# Patient Record
Sex: Female | Born: 1935 | Race: White | Hispanic: No | Smoking: Never smoker
Health system: Southern US, Community
[De-identification: ages and names within clinical notes are randomized; demographics above are authoritative.]

## PROBLEM LIST (undated history)

## (undated) DIAGNOSIS — M1990 Unspecified osteoarthritis, unspecified site: Secondary | ICD-10-CM

## (undated) DIAGNOSIS — E079 Disorder of thyroid, unspecified: Secondary | ICD-10-CM

## (undated) DIAGNOSIS — I10 Essential (primary) hypertension: Secondary | ICD-10-CM

## (undated) DIAGNOSIS — IMO0002 Reserved for concepts with insufficient information to code with codable children: Secondary | ICD-10-CM

## (undated) DIAGNOSIS — G4730 Sleep apnea, unspecified: Secondary | ICD-10-CM

## (undated) HISTORY — PX: ABDOMINAL HYSTERECTOMY: SHX81

## (undated) HISTORY — DX: Reserved for concepts with insufficient information to code with codable children: IMO0002

## (undated) HISTORY — DX: Unspecified osteoarthritis, unspecified site: M19.90

## (undated) HISTORY — DX: Disorder of thyroid, unspecified: E07.9

## (undated) HISTORY — DX: Essential (primary) hypertension: I10

---

## 2004-10-16 ENCOUNTER — Ambulatory Visit: Payer: Self-pay | Admitting: Ophthalmology

## 2004-11-24 ENCOUNTER — Ambulatory Visit: Payer: Self-pay | Admitting: Family Medicine

## 2004-12-01 ENCOUNTER — Ambulatory Visit: Payer: Self-pay

## 2004-12-07 ENCOUNTER — Ambulatory Visit: Payer: Self-pay | Admitting: Ophthalmology

## 2005-09-12 ENCOUNTER — Ambulatory Visit: Payer: Self-pay | Admitting: Family Medicine

## 2008-04-01 ENCOUNTER — Ambulatory Visit: Payer: Self-pay | Admitting: Internal Medicine

## 2008-06-03 ENCOUNTER — Ambulatory Visit: Payer: Self-pay | Admitting: Internal Medicine

## 2008-07-15 ENCOUNTER — Ambulatory Visit: Payer: Self-pay | Admitting: Family

## 2008-07-23 ENCOUNTER — Ambulatory Visit: Payer: Self-pay | Admitting: Internal Medicine

## 2010-12-24 HISTORY — PX: VITRECTOMY: SHX106

## 2011-02-22 ENCOUNTER — Ambulatory Visit (INDEPENDENT_AMBULATORY_CARE_PROVIDER_SITE_OTHER): Payer: Medicare Other | Admitting: Internal Medicine

## 2011-02-22 ENCOUNTER — Encounter: Payer: Self-pay | Admitting: Internal Medicine

## 2011-02-22 VITALS — BP 148/82 | HR 87 | Temp 97.5°F | Resp 16 | Ht 61.0 in | Wt 166.5 lb

## 2011-02-22 DIAGNOSIS — R5383 Other fatigue: Secondary | ICD-10-CM

## 2011-02-22 DIAGNOSIS — E785 Hyperlipidemia, unspecified: Secondary | ICD-10-CM

## 2011-02-22 DIAGNOSIS — E039 Hypothyroidism, unspecified: Secondary | ICD-10-CM

## 2011-02-22 DIAGNOSIS — I10 Essential (primary) hypertension: Secondary | ICD-10-CM | POA: Insufficient documentation

## 2011-02-22 MED ORDER — LEVOTHYROXINE SODIUM 25 MCG PO TABS: 25.0000 ug | ORAL_TABLET | Freq: Every day | ORAL | Status: DC

## 2011-02-22 NOTE — Assessment & Plan Note (Signed)
She is not tolerating current Synthroid dose.  Will decrease dose to 25 mcg and recheck level in 6 weeks

## 2011-02-22 NOTE — Progress Notes (Signed)
  Subjective:    Patient ID: Lauren Morris, female    DOB: May 01, 1932, 77 y.o.   MRN: [id number]  HPI 77 yo female with history of low thyroid and hypertension presents for /fu on chronic issues .  During her annual phsicial 4 months ago she was diagnosed with hypothyroidism and started on replacement therapy with Synthroid 50 mcg.  Repeat TSH was normalized, but she is reporting increased stooling and daily hot flashes since starting the thyroid medication and wonders if it is too strong for her.   Current outpatient prescriptions:levothyroxine (SYNTHROID, LEVOTHROID) 25 MCG tablet, Take 1 tablet (25 mcg total) by mouth daily., Disp: 30 tablet, Rfl: 3;  amLODipine (NORVASC) 5 MG tablet, Take 5 mg by mouth daily.  , Disp: , Rfl: ;  Calcium Carb-Cholecalciferol (CALCIUM 1000 + D PO), Take 1 tablet by mouth daily.  , Disp: , Rfl: ;  Cholecalciferol (VITAMIN D) 1000 UNITS capsule, Take 1,000 Units by mouth daily.  , Disp: , Rfl:  fish oil-omega-3 fatty acids 1000 MG capsule, Take 2 g by mouth daily.  , Disp: , Rfl:    Review of Systems  Constitutional: Negative for fever, chills and unexpected weight change.  HENT: Negative for hearing loss, ear pain, nosebleeds, congestion, sore throat, facial swelling, rhinorrhea, sneezing, mouth sores, trouble swallowing, neck pain, neck stiffness, voice change, postnasal drip, sinus pressure, tinnitus and ear discharge.   Eyes: Positive for pain and redness. Negative for discharge and visual disturbance.  Respiratory: Negative for cough, chest tightness, shortness of breath, wheezing and stridor.   Cardiovascular: Negative for chest pain, palpitations and leg swelling.  Musculoskeletal: Negative for myalgias and arthralgias.  Skin: Negative for color change and rash.  Neurological: Negative for dizziness, weakness, light-headedness and headaches.  Hematological: Negative for adenopathy.     BP 148/82  Pulse 87  Temp(Src) 97.5 F (36.4 C) (Oral)  Resp 16  Ht  5\' 1"  (1.549 m)  Wt 166 lb 8 oz (75.524 kg)  BMI 31.46 kg/m2  SpO2 96%   Objective:   Physical Exam  Constitutional: She is oriented to person, place, and time. She appears well-developed and well-nourished.  HENT:  Mouth/Throat: Oropharynx is clear and moist.  Eyes: EOM are normal. Pupils are equal, round, and reactive to light. No scleral icterus.  Neck: Normal range of motion. Neck supple. No JVD present. No thyromegaly present.  Cardiovascular: Normal rate, regular rhythm, normal heart sounds and intact distal pulses.   Pulmonary/Chest: Effort normal and breath sounds normal.  Abdominal: Soft. Bowel sounds are normal. She exhibits no mass. There is no tenderness.  Musculoskeletal: Normal range of motion. She exhibits no edema.  Lymphadenopathy:    She has no cervical adenopathy.  Neurological: She is alert and oriented to person, place, and time.  Skin: Skin is warm and dry.  Psychiatric: She has a normal mood and affect.          Assessment & Plan:

## 2011-02-22 NOTE — Assessment & Plan Note (Signed)
Elevated today, but hse has been recently treated with steroids for her eye issues.  Will have her check bps at home for the next 2 weeks and increase amlodipine to 10 mg daily if bps are > 130/80

## 2011-02-22 NOTE — Patient Instructions (Addendum)
Measure your bp once daily for one week.  Call results to the office..  If most of your readings are over 130/80,  We will increase your amlodipine dose to 10 mg daily . We are reducing your thyroid pill to 25 mcg daily  And will repeat your thyroid level in about 6 weeks.

## 2011-02-28 ENCOUNTER — Telehealth: Payer: Self-pay | Admitting: Internal Medicine

## 2011-02-28 NOTE — Telephone Encounter (Signed)
Patient called and stated you asked her to increase her amlodipine to 10 mg daily and check her BP for 5 days.  She stated the readings have been:  130/67    140/62   135/68   132/67 and  130/65.  She said she has one more days worth of medication and wanted to know if you want her to continue with the 10 mg daily.  Please advise.

## 2011-03-01 MED ORDER — AMLODIPINE BESYLATE 10 MG PO TABS: 10.0000 mg | ORAL_TABLET | Freq: Every day | ORAL | Status: DC

## 2011-03-01 NOTE — Telephone Encounter (Signed)
Rx has been called in. And patient notified.

## 2011-03-01 NOTE — Telephone Encounter (Signed)
Yes, have her contineu amlodipine 10 mg daily   You can refill #30 with 5 refills

## 2011-04-12 ENCOUNTER — Encounter: Payer: Self-pay | Admitting: Internal Medicine

## 2011-04-12 ENCOUNTER — Ambulatory Visit (INDEPENDENT_AMBULATORY_CARE_PROVIDER_SITE_OTHER): Payer: Medicare Other | Admitting: Internal Medicine

## 2011-04-12 DIAGNOSIS — R609 Edema, unspecified: Secondary | ICD-10-CM

## 2011-04-12 DIAGNOSIS — R5383 Other fatigue: Secondary | ICD-10-CM

## 2011-04-12 DIAGNOSIS — I10 Essential (primary) hypertension: Secondary | ICD-10-CM

## 2011-04-12 DIAGNOSIS — E039 Hypothyroidism, unspecified: Secondary | ICD-10-CM

## 2011-04-12 DIAGNOSIS — R5381 Other malaise: Secondary | ICD-10-CM

## 2011-04-12 DIAGNOSIS — E669 Obesity, unspecified: Secondary | ICD-10-CM

## 2011-04-12 DIAGNOSIS — E785 Hyperlipidemia, unspecified: Secondary | ICD-10-CM

## 2011-04-12 LAB — TSH: TSH: 1.42 u[IU]/mL (ref 0.35–5.50)

## 2011-04-12 LAB — COMPREHENSIVE METABOLIC PANEL
ALT: 38 U/L — ABNORMAL HIGH (ref 0–35)
BUN: 13 mg/dL (ref 6–23)
CO2: 29 mEq/L (ref 19–32)
Calcium: 9.4 mg/dL (ref 8.4–10.5)
Creatinine, Ser: 0.7 mg/dL (ref 0.4–1.2)
GFR: 86.28 mL/min (ref >60.00)
Total Bilirubin: 0.5 mg/dL (ref 0.3–1.2)

## 2011-04-12 LAB — LIPID PANEL
HDL: 36.1 mg/dL — ABNORMAL LOW (ref >39.00)
Triglycerides: 174 mg/dL — ABNORMAL HIGH (ref 0.0–149.0)
VLDL: 34.8 mg/dL (ref 0.0–40.0)

## 2011-04-12 NOTE — Patient Instructions (Signed)
We will recheck your thyroid  And cholesterol today.  Your blood pressure is excellent.  You may use the furosemide as needed (not more than once daily) for fluid retention.  It is a diuretic

## 2011-04-13 ENCOUNTER — Telehealth: Payer: Self-pay | Admitting: Internal Medicine

## 2011-04-13 MED ORDER — FUROSEMIDE 20 MG PO TABS: 20.0000 mg | ORAL_TABLET | Freq: Every day | ORAL | Status: DC

## 2011-04-13 NOTE — Telephone Encounter (Signed)
Left pt VM that Rx was sent in today and I resent RX

## 2011-04-13 NOTE — Telephone Encounter (Signed)
PT CALLED IN  CHECK ON RX THAT WAS TO BE CALLED IN YESTERDAY BY DR TULLO.   PT WOULD LIKE TO PICK BEFORE 1 TODAY [facility]

## 2011-04-13 NOTE — Telephone Encounter (Signed)
My note indicates that furosemide was e prescribed to her pharmacy.  Can you call it in?  20 mg one tablet daily as needed for fluid retention.  #30 3 refills

## 2011-04-13 NOTE — Telephone Encounter (Signed)
Patient says that she was to have a rx called in yesterday at her appt. I tried calling patient back to find out what rx, but got no answer.

## 2011-04-13 NOTE — Telephone Encounter (Signed)
Patient states pharmacy does not have Rx °

## 2011-04-13 NOTE — Telephone Encounter (Signed)
Rx sent to pharmacy   

## 2011-04-15 ENCOUNTER — Encounter: Payer: Self-pay | Admitting: Internal Medicine

## 2011-04-15 DIAGNOSIS — E785 Hyperlipidemia, unspecified: Secondary | ICD-10-CM | POA: Insufficient documentation

## 2011-04-15 MED ORDER — FUROSEMIDE 20 MG PO TABS: 20.0000 mg | ORAL_TABLET | Freq: Every day | ORAL | Status: DC | PRN

## 2011-04-15 NOTE — Assessment & Plan Note (Signed)
currently well controlled.  No changes today

## 2011-04-15 NOTE — Progress Notes (Signed)
  Subjective:    Patient ID: Lauren Morris, female    DOB: [birth year], 77 y.o.   MRN: [id number]  HPI  77 yr old white female returns for recheck after reducing her thyroid medication dose due to increased defecation and hot flahses in the setting of supratherapeutic TSH.  She contineus to have epsiodes of hot flashes, though not as frequently,  And her bowels have returned to normal.  Denies tremor, unintentional weight loss, tachycardia and hair loss.   Review of Systems  Constitutional: Negative for fever, chills and unexpected weight change.  HENT: Negative for hearing loss, ear pain, nosebleeds, congestion, sore throat, facial swelling, rhinorrhea, sneezing, mouth sores, trouble swallowing, neck pain, neck stiffness, voice change, postnasal drip, sinus pressure, tinnitus and ear discharge.   Eyes: Negative for pain, discharge, redness and visual disturbance.  Respiratory: Negative for cough, chest tightness, shortness of breath, wheezing and stridor.   Cardiovascular: Negative for chest pain, palpitations and leg swelling.  Musculoskeletal: Negative for myalgias and arthralgias.  Skin: Negative for color change and rash.  Neurological: Negative for dizziness, weakness, light-headedness and headaches.  Hematological: Negative for adenopathy.       Objective:   Physical Exam  Constitutional: She is oriented to person, place, and time. She appears well-developed and well-nourished.  HENT:  Mouth/Throat: Oropharynx is clear and moist.  Eyes: EOM are normal. Pupils are equal, round, and reactive to light. No scleral icterus.  Neck: Normal range of motion. Neck supple. No JVD present. No thyromegaly present.  Cardiovascular: Normal rate, regular rhythm, normal heart sounds and intact distal pulses.   Pulmonary/Chest: Effort normal and breath sounds normal.  Abdominal: Soft. Bowel sounds are normal. She exhibits no mass. There is no tenderness.  Musculoskeletal: Normal range of motion. She  exhibits no edema.  Lymphadenopathy:    She has no cervical adenopathy.  Neurological: She is alert and oriented to person, place, and time.  Skin: Skin is warm and dry.  Psychiatric: She has a normal mood and affect.          Assessment & Plan:

## 2011-04-15 NOTE — Assessment & Plan Note (Signed)
Repeat TSH is normalized and she feels better on lower dose of generic Synthroid

## 2011-04-15 NOTE — Assessment & Plan Note (Signed)
LDL is within goal.  triglycerides are mildly elevated, which I have addressed with recommendations for weight loss and exercise.  Recommended low glycemic index diet.

## 2011-06-25 ENCOUNTER — Other Ambulatory Visit: Payer: Self-pay | Admitting: *Deleted

## 2011-06-25 MED ORDER — LEVOTHYROXINE SODIUM 25 MCG PO TABS: 25.0000 ug | ORAL_TABLET | Freq: Every day | ORAL | Status: DC

## 2011-06-25 NOTE — Telephone Encounter (Signed)
Faxed request from [facility], last filled 05/21/11.

## 2011-08-06 DIAGNOSIS — H35369 Drusen (degenerative) of macula, unspecified eye: Secondary | ICD-10-CM | POA: Diagnosis not present

## 2011-08-06 DIAGNOSIS — H35379 Puckering of macula, unspecified eye: Secondary | ICD-10-CM | POA: Diagnosis not present

## 2011-08-06 DIAGNOSIS — H43819 Vitreous degeneration, unspecified eye: Secondary | ICD-10-CM | POA: Diagnosis not present

## 2011-08-16 DIAGNOSIS — H409 Unspecified glaucoma: Secondary | ICD-10-CM | POA: Diagnosis not present

## 2011-08-16 DIAGNOSIS — H4011X0 Primary open-angle glaucoma, stage unspecified: Secondary | ICD-10-CM | POA: Diagnosis not present

## 2011-08-22 ENCOUNTER — Telehealth: Payer: Self-pay | Admitting: Internal Medicine

## 2011-08-22 MED ORDER — LEVOTHYROXINE SODIUM 25 MCG PO TABS: 25.0000 ug | ORAL_TABLET | Freq: Every day | ORAL | Status: DC

## 2011-08-22 NOTE — Telephone Encounter (Signed)
Rx has been called in  

## 2011-08-22 NOTE — Telephone Encounter (Signed)
[phone] Pt called to get refill on her levothyroxine 25mg  1 daily [facility] in [place]

## 2011-08-28 ENCOUNTER — Telehealth: Payer: Self-pay | Admitting: Internal Medicine

## 2011-08-28 MED ORDER — AMLODIPINE BESYLATE 10 MG PO TABS: 10.0000 mg | ORAL_TABLET | Freq: Every day | ORAL | Status: DC

## 2011-08-28 NOTE — Telephone Encounter (Signed)
Patient needs her amlodopine called into [facility].

## 2011-08-28 NOTE — Telephone Encounter (Signed)
Rx has been called in  

## 2011-08-30 DIAGNOSIS — J209 Acute bronchitis, unspecified: Secondary | ICD-10-CM | POA: Diagnosis not present

## 2011-09-05 ENCOUNTER — Encounter: Payer: Self-pay | Admitting: Internal Medicine

## 2011-09-05 ENCOUNTER — Ambulatory Visit (INDEPENDENT_AMBULATORY_CARE_PROVIDER_SITE_OTHER): Payer: Medicare Other | Admitting: Internal Medicine

## 2011-09-05 VITALS — BP 132/64 | HR 76 | Temp 97.6°F | Resp 16 | Ht 62.0 in | Wt 188.5 lb

## 2011-09-05 DIAGNOSIS — I10 Essential (primary) hypertension: Secondary | ICD-10-CM

## 2011-09-05 DIAGNOSIS — R609 Edema, unspecified: Secondary | ICD-10-CM

## 2011-09-05 DIAGNOSIS — E785 Hyperlipidemia, unspecified: Secondary | ICD-10-CM

## 2011-09-05 DIAGNOSIS — E039 Hypothyroidism, unspecified: Secondary | ICD-10-CM | POA: Diagnosis not present

## 2011-09-05 DIAGNOSIS — Z79899 Other long term (current) drug therapy: Secondary | ICD-10-CM

## 2011-09-05 DIAGNOSIS — J0100 Acute maxillary sinusitis, unspecified: Secondary | ICD-10-CM

## 2011-09-05 MED ORDER — FUROSEMIDE 20 MG PO TABS: 20.0000 mg | ORAL_TABLET | Freq: Every day | ORAL | Status: DC | PRN

## 2011-09-05 NOTE — Patient Instructions (Addendum)
Your symptoms are more likely from a viral infection(not allergies)   that caused a bacterial sinus infection and bronchitis   Use benadryl (dipenhydramine)  25 mg every 8 hours,  And sudafed PE  10 to 20 mg every 8  Hours until 3 PM ( last dose at 3 Pm so it does not keep you awake at night) for a few days. Until your symptoms improve   Lauren Morris will work on the runny nose better than Allegra if the cause is a virus   Sudafed PE takes care of the congestion.   You may also try simply Saline nasal spray to flush your sinuses twice daily  (AM and PM)  Finish the levaquin

## 2011-09-05 NOTE — Progress Notes (Signed)
Subjective:    Patient ID: Lauren Morris, female    DOB: 04/25/1932, 78 y.o.   MRN: [id number]  HPI   Lauren Morris is a 78 yr old white female with a history of hypertension and  hypothyroidisim who presents for follow up.PShe was treated for sinusitis/ bronchitis last Thursday by Urgent Care With levaquin,  Prometh/DM cough syrup but has taken the recommended decongestants because her pharmacist told her it would raise her bp.  She feels generally better but conitnues  To have a cough and sinus pressure in all 4 areas.  Past Medical History  Diagnosis Date  . Glaucoma   . Hypertension   . Thyroid disease    Current Outpatient Prescriptions on File Prior to Visit  Medication Sig Dispense Refill  . amLODipine (NORVASC) 10 MG tablet Take 1 tablet (10 mg total) by mouth daily.  30 tablet  5  . Calcium Carb-Cholecalciferol (CALCIUM 1000 + D PO) Take 1 tablet by mouth daily.        . Cholecalciferol (VITAMIN D) 1000 UNITS capsule Take 1,000 Units by mouth daily.        . fish oil-omega-3 fatty acids 1000 MG capsule Take 2 g by mouth daily.        Marland Kitchen levothyroxine (SYNTHROID, LEVOTHROID) 25 MCG tablet Take 1 tablet (25 mcg total) by mouth daily.  30 tablet  4    Review of Systems  Constitutional: Negative for fever, chills and unexpected weight change.  HENT: Positive for sinus pressure. Negative for hearing loss, ear pain, nosebleeds, congestion, sore throat, facial swelling, rhinorrhea, sneezing, mouth sores, trouble swallowing, neck pain, neck stiffness, voice change, postnasal drip, tinnitus and ear discharge.   Eyes: Negative for pain, discharge, redness and visual disturbance.  Respiratory: Negative for cough, chest tightness, shortness of breath, wheezing and stridor.   Cardiovascular: Negative for chest pain, palpitations and leg swelling.  Musculoskeletal: Negative for myalgias and arthralgias.  Skin: Negative for color change and rash.  Neurological: Negative for dizziness, weakness,  light-headedness and headaches.  Hematological: Negative for adenopathy.       Objective:   Physical Exam  Constitutional: She is oriented to person, place, and time. She appears well-developed and well-nourished.  HENT:  Mouth/Throat: Oropharynx is clear and moist.  Eyes: EOM are normal. Pupils are equal, round, and reactive to light. No scleral icterus.  Neck: Normal range of motion. Neck supple. No JVD present. No thyromegaly present.  Cardiovascular: Normal rate, regular rhythm, normal heart sounds and intact distal pulses.   Pulmonary/Chest: Effort normal and breath sounds normal.  Abdominal: Soft. Bowel sounds are normal. She exhibits no mass. There is no tenderness.  Musculoskeletal: Normal range of motion. She exhibits no edema.  Lymphadenopathy:    She has no cervical adenopathy.  Neurological: She is alert and oriented to person, place, and time.  Skin: Skin is warm and dry.  Psychiatric: She has a normal mood and affect.      Assessment & Plan:   Sinusitis: recommended adding sudafed PE, Benadryl and simply saline for relief of symptoms .  Hypertension Well controlled on current medications.  No changes today.  Hyperlipidemia LDL goal <100 LDL was 62 last visit on current therapy.  LFTS needed   Hypothyroidism TSh was therapeutic on current synthroid   dose 6 months ago    Updated Medication List Outpatient Encounter Prescriptions as of 09/05/2011  Medication Sig Dispense Refill  . amLODipine (NORVASC) 10 MG tablet Take 1 tablet (10 mg  total) by mouth daily.  30 tablet  5  . Calcium Carb-Cholecalciferol (CALCIUM 1000 + D PO) Take 1 tablet by mouth daily.        . Cholecalciferol (VITAMIN D) 1000 UNITS capsule Take 1,000 Units by mouth daily.        . fish oil-omega-3 fatty acids 1000 MG capsule Take 2 g by mouth daily.        . furosemide (LASIX) 20 MG tablet Take 1 tablet (20 mg total) by mouth daily as needed (for fluid retention).  30 tablet  11  .  levothyroxine (SYNTHROID, LEVOTHROID) 25 MCG tablet Take 1 tablet (25 mcg total) by mouth daily.  30 tablet  4  . DISCONTD: furosemide (LASIX) 20 MG tablet Take 1 tablet (20 mg total) by mouth daily as needed (for fluid retention).  30 tablet  11  . DISCONTD: furosemide (LASIX) 20 MG tablet Take 1 tablet (20 mg total) by mouth daily as needed (for fluid retention).  30 tablet  11  . DISCONTD: furosemide (LASIX) 20 MG tablet Take 1 tablet (20 mg total) by mouth daily.  30 tablet  3

## 2011-09-05 NOTE — Assessment & Plan Note (Addendum)
LDL was 62 last visit on current therapy.  LFTS needed

## 2011-09-05 NOTE — Assessment & Plan Note (Signed)
Well controlled on current medications.  No changes today. 

## 2011-09-05 NOTE — Assessment & Plan Note (Signed)
TSh was therapeutic on current synthroid   dose 6 months ago

## 2011-09-06 ENCOUNTER — Telehealth: Payer: Self-pay | Admitting: Internal Medicine

## 2011-09-06 DIAGNOSIS — Z1239 Encounter for other screening for malignant neoplasm of breast: Secondary | ICD-10-CM

## 2011-09-06 NOTE — Telephone Encounter (Signed)
Thank,  rx on printer

## 2011-09-06 NOTE — Telephone Encounter (Signed)
Patient notified of order, she will pick up.

## 2011-09-06 NOTE — Telephone Encounter (Signed)
[phone] Pt called to let you know that her last mammogram was 12/14/10 @ [facility] She needs order for mammogram this year She will be going 7/6-7/13

## 2011-10-08 ENCOUNTER — Encounter: Payer: Self-pay | Admitting: Internal Medicine

## 2011-10-08 ENCOUNTER — Other Ambulatory Visit: Payer: Medicare Other

## 2011-10-22 ENCOUNTER — Other Ambulatory Visit (INDEPENDENT_AMBULATORY_CARE_PROVIDER_SITE_OTHER): Payer: Medicare Other | Admitting: *Deleted

## 2011-10-22 DIAGNOSIS — E785 Hyperlipidemia, unspecified: Secondary | ICD-10-CM

## 2011-10-22 DIAGNOSIS — Z79899 Other long term (current) drug therapy: Secondary | ICD-10-CM

## 2011-10-22 DIAGNOSIS — E039 Hypothyroidism, unspecified: Secondary | ICD-10-CM

## 2011-10-22 LAB — LIPID PANEL
Cholesterol: 136 mg/dL (ref 0–200)
HDL: 36 mg/dL — ABNORMAL LOW (ref >39.00)
LDL Cholesterol: 63 mg/dL (ref 0–99)
Total CHOL/HDL Ratio: 4
Triglycerides: 186 mg/dL — ABNORMAL HIGH (ref 0.0–149.0)
VLDL: 37.2 mg/dL (ref 0.0–40.0)

## 2011-10-23 LAB — COMPLETE METABOLIC PANEL WITH GFR
ALT: 33 U/L (ref 0–35)
AST: 32 U/L (ref 0–37)
Alkaline Phosphatase: 79 U/L (ref 39–117)
BUN: 15 mg/dL (ref 6–23)
Calcium: 9.9 mg/dL (ref 8.4–10.5)
Creat: 0.72 mg/dL (ref 0.50–1.10)
Total Bilirubin: 0.5 mg/dL (ref 0.3–1.2)

## 2011-12-20 DIAGNOSIS — Z1231 Encounter for screening mammogram for malignant neoplasm of breast: Secondary | ICD-10-CM | POA: Diagnosis not present

## 2011-12-26 ENCOUNTER — Other Ambulatory Visit: Payer: Self-pay | Admitting: *Deleted

## 2011-12-26 MED ORDER — LEVOTHYROXINE SODIUM 25 MCG PO TABS: 25.0000 ug | ORAL_TABLET | Freq: Every day | ORAL | Status: DC

## 2012-01-22 ENCOUNTER — Encounter: Payer: Self-pay | Admitting: Internal Medicine

## 2012-01-29 ENCOUNTER — Other Ambulatory Visit: Payer: Self-pay | Admitting: *Deleted

## 2012-01-29 MED ORDER — AMLODIPINE BESYLATE 10 MG PO TABS: 10.0000 mg | ORAL_TABLET | Freq: Every day | ORAL | Status: DC

## 2012-02-14 DIAGNOSIS — H409 Unspecified glaucoma: Secondary | ICD-10-CM | POA: Diagnosis not present

## 2012-02-14 DIAGNOSIS — H4011X0 Primary open-angle glaucoma, stage unspecified: Secondary | ICD-10-CM | POA: Diagnosis not present

## 2012-03-07 ENCOUNTER — Encounter: Payer: Medicare Other | Admitting: Internal Medicine

## 2012-03-11 ENCOUNTER — Encounter: Payer: Self-pay | Admitting: Internal Medicine

## 2012-03-11 ENCOUNTER — Ambulatory Visit (INDEPENDENT_AMBULATORY_CARE_PROVIDER_SITE_OTHER): Payer: Medicare Other | Admitting: Internal Medicine

## 2012-03-11 VITALS — BP 130/68 | HR 80 | Temp 98.1°F | Resp 16 | Wt 183.8 lb

## 2012-03-11 DIAGNOSIS — Z0000 Encounter for general adult medical examination without abnormal findings: Secondary | ICD-10-CM

## 2012-03-11 DIAGNOSIS — Z1239 Encounter for other screening for malignant neoplasm of breast: Secondary | ICD-10-CM | POA: Diagnosis not present

## 2012-03-11 DIAGNOSIS — Z7189 Other specified counseling: Secondary | ICD-10-CM | POA: Insufficient documentation

## 2012-03-11 NOTE — Assessment & Plan Note (Signed)
Breast exam was done today.  She is up to date on mammograms.

## 2012-03-11 NOTE — Patient Instructions (Addendum)
We did your annual Medicare Wellness exam today.  Please return in December for bloodwork (thyroid and liver/kidney function).  You do not need to fast.   I am sending you home with a stool test to check for blood i nyour stools.  We will do this annually until your next colonoscopy is due,

## 2012-03-11 NOTE — Progress Notes (Signed)
Patient ID: Lauren Morris, female   DOB: 10-31-1931, 78 y.o.   MRN: [id number] The patient is here for annual Medicare wellness examination and management of other chronic and acute problems.   The risk factors are reflected in the social history.  The roster of all physicians providing medical care to patient - is listed in the Snapshot section of the chart.  Activities of daily living:  The patient is 100% independent in all ADLs: dressing, toileting, feeding as well as independent mobility  Home safety : The patient has smoke detectors in the home. They wear seatbelts.  There are no firearms at home. There is no violence in the home.   There is no risks for hepatitis, STDs or HIV. There is no   history of blood transfusion. They have no travel history to infectious disease endemic areas of the world.  The patient has seen their dentist in the last six month. They have seen their eye doctor in the last year. They admit to slight hearing difficulty with regard to whispered voices and some television programs.  They have deferred audiologic testing in the last year.  They do not  have excessive sun exposure. Discussed the need for sun protection: hats, long sleeves and use of sunscreen if there is significant sun exposure.   Diet: the importance of a healthy diet is discussed. They do have a healthy diet.  The benefits of regular aerobic exercise were discussed. She walks 4 times per week ,  20 minutes.   Depression screen: there are no signs or vegative symptoms of depression- irritability, change in appetite, anhedonia, sadness/tearfullness.  Cognitive assessment: the patient manages all their financial and personal affairs and is actively engaged. They could relate day,date,year and events; recalled 2/3 objects at 3 minutes; performed clock-face test normally.  The following portions of the patient's history were reviewed and updated as appropriate: allergies, current medications, past family  history, past medical history,  past surgical history, past social history  and problem list.  Visual acuity was not assessed per patient preference since she has regular follow up with her ophthalmologist. Hearing and body mass index were assessed and reviewed.   During the course of the visit the patient was educated and counseled about appropriate screening and preventive services including : fall prevention , diabetes screening, nutrition counseling, colorectal cancer screening, and recommended immunizations.    Objective: BP 130/68  Pulse 80  Temp 98.1 F (36.7 C) (Oral)  Resp 16  Wt 183 lb 12 oz (83.348 kg)  SpO2 95%  General appearance: alert, cooperative and appears stated age Ears: normal TM's and external ear canals both ears Throat: lips, mucosa, and tongue normal; teeth and gums normal Neck: no adenopathy, no carotid bruit, supple, symmetrical, trachea midline and thyroid not enlarged, symmetric, no tenderness/mass/nodules Back: symmetric, no curvature. ROM normal. No CVA tenderness. Lungs: clear to auscultation bilaterally Breast: normal , nontender, symmetric Heart: regular rate and rhythm, S1, S2 normal, no murmur, click, rub or gallop Abdomen: soft, non-tender; bowel sounds normal; no masses,  no organomegaly Pulses: 2+ and symmetric Skin: Skin color, texture, turgor normal. No rashes or lesions Lymph nodes: Cervical, supraclavicular, and axillary nodes normal.

## 2012-04-07 DIAGNOSIS — Z23 Encounter for immunization: Secondary | ICD-10-CM | POA: Diagnosis not present

## 2012-04-17 ENCOUNTER — Other Ambulatory Visit: Payer: Medicare Other

## 2012-04-17 LAB — FECAL OCCULT BLOOD, IMMUNOCHEMICAL: Fecal Occult Bld: NEGATIVE

## 2012-04-19 DIAGNOSIS — R0982 Postnasal drip: Secondary | ICD-10-CM | POA: Diagnosis not present

## 2012-05-23 ENCOUNTER — Other Ambulatory Visit: Payer: Self-pay

## 2012-05-23 DIAGNOSIS — R609 Edema, unspecified: Secondary | ICD-10-CM

## 2012-05-23 MED ORDER — AMLODIPINE BESYLATE 10 MG PO TABS: 10.0000 mg | ORAL_TABLET | Freq: Every day | ORAL | Status: DC

## 2012-05-23 MED ORDER — FUROSEMIDE 20 MG PO TABS: 20.0000 mg | ORAL_TABLET | Freq: Every day | ORAL | Status: DC | PRN

## 2012-05-23 MED ORDER — LEVOTHYROXINE SODIUM 25 MCG PO TABS: 25.0000 ug | ORAL_TABLET | Freq: Every day | ORAL | Status: DC

## 2012-05-23 NOTE — Telephone Encounter (Signed)
Patient called request that her Rx be transfered to [facility] due to insurance and it had to be done by 05/25/12.

## 2012-05-30 ENCOUNTER — Telehealth: Payer: Self-pay | Admitting: *Deleted

## 2012-05-30 DIAGNOSIS — E039 Hypothyroidism, unspecified: Secondary | ICD-10-CM

## 2012-05-30 DIAGNOSIS — E785 Hyperlipidemia, unspecified: Secondary | ICD-10-CM

## 2012-05-30 DIAGNOSIS — Z79899 Other long term (current) drug therapy: Secondary | ICD-10-CM

## 2012-05-30 NOTE — Addendum Note (Signed)
Addended by: Sherlene Shams on: 05/30/2012 05:09 PM   Modules accepted: Orders

## 2012-05-30 NOTE — Telephone Encounter (Signed)
This patient has labs (Monday, Dec. 9th) would labs and diagnostic code would you like for this patient?

## 2012-05-30 NOTE — Telephone Encounter (Signed)
cmet fasting lipids and tsh  i will order

## 2012-06-02 ENCOUNTER — Other Ambulatory Visit (INDEPENDENT_AMBULATORY_CARE_PROVIDER_SITE_OTHER): Payer: Medicare Other

## 2012-06-02 DIAGNOSIS — E785 Hyperlipidemia, unspecified: Secondary | ICD-10-CM | POA: Diagnosis not present

## 2012-06-02 DIAGNOSIS — Z79899 Other long term (current) drug therapy: Secondary | ICD-10-CM | POA: Diagnosis not present

## 2012-06-02 DIAGNOSIS — E039 Hypothyroidism, unspecified: Secondary | ICD-10-CM

## 2012-06-02 LAB — COMPREHENSIVE METABOLIC PANEL
AST: 26 U/L (ref 0–37)
BUN: 14 mg/dL (ref 6–23)
Calcium: 9 mg/dL (ref 8.4–10.5)
Chloride: 103 mEq/L (ref 96–112)
Creatinine, Ser: 0.8 mg/dL (ref 0.4–1.2)
GFR: 72.69 mL/min (ref >60.00)

## 2012-06-02 LAB — LIPID PANEL
HDL: 28 mg/dL — ABNORMAL LOW (ref >39.00)
Total CHOL/HDL Ratio: 5
Triglycerides: 145 mg/dL (ref 0.0–149.0)

## 2012-07-04 LAB — HM DIABETES EYE EXAM: HM Diabetic Eye Exam: NORMAL

## 2012-07-14 ENCOUNTER — Ambulatory Visit: Payer: Medicare Other | Admitting: Internal Medicine

## 2012-07-17 ENCOUNTER — Ambulatory Visit: Payer: Medicare Other | Admitting: Internal Medicine

## 2012-07-23 ENCOUNTER — Ambulatory Visit: Payer: Medicare Other | Admitting: Internal Medicine

## 2012-07-31 DIAGNOSIS — H4011X0 Primary open-angle glaucoma, stage unspecified: Secondary | ICD-10-CM | POA: Diagnosis not present

## 2012-07-31 DIAGNOSIS — H353190 Nonexudative age-related macular degeneration, unspecified eye, stage unspecified: Secondary | ICD-10-CM | POA: Diagnosis not present

## 2012-08-04 ENCOUNTER — Encounter: Payer: Self-pay | Admitting: Internal Medicine

## 2012-08-04 ENCOUNTER — Ambulatory Visit (INDEPENDENT_AMBULATORY_CARE_PROVIDER_SITE_OTHER): Payer: Medicare Other | Admitting: Internal Medicine

## 2012-08-04 VITALS — BP 128/60 | HR 70 | Temp 97.4°F | Resp 16 | Wt 185.2 lb

## 2012-08-04 DIAGNOSIS — E8881 Metabolic syndrome: Secondary | ICD-10-CM | POA: Diagnosis not present

## 2012-08-04 DIAGNOSIS — R739 Hyperglycemia, unspecified: Secondary | ICD-10-CM

## 2012-08-04 DIAGNOSIS — R7309 Other abnormal glucose: Secondary | ICD-10-CM | POA: Diagnosis not present

## 2012-08-04 DIAGNOSIS — E1169 Type 2 diabetes mellitus with other specified complication: Secondary | ICD-10-CM | POA: Insufficient documentation

## 2012-08-04 DIAGNOSIS — E119 Type 2 diabetes mellitus without complications: Secondary | ICD-10-CM | POA: Insufficient documentation

## 2012-08-04 DIAGNOSIS — E88819 Insulin resistance, unspecified: Secondary | ICD-10-CM

## 2012-08-04 NOTE — Progress Notes (Addendum)
Patient ID: Lauren Morris, female   DOB: 06/21/1932, 79 y.o.   MRN: [id number]  Patient Active Problem List  Diagnosis  . Hypertension  . Hypothyroidism  . Hyperlipidemia LDL goal <100  . Routine general medical examination at a health care facility  . Insulin resistance    Subjective:  CC:   Chief Complaint  Patient presents with  . Follow-up    Blood Work     HPI:   Lauren Morris a 79 y.o. female who presents for Follow up on recent abnormal labs.  Fasting glucose of 124 ,  She has had her annual eye exam last week by [facility].  Pressure was up. New drop prescirbed.  Return on feb 20th  Concerned about starting medications for diabetes and is motivated to change her diet.  Denies frequent infections, polyuria, uninentional weight loss.    Past Medical History  Diagnosis Date  . Glaucoma   . Hypertension   . Thyroid disease   . Cyst     hx o on right breast  . DJD (degenerative joint disease)     right knee    Past Surgical History  Procedure Laterality Date  . Vitrectomy  July 2012    right eye with membrane peel [facility], [place])  . Abdominal hysterectomy         The following portions of the patient's history were reviewed and updated as appropriate: Allergies, current medications, and problem list.    Review of Systems:   12 Pt  review of systems was negative except those addressed in the HPI,     History   Social History  . Marital Status: Widowed    Spouse Name: N/A    Number of Children: N/A  . Years of Education: N/A   Occupational History  . Not on file.   Social History Main Topics  . Smoking status: Never Smoker   . Smokeless tobacco: Never Used  . Alcohol Use: No  . Drug Use: No  . Sexually Active: Not on file   Other Topics Concern  . Not on file   Social History Narrative  . No narrative on file    Objective:  BP 128/60  Pulse 70  Temp(Src) 97.4 F (36.3 C) (Oral)  Resp 16  Wt 185 lb 4 oz (84.029 kg)  BMI 33.87 kg/m2   SpO2 95%  General appearance: alert, cooperative and appears stated age Lungs: clear to auscultation bilaterally Heart: regular rate and rhythm, S1, S2 normal, no murmur, click, rub or gallop Abdomen: soft, non-tender; bowel sounds normal; no masses,  no organomegaly Pulses: 2+ and symmetric  Assessment and Plan:  Insulin resistance Low GI diet discussed along with need for regular exercise. Return in 3 months  A total of 25 minutes of face to face time , more than half of which was spent in in counselling patient.   Updated Medication List Outpatient Encounter Prescriptions as of 08/04/2012  Medication Sig Dispense Refill  . amLODipine (NORVASC) 10 MG tablet Take 1 tablet (10 mg total) by mouth daily.  30 tablet  5  . Calcium Carb-Cholecalciferol (CALCIUM 1000 + D PO) Take 1 tablet by mouth daily.        . Cholecalciferol (VITAMIN D) 1000 UNITS capsule Take 1,000 Units by mouth daily.        . fish oil-omega-3 fatty acids 1000 MG capsule Take 2 g by mouth daily.        . furosemide (LASIX) 20 MG tablet  Take 1 tablet (20 mg total) by mouth daily as needed (for fluid retention).  30 tablet  11  . levothyroxine (SYNTHROID, LEVOTHROID) 25 MCG tablet Take 1 tablet (25 mcg total) by mouth daily.  30 tablet  4  . timolol (BETIMOL) 0.25 % ophthalmic solution Place 1-2 drops into both eyes 2 (two) times daily.      . travoprost, benzalkonium, (TRAVATAN) 0.004 % ophthalmic solution Place 1 drop into both eyes at bedtime.       No facility-administered encounter medications on file as of 08/04/2012.     Orders Placed This Encounter  Procedures  . Hemoglobin A1c  . HM DIABETES EYE EXAM    Return in about 3 months (around 11/01/2012).

## 2012-08-04 NOTE — Assessment & Plan Note (Signed)
Low GI diet discussed along with need for regular exercise. Return in 3 months

## 2012-08-04 NOTE — Patient Instructions (Addendum)
You need to  Watch the sugar content of your foods since you are bordring on diabetes.    This is  my version of a  "Low GI"  Diet:  It is not ultra low carb, but will still lower your blood sugars and allow you to lose 5 to 10 lbs per month if you follow it carefully. All of the foods can be found at grocery stores and in bulk at [employer].  The Atkins protein bars and shakes are available in more varieties at [employer], [employer] and [employer].     7 AM Breakfast:  Low carbohydrate Protein  Shakes (I recommend the EAS AdvantEdge "Carb Control" shakes  Or the low carb shakes by Atkins.   Both are available everywhere:  In  cases at [employer]Js  Or in 4 packs at grocery stores and pharmacies  2.5 carbs  (Alternative is  a toasted Arnold's Sandwhich Thin w/ peanut butter, a "Bagel Thin" with cream cheese and salmon) or  a scrambled egg burrito made with a low carb tortilla .  Avoid corn based cereal and bananas, as well as instant oatmeal too unless you are cooking the old fashioned kind that takes 30-40 minutes to prepare.  the rest is overly processed, has minimal fiber, and is loaded with carbohydrates! Also try a hardboiled egg and a cup of Dannon Light n fit greek yogurt    10 AM: Protein bar by Atkins (the snack size, under 200 cal).  There are many varieties , available widely again or in bulk in limited varieties at BJs)  Other so called "protein bars" tend to be loaded with carbohydrates.  Remember, in food advertising, the word "energy" is synonymous for " carbohydrate."Or A CHEESE STICK  Or a handful of nuts   Lunch: sandwich of [place], (or any lunchmeat, grilled meat or canned tuna), fresh avocado, mayonnaise  and cheese on a lower carbohydrate pita bread, flatbread, or tortilla . Ok to use regular mayonnaise. The bread is the only source or carbohydrate that can be decreased (Joseph's makes a pita bread and a flat bread that are 50 cal and 4 net carbs ; Toufayan makes a low carb flatbread that's 100 cal  and 9 net carbs  and  [place] makes a low carb whole wheat tortilla  That is 210 cal and 6 net carbs) f you use regular bread,  do not have pretzels crackers or potato chips with it .  3 PM:  Mid day :  Another protein bar,  Or a  cheese stick (100 cal, 0 carbs),  Or 1 ounce of  almonds, walnuts, pistachios, pecans, peanuts,  Macadamia nuts. Or a Dannon light n Fit greek yogurt, 80 cal 8 net carbs . Avoid "granola"; the dried cranberries and raisins are loaded with carbohydrates. Mixed nuts ok if no raisins or cranberries or dried fruit.      6 PM  Dinner:  "mean and green:"  Meat/chicken/fish or a high protein legume; , with a green salad, and a low GI  Veggie (broccoli, cauliflower, green beans, spinach, brussel sprouts. Lima beans) : Avoid "Low fat dressings, as well as Reyne Dumas and [address]! They are loaded with sugar! Instead use ranch, vinagrette,  Blue cheese, etc.  There is a low carb pasta by Dreamfield's available at [facility] that is acceptable and tastes great. Try Michel Angel's chicken piccata over low carb pasta. The chicken dish is 0 carbs, and can be found in frozen section at  BJs and [employer]. Also try [employer]" (pulled pork, no sauce,  0 carbs) and his pot roast.   both are in the refrigerated section at BJs   Dreamfield's makes a low carb pasta only 5 g/serving.  Available at all grocery stores,  And tastes like normal pasta  9 PM snack : Breyer's "low carb" fudgsicle or  ice cream bar (Carb Smart line), or  Weight Watcher's ice cream bar , or another "no sugar added" ice cream;a serving of fresh berries/cherries with whipped cream (Avoid bananas, pineapple, grapes  and watermelon on a regular basis because they are high in sugar)   Remember that snack Substitutions should be less than 10 carbs per serving and meals should  < 20 carbs. Remember to subtract fiber grams and sugar alcohols to get the "net carbs."

## 2012-08-05 LAB — HEMOGLOBIN A1C: Hgb A1c MFr Bld: 6.2 % (ref 4.6–6.5)

## 2012-08-14 DIAGNOSIS — H4011X0 Primary open-angle glaucoma, stage unspecified: Secondary | ICD-10-CM | POA: Diagnosis not present

## 2012-08-14 DIAGNOSIS — H409 Unspecified glaucoma: Secondary | ICD-10-CM | POA: Diagnosis not present

## 2012-10-24 ENCOUNTER — Other Ambulatory Visit: Payer: Self-pay | Admitting: Internal Medicine

## 2012-11-12 ENCOUNTER — Ambulatory Visit (INDEPENDENT_AMBULATORY_CARE_PROVIDER_SITE_OTHER): Payer: Medicare Other | Admitting: Internal Medicine

## 2012-11-12 ENCOUNTER — Encounter: Payer: Self-pay | Admitting: Internal Medicine

## 2012-11-12 VITALS — BP 124/60 | HR 66 | Temp 97.5°F | Resp 16 | Wt 185.5 lb

## 2012-11-12 DIAGNOSIS — Z1239 Encounter for other screening for malignant neoplasm of breast: Secondary | ICD-10-CM

## 2012-11-12 DIAGNOSIS — E785 Hyperlipidemia, unspecified: Secondary | ICD-10-CM | POA: Diagnosis not present

## 2012-11-12 DIAGNOSIS — E88819 Insulin resistance, unspecified: Secondary | ICD-10-CM

## 2012-11-12 DIAGNOSIS — R7309 Other abnormal glucose: Secondary | ICD-10-CM | POA: Diagnosis not present

## 2012-11-12 DIAGNOSIS — E669 Obesity, unspecified: Secondary | ICD-10-CM

## 2012-11-12 DIAGNOSIS — R739 Hyperglycemia, unspecified: Secondary | ICD-10-CM

## 2012-11-12 DIAGNOSIS — I10 Essential (primary) hypertension: Secondary | ICD-10-CM

## 2012-11-12 DIAGNOSIS — E039 Hypothyroidism, unspecified: Secondary | ICD-10-CM

## 2012-11-12 DIAGNOSIS — E8881 Metabolic syndrome: Secondary | ICD-10-CM

## 2012-11-12 NOTE — Progress Notes (Signed)
Patient ID: Lauren Morris, female   DOB: 11-22-1931, 79 y.o.   MRN: [id number]   Patient Active Problem List   Diagnosis Date Noted  . Obesity, unspecified 11/13/2012  . Insulin resistance 08/04/2012  . Routine general medical examination at a health care facility 03/11/2012  . Hyperlipidemia LDL goal <100 04/15/2011  . Hypertension 02/22/2011  . Hypothyroidism 02/22/2011    Subjective:  CC:   Chief Complaint  Patient presents with  . Follow-up    DM and labs    HPI:   Lauren Morris a 79 y.o. female who presents for follow up on abnormal glucose.  She was advised to control the carbohydrates i n er diet but has had a difficult time.   Has been indulging in sweets.   Sugar free cookies.  Eats fruits and veggies regularly,.  No signs or symptoms of diabetes   Some allergic rhinitis symptoms without congestion or facila pain me allergic rhinits     Past Medical History  Diagnosis Date  . Glaucoma   . Hypertension   . Thyroid disease   . Cyst     hx o on right breast  . DJD (degenerative joint disease)     right knee    Past Surgical History  Procedure Laterality Date  . Vitrectomy  July 2012    right eye with membrane peel Lelon Perla, [place])  . Abdominal hysterectomy         The following portions of the patient's history were reviewed and updated as appropriate: Allergies, current medications, and problem list.    Review of Systems:   12 Pt  review of systems was negative except those addressed in the HPI,     History   Social History  . Marital Status: Widowed    Spouse Name: N/A    Number of Children: N/A  . Years of Education: N/A   Occupational History  . Not on file.   Social History Main Topics  . Smoking status: Never Smoker   . Smokeless tobacco: Never Used  . Alcohol Use: No  . Drug Use: No  . Sexually Active: Not on file   Other Topics Concern  . Not on file   Social History Narrative  . No narrative on file    Objective:  BP  124/60  Pulse 66  Temp(Src) 97.5 F (36.4 C) (Oral)  Resp 16  Wt 185 lb 8 oz (84.142 kg)  BMI 33.92 kg/m2  SpO2 98%  General appearance: alert, cooperative and appears stated age Ears: normal TM's and external ear canals both ears Throat: lips, mucosa, and tongue normal; teeth and gums normal Neck: no adenopathy, no carotid bruit, supple, symmetrical, trachea midline and thyroid not enlarged, symmetric, no tenderness/mass/nodules Back: symmetric, no curvature. ROM normal. No CVA tenderness. Lungs: clear to auscultation bilaterally Heart: regular rate and rhythm, S1, S2 normal, no murmur, click, rub or gallop Abdomen: soft, non-tender; bowel sounds normal; no masses,  no organomegaly Pulses: 2+ and symmetric Skin: Skin color, texture, turgor normal. No rashes or lesions Lymph nodes: Cervical, supraclavicular, and axillary nodes normal.  Assessment and Plan:  Insulin resistance I have recommended a low glycemic index diet utilizing smaller more frequent meals to increase metabolism.  I have also recommended that patient start exercising with a goal of 30 minutes of aerobic exercise a minimum of 5 days per week. Screening for lipid disorders, thyroid and diabetes to be done today.    Hypertension Well controlled on current regimen.  Renal function stable, no changes today.  Obesity, unspecified I have addressed  BMI and recommended a low glycemic index diet utilizing smaller more frequent meals to increase metabolism.  I have also recommended that patient start exercising with a goal of 30 minutes of aerobic exercise a minimum of 5 days per week.    Updated Medication List Outpatient Encounter Prescriptions as of 11/12/2012  Medication Sig Dispense Refill  . amLODipine (NORVASC) 10 MG tablet Take 1 tablet (10 mg total) by mouth daily.  30 tablet  5  . Calcium Carb-Cholecalciferol (CALCIUM 1000 + D PO) Take 1 tablet by mouth daily.        . Cholecalciferol (VITAMIN D) 1000 UNITS  capsule Take 1,000 Units by mouth daily.        . fish oil-omega-3 fatty acids 1000 MG capsule Take 2 g by mouth daily.        . furosemide (LASIX) 20 MG tablet Take 1 tablet (20 mg total) by mouth daily as needed (for fluid retention).  30 tablet  11  . levothyroxine (SYNTHROID, LEVOTHROID) 25 MCG tablet TAKE 1 TABLET BY MOUTH EVERY DAY  30 tablet  5  . timolol (BETIMOL) 0.25 % ophthalmic solution Place 1-2 drops into both eyes 2 (two) times daily.      . travoprost, benzalkonium, (TRAVATAN) 0.004 % ophthalmic solution Place 1 drop into both eyes at bedtime.       No facility-administered encounter medications on file as of 11/12/2012.     Orders Placed This Encounter  Procedures  . MM Digital Screening  . Lipid panel  . Hemoglobin A1c  . TSH  . Comprehensive metabolic panel    No Follow-up on file.

## 2012-11-12 NOTE — Patient Instructions (Addendum)
I recommend using allegra  180 mg daily during the pollen season (fexofenadine)  I also recommend using Simply Saline to flush sinuses once or twice daily to flush pollen out and prevent infections   Be careful with "sugar free" cookes and desserts,  They still have lots of carbohydrates  [place] the glycemic List ,  The 'low" category is the best to eat from   We will recheck your A1c and BMEt in 3 months

## 2012-11-13 ENCOUNTER — Encounter: Payer: Self-pay | Admitting: Internal Medicine

## 2012-11-13 DIAGNOSIS — E669 Obesity, unspecified: Secondary | ICD-10-CM | POA: Insufficient documentation

## 2012-11-13 NOTE — Assessment & Plan Note (Signed)
I have recommended a low glycemic index diet utilizing smaller more frequent meals to increase metabolism.  I have also recommended that patient start exercising with a goal of 30 minutes of aerobic exercise a minimum of 5 days per week. Screening for lipid disorders, thyroid and diabetes to be done today.   

## 2012-11-13 NOTE — Assessment & Plan Note (Signed)
I have addressed  BMI and recommended a low glycemic index diet utilizing smaller more frequent meals to increase metabolism.  I have also recommended that patient start exercising with a goal of 30 minutes of aerobic exercise a minimum of 5 days per week.  

## 2012-11-13 NOTE — Assessment & Plan Note (Signed)
Well controlled on current regimen. Renal function stable, no changes today. 

## 2012-11-21 ENCOUNTER — Other Ambulatory Visit: Payer: Self-pay | Admitting: Internal Medicine

## 2012-12-17 DIAGNOSIS — H409 Unspecified glaucoma: Secondary | ICD-10-CM | POA: Diagnosis not present

## 2012-12-17 DIAGNOSIS — H4011X0 Primary open-angle glaucoma, stage unspecified: Secondary | ICD-10-CM | POA: Diagnosis not present

## 2012-12-22 DIAGNOSIS — Z1231 Encounter for screening mammogram for malignant neoplasm of breast: Secondary | ICD-10-CM | POA: Diagnosis not present

## 2012-12-22 LAB — HM MAMMOGRAPHY: HM MAMMO: NORMAL

## 2013-01-14 ENCOUNTER — Encounter: Payer: Self-pay | Admitting: Internal Medicine

## 2013-02-10 ENCOUNTER — Other Ambulatory Visit (INDEPENDENT_AMBULATORY_CARE_PROVIDER_SITE_OTHER): Payer: Medicare Other

## 2013-02-10 DIAGNOSIS — R739 Hyperglycemia, unspecified: Secondary | ICD-10-CM

## 2013-02-10 DIAGNOSIS — E039 Hypothyroidism, unspecified: Secondary | ICD-10-CM

## 2013-02-10 DIAGNOSIS — E785 Hyperlipidemia, unspecified: Secondary | ICD-10-CM

## 2013-02-10 DIAGNOSIS — R7309 Other abnormal glucose: Secondary | ICD-10-CM

## 2013-02-10 LAB — COMPREHENSIVE METABOLIC PANEL
AST: 27 U/L (ref 0–37)
Albumin: 4 g/dL (ref 3.5–5.2)
Alkaline Phosphatase: 64 U/L (ref 39–117)
Glucose, Bld: 106 mg/dL — ABNORMAL HIGH (ref 70–99)
Potassium: 4.1 mEq/L (ref 3.5–5.1)
Sodium: 140 mEq/L (ref 135–145)
Total Bilirubin: 0.6 mg/dL (ref 0.3–1.2)
Total Protein: 7.8 g/dL (ref 6.0–8.3)

## 2013-02-10 LAB — LIPID PANEL
Cholesterol: 144 mg/dL (ref 0–200)
LDL Cholesterol: 85 mg/dL (ref 0–99)
Triglycerides: 147 mg/dL (ref 0.0–149.0)
VLDL: 29.4 mg/dL (ref 0.0–40.0)

## 2013-02-12 ENCOUNTER — Ambulatory Visit (INDEPENDENT_AMBULATORY_CARE_PROVIDER_SITE_OTHER): Payer: Medicare Other | Admitting: Internal Medicine

## 2013-02-12 ENCOUNTER — Encounter: Payer: Self-pay | Admitting: Internal Medicine

## 2013-02-12 VITALS — BP 128/68 | HR 71 | Temp 97.5°F | Resp 14 | Wt 185.8 lb

## 2013-02-12 DIAGNOSIS — E039 Hypothyroidism, unspecified: Secondary | ICD-10-CM

## 2013-02-12 DIAGNOSIS — E8881 Metabolic syndrome: Secondary | ICD-10-CM

## 2013-02-12 DIAGNOSIS — E785 Hyperlipidemia, unspecified: Secondary | ICD-10-CM

## 2013-02-12 DIAGNOSIS — E669 Obesity, unspecified: Secondary | ICD-10-CM

## 2013-02-12 DIAGNOSIS — I10 Essential (primary) hypertension: Secondary | ICD-10-CM | POA: Diagnosis not present

## 2013-02-12 LAB — HM DIABETES FOOT EXAM: HM Diabetic Foot Exam: NORMAL

## 2013-02-12 NOTE — Patient Instructions (Addendum)
Try going for a walk for 15 minutes after two of your biggest meals.   Try having strawberries and whipped cream as a regular dessert  Cereals are very high in carbohydrates so limit cereal ,  Try eggs/protein breakfasts instead   Eat more almonds walnuts and pecans every day instead of cashews and peanuts   Your goal to get your BMI below 30 (the "obesity cutoff") is 162 lbs.  That means you need to lose  to 26 lbs over the next year

## 2013-02-12 NOTE — Progress Notes (Signed)
Patient ID: Lauren Morris, female   DOB: August 28, 1931, 79 y.o.   MRN: [id number]  Patient Active Problem List   Diagnosis Date Noted  . Obesity, unspecified 11/13/2012  . Insulin resistance 08/04/2012  . Routine general medical examination at a health care facility 03/11/2012  . Hyperlipidemia LDL goal <100 04/15/2011  . Hypertension 02/22/2011  . Hypothyroidism 02/22/2011    Subjective:  CC:   Chief Complaint  Patient presents with  . Follow-up    3  month follow up    HPI:   Lauren Morris a 79 y.o. female who presents Follow up on insulin resistance and obesity.  Diet reviewed.  She has made a moderate effort to limit her complex carbohydrates ; she continues to have dessert in the form of cakes and pies infrequently on the average of about twice a week. She is sweet potatoes twice a week as well. She she continues to have cereal for breakfast. She is not exercising on a regular basis and has not made it a  concerted effort to lose weight. She has some joint pain due to degenerative disease and is limited by knee pain somewhat. She does not have access to swimming pool for aerobic classes.   Past Medical History  Diagnosis Date  . Glaucoma   . Hypertension   . Thyroid disease   . Cyst     hx o on right breast  . DJD (degenerative joint disease)     right knee    Past Surgical History  Procedure Laterality Date  . Vitrectomy  July 2012    right eye with membrane peel Lelon Perla, [place])  . Abdominal hysterectomy         The following portions of the patient's history were reviewed and updated as appropriate: Allergies, current medications, and problem list.    Review of Systems:   12 Pt  review of systems was negative except those addressed in the HPI,     History   Social History  . Marital Status: Widowed    Spouse Name: N/A    Number of Children: N/A  . Years of Education: N/A   Occupational History  . Not on file.   Social History Main Topics  .  Smoking status: Never Smoker   . Smokeless tobacco: Never Used  . Alcohol Use: No  . Drug Use: No  . Sexual Activity: Not on file   Other Topics Concern  . Not on file   Social History Narrative  . No narrative on file    Objective:  Filed Vitals:   02/12/13 0821  BP: 128/68  Pulse: 71  Temp: 97.5 F (36.4 C)  Resp: 14     General appearance: alert, cooperative and appears stated age Ears: normal TM's and external ear canals both ears Throat: lips, mucosa, and tongue normal; teeth and gums normal Neck: no adenopathy, no carotid bruit, supple, symmetrical, trachea midline and thyroid not enlarged, symmetric, no tenderness/mass/nodules Back: symmetric, no curvature. ROM normal. No CVA tenderness. Lungs: clear to auscultation bilaterally Heart: regular rate and rhythm, S1, S2 normal, no murmur, click, rub or gallop Abdomen: soft, non-tender; bowel sounds normal; no masses,  no organomegaly Pulses: 2+ and symmetric Skin: Skin color, texture, turgor normal. No rashes or lesions Lymph nodes: Cervical, supraclavicular, and axillary nodes normal.  Assessment and Plan:  Insulin resistance I have addressed  BMI and recommended a low glycemic index diet utilizing smaller more frequent meals to increase metabolism.  I have  also recommended that patient start exercising with a goal of 30 minutes of aerobic exercise a minimum of 5 days per week.    Obesity, unspecified Diet was reviewed and it is a moderately low glycemic index diet.  need for daily exercise was  addressed. specific goals were discussed regarding weight loss needed to get BMI below 30. And I was given.  Hypertension Well controlled on current regimen. Renal function stable, no changes today.  Hypothyroidism Thyroid function is WNL on current dose.  No current changes needed.   Hyperlipidemia LDL goal <100 LDL and triglycerides are at goal without medications.  No changes today    Updated Medication  List Outpatient Encounter Prescriptions as of 02/12/2013  Medication Sig Dispense Refill  . amLODipine (NORVASC) 10 MG tablet TAKE 1 TABLET BY MOUTH EVERY DAY  30 tablet  5  . Calcium Carb-Cholecalciferol (CALCIUM 1000 + D PO) Take 1 tablet by mouth daily.        . Cholecalciferol (VITAMIN D) 1000 UNITS capsule Take 1,000 Units by mouth daily.        . fish oil-omega-3 fatty acids 1000 MG capsule Take 2 g by mouth daily.        . furosemide (LASIX) 20 MG tablet Take 1 tablet (20 mg total) by mouth daily as needed (for fluid retention).  30 tablet  11  . levothyroxine (SYNTHROID, LEVOTHROID) 25 MCG tablet TAKE 1 TABLET BY MOUTH EVERY DAY  30 tablet  5  . timolol (BETIMOL) 0.25 % ophthalmic solution Place 1-2 drops into both eyes 2 (two) times daily.      . travoprost, benzalkonium, (TRAVATAN) 0.004 % ophthalmic solution Place 1 drop into both eyes at bedtime.       No facility-administered encounter medications on file as of 02/12/2013.     Orders Placed This Encounter  Procedures  . HM DIABETES FOOT EXAM    Return in about 3 months (around 05/15/2013).

## 2013-02-15 ENCOUNTER — Encounter: Payer: Self-pay | Admitting: Internal Medicine

## 2013-02-15 NOTE — Assessment & Plan Note (Addendum)
Diet was reviewed and it is a moderately low glycemic index diet.  need for daily exercise was  addressed. specific goals were discussed regarding weight loss needed to get BMI below 30. And I was given.

## 2013-02-15 NOTE — Assessment & Plan Note (Signed)
LDL and triglycerides are at goal without medications.  No changes today

## 2013-02-15 NOTE — Assessment & Plan Note (Signed)
I have addressed  BMI and recommended a low glycemic index diet utilizing smaller more frequent meals to increase metabolism.  I have also recommended that patient start exercising with a goal of 30 minutes of aerobic exercise a minimum of 5 days per week.  

## 2013-02-15 NOTE — Assessment & Plan Note (Signed)
Thyroid function is WNL on current dose.  No current changes needed.  

## 2013-02-15 NOTE — Assessment & Plan Note (Signed)
Well controlled on current regimen. Renal function stable, no changes today. 

## 2013-04-22 DIAGNOSIS — Z23 Encounter for immunization: Secondary | ICD-10-CM | POA: Diagnosis not present

## 2013-04-23 ENCOUNTER — Other Ambulatory Visit: Payer: Self-pay | Admitting: Internal Medicine

## 2013-04-23 DIAGNOSIS — H40019 Open angle with borderline findings, low risk, unspecified eye: Secondary | ICD-10-CM | POA: Diagnosis not present

## 2013-04-27 ENCOUNTER — Other Ambulatory Visit: Payer: Self-pay | Admitting: Internal Medicine

## 2013-05-14 ENCOUNTER — Telehealth: Payer: Self-pay | Admitting: *Deleted

## 2013-05-14 DIAGNOSIS — E039 Hypothyroidism, unspecified: Secondary | ICD-10-CM

## 2013-05-14 DIAGNOSIS — E785 Hyperlipidemia, unspecified: Secondary | ICD-10-CM

## 2013-05-14 DIAGNOSIS — E559 Vitamin D deficiency, unspecified: Secondary | ICD-10-CM

## 2013-05-14 DIAGNOSIS — R739 Hyperglycemia, unspecified: Secondary | ICD-10-CM

## 2013-05-14 DIAGNOSIS — I10 Essential (primary) hypertension: Secondary | ICD-10-CM

## 2013-05-14 NOTE — Telephone Encounter (Signed)
Pt is coming in tomorrow what labs and dx?  

## 2013-05-15 ENCOUNTER — Other Ambulatory Visit (INDEPENDENT_AMBULATORY_CARE_PROVIDER_SITE_OTHER): Payer: Medicare Other

## 2013-05-15 DIAGNOSIS — E039 Hypothyroidism, unspecified: Secondary | ICD-10-CM | POA: Diagnosis not present

## 2013-05-15 DIAGNOSIS — R7309 Other abnormal glucose: Secondary | ICD-10-CM | POA: Diagnosis not present

## 2013-05-15 DIAGNOSIS — E785 Hyperlipidemia, unspecified: Secondary | ICD-10-CM

## 2013-05-15 DIAGNOSIS — R739 Hyperglycemia, unspecified: Secondary | ICD-10-CM

## 2013-05-15 DIAGNOSIS — I10 Essential (primary) hypertension: Secondary | ICD-10-CM | POA: Diagnosis not present

## 2013-05-15 DIAGNOSIS — E559 Vitamin D deficiency, unspecified: Secondary | ICD-10-CM | POA: Diagnosis not present

## 2013-05-15 LAB — COMPREHENSIVE METABOLIC PANEL
ALT: 32 U/L (ref 0–35)
AST: 31 U/L (ref 0–37)
Albumin: 3.9 g/dL (ref 3.5–5.2)
Alkaline Phosphatase: 73 U/L (ref 39–117)
CO2: 27 mEq/L (ref 19–32)
Calcium: 9.1 mg/dL (ref 8.4–10.5)
Chloride: 107 mEq/L (ref 96–112)
Creatinine, Ser: 0.7 mg/dL (ref 0.4–1.2)
GFR: 80.48 mL/min (ref >60.00)
Potassium: 4.7 mEq/L (ref 3.5–5.1)

## 2013-05-15 LAB — HEMOGLOBIN A1C: Hgb A1c MFr Bld: 6.3 % (ref 4.6–6.5)

## 2013-05-15 LAB — LIPID PANEL
Cholesterol: 135 mg/dL (ref 0–200)
HDL: 27.2 mg/dL — ABNORMAL LOW (ref >39.00)
LDL Cholesterol: 80 mg/dL (ref 0–99)
Total CHOL/HDL Ratio: 5
Triglycerides: 137 mg/dL (ref 0.0–149.0)

## 2013-05-15 LAB — MICROALBUMIN / CREATININE URINE RATIO
Creatinine,U: 77.4 mg/dL
Microalb Creat Ratio: 2.1 mg/g (ref 0.0–30.0)
Microalb, Ur: 1.6 mg/dL (ref 0.0–1.9)

## 2013-05-19 ENCOUNTER — Encounter: Payer: Self-pay | Admitting: *Deleted

## 2013-07-01 LAB — HM DIABETES EYE EXAM

## 2013-07-03 ENCOUNTER — Encounter: Payer: Self-pay | Admitting: Adult Health

## 2013-07-03 ENCOUNTER — Encounter (INDEPENDENT_AMBULATORY_CARE_PROVIDER_SITE_OTHER): Payer: Self-pay

## 2013-07-03 ENCOUNTER — Ambulatory Visit (INDEPENDENT_AMBULATORY_CARE_PROVIDER_SITE_OTHER): Payer: Medicare Other | Admitting: Adult Health

## 2013-07-03 VITALS — BP 130/84 | HR 76 | Temp 97.8°F | Resp 12 | Ht 62.0 in | Wt 190.0 lb

## 2013-07-03 DIAGNOSIS — L259 Unspecified contact dermatitis, unspecified cause: Secondary | ICD-10-CM

## 2013-07-03 DIAGNOSIS — L309 Dermatitis, unspecified: Secondary | ICD-10-CM | POA: Insufficient documentation

## 2013-07-03 MED ORDER — TRIAMCINOLONE ACETONIDE 0.1 % EX CREA: 1.0000 "application " | TOPICAL_CREAM | Freq: Two times a day (BID) | CUTANEOUS | Status: DC

## 2013-07-03 NOTE — Patient Instructions (Signed)
  Apply triamcinolone (Kenalog) twice a day. Apply morning and night time to affected area.  Use a lotion such as Lubriderm or Eucerin for dry skin. You can apply this lotion midday.  Return for followup appointment in 4 weeks with Dr. Derrel Nip

## 2013-07-03 NOTE — Progress Notes (Signed)
   Subjective:    Patient ID: Lauren Morris, female    DOB: [birth year], 81 y.o.   MRN: [id number]  HPI  Patient is a pleasant 80 year old female who presents to clinic with complaints of itchiness and irritation around bilateral ankles. This has been going on for several weeks. She has been applying Jergens lotion which she reports she has been using for very long time. Skin is excessively dry. She denies using new detergents, soaps, perfumes or lotions. She has not started any new medications. She has not tried any over-the-counter medications.  Current Outpatient Prescriptions on File Prior to Visit  Medication Sig Dispense Refill  . amLODipine (NORVASC) 10 MG tablet TAKE 1 TABLET BY MOUTH EVERY DAY  30 tablet  5  . Calcium Carb-Cholecalciferol (CALCIUM 1000 + D PO) Take 1 tablet by mouth daily.        . Cholecalciferol (VITAMIN D) 1000 UNITS capsule Take 1,000 Units by mouth daily.        . fish oil-omega-3 fatty acids 1000 MG capsule Take 2 g by mouth daily.        Marland Kitchen levothyroxine (SYNTHROID, LEVOTHROID) 25 MCG tablet TAKE 1 TABLET BY MOUTH EVERY DAY  30 tablet  9  . timolol (BETIMOL) 0.25 % ophthalmic solution Place 1-2 drops into both eyes 2 (two) times daily.      . travoprost, benzalkonium, (TRAVATAN) 0.004 % ophthalmic solution Place 1 drop into both eyes at bedtime.       No current facility-administered medications on file prior to visit.     Review of Systems  All other systems reviewed and are negative.   Positive for skin irritation and itchiness around both ankles Negative for inflammation, skin breakdown, drainage    Objective:   Physical Exam  Constitutional: She is oriented to person, place, and time.  Neurological: She is alert and oriented to person, place, and time.  Skin: Rash noted.  Slight erythema noted on the lateral side of both ankles. Skin is scaly and very dry. No skin breakdown. Appears like mild dermatitis  Psychiatric: She has a normal mood and  affect. Her behavior is normal. Judgment and thought content normal.          Assessment & Plan:

## 2013-07-03 NOTE — Assessment & Plan Note (Signed)
Mild irritation. Had to look very closely to observe. Apply triamcinolone bid x 7 days. Return for follow up appt in 1 month. Apply Lubriderm or Eucerin cream to excessive dry skin.

## 2013-07-03 NOTE — Progress Notes (Signed)
Pre visit review using our clinic review tool, if applicable. No additional management support is needed unless otherwise documented below in the visit note. 

## 2013-08-03 ENCOUNTER — Encounter: Payer: Self-pay | Admitting: Internal Medicine

## 2013-08-03 ENCOUNTER — Ambulatory Visit (INDEPENDENT_AMBULATORY_CARE_PROVIDER_SITE_OTHER): Payer: Medicare Other | Admitting: Internal Medicine

## 2013-08-03 ENCOUNTER — Encounter (INDEPENDENT_AMBULATORY_CARE_PROVIDER_SITE_OTHER): Payer: Self-pay

## 2013-08-03 VITALS — BP 138/62 | HR 60 | Temp 97.6°F | Resp 16 | Wt 191.0 lb

## 2013-08-03 DIAGNOSIS — R7301 Impaired fasting glucose: Secondary | ICD-10-CM

## 2013-08-03 DIAGNOSIS — I10 Essential (primary) hypertension: Secondary | ICD-10-CM

## 2013-08-03 DIAGNOSIS — R5381 Other malaise: Secondary | ICD-10-CM | POA: Diagnosis not present

## 2013-08-03 DIAGNOSIS — E785 Hyperlipidemia, unspecified: Secondary | ICD-10-CM | POA: Diagnosis not present

## 2013-08-03 DIAGNOSIS — R5383 Other fatigue: Principal | ICD-10-CM

## 2013-08-03 DIAGNOSIS — E039 Hypothyroidism, unspecified: Secondary | ICD-10-CM

## 2013-08-03 DIAGNOSIS — E669 Obesity, unspecified: Secondary | ICD-10-CM

## 2013-08-03 NOTE — Assessment & Plan Note (Addendum)
I have addressed  BMI and recommended a low glycemic index diet utilizing smaller more frequent meals to increase metabolism.  I have also recommended that patient start exercising with a goal of 30 minutes of aerobic exercise a minimum of 5 days per week.  

## 2013-08-03 NOTE — Progress Notes (Signed)
Patient ID: Lauren Morris, female   DOB: 1931/08/28, 80 y.o.   MRN: [id number]  Patient Active Problem List   Diagnosis Date Noted  . Obesity, unspecified 11/13/2012  . Insulin resistance 08/04/2012  . Routine general medical examination at a health care facility 03/11/2012  . Hyperlipidemia LDL goal <100 04/15/2011  . Hypertension 02/22/2011  . Hypothyroidism 02/22/2011    Subjective:  CC:   Chief Complaint  Patient presents with  . Follow-up    HPI:   Lauren Morris is a 80 y.o. female who presents for  Follow up on multiple issues, including bilateral LE rash,  Hypertension and obesity Her ankle rash has resolved with use of triamcinolone ointment.  She has had no recurrence  .  Her hypertension is well controlled with current medications.  She has gained 6 lbs since October.  Not exercising,  Not watching her diet.    Past Medical History  Diagnosis Date  . Glaucoma   . Hypertension   . Thyroid disease   . Cyst     hx o on right breast  . DJD (degenerative joint disease)     right knee    Past Surgical History  Procedure Laterality Date  . Vitrectomy  July 2012    right eye with membrane peel [name], [place])  . Abdominal hysterectomy         The following portions of the patient's history were reviewed and updated as appropriate: Allergies, current medications, and problem list.    Review of Systems:   Patient denies headache, fevers, malaise, unintentional weight loss, skin rash, eye pain, sinus congestion and sinus pain, sore throat, dysphagia,  hemoptysis , cough, dyspnea, wheezing, chest pain, palpitations, orthopnea, edema, abdominal pain, nausea, melena, diarrhea, constipation, flank pain, dysuria, hematuria, urinary  Frequency, nocturia, numbness, tingling, seizures,  Focal weakness, Loss of consciousness,  Tremor, insomnia, depression, anxiety, and suicidal ideation.     History   Social History  . Marital Status: Widowed    Spouse Name: N/A   Number of Children: N/A  . Years of Education: N/A   Occupational History  . Not on file.   Social History Main Topics  . Smoking status: Never Smoker   . Smokeless tobacco: Never Used  . Alcohol Use: No  . Drug Use: No  . Sexual Activity: Not on file   Other Topics Concern  . Not on file   Social History Narrative  . No narrative on file    Objective:  Filed Vitals:   08/03/13 0901  BP: 138/62  Pulse: 60  Temp: 97.6 F (36.4 C)  Resp: 16     General appearance: alert, cooperative and appears stated age Ears: normal TM's and external ear canals both ears Throat: lips, mucosa, and tongue normal; teeth and gums normal Neck: no adenopathy, no carotid bruit, supple, symmetrical, trachea midline and thyroid not enlarged, symmetric, no tenderness/mass/nodules Back: symmetric, no curvature. ROM normal. No CVA tenderness. Lungs: clear to auscultation bilaterally Heart: regular rate and rhythm, S1, S2 normal, no murmur, click, rub or gallop Abdomen: soft, non-tender; bowel sounds normal; no masses,  no organomegaly Pulses: 2+ and symmetric Skin: Skin color, texture, turgor normal. No rashes or lesions Lymph nodes: Cervical, supraclavicular, and axillary nodes normal.  Assessment and Plan:  Obesity, unspecified I have addressed  BMI and recommended a low glycemic index diet utilizing smaller more frequent meals to increase metabolism.  I have also recommended that patient start exercising with a goal of  30 minutes of aerobic exercise a minimum of 5 days per week.   Hypertension Well controlled on current regimen. Renal function stable, no changes today.  Hypothyroidism Thyroid function is WNL on current dose.  No current changes needed.      Updated Medication List Outpatient Encounter Prescriptions as of 08/03/2013  Medication Sig  . amLODipine (NORVASC) 10 MG tablet TAKE 1 TABLET BY MOUTH EVERY DAY  . Calcium Carb-Cholecalciferol (CALCIUM 1000 + D PO) Take 1 tablet  by mouth daily.    . Cholecalciferol (VITAMIN D) 1000 UNITS capsule Take 1,000 Units by mouth daily.    . fish oil-omega-3 fatty acids 1000 MG capsule Take 2 g by mouth daily.    . furosemide (LASIX) 20 MG tablet Take 20 mg by mouth daily as needed (for fluid retention).  Marland Kitchen levothyroxine (SYNTHROID, LEVOTHROID) 25 MCG tablet TAKE 1 TABLET BY MOUTH EVERY DAY  . timolol (BETIMOL) 0.25 % ophthalmic solution Place 1-2 drops into both eyes 2 (two) times daily.  . travoprost, benzalkonium, (TRAVATAN) 0.004 % ophthalmic solution Place 1 drop into both eyes at bedtime.  . triamcinolone cream (KENALOG) 0.1 % Apply 1 application topically 2 (two) times daily.     Orders Placed This Encounter  Procedures  . CBC with Differential  . Comprehensive metabolic panel  . TSH  . Lipid panel  . Hemoglobin A1c  . Microalbumin / creatinine urine ratio    No Follow-up on file.

## 2013-08-03 NOTE — Patient Instructions (Addendum)
Luberiderm,  Eucerin and Aveeno are the best moisturizers .  I want you to lose 20 lbs over the next 6 months    Try [employer] as a snack,  Try  Snacking of mini sweet peppers available at [employer] and [employer]  Try using Joseph's pita bread  For sandwiches and dips  snack on nuts but avoid the medleys with raisins and cranberries   Try Dannon Light and fit Greek yogurt   At [employer]   Return in May for fasting labs same day as physical

## 2013-08-03 NOTE — Progress Notes (Signed)
Pre-visit discussion using our clinic review tool. No additional management support is needed unless otherwise documented below in the visit note.  

## 2013-08-03 NOTE — Assessment & Plan Note (Signed)
Well controlled on current regimen. Renal function stable, no changes today. 

## 2013-08-03 NOTE — Assessment & Plan Note (Signed)
Thyroid function is WNL on current dose.  No current changes needed.  

## 2013-08-04 ENCOUNTER — Telehealth: Payer: Self-pay | Admitting: Internal Medicine

## 2013-08-04 NOTE — Telephone Encounter (Signed)
Relevant patient education mailed to patient.  

## 2013-08-27 DIAGNOSIS — H409 Unspecified glaucoma: Secondary | ICD-10-CM | POA: Diagnosis not present

## 2013-08-27 DIAGNOSIS — H4011X0 Primary open-angle glaucoma, stage unspecified: Secondary | ICD-10-CM | POA: Diagnosis not present

## 2013-10-29 ENCOUNTER — Encounter (INDEPENDENT_AMBULATORY_CARE_PROVIDER_SITE_OTHER): Payer: Self-pay

## 2013-10-29 ENCOUNTER — Encounter: Payer: Self-pay | Admitting: Internal Medicine

## 2013-10-29 ENCOUNTER — Ambulatory Visit (INDEPENDENT_AMBULATORY_CARE_PROVIDER_SITE_OTHER): Payer: Medicare Other | Admitting: Internal Medicine

## 2013-10-29 VITALS — BP 124/68 | HR 61 | Temp 97.7°F | Resp 16 | Ht 62.0 in | Wt 184.8 lb

## 2013-10-29 DIAGNOSIS — E88819 Insulin resistance, unspecified: Secondary | ICD-10-CM

## 2013-10-29 DIAGNOSIS — E785 Hyperlipidemia, unspecified: Secondary | ICD-10-CM | POA: Diagnosis not present

## 2013-10-29 DIAGNOSIS — Z0000 Encounter for general adult medical examination without abnormal findings: Secondary | ICD-10-CM | POA: Diagnosis not present

## 2013-10-29 DIAGNOSIS — R7301 Impaired fasting glucose: Secondary | ICD-10-CM | POA: Diagnosis not present

## 2013-10-29 DIAGNOSIS — E8881 Metabolic syndrome: Secondary | ICD-10-CM | POA: Diagnosis not present

## 2013-10-29 DIAGNOSIS — R5381 Other malaise: Secondary | ICD-10-CM | POA: Diagnosis not present

## 2013-10-29 DIAGNOSIS — I10 Essential (primary) hypertension: Secondary | ICD-10-CM

## 2013-10-29 DIAGNOSIS — R5383 Other fatigue: Secondary | ICD-10-CM | POA: Diagnosis not present

## 2013-10-29 DIAGNOSIS — Z23 Encounter for immunization: Secondary | ICD-10-CM | POA: Diagnosis not present

## 2013-10-29 DIAGNOSIS — E039 Hypothyroidism, unspecified: Secondary | ICD-10-CM

## 2013-10-29 LAB — LIPID PANEL
CHOL/HDL RATIO: 5
Cholesterol: 136 mg/dL (ref 0–200)
HDL: 30.1 mg/dL — ABNORMAL LOW (ref >39.00)
LDL Cholesterol: 83 mg/dL (ref 0–99)
Triglycerides: 114 mg/dL (ref 0.0–149.0)
VLDL: 22.8 mg/dL (ref 0.0–40.0)

## 2013-10-29 LAB — CBC WITH DIFFERENTIAL/PLATELET
BASOS PCT: 0.4 % (ref 0.0–3.0)
Basophils Absolute: 0 10*3/uL (ref 0.0–0.1)
Eosinophils Absolute: 0.3 10*3/uL (ref 0.0–0.7)
Eosinophils Relative: 3.5 % (ref 0.0–5.0)
HCT: 45.3 % (ref 36.0–46.0)
HEMOGLOBIN: 15.3 g/dL — AB (ref 12.0–15.0)
LYMPHS PCT: 31.6 % (ref 12.0–46.0)
Lymphs Abs: 2.5 10*3/uL (ref 0.7–4.0)
MCHC: 33.9 g/dL (ref 30.0–36.0)
MCV: 86.7 fl (ref 78.0–100.0)
MONOS PCT: 8.3 % (ref 3.0–12.0)
Monocytes Absolute: 0.6 10*3/uL (ref 0.1–1.0)
Neutro Abs: 4.4 10*3/uL (ref 1.4–7.7)
Neutrophils Relative %: 56.2 % (ref 43.0–77.0)
Platelets: 251 10*3/uL (ref 150.0–400.0)
RBC: 5.22 Mil/uL — AB (ref 3.87–5.11)
RDW: 13.4 % (ref 11.5–15.5)
WBC: 7.8 10*3/uL (ref 4.0–10.5)

## 2013-10-29 LAB — COMPREHENSIVE METABOLIC PANEL
ALT: 37 U/L — AB (ref 0–35)
AST: 36 U/L (ref 0–37)
Albumin: 4.1 g/dL (ref 3.5–5.2)
Alkaline Phosphatase: 75 U/L (ref 39–117)
BUN: 14 mg/dL (ref 6–23)
CALCIUM: 9.2 mg/dL (ref 8.4–10.5)
CHLORIDE: 104 meq/L (ref 96–112)
CO2: 29 meq/L (ref 19–32)
Creatinine, Ser: 0.8 mg/dL (ref 0.4–1.2)
GFR: 74.55 mL/min (ref >60.00)
Glucose, Bld: 104 mg/dL — ABNORMAL HIGH (ref 70–99)
Potassium: 4.1 mEq/L (ref 3.5–5.1)
Sodium: 139 mEq/L (ref 135–145)
Total Bilirubin: 0.7 mg/dL (ref 0.2–1.2)
Total Protein: 7.8 g/dL (ref 6.0–8.3)

## 2013-10-29 LAB — HM DIABETES FOOT EXAM: HM Diabetic Foot Exam: NORMAL

## 2013-10-29 LAB — TSH: TSH: 2.6 u[IU]/mL (ref 0.35–4.50)

## 2013-10-29 LAB — MICROALBUMIN / CREATININE URINE RATIO
Creatinine,U: 144.6 mg/dL
Microalb Creat Ratio: 0.6 mg/g (ref 0.0–30.0)
Microalb, Ur: 0.9 mg/dL (ref 0.0–1.9)

## 2013-10-29 LAB — HEMOGLOBIN A1C: HEMOGLOBIN A1C: 6.4 % (ref 4.6–6.5)

## 2013-10-29 NOTE — Patient Instructions (Signed)
You had your annual Medicare wellness exam today  We will schedule your mammogram  In July.  Please use the stool kit to send [place] back a sample to test for blood.  This is your colon CA screening test.   You received the pneumonia vaccine today.  We will contact you with the bloodwork results

## 2013-10-29 NOTE — Progress Notes (Signed)
Pre-visit discussion using our clinic review tool. No additional management support is needed unless otherwise documented below in the visit note.  

## 2013-10-31 NOTE — Assessment & Plan Note (Signed)
Lab Results  Component Value Date   HGBA1C 6.4 10/29/2013  I have recommended a low glycemic index diet utilizing smaller more frequent meals to increase metabolism.  I have also recommended that patient start exercising with a goal of 30 minutes of aerobic exercise a minimum of 5 days per week.

## 2013-10-31 NOTE — Assessment & Plan Note (Signed)
Well controlled on current regimen. Renal function stable, no changes today.  Lab Results  Component Value Date   CREATININE 0.8 10/29/2013

## 2013-10-31 NOTE — Assessment & Plan Note (Signed)
Annual comprehensive exam was done including breast, excluding pelvic and PAP smear. All screenings have been addressed .  

## 2013-10-31 NOTE — Progress Notes (Signed)
Patient ID: Lauren Morris, female   DOB: January 07, 1932, 80 y.o.   MRN: [id number]  The patient is here for annual Medicare wellness examination and management of other chronic and acute problems.   The risk factors are reflected in the social history.  The roster of all physicians providing medical care to patient - is listed in the Snapshot section of the chart.  Activities of daily living:  The patient is 100% independent in all ADLs: dressing, toileting, feeding as well as independent mobility  Home safety : The patient has smoke detectors in the home. They wear seatbelts.  There are no firearms at home. There is no violence in the home.   There is no risks for hepatitis, STDs or HIV. There is no   history of blood transfusion. They have no travel history to infectious disease endemic areas of the world.  The patient has seen their dentist in the last six month. They have seen their eye doctor in the last year. They admit to slight hearing difficulty with regard to whispered voices and some television programs.  They have deferred audiologic testing in the last year.  They do not  have excessive sun exposure. Discussed the need for sun protection: hats, long sleeves and use of sunscreen if there is significant sun exposure.   Diet: the importance of a healthy diet is discussed. They do have a healthy diet.  The benefits of regular aerobic exercise were discussed. She walks 4 times per week ,  20 minutes.   Depression screen: there are no signs or vegative symptoms of depression- irritability, change in appetite, anhedonia, sadness/tearfullness.  Cognitive assessment: the patient manages all their financial and personal affairs and is actively engaged. They could relate day,date,year and events; recalled 2/3 objects at 3 minutes; performed clock-face test normally.  The following portions of the patient's history were reviewed and updated as appropriate: allergies, current medications, past  family history, past medical history,  past surgical history, past social history  and problem list.  Visual acuity was not assessed per patient preference since she has regular follow up with her ophthalmologist. Hearing and body mass index were assessed and reviewed.   During the course of the visit the patient was educated and counseled about appropriate screening and preventive services including : fall prevention , diabetes screening, nutrition counseling, colorectal cancer screening, and recommended immunizations.    Objective:   General appearance: alert, cooperative and appears stated age Head: Normocephalic, without obvious abnormality, atraumatic Eyes: conjunctivae/corneas clear. PERRL, EOM's intact. Fundi benign. Ears: normal TM's and external ear canals both ears Nose: Nares normal. Septum midline. Mucosa normal. No drainage or sinus tenderness. Throat: lips, mucosa, and tongue normal; teeth and gums normal Neck: no adenopathy, no carotid bruit, no JVD, supple, symmetrical, trachea midline and thyroid not enlarged, symmetric, no tenderness/mass/nodules Lungs: clear to auscultation bilaterally Breasts: normal appearance, no masses or tenderness Heart: regular rate and rhythm, S1, S2 normal, no murmur, click, rub or gallop Abdomen: soft, non-tender; bowel sounds normal; no masses,  no organomegaly Extremities: extremities normal, atraumatic, no cyanosis or edema Pulses: 2+ and symmetric Skin: Skin color, texture, turgor normal. No rashes or lesions Neurologic: Alert and oriented X 3, normal strength and tone. Normal symmetric reflexes. Normal coordination and gait.   Assessment and Plan:   Hypertension Well controlled on current regimen. Renal function stable, no changes today.  Lab Results  Component Value Date   CREATININE 0.8 10/29/2013     Hypothyroidism Thyroid  function is WNL on current dose.  No current changes needed.   Lab Results  Component Value Date   TSH  2.60 10/29/2013     Insulin resistance Lab Results  Component Value Date   HGBA1C 6.4 10/29/2013  I have recommended a low glycemic index diet utilizing smaller more frequent meals to increase metabolism.  I have also recommended that patient start exercising with a goal of 30 minutes of aerobic exercise a minimum of 5 days per week.   Routine general medical examination at a health care facility Annual comprehensive exam was done including breast, excluding pelvic and PAP smear. All screenings have been addressed .    Updated Medication List Outpatient Encounter Prescriptions as of 10/29/2013  Medication Sig  . amLODipine (NORVASC) 10 MG tablet TAKE 1 TABLET BY MOUTH EVERY DAY  . Calcium Carb-Cholecalciferol (CALCIUM 1000 + D PO) Take 1 tablet by mouth daily.    . Cholecalciferol (VITAMIN D) 1000 UNITS capsule Take 1,000 Units by mouth daily.    . fish oil-omega-3 fatty acids 1000 MG capsule Take 2 g by mouth daily.    . furosemide (LASIX) 20 MG tablet Take 20 mg by mouth daily as needed (for fluid retention).  Marland Kitchen levothyroxine (SYNTHROID, LEVOTHROID) 25 MCG tablet TAKE 1 TABLET BY MOUTH EVERY DAY  . timolol (BETIMOL) 0.25 % ophthalmic solution Place 1-2 drops into both eyes 2 (two) times daily.  . travoprost, benzalkonium, (TRAVATAN) 0.004 % ophthalmic solution Place 1 drop into both eyes at bedtime.  . triamcinolone cream (KENALOG) 0.1 % Apply 1 application topically 2 (two) times daily.

## 2013-10-31 NOTE — Assessment & Plan Note (Signed)
Thyroid function is WNL on current dose.  No current changes needed.   Lab Results  Component Value Date   TSH 2.60 10/29/2013

## 2013-11-02 ENCOUNTER — Encounter: Payer: Self-pay | Admitting: *Deleted

## 2013-11-23 ENCOUNTER — Other Ambulatory Visit: Payer: Self-pay | Admitting: *Deleted

## 2013-11-23 MED ORDER — AMLODIPINE BESYLATE 10 MG PO TABS: ORAL_TABLET | ORAL | Status: DC

## 2013-12-22 ENCOUNTER — Telehealth: Payer: Self-pay | Admitting: Internal Medicine

## 2013-12-22 DIAGNOSIS — Z1239 Encounter for other screening for malignant neoplasm of breast: Secondary | ICD-10-CM

## 2013-12-22 NOTE — Telephone Encounter (Signed)
Ordered

## 2013-12-22 NOTE — Telephone Encounter (Signed)
Patient called requesting referral for yearly Mammogram.

## 2014-01-28 ENCOUNTER — Encounter: Payer: Self-pay | Admitting: *Deleted

## 2014-01-28 DIAGNOSIS — Z1231 Encounter for screening mammogram for malignant neoplasm of breast: Secondary | ICD-10-CM | POA: Diagnosis not present

## 2014-01-28 LAB — HM MAMMOGRAPHY: HM MAMMO: NEGATIVE

## 2014-02-04 DIAGNOSIS — H4011X0 Primary open-angle glaucoma, stage unspecified: Secondary | ICD-10-CM | POA: Diagnosis not present

## 2014-02-04 DIAGNOSIS — H409 Unspecified glaucoma: Secondary | ICD-10-CM | POA: Diagnosis not present

## 2014-02-25 ENCOUNTER — Other Ambulatory Visit: Payer: Self-pay | Admitting: *Deleted

## 2014-02-25 MED ORDER — LEVOTHYROXINE SODIUM 25 MCG PO TABS: ORAL_TABLET | ORAL | Status: DC

## 2014-04-14 DIAGNOSIS — Z23 Encounter for immunization: Secondary | ICD-10-CM | POA: Diagnosis not present

## 2014-05-28 ENCOUNTER — Other Ambulatory Visit: Payer: Self-pay | Admitting: *Deleted

## 2014-05-28 MED ORDER — AMLODIPINE BESYLATE 10 MG PO TABS: ORAL_TABLET | ORAL | Status: DC

## 2014-06-10 DIAGNOSIS — H35371 Puckering of macula, right eye: Secondary | ICD-10-CM | POA: Diagnosis not present

## 2014-06-10 DIAGNOSIS — H4011X1 Primary open-angle glaucoma, mild stage: Secondary | ICD-10-CM | POA: Diagnosis not present

## 2014-06-28 ENCOUNTER — Telehealth: Payer: Self-pay | Admitting: Internal Medicine

## 2014-06-28 ENCOUNTER — Other Ambulatory Visit: Payer: Self-pay | Admitting: Internal Medicine

## 2014-06-28 MED ORDER — AMLODIPINE BESYLATE 10 MG PO TABS: ORAL_TABLET | ORAL | Status: DC

## 2014-06-28 NOTE — Telephone Encounter (Signed)
Patient has an appointment scheduled for 07/05/14 is ok to fill amlodipine until OV?

## 2014-06-28 NOTE — Telephone Encounter (Signed)
30 day supply authorized and sent

## 2014-07-05 ENCOUNTER — Ambulatory Visit: Payer: Medicare Other | Admitting: Internal Medicine

## 2014-07-08 ENCOUNTER — Ambulatory Visit (INDEPENDENT_AMBULATORY_CARE_PROVIDER_SITE_OTHER): Payer: Medicare Other | Admitting: Internal Medicine

## 2014-07-08 ENCOUNTER — Encounter: Payer: Self-pay | Admitting: Internal Medicine

## 2014-07-08 VITALS — BP 130/70 | HR 61 | Temp 97.4°F | Resp 16 | Ht 62.0 in | Wt 192.0 lb

## 2014-07-08 DIAGNOSIS — E669 Obesity, unspecified: Secondary | ICD-10-CM

## 2014-07-08 DIAGNOSIS — I10 Essential (primary) hypertension: Secondary | ICD-10-CM | POA: Diagnosis not present

## 2014-07-08 DIAGNOSIS — E119 Type 2 diabetes mellitus without complications: Secondary | ICD-10-CM

## 2014-07-08 DIAGNOSIS — E038 Other specified hypothyroidism: Secondary | ICD-10-CM

## 2014-07-08 DIAGNOSIS — E785 Hyperlipidemia, unspecified: Secondary | ICD-10-CM | POA: Diagnosis not present

## 2014-07-08 DIAGNOSIS — R7301 Impaired fasting glucose: Secondary | ICD-10-CM | POA: Diagnosis not present

## 2014-07-08 DIAGNOSIS — Z0000 Encounter for general adult medical examination without abnormal findings: Secondary | ICD-10-CM

## 2014-07-08 DIAGNOSIS — L819 Disorder of pigmentation, unspecified: Secondary | ICD-10-CM | POA: Diagnosis not present

## 2014-07-08 DIAGNOSIS — E559 Vitamin D deficiency, unspecified: Secondary | ICD-10-CM

## 2014-07-08 DIAGNOSIS — E034 Atrophy of thyroid (acquired): Secondary | ICD-10-CM | POA: Diagnosis not present

## 2014-07-08 LAB — LIPID PANEL
Cholesterol: 128 mg/dL (ref 0–200)
HDL: 32.8 mg/dL — ABNORMAL LOW (ref >39.00)
LDL CALC: 73 mg/dL (ref 0–99)
NONHDL: 95.2
Total CHOL/HDL Ratio: 4
Triglycerides: 112 mg/dL (ref 0.0–149.0)
VLDL: 22.4 mg/dL (ref 0.0–40.0)

## 2014-07-08 LAB — COMPREHENSIVE METABOLIC PANEL
ALT: 37 U/L — AB (ref 0–35)
AST: 32 U/L (ref 0–37)
Albumin: 4.2 g/dL (ref 3.5–5.2)
Alkaline Phosphatase: 83 U/L (ref 39–117)
BUN: 14 mg/dL (ref 6–23)
CO2: 26 mEq/L (ref 19–32)
Calcium: 9.3 mg/dL (ref 8.4–10.5)
Chloride: 106 mEq/L (ref 96–112)
Creatinine, Ser: 0.77 mg/dL (ref 0.40–1.20)
GFR: 76.65 mL/min (ref >60.00)
GLUCOSE: 118 mg/dL — AB (ref 70–99)
POTASSIUM: 4.5 meq/L (ref 3.5–5.1)
Sodium: 140 mEq/L (ref 135–145)
Total Bilirubin: 0.4 mg/dL (ref 0.2–1.2)
Total Protein: 7.8 g/dL (ref 6.0–8.3)

## 2014-07-08 LAB — VITAMIN D 25 HYDROXY (VIT D DEFICIENCY, FRACTURES): VITD: 46.95 ng/mL (ref 30.00–100.00)

## 2014-07-08 LAB — HEMOGLOBIN A1C: Hgb A1c MFr Bld: 6.4 % (ref 4.6–6.5)

## 2014-07-08 LAB — TSH: TSH: 2.64 u[IU]/mL (ref 0.35–4.50)

## 2014-07-08 MED ORDER — FUROSEMIDE 20 MG PO TABS: 20.0000 mg | ORAL_TABLET | Freq: Every day | ORAL | Status: DC | PRN

## 2014-07-08 NOTE — Progress Notes (Signed)
Pre-visit discussion using our clinic review tool. No additional management support is needed unless otherwise documented below in the visit note.  

## 2014-07-08 NOTE — Progress Notes (Signed)
Patient ID: Lauren Morris, female   DOB: 02-17-1932, 81 y.o.   MRN: [id number]    Patient Active Problem List   Diagnosis Date Noted  . Obesity 11/13/2012  . Diabetes mellitus without complication 83/38/2505  . Routine general medical examination at a health care facility 03/11/2012  . Hyperlipidemia LDL goal <100 04/15/2011  . Hypertension 02/22/2011  . Hypothyroidism 02/22/2011    Subjective:  CC:   Chief Complaint  Patient presents with  . Follow-up    medicartion    HPI:   Lauren Morris is a 81 y.o. female who presents for Follow up on chronic conditions including hypertension,  Diabetes with Obesity .    Last A1c 6.2  She gained  8 lbs over the holidays due to inactivity and overindulgence in confection.  She is not exercising regularly    Past Medical History  Diagnosis Date  . Glaucoma   . Hypertension   . Thyroid disease   . Cyst     hx o on right breast  . DJD (degenerative joint disease)     right knee    Past Surgical History  Procedure Laterality Date  . Vitrectomy  July 2012    right eye with membrane peel [name], [place])  . Abdominal hysterectomy         The following portions of the patient's history were reviewed and updated as appropriate: Allergies, current medications, and problem list.    Review of Systems:   Patient denies headache, fevers, malaise, unintentional weight loss, skin rash, eye pain, sinus congestion and sinus pain, sore throat, dysphagia,  hemoptysis , cough, dyspnea, wheezing, chest pain, palpitations, orthopnea, edema, abdominal pain, nausea, melena, diarrhea, constipation, flank pain, dysuria, hematuria, urinary  Frequency, nocturia, numbness, tingling, seizures,  Focal weakness, Loss of consciousness,  Tremor, insomnia, depression, anxiety, and suicidal ideation.     History   Social History  . Marital Status: Widowed    Spouse Name: N/A    Number of Children: N/A  . Years of Education: N/A   Occupational History  .  Not on file.   Social History Main Topics  . Smoking status: Never Smoker   . Smokeless tobacco: Never Used  . Alcohol Use: No  . Drug Use: No  . Sexual Activity: Not on file   Other Topics Concern  . Not on file   Social History Narrative    Objective:  Filed Vitals:   07/08/14 0808  BP: 130/70  Pulse: 61  Temp: 97.4 F (36.3 C)  Resp: 16     General appearance: alert, cooperative and appears stated age Ears: normal TM's and external ear canals both ears Throat: lips, mucosa, and tongue normal; teeth and gums normal Neck: no adenopathy, no carotid bruit, supple, symmetrical, trachea midline and thyroid not enlarged, symmetric, no tenderness/mass/nodules Back: symmetric, no curvature. ROM normal. No CVA tenderness. Lungs: clear to auscultation bilaterally Heart: regular rate and rhythm, S1, S2 normal, no murmur, click, rub or gallop Abdomen: soft, non-tender; bowel sounds normal; no masses,  no organomegaly Pulses: 2+ and symmetric Skin: Skin color, texture, turgor normal. No rashes or lesions Lymph nodes: Cervical, supraclavicular, and axillary nodes normal.  Assessment and Plan:  Obesity Weight gain addressed. I have addressed  BMI and recommended wt loss of 10% of body weigh over the next 6 months using a low glycemic index diet and regular exercise a minimum of 5 days per week.      Hypertension Well controlled on current  regimen. Renal function due, no changes today.  Lab Results  Component Value Date   CREATININE 0.77 07/08/2014   Lab Results  Component Value Date   NA 140 07/08/2014   K 4.5 07/08/2014   CL 106 07/08/2014   CO2 26 07/08/2014      Hypothyroidism Thyroid function is WNL on current dose.  No current changes needed.   Lab Results  Component Value Date   TSH 2.64 07/08/2014     Hyperlipidemia LDL goal <100 LDL and triglycerides are at goal without medications.  No changes today   Lab Results  Component Value Date   CHOL  128 07/08/2014   HDL 32.80* 07/08/2014   LDLCALC 73 07/08/2014   TRIG 112.0 07/08/2014   CHOLHDL 4 07/08/2014      Diabetes mellitus without complication Controlled with diet alone. I have addressed  BMI and recommended a low glycemic index diet utilizing smaller more frequent meals to increase metabolism.  I have also recommended that patient start exercising with a goal of 30 minutes of aerobic exercise a minimum of 5 days per week.  Lab Results  Component Value Date   HGBA1C 6.4 07/08/2014   Lab Results  Component Value Date   MICROALBUR 0.9 10/29/2013       Updated Medication List Outpatient Encounter Prescriptions as of 07/08/2014  Medication Sig  . amLODipine (NORVASC) 10 MG tablet TAKE 1 TABLET BY MOUTH EVERY DAY  . Calcium Carb-Cholecalciferol (CALCIUM 1000 + D PO) Take 1 tablet by mouth daily.    . Cholecalciferol (VITAMIN D) 1000 UNITS capsule Take 1,000 Units by mouth daily.    . fish oil-omega-3 fatty acids 1000 MG capsule Take 2 g by mouth daily.    . furosemide (LASIX) 20 MG tablet Take 1 tablet (20 mg total) by mouth daily as needed (for fluid retention).  Marland Kitchen levothyroxine (SYNTHROID, LEVOTHROID) 25 MCG tablet TAKE 1 TABLET BY MOUTH EVERY DAY  . timolol (BETIMOL) 0.25 % ophthalmic solution Place 1-2 drops into both eyes 2 (two) times daily.  . travoprost, benzalkonium, (TRAVATAN) 0.004 % ophthalmic solution Place 1 drop into both eyes at bedtime.  . [DISCONTINUED] furosemide (LASIX) 20 MG tablet Take 20 mg by mouth daily as needed (for fluid retention).  . triamcinolone cream (KENALOG) 0.1 % Apply 1 application topically 2 (two) times daily. (Patient not taking: Reported on 07/08/2014)  . [DISCONTINUED] amLODipine (NORVASC) 10 MG tablet TAKE 1 TABLET BY MOUTH EVERY DAY     Orders Placed This Encounter  Procedures  . Comprehensive metabolic panel  . Lipid panel  . Vit D  25 hydroxy (rtn osteoporosis monitoring)  . TSH  . Hemoglobin A1c  . Ambulatory referral  to Dermatology    Return in about 6 months (around 01/06/2015).

## 2014-07-08 NOTE — Assessment & Plan Note (Addendum)
Well controlled on current regimen. Renal function due, no changes today.  Lab Results  Component Value Date   CREATININE 0.77 07/08/2014   Lab Results  Component Value Date   NA 140 07/08/2014   K 4.5 07/08/2014   CL 106 07/08/2014   CO2 26 07/08/2014

## 2014-07-08 NOTE — Patient Instructions (Signed)
Diabetes and Exercise Exercising regularly is important. It is not just about losing weight. It has many health benefits, such as:  Improving your overall fitness, flexibility, and endurance.  Increasing your bone density.  Helping with weight control.  Decreasing your body fat.  Increasing your muscle strength.  Reducing stress and tension.  Improving your overall health. People with diabetes who exercise gain additional benefits because exercise:  Reduces appetite.  Improves the body's use of blood sugar (glucose).  Helps lower or control blood glucose.  Decreases blood pressure.  Helps control blood lipids (such as cholesterol and triglycerides).  Improves the body's use of the hormone insulin by:  Increasing the body's insulin sensitivity.  Reducing the body's insulin needs.  Decreases the risk for heart disease because exercising:  Lowers cholesterol and triglycerides levels.  Increases the levels of good cholesterol (such as high-density lipoproteins [HDL]) in the body.  Lowers blood glucose levels. YOUR ACTIVITY PLAN  Choose an activity that you enjoy and set realistic goals. Your health care provider or diabetes educator can help you make an activity plan that works for you. Exercise regularly as directed by your health care provider. This includes:  Performing resistance training twice a week such as push-ups, sit-ups, lifting weights, or using resistance bands.  Performing 150 minutes of cardio exercises each week such as walking, running, or playing sports.  Staying active and spending no more than 90 minutes at one time being inactive. Even short bursts of exercise are good for you. Three 10-minute sessions spread throughout the day are just as beneficial as a single 30-minute session. Some exercise ideas include:  Taking the dog for a walk.  Taking the stairs instead of the elevator.  Dancing to your favorite song.  Doing an exercise  video.  Doing your favorite exercise with a friend. RECOMMENDATIONS FOR EXERCISING WITH TYPE 1 OR TYPE 2 DIABETES   Check your blood glucose before exercising. If blood glucose levels are greater than 240 mg/dL, check for urine ketones. Do not exercise if ketones are present.  Avoid injecting insulin into areas of the body that are going to be exercised. For example, avoid injecting insulin into:  The arms when playing tennis.  The legs when jogging.  Keep a record of:  Food intake before and after you exercise.  Expected peak times of insulin action.  Blood glucose levels before and after you exercise.  The type and amount of exercise you have done.  Review your records with your health care provider. Your health care provider will help you to develop guidelines for adjusting food intake and insulin amounts before and after exercising.  If you take insulin or oral hypoglycemic agents, watch for signs and symptoms of hypoglycemia. They include:  Dizziness.  Shaking.  Sweating.  Chills.  Confusion.  Drink plenty of water while you exercise to prevent dehydration or heat stroke. Body water is lost during exercise and must be replaced.  Talk to your health care provider before starting an exercise program to make sure it is safe for you. Remember, almost any type of activity is better than none. Document Released: 09/01/2003 Document Revised: 10/26/2013 Document Reviewed: 11/18/2012 [employer] Patient Information 2015 [employer]. This information is not intended to replace advice given to you by your health care provider. Make sure you discuss any questions you have with your health care provider.  

## 2014-07-08 NOTE — Assessment & Plan Note (Signed)
Weight gain addressed. I have addressed  BMI and recommended wt loss of 10% of body weigh over the next 6 months using a low glycemic index diet and regular exercise a minimum of 5 days per week.   

## 2014-07-11 ENCOUNTER — Encounter: Payer: Self-pay | Admitting: Internal Medicine

## 2014-07-11 NOTE — Assessment & Plan Note (Signed)
Thyroid function is WNL on current dose.  No current changes needed.   Lab Results  Component Value Date   TSH 2.64 07/08/2014

## 2014-07-11 NOTE — Assessment & Plan Note (Signed)
LDL and triglycerides are at goal without medications.  No changes today   Lab Results  Component Value Date   CHOL 128 07/08/2014   HDL 32.80* 07/08/2014   LDLCALC 73 07/08/2014   TRIG 112.0 07/08/2014   CHOLHDL 4 07/08/2014

## 2014-07-11 NOTE — Assessment & Plan Note (Signed)
Controlled with diet alone. I have addressed  BMI and recommended a low glycemic index diet utilizing smaller more frequent meals to increase metabolism.  I have also recommended that patient start exercising with a goal of 30 minutes of aerobic exercise a minimum of 5 days per week.  Lab Results  Component Value Date   HGBA1C 6.4 07/08/2014   Lab Results  Component Value Date   MICROALBUR 0.9 10/29/2013

## 2014-07-12 ENCOUNTER — Encounter: Payer: Self-pay | Admitting: *Deleted

## 2014-07-26 ENCOUNTER — Telehealth: Payer: Self-pay | Admitting: Internal Medicine

## 2014-07-26 ENCOUNTER — Other Ambulatory Visit: Payer: Self-pay | Admitting: *Deleted

## 2014-07-26 MED ORDER — AMLODIPINE BESYLATE 10 MG PO TABS: ORAL_TABLET | ORAL | Status: DC

## 2014-07-26 NOTE — Telephone Encounter (Signed)
amLODipine (NORVASC) 10 MG tablet #90

## 2014-07-30 DIAGNOSIS — D485 Neoplasm of uncertain behavior of skin: Secondary | ICD-10-CM | POA: Diagnosis not present

## 2014-07-30 DIAGNOSIS — L820 Inflamed seborrheic keratosis: Secondary | ICD-10-CM | POA: Diagnosis not present

## 2014-08-02 ENCOUNTER — Other Ambulatory Visit: Payer: Self-pay | Admitting: Internal Medicine

## 2014-10-01 DIAGNOSIS — R062 Wheezing: Secondary | ICD-10-CM | POA: Diagnosis not present

## 2014-10-01 DIAGNOSIS — R05 Cough: Secondary | ICD-10-CM | POA: Diagnosis not present

## 2014-10-01 DIAGNOSIS — J209 Acute bronchitis, unspecified: Secondary | ICD-10-CM | POA: Diagnosis not present

## 2014-10-07 ENCOUNTER — Ambulatory Visit (INDEPENDENT_AMBULATORY_CARE_PROVIDER_SITE_OTHER): Payer: Medicare Other | Admitting: Internal Medicine

## 2014-10-07 ENCOUNTER — Encounter: Payer: Self-pay | Admitting: Internal Medicine

## 2014-10-07 VITALS — BP 128/78 | HR 64 | Temp 97.5°F | Resp 14 | Ht 62.0 in | Wt 184.2 lb

## 2014-10-07 DIAGNOSIS — E1169 Type 2 diabetes mellitus with other specified complication: Secondary | ICD-10-CM

## 2014-10-07 DIAGNOSIS — E034 Atrophy of thyroid (acquired): Secondary | ICD-10-CM

## 2014-10-07 DIAGNOSIS — R748 Abnormal levels of other serum enzymes: Secondary | ICD-10-CM | POA: Diagnosis not present

## 2014-10-07 DIAGNOSIS — R059 Cough, unspecified: Secondary | ICD-10-CM

## 2014-10-07 DIAGNOSIS — E038 Other specified hypothyroidism: Secondary | ICD-10-CM | POA: Diagnosis not present

## 2014-10-07 DIAGNOSIS — E119 Type 2 diabetes mellitus without complications: Secondary | ICD-10-CM

## 2014-10-07 DIAGNOSIS — Z0000 Encounter for general adult medical examination without abnormal findings: Secondary | ICD-10-CM | POA: Diagnosis not present

## 2014-10-07 DIAGNOSIS — Z1239 Encounter for other screening for malignant neoplasm of breast: Secondary | ICD-10-CM

## 2014-10-07 DIAGNOSIS — E785 Hyperlipidemia, unspecified: Secondary | ICD-10-CM

## 2014-10-07 DIAGNOSIS — E669 Obesity, unspecified: Secondary | ICD-10-CM

## 2014-10-07 DIAGNOSIS — I10 Essential (primary) hypertension: Secondary | ICD-10-CM | POA: Diagnosis not present

## 2014-10-07 DIAGNOSIS — R05 Cough: Secondary | ICD-10-CM

## 2014-10-07 DIAGNOSIS — E1159 Type 2 diabetes mellitus with other circulatory complications: Secondary | ICD-10-CM

## 2014-10-07 DIAGNOSIS — Z1159 Encounter for screening for other viral diseases: Secondary | ICD-10-CM | POA: Diagnosis not present

## 2014-10-07 LAB — COMPREHENSIVE METABOLIC PANEL
ALBUMIN: 3.9 g/dL (ref 3.5–5.2)
ALT: 66 U/L — ABNORMAL HIGH (ref 0–35)
AST: 50 U/L — ABNORMAL HIGH (ref 0–37)
Alkaline Phosphatase: 75 U/L (ref 39–117)
BUN: 12 mg/dL (ref 6–23)
CO2: 31 mEq/L (ref 19–32)
Calcium: 9.7 mg/dL (ref 8.4–10.5)
Chloride: 104 mEq/L (ref 96–112)
Creatinine, Ser: 0.83 mg/dL (ref 0.40–1.20)
GFR: 70.25 mL/min (ref >60.00)
Glucose, Bld: 107 mg/dL — ABNORMAL HIGH (ref 70–99)
POTASSIUM: 5.3 meq/L — AB (ref 3.5–5.1)
Sodium: 139 mEq/L (ref 135–145)
TOTAL PROTEIN: 7.6 g/dL (ref 6.0–8.3)
Total Bilirubin: 0.5 mg/dL (ref 0.2–1.2)

## 2014-10-07 LAB — LIPID PANEL
CHOLESTEROL: 114 mg/dL (ref 0–200)
HDL: 34.3 mg/dL — ABNORMAL LOW (ref >39.00)
LDL Cholesterol: 45 mg/dL (ref 0–99)
NonHDL: 79.7
Total CHOL/HDL Ratio: 3
Triglycerides: 172 mg/dL — ABNORMAL HIGH (ref 0.0–149.0)
VLDL: 34.4 mg/dL (ref 0.0–40.0)

## 2014-10-07 LAB — MICROALBUMIN / CREATININE URINE RATIO
CREATININE, U: 129.6 mg/dL
MICROALB/CREAT RATIO: 0.5 mg/g (ref 0.0–30.0)
Microalb, Ur: 0.7 mg/dL (ref 0.0–1.9)

## 2014-10-07 LAB — TSH: TSH: 2.35 u[IU]/mL (ref 0.35–4.50)

## 2014-10-07 LAB — HEMOGLOBIN A1C: Hgb A1c MFr Bld: 6.5 % (ref 4.6–6.5)

## 2014-10-07 NOTE — Progress Notes (Addendum)
Patient ID: Lauren Morris, female   DOB: Jul 11, 1931, 81 y.o.   MRN: [id number]  Patient Active Problem List   Diagnosis Date Noted  . Elevated liver enzymes 10/09/2014  . Cough 10/09/2014  . Obesity 11/13/2012  . Diabetes mellitus without complication 85/63/1497  . Medicare annual wellness visit, subsequent 03/11/2012  . Hyperlipidemia LDL goal <100 04/15/2011  . Hypertension 02/22/2011  . Hypothyroidism 02/22/2011    Subjective:  CC:   Chief Complaint  Patient presents with  . Annual Exam    Patient was walkin clinic 10/01/14 for cough congestion given prednisone taper and Levofloxcin patient has completed both.    HPI:   The patient is here for annual Medicare wellness examination and management of other chronic and acute problems, including follow up on type 2 DM,  obesity ,  Hypertension, hyperlipidemia and hypothyroidism.  She is recovering from sinusitis,  She was treated with levaquin and prednisone by [facility]  On April 8 for 5 day history of  sinus congestion with excessive drainage and nausea without vomiting.  She tolerated the medication .  Has not had any diarrhea , despite not taking a probiotic,  Her cough still present but her nasal drainage is clear and sinuses are now nontender.  DM:  She  feels generally well, is not exercising or checking blood sugars on a regular basis. .  BS have been under 130 fasting and < 150 post prandially when she does check them .  Denies any recent hypoglyemic events.  Taking her medications as directed. Following a carbohydrate modified diet 6 days per week. Denies numbness, burning and tingling of extremities. Appetite is good.       The risk factors are reflected in the social history.  The roster of all physicians providing medical care to patient - is listed in the Snapshot section of the chart.  Activities of daily living:  The patient is 100% independent in all ADLs: dressing, toileting, feeding as well as independent  mobility  Home safety : The patient has smoke detectors in the home. They wear seatbelts.  There are no firearms at home. There is no violence in the home.   There is no risks for hepatitis, STDs or HIV. There is no   history of blood transfusion. They have no travel history to infectious disease endemic areas of the world.  The patient has seen their dentist in the last six month. They have seen their eye doctor in the last year. They admit to slight hearing difficulty with regard to whispered voices and some television programs.  They have deferred audiologic testing in the last year.  They do not  have excessive sun exposure. Discussed the need for sun protection: hats, long sleeves and use of sunscreen if there is significant sun exposure.   Diet: the importance of a healthy diet is discussed. They do have a healthy diet.  The benefits of regular aerobic exercise were discussed. She has not been exercising regularly.   Depression screen: there are no signs or vegative symptoms of depression- irritability, change in appetite, anhedonia, sadness/tearfullness.  Cognitive assessment: the patient manages all their financial and personal affairs and is actively engaged. They could relate day,date,year and events; recalled 2/3 objects at 3 minutes; performed clock-face test normally.  The following portions of the patient's history were reviewed and updated as appropriate: allergies, current medications, past family history, past medical history,  past surgical history, past social history  and problem list.  Visual acuity was not assessed per patient preference since she has regular follow up with her ophthalmologist. Hearing and body mass index were assessed and reviewed.   During the course of the visit the patient was educated and counseled about appropriate screening and preventive services including : fall prevention , diabetes screening, nutrition counseling, colorectal cancer screening, and  recommended immunizations.       Past Medical History  Diagnosis Date  . Glaucoma   . Hypertension   . Thyroid disease   . Cyst     hx o on right breast  . DJD (degenerative joint disease)     right knee    Past Surgical History  Procedure Laterality Date  . Vitrectomy  July 2012    right eye with membrane peel [name], [place])  . Abdominal hysterectomy         The following portions of the patient's history were reviewed and updated as appropriate: Allergies, current medications, and problem list.    Review of Systems:   Patient denies headache, fevers, malaise, unintentional weight loss, skin rash, eye pain,dysphagia,  hemoptysis ,dyspnea, wheezing, chest pain, palpitations, orthopnea, edema, abdominal pain, nausea, melena, diarrhea, constipation, flank pain, dysuria, hematuria, urinary  Frequency, nocturia, numbness, tingling, seizures,  Focal weakness, Loss of consciousness,  Tremor, insomnia, depression, anxiety, and suicidal ideation.     History   Social History  . Marital Status: Widowed    Spouse Name: N/A  . Number of Children: N/A  . Years of Education: N/A   Occupational History  . Not on file.   Social History Main Topics  . Smoking status: Never Smoker   . Smokeless tobacco: Never Used  . Alcohol Use: No  . Drug Use: No  . Sexual Activity: Not on file   Other Topics Concern  . Not on file   Social History Narrative    Objective:  Filed Vitals:   10/07/14 0923  BP: 128/78  Morris: 64  Temp: 97.5 F (36.4 C)  Resp: 14   General appearance: alert, cooperative and appears stated age Head: Normocephalic, without obvious abnormality, atraumatic Eyes: conjunctivae/corneas clear. PERRL, EOM's intact. Fundi benign. Ears: normal TM's and external ear canals both ears Nose: Nares normal. Septum midline. Mucosa normal. No drainage or sinus tenderness. Throat: lips, mucosa, and tongue normal; teeth and gums normal Neck: no adenopathy, no  carotid bruit, no JVD, supple, symmetrical, trachea midline and thyroid not enlarged, symmetric, no tenderness/mass/nodules Lungs: clear to auscultation bilaterally Breasts: normal appearance, no masses or tenderness Heart: regular rate and rhythm, S1, S2 normal, no murmur, click, rub or gallop Abdomen: soft, non-tender; bowel sounds normal; no masses,  no organomegaly Extremities: extremities normal, atraumatic, no cyanosis or edema Pulses: 2+ and symmetric Skin: Skin color, texture, turgor normal. No rashes or lesions Neurologic: Alert and oriented X 3, normal strength and tone. Normal symmetric reflexes. Normal coordination and gait. Foot exam:  Nails are well trimmed,  No callouses,  Sensation intact to microfilament     Assessment and Plan:  Problem List Items Addressed This Visit      Unprioritized   Hypertension    Well controlled on current regimen. Renal function stable, butt potassium is slightly elevated.  Will repeat in one week, .  BP 128/78 mmHg  Morris 64  Temp(Src) 97.5 F (36.4 C) (Oral)  Resp 14  Ht _0  (1.575 m)  Wt 184 lb 4 oz (83.575 kg)  BMI 33.69 kg/m2  SpO2 94%  Lab Results  Component Value Date   CREATININE 0.83 10/07/2014   Lab Results  Component Value Date   NA 139 10/07/2014   K 5.3* 10/07/2014   CL 104 10/07/2014   CO2 31 10/07/2014         Relevant Orders   Comprehensive metabolic panel (Completed)   Hypothyroidism    Thyroid function is WNL on current dose.  No current changes needed.   Lab Results  Component Value Date   TSH 2.35 10/07/2014         Relevant Orders   TSH (Completed)   Hepatitis C antibody (Completed)   Hyperlipidemia LDL goal <100    LDL and triglycerides are at goal without medications.  No changes today   Lab Results  Component Value Date   CHOL 114 10/07/2014   HDL 34.30* 10/07/2014   LDLCALC 45 10/07/2014   TRIG 172.0* 10/07/2014   CHOLHDL 3 10/07/2014           Medicare annual wellness  visit, subsequent    Annual Medicare wellness  exam was done as well as a comprehensive physical exam and management of acute and chronic conditions .  During the course of the visit the patient was educated and counseled about appropriate screening and preventive services including : fall prevention , diabetes screening, nutrition counseling, colorectal cancer screening, and recommended immunizations.  Printed recommendations for health maintenance screenings was given.       Diabetes mellitus without complication    Controlled with diet alone. I have addressed  BMI and recommended a low glycemic index diet utilizing smaller more frequent meals to increase metabolism.  I have also recommended that patient start exercising with a goal of 30 minutes of aerobic exercise a minimum of 5 days per week.  Lab Results  Component Value Date   HGBA1C 6.5 10/07/2014   Lab Results  Component Value Date   MICROALBUR 0.7 10/07/2014           Obesity    I have congratulated her in reduction of   BMI and encouraged  Continued weight loss with goal of 10% of body weigh over the next 6 months using a low glycemic index diet and regular exercise a minimum of 5 days per week.    Wt Readings from Last 3 Encounters:  10/07/14 184 lb 4 oz (83.575 kg)  07/08/14 192 lb (87.091 kg)  10/29/13 184 lb 12 oz (83.802 kg)        Elevated liver enzymes     elevated for unclear reasons.  Suspect fatty lliver given metabolic syndrome of obesity, type 2 DM and mild hypertriglcyeridema.   I recommend that she repeat the CMETand if still abnormal will ask her to  return  for additional blood tests to rule out various infections and autoimmune disorders tand have an ultrasound of the liver      Relevant Orders   Comp Met (CMET)   Cough    Secondary to recent sinusitis,  Recommend use of probiotic for 3 weeks given recent abx use,  And adding antihistamine for management of cough secondary to post nasal drip        Other Visit Diagnoses    Breast cancer screening    -  Primary    Relevant Orders    MM DIGITAL SCREENING BILATERAL    Obesity, diabetes, and hypertension syndrome        Relevant Orders    Hemoglobin A1c (Completed)    Lipid panel (Completed)  Microalbumin / creatinine urine ratio (Completed)    Need for hepatitis C screening test        Relevant Orders    Hepatitis C antibody (Completed)

## 2014-10-07 NOTE — Patient Instructions (Signed)
Please take a probiotic ( Align, Floraque or Culturelle) or the generic equivalent for a minimum of  two weeks  SINCE YOU HAD  recent use of  the antibiotic.  This is  to prevent a serious antibiotic associated diarrhea  Called clostridium dificile colitis     Here are the names of several otc antihistamines for allergies  That you should consider taking one for the next few weeks   To help your cough :  Allegra (fexodenadine)  180 mg daily  Zyrtec (cetirizine)  10 mg daily (may cause drowsiness)  claritin (loratidine) 10 mg daily  Benadryl (dipenhydramine) 25 mg has to be taken every 8 hours.,  Very sedating to most people, so you could try just taking it at night   You can also try nasocort,  Available OTC as a  Steroid nasal spray as an alternative to a pill for your allergies and cough  YOU HAVE LOST 8 LBS!!!  Great job!!!  We see you every 4 months because you have Diet controlled Diabetes.  If you A1c is still < 6.5,  We will see you in 6 months.  Don't forget to get your annual eye exam for diabetics.    Health Maintenance Adopting a healthy lifestyle and getting preventive care can go a long way to promote health and wellness. Talk with your health care provider about what schedule of regular examinations is right for you. This is a good chance for you to check in with your provider about disease prevention and staying healthy. In between checkups, there are plenty of things you can do on your own. Experts have done a lot of research about which lifestyle changes and preventive measures are most likely to keep you healthy. Ask your health care provider for more information. WEIGHT AND DIET  Eat a healthy diet  Be sure to include plenty of vegetables, fruits, low-fat dairy products, and lean protein.  Do not eat a lot of foods high in solid fats, added sugars, or salt.  Get regular exercise. This is one of the most important things you can do for your health.  Most adults should  exercise for at least 150 minutes each week. The exercise should increase your heart rate and make you sweat (moderate-intensity exercise).  Most adults should also do strengthening exercises at least twice a week. This is in addition to the moderate-intensity exercise.  Maintain a healthy weight  Body mass index (BMI) is a measurement that can be used to identify possible weight problems. It estimates body fat based on height and weight. Your health care provider can help determine your BMI and help you achieve or maintain a healthy weight.  For females 30 years of age and older:   A BMI below 18.5 is considered underweight.  A BMI of 18.5 to 24.9 is normal.  A BMI of 25 to 29.9 is considered overweight.  A BMI of 30 and above is considered obese.  Watch levels of cholesterol and blood lipids  You should start having your blood tested for lipids and cholesterol at 79 years of age, then have this test every 5 years.  You may need to have your cholesterol levels checked more often if:  Your lipid or cholesterol levels are high.  You are older than 79 years of age.  You are at high risk for heart disease.  CANCER SCREENING   Lung Cancer  Lung cancer screening is recommended for adults 18-70 years old who are at high  risk for lung cancer because of a history of smoking.  A yearly low-dose CT scan of the lungs is recommended for people who:  Currently smoke.  Have quit within the past 15 years.  Have at least a 30-pack-year history of smoking. A pack year is smoking an average of one pack of cigarettes a day for 1 year.  Yearly screening should continue until it has been 15 years since you quit.  Yearly screening should stop if you develop a health problem that would prevent you from having lung cancer treatment.  Breast Cancer  Practice breast self-awareness. This means understanding how your breasts normally appear and feel.  It also means doing regular breast  self-exams. Let your health care provider know about any changes, no matter how small.  If you are in your 20s or 30s, you should have a clinical breast exam (CBE) by a health care provider every 1-3 years as part of a regular health exam.  If you are 43 or older, have a CBE every year. Also consider having a breast X-ray (mammogram) every year.  If you have a family history of breast cancer, talk to your health care provider about genetic screening.  If you are at high risk for breast cancer, talk to your health care provider about having an MRI and a mammogram every year.  Breast cancer gene (BRCA) assessment is recommended for women who have family members with BRCA-related cancers. BRCA-related cancers include:  Breast.  Ovarian.  Tubal.  Peritoneal cancers.  Results of the assessment will determine the need for genetic counseling and BRCA1 and BRCA2 testing. Cervical Cancer Routine pelvic examinations to screen for cervical cancer are no longer recommended for nonpregnant women who are considered low risk for cancer of the pelvic organs (ovaries, uterus, and vagina) and who do not have symptoms. A pelvic examination may be necessary if you have symptoms including those associated with pelvic infections. Ask your health care provider if a screening pelvic exam is right for you.   The Pap test is the screening test for cervical cancer for women who are considered at risk.  If you had a hysterectomy for a problem that was not cancer or a condition that could lead to cancer, then you no longer need Pap tests.  If you are older than 65 years, and you have had normal Pap tests for the past 10 years, you no longer need to have Pap tests.  If you have had past treatment for cervical cancer or a condition that could lead to cancer, you need Pap tests and screening for cancer for at least 20 years after your treatment.  If you no longer get a Pap test, assess your risk factors if they  change (such as having a new sexual partner). This can affect whether you should start being screened again.  Some women have medical problems that increase their chance of getting cervical cancer. If this is the case for you, your health care provider may recommend more frequent screening and Pap tests.  The human papillomavirus (HPV) test is another test that may be used for cervical cancer screening. The HPV test looks for the virus that can cause cell changes in the cervix. The cells collected during the Pap test can be tested for HPV.  The HPV test can be used to screen women 50 years of age and older. Getting tested for HPV can extend the interval between normal Pap tests from three to five years.  An  HPV test also should be used to screen women of any age who have unclear Pap test results.  After 79 years of age, women should have HPV testing as often as Pap tests.  Colorectal Cancer  This type of cancer can be detected and often prevented.  Routine colorectal cancer screening usually begins at 79 years of age and continues through 79 years of age.  Your health care provider may recommend screening at an earlier age if you have risk factors for colon cancer.  Your health care provider may also recommend using home test kits to check for hidden blood in the stool.  A small camera at the end of a tube can be used to examine your colon directly (sigmoidoscopy or colonoscopy). This is done to check for the earliest forms of colorectal cancer.  Routine screening usually begins at age 62.  Direct examination of the colon should be repeated every 5-10 years through 79 years of age. However, you may need to be screened more often if early forms of precancerous polyps or small growths are found. Skin Cancer  Check your skin from head to toe regularly.  Tell your health care provider about any new moles or changes in moles, especially if there is a change in a mole's shape or  color.  Also tell your health care provider if you have a mole that is larger than the size of a pencil eraser.  Always use sunscreen. Apply sunscreen liberally and repeatedly throughout the day.  Protect yourself by wearing long sleeves, pants, a wide-brimmed hat, and sunglasses whenever you are outside. HEART DISEASE, DIABETES, AND HIGH BLOOD PRESSURE   Have your blood pressure checked at least every 1-2 years. High blood pressure causes heart disease and increases the risk of stroke.  If you are between 58 years and 28 years old, ask your health care provider if you should take aspirin to prevent strokes.  Have regular diabetes screenings. This involves taking a blood sample to check your fasting blood sugar level.  If you are at a normal weight and have a low risk for diabetes, have this test once every three years after 79 years of age.  If you are overweight and have a high risk for diabetes, consider being tested at a younger age or more often. PREVENTING INFECTION  Hepatitis B  If you have a higher risk for hepatitis B, you should be screened for this virus. You are considered at high risk for hepatitis B if:  You were born in a country where hepatitis B is common. Ask your health care provider which countries are considered high risk.  Your parents were born in a high-risk country, and you have not been immunized against hepatitis B (hepatitis B vaccine).  You have HIV or AIDS.  You use needles to inject street drugs.  You live with someone who has hepatitis B.  You have had sex with someone who has hepatitis B.  You get hemodialysis treatment.  You take certain medicines for conditions, including cancer, organ transplantation, and autoimmune conditions. Hepatitis C  Blood testing is recommended for:  Everyone born from [birth year] through 1963.  Anyone with known risk factors for hepatitis C. Sexually transmitted infections (STIs)  You should be screened for sexually  transmitted infections (STIs) including gonorrhea and chlamydia if:  You are sexually active and are younger than 79 years of age.  You are older than 79 years of age and your health care provider tells you that you  are at risk for this type of infection.  Your sexual activity has changed since you were last screened and you are at an increased risk for chlamydia or gonorrhea. Ask your health care provider if you are at risk.  If you do not have HIV, but are at risk, it may be recommended that you take a prescription medicine daily to prevent HIV infection. This is called pre-exposure prophylaxis (PrEP). You are considered at risk if:  You are sexually active and do not regularly use condoms or know the HIV status of your partner(s).  You take drugs by injection.  You are sexually active with a partner who has HIV. Talk with your health care provider about whether you are at high risk of being infected with HIV. If you choose to begin PrEP, you should first be tested for HIV. You should then be tested every 3 months for as long as you are taking PrEP.  PREGNANCY   If you are premenopausal and you may become pregnant, ask your health care provider about preconception counseling.  If you may become pregnant, take 400 to 800 micrograms (mcg) of folic acid every day.  If you want to prevent pregnancy, talk to your health care provider about birth control (contraception). OSTEOPOROSIS AND MENOPAUSE   Osteoporosis is a disease in which the bones lose minerals and strength with aging. This can result in serious bone fractures. Your risk for osteoporosis can be identified using a bone density scan.  If you are 38 years of age or older, or if you are at risk for osteoporosis and fractures, ask your health care provider if you should be screened.  Ask your health care provider whether you should take a calcium or vitamin D supplement to lower your risk for osteoporosis.  Menopause may have  certain physical symptoms and risks.  Hormone replacement therapy may reduce some of these symptoms and risks. Talk to your health care provider about whether hormone replacement therapy is right for you.  HOME CARE INSTRUCTIONS   Schedule regular health, dental, and eye exams.  Stay current with your immunizations.   Do not use any tobacco products including cigarettes, chewing tobacco, or electronic cigarettes.  If you are pregnant, do not drink alcohol.  If you are breastfeeding, limit how much and how often you drink alcohol.  Limit alcohol intake to no more than 1 drink per day for nonpregnant women. One drink equals 12 ounces of beer, 5 ounces of wine, or 1 ounces of hard liquor.  Do not use street drugs.  Do not share needles.  Ask your health care provider for help if you need support or information about quitting drugs.  Tell your health care provider if you often feel depressed.  Tell your health care provider if you have ever been abused or do not feel safe at home. Document Released: 12/25/2010 Document Revised: 10/26/2013 Document Reviewed: 05/13/2013 [facility] Patient Information 2015 [place], [place]. This information is not intended to replace advice given to you by your health care provider. Make sure you discuss any questions you have with your health care provider.

## 2014-10-07 NOTE — Progress Notes (Signed)
Pre-visit discussion using our clinic review tool. No additional management support is needed unless otherwise documented below in the visit note.  

## 2014-10-08 LAB — HEPATITIS C ANTIBODY: HCV Ab: NEGATIVE

## 2014-10-09 DIAGNOSIS — K7460 Unspecified cirrhosis of liver: Secondary | ICD-10-CM | POA: Insufficient documentation

## 2014-10-09 DIAGNOSIS — R059 Cough, unspecified: Secondary | ICD-10-CM | POA: Insufficient documentation

## 2014-10-09 DIAGNOSIS — R05 Cough: Secondary | ICD-10-CM | POA: Insufficient documentation

## 2014-10-09 DIAGNOSIS — K760 Fatty (change of) liver, not elsewhere classified: Secondary | ICD-10-CM | POA: Insufficient documentation

## 2014-10-09 NOTE — Assessment & Plan Note (Signed)
Well controlled on current regimen. Renal function stable, butt potassium is slightly elevated.  Will repeat in one week, .  BP 128/78 mmHg  Pulse 64  Temp(Src) 97.5 F (36.4 C) (Oral)  Resp 14  Ht 5\' 2"  (1.575 m)  Wt 184 lb 4 oz (83.575 kg)  BMI 33.69 kg/m2  SpO2 94%  Lab Results  Component Value Date   CREATININE 0.83 10/07/2014   Lab Results  Component Value Date   NA 139 10/07/2014   K 5.3* 10/07/2014   CL 104 10/07/2014   CO2 31 10/07/2014

## 2014-10-09 NOTE — Assessment & Plan Note (Signed)

## 2014-10-09 NOTE — Assessment & Plan Note (Addendum)
Secondary to recent sinusitis,  Recommend use of probiotic for 3 weeks given recent abx use,  And adding antihistamine for management of cough secondary to post nasal drip

## 2014-10-09 NOTE — Assessment & Plan Note (Signed)
LDL and triglycerides are at goal without medications.  No changes today   Lab Results  Component Value Date   CHOL 114 10/07/2014   HDL 34.30* 10/07/2014   LDLCALC 45 10/07/2014   TRIG 172.0* 10/07/2014   CHOLHDL 3 10/07/2014

## 2014-10-09 NOTE — Assessment & Plan Note (Signed)
Controlled with diet alone. I have addressed  BMI and recommended a low glycemic index diet utilizing smaller more frequent meals to increase metabolism.  I have also recommended that patient start exercising with a goal of 30 minutes of aerobic exercise a minimum of 5 days per week.  Lab Results  Component Value Date   HGBA1C 6.5 10/07/2014   Lab Results  Component Value Date   MICROALBUR 0.7 10/07/2014

## 2014-10-09 NOTE — Assessment & Plan Note (Signed)
elevated for unclear reasons.  Suspect fatty lliver given metabolic syndrome of obesity, type 2 DM and mild hypertriglcyeridema.   I recommend that she repeat the CMETand if still abnormal will ask her to  return  for additional blood tests to rule out various infections and autoimmune disorders tand have an ultrasound of the liver

## 2014-10-09 NOTE — Assessment & Plan Note (Signed)
I have congratulated her in reduction of   BMI and encouraged  Continued weight loss with goal of 10% of body weigh over the next 6 months using a low glycemic index diet and regular exercise a minimum of 5 days per week.    Wt Readings from Last 3 Encounters:  10/07/14 184 lb 4 oz (83.575 kg)  07/08/14 192 lb (87.091 kg)  10/29/13 184 lb 12 oz (83.802 kg)

## 2014-10-09 NOTE — Assessment & Plan Note (Signed)
Thyroid function is WNL on current dose.  No current changes needed.   Lab Results  Component Value Date   TSH 2.35 10/07/2014

## 2014-10-10 NOTE — Addendum Note (Signed)
Addended by: [name] on: 10/10/2014 02:29 PM   Modules accepted: Level of Service

## 2014-10-12 ENCOUNTER — Other Ambulatory Visit (INDEPENDENT_AMBULATORY_CARE_PROVIDER_SITE_OTHER): Payer: Medicare Other

## 2014-10-12 DIAGNOSIS — R748 Abnormal levels of other serum enzymes: Secondary | ICD-10-CM

## 2014-10-12 LAB — COMPREHENSIVE METABOLIC PANEL
ALT: 44 U/L — ABNORMAL HIGH (ref 0–35)
AST: 33 U/L (ref 0–37)
Albumin: 4 g/dL (ref 3.5–5.2)
Alkaline Phosphatase: 85 U/L (ref 39–117)
BUN: 17 mg/dL (ref 6–23)
CALCIUM: 9.7 mg/dL (ref 8.4–10.5)
CO2: 29 meq/L (ref 19–32)
Chloride: 104 mEq/L (ref 96–112)
Creatinine, Ser: 0.75 mg/dL (ref 0.40–1.20)
GFR: 78.96 mL/min (ref >60.00)
Glucose, Bld: 102 mg/dL — ABNORMAL HIGH (ref 70–99)
POTASSIUM: 5.1 meq/L (ref 3.5–5.1)
SODIUM: 137 meq/L (ref 135–145)
Total Bilirubin: 0.5 mg/dL (ref 0.2–1.2)
Total Protein: 7.1 g/dL (ref 6.0–8.3)

## 2014-10-14 NOTE — Addendum Note (Signed)
Addended by: [name] on: 10/14/2014 02:30 PM   Modules accepted: Orders

## 2014-10-14 NOTE — Assessment & Plan Note (Signed)
liver enzyme improved but remains elevated for unclear reasons.   I recommend that yshereturn  for additional blood tests to rule out various infections and autoimmune disorders that can elevated liver enzymes (hepatitis, hemochromatosis, etc)

## 2014-10-15 ENCOUNTER — Encounter: Payer: Self-pay | Admitting: *Deleted

## 2014-11-11 DIAGNOSIS — H35371 Puckering of macula, right eye: Secondary | ICD-10-CM | POA: Diagnosis not present

## 2014-11-11 DIAGNOSIS — H4011X1 Primary open-angle glaucoma, mild stage: Secondary | ICD-10-CM | POA: Diagnosis not present

## 2014-11-17 ENCOUNTER — Other Ambulatory Visit (INDEPENDENT_AMBULATORY_CARE_PROVIDER_SITE_OTHER): Payer: Medicare Other

## 2014-11-17 DIAGNOSIS — R748 Abnormal levels of other serum enzymes: Secondary | ICD-10-CM

## 2014-11-17 LAB — HEPATIC FUNCTION PANEL
ALBUMIN: 4.1 g/dL (ref 3.5–5.2)
ALT: 47 U/L — ABNORMAL HIGH (ref 0–35)
AST: 42 U/L — AB (ref 0–37)
Alkaline Phosphatase: 93 U/L (ref 39–117)
BILIRUBIN TOTAL: 0.5 mg/dL (ref 0.2–1.2)
Bilirubin, Direct: 0.1 mg/dL (ref 0.0–0.3)
Total Protein: 7.2 g/dL (ref 6.0–8.3)

## 2014-11-17 LAB — IRON AND TIBC
%SAT: 41 % (ref 20–55)
Iron: 110 ug/dL (ref 42–145)
TIBC: 269 ug/dL (ref 250–470)
UIBC: 159 ug/dL (ref 125–400)

## 2014-11-17 LAB — FERRITIN: FERRITIN: 119.3 ng/mL (ref 10.0–291.0)

## 2014-11-17 NOTE — Addendum Note (Signed)
Addended by: Johnsie Cancel on: 11/17/2014 09:35 AM   Modules accepted: Orders

## 2014-11-18 LAB — ANTI-SMITH ANTIBODY: ENA SM AB SER-ACNC: NEGATIVE

## 2014-11-18 LAB — HEPATITIS B SURFACE ANTIGEN: HEP B S AG: NEGATIVE

## 2014-11-18 LAB — HEPATITIS B CORE ANTIBODY, TOTAL: Hep B Core Total Ab: NONREACTIVE

## 2014-11-18 LAB — HEPATITIS C ANTIBODY: HCV Ab: NEGATIVE

## 2014-11-18 LAB — ANA: Anti Nuclear Antibody(ANA): NEGATIVE

## 2014-11-18 LAB — HEPATITIS B SURFACE ANTIBODY,QUALITATIVE: Hep B S Ab: NEGATIVE

## 2014-11-21 ENCOUNTER — Other Ambulatory Visit: Payer: Self-pay | Admitting: Internal Medicine

## 2014-11-24 ENCOUNTER — Other Ambulatory Visit: Payer: Self-pay | Admitting: Internal Medicine

## 2014-11-24 ENCOUNTER — Telehealth: Payer: Self-pay | Admitting: *Deleted

## 2014-11-24 NOTE — Telephone Encounter (Signed)
A user error has taken place.

## 2014-11-25 ENCOUNTER — Telehealth: Payer: Self-pay | Admitting: *Deleted

## 2014-11-25 DIAGNOSIS — R748 Abnormal levels of other serum enzymes: Secondary | ICD-10-CM

## 2014-11-25 NOTE — Telephone Encounter (Signed)
Ultrasound has been ordered

## 2014-11-25 NOTE — Telephone Encounter (Signed)
Pt called states she is ok with having the US done.  Please order

## 2014-12-07 ENCOUNTER — Ambulatory Visit: Payer: Medicare Other

## 2014-12-29 ENCOUNTER — Other Ambulatory Visit: Payer: Self-pay | Admitting: Internal Medicine

## 2014-12-29 ENCOUNTER — Ambulatory Visit
Admission: RE | Admit: 2014-12-29 | Discharge: 2014-12-29 | Disposition: A | Discharge status: Home / Self Care | Payer: Medicare Other | Source: Ambulatory Visit | Attending: Internal Medicine | Admitting: Internal Medicine

## 2014-12-29 DIAGNOSIS — R748 Abnormal levels of other serum enzymes: Secondary | ICD-10-CM

## 2014-12-29 DIAGNOSIS — R945 Abnormal results of liver function studies: Secondary | ICD-10-CM | POA: Diagnosis not present

## 2014-12-29 NOTE — Progress Notes (Signed)
Scheduled pt lab appt for July 10th as pt will be out of town

## 2015-02-02 ENCOUNTER — Other Ambulatory Visit (INDEPENDENT_AMBULATORY_CARE_PROVIDER_SITE_OTHER): Payer: Medicare Other

## 2015-02-02 DIAGNOSIS — R748 Abnormal levels of other serum enzymes: Secondary | ICD-10-CM | POA: Diagnosis not present

## 2015-02-02 LAB — HEPATIC FUNCTION PANEL
ALBUMIN: 4 g/dL (ref 3.5–5.2)
ALT: 39 U/L — ABNORMAL HIGH (ref 0–35)
AST: 41 U/L — AB (ref 0–37)
Alkaline Phosphatase: 79 U/L (ref 39–117)
Bilirubin, Direct: 0.1 mg/dL (ref 0.0–0.3)
Total Bilirubin: 0.4 mg/dL (ref 0.2–1.2)
Total Protein: 7.9 g/dL (ref 6.0–8.3)

## 2015-02-03 DIAGNOSIS — Z1231 Encounter for screening mammogram for malignant neoplasm of breast: Secondary | ICD-10-CM | POA: Diagnosis not present

## 2015-02-03 LAB — HM MAMMOGRAPHY: HM Mammogram: NEGATIVE

## 2015-02-04 ENCOUNTER — Other Ambulatory Visit: Payer: Self-pay | Admitting: Internal Medicine

## 2015-02-04 DIAGNOSIS — R748 Abnormal levels of other serum enzymes: Secondary | ICD-10-CM

## 2015-02-07 ENCOUNTER — Encounter: Payer: Self-pay | Admitting: Internal Medicine

## 2015-02-07 ENCOUNTER — Encounter: Payer: Self-pay | Admitting: Gastroenterology

## 2015-02-10 ENCOUNTER — Telehealth: Payer: Self-pay | Admitting: Internal Medicine

## 2015-02-10 NOTE — Telephone Encounter (Signed)
Pt was referred to Dr Faye Ramsay for a colonoscopy, however pt wants to go to Dr Earlean Shawl in [place]. Pt states her daughter as went there as well. Thank You!

## 2015-02-22 ENCOUNTER — Other Ambulatory Visit: Payer: Self-pay | Admitting: Internal Medicine

## 2015-03-04 DIAGNOSIS — R740 Nonspecific elevation of levels of transaminase and lactic acid dehydrogenase [LDH]: Secondary | ICD-10-CM | POA: Diagnosis not present

## 2015-03-04 DIAGNOSIS — R635 Abnormal weight gain: Secondary | ICD-10-CM | POA: Diagnosis not present

## 2015-03-04 DIAGNOSIS — K7581 Nonalcoholic steatohepatitis (NASH): Secondary | ICD-10-CM | POA: Diagnosis not present

## 2015-03-04 DIAGNOSIS — Z315 Encounter for genetic counseling: Secondary | ICD-10-CM | POA: Diagnosis not present

## 2015-04-04 ENCOUNTER — Ambulatory Visit: Payer: Medicare Other | Admitting: Gastroenterology

## 2015-04-07 ENCOUNTER — Telehealth: Payer: Self-pay

## 2015-04-07 NOTE — Telephone Encounter (Signed)
Patient called triage line, Confirmed with patient that she has a appointment with Dr. Derrel Nip tomorrow morning.  Verbalized understanding.

## 2015-04-08 ENCOUNTER — Ambulatory Visit (INDEPENDENT_AMBULATORY_CARE_PROVIDER_SITE_OTHER): Payer: Medicare Other | Admitting: Internal Medicine

## 2015-04-08 ENCOUNTER — Encounter: Payer: Self-pay | Admitting: Internal Medicine

## 2015-04-08 VITALS — BP 128/72 | HR 59 | Temp 97.6°F | Resp 12 | Ht 62.0 in | Wt 182.4 lb

## 2015-04-08 DIAGNOSIS — E785 Hyperlipidemia, unspecified: Secondary | ICD-10-CM

## 2015-04-08 DIAGNOSIS — R748 Abnormal levels of other serum enzymes: Secondary | ICD-10-CM | POA: Diagnosis not present

## 2015-04-08 DIAGNOSIS — E034 Atrophy of thyroid (acquired): Secondary | ICD-10-CM

## 2015-04-08 DIAGNOSIS — E038 Other specified hypothyroidism: Secondary | ICD-10-CM

## 2015-04-08 DIAGNOSIS — I10 Essential (primary) hypertension: Secondary | ICD-10-CM

## 2015-04-08 DIAGNOSIS — I872 Venous insufficiency (chronic) (peripheral): Secondary | ICD-10-CM

## 2015-04-08 DIAGNOSIS — E669 Obesity, unspecified: Secondary | ICD-10-CM | POA: Diagnosis not present

## 2015-04-08 DIAGNOSIS — E119 Type 2 diabetes mellitus without complications: Secondary | ICD-10-CM | POA: Diagnosis not present

## 2015-04-08 LAB — LIPID PANEL
Cholesterol: 140 mg/dL (ref 0–200)
HDL: 34 mg/dL — ABNORMAL LOW (ref >39.00)
LDL CALC: 82 mg/dL (ref 0–99)
NonHDL: 105.94
TRIGLYCERIDES: 122 mg/dL (ref 0.0–149.0)
Total CHOL/HDL Ratio: 4
VLDL: 24.4 mg/dL (ref 0.0–40.0)

## 2015-04-08 LAB — LDL CHOLESTEROL, DIRECT: Direct LDL: 84 mg/dL

## 2015-04-08 LAB — MICROALBUMIN / CREATININE URINE RATIO
Creatinine,U: 134.2 mg/dL
MICROALB/CREAT RATIO: 0.8 mg/g (ref 0.0–30.0)
Microalb, Ur: 1.1 mg/dL (ref 0.0–1.9)

## 2015-04-08 LAB — HEMOGLOBIN A1C: Hgb A1c MFr Bld: 6.1 % (ref 4.6–6.5)

## 2015-04-08 MED ORDER — HEPATITIS A-HEP B RECOMB VAC 720-20 ELU-MCG/ML IM SUSP: INTRAMUSCULAR | Status: DC

## 2015-04-08 NOTE — Assessment & Plan Note (Signed)
I have congratulated her in reduction of   BMI and loss of 10 lbs this your and encouraged  Continued weight loss with goal of 10% of body weight over the next 6 months using a low glycemic index diet and regular exercise a minimum of 5 days per week.

## 2015-04-08 NOTE — Assessment & Plan Note (Signed)
BP 128/72 mmHg  Pulse 59  Temp(Src) 97.6 F (36.4 C) (Oral)  Resp 12  Ht 5\' 2"  (1.575 m)  Wt 182 lb 6 oz (82.725 kg)  BMI 33.35 kg/m2  SpO2 96%  Well controlled on current regimen. Renal function due, o changes today.

## 2015-04-08 NOTE — Progress Notes (Signed)
Pre-visit discussion using our clinic review tool. No additional management support is needed unless otherwise documented below in the visit note.  

## 2015-04-08 NOTE — Progress Notes (Signed)
Subjective:  Patient ID: Lauren Morris, female    DOB: 1931-09-02  Age: 81 y.o. MRN: [id number]  CC: The primary encounter diagnosis was Obesity. Diagnoses of Essential hypertension, Diabetes mellitus without complication ([place]), Elevated liver enzymes, Hyperlipidemia LDL goal <100, Hypothyroidism due to acquired atrophy of thyroid, and Chronic venous insufficiency were also pertinent to this visit.  HPI Lauren Morris presents for 6 month follow up on diabetes.  Patient has no complaints today.  Patient is following a low glycemic index diet and taking all prescribed medications regularly without side effects.  Fasting sugars have been under less than 140 most of the time and post prandials have been under 160 except on rare occasions. Patient is not exercising but goes for a short walk about 3 times per week and intentionally trying to lose weight .  Patient has had an eye exam in the last 12 months and checks feet regularly for signs of infection.  Patient does not walk barefoot outside,  And denies an numbness tingling or burning in feet. Patient is up to date on all recommended vaccinations but has not had te hepa/B vaccinations   Saw [facility] ,  Had labs done for elevated liver enyzmes, next appt nov 11 . Wants her to lose 10 more lbs I have advised her to get the hep a B vaccine    Outpatient Prescriptions Prior to Visit  Medication Sig Dispense Refill  . amLODipine (NORVASC) 10 MG tablet TAKE 1 TABLET BY MOUTH EVERY DAY 30 tablet 2  . Calcium Carb-Cholecalciferol (CALCIUM 1000 + D PO) Take 1 tablet by mouth daily.      . Cholecalciferol (VITAMIN D) 1000 UNITS capsule Take 1,000 Units by mouth daily.      . fish oil-omega-3 fatty acids 1000 MG capsule Take 2 g by mouth daily.      . furosemide (LASIX) 20 MG tablet TAKE 1 TABLET BY MOUTH EVERY DAY AS NEEDED FLUID RETENTION 30 tablet 0  . levothyroxine (SYNTHROID, LEVOTHROID) 25 MCG tablet TAKE 1 TABLET BY MOUTH EVERY DAY 30 tablet 8  . timolol  (BETIMOL) 0.25 % ophthalmic solution Place 1-2 drops into both eyes 2 (two) times daily.    . travoprost, benzalkonium, (TRAVATAN) 0.004 % ophthalmic solution Place 1 drop into both eyes at bedtime.    . triamcinolone cream (KENALOG) 0.1 % Apply 1 application topically 2 (two) times daily. (Patient not taking: Reported on 10/07/2014) 30 g 0   No facility-administered medications prior to visit.    Review of Systems;  Patient denies headache, fevers, malaise, unintentional weight loss, skin rash, eye pain, sinus congestion and sinus pain, sore throat, dysphagia,  hemoptysis , cough, dyspnea, wheezing, chest pain, palpitations, orthopnea, edema, abdominal pain, nausea, melena, diarrhea, constipation, flank pain, dysuria, hematuria, urinary  Frequency, nocturia, numbness, tingling, seizures,  Focal weakness, Loss of consciousness,  Tremor, insomnia, depression, anxiety, and suicidal ideation.      Objective:  BP 128/72 mmHg  Pulse 59  Temp(Src) 97.6 F (36.4 C) (Oral)  Resp 12  Ht 5\' 2"  (1.575 m)  Wt 182 lb 6 oz (82.725 kg)  BMI 33.35 kg/m2  SpO2 96%  BP Readings from Last 3 Encounters:  04/08/15 128/72  10/07/14 128/78  07/08/14 130/70    Wt Readings from Last 3 Encounters:  04/08/15 182 lb 6 oz (82.725 kg)  10/07/14 184 lb 4 oz (83.575 kg)  07/08/14 192 lb (87.091 kg)    General appearance: alert, cooperative and appears stated  age Ears: normal TM's and external ear canals both ears Throat: lips, mucosa, and tongue normal; teeth and gums normal Neck: no adenopathy, no carotid bruit, supple, symmetrical, trachea midline and thyroid not enlarged, symmetric, no tenderness/mass/nodules Back: symmetric, no curvature. ROM normal. No CVA tenderness. Lungs: clear to auscultation bilaterally Heart: regular rate and rhythm, S1, S2 normal, no murmur, click, rub or gallop Abdomen: soft, non-tender; bowel sounds normal; no masses,  no organomegaly Pulses: 2+ and symmetric Skin: Skin  color, texture, turgor normal. No rashes or lesions Lymph nodes: Cervical, supraclavicular, and axillary nodes normal.  Lab Results  Component Value Date   HGBA1C 6.1 04/08/2015   HGBA1C 6.5 10/07/2014   HGBA1C 6.4 07/08/2014    Lab Results  Component Value Date   CREATININE 0.75 10/12/2014   CREATININE 0.83 10/07/2014   CREATININE 0.77 07/08/2014    Lab Results  Component Value Date   WBC 7.8 10/29/2013   HGB 15.3* 10/29/2013   HCT 45.3 10/29/2013   PLT 251.0 10/29/2013   GLUCOSE 102* 10/12/2014   CHOL 140 04/08/2015   TRIG 122.0 04/08/2015   HDL 34.00* 04/08/2015   LDLDIRECT 84.0 04/08/2015   LDLCALC 82 04/08/2015   ALT 39* 02/02/2015   AST 41* 02/02/2015   NA 137 10/12/2014   K 5.1 10/12/2014   CL 104 10/12/2014   CREATININE 0.75 10/12/2014   BUN 17 10/12/2014   CO2 29 10/12/2014   TSH 2.35 10/07/2014   HGBA1C 6.1 04/08/2015   MICROALBUR 1.1 04/08/2015    US Abdomen Complete  12/29/2014  CLINICAL DATA:  Elevated liver enzymes EXAM: ULTRASOUND ABDOMEN COMPLETE COMPARISON:  None. FINDINGS: Gallbladder: No gallstones or wall thickening visualized. There is no pericholecystic fluid. No sonographic Murphy sign noted. Common bile duct: Diameter: 6 mm. There is no intrahepatic, common hepatic, or common bile duct dilatation. Liver: No focal lesion identified. Within normal limits in parenchymal echogenicity. IVC: No abnormality visualized. Pancreas: No mass or inflammatory focus. Spleen: Size and appearance within normal limits. Right Kidney: Length: 10.5 cm. Echogenicity within normal limits. No mass or hydronephrosis visualized. Left Kidney: Length: 11.2 cm. Echogenicity within normal limits. No mass or hydronephrosis visualized. Abdominal aorta: No aneurysm visualized. Other findings: No demonstrable ascites. IMPRESSION: Study within normal limits. Electronically Signed   By: Lowella Grip III M.D.   On: 12/29/2014 09:03    Assessment & Plan:   Problem List Items  Addressed This Visit    Hyperlipidemia LDL goal <100   Relevant Orders   LDL cholesterol, direct (Completed)   Lipid panel (Completed)   Diabetes mellitus without complication ([place])    Controlled with diet alone. I have addressed  BMI and recommended a low glycemic index diet utilizing smaller more frequent meals to increase metabolism.  I have also recommended that patient start exercising with a goal of 30 minutes of aerobic exercise a minimum of 5 days per week.  Lab Results  Component Value Date   HGBA1C 6.1 04/08/2015   Lab Results  Component Value Date   MICROALBUR 1.1 04/08/2015             Relevant Orders   Hemoglobin A1c (Completed)   Microalbumin / creatinine urine ratio (Completed)   Elevated liver enzymes    Persistent without hepatic steatosis seen on ultrasound.  GI evaluation in progress, fatty liver suspected       Hypertension    BP 128/72 mmHg  Pulse 59  Temp(Src) 97.6 F (36.4 C) (Oral)  Resp 12  Ht 5\' 2"  (1.575 m)  Wt 182 lb 6 oz (82.725 kg)  BMI 33.35 kg/m2  SpO2 96%  Well controlled on current regimen. Renal function due, o changes today.      Obesity - Primary    I have congratulated her in reduction of   BMI and loss of 10 lbs this your and encouraged  Continued weight loss with goal of 10% of body weight over the next 6 months using a low glycemic index diet and regular exercise a minimum of 5 days per week.        Hypothyroidism    Other Visit Diagnoses    Chronic venous insufficiency        Relevant Orders    For home use only DME Other see comment       I have discontinued Ms. Suitt's triamcinolone cream. I am also having her start on hepatitis A-hepatitis B. Additionally, I am having her maintain her Calcium Carb-Cholecalciferol (CALCIUM 1000 + D PO), Vitamin D, fish oil-omega-3 fatty acids, travoprost (benzalkonium), timolol, furosemide, levothyroxine, and amLODipine.  Meds ordered this encounter  Medications  . hepatitis  A-hepatitis B ([place]) 720-20 ELU-MCG/ML injection    Sig: INJECT ONCE , REPEAT IN ONE MONTH AND IN 6 MONTHS    Dispense:  0.5 mL    Refill:  2    Medications Discontinued During This Encounter  Medication Reason  . triamcinolone cream (KENALOG) 0.1 % Completed Course  A total of 25 minutes of face to face time was spent with patient more than half of which was spent in counselling about the above mentioned conditions  and coordination of care   Follow-up: Return in about 6 months (around 10/07/2015).   [name], MD

## 2015-04-08 NOTE — Assessment & Plan Note (Addendum)
Controlled with diet alone. I have addressed  BMI and recommended a low glycemic index diet utilizing smaller more frequent meals to increase metabolism.  I have also recommended that patient start exercising with a goal of 30 minutes of aerobic exercise a minimum of 5 days per week.  Lab Results  Component Value Date   HGBA1C 6.1 04/08/2015   Lab Results  Component Value Date   MICROALBUR 1.1 04/08/2015

## 2015-04-08 NOTE — Patient Instructions (Addendum)
You have lost 10 lbs this year!!  Keep up the good work!  Your BMI is still over 30 (the cutoff for obesity, as classified by the American Heart Association). So   I want to you lose 10 more lbs  Over the next 12 months using a low glycemic index diet and regular exercise (30 minutes of continuous walking, cycling, etc. 5 days per week)   Try the Heritage Flakes cereal available at [facility] on the bottom shelf, with vanilla flavored almond milk  [employer] sells an organic almond mik, non refrigerated,  Called :"orgain"  That is fortified with protein and very low carb . Look for it in the cereal aisle on the end closest to the meat.   You also need to get vaccinated against Hepatitis A and B. This is a series of 3 vaccines over 6 months and can be done here,  But if your insurance does not cover it, you will save $$$$ by getting it done at a pharmacy using the prescription  I have given   you today   .  The shots must be given at 0,  1 month and 6 months.    I recommend that you have  The TDaP vaccine and the Shingles vaccine.  I have given you prescriptions for these because they will be cheaper at the health Dept or at your local pharmacy because Medicare will not reimburse for them.  you should elevate legs when at home sitting whenever it is possible.  [facility] will measure patient and fit you for stockings, but they will only do it in the mornings because the measurements will be off by afternoon due to swelling.  You can also get them online through a mail order  Company called [employer],  the website is [email or web address] The DME order is a prescription  for 2 pairs of  the stockings.  You need to wear them daily  And put them on in the morning before you have been up and around,.  And take them off at night before bed.      [phone]

## 2015-04-10 NOTE — Assessment & Plan Note (Deleted)
Persistent without hepatic steatosis seen on ultrasound.  GI evaluation in progress, fatty liver suspected

## 2015-04-10 NOTE — Assessment & Plan Note (Signed)
Persistent without hepatic steatosis seen on ultrasound.  GI evaluation in progress, fatty liver suspected

## 2015-04-11 ENCOUNTER — Encounter: Payer: Self-pay | Admitting: *Deleted

## 2015-04-26 DIAGNOSIS — Z23 Encounter for immunization: Secondary | ICD-10-CM | POA: Diagnosis not present

## 2015-05-02 DIAGNOSIS — R740 Nonspecific elevation of levels of transaminase and lactic acid dehydrogenase [LDH]: Secondary | ICD-10-CM | POA: Diagnosis not present

## 2015-05-06 DIAGNOSIS — K7581 Nonalcoholic steatohepatitis (NASH): Secondary | ICD-10-CM | POA: Diagnosis not present

## 2015-05-06 LAB — HEPATIC FUNCTION PANEL
ALT: 41 U/L — AB (ref 7–35)
AST: 37 U/L — AB (ref 13–35)
Alkaline Phosphatase: 106 U/L (ref 25–125)
Bilirubin, Total: 0.4 mg/dL

## 2015-05-09 ENCOUNTER — Other Ambulatory Visit: Payer: Self-pay

## 2015-05-22 ENCOUNTER — Other Ambulatory Visit: Payer: Self-pay | Admitting: Internal Medicine

## 2015-05-26 DIAGNOSIS — H524 Presbyopia: Secondary | ICD-10-CM | POA: Diagnosis not present

## 2015-05-26 DIAGNOSIS — H35371 Puckering of macula, right eye: Secondary | ICD-10-CM | POA: Diagnosis not present

## 2015-05-26 DIAGNOSIS — H401131 Primary open-angle glaucoma, bilateral, mild stage: Secondary | ICD-10-CM | POA: Diagnosis not present

## 2015-08-08 ENCOUNTER — Other Ambulatory Visit: Payer: Self-pay

## 2015-08-08 MED ORDER — LEVOTHYROXINE SODIUM 25 MCG PO TABS: 25.0000 ug | ORAL_TABLET | Freq: Every day | ORAL | Status: DC

## 2015-08-08 MED ORDER — AMLODIPINE BESYLATE 10 MG PO TABS: 10.0000 mg | ORAL_TABLET | Freq: Every day | ORAL | Status: DC

## 2015-10-05 ENCOUNTER — Encounter: Payer: Self-pay | Admitting: Internal Medicine

## 2015-10-05 ENCOUNTER — Ambulatory Visit (INDEPENDENT_AMBULATORY_CARE_PROVIDER_SITE_OTHER): Payer: Medicare Other | Admitting: Internal Medicine

## 2015-10-05 VITALS — BP 110/60 | HR 70 | Temp 97.6°F | Resp 12 | Ht 62.0 in | Wt 176.8 lb

## 2015-10-05 DIAGNOSIS — E785 Hyperlipidemia, unspecified: Secondary | ICD-10-CM

## 2015-10-05 DIAGNOSIS — E669 Obesity, unspecified: Secondary | ICD-10-CM

## 2015-10-05 DIAGNOSIS — K760 Fatty (change of) liver, not elsewhere classified: Secondary | ICD-10-CM

## 2015-10-05 DIAGNOSIS — E038 Other specified hypothyroidism: Secondary | ICD-10-CM

## 2015-10-05 DIAGNOSIS — E034 Atrophy of thyroid (acquired): Secondary | ICD-10-CM

## 2015-10-05 DIAGNOSIS — E119 Type 2 diabetes mellitus without complications: Secondary | ICD-10-CM

## 2015-10-05 LAB — COMPREHENSIVE METABOLIC PANEL
ALBUMIN: 4.1 g/dL (ref 3.5–5.2)
ALK PHOS: 89 U/L (ref 39–117)
ALT: 32 U/L (ref 0–35)
AST: 35 U/L (ref 0–37)
BILIRUBIN TOTAL: 0.5 mg/dL (ref 0.2–1.2)
BUN: 13 mg/dL (ref 6–23)
CALCIUM: 9.8 mg/dL (ref 8.4–10.5)
CO2: 27 mEq/L (ref 19–32)
Chloride: 104 mEq/L (ref 96–112)
Creatinine, Ser: 0.84 mg/dL (ref 0.40–1.20)
GFR: 69.11 mL/min (ref >60.00)
Glucose, Bld: 115 mg/dL — ABNORMAL HIGH (ref 70–99)
POTASSIUM: 4.6 meq/L (ref 3.5–5.1)
Sodium: 139 mEq/L (ref 135–145)
TOTAL PROTEIN: 7.9 g/dL (ref 6.0–8.3)

## 2015-10-05 LAB — LIPID PANEL
CHOL/HDL RATIO: 4
CHOLESTEROL: 141 mg/dL (ref 0–200)
HDL: 34.5 mg/dL — AB (ref >39.00)
LDL Cholesterol: 79 mg/dL (ref 0–99)
NonHDL: 106.06
TRIGLYCERIDES: 137 mg/dL (ref 0.0–149.0)
VLDL: 27.4 mg/dL (ref 0.0–40.0)

## 2015-10-05 LAB — TSH: TSH: 1.51 u[IU]/mL (ref 0.35–4.50)

## 2015-10-05 LAB — HEMOGLOBIN A1C: Hgb A1c MFr Bld: 6.3 % (ref 4.6–6.5)

## 2015-10-05 NOTE — Progress Notes (Signed)
Cologuard ordered

## 2015-10-05 NOTE — Progress Notes (Signed)
Pre-visit discussion using our clinic review tool. No additional management support is needed unless otherwise documented below in the visit note.  

## 2015-10-05 NOTE — Progress Notes (Signed)
Subjective:  Patient ID: Lauren Morris, female    DOB: 1932-05-02  Age: 82 y.o. MRN: [id number]  CC: The primary encounter diagnosis was Hypothyroidism due to acquired atrophy of thyroid. Diagnoses of Hyperlipidemia, Fatty liver disease, nonalcoholic, Diabetes mellitus without complication ([place]), and Obesity were also pertinent to this visit.  HPI NOELA LASCALA presents for follow up on Type 2 DM.  Hyperlipidemia and hypothyroidism  Fatty liver.  In August she was referred to [name] for elevated liver enzymes and normal liver ultrasound. Serologies were repeated to rule out autoimune causes of hepatitis and the diagnosis of fatty liver was presumed confirmed.   He advised her to lose 10 lbs and told she wouldn't need another colonoscopy (!)  Patient has been illuminated  On her musunderstanding   She Has lost 6 lbs since her last visit intentionally .  Wants to consider cologuard as a option    Outpatient Prescriptions Prior to Visit  Medication Sig Dispense Refill  . amLODipine (NORVASC) 10 MG tablet Take 1 tablet (10 mg total) by mouth daily. 30 tablet 2  . Calcium Carb-Cholecalciferol (CALCIUM 1000 + D PO) Take 1 tablet by mouth daily.      . Cholecalciferol (VITAMIN D) 1000 UNITS capsule Take 1,000 Units by mouth daily.      . fish oil-omega-3 fatty acids 1000 MG capsule Take 2 g by mouth daily.      . furosemide (LASIX) 20 MG tablet TAKE 1 TABLET BY MOUTH EVERY DAY AS NEEDED FLUID RETENTION 30 tablet 0  . levothyroxine (SYNTHROID, LEVOTHROID) 25 MCG tablet Take 1 tablet (25 mcg total) by mouth daily. 30 tablet 8  . timolol (BETIMOL) 0.25 % ophthalmic solution Place 1-2 drops into both eyes 2 (two) times daily.    . travoprost, benzalkonium, (TRAVATAN) 0.004 % ophthalmic solution Place 1 drop into both eyes at bedtime.    . hepatitis A-hepatitis B ([place]) 720-20 ELU-MCG/ML injection INJECT ONCE , REPEAT IN ONE MONTH AND IN 6 MONTHS (Patient not taking: Reported on 10/05/2015) 0.5 mL  2   No facility-administered medications prior to visit.   Lab Results  Component Value Date   TSH 1.51 10/05/2015    Review of Systems;  Patient denies headache, fevers, malaise, unintentional weight loss, skin rash, eye pain, sinus congestion and sinus pain, sore throat, dysphagia,  hemoptysis , cough, dyspnea, wheezing, chest pain, palpitations, orthopnea, edema, abdominal pain, nausea, melena, diarrhea, constipation, flank pain, dysuria, hematuria, urinary  Frequency, nocturia, numbness, tingling, seizures,  Focal weakness, Loss of consciousness,  Tremor, insomnia, depression, anxiety, and suicidal ideation.      Objective:  BP 110/60 mmHg  Pulse 70  Temp(Src) 97.6 F (36.4 C) (Oral)  Resp 12  Ht 5\' 2"  (1.575 m)  Wt 176 lb 12 oz (80.173 kg)  BMI 32.32 kg/m2  SpO2 96%  BP Readings from Last 3 Encounters:  10/05/15 110/60  04/08/15 128/72  10/07/14 128/78    Wt Readings from Last 3 Encounters:  10/05/15 176 lb 12 oz (80.173 kg)  04/08/15 182 lb 6 oz (82.725 kg)  10/07/14 184 lb 4 oz (83.575 kg)    General appearance: alert, cooperative and appears stated age Ears: normal TM's and external ear canals both ears Throat: lips, mucosa, and tongue normal; teeth and gums normal Neck: no adenopathy, no carotid bruit, supple, symmetrical, trachea midline and thyroid not enlarged, symmetric, no tenderness/mass/nodules Back: symmetric, no curvature. ROM normal. No CVA tenderness. Lungs: clear to auscultation bilaterally Heart:  regular rate and rhythm, S1, S2 normal, no murmur, click, rub or gallop Abdomen: soft, non-tender; bowel sounds normal; no masses,  no organomegaly Pulses: 2+ and symmetric Skin: Skin color, texture, turgor normal. No rashes or lesions Lymph nodes: Cervical, supraclavicular, and axillary nodes normal.  Lab Results  Component Value Date   HGBA1C 6.3 10/05/2015   HGBA1C 6.1 04/08/2015   HGBA1C 6.5 10/07/2014    Lab Results  Component Value Date    CREATININE 0.84 10/05/2015   CREATININE 0.75 10/12/2014   CREATININE 0.83 10/07/2014    Lab Results  Component Value Date   WBC 7.8 10/29/2013   HGB 15.3* 10/29/2013   HCT 45.3 10/29/2013   PLT 251.0 10/29/2013   GLUCOSE 115* 10/05/2015   CHOL 141 10/05/2015   TRIG 137.0 10/05/2015   HDL 34.50* 10/05/2015   LDLDIRECT 84.0 04/08/2015   LDLCALC 79 10/05/2015   ALT 32 10/05/2015   AST 35 10/05/2015   NA 139 10/05/2015   K 4.6 10/05/2015   CL 104 10/05/2015   CREATININE 0.84 10/05/2015   BUN 13 10/05/2015   CO2 27 10/05/2015   TSH 1.51 10/05/2015   HGBA1C 6.3 10/05/2015   MICROALBUR 1.1 04/08/2015    US Abdomen Complete  12/29/2014  CLINICAL DATA:  Elevated liver enzymes EXAM: ULTRASOUND ABDOMEN COMPLETE COMPARISON:  None. FINDINGS: Gallbladder: No gallstones or wall thickening visualized. There is no pericholecystic fluid. No sonographic Murphy sign noted. Common bile duct: Diameter: 6 mm. There is no intrahepatic, common hepatic, or common bile duct dilatation. Liver: No focal lesion identified. Within normal limits in parenchymal echogenicity. IVC: No abnormality visualized. Pancreas: No mass or inflammatory focus. Spleen: Size and appearance within normal limits. Right Kidney: Length: 10.5 cm. Echogenicity within normal limits. No mass or hydronephrosis visualized. Left Kidney: Length: 11.2 cm. Echogenicity within normal limits. No mass or hydronephrosis visualized. Abdominal aorta: No aneurysm visualized. Other findings: No demonstrable ascites. IMPRESSION: Study within normal limits. Electronically Signed   By: Lowella Grip III M.D.   On: 12/29/2014 09:03    Assessment & Plan:   Problem List Items Addressed This Visit    Obesity    I have congratulated her in reduction of   BMI and encouraged  Continued weight loss with goal of 10% of body weigh over the next 6 months using a low glycemic index diet and regular exercise a minimum of 5 days per week.        Fatty liver  disease, nonalcoholic    Presumed , confirmed by GI.    Current liver enzymes are normal and all modifiable risk factors including obesity, diabetes and hyperlipidemia have been addressed       Relevant Orders   Comprehensive metabolic panel (Completed)   Diabetes mellitus without complication ([place])   Relevant Orders   Hemoglobin A1c (Completed)   Hypothyroidism - Primary   Relevant Orders   TSH (Completed)    Other Visit Diagnoses    Hyperlipidemia        Relevant Orders    Lipid panel (Completed)      A total of 25 minutes of face to face time was spent with patient more than half of which was spent in counselling about the above mentioned conditions  and coordination of care  I am having Ms. Huntley maintain her Calcium Carb-Cholecalciferol (CALCIUM 1000 + D PO), Vitamin D, fish oil-omega-3 fatty acids, travoprost (benzalkonium), timolol, furosemide, hepatitis A-hepatitis B, levothyroxine, and amLODipine.  No orders of the defined types  were placed in this encounter.    There are no discontinued medications.  Follow-up: Return in about 6 months (around 04/05/2016).   [name], MD

## 2015-10-05 NOTE — Patient Instructions (Addendum)
You are doing extremely well!!       To make a low carb chip :  Take the Joseph's Lavash or Pita bread,  Or the [place] Low carb whole wheat tortilla   Place on metal cookie sheet  Brush with olive oil  Sprinkle garlic powder (NOT garlic salt), grated parmesan cheese, mediterranean seasoning , or all of them?  Bake at 225 or 250 for 90 minutes   We have substitutions for your potatoes!!  Try the mashed cauliflower by [employer] (frozen section in a box)  and riced cauliflower dishes instead of rice and mashed potatoes  Mashed turnips are also very low carb!   For dessert:  Try the Dannon Lt n Fit greek yogurt dessert flavors and top with Reddi Whip .  8 carbs,  80 calories.  Try the  Key lime Strawberry Cheesecake Boston Cream    Try [employer]ik[employer] Triple Zero [place] Yogurt in the salted caramel, and the coffee flavors  With Whipped Cream for dessert

## 2015-10-08 NOTE — Assessment & Plan Note (Signed)
I have congratulated her in reduction of   BMI and encouraged  Continued weight loss with goal of 10% of body weigh over the next 6 months using a low glycemic index diet and regular exercise a minimum of 5 days per week.    

## 2015-10-08 NOTE — Assessment & Plan Note (Signed)
Presumed , confirmed by GI.    Current liver enzymes are normal and all modifiable risk factors including obesity, diabetes and hyperlipidemia have been addressed

## 2015-10-10 ENCOUNTER — Encounter: Payer: Self-pay | Admitting: *Deleted

## 2015-11-01 DIAGNOSIS — Z1212 Encounter for screening for malignant neoplasm of rectum: Secondary | ICD-10-CM | POA: Diagnosis not present

## 2015-11-01 DIAGNOSIS — Z1211 Encounter for screening for malignant neoplasm of colon: Secondary | ICD-10-CM | POA: Diagnosis not present

## 2015-11-01 LAB — COLOGUARD: COLOGUARD: NEGATIVE

## 2015-11-03 LAB — HM DIABETES EYE EXAM

## 2015-11-17 DIAGNOSIS — H401131 Primary open-angle glaucoma, bilateral, mild stage: Secondary | ICD-10-CM | POA: Diagnosis not present

## 2015-11-17 DIAGNOSIS — H5213 Myopia, bilateral: Secondary | ICD-10-CM | POA: Diagnosis not present

## 2015-11-17 DIAGNOSIS — H35371 Puckering of macula, right eye: Secondary | ICD-10-CM | POA: Diagnosis not present

## 2015-11-17 DIAGNOSIS — H524 Presbyopia: Secondary | ICD-10-CM | POA: Diagnosis not present

## 2015-11-17 DIAGNOSIS — H52223 Regular astigmatism, bilateral: Secondary | ICD-10-CM | POA: Diagnosis not present

## 2015-11-21 LAB — COLOGUARD: Cologuard: NEGATIVE

## 2015-11-22 ENCOUNTER — Telehealth: Payer: Self-pay | Admitting: Internal Medicine

## 2015-11-22 NOTE — Telephone Encounter (Signed)
Left message to call office

## 2015-11-22 NOTE — Telephone Encounter (Signed)
The results of patient's cologuard is negative.   We will repeat tin 3 years   For colon CA screening.

## 2015-11-23 NOTE — Telephone Encounter (Signed)
Patient notified and voiced understanding.

## 2015-11-23 NOTE — Telephone Encounter (Signed)
Pt called returning your call 

## 2015-11-29 ENCOUNTER — Encounter: Payer: Self-pay | Admitting: Internal Medicine

## 2016-02-23 ENCOUNTER — Other Ambulatory Visit: Payer: Self-pay | Admitting: Internal Medicine

## 2016-04-04 ENCOUNTER — Ambulatory Visit (INDEPENDENT_AMBULATORY_CARE_PROVIDER_SITE_OTHER): Payer: Medicare Other

## 2016-04-04 ENCOUNTER — Ambulatory Visit (INDEPENDENT_AMBULATORY_CARE_PROVIDER_SITE_OTHER): Payer: Medicare Other | Admitting: Internal Medicine

## 2016-04-04 ENCOUNTER — Encounter: Payer: Self-pay | Admitting: Internal Medicine

## 2016-04-04 VITALS — BP 128/70 | HR 65 | Temp 97.6°F | Resp 10 | Ht 62.0 in | Wt 183.2 lb

## 2016-04-04 DIAGNOSIS — R0989 Other specified symptoms and signs involving the circulatory and respiratory systems: Secondary | ICD-10-CM

## 2016-04-04 DIAGNOSIS — E785 Hyperlipidemia, unspecified: Secondary | ICD-10-CM

## 2016-04-04 DIAGNOSIS — E119 Type 2 diabetes mellitus without complications: Secondary | ICD-10-CM

## 2016-04-04 DIAGNOSIS — J9811 Atelectasis: Secondary | ICD-10-CM | POA: Diagnosis not present

## 2016-04-04 DIAGNOSIS — K760 Fatty (change of) liver, not elsewhere classified: Secondary | ICD-10-CM

## 2016-04-04 DIAGNOSIS — E039 Hypothyroidism, unspecified: Secondary | ICD-10-CM | POA: Diagnosis not present

## 2016-04-04 DIAGNOSIS — J181 Lobar pneumonia, unspecified organism: Secondary | ICD-10-CM

## 2016-04-04 DIAGNOSIS — I10 Essential (primary) hypertension: Secondary | ICD-10-CM

## 2016-04-04 DIAGNOSIS — J189 Pneumonia, unspecified organism: Secondary | ICD-10-CM

## 2016-04-04 LAB — CBC WITH DIFFERENTIAL/PLATELET
BASOS ABS: 0 10*3/uL (ref 0.0–0.1)
BASOS PCT: 0.3 % (ref 0.0–3.0)
EOS PCT: 7 % — AB (ref 0.0–5.0)
Eosinophils Absolute: 0.5 10*3/uL (ref 0.0–0.7)
HEMATOCRIT: 46.7 % — AB (ref 36.0–46.0)
Hemoglobin: 15.7 g/dL — ABNORMAL HIGH (ref 12.0–15.0)
LYMPHS PCT: 33.4 % (ref 12.0–46.0)
Lymphs Abs: 2.4 10*3/uL (ref 0.7–4.0)
MCHC: 33.5 g/dL (ref 30.0–36.0)
MCV: 85.8 fl (ref 78.0–100.0)
MONOS PCT: 8.1 % (ref 3.0–12.0)
Monocytes Absolute: 0.6 10*3/uL (ref 0.1–1.0)
NEUTROS ABS: 3.7 10*3/uL (ref 1.4–7.7)
Neutrophils Relative %: 51.2 % (ref 43.0–77.0)
PLATELETS: 252 10*3/uL (ref 150.0–400.0)
RBC: 5.45 Mil/uL — ABNORMAL HIGH (ref 3.87–5.11)
RDW: 13.7 % (ref 11.5–15.5)
WBC: 7.3 10*3/uL (ref 4.0–10.5)

## 2016-04-04 LAB — COMPREHENSIVE METABOLIC PANEL
ALBUMIN: 4 g/dL (ref 3.5–5.2)
ALT: 31 U/L (ref 0–35)
AST: 33 U/L (ref 0–37)
Alkaline Phosphatase: 89 U/L (ref 39–117)
BUN: 15 mg/dL (ref 6–23)
CALCIUM: 9.6 mg/dL (ref 8.4–10.5)
CHLORIDE: 105 meq/L (ref 96–112)
CO2: 26 mEq/L (ref 19–32)
CREATININE: 0.82 mg/dL (ref 0.40–1.20)
GFR: 70.97 mL/min (ref >60.00)
Glucose, Bld: 116 mg/dL — ABNORMAL HIGH (ref 70–99)
POTASSIUM: 4 meq/L (ref 3.5–5.1)
Sodium: 140 mEq/L (ref 135–145)
Total Bilirubin: 0.5 mg/dL (ref 0.2–1.2)
Total Protein: 8 g/dL (ref 6.0–8.3)

## 2016-04-04 LAB — LIPID PANEL
CHOLESTEROL: 137 mg/dL (ref 0–200)
HDL: 32.4 mg/dL — AB (ref >39.00)
LDL Cholesterol: 74 mg/dL (ref 0–99)
NonHDL: 104.19
Total CHOL/HDL Ratio: 4
Triglycerides: 151 mg/dL — ABNORMAL HIGH (ref 0.0–149.0)
VLDL: 30.2 mg/dL (ref 0.0–40.0)

## 2016-04-04 LAB — MICROALBUMIN / CREATININE URINE RATIO
Creatinine,U: 126.5 mg/dL
MICROALB UR: 1.2 mg/dL (ref 0.0–1.9)
Microalb Creat Ratio: 0.9 mg/g (ref 0.0–30.0)

## 2016-04-04 LAB — BRAIN NATRIURETIC PEPTIDE: Pro B Natriuretic peptide (BNP): 52 pg/mL (ref 0.0–100.0)

## 2016-04-04 LAB — HEMOGLOBIN A1C: Hgb A1c MFr Bld: 6.3 % (ref 4.6–6.5)

## 2016-04-04 LAB — TSH: TSH: 2.57 u[IU]/mL (ref 0.35–4.50)

## 2016-04-04 MED ORDER — AZITHROMYCIN 250 MG PO TABS: ORAL_TABLET | ORAL | 0 refills | Status: DC

## 2016-04-04 MED ORDER — FUROSEMIDE 20 MG PO TABS: ORAL_TABLET | ORAL | 3 refills | Status: DC

## 2016-04-04 NOTE — Progress Notes (Signed)
Pre-visit discussion using our clinic review tool. No additional management support is needed unless otherwise documented below in the visit note.  

## 2016-04-04 NOTE — Progress Notes (Signed)
Subjective:  Patient ID: Lauren Morris, female    DOB: 1931-07-17  Age: 82 y.o. MRN: [id number]  CC: The primary encounter diagnosis was Rales 1/4 way up posterior chest wall on left side. Diagnoses of Essential hypertension, Hypothyroidism, unspecified type, Hyperlipidemia LDL goal <100, Diabetes mellitus without complication ([place]), Fatty liver disease, nonalcoholic, and Pneumonia of left lower lobe due to infectious organism [facility]) were also pertinent to this visit.  HPI Lauren Morris presents for 6 month follow up on DM, htn, and hld. 3 month follow up on diabetes.    Patient is following a low glycemic index diet and taking all prescribed medications regularly without side effects.  Fasting sugars have been under less than 140 most of the time and post prandials have been under 160 except on rare occasions. Patient has had an eye exam in the last 12 months and checks feet regularly for signs of infection.  Patient does not walk barefoot outside,  And denies an numbness tingling or burning in feet. Patient is up to date on all recommended vaccinations.  Weight gain addressed . Not exercising due to the heat    no recent falls. Chronic nocturia 3 to 4 times per night.  Goes right back to sleep. Drinks a lot of water and tea.  Last meal is 5 pm , has a snack later on.  Limits liquid intake to before 7 pm.   Due for foot exam  Has glaucoma  Sees Dr Bing Plume every 4 to 6 months   Doing a lot of reading at night,  Goes to bed at 10-11 .    Exam crackle at left base  Some edema . Come cough reported  Over the last several weeks,  No orthopnea.   Outpatient Medications Prior to Visit  Medication Sig Dispense Refill  . amLODipine (NORVASC) 10 MG tablet TAKE ONE TABLET BY MOUTH EVERY DAY. 30 tablet 2  . Calcium Carb-Cholecalciferol (CALCIUM 1000 + D PO) Take 1 tablet by mouth daily.      . Cholecalciferol (VITAMIN D) 1000 UNITS capsule Take 1,000 Units by mouth daily.      . fish oil-omega-3 fatty acids  1000 MG capsule Take 2 g by mouth daily.      . hepatitis A-hepatitis B (TWINRIX) 720-20 ELU-MCG/ML injection INJECT ONCE , REPEAT IN ONE MONTH AND IN 6 MONTHS 0.5 mL 2  . levothyroxine (SYNTHROID, LEVOTHROID) 25 MCG tablet Take 1 tablet (25 mcg total) by mouth daily. 30 tablet 8  . timolol (BETIMOL) 0.25 % ophthalmic solution Place 1 drop into both eyes 2 (two) times daily.     . travoprost, benzalkonium, (TRAVATAN) 0.004 % ophthalmic solution Place 1 drop into both eyes at bedtime.    . furosemide (LASIX) 20 MG tablet TAKE 1 TABLET BY MOUTH EVERY DAY AS NEEDED FLUID RETENTION 30 tablet 0   No facility-administered medications prior to visit.     Review of Systems;  Patient denies headache, fevers, malaise, unintentional weight loss, skin rash, eye pain, sinus congestion and sinus pain, sore throat, dysphagia,  hemoptysis ,  dyspnea, wheezing, chest pain, palpitations, orthopnea, edema, abdominal pain, nausea, melena, diarrhea, constipation, flank pain, dysuria, hematuria, urinary  Frequency, nocturia, numbness, tingling, seizures,  Focal weakness, Loss of consciousness,  Tremor, insomnia, depression, anxiety, and suicidal ideation.      Objective:  BP 128/70   Pulse 65   Temp 97.6 F (36.4 C) (Oral)   Resp 10   Ht 5' 2"  (1.575  m)   Wt 183 lb 4 oz (83.1 kg)   SpO2 94%   BMI 33.52 kg/m   BP Readings from Last 3 Encounters:  04/04/16 128/70  10/05/15 110/60  04/08/15 128/72    Wt Readings from Last 3 Encounters:  04/04/16 183 lb 4 oz (83.1 kg)  10/05/15 176 lb 12 oz (80.2 kg)  04/08/15 182 lb 6 oz (82.7 kg)    General appearance: alert, cooperative and appears stated age Ears: normal TM's and external ear canals both ears Throat: lips, mucosa, and tongue normal; teeth and gums normal Neck: no adenopathy, no carotid bruit, supple, symmetrical, trachea midline and thyroid not enlarged, symmetric, no tenderness/mass/nodules Back: symmetric, no curvature. ROM normal. No CVA  tenderness. Lungs: clear to auscultation except for crackles at left base  Heart: regular rate and rhythm, S1, S2 normal, no murmur, click, rub or gallop Abdomen: soft, non-tender; bowel sounds normal; no masses,  no organomegaly Pulses: 2+ and symmetric Skin: Skin color, texture, turgor normal. No rashes or lesions Lymph nodes: Cervical, supraclavicular, and axillary nodes normal.  Lab Results  Component Value Date   HGBA1C 6.3 04/04/2016   HGBA1C 6.3 10/05/2015   HGBA1C 6.1 04/08/2015    Lab Results  Component Value Date   CREATININE 0.82 04/04/2016   CREATININE 0.84 10/05/2015   CREATININE 0.75 10/12/2014    Lab Results  Component Value Date   WBC 7.3 04/04/2016   HGB 15.7 (H) 04/04/2016   HCT 46.7 (H) 04/04/2016   PLT 252.0 04/04/2016   GLUCOSE 116 (H) 04/04/2016   CHOL 137 04/04/2016   TRIG 151.0 (H) 04/04/2016   HDL 32.40 (L) 04/04/2016   LDLDIRECT 84.0 04/08/2015   LDLCALC 74 04/04/2016   ALT 31 04/04/2016   AST 33 04/04/2016   NA 140 04/04/2016   K 4.0 04/04/2016   CL 105 04/04/2016   CREATININE 0.82 04/04/2016   BUN 15 04/04/2016   CO2 26 04/04/2016   TSH 2.57 04/04/2016   HGBA1C 6.3 04/04/2016   MICROALBUR 1.2 04/04/2016    US Abdomen Complete  Result Date: 12/29/2014 CLINICAL DATA:  Elevated liver enzymes EXAM: ULTRASOUND ABDOMEN COMPLETE COMPARISON:  None. FINDINGS: Gallbladder: No gallstones or wall thickening visualized. There is no pericholecystic fluid. No sonographic Murphy sign noted. Common bile duct: Diameter: 6 mm. There is no intrahepatic, common hepatic, or common bile duct dilatation. Liver: No focal lesion identified. Within normal limits in parenchymal echogenicity. IVC: No abnormality visualized. Pancreas: No mass or inflammatory focus. Spleen: Size and appearance within normal limits. Right Kidney: Length: 10.5 cm. Echogenicity within normal limits. No mass or hydronephrosis visualized. Left Kidney: Length: 11.2 cm. Echogenicity within  normal limits. No mass or hydronephrosis visualized. Abdominal aorta: No aneurysm visualized. Other findings: No demonstrable ascites. IMPRESSION: Study within normal limits. Electronically Signed   By: Lowella Grip III M.D.   On: 12/29/2014 09:03    Assessment & Plan:   Problem List Items Addressed This Visit    Hypothyroidism    Thyroid function is WNL on current dose.  No current changes needed.   Lab Results  Component Value Date   TSH 2.57 04/04/2016         Relevant Orders   TSH (Completed)   Hyperlipidemia LDL goal <100    LDL and triglycerides are at goal without medications.  No changes today.  She refuses statin therapy    Lab Results  Component Value Date   CHOL 137 04/04/2016   HDL 32.40 (L) 04/04/2016  [place] 74 04/04/2016   LDLDIRECT 84.0 04/08/2015   TRIG 151.0 (H) 04/04/2016   CHOLHDL 4 04/04/2016           Relevant Medications   furosemide (LASIX) 20 MG tablet   Other Relevant Orders   Lipid Profile (Completed)   Diabetes mellitus without complication ([place])    Controlled with diet alone. I have addressed  BMI and recommended a low glycemic index diet utilizing smaller more frequent meals to increase metabolism.  I have also recommended that patient start exercising with a goal of 30 minutes of aerobic exercise a minimum of 5 days per week.  Lab Results  Component Value Date   HGBA1C 6.3 04/04/2016   Lab Results  Component Value Date   MICROALBUR 1.2 04/04/2016             Relevant Orders   Urine Microalbumin w/creat. ratio (Completed)   HgB A1c (Completed)   Fatty liver disease, nonalcoholic    Presumed , confirmed by GI.    Current liver enzymes are normal and all modifiable risk factors including obesity, diabetes and hyperlipidemia have been addressed   Lab Results  Component Value Date   ALT 31 04/04/2016   AST 33 04/04/2016   ALKPHOS 89 04/04/2016   BILITOT 0.5 04/04/2016         Relevant Orders   CBC w/Diff  (Completed)   LLL pneumonia ([place])    Found on chest x ray done due to abnormal lung exam.  Has had some cough for the past 2 weeks.  Empiric antibiotic  treatment recommended.  Repeat chest x ray in 4 weeks.       Hypertension   Relevant Medications   furosemide (LASIX) 20 MG tablet   Other Relevant Orders   Comp Met (CMET) (Completed)   CBC w/Diff (Completed)    Other Visit Diagnoses    Rales 1/4 way up posterior chest wall on left side    -  Primary   Relevant Orders   DG Chest 2 View (Completed)   B Nat Peptide (Completed)      I am having Ms. Dykes maintain her Calcium Carb-Cholecalciferol (CALCIUM 1000 + D PO), Vitamin D, fish oil-omega-3 fatty acids, travoprost (benzalkonium), timolol, hepatitis A-hepatitis B, levothyroxine, amLODipine, and furosemide.  Meds ordered this encounter  Medications  . furosemide (LASIX) 20 MG tablet    Sig: TAKE 1 TABLET BY MOUTH EVERY DAY AS NEEDED FLUID RETENTION    Dispense:  30 tablet    Refill:  3    Medications Discontinued During This Encounter  Medication Reason  . furosemide (LASIX) 20 MG tablet Reorder    Follow-up: Return in about 6 months (around 10/03/2016) for follow up diabetes [place] .   [name], MD

## 2016-04-04 NOTE — Patient Instructions (Addendum)
You  can still use medications up to a year past the expiration date   I want you to start walking for 30 minutes 5 days per week.  Use the mall if you need to.  Practice standing on one leg and getting up from a chair without using your arms as I demonstrated today.  Your chest x ray will be reported to me some time today and we will call ou with the results

## 2016-04-05 ENCOUNTER — Telehealth: Payer: Self-pay | Admitting: Internal Medicine

## 2016-04-05 DIAGNOSIS — J181 Lobar pneumonia, unspecified organism: Secondary | ICD-10-CM

## 2016-04-05 DIAGNOSIS — J189 Pneumonia, unspecified organism: Secondary | ICD-10-CM

## 2016-04-05 HISTORY — DX: Pneumonia, unspecified organism: J18.9

## 2016-04-05 NOTE — Assessment & Plan Note (Signed)
Presumed , confirmed by GI.    Current liver enzymes are normal and all modifiable risk factors including obesity, diabetes and hyperlipidemia have been addressed   Lab Results  Component Value Date   ALT 31 04/04/2016   AST 33 04/04/2016   ALKPHOS 89 04/04/2016   BILITOT 0.5 04/04/2016

## 2016-04-05 NOTE — Assessment & Plan Note (Signed)
Found on chest x ray done due to abnormal lung exam.  Has had some cough for the past 2 weeks.  Empiric antibiotic  treatment recommended.  Repeat chest x ray in 4 weeks.

## 2016-04-05 NOTE — Telephone Encounter (Signed)
Chest x ray was abnormal,  Suggesting a pneumonia in left lower lobe.  sending azithromycin to her pharmacy to take for 5 days .  She will need a repeat chest x ray in 4 to 6 weeks to ensure clearing of infiltrate

## 2016-04-05 NOTE — Telephone Encounter (Signed)
Please call pt at [phone] this afternoon

## 2016-04-05 NOTE — Assessment & Plan Note (Signed)
Controlled with diet alone. I have addressed  BMI and recommended a low glycemic index diet utilizing smaller more frequent meals to increase metabolism.  I have also recommended that patient start exercising with a goal of 30 minutes of aerobic exercise a minimum of 5 days per week.  Lab Results  Component Value Date   HGBA1C 6.3 04/04/2016   Lab Results  Component Value Date   MICROALBUR 1.2 04/04/2016

## 2016-04-05 NOTE — Telephone Encounter (Signed)
Spoke with patient and gave results, thanks

## 2016-04-05 NOTE — Telephone Encounter (Signed)
Left a VM to return my call. 

## 2016-04-05 NOTE — Assessment & Plan Note (Signed)
Thyroid function is WNL on current dose.  No current changes needed.   Lab Results  Component Value Date   TSH 2.57 04/04/2016

## 2016-04-05 NOTE — Assessment & Plan Note (Signed)
LDL and triglycerides are at goal without medications.  No changes today.  She refuses statin therapy    Lab Results  Component Value Date   CHOL 137 04/04/2016   HDL 32.40 (L) 04/04/2016   LDLCALC 74 04/04/2016   LDLDIRECT 84.0 04/08/2015   TRIG 151.0 (H) 04/04/2016   CHOLHDL 4 04/04/2016

## 2016-04-25 ENCOUNTER — Telehealth: Payer: Self-pay | Admitting: Internal Medicine

## 2016-04-25 DIAGNOSIS — J189 Pneumonia, unspecified organism: Secondary | ICD-10-CM

## 2016-04-25 NOTE — Telephone Encounter (Signed)
Pt wants to know if she needs to come back in for a chest xray? Please advise?  Call pt @ [phone]. Thank you!

## 2016-04-25 NOTE — Telephone Encounter (Signed)
Left detailed message for patient advising she should come in for x ray anytime after next Wednesday order in epic.

## 2016-04-26 ENCOUNTER — Telehealth: Payer: Self-pay | Admitting: Internal Medicine

## 2016-04-26 NOTE — Telephone Encounter (Signed)
I called pt and left a vm to call office to sch AWV. Thank you! °

## 2016-05-02 DIAGNOSIS — Z1231 Encounter for screening mammogram for malignant neoplasm of breast: Secondary | ICD-10-CM | POA: Diagnosis not present

## 2016-05-02 LAB — HM MAMMOGRAPHY

## 2016-05-07 ENCOUNTER — Encounter: Payer: Self-pay | Admitting: Internal Medicine

## 2016-05-08 ENCOUNTER — Ambulatory Visit (INDEPENDENT_AMBULATORY_CARE_PROVIDER_SITE_OTHER): Payer: Medicare Other

## 2016-05-08 DIAGNOSIS — J189 Pneumonia, unspecified organism: Secondary | ICD-10-CM | POA: Diagnosis not present

## 2016-05-10 ENCOUNTER — Other Ambulatory Visit: Payer: Self-pay | Admitting: Internal Medicine

## 2016-05-10 ENCOUNTER — Telehealth: Payer: Self-pay | Admitting: *Deleted

## 2016-05-10 DIAGNOSIS — J181 Lobar pneumonia, unspecified organism: Secondary | ICD-10-CM

## 2016-05-10 DIAGNOSIS — J189 Pneumonia, unspecified organism: Secondary | ICD-10-CM

## 2016-05-10 DIAGNOSIS — R9389 Abnormal findings on diagnostic imaging of other specified body structures: Secondary | ICD-10-CM

## 2016-05-10 NOTE — Assessment & Plan Note (Signed)
Repeat chest x ray worse, with bilateral interstitial infiltrates..  CT chest wo contrast ordered.

## 2016-05-10 NOTE — Telephone Encounter (Signed)
Patient can be reached at [phone]

## 2016-05-10 NOTE — Telephone Encounter (Signed)
lvm for pt to return my call regarding her CT and follow up with Dr. Derrel Nip.

## 2016-05-18 ENCOUNTER — Ambulatory Visit: Payer: Medicare Other

## 2016-05-21 ENCOUNTER — Ambulatory Visit: Payer: Medicare Other | Admitting: Internal Medicine

## 2016-05-22 ENCOUNTER — Ambulatory Visit
Admission: RE | Admit: 2016-05-22 | Discharge: 2016-05-22 | Disposition: A | Discharge status: Home / Self Care | Payer: Medicare Other | Source: Ambulatory Visit | Attending: Internal Medicine | Admitting: Internal Medicine

## 2016-05-22 ENCOUNTER — Other Ambulatory Visit: Payer: Self-pay | Admitting: Internal Medicine

## 2016-05-22 DIAGNOSIS — R938 Abnormal findings on diagnostic imaging of other specified body structures: Secondary | ICD-10-CM | POA: Insufficient documentation

## 2016-05-22 DIAGNOSIS — R9389 Abnormal findings on diagnostic imaging of other specified body structures: Secondary | ICD-10-CM

## 2016-05-22 DIAGNOSIS — J9811 Atelectasis: Secondary | ICD-10-CM | POA: Diagnosis not present

## 2016-05-24 DIAGNOSIS — H35373 Puckering of macula, bilateral: Secondary | ICD-10-CM | POA: Diagnosis not present

## 2016-05-24 DIAGNOSIS — H401131 Primary open-angle glaucoma, bilateral, mild stage: Secondary | ICD-10-CM | POA: Diagnosis not present

## 2016-05-25 ENCOUNTER — Ambulatory Visit (INDEPENDENT_AMBULATORY_CARE_PROVIDER_SITE_OTHER): Payer: Medicare Other | Admitting: Internal Medicine

## 2016-05-25 ENCOUNTER — Encounter: Payer: Self-pay | Admitting: Internal Medicine

## 2016-05-25 DIAGNOSIS — J181 Lobar pneumonia, unspecified organism: Secondary | ICD-10-CM | POA: Diagnosis not present

## 2016-05-25 DIAGNOSIS — R938 Abnormal findings on diagnostic imaging of other specified body structures: Secondary | ICD-10-CM

## 2016-05-25 DIAGNOSIS — J189 Pneumonia, unspecified organism: Secondary | ICD-10-CM

## 2016-05-25 DIAGNOSIS — R9389 Abnormal findings on diagnostic imaging of other specified body structures: Secondary | ICD-10-CM | POA: Insufficient documentation

## 2016-05-25 DIAGNOSIS — Z23 Encounter for immunization: Secondary | ICD-10-CM | POA: Diagnosis not present

## 2016-05-25 NOTE — Progress Notes (Signed)
Subjective:  Patient ID: Lauren Morris, female    DOB: 01/14/1932  Age: 82 y.o. MRN: [id number]  CC: Diagnoses of Abnormal chest CT and Pneumonia of left lower lobe due to infectious organism [facility]) were pertinent to this visit.  HPI TERA EMGE presents for follow up on abnormal lung CT, done because of persistent rales  noted on last exam In October.   She report that she continues to have a  productive nonpurulent cough.  States that she sleeps well, does not cough all night long. Used to lead  a pretty active life, still does except for exercise.  Used to walk around the mall for exercise,  And was able to walk 3/4 mile before getting tired or short of breath.  Has not participated in this exercise in several months    CT scan reviewed with patient today.  Discussed the potential etiologies of the interstitial pattern that was characterized as " Unchanged since 2010 with mosaic pattern"  She worked  For  20 years in a [place] with no prophylactic lung screen.  No history of smoking, NO known history  of severe pneumonia or respiratory failure   Getting a flu shot elsewhere due to cost.    Outpatient Medications Prior to Visit  Medication Sig Dispense Refill  . amLODipine (NORVASC) 10 MG tablet TAKE ONE TABLET BY MOUTH EVERY DAY. 30 tablet 2  . azithromycin (ZITHROMAX) 250 MG tablet 2 tablets on day 1,  Then One tablet daily thereafter until gone 6 tablet 0  . Calcium Carb-Cholecalciferol (CALCIUM 1000 + D PO) Take 1 tablet by mouth daily.      . Cholecalciferol (VITAMIN D) 1000 UNITS capsule Take 1,000 Units by mouth daily.      . fish oil-omega-3 fatty acids 1000 MG capsule Take 2 g by mouth daily.      . furosemide (LASIX) 20 MG tablet TAKE 1 TABLET BY MOUTH EVERY DAY AS NEEDED FLUID RETENTION 30 tablet 3  . hepatitis A-hepatitis B (TWINRIX) 720-20 ELU-MCG/ML injection INJECT ONCE , REPEAT IN ONE MONTH AND IN 6 MONTHS 0.5 mL 2  . levothyroxine (SYNTHROID, LEVOTHROID) 25 MCG tablet  TAKE ONE TABLET BY MOUTH EVERY DAY 30 tablet 2  . timolol (BETIMOL) 0.25 % ophthalmic solution Place 1 drop into both eyes 2 (two) times daily.     . travoprost, benzalkonium, (TRAVATAN) 0.004 % ophthalmic solution Place 1 drop into both eyes at bedtime.     No facility-administered medications prior to visit.     Review of Systems;  Patient denies headache, fevers, malaise, unintentional weight loss, skin rash, eye pain, sinus congestion and sinus pain, sore throat, dysphagia,  hemoptysis , cough, dyspnea, wheezing, chest pain, palpitations, orthopnea, edema, abdominal pain, nausea, melena, diarrhea, constipation, flank pain, dysuria, hematuria, urinary  Frequency, nocturia, numbness, tingling, seizures,  Focal weakness, Loss of consciousness,  Tremor, insomnia, depression, anxiety, and suicidal ideation.      Objective:  BP (!) 146/68   Pulse 82   Temp 97.6 F (36.4 C) (Oral)   Resp 12   Ht 5\' 2"  (1.575 m)   Wt 190 lb (86.2 kg)   SpO2 93%   BMI 34.75 kg/m   BP Readings from Last 3 Encounters:  05/25/16 (!) 146/68  04/04/16 128/70  10/05/15 110/60    Wt Readings from Last 3 Encounters:  05/25/16 190 lb (86.2 kg)  04/04/16 183 lb 4 oz (83.1 kg)  10/05/15 176 lb 12 oz (80.2 kg)  General appearance: alert, cooperative and appears stated age Ears: normal TM's and external ear canals both ears Throat: lips, mucosa, and tongue normal; teeth and gums normal Neck: no adenopathy, no carotid bruit, supple, symmetrical, trachea midline and thyroid not enlarged, symmetric, no tenderness/mass/nodules Back: symmetric, no curvature. ROM normal. No CVA tenderness. Lungs: clear to auscultation bilaterally Heart: regular rate and rhythm, S1, S2 normal, no murmur, click, rub or gallop Abdomen: soft, non-tender; bowel sounds normal; no masses,  no organomegaly Pulses: 2+ and symmetric Skin: Skin color, texture, turgor normal. No rashes or lesions Lymph nodes: Cervical, supraclavicular,  and axillary nodes normal.  Lab Results  Component Value Date   HGBA1C 6.3 04/04/2016   HGBA1C 6.3 10/05/2015   HGBA1C 6.1 04/08/2015    Lab Results  Component Value Date   CREATININE 0.82 04/04/2016   CREATININE 0.84 10/05/2015   CREATININE 0.75 10/12/2014    Lab Results  Component Value Date   WBC 7.3 04/04/2016   HGB 15.7 (H) 04/04/2016   HCT 46.7 (H) 04/04/2016   PLT 252.0 04/04/2016   GLUCOSE 116 (H) 04/04/2016   CHOL 137 04/04/2016   TRIG 151.0 (H) 04/04/2016   HDL 32.40 (L) 04/04/2016   LDLDIRECT 84.0 04/08/2015   LDLCALC 74 04/04/2016   ALT 31 04/04/2016   AST 33 04/04/2016   NA 140 04/04/2016   K 4.0 04/04/2016   CL 105 04/04/2016   CREATININE 0.82 04/04/2016   BUN 15 04/04/2016   CO2 26 04/04/2016   TSH 2.57 04/04/2016   HGBA1C 6.3 04/04/2016   MICROALBUR 1.2 04/04/2016    Ct Chest Wo Contrast  Result Date: 05/22/2016 CLINICAL DATA:  Shortness of breath with activity and cough EXAM: CT CHEST WITHOUT CONTRAST TECHNIQUE: Multidetector CT imaging of the chest was performed following the standard protocol without IV contrast. COMPARISON:  05/08/2016, 07/23/2008 FINDINGS: Cardiovascular: Somewhat limited due the lack of IV contrast. Calcific changes are noted within the thoracic aorta without aneurysmal dilatation. Mild coronary calcifications are seen. Cardiac structures are not significantly enlarged. Mediastinum/Nodes: No significant hilar or mediastinal adenopathy is noted. The thoracic inlet is within normal limits. No axillary adenopathy is seen. Lungs/Pleura: Some patchy mosaic attenuation is noted throughout both lungs but relatively stable from 2010. No focal confluent infiltrate is seen although some mild atelectatic changes are noted in the left lower lobe anteriorly as well as the right middle lobe. No sizable effusion is seen. No parenchymal nodules are noted. Upper Abdomen: No acute abnormality. Musculoskeletal: Degenerative changes of the thoracic spine  are noted. No acute bony abnormality is seen. IMPRESSION: Bibasilar atelectatic changes as described. No focal confluent infiltrate is seen. Chronic patchy mosaic attenuation throughout both lungs. Stable from the previous exam Electronically Signed   By: Inez Catalina M.D.   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 She has declined for now because she feels she has no symptoms.  She was encouraged to resume her daily walking program and if she is unable to complete the previous distance of 3/4 miles,  Will reconsider pulmonary evaluation       LLL pneumonia ([place])    Clinically resolved, now resolved with radiograph       A  total of 25 minutes was spent with patient more than half of which was spent in counseling patient on the above mentioned issue , reviewing and explaining recent imaging studies done, and coordination of care.  I am having Ms. Bardwell maintain her Calcium Carb-Cholecalciferol (CALCIUM 1000 + D PO), Vitamin D, fish oil-omega-3 fatty acids, travoprost (benzalkonium), timolol, hepatitis A-hepatitis B, furosemide, azithromycin, levothyroxine, and amLODipine.  No orders of the defined types were placed in this encounter.   There are no discontinued medications.  Follow-up: No Follow-up on file.   [name], MD

## 2016-05-25 NOTE — Patient Instructions (Addendum)
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

## 2016-05-25 NOTE — Progress Notes (Signed)
Pre-visit discussion using our clinic review tool. No additional management support is needed unless otherwise documented below in the visit note.  

## 2016-05-27 NOTE — Assessment & Plan Note (Signed)
Clinically resolved, now resolved with radiograph

## 2016-05-27 NOTE — Assessment & Plan Note (Signed)
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 She has declined for now because she feels she has no symptoms.  She was encouraged to resume her daily walking program and if she is unable to complete the previous distance of 3/4 miles,  Will reconsider pulmonary evaluation

## 2016-06-04 ENCOUNTER — Ambulatory Visit: Payer: Medicare Other | Admitting: Internal Medicine

## 2016-07-27 ENCOUNTER — Telehealth: Payer: Self-pay | Admitting: Internal Medicine

## 2016-07-27 NOTE — Telephone Encounter (Signed)
I called pt to sch AWV. Thank you!

## 2016-07-31 ENCOUNTER — Ambulatory Visit (INDEPENDENT_AMBULATORY_CARE_PROVIDER_SITE_OTHER): Payer: Medicare Other

## 2016-07-31 ENCOUNTER — Ambulatory Visit (INDEPENDENT_AMBULATORY_CARE_PROVIDER_SITE_OTHER): Payer: Medicare Other | Admitting: Family

## 2016-07-31 ENCOUNTER — Encounter: Payer: Self-pay | Admitting: Family

## 2016-07-31 VITALS — BP 134/64 | HR 81 | Temp 98.0°F | Ht 62.0 in | Wt 190.2 lb

## 2016-07-31 DIAGNOSIS — J40 Bronchitis, not specified as acute or chronic: Secondary | ICD-10-CM | POA: Diagnosis not present

## 2016-07-31 DIAGNOSIS — R05 Cough: Secondary | ICD-10-CM | POA: Diagnosis not present

## 2016-07-31 MED ORDER — BENZONATATE 100 MG PO CAPS: 100.0000 mg | ORAL_CAPSULE | Freq: Two times a day (BID) | ORAL | 0 refills | Status: DC | PRN

## 2016-07-31 NOTE — Progress Notes (Signed)
Subjective:    Patient ID: Lauren Morris, female    DOB: 01/03/1932, 83 y.o.   MRN: [id number]  CC: Lauren Morris is a 83 y.o. female who presents today for an acute visit.    HPI: Chief complaint  cough x days, worsening. Endorses sinus pressure, clear runny nasal discharge; one                   episode of chills and tactile warmth last night, resolved. No ear pain, sore throat, wheezing, sob, CP, palpitations. Hasn't tried any medications.   2 months ago treated for pneumonia.  No lung disease    HISTORY:  Past Medical History:  Diagnosis Date  . Cyst    hx o on right breast  . DJD (degenerative joint disease)    right knee  . Glaucoma   . Hypertension   . Thyroid disease    Past Surgical History:  Procedure Laterality Date  . ABDOMINAL HYSTERECTOMY    . VITRECTOMY  July 2012   right eye with membrane peel [name], [place])   Family History  Problem Relation Age of Onset  . Cancer Father     Allergies: Patient has no known allergies. Current Outpatient Prescriptions on File Prior to Visit  Medication Sig Dispense Refill  . amLODipine (NORVASC) 10 MG tablet TAKE ONE TABLET BY MOUTH EVERY DAY. 30 tablet 2  . azithromycin (ZITHROMAX) 250 MG tablet 2 tablets on day 1,  Then One tablet daily thereafter until gone 6 tablet 0  . Calcium Carb-Cholecalciferol (CALCIUM 1000 + D PO) Take 1 tablet by mouth daily.      . Cholecalciferol (VITAMIN D) 1000 UNITS capsule Take 1,000 Units by mouth daily.      . fish oil-omega-3 fatty acids 1000 MG capsule Take 2 g by mouth daily.      . furosemide (LASIX) 20 MG tablet TAKE 1 TABLET BY MOUTH EVERY DAY AS NEEDED FLUID RETENTION 30 tablet 3  . hepatitis A-hepatitis B (TWINRIX) 720-20 ELU-MCG/ML injection INJECT ONCE , REPEAT IN ONE MONTH AND IN 6 MONTHS 0.5 mL 2  . levothyroxine (SYNTHROID, LEVOTHROID) 25 MCG tablet TAKE ONE TABLET BY MOUTH EVERY DAY 30 tablet 2  . timolol (BETIMOL) 0.25 % ophthalmic solution Place 1 drop into both eyes 2  (two) times daily.     . travoprost, benzalkonium, (TRAVATAN) 0.004 % ophthalmic solution Place 1 drop into both eyes at bedtime.     No current facility-administered medications on file prior to visit.     Social History  Substance Use Topics  . Smoking status: Never Smoker  . Smokeless tobacco: Never Used  . Alcohol use No    Review of Systems  Constitutional: Negative for chills and fever.  HENT: Positive for congestion and sinus pressure. Negative for ear pain, sinus pain and sore throat.   Respiratory: Positive for cough. Negative for shortness of breath and wheezing.   Cardiovascular: Negative for chest pain and palpitations.  Gastrointestinal: Negative for nausea and vomiting.      Objective:    BP 134/64   Pulse 81   Temp 98 F (36.7 C) (Oral)   Ht 5\' 2"  (1.575 m)   Wt 190 lb 3.2 oz (86.3 kg)   SpO2 94%   BMI 34.79 kg/m    Physical Exam  Constitutional: She appears well-developed and well-nourished.  HENT:  Head: Normocephalic and atraumatic.  Right Ear: Hearing, tympanic membrane, external ear and ear canal normal. No drainage,  swelling or tenderness. No foreign bodies. Tympanic membrane is not erythematous and not bulging. No middle ear effusion. No decreased hearing is noted.  Left Ear: Hearing, tympanic membrane, external ear and ear canal normal. No drainage, swelling or tenderness. No foreign bodies. Tympanic membrane is not erythematous and not bulging.  No middle ear effusion. No decreased hearing is noted.  Nose: Rhinorrhea present. Right sinus exhibits no maxillary sinus tenderness and no frontal sinus tenderness. Left sinus exhibits no maxillary sinus tenderness and no frontal sinus tenderness.  Mouth/Throat: Uvula is midline, oropharynx is clear and moist and mucous membranes are normal. No oropharyngeal exudate, posterior oropharyngeal edema, posterior oropharyngeal erythema or tonsillar abscesses.  Eyes: Conjunctivae are normal.  Cardiovascular:  Regular rhythm, normal heart sounds and normal pulses.   Pulmonary/Chest: Effort normal and breath sounds normal. She has no wheezes. She has no rhonchi. She has no rales.  Lymphadenopathy:       Head (right side): No submental, no submandibular, no tonsillar, no preauricular, no posterior auricular and no occipital adenopathy present.       Head (left side): No submental, no submandibular, no tonsillar, no preauricular, no posterior auricular and no occipital adenopathy present.    She has no cervical adenopathy.  Neurological: She is alert.  Skin: Skin is warm and dry.  Psychiatric: She has a normal mood and affect. Her speech is normal and behavior is normal. Thought content normal.  Vitals reviewed.      Assessment & Plan:   1. Bronchitis Afebrile. SaO2 94%. Patient in no respiratory distress. Due to her recent diagnosis of pneumonia, pending chest x-ray to ensure no underlying bacterial infection. Otherwise, patient and I discussed conservative measures including Claritin, nasal saline spray, honey for a couple of days to see if improvement. She also use Tessalon Perles when necessary. Return precautions given  - DG Chest 2 View    I am having Ms. Mannes start on benzonatate. I am also having her maintain her Calcium Carb-Cholecalciferol (CALCIUM 1000 + D PO), Vitamin D, fish oil-omega-3 fatty acids, travoprost (benzalkonium), timolol, hepatitis A-hepatitis B, furosemide, azithromycin, levothyroxine, and amLODipine.   Meds ordered this encounter  Medications  . benzonatate (TESSALON) 100 MG capsule    Sig: Take 1 capsule (100 mg total) by mouth 2 (two) times daily as needed for cough.    Dispense:  20 capsule    Refill:  0    Order Specific Question:   Supervising Provider    Answer:   [name] [[id number]]    Return precautions given.   Risks, benefits, and alternatives of the medications and treatment plan prescribed today were discussed, and patient expressed  understanding.   Education regarding symptom management and diagnosis given to patient on AVS.  Continue to follow with TULLO, Aris Everts, MD for routine health maintenance.   Carollee Sires and I agreed with plan.   Mable Paris, FNP

## 2016-07-31 NOTE — Progress Notes (Signed)
Pre visit review using our clinic review tool, if applicable. No additional management support is needed unless otherwise documented below in the visit note. 

## 2016-07-31 NOTE — Patient Instructions (Addendum)
cxr   As long as cxr doesn't show pneumonia, I would like you to take over the counter  claritin once daily.  Also buy nasal saline spray  Increase intake of clear fluids. Congestion is best treated by hydration, when mucus is wetter, it is thinner, less sticky, and easier to expel from the body, either through coughing up drainage, or by blowing your nose.   Get plenty of rest.   Use saline nasal drops and blow your nose frequently. Run a humidifier at night and elevate the head of the bed. Vicks Vapor rub will help with congestion and cough. Steam showers and sinus massage for congestion.   Use Acetaminophen or Ibuprofen as needed for fever or pain. Avoid second hand smoke. Even the smallest exposure will worsen symptoms.   Over the counter medications you can try include Delsym for cough, a decongestant for congestion, and Mucinex or Robitussin as an expectorant. Be sure to just get the plain Mucinex or Robitussin that just has one medication (Guaifenesen). We don't recommend the combination products. Note, be sure to drink two glasses of water with each dose of Mucinex as the medication will not work well without adequate hydration.   You can also try a teaspoon of honey to see if this will help reduce cough. Throat lozenges can sometimes be beneficial as well.    This illness will typically last 7 - 10 days.   Please follow up with our clinic if you develop a fever greater than 101 F, symptoms worsen, or do not resolve in the next week.

## 2016-08-01 ENCOUNTER — Telehealth: Payer: Self-pay | Admitting: Internal Medicine

## 2016-08-01 NOTE — Telephone Encounter (Signed)
Lauren Morris spoke with patient and informed patient of xray results see documentation imaging dated 07/31/16.

## 2016-08-01 NOTE — Telephone Encounter (Signed)
Pt daughter called to get xray results from yesterday to detect pneumonia. Please advise?  Call pt daughter@ [phone]. Thank you!

## 2016-08-13 ENCOUNTER — Telehealth: Payer: Self-pay | Admitting: Internal Medicine

## 2016-08-13 NOTE — Telephone Encounter (Signed)
I called pt and left a vm to call the office to sch AWV. Thank you! °

## 2016-08-21 ENCOUNTER — Other Ambulatory Visit: Payer: Self-pay | Admitting: Internal Medicine

## 2016-10-04 ENCOUNTER — Ambulatory Visit (INDEPENDENT_AMBULATORY_CARE_PROVIDER_SITE_OTHER): Payer: Medicare Other | Admitting: Internal Medicine

## 2016-10-04 ENCOUNTER — Encounter: Payer: Self-pay | Admitting: Internal Medicine

## 2016-10-04 VITALS — BP 124/70 | HR 65 | Temp 97.6°F | Resp 17 | Ht 62.0 in | Wt 186.0 lb

## 2016-10-04 DIAGNOSIS — E6609 Other obesity due to excess calories: Secondary | ICD-10-CM | POA: Diagnosis not present

## 2016-10-04 DIAGNOSIS — K760 Fatty (change of) liver, not elsewhere classified: Secondary | ICD-10-CM | POA: Diagnosis not present

## 2016-10-04 DIAGNOSIS — I10 Essential (primary) hypertension: Secondary | ICD-10-CM | POA: Diagnosis not present

## 2016-10-04 DIAGNOSIS — E2839 Other primary ovarian failure: Secondary | ICD-10-CM

## 2016-10-04 DIAGNOSIS — E66811 Obesity, class 1: Secondary | ICD-10-CM

## 2016-10-04 DIAGNOSIS — E039 Hypothyroidism, unspecified: Secondary | ICD-10-CM

## 2016-10-04 DIAGNOSIS — Z6832 Body mass index (BMI) 32.0-32.9, adult: Secondary | ICD-10-CM | POA: Diagnosis not present

## 2016-10-04 DIAGNOSIS — E785 Hyperlipidemia, unspecified: Secondary | ICD-10-CM

## 2016-10-04 DIAGNOSIS — E119 Type 2 diabetes mellitus without complications: Secondary | ICD-10-CM | POA: Diagnosis not present

## 2016-10-04 LAB — COMPREHENSIVE METABOLIC PANEL
ALK PHOS: 101 U/L (ref 39–117)
ALT: 40 U/L — AB (ref 0–35)
AST: 39 U/L — ABNORMAL HIGH (ref 0–37)
Albumin: 4.2 g/dL (ref 3.5–5.2)
BILIRUBIN TOTAL: 0.5 mg/dL (ref 0.2–1.2)
BUN: 13 mg/dL (ref 6–23)
CALCIUM: 9.6 mg/dL (ref 8.4–10.5)
CO2: 26 meq/L (ref 19–32)
Chloride: 105 mEq/L (ref 96–112)
Creatinine, Ser: 0.76 mg/dL (ref 0.40–1.20)
GFR: 77.38 mL/min (ref >60.00)
Glucose, Bld: 127 mg/dL — ABNORMAL HIGH (ref 70–99)
Potassium: 4.5 mEq/L (ref 3.5–5.1)
Sodium: 141 mEq/L (ref 135–145)
TOTAL PROTEIN: 7.9 g/dL (ref 6.0–8.3)

## 2016-10-04 LAB — LIPID PANEL
CHOLESTEROL: 147 mg/dL (ref 0–200)
HDL: 33.8 mg/dL — ABNORMAL LOW (ref >39.00)
LDL Cholesterol: 80 mg/dL (ref 0–99)
NonHDL: 113.01
TRIGLYCERIDES: 166 mg/dL — AB (ref 0.0–149.0)
Total CHOL/HDL Ratio: 4
VLDL: 33.2 mg/dL (ref 0.0–40.0)

## 2016-10-04 LAB — POCT GLYCOSYLATED HEMOGLOBIN (HGB A1C): Hemoglobin A1C: 6.3

## 2016-10-04 LAB — TSH: TSH: 2.51 u[IU]/mL (ref 0.35–4.50)

## 2016-10-04 NOTE — Progress Notes (Signed)
Subjective:  Patient ID: Lauren Morris, female    DOB: July 18, 1931  Age: 83 y.o. MRN: [id number]  CC: The primary encounter diagnosis was Acquired hypothyroidism. Diagnoses of Estrogen deficiency, Diabetes mellitus without complication ([place]), Essential hypertension, Hyperlipidemia LDL goal <100, Fatty liver disease, nonalcoholic, and Class 1 obesity due to excess calories with body mass index (BMI) of 32.0 to 32.9 in adult, unspecified whether serious comorbidity present were also pertinent to this visit.  HPI DANAE OLAND presents for follow up on chronic conditions including type 2 dm   Treated for bronchitis feb 2  chext x ray revealed no sign of pneumonia. .  Cough lingered for several weeks.  But has finally resolved and her energy level is back to baseline.  Has interstital fibrosis suggested on prior lung CT.     Having some issue with pollen . Not taking anything daily bc doesn't go out side .    Has lost 4 lbs .  Needs to lose 10 lb mors.  Misses sweets.   Does not exercise  but is active with grandchildren.       Outpatient Medications Prior to Visit  Medication Sig Dispense Refill  . amLODipine (NORVASC) 10 MG tablet TAKE ONE TABLET BY MOUTH EVERY DAY 30 tablet 5  . azithromycin (ZITHROMAX) 250 MG tablet 2 tablets on day 1,  Then One tablet daily thereafter until gone 6 tablet 0  . benzonatate (TESSALON) 100 MG capsule Take 1 capsule (100 mg total) by mouth 2 (two) times daily as needed for cough. 20 capsule 0  . Calcium Carb-Cholecalciferol (CALCIUM 1000 + D PO) Take 1 tablet by mouth daily.      . Cholecalciferol (VITAMIN D) 1000 UNITS capsule Take 1,000 Units by mouth daily.      . fish oil-omega-3 fatty acids 1000 MG capsule Take 2 g by mouth daily.      . furosemide (LASIX) 20 MG tablet TAKE 1 TABLET BY MOUTH EVERY DAY AS NEEDED FLUID RETENTION 30 tablet 3  . hepatitis A-hepatitis B (TWINRIX) 720-20 ELU-MCG/ML injection INJECT ONCE , REPEAT IN ONE MONTH AND IN 6 MONTHS 0.5  mL 2  . levothyroxine (SYNTHROID, LEVOTHROID) 25 MCG tablet TAKE ONE TABLET BY MOUTH EVERY DAY 30 tablet 8  . timolol (BETIMOL) 0.25 % ophthalmic solution Place 1 drop into both eyes 2 (two) times daily.     . travoprost, benzalkonium, (TRAVATAN) 0.004 % ophthalmic solution Place 1 drop into both eyes at bedtime.     No facility-administered medications prior to visit.     Review of Systems;  Patient denies headache, fevers, malaise, unintentional weight loss, skin rash, eye pain, sinus congestion and sinus pain, sore throat, dysphagia,  hemoptysis , cough, dyspnea, wheezing, chest pain, palpitations, orthopnea, edema, abdominal pain, nausea, melena, diarrhea, constipation, flank pain, dysuria, hematuria, urinary  Frequency, nocturia, numbness, tingling, seizures,  Focal weakness, Loss of consciousness,  Tremor, insomnia, depression, anxiety, and suicidal ideation.      Objective:  BP 124/70   Pulse 65   Temp 97.6 F (36.4 C) (Oral)   Resp 17   Ht 5\' 2"  (1.575 m)   Wt 186 lb (84.4 kg)   SpO2 96%   BMI 34.02 kg/m   BP Readings from Last 3 Encounters:  10/04/16 124/70  07/31/16 134/64  05/25/16 (!) 146/68    Wt Readings from Last 3 Encounters:  10/04/16 186 lb (84.4 kg)  07/31/16 190 lb 3.2 oz (86.3 kg)  05/25/16 190 lb (  86.2 kg)    General appearance: alert, cooperative and appears stated age Ears: normal TM's and external ear canals both ears Throat: lips, mucosa, and tongue normal; teeth and gums normal Neck: no adenopathy, no carotid bruit, supple, symmetrical, trachea midline and thyroid not enlarged, symmetric, no tenderness/mass/nodules Back: symmetric, no curvature. ROM normal. No CVA tenderness. Lungs: clear to auscultation bilaterally Heart: regular rate and rhythm, S1, S2 normal, no murmur, click, rub or gallop Abdomen: soft, non-tender; bowel sounds normal; no masses,  no organomegaly Pulses: 2+ and symmetric Skin: Skin color, texture, turgor normal. No rashes  or lesions Lymph nodes: Cervical, supraclavicular, and axillary nodes normal.  Lab Results  Component Value Date   HGBA1C 6.3 10/04/2016   HGBA1C 6.3 04/04/2016   HGBA1C 6.3 10/05/2015    Lab Results  Component Value Date   CREATININE 0.76 10/04/2016   CREATININE 0.82 04/04/2016   CREATININE 0.84 10/05/2015    Lab Results  Component Value Date   WBC 7.3 04/04/2016   HGB 15.7 (H) 04/04/2016   HCT 46.7 (H) 04/04/2016   PLT 252.0 04/04/2016   GLUCOSE 127 (H) 10/04/2016   CHOL 147 10/04/2016   TRIG 166.0 (H) 10/04/2016   HDL 33.80 (L) 10/04/2016   LDLDIRECT 84.0 04/08/2015   LDLCALC 80 10/04/2016   ALT 40 (H) 10/04/2016   AST 39 (H) 10/04/2016   NA 141 10/04/2016   K 4.5 10/04/2016   CL 105 10/04/2016   CREATININE 0.76 10/04/2016   BUN 13 10/04/2016   CO2 26 10/04/2016   TSH 2.51 10/04/2016   HGBA1C 6.3 10/04/2016   MICROALBUR 1.2 04/04/2016    Ct Chest Wo Contrast  Result Date: 05/22/2016 CLINICAL DATA:  Shortness of breath with activity and cough EXAM: CT CHEST WITHOUT CONTRAST TECHNIQUE: Multidetector CT imaging of the chest was performed following the standard protocol without IV contrast. COMPARISON:  05/08/2016, 07/23/2008 FINDINGS: Cardiovascular: Somewhat limited due the lack of IV contrast. Calcific changes are noted within the thoracic aorta without aneurysmal dilatation. Mild coronary calcifications are seen. Cardiac structures are not significantly enlarged. Mediastinum/Nodes: No significant hilar or mediastinal adenopathy is noted. The thoracic inlet is within normal limits. No axillary adenopathy is seen. Lungs/Pleura: Some patchy mosaic attenuation is noted throughout both lungs but relatively stable from 2010. No focal confluent infiltrate is seen although some mild atelectatic changes are noted in the left lower lobe anteriorly as well as the right middle lobe. No sizable effusion is seen. No parenchymal nodules are noted. Upper Abdomen: No acute abnormality.  Musculoskeletal: Degenerative changes of the thoracic spine are noted. No acute bony abnormality is seen. IMPRESSION: Bibasilar atelectatic changes as described. No focal confluent infiltrate is seen. Chronic patchy mosaic attenuation throughout both lungs. Stable from the previous exam Electronically Signed   By: Inez Catalina M.D.   On: 05/22/2016 11:17    Assessment & Plan:   Problem List Items Addressed This Visit    Acquired hypothyroidism - Primary    Thyroid function is WNL on current dose.  No current changes needed.   Lab Results  Component Value Date   TSH 2.51 10/04/2016         Relevant Orders   TSH (Completed)   Diabetes mellitus without complication ([place])    Controlled with diet alone. I have addressed  BMI and recommended a low glycemic index diet utilizing smaller more frequent meals to increase metabolism.  I have also recommended that patient start exercising with a goal of 30 minutes of  aerobic exercise a minimum of 5 days per week.  Lab Results  Component Value Date   HGBA1C 6.3 10/04/2016   Lab Results  Component Value Date   MICROALBUR 1.2 04/04/2016             Relevant Orders   POCT HgB A1C (Completed)   Essential hypertension    Well controlled on current regimen. Renal function stable, no changes today.  Lab Results  Component Value Date   CREATININE 0.76 10/04/2016   Lab Results  Component Value Date   NA 141 10/04/2016   K 4.5 10/04/2016   CL 105 10/04/2016   CO2 26 10/04/2016         Relevant Orders   Comprehensive metabolic panel (Completed)   Fatty liver disease, nonalcoholic    Presumed , confirmed by GI.    Current liver enzymes are mildly elevated and all modifiable risk factors including obesity, diabetes and hyperlipidemia have been addressed   Lab Results  Component Value Date   ALT 40 (H) 10/04/2016   AST 39 (H) 10/04/2016   ALKPHOS 101 10/04/2016   BILITOT 0.5 10/04/2016         Hyperlipidemia LDL goal <100     LDL and triglycerides are at goal without medications.  No changes today.  She refuses statin therapy    Lab Results  Component Value Date   CHOL 147 10/04/2016   HDL 33.80 (L) 10/04/2016   LDLCALC 80 10/04/2016   LDLDIRECT 84.0 04/08/2015   TRIG 166.0 (H) 10/04/2016   CHOLHDL 4 10/04/2016           Relevant Orders   Lipid panel (Completed)   Obesity    I have congratulated her in reduction of  Weight and encouraged  Continued weight loss with goal of 10% of body weigh over the next 6 months using a low glycemic index diet and regular exercise a minimum of 5 days per week.         Other Visit Diagnoses    Estrogen deficiency       Relevant Orders   DG Bone Density    A total of 25 minutes of face to face time was spent with patient more than half of which was spent in counselling and coordination of care   I am having Ms. Bradly maintain her Calcium Carb-Cholecalciferol (CALCIUM 1000 + D PO), Vitamin D, fish oil-omega-3 fatty acids, travoprost (benzalkonium), timolol, hepatitis A-hepatitis B, furosemide, azithromycin, benzonatate, levothyroxine, and amLODipine.  No orders of the defined types were placed in this encounter.   There are no discontinued medications.  Follow-up: Return in about 3 months (around 01/03/2017) for follow up diabetes.   [name], MD

## 2016-10-04 NOTE — Patient Instructions (Addendum)
Your diabetes remains under excellent control !   And I want you to lose ten more lbs this year through dietary restraint  You are permitted to indulge in sweets once a week!  Go for it?   Avoid  Fruit that's in syrup,  Use the frozen and let them thaw  Top with whipped cream   For a high  protein snack that's low in sugar,  Try:  Power Crunsize  KIND Bars 3g sugar (mini's) or 5g sugar (full size)  [employer] Carb Smart Fudgsicles and ice cream bars are now at [employer]

## 2016-10-04 NOTE — Progress Notes (Signed)
Pre visit review using our clinic review tool, if applicable. No additional management support is needed unless otherwise documented below in the visit note. 

## 2016-10-07 LAB — HM DIABETES EYE EXAM

## 2016-10-07 NOTE — Assessment & Plan Note (Signed)
Controlled with diet alone. I have addressed  BMI and recommended a low glycemic index diet utilizing smaller more frequent meals to increase metabolism.  I have also recommended that patient start exercising with a goal of 30 minutes of aerobic exercise a minimum of 5 days per week.  Lab Results  Component Value Date   HGBA1C 6.3 10/04/2016   Lab Results  Component Value Date   MICROALBUR 1.2 04/04/2016

## 2016-10-07 NOTE — Assessment & Plan Note (Signed)
Well controlled on current regimen. Renal function stable, no changes today.  Lab Results  Component Value Date   CREATININE 0.76 10/04/2016   Lab Results  Component Value Date   NA 141 10/04/2016   K 4.5 10/04/2016   CL 105 10/04/2016   CO2 26 10/04/2016

## 2016-10-07 NOTE — Assessment & Plan Note (Signed)
Thyroid function is WNL on current dose.  No current changes needed.   Lab Results  Component Value Date   TSH 2.51 10/04/2016

## 2016-10-07 NOTE — Assessment & Plan Note (Signed)
Presumed , confirmed by GI.    Current liver enzymes are mildly elevated and all modifiable risk factors including obesity, diabetes and hyperlipidemia have been addressed   Lab Results  Component Value Date   ALT 40 (H) 10/04/2016   AST 39 (H) 10/04/2016   ALKPHOS 101 10/04/2016   BILITOT 0.5 10/04/2016

## 2016-10-07 NOTE — Assessment & Plan Note (Signed)
LDL and triglycerides are at goal without medications.  No changes today.  She refuses statin therapy    Lab Results  Component Value Date   CHOL 147 10/04/2016   HDL 33.80 (L) 10/04/2016   LDLCALC 80 10/04/2016   LDLDIRECT 84.0 04/08/2015   TRIG 166.0 (H) 10/04/2016   CHOLHDL 4 10/04/2016

## 2016-10-07 NOTE — Assessment & Plan Note (Signed)
I have congratulated her in reduction of  Weight and encouraged  Continued weight loss with goal of 10% of body weigh over the next 6 months using a low glycemic index diet and regular exercise a minimum of 5 days per week.

## 2016-10-08 ENCOUNTER — Telehealth: Payer: Self-pay | Admitting: Internal Medicine

## 2016-10-08 NOTE — Telephone Encounter (Signed)
Pt called back returning your call. Pt will be available at number below until 2:10. Please advise, thank you!  Call pt @ [phone]

## 2016-10-11 NOTE — Progress Notes (Signed)
Called to schedule the vaccines and the patient has declined them as she believes that at her age it is not important to have them, thanks

## 2016-11-05 ENCOUNTER — Telehealth: Payer: Self-pay | Admitting: Internal Medicine

## 2016-11-05 NOTE — Telephone Encounter (Signed)
Left pt message asking to call Allison back directly at [phone] to schedule AWV. Thanks! °

## 2016-11-06 ENCOUNTER — Telehealth: Payer: Self-pay | Admitting: Radiology

## 2016-11-06 ENCOUNTER — Encounter: Payer: Self-pay | Admitting: Internal Medicine

## 2016-11-06 DIAGNOSIS — K760 Fatty (change of) liver, not elsewhere classified: Secondary | ICD-10-CM

## 2016-11-06 NOTE — Telephone Encounter (Signed)
Pt coming in for labs tomorrow, please place future orders. Thank you.  

## 2016-11-06 NOTE — Addendum Note (Signed)
Addended by: [name] on: 11/06/2016 09:58 AM   Modules accepted: Orders

## 2016-11-06 NOTE — Telephone Encounter (Addendum)
Hepatic panel ordered.  Last month,  When I put this in  The message to patient , this is my way of saying to staff that  it is ok to order it (forwarding  This On to Fransisco Beau since he called the patient last month and told her to return in a month for a hepatic panel but did not order it )

## 2016-11-07 ENCOUNTER — Other Ambulatory Visit (INDEPENDENT_AMBULATORY_CARE_PROVIDER_SITE_OTHER): Payer: Medicare Other

## 2016-11-07 DIAGNOSIS — K760 Fatty (change of) liver, not elsewhere classified: Secondary | ICD-10-CM

## 2016-11-07 LAB — HEPATIC FUNCTION PANEL
ALK PHOS: 94 U/L (ref 39–117)
ALT: 32 U/L (ref 0–35)
AST: 33 U/L (ref 0–37)
Albumin: 4.2 g/dL (ref 3.5–5.2)
BILIRUBIN DIRECT: 0.1 mg/dL (ref 0.0–0.3)
TOTAL PROTEIN: 7.9 g/dL (ref 6.0–8.3)
Total Bilirubin: 0.5 mg/dL (ref 0.2–1.2)

## 2016-11-09 ENCOUNTER — Telehealth: Payer: Self-pay | Admitting: *Deleted

## 2016-11-09 NOTE — Telephone Encounter (Signed)
Patient requested to have her bone density test cancelled at [facility] breast and moved to [name]. Pt contact [phone]

## 2016-12-03 NOTE — Telephone Encounter (Signed)
Left pt message asking to call Allison back directly at [phone] to schedule AWV. Thanks! °

## 2016-12-04 DIAGNOSIS — Z780 Asymptomatic menopausal state: Secondary | ICD-10-CM | POA: Diagnosis not present

## 2016-12-04 DIAGNOSIS — E2839 Other primary ovarian failure: Secondary | ICD-10-CM | POA: Diagnosis not present

## 2016-12-04 LAB — HM DEXA SCAN: HM Dexa Scan: NORMAL

## 2016-12-06 DIAGNOSIS — H401131 Primary open-angle glaucoma, bilateral, mild stage: Secondary | ICD-10-CM | POA: Diagnosis not present

## 2016-12-06 DIAGNOSIS — H35373 Puckering of macula, bilateral: Secondary | ICD-10-CM | POA: Diagnosis not present

## 2016-12-13 ENCOUNTER — Telehealth: Payer: Self-pay | Admitting: Internal Medicine

## 2016-12-13 NOTE — Telephone Encounter (Signed)
DEXA scan was normal.  No osteoporosis.  Continue trying to get  1200 mg calcium daily through diet and supplements,.  1000 IUS Vit D3 daily and participate in a regular walking program for weight bearing exercise.

## 2016-12-13 NOTE — Telephone Encounter (Signed)
Attempted to call pt. No answer, no voicemail.  

## 2016-12-18 NOTE — Telephone Encounter (Signed)
LMTCB

## 2016-12-18 NOTE — Telephone Encounter (Signed)
Pt called back returning your call. Pt states that she will be leaving shortly to go out. Please advise, thank you!  Call pt @ [phone]

## 2016-12-18 NOTE — Telephone Encounter (Signed)
Spoke with pt and informed her of her dexa scan results. Pt gave a verbal understanding.  

## 2016-12-24 ENCOUNTER — Other Ambulatory Visit: Payer: Medicare Other

## 2017-01-03 ENCOUNTER — Ambulatory Visit: Payer: Medicare Other | Admitting: Internal Medicine

## 2017-02-18 ENCOUNTER — Other Ambulatory Visit: Payer: Self-pay | Admitting: Internal Medicine

## 2017-02-20 ENCOUNTER — Ambulatory Visit: Payer: Medicare Other | Admitting: Internal Medicine

## 2017-04-08 ENCOUNTER — Encounter: Payer: Self-pay | Admitting: Internal Medicine

## 2017-04-08 ENCOUNTER — Ambulatory Visit (INDEPENDENT_AMBULATORY_CARE_PROVIDER_SITE_OTHER): Payer: Medicare Other | Admitting: Internal Medicine

## 2017-04-08 VITALS — BP 118/56 | HR 69 | Temp 96.1°F | Resp 15 | Ht 62.0 in | Wt 186.8 lb

## 2017-04-08 DIAGNOSIS — I10 Essential (primary) hypertension: Secondary | ICD-10-CM | POA: Diagnosis not present

## 2017-04-08 DIAGNOSIS — Z0000 Encounter for general adult medical examination without abnormal findings: Secondary | ICD-10-CM

## 2017-04-08 DIAGNOSIS — E039 Hypothyroidism, unspecified: Secondary | ICD-10-CM | POA: Diagnosis not present

## 2017-04-08 DIAGNOSIS — E119 Type 2 diabetes mellitus without complications: Secondary | ICD-10-CM

## 2017-04-08 DIAGNOSIS — E6609 Other obesity due to excess calories: Secondary | ICD-10-CM

## 2017-04-08 DIAGNOSIS — Z6832 Body mass index (BMI) 32.0-32.9, adult: Secondary | ICD-10-CM | POA: Diagnosis not present

## 2017-04-08 DIAGNOSIS — Z1239 Encounter for other screening for malignant neoplasm of breast: Secondary | ICD-10-CM

## 2017-04-08 DIAGNOSIS — K760 Fatty (change of) liver, not elsewhere classified: Secondary | ICD-10-CM | POA: Diagnosis not present

## 2017-04-08 DIAGNOSIS — Z1231 Encounter for screening mammogram for malignant neoplasm of breast: Secondary | ICD-10-CM | POA: Diagnosis not present

## 2017-04-08 DIAGNOSIS — E785 Hyperlipidemia, unspecified: Secondary | ICD-10-CM

## 2017-04-08 LAB — COMPREHENSIVE METABOLIC PANEL
ALK PHOS: 114 U/L (ref 39–117)
ALT: 39 U/L — AB (ref 0–35)
AST: 36 U/L (ref 0–37)
Albumin: 4.2 g/dL (ref 3.5–5.2)
BILIRUBIN TOTAL: 0.5 mg/dL (ref 0.2–1.2)
BUN: 15 mg/dL (ref 6–23)
CALCIUM: 9.8 mg/dL (ref 8.4–10.5)
CO2: 24 mEq/L (ref 19–32)
Chloride: 104 mEq/L (ref 96–112)
Creatinine, Ser: 0.81 mg/dL (ref 0.40–1.20)
GFR: 71.81 mL/min (ref >60.00)
Glucose, Bld: 133 mg/dL — ABNORMAL HIGH (ref 70–99)
POTASSIUM: 5.2 meq/L — AB (ref 3.5–5.1)
Sodium: 140 mEq/L (ref 135–145)
TOTAL PROTEIN: 8 g/dL (ref 6.0–8.3)

## 2017-04-08 LAB — LIPID PANEL
Cholesterol: 145 mg/dL (ref 0–200)
HDL: 33.9 mg/dL — AB (ref >39.00)
LDL Cholesterol: 81 mg/dL (ref 0–99)
NonHDL: 111.59
TRIGLYCERIDES: 154 mg/dL — AB (ref 0.0–149.0)
Total CHOL/HDL Ratio: 4
VLDL: 30.8 mg/dL (ref 0.0–40.0)

## 2017-04-08 LAB — TSH: TSH: 3.43 u[IU]/mL (ref 0.35–4.50)

## 2017-04-08 LAB — LDL CHOLESTEROL, DIRECT: LDL DIRECT: 80 mg/dL

## 2017-04-08 LAB — HEMOGLOBIN A1C: Hgb A1c MFr Bld: 6.5 % (ref 4.6–6.5)

## 2017-04-08 NOTE — Progress Notes (Signed)
Subjective:  Patient ID: Lauren Morris, female    DOB: 07/09/1931  Age: 83 y.o. MRN: [id number]  CC: There were no encounter diagnoses.  HPI KADIN BERA presents for follow up on hypertension,  Hypothyroidism,. Type 2  with fatty liver  Hypertension: patient checks blood pressure twice weekly at home.  Readings have been for the most part < 140/80 at rest . Patient is following a reduced salt diet most days and is taking medications as prescribed  6 month follow up on diabetes.  Patient has no complaints today.  Patient is following a low glycemic index diet and.  Fasting sugars have been under less than 140 most of the time and post prandials have been under 160 except on rare occasions. Patient is not exercising or  intentionally trying to lose weight .  Patient has had an eye exam in the last 6 months and checks feet regularly for signs of infection.  Patient does not walk barefoot outside,  And denies an numbness tingling or burning in feet. Patient is up to date on all recommended vaccinations  Not exercising regularly  Foot exam normal.  gets pedicures regularly  6 month follow up Eye exam in December with [facility] for  glaucoma  Outpatient Medications Prior to Visit  Medication Sig Dispense Refill  . amLODipine (NORVASC) 10 MG tablet TAKE ONE TABLET BY MOUTH EVERY DAY 30 tablet 3  . Calcium Carb-Cholecalciferol (CALCIUM 1000 + D PO) Take 1 tablet by mouth daily.      . Cholecalciferol (VITAMIN D) 1000 UNITS capsule Take 1,000 Units by mouth daily.      . fish oil-omega-3 fatty acids 1000 MG capsule Take 2 g by mouth daily.      . furosemide (LASIX) 20 MG tablet TAKE 1 TABLET BY MOUTH EVERY DAY AS NEEDED FLUID RETENTION 30 tablet 3  . hepatitis A-hepatitis B (TWINRIX) 720-20 ELU-MCG/ML injection INJECT ONCE , REPEAT IN ONE MONTH AND IN 6 MONTHS 0.5 mL 2  . levothyroxine (SYNTHROID, LEVOTHROID) 25 MCG tablet TAKE ONE TABLET BY MOUTH EVERY DAY 30 tablet 8  . timolol  (BETIMOL) 0.25 % ophthalmic solution Place 1 drop into both eyes 2 (two) times daily.     . travoprost, benzalkonium, (TRAVATAN) 0.004 % ophthalmic solution Place 1 drop into both eyes at bedtime.    Marland Kitchen azithromycin (ZITHROMAX) 250 MG tablet 2 tablets on day 1,  Then One tablet daily thereafter until gone (Patient not taking: Reported on 04/08/2017) 6 tablet 0  . benzonatate (TESSALON) 100 MG capsule Take 1 capsule (100 mg total) by mouth 2 (two) times daily as needed for cough. (Patient not taking: Reported on 04/08/2017) 20 capsule 0   No facility-administered medications prior to visit.     Review of Systems;  Patient denies headache, fevers, malaise, unintentional weight loss, skin rash, eye pain, sinus congestion and sinus pain, sore throat, dysphagia,  hemoptysis , cough, dyspnea, wheezing, chest pain, palpitations, orthopnea, edema, abdominal pain, nausea, melena, diarrhea, constipation, flank pain, dysuria, hematuria, urinary  Frequency, nocturia, numbness, tingling, seizures,  Focal weakness, Loss of consciousness,  Tremor, insomnia, depression, anxiety, and suicidal ideation.      Objective:  BP (!) 118/56 (BP Location: Left Arm, Patient Position: Sitting, Cuff Size: Large)   Pulse 69   Temp (!) 96.1 F (35.6 C) (Oral)   Resp 15   Ht 5\' 2"  (1.575 m)   Wt 186 lb 12.8 oz (84.7 kg)   SpO2 96%  BMI 34.17 kg/m   BP Readings from Last 3 Encounters:  04/08/17 (!) 118/56  10/04/16 124/70  07/31/16 134/64    Wt Readings from Last 3 Encounters:  04/08/17 186 lb 12.8 oz (84.7 kg)  10/04/16 186 lb (84.4 kg)  07/31/16 190 lb 3.2 oz (86.3 kg)    General appearance: alert, cooperative and appears stated age Ears: normal TM's and external ear canals both ears Throat: lips, mucosa, and tongue normal; teeth and gums normal Neck: no adenopathy, no carotid bruit, supple, symmetrical, trachea midline and thyroid not enlarged, symmetric, no tenderness/mass/nodules Back: symmetric, no  curvature. ROM normal. No CVA tenderness. Lungs: clear to auscultation bilaterally Heart: regular rate and rhythm, S1, S2 normal, no murmur, click, rub or gallop Abdomen: soft, non-tender; bowel sounds normal; no masses,  no organomegaly Pulses: 2+ and symmetric Skin: Skin color, texture, turgor normal. No rashes or lesions Lymph nodes: Cervical, supraclavicular, and axillary nodes normal.  Lab Results  Component Value Date   HGBA1C 6.3 10/04/2016   HGBA1C 6.3 04/04/2016   HGBA1C 6.3 10/05/2015    Lab Results  Component Value Date   CREATININE 0.76 10/04/2016   CREATININE 0.82 04/04/2016   CREATININE 0.84 10/05/2015    Lab Results  Component Value Date   WBC 7.3 04/04/2016   HGB 15.7 (H) 04/04/2016   HCT 46.7 (H) 04/04/2016   PLT 252.0 04/04/2016   GLUCOSE 127 (H) 10/04/2016   CHOL 147 10/04/2016   TRIG 166.0 (H) 10/04/2016   HDL 33.80 (L) 10/04/2016   LDLDIRECT 84.0 04/08/2015   LDLCALC 80 10/04/2016   ALT 32 11/07/2016   AST 33 11/07/2016   NA 141 10/04/2016   K 4.5 10/04/2016   CL 105 10/04/2016   CREATININE 0.76 10/04/2016   BUN 13 10/04/2016   CO2 26 10/04/2016   TSH 2.51 10/04/2016   HGBA1C 6.3 10/04/2016   MICROALBUR 1.2 04/04/2016    Ct Chest Wo Contrast  Result Date: 05/22/2016 CLINICAL DATA:  Shortness of breath with activity and cough EXAM: CT CHEST WITHOUT CONTRAST TECHNIQUE: Multidetector CT imaging of the chest was performed following the standard protocol without IV contrast. COMPARISON:  05/08/2016, 07/23/2008 FINDINGS: Cardiovascular: Somewhat limited due the lack of IV contrast. Calcific changes are noted within the thoracic aorta without aneurysmal dilatation. Mild coronary calcifications are seen. Cardiac structures are not significantly enlarged. Mediastinum/Nodes: No significant hilar or mediastinal adenopathy is noted. The thoracic inlet is within normal limits. No axillary adenopathy is seen. Lungs/Pleura: Some patchy mosaic attenuation is  noted throughout both lungs but relatively stable from 2010. No focal confluent infiltrate is seen although some mild atelectatic changes are noted in the left lower lobe anteriorly as well as the right middle lobe. No sizable effusion is seen. No parenchymal nodules are noted. Upper Abdomen: No acute abnormality. Musculoskeletal: Degenerative changes of the thoracic spine are noted. No acute bony abnormality is seen. IMPRESSION: Bibasilar atelectatic changes as described. No focal confluent infiltrate is seen. Chronic patchy mosaic attenuation throughout both lungs. Stable from the previous exam Electronically Signed   By: Inez Catalina M.D.   On: 05/22/2016 11:17    Assessment & Plan:   Problem List Items Addressed This Visit    None      I have discontinued Ms. Esper's azithromycin and benzonatate. I am also having her maintain her Calcium Carb-Cholecalciferol (CALCIUM 1000 + D PO), Vitamin D, fish oil-omega-3 fatty acids, travoprost (benzalkonium), timolol, hepatitis A-hepatitis B, furosemide, levothyroxine, and amLODipine.  No orders of  the defined types were placed in this encounter.   Medications Discontinued During This Encounter  Medication Reason  . azithromycin (ZITHROMAX) 250 MG tablet Patient has not taken in last 30 days  . benzonatate (TESSALON) 100 MG capsule Patient has not taken in last 30 days    Follow-up: No Follow-up on file.   [name], MD

## 2017-04-08 NOTE — Patient Instructions (Signed)
Diabetes Mellitus and Food It is important for you to manage your blood sugar (glucose) level. Your blood glucose level can be greatly affected by what you eat. Eating healthier foods in the appropriate amounts throughout the day at about the same time each day will help you control your blood glucose level. It can also help slow or prevent worsening of your diabetes mellitus. Healthy eating may even help you improve the level of your blood pressure and reach or maintain a healthy weight. General recommendations for healthful eating and cooking habits include:  Eating meals and snacks regularly. Avoid going long periods of time without eating to lose weight.  Eating a diet that consists mainly of plant-based foods, such as fruits, vegetables, nuts, legumes, and whole grains.  Using low-heat cooking methods, such as baking, instead of high-heat cooking methods, such as deep frying.  Work with your dietitian to make sure you understand how to use the Nutrition Facts information on food labels. How can food affect me? Carbohydrates Carbohydrates affect your blood glucose level more than any other type of food. Your dietitian will help you determine how many carbohydrates to eat at each meal and teach you how to count carbohydrates. Counting carbohydrates is important to keep your blood glucose at a healthy level, especially if you are using insulin or taking certain medicines for diabetes mellitus. Alcohol Alcohol can cause sudden decreases in blood glucose (hypoglycemia), especially if you use insulin or take certain medicines for diabetes mellitus. Hypoglycemia can be a life-threatening condition. Symptoms of hypoglycemia (sleepiness, dizziness, and disorientation) are similar to symptoms of having too much alcohol. If your health care provider has given you approval to drink alcohol, do so in moderation and use the following guidelines:  Women should not have more than one drink per day, and men  should not have more than two drinks per day. One drink is equal to: ? 12 oz of beer. ? 5 oz of wine. ? 1 oz of hard liquor.  Do not drink on an empty stomach.  Keep yourself hydrated. Have water, diet soda, or unsweetened iced tea.  Regular soda, juice, and other mixers might contain a lot of carbohydrates and should be counted.  What foods are not recommended? As you make food choices, it is important to remember that all foods are not the same. Some foods have fewer nutrients per serving than other foods, even though they might have the same number of calories or carbohydrates. It is difficult to get your body what it needs when you eat foods with fewer nutrients. Examples of foods that you should avoid that are high in calories and carbohydrates but low in nutrients include:  Trans fats (most processed foods list trans fats on the Nutrition Facts label).  Regular soda.  Juice.  Candy.  Sweets, such as cake, pie, doughnuts, and cookies.  Fried foods.  What foods can I eat? Eat nutrient-rich foods, which will nourish your body and keep you healthy. The food you should eat also will depend on several factors, including:  The calories you need.  The medicines you take.  Your weight.  Your blood glucose level.  Your blood pressure level.  Your cholesterol level.  You should eat a variety of foods, including:  Protein. ? Lean cuts of meat. ? Proteins low in saturated fats, such as fish, egg whites, and beans. Avoid processed meats.  Fruits and vegetables. ? Fruits and vegetables that may help control blood glucose levels, such as apples,   mangoes, and yams.  Dairy products. ? Choose fat-free or low-fat dairy products, such as milk, yogurt, and cheese.  Grains, bread, pasta, and rice. ? Choose whole grain products, such as multigrain bread, whole oats, and brown rice. These foods may help control blood pressure.  Fats. ? Foods containing healthful fats, such as  nuts, avocado, olive oil, canola oil, and fish.  Does everyone with diabetes mellitus have the same meal plan? Because every person with diabetes mellitus is different, there is not one meal plan that works for everyone. It is very important that you meet with a dietitian who will help you create a meal plan that is just right for you. This information is not intended to replace advice given to you by your health care provider. Make sure you discuss any questions you have with your health care provider. Document Released: 03/08/2005 Document Revised: 11/17/2015 Document Reviewed: 05/08/2013 Elsevier Interactive Patient Education  2017 [employer] Inc[employer]  

## 2017-04-08 NOTE — Assessment & Plan Note (Signed)
Presumed , confirmed by GI.    Current liver enzymes have not been elevated and all modifiable risk factors including obesity, diabetes and hyperlipidemia have been addressed   Lab Results  Component Value Date   ALT 32 11/07/2016   AST 33 11/07/2016   ALKPHOS 94 11/07/2016   BILITOT 0.5 11/07/2016

## 2017-04-08 NOTE — Assessment & Plan Note (Signed)
Well controlled on current regimen. Renal function due.,   no changes today. 

## 2017-04-08 NOTE — Assessment & Plan Note (Signed)
LDL and triglycerides are at goal without medications.  Repeating today.  She refuses statin therapy    Lab Results  Component Value Date   CHOL 147 10/04/2016   HDL 33.80 (L) 10/04/2016   LDLCALC 80 10/04/2016   LDLDIRECT 84.0 04/08/2015   TRIG 166.0 (H) 10/04/2016   CHOLHDL 4 10/04/2016

## 2017-04-08 NOTE — Assessment & Plan Note (Signed)
She has no signs of symptoms of under over active thyroid . TSH is pending

## 2017-04-08 NOTE — Assessment & Plan Note (Signed)
I have addressed  BMI and recommended wt loss of 10% of body weigh over the next 6 months using a low glycemic index diet and regular exercise a minimum of 5 days per week.   

## 2017-04-09 ENCOUNTER — Encounter: Payer: Self-pay | Admitting: *Deleted

## 2017-04-12 ENCOUNTER — Other Ambulatory Visit: Payer: Medicare Other

## 2017-04-12 DIAGNOSIS — E119 Type 2 diabetes mellitus without complications: Secondary | ICD-10-CM | POA: Diagnosis not present

## 2017-04-12 DIAGNOSIS — Z23 Encounter for immunization: Secondary | ICD-10-CM | POA: Diagnosis not present

## 2017-04-12 LAB — MICROALBUMIN / CREATININE URINE RATIO
CREATININE, U: 27.3 mg/dL
MICROALB UR: 0.9 mg/dL (ref 0.0–1.9)
Microalb Creat Ratio: 3.3 mg/g (ref 0.0–30.0)

## 2017-05-17 ENCOUNTER — Other Ambulatory Visit: Payer: Self-pay | Admitting: Internal Medicine

## 2017-05-20 ENCOUNTER — Ambulatory Visit (INDEPENDENT_AMBULATORY_CARE_PROVIDER_SITE_OTHER): Payer: Medicare Other | Admitting: Family Medicine

## 2017-05-20 ENCOUNTER — Encounter: Payer: Self-pay | Admitting: Family Medicine

## 2017-05-20 ENCOUNTER — Other Ambulatory Visit: Payer: Self-pay

## 2017-05-20 VITALS — BP 134/64 | HR 79 | Temp 97.5°F | Wt 191.2 lb

## 2017-05-20 DIAGNOSIS — M25562 Pain in left knee: Secondary | ICD-10-CM

## 2017-05-20 MED ORDER — PREDNISONE 10 MG PO TABS
ORAL_TABLET | ORAL | 0 refills | Status: DC
Start: 2017-05-20 — End: 2018-04-17

## 2017-05-20 NOTE — Progress Notes (Signed)
Subjective:     Patient ID: Lauren Morris, female    DOB: 06/28/1931, 83 y.o.   MRN: [id number]  HPI  Lauren Morris is an 83 year old female who presents left knee pain that has been present for one week. Pain is noted as a 5 and aching. She denies any history of trauma. Reports that she noticed pain after getting up from a seated position and turning to her left. She denies any pops or clicks or difficulty with ambulation Pain is better today and is aggravated with sitting or standing for extended periods of time. She reports that pain was worse on Thanksgiving after increased activity that day. She reports a history of arthritis where she takes extra strength tylenol daily that has provided benefit for her. Treat with acetaminophen and ibuprofen has provided benefit for her but pain is still present.  She reports mild swelling. Denies fever, chills, sweats, N/V, erythema or warmth of knee. No history of knee replacement.     Review of Systems  Constitutional: Negative for chills, fatigue and fever.  Respiratory: Negative for cough, shortness of breath and wheezing.   Cardiovascular: Negative for chest pain and palpitations.  Gastrointestinal: Negative for abdominal pain, diarrhea, nausea and vomiting.  Musculoskeletal: Negative for myalgias.       Left knee pain  Skin: Negative for rash.  Neurological: Negative for dizziness, weakness, light-headedness, numbness and headaches.  Psychiatric/Behavioral:       Denies depressed or anxious mood   Past Medical History:  Diagnosis Date  . Cyst    hx o on right breast  . DJD (degenerative joint disease)    right knee  . Glaucoma   . Hypertension   . Thyroid disease      Social History   Socioeconomic History  . Marital status: Widowed    Spouse name: Not on file  . Number of children: Not on file  . Years of education: Not on file  . Highest education level: Not on file  Social Needs  . Financial resource strain: Not on file  .  Food insecurity - worry: Not on file  . Food insecurity - inability: Not on file  . Transportation needs - medical: Not on file  . Transportation needs - non-medical: Not on file  Occupational History  . Not on file  Tobacco Use  . Smoking status: Never Smoker  . Smokeless tobacco: Never Used  Substance and Sexual Activity  . Alcohol use: No  . Drug use: No  . Sexual activity: Not on file  Other Topics Concern  . Not on file  Social History Narrative  . Not on file    Past Surgical History:  Procedure Laterality Date  . ABDOMINAL HYSTERECTOMY    . VITRECTOMY  July 2012   right eye with membrane peel Lauren Morris, [place])    Family History  Problem Relation Age of Onset  . Cancer Father     No Known Allergies  Current Outpatient Medications on File Prior to Visit  Medication Sig Dispense Refill  . amLODipine (NORVASC) 10 MG tablet TAKE ONE TABLET BY MOUTH EVERY DAY 30 tablet 3  . Calcium Carb-Cholecalciferol (CALCIUM 1000 + D PO) Take 1 tablet by mouth daily.      . Cholecalciferol (VITAMIN D) 1000 UNITS capsule Take 1,000 Units by mouth daily.      . fish oil-omega-3 fatty acids 1000 MG capsule Take 2 g by mouth daily.      . furosemide (  LASIX) 20 MG tablet TAKE 1 TABLET BY MOUTH EVERY DAY AS NEEDED FLUID RETENTION 30 tablet 3  . levothyroxine (SYNTHROID, LEVOTHROID) 25 MCG tablet TAKE ONE TABLET BY MOUTH EVERY DAY 30 tablet 8  . timolol (BETIMOL) 0.25 % ophthalmic solution Place 1 drop into both eyes 2 (two) times daily.     . travoprost, benzalkonium, (TRAVATAN) 0.004 % ophthalmic solution Place 1 drop into both eyes at bedtime.     No current facility-administered medications on file prior to visit.     BP 134/64   Pulse 79   Temp (!) 97.5 F (36.4 C) (Oral)   Wt 191 lb 3.2 oz (86.7 kg)   SpO2 98%   BMI 34.97 kg/m       Objective:   Physical Exam  Constitutional: She is oriented to person, place, and time. She appears well-developed and well-nourished.  Eyes:  Pupils are equal, round, and reactive to light. No scleral icterus.  Cardiovascular: Normal rate and regular rhythm.  Pulmonary/Chest: Effort normal and breath sounds normal. She has no wheezes. She has no rales.  Abdominal: Soft. Bowel sounds are normal. There is no tenderness.  Musculoskeletal:  Knee: Normal to inspection with no erythema or effusion or obvious bony abnormalities.  Palpation normal with no warmth or joint line tenderness or patellar tenderness or condyle tenderness. ROM normal in flexion and extension and lower leg rotation. Hamstring and quadriceps strength is normal.  Lymphadenopathy:    She has no cervical adenopathy.  Neurological: She is alert and oriented to person, place, and time. Coordination normal.  Skin: Skin is warm and dry. No rash noted.  Psychiatric: She has a normal mood and affect. Her behavior is normal. Judgment and thought content normal.       Assessment & Plan:  1. Left knee pain, unspecified chronicity Exam is reassuring; suspect that symptom is aggravated from increased activity and history of arthritis. Will trial a short course of prednisone and conservative therapy for treatment. If symptoms do not improve with treatment, worsen, or she develops new symptoms, she will follow up with PCP for further evaluation. We discussed imaging if symptoms do not improve, she politely declined imaging today. Follow up for routine care as recommended by PCP or sooner if needed.  Delano Metz, FNP-C

## 2017-05-20 NOTE — Patient Instructions (Addendum)
Please take medication as directed with food and follow directions below. If symptoms do not improve with treatment, worsen, or you develop new symptoms, please follow up with your provider for further evaluation and treatment.   Knee Pain, Adult Many things can cause knee pain. The pain often goes away on its own with time and rest. If the pain does not go away, tests may be done to find out what is causing the pain. Follow these instructions at home: Activity  Rest your knee.  Do not do things that cause pain.  Avoid activities where both feet leave the ground at the same time (high-impact activities). Examples are running, jumping rope, and doing jumping jacks. General instructions  Take medicines only as told by your doctor.  Raise (elevate) your knee when you are resting. Make sure your knee is higher than your heart.  Sleep with a pillow under your knee.  If told, put ice on the knee: ? Put ice in a plastic bag. ? Place a towel between your skin and the bag. ? Leave the ice on for 20 minutes, 2-3 times a day.  Ask your doctor if you should wear an elastic knee support.  Lose weight if you are overweight. Being overweight can make your knee hurt more.  Do not use any tobacco products. These include cigarettes, chewing tobacco, or electronic cigarettes. If you need help quitting, ask your doctor. Smoking may slow down healing. Contact a doctor if:  The pain does not stop.  The pain changes or gets worse.  You have a fever along with knee pain.  Your knee gives out or locks up.  Your knee swells, and becomes worse. Get help right away if:  Your knee feels warm.  You cannot move your knee.  You have very bad knee pain.  You have chest pain.  You have trouble breathing. Summary  Many things can cause knee pain. The pain often goes away on its own with time and rest.  Avoid activities that put stress on your knee. These include running and jumping rope.  Get  help right away if you cannot move your knee, or if your knee feels warm, or if you have trouble breathing. This information is not intended to replace advice given to you by your health care provider. Make sure you discuss any questions you have with your health care provider. Document Released: 09/07/2008 Document Revised: 06/05/2016 Document Reviewed: 06/05/2016 Elsevier Interactive Patient Education  2017 [employer].

## 2017-05-31 DIAGNOSIS — S83412A Sprain of medial collateral ligament of left knee, initial encounter: Secondary | ICD-10-CM | POA: Diagnosis not present

## 2017-05-31 DIAGNOSIS — M1712 Unilateral primary osteoarthritis, left knee: Secondary | ICD-10-CM | POA: Diagnosis not present

## 2017-06-06 DIAGNOSIS — H35373 Puckering of macula, bilateral: Secondary | ICD-10-CM | POA: Diagnosis not present

## 2017-06-06 DIAGNOSIS — H401131 Primary open-angle glaucoma, bilateral, mild stage: Secondary | ICD-10-CM | POA: Diagnosis not present

## 2017-06-19 ENCOUNTER — Other Ambulatory Visit: Payer: Self-pay | Admitting: Internal Medicine

## 2017-06-28 DIAGNOSIS — S83412A Sprain of medial collateral ligament of left knee, initial encounter: Secondary | ICD-10-CM | POA: Diagnosis not present

## 2017-06-28 DIAGNOSIS — M1712 Unilateral primary osteoarthritis, left knee: Secondary | ICD-10-CM | POA: Diagnosis not present

## 2017-07-11 DIAGNOSIS — Z1231 Encounter for screening mammogram for malignant neoplasm of breast: Secondary | ICD-10-CM | POA: Diagnosis not present

## 2017-07-11 LAB — HM MAMMOGRAPHY

## 2017-07-25 ENCOUNTER — Encounter: Payer: Self-pay | Admitting: Internal Medicine

## 2017-09-30 LAB — HM DIABETES EYE EXAM

## 2017-10-04 ENCOUNTER — Other Ambulatory Visit: Payer: Self-pay | Admitting: Internal Medicine

## 2017-10-16 ENCOUNTER — Ambulatory Visit (INDEPENDENT_AMBULATORY_CARE_PROVIDER_SITE_OTHER): Payer: Medicare Other | Admitting: Internal Medicine

## 2017-10-16 ENCOUNTER — Encounter: Payer: Self-pay | Admitting: Internal Medicine

## 2017-10-16 VITALS — BP 116/70 | HR 67 | Temp 97.6°F | Resp 15 | Ht 62.0 in | Wt 187.0 lb

## 2017-10-16 DIAGNOSIS — E785 Hyperlipidemia, unspecified: Secondary | ICD-10-CM

## 2017-10-16 DIAGNOSIS — E119 Type 2 diabetes mellitus without complications: Secondary | ICD-10-CM | POA: Diagnosis not present

## 2017-10-16 DIAGNOSIS — E6609 Other obesity due to excess calories: Secondary | ICD-10-CM

## 2017-10-16 DIAGNOSIS — K760 Fatty (change of) liver, not elsewhere classified: Secondary | ICD-10-CM

## 2017-10-16 DIAGNOSIS — E039 Hypothyroidism, unspecified: Secondary | ICD-10-CM | POA: Diagnosis not present

## 2017-10-16 DIAGNOSIS — Z6832 Body mass index (BMI) 32.0-32.9, adult: Secondary | ICD-10-CM | POA: Diagnosis not present

## 2017-10-16 DIAGNOSIS — I10 Essential (primary) hypertension: Secondary | ICD-10-CM | POA: Diagnosis not present

## 2017-10-16 LAB — LIPID PANEL
Cholesterol: 147 mg/dL (ref 0–200)
HDL: 35.4 mg/dL — ABNORMAL LOW (ref >39.00)
LDL CALC: 83 mg/dL (ref 0–99)
NONHDL: 111.31
Total CHOL/HDL Ratio: 4
Triglycerides: 142 mg/dL (ref 0.0–149.0)
VLDL: 28.4 mg/dL (ref 0.0–40.0)

## 2017-10-16 LAB — COMPREHENSIVE METABOLIC PANEL
ALBUMIN: 4.1 g/dL (ref 3.5–5.2)
ALT: 36 U/L — AB (ref 0–35)
AST: 37 U/L (ref 0–37)
Alkaline Phosphatase: 99 U/L (ref 39–117)
BILIRUBIN TOTAL: 0.4 mg/dL (ref 0.2–1.2)
BUN: 15 mg/dL (ref 6–23)
CHLORIDE: 105 meq/L (ref 96–112)
CO2: 27 meq/L (ref 19–32)
CREATININE: 0.75 mg/dL (ref 0.40–1.20)
Calcium: 9.7 mg/dL (ref 8.4–10.5)
GFR: 78.37 mL/min (ref >60.00)
Glucose, Bld: 110 mg/dL — ABNORMAL HIGH (ref 70–99)
Potassium: 5.1 mEq/L (ref 3.5–5.1)
SODIUM: 138 meq/L (ref 135–145)
Total Protein: 7.7 g/dL (ref 6.0–8.3)

## 2017-10-16 LAB — HEMOGLOBIN A1C: HEMOGLOBIN A1C: 6.4 % (ref 4.6–6.5)

## 2017-10-16 LAB — TSH: TSH: 2.11 u[IU]/mL (ref 0.35–4.50)

## 2017-10-16 NOTE — Patient Instructions (Addendum)
You need to lose 20 lbs over the next year to keep your  Fatty liver from getting worse, and to get your BMI < 30  How can you do this?  Increase your days of walking. You should be walking for 30 minutes 5 days per week   Intermittent fasting has helped many patients lose 20 to 25 lbs. That means fasting from 6 pm to 9 am   All food is restricted to 11 am to 7 pm  Limit your servings of starch (cornbread, bread, potatoes,  Rice, pasta) to 2 per day

## 2017-10-16 NOTE — Progress Notes (Signed)
Subjective:  Patient ID: Lauren Morris, female    DOB: [birth year]  Age: 84 y.o. MRN: [id number]  CC: The primary encounter diagnosis was Fatty liver disease, nonalcoholic. Diagnoses of Hyperlipidemia LDL goal <100, Acquired hypothyroidism, Diabetes mellitus without complication ([place]), Essential hypertension, and Class 1 obesity due to excess calories with body mass index (BMI) of 32.0 to 32.9 in adult, unspecified whether serious comorbidity present were also pertinent to this visit.  HPI Lauren Morris presents for 6 month follow up on type 2 DM with fatty liver,   Hypertension and hypothyroid.  Hypertension: patient checks blood pressure twice weekly at home.  Readings have been for the most part < 140/80 at rest . Patient is following a reduce salt diet most days and is taking medications as prescribed  T2DM:  Diet controlled. Patient does not check blood sugars more than once a week,  Last one was 135 a weekago in a fasting state.  Dos not recall any above 200 lr less than 80.  No complaints today.  Feels that she overeats  Several days per week.  Craves sweets (cookies and pies) that she eats 1-2 per week,  and cornbread nearly every day .    Had a steroid injection in January in her left knee helped a lot.  Told she may require knee replacement eventually .  Does not want to consider it. [place] KitchenNot walking more than 2-3 days per week., and averages 1/2 mile.  Patient voices awareness  of the foods he/she needs to avoid,  And follows a low GI diet about 50% of the time.  Has had an annual diabetic eye exam.  Denies numbness and tingling in lower extremities.  Denies hypoglycemic symptoms.   Last eye exam was 6 months ago for management of glaucoma , every 6 months by [facility]   Lab Results  Component Value Date   HGBA1C 6.4 10/16/2017   Lab Results  Component Value Date   ALT 36 (H) 10/16/2017   AST 37 10/16/2017   ALKPHOS 99 10/16/2017   BILITOT 0.4 10/16/2017    Outpatient  Medications Prior to Visit  Medication Sig Dispense Refill  . Calcium Carb-Cholecalciferol (CALCIUM 1000 + D PO) Take 1 tablet by mouth daily.      . Cholecalciferol (VITAMIN D) 1000 UNITS capsule Take 1,000 Units by mouth daily.      . dorzolamide-timolol (COSOPT) 22.3-6.8 MG/ML ophthalmic solution dorzolamide 22.3 mg-timolol 6.8 mg/mL eye drops    . fish oil-omega-3 fatty acids 1000 MG capsule Take 2 g by mouth daily.      . furosemide (LASIX) 20 MG tablet TAKE ONE TABLET BY MOUTH DAILY AS NEEDEDFOR FLUID RETENTION 30 tablet 3  . predniSONE (DELTASONE) 10 MG tablet Take 4 tablets once daily for 2 days, 3 tabs daily for 2 days, 2 tabs daily for 2 days, 1 tab daily for 2 days. 20 tablet 0  . amLODipine (NORVASC) 10 MG tablet TAKE ONE TABLET BY MOUTH EVERY DAY 30 tablet 3  . levothyroxine (SYNTHROID, LEVOTHROID) 25 MCG tablet TAKE ONE TABLET BY MOUTH EVERY DAY 30 tablet 8  . timolol (BETIMOL) 0.25 % ophthalmic solution Place 1 drop into both eyes 2 (two) times daily.     . travoprost, benzalkonium, (TRAVATAN) 0.004 % ophthalmic solution Place 1 drop into both eyes at bedtime.     No facility-administered medications prior to visit.     Review of Systems;  Patient denies headache, fevers, malaise, unintentional weight  loss, skin rash, eye pain, sinus congestion and sinus pain, sore throat, dysphagia,  hemoptysis , cough, dyspnea, wheezing, chest pain, palpitations, orthopnea, edema, abdominal pain, nausea, melena, diarrhea, constipation, flank pain, dysuria, hematuria, urinary  Frequency, nocturia, numbness, tingling, seizures,  Focal weakness, Loss of consciousness,  Tremor, insomnia, depression, anxiety, and suicidal ideation.      Objective:  BP 116/70 (BP Location: Left Arm, Patient Position: Sitting, Cuff Size: Normal)   Pulse 67   Temp 97.6 F (36.4 C) (Oral)   Resp 15   Ht 5\' 2"  (1.575 m)   Wt 187 lb (84.8 kg)   SpO2 94%   BMI 34.20 kg/m   BP Readings from Last 3 Encounters:    10/16/17 116/70  05/20/17 134/64  04/08/17 (!) 118/56    Wt Readings from Last 3 Encounters:  10/16/17 187 lb (84.8 kg)  05/20/17 191 lb 3.2 oz (86.7 kg)  04/08/17 186 lb 12.8 oz (84.7 kg)    General appearance: alert, cooperative and appears stated age Ears: normal TM's and external ear canals both ears Throat: lips, mucosa, and tongue normal; teeth and gums normal Neck: no adenopathy, no carotid bruit, supple, symmetrical, trachea midline and thyroid not enlarged, symmetric, no tenderness/mass/nodules Back: symmetric, no curvature. ROM normal. No CVA tenderness. Lungs: clear to auscultation bilaterally Heart: regular rate and rhythm, S1, S2 normal, no murmur, click, rub or gallop Abdomen: soft, non-tender; bowel sounds normal; no masses,  no organomegaly Pulses: 2+ and symmetric Skin: Skin color, texture, turgor normal. No rashes or lesions Lymph nodes: Cervical, supraclavicular, and axillary nodes normal.  Lab Results  Component Value Date   HGBA1C 6.4 10/16/2017   HGBA1C 6.5 04/08/2017   HGBA1C 6.3 10/04/2016    Lab Results  Component Value Date   CREATININE 0.75 10/16/2017   CREATININE 0.81 04/08/2017   CREATININE 0.76 10/04/2016    Lab Results  Component Value Date   WBC 7.3 04/04/2016   HGB 15.7 (H) 04/04/2016   HCT 46.7 (H) 04/04/2016   PLT 252.0 04/04/2016   GLUCOSE 110 (H) 10/16/2017   CHOL 147 10/16/2017   TRIG 142.0 10/16/2017   HDL 35.40 (L) 10/16/2017   LDLDIRECT 80.0 04/08/2017   LDLCALC 83 10/16/2017   ALT 36 (H) 10/16/2017   AST 37 10/16/2017   NA 138 10/16/2017   K 5.1 10/16/2017   CL 105 10/16/2017   CREATININE 0.75 10/16/2017   BUN 15 10/16/2017   CO2 27 10/16/2017   TSH 2.11 10/16/2017   HGBA1C 6.4 10/16/2017   MICROALBUR 0.9 04/12/2017    Ct Chest Wo Contrast  Result Date: 05/22/2016 CLINICAL DATA:  Shortness of breath with activity and cough EXAM: CT CHEST WITHOUT CONTRAST TECHNIQUE: Multidetector CT imaging of the chest was  performed following the standard protocol without IV contrast. COMPARISON:  05/08/2016, 07/23/2008 FINDINGS: Cardiovascular: Somewhat limited due the lack of IV contrast. Calcific changes are noted within the thoracic aorta without aneurysmal dilatation. Mild coronary calcifications are seen. Cardiac structures are not significantly enlarged. Mediastinum/Nodes: No significant hilar or mediastinal adenopathy is noted. The thoracic inlet is within normal limits. No axillary adenopathy is seen. Lungs/Pleura: Some patchy mosaic attenuation is noted throughout both lungs but relatively stable from 2010. No focal confluent infiltrate is seen although some mild atelectatic changes are noted in the left lower lobe anteriorly as well as the right middle lobe. No sizable effusion is seen. No parenchymal nodules are noted. Upper Abdomen: No acute abnormality. Musculoskeletal: Degenerative changes of the thoracic spine  are noted. No acute bony abnormality is seen. IMPRESSION: Bibasilar atelectatic changes as described. No focal confluent infiltrate is seen. Chronic patchy mosaic attenuation throughout both lungs. Stable from the previous exam Electronically Signed   By: Inez Catalina M.D.   On: 05/22/2016 11:17    Assessment & Plan:   Problem List Items Addressed This Visit    Fatty liver disease, nonalcoholic - Primary   Relevant Orders   Comprehensive metabolic panel (Completed)   Obesity    I have addressed  BMI and recommended wt loss of  20 lbs  (10% of body weight)  over the next 12 months using a low glycemic index diet and regular exercise a minimum of 5 days per week.        Hyperlipidemia LDL goal <100    LDL and triglycerides are at goal without medications.  Repeating today.  She refuses statin therapy    Lab Results  Component Value Date   CHOL 147 10/16/2017   HDL 35.40 (L) 10/16/2017   LDLCALC 83 10/16/2017   LDLDIRECT 80.0 04/08/2017   TRIG 142.0 10/16/2017   CHOLHDL 4 10/16/2017            Relevant Orders   Lipid panel (Completed)   Essential hypertension    Well controlled on current regimen. Renal function stable, no changes today.  Lab Results  Component Value Date   CREATININE 0.75 10/16/2017   Lab Results  Component Value Date   NA 138 10/16/2017   K 5.1 10/16/2017   CL 105 10/16/2017   CO2 27 10/16/2017         Diabetes mellitus without complication ([place])    Controlled with diet alone. I have addressed  BMI and recommended a low glycemic index diet utilizing smaller more frequent meals to increase metabolism.  I have also recommended that patient start exercising with a goal of 30 minutes of aerobic exercise a minimum of 5 days per week. She refuses to consider statin therapy due to a family history of statin induced liver failure.   Lab Results  Component Value Date   HGBA1C 6.4 10/16/2017   Lab Results  Component Value Date   MICROALBUR 0.9 04/12/2017             Relevant Orders   Hemoglobin A1c (Completed)   Acquired hypothyroidism    Thyroid function is WNL on current dose.  No current changes needed.   Lab Results  Component Value Date   TSH 2.11 10/16/2017         Relevant Orders   TSH (Completed)     A total of 25 minutes of face to face time was spent with patient more than half of which was spent in counselling about the above mentioned conditions  and coordination of care  I have discontinued Lanetra M. Lietz's travoprost (benzalkonium) and timolol. I am also having her maintain her Calcium Carb-Cholecalciferol (CALCIUM 1000 + D PO), Vitamin D, fish oil-omega-3 fatty acids, predniSONE, furosemide, and dorzolamide-timolol.  No orders of the defined types were placed in this encounter.   Medications Discontinued During This Encounter  Medication Reason  . timolol (BETIMOL) 0.25 % ophthalmic solution Change in therapy  . travoprost, benzalkonium, (TRAVATAN) 0.004 % ophthalmic solution Change in therapy     Follow-up: Return in about 6 months (around 04/17/2018) for follow up diabetes.   [name], MD

## 2017-10-17 ENCOUNTER — Other Ambulatory Visit: Payer: Self-pay | Admitting: Internal Medicine

## 2017-10-19 NOTE — Assessment & Plan Note (Addendum)
Controlled with diet alone. I have addressed  BMI and recommended a low glycemic index diet utilizing smaller more frequent meals to increase metabolism.  I have also recommended that patient start exercising with a goal of 30 minutes of aerobic exercise a minimum of 5 days per week. She refuses to consider statin therapy due to a family history of statin induced liver failure.   Lab Results  Component Value Date   HGBA1C 6.4 10/16/2017   Lab Results  Component Value Date   MICROALBUR 0.9 04/12/2017

## 2017-10-19 NOTE — Assessment & Plan Note (Signed)
LDL and triglycerides are at goal without medications.  Repeating today.  She refuses statin therapy    Lab Results  Component Value Date   CHOL 147 10/16/2017   HDL 35.40 (L) 10/16/2017   LDLCALC 83 10/16/2017   LDLDIRECT 80.0 04/08/2017   TRIG 142.0 10/16/2017   CHOLHDL 4 10/16/2017

## 2017-10-19 NOTE — Assessment & Plan Note (Signed)
Thyroid function is WNL on current dose.  No current changes needed.   Lab Results  Component Value Date   TSH 2.11 10/16/2017

## 2017-10-19 NOTE — Assessment & Plan Note (Signed)
I have addressed  BMI and recommended wt loss of  20 lbs  (10% of body weight)  over the next 12 months using a low glycemic index diet and regular exercise a minimum of 5 days per week.

## 2017-10-19 NOTE — Assessment & Plan Note (Signed)
Well controlled on current regimen. Renal function stable, no changes today.  Lab Results  Component Value Date   CREATININE 0.75 10/16/2017   Lab Results  Component Value Date   NA 138 10/16/2017   K 5.1 10/16/2017   CL 105 10/16/2017   CO2 27 10/16/2017

## 2017-12-12 DIAGNOSIS — H401131 Primary open-angle glaucoma, bilateral, mild stage: Secondary | ICD-10-CM | POA: Diagnosis not present

## 2017-12-12 DIAGNOSIS — H35373 Puckering of macula, bilateral: Secondary | ICD-10-CM | POA: Diagnosis not present

## 2017-12-12 DIAGNOSIS — H524 Presbyopia: Secondary | ICD-10-CM | POA: Diagnosis not present

## 2018-02-10 ENCOUNTER — Other Ambulatory Visit: Payer: Self-pay | Admitting: Internal Medicine

## 2018-04-17 ENCOUNTER — Encounter: Payer: Self-pay | Admitting: Internal Medicine

## 2018-04-17 ENCOUNTER — Ambulatory Visit (INDEPENDENT_AMBULATORY_CARE_PROVIDER_SITE_OTHER): Payer: Medicare Other | Admitting: Internal Medicine

## 2018-04-17 VITALS — BP 128/70 | HR 72 | Temp 97.7°F | Resp 15 | Ht 62.0 in | Wt 181.2 lb

## 2018-04-17 DIAGNOSIS — K760 Fatty (change of) liver, not elsewhere classified: Secondary | ICD-10-CM

## 2018-04-17 DIAGNOSIS — Z23 Encounter for immunization: Secondary | ICD-10-CM | POA: Diagnosis not present

## 2018-04-17 DIAGNOSIS — E119 Type 2 diabetes mellitus without complications: Secondary | ICD-10-CM

## 2018-04-17 DIAGNOSIS — E039 Hypothyroidism, unspecified: Secondary | ICD-10-CM | POA: Diagnosis not present

## 2018-04-17 DIAGNOSIS — E6609 Other obesity due to excess calories: Secondary | ICD-10-CM

## 2018-04-17 DIAGNOSIS — Z6833 Body mass index (BMI) 33.0-33.9, adult: Secondary | ICD-10-CM

## 2018-04-17 DIAGNOSIS — I10 Essential (primary) hypertension: Secondary | ICD-10-CM

## 2018-04-17 LAB — COMPREHENSIVE METABOLIC PANEL
ALBUMIN: 4.2 g/dL (ref 3.5–5.2)
ALK PHOS: 119 U/L — AB (ref 39–117)
ALT: 29 U/L (ref 0–35)
AST: 33 U/L (ref 0–37)
BILIRUBIN TOTAL: 0.6 mg/dL (ref 0.2–1.2)
BUN: 12 mg/dL (ref 6–23)
CO2: 25 mEq/L (ref 19–32)
Calcium: 9.5 mg/dL (ref 8.4–10.5)
Chloride: 103 mEq/L (ref 96–112)
Creatinine, Ser: 0.8 mg/dL (ref 0.40–1.20)
GFR: 72.66 mL/min (ref >60.00)
Glucose, Bld: 119 mg/dL — ABNORMAL HIGH (ref 70–99)
Potassium: 4.3 mEq/L (ref 3.5–5.1)
Sodium: 137 mEq/L (ref 135–145)
TOTAL PROTEIN: 8 g/dL (ref 6.0–8.3)

## 2018-04-17 LAB — MICROALBUMIN / CREATININE URINE RATIO
Creatinine,U: 92.9 mg/dL
MICROALB/CREAT RATIO: 1.4 mg/g (ref 0.0–30.0)
Microalb, Ur: 1.3 mg/dL (ref 0.0–1.9)

## 2018-04-17 LAB — POCT GLYCOSYLATED HEMOGLOBIN (HGB A1C): Hemoglobin A1C: 5.9 % — AB (ref 4.0–5.6)

## 2018-04-17 LAB — TSH: TSH: 2.49 u[IU]/mL (ref 0.35–4.50)

## 2018-04-17 MED ORDER — ZOSTER VAC RECOMB ADJUVANTED 50 MCG/0.5ML IM SUSR: 0.5000 mL | Freq: Once | INTRAMUSCULAR | 1 refills | Status: AC

## 2018-04-17 NOTE — Progress Notes (Signed)
Subjective:  Patient ID: Lauren Morris, female    DOB: [birth year]  Age: 84 y.o. MRN: [id number]  CC: The primary encounter diagnosis was Diabetes mellitus without complication ([place]). Diagnoses of Fatty liver disease, nonalcoholic, Acquired hypothyroidism, Encounter for immunization, and Essential hypertension were also pertinent to this visit.  HPI Lauren Morris presents for 6 month follow up on diabetes, fatty liver , hyperlipidemia (untreated),  And hypothyroidism.  Patient has no complaints today.  Patient is following a low glycemic index diet and taking all prescribed medications regularly without side effects.  Fasting sugars have been less than 140 most of the time and post prandials have been under 160 except on rare occasions. Patient is walking at the mall about 3 times per week and intentionally trying to lose weight .  Has lost 10  Lbs over the last year  .  Patient has two  Eye exams in December  and checks feet regularly for signs of infection.  Patient does not walk barefoot outside,  And denies an numbness tingling or burning in feet. Patient is up to date on all recommended vaccinations  Left knee pain improved but not resolved.  Aches when it rains . Had  One injection Dec 2018 by [facility]   Lab Results  Component Value Date   MICROALBUR 1.3 04/17/2018      Lab Results  Component Value Date   HGBA1C 5.9 (A) 04/17/2018     Outpatient Medications Prior to Visit  Medication Sig Dispense Refill  . amLODipine (NORVASC) 10 MG tablet TAKE ONE TABLET BY MOUTH EVERY DAY 30 tablet 3  . Calcium Carb-Cholecalciferol (CALCIUM 1000 + D PO) Take 1 tablet by mouth daily.      . Cholecalciferol (VITAMIN D) 1000 UNITS capsule Take 1,000 Units by mouth daily.      . dorzolamide-timolol (COSOPT) 22.3-6.8 MG/ML ophthalmic solution dorzolamide 22.3 mg-timolol 6.8 mg/mL eye drops    . fish oil-omega-3 fatty acids 1000 MG capsule Take 2 g by mouth daily.      . furosemide (LASIX) 20 MG tablet  TAKE ONE TABLET BY MOUTH DAILY AS NEEDEDFOR FLUID RETENTION 30 tablet 3  . levothyroxine (SYNTHROID, LEVOTHROID) 25 MCG tablet TAKE ONE TABLET BY MOUTH EVERY DAY 30 tablet 8  . predniSONE (DELTASONE) 10 MG tablet Take 4 tablets once daily for 2 days, 3 tabs daily for 2 days, 2 tabs daily for 2 days, 1 tab daily for 2 days. (Patient not taking: Reported on 04/17/2018) 20 tablet 0   No facility-administered medications prior to visit.     Review of Systems;  Patient denies headache, fevers, malaise, unintentional weight loss, skin rash, eye pain, sinus congestion and sinus pain, sore throat, dysphagia,  hemoptysis , cough, dyspnea, wheezing, chest pain, palpitations, orthopnea, edema, abdominal pain, nausea, melena, diarrhea, constipation, flank pain, dysuria, hematuria, urinary  Frequency, nocturia, numbness, tingling, seizures,  Focal weakness, Loss of consciousness,  Tremor, insomnia, depression, anxiety, and suicidal ideation.      Objective:  BP 128/70 (BP Location: Left Arm, Patient Position: Sitting, Cuff Size: Normal)   Pulse 72   Temp 97.7 F (36.5 C) (Oral)   Resp 15   Ht 5\' 2"  (1.575 m)   Wt 181 lb 3.2 oz (82.2 kg)   SpO2 95%   BMI 33.14 kg/m   BP Readings from Last 3 Encounters:  04/17/18 128/70  10/16/17 116/70  05/20/17 134/64    Wt Readings from Last 3 Encounters:  04/17/18 181 lb 3.2  oz (82.2 kg)  10/16/17 187 lb (84.8 kg)  05/20/17 191 lb 3.2 oz (86.7 kg)    General appearance: alert, cooperative and appears stated age Ears: normal TM's and external ear canals both ears Throat: lips, mucosa, and tongue normal; teeth and gums normal Neck: no adenopathy, no carotid bruit, supple, symmetrical, trachea midline and thyroid not enlarged, symmetric, no tenderness/mass/nodules Back: symmetric, no curvature. ROM normal. No CVA tenderness. Lungs: clear to auscultation bilaterally Heart: regular rate and rhythm, S1, S2 normal, no murmur, click, rub or gallop Abdomen:  soft, non-tender; bowel sounds normal; no masses,  no organomegaly Pulses: 2+ and symmetric Skin: Skin color, texture, turgor normal. No rashes or lesions Lymph nodes: Cervical, supraclavicular, and axillary nodes normal.  Lab Results  Component Value Date   HGBA1C 5.9 (A) 04/17/2018   HGBA1C 6.4 10/16/2017   HGBA1C 6.5 04/08/2017    Lab Results  Component Value Date   CREATININE 0.80 04/17/2018   CREATININE 0.75 10/16/2017   CREATININE 0.81 04/08/2017    Lab Results  Component Value Date   WBC 7.3 04/04/2016   HGB 15.7 (H) 04/04/2016   HCT 46.7 (H) 04/04/2016   PLT 252.0 04/04/2016   GLUCOSE 119 (H) 04/17/2018   CHOL 147 10/16/2017   TRIG 142.0 10/16/2017   HDL 35.40 (L) 10/16/2017   LDLDIRECT 80.0 04/08/2017   LDLCALC 83 10/16/2017   ALT 29 04/17/2018   AST 33 04/17/2018   NA 137 04/17/2018   K 4.3 04/17/2018   CL 103 04/17/2018   CREATININE 0.80 04/17/2018   BUN 12 04/17/2018   CO2 25 04/17/2018   TSH 2.49 04/17/2018   HGBA1C 5.9 (A) 04/17/2018   MICROALBUR 1.3 04/17/2018    Ct Chest Wo Contrast  Result Date: 05/22/2016 CLINICAL DATA:  Shortness of breath with activity and cough EXAM: CT CHEST WITHOUT CONTRAST TECHNIQUE: Multidetector CT imaging of the chest was performed following the standard protocol without IV contrast. COMPARISON:  05/08/2016, 07/23/2008 FINDINGS: Cardiovascular: Somewhat limited due the lack of IV contrast. Calcific changes are noted within the thoracic aorta without aneurysmal dilatation. Mild coronary calcifications are seen. Cardiac structures are not significantly enlarged. Mediastinum/Nodes: No significant hilar or mediastinal adenopathy is noted. The thoracic inlet is within normal limits. No axillary adenopathy is seen. Lungs/Pleura: Some patchy mosaic attenuation is noted throughout both lungs but relatively stable from 2010. No focal confluent infiltrate is seen although some mild atelectatic changes are noted in the left lower lobe  anteriorly as well as the right middle lobe. No sizable effusion is seen. No parenchymal nodules are noted. Upper Abdomen: No acute abnormality. Musculoskeletal: Degenerative changes of the thoracic spine are noted. No acute bony abnormality is seen. IMPRESSION: Bibasilar atelectatic changes as described. No focal confluent infiltrate is seen. Chronic patchy mosaic attenuation throughout both lungs. Stable from the previous exam Electronically Signed   By: Inez Catalina M.D.   On: 05/22/2016 11:17    Assessment & Plan:   Problem List Items Addressed This Visit    Acquired hypothyroidism   Relevant Orders   TSH (Completed)   Diabetes mellitus without complication ([place]) - Primary    Improving control with low GI diet and exercise .  hemoglobin A1c is <6.0. Patient is  Up to date on   eye exam and foot exam was done today.  There is  no proteinuria on prior micro urinalysis . She is opposed to statin use    Lab Results  Component Value Date   HGBA1C  5.9 (A) 04/17/2018   Lab Results  Component Value Date   MICROALBUR 1.3 04/17/2018   Lab Results  Component Value Date   CREATININE 0.80 04/17/2018         Relevant Orders   POCT HgB A1C (Completed)   Microalbumin / creatinine urine ratio (Completed)   Essential hypertension    Well controlled on current regimen. Renal function stable, no changes today. Lab Results  Component Value Date   CREATININE 0.80 04/17/2018   Lab Results  Component Value Date   NA 137 04/17/2018   K 4.3 04/17/2018   CL 103 04/17/2018   CO2 25 04/17/2018         Fatty liver disease, nonalcoholic   Relevant Orders   Comprehensive metabolic panel (Completed)    Other Visit Diagnoses    Encounter for immunization       Relevant Orders   Flu vaccine HIGH DOSE PF (Completed)    A total of 25 minutes of face to face time was spent with patient more than half of which was spent in counselling about the above mentioned conditions  and coordination of care    I have discontinued Lauren Morris predniSONE. I am also having her start on Zoster Vaccine Adjuvanted. Additionally, I am having her maintain her Calcium Carb-Cholecalciferol (CALCIUM 1000 + D PO), Vitamin D, fish oil-omega-3 fatty acids, furosemide, dorzolamide-timolol, levothyroxine, and amLODipine.  Meds ordered this encounter  Medications  . Zoster Vaccine Adjuvanted [facility]) injection    Sig: Inject 0.5 mLs into the muscle once for 1 dose.    Dispense:  1 each    Refill:  1    Medications Discontinued During This Encounter  Medication Reason  . predniSONE (DELTASONE) 10 MG tablet     Follow-up: Return in about 6 months (around 10/17/2018) for CPE.   [name], MD

## 2018-04-17 NOTE — Patient Instructions (Addendum)
WELL DONE!  YOU HAVE LOWERED YOUR A1C TO 5.9 WITH DIET ALONE  YOU HAD YOUR FLU VACCINE TODAY   I ENCOURAGE YOU TO WALK AS MANY DAYS AS POSSIBLE FOR 30 MINUTES   YOU NEED ONE MORE PNEUMONIA VACCINE TO BE COVERED , AND YOU CAN GET  THIS NEXT WEEK OR NEXT VISIT   The ShingRx vaccine is now available in local pharmacies and is much more protective thant Zostavaxs,  It is therefore ADVISED for all interested adults over 50 to prevent shingles    SEE YOU IN 6 MONTHS

## 2018-04-18 ENCOUNTER — Encounter: Payer: Self-pay | Admitting: *Deleted

## 2018-04-19 NOTE — Assessment & Plan Note (Signed)
Well controlled on current regimen. Renal function stable, no changes today. Lab Results  Component Value Date   CREATININE 0.80 04/17/2018   Lab Results  Component Value Date   NA 137 04/17/2018   K 4.3 04/17/2018   CL 103 04/17/2018   CO2 25 04/17/2018

## 2018-04-19 NOTE — Assessment & Plan Note (Signed)
Improving control with low GI diet and exercise .  hemoglobin A1c is <6.0. Patient is  Up to date on   eye exam and foot exam was done today.  There is  no proteinuria on prior micro urinalysis . She is opposed to statin use    Lab Results  Component Value Date   HGBA1C 5.9 (A) 04/17/2018   Lab Results  Component Value Date   MICROALBUR 1.3 04/17/2018   Lab Results  Component Value Date   CREATININE 0.80 04/17/2018

## 2018-05-06 DIAGNOSIS — J069 Acute upper respiratory infection, unspecified: Secondary | ICD-10-CM | POA: Diagnosis not present

## 2018-05-06 DIAGNOSIS — J209 Acute bronchitis, unspecified: Secondary | ICD-10-CM | POA: Diagnosis not present

## 2018-05-08 ENCOUNTER — Encounter: Payer: Self-pay | Admitting: Internal Medicine

## 2018-05-08 ENCOUNTER — Ambulatory Visit (INDEPENDENT_AMBULATORY_CARE_PROVIDER_SITE_OTHER): Payer: Medicare Other

## 2018-05-08 ENCOUNTER — Ambulatory Visit (INDEPENDENT_AMBULATORY_CARE_PROVIDER_SITE_OTHER): Payer: Medicare Other | Admitting: Internal Medicine

## 2018-05-08 VITALS — BP 142/68 | HR 80 | Temp 97.7°F | Resp 16 | Ht 62.0 in | Wt 182.2 lb

## 2018-05-08 DIAGNOSIS — R058 Other specified cough: Secondary | ICD-10-CM

## 2018-05-08 DIAGNOSIS — R05 Cough: Secondary | ICD-10-CM | POA: Diagnosis not present

## 2018-05-08 DIAGNOSIS — J209 Acute bronchitis, unspecified: Secondary | ICD-10-CM | POA: Diagnosis not present

## 2018-05-08 LAB — CBC WITH DIFFERENTIAL/PLATELET
BASOS PCT: 0.7 % (ref 0.0–3.0)
Basophils Absolute: 0.1 10*3/uL (ref 0.0–0.1)
EOS PCT: 5.6 % — AB (ref 0.0–5.0)
Eosinophils Absolute: 0.5 10*3/uL (ref 0.0–0.7)
HCT: 47.5 % — ABNORMAL HIGH (ref 36.0–46.0)
Hemoglobin: 15.9 g/dL — ABNORMAL HIGH (ref 12.0–15.0)
LYMPHS ABS: 2.9 10*3/uL (ref 0.7–4.0)
Lymphocytes Relative: 29.8 % (ref 12.0–46.0)
MCHC: 33.4 g/dL (ref 30.0–36.0)
MCV: 87.6 fl (ref 78.0–100.0)
MONO ABS: 0.8 10*3/uL (ref 0.1–1.0)
Monocytes Relative: 8.2 % (ref 3.0–12.0)
NEUTROS PCT: 55.7 % (ref 43.0–77.0)
Neutro Abs: 5.3 10*3/uL (ref 1.4–7.7)
PLATELETS: 296 10*3/uL (ref 150.0–400.0)
RBC: 5.42 Mil/uL — ABNORMAL HIGH (ref 3.87–5.11)
RDW: 13.4 % (ref 11.5–15.5)
WBC: 9.6 10*3/uL (ref 4.0–10.5)

## 2018-05-08 MED ORDER — METHYLPREDNISOLONE ACETATE 40 MG/ML IJ SUSP
40.0000 mg | Freq: Once | INTRAMUSCULAR | Status: AC
  Administered 2018-05-08: 40 mg via INTRAMUSCULAR

## 2018-05-08 MED ORDER — PREDNISONE 10 MG PO TABS: ORAL_TABLET | ORAL | 0 refills | Status: DC

## 2018-05-08 NOTE — Progress Notes (Signed)
Subjective:  Patient ID: Carollee Sires, female    DOB: [birth year]  Age: 84 y.o. MRN: [id number]  CC: The primary encounter diagnosis was Cough productive of purulent sputum. A diagnosis of Acute bronchitis, unspecified organism was also pertinent to this visit.  HPI PASTY MANNINEN presents for follow up on bronchitis .  Spent a few minutes out in her yard last Thursday. Developed rhinitis,  Sneezing that night,  followed by chest tightness and congestion .  Did not take any OTC meds , went to [facility] on Nov 12  when symptoms did not improve  After several days.  Was coughing, had  purulent sputum ,  But denied fever and body aches.  Seen on  On Nov 12  Taking doxycycline with food and some other medication that was $200 so she didn't get it.   An alternative was prescribed and Was  told to follow up with me in 24 hours!  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 amLODipine (NORVASC) 10 MG tablet TAKE ONE TABLET BY MOUTH EVERY DAY 30 tablet 3  . Calcium Carb-Cholecalciferol (CALCIUM 1000 + D PO) Take 1 tablet by mouth daily.      . Cholecalciferol (VITAMIN D) 1000 UNITS capsule Take 1,000 Units by mouth daily.      . dorzolamide-timolol (COSOPT) 22.3-6.8 MG/ML ophthalmic solution dorzolamide 22.3 mg-timolol 6.8 mg/mL eye drops    . doxycycline (VIBRAMYCIN) 100 MG capsule Take 100 mg by mouth 2 (two) times daily.    . fish oil-omega-3 fatty acids 1000 MG capsule Take 2 g by mouth daily.      . furosemide (LASIX) 20 MG tablet TAKE ONE TABLET BY MOUTH DAILY AS NEEDEDFOR FLUID RETENTION 30 tablet 3  . levothyroxine (SYNTHROID, LEVOTHROID) 25 MCG tablet TAKE ONE TABLET BY MOUTH EVERY DAY 30 tablet 8   No facility-administered medications prior to visit.     Review of Systems;  Patient  denies headache, fevers, malaise, unintentional weight loss, skin rash, eye pain, sinus congestion and sinus pain, sore throat, dysphagia,  hemoptysis , cough, dyspnea, wheezing, chest pain, palpitations, orthopnea, edema, abdominal pain, nausea, melena, diarrhea, constipation, flank pain, dysuria, hematuria, urinary  Frequency, nocturia, numbness, tingling, seizures,  Focal weakness, Loss of consciousness,  Tremor, insomnia, depression, anxiety, and suicidal ideation.      Objective:  BP (!) 142/68 (BP Location: Left Arm, Patient Position: Sitting, Cuff Size: Normal)   Pulse 80   Temp 97.7 F (36.5 C) (Oral)   Resp 16   Ht 5\' 2"  (1.575 m)   Wt 182 lb 3.2 oz (82.6 kg)   SpO2 92%   BMI 33.32 kg/m   BP Readings from Last 3 Encounters:  05/09/18 140/70  05/08/18 (!) 142/68  04/17/18 128/70    Wt Readings from Last 3 Encounters:  05/09/18 180 lb 6.4 oz (81.8 kg)  05/08/18 182 lb 3.2 oz (82.6 kg)  04/17/18 181 lb 3.2 oz (82.2 kg)    General appearance: alert, cooperative and appears stated age Ears: normal TM's and external ear canals both ears Throat: lips, mucosa, and tongue normal; teeth and gums normal Neck: no adenopathy, no carotid bruit, supple, symmetrical, trachea midline and thyroid not enlarged, symmetric, no tenderness/mass/nodules Back: symmetric, no curvature. ROM normal. No CVA tenderness. Lungs:  lower lobe crackles bilaterally,  Good air movement  Heart: regular rate and rhythm, S1, S2 normal, no murmur, click, rub or gallop Abdomen: soft, non-tender; bowel sounds normal; no masses,  no organomegaly Pulses: 2+ and symmetric Skin: Skin color, texture, turgor normal. No rashes or lesions Lymph nodes: Cervical, supraclavicular, and axillary nodes normal.  Lab Results  Component Value Date   HGBA1C 5.9 (A) 04/17/2018   HGBA1C 6.4 10/16/2017   HGBA1C 6.5 04/08/2017    Lab Results  Component Value Date   CREATININE 0.80 04/17/2018   CREATININE 0.75 10/16/2017    CREATININE 0.81 04/08/2017    Lab Results  Component Value Date   WBC 9.6 05/08/2018   HGB 15.9 (H) 05/08/2018   HCT 47.5 (H) 05/08/2018   PLT 296.0 05/08/2018   GLUCOSE 119 (H) 04/17/2018   CHOL 147 10/16/2017   TRIG 142.0 10/16/2017   HDL 35.40 (L) 10/16/2017   LDLDIRECT 80.0 04/08/2017   LDLCALC 83 10/16/2017   ALT 29 04/17/2018   AST 33 04/17/2018   NA 137 04/17/2018   K 4.3 04/17/2018   CL 103 04/17/2018   CREATININE 0.80 04/17/2018   BUN 12 04/17/2018   CO2 25 04/17/2018   TSH 2.49 04/17/2018   HGBA1C 5.9 (A) 04/17/2018   MICROALBUR 1.3 04/17/2018    Ct Chest Wo Contrast  Result Date: 05/22/2016 CLINICAL DATA:  Shortness of breath with activity and cough EXAM: CT CHEST WITHOUT CONTRAST TECHNIQUE: Multidetector CT imaging of the chest was performed following the standard protocol without IV contrast. COMPARISON:  05/08/2016, 07/23/2008 FINDINGS: Cardiovascular: Somewhat limited due the lack of IV contrast. Calcific changes are noted within the thoracic aorta without aneurysmal dilatation. Mild coronary calcifications are seen. Cardiac structures are not significantly enlarged. Mediastinum/Nodes: No significant hilar or mediastinal adenopathy is noted. The thoracic inlet is within normal limits. No axillary adenopathy is seen. Lungs/Pleura: Some patchy mosaic attenuation is noted throughout both lungs but relatively stable from 2010. No focal confluent infiltrate is seen although some mild atelectatic changes are noted in the left lower lobe anteriorly as well as the right middle lobe. No sizable effusion is seen. No parenchymal nodules are noted. Upper Abdomen: No acute abnormality. Musculoskeletal: Degenerative changes of the thoracic spine are noted. No acute bony abnormality is seen. IMPRESSION: Bibasilar atelectatic changes as described. No focal confluent infiltrate is seen. Chronic patchy mosaic attenuation throughout both lungs. Stable from the previous exam Electronically  Signed   By: Inez Catalina M.D.   On: 05/22/2016 11:17    Assessment & Plan:   Problem List Items Addressed This Visit    Bronchitis, acute    Chest x ray is without infiltrates today and CBC is normal.  Continue doxycycline,  Adding steroids for chest tightness   Lab Results  Component Value Date   WBC 9.6 05/08/2018   HGB 15.9 (H) 05/08/2018   HCT 47.5 (H) 05/08/2018   MCV 87.6 05/08/2018   PLT 296.0 05/08/2018          Other Visit Diagnoses    Cough productive of purulent sputum    -  Primary   Relevant Medications   methylPREDNISolone acetate (DEPO-MEDROL) injection 40 mg (Completed)   Other Relevant Orders   DG Chest 2 View (Completed)   CBC with Differential/Platelet (Completed)      I am having Majesta M. Rugg start on predniSONE. I am also having her maintain her Calcium Carb-Cholecalciferol (CALCIUM 1000 + D PO), Vitamin D, fish oil-omega-3 fatty acids,  furosemide, dorzolamide-timolol, levothyroxine, amLODipine, and doxycycline. We administered methylPREDNISolone acetate.  Meds ordered this encounter  Medications  . predniSONE (DELTASONE) 10 MG tablet    Sig: 6 tablets on Day 1 , then reduce by 1 tablet daily until gone    Dispense:  21 tablet    Refill:  0    DO NOT USE A PILL PACK  TOO HARD TO OPEN  . methylPREDNISolone acetate (DEPO-MEDROL) injection 40 mg    There are no discontinued medications.  Follow-up: Return in about 1 week (around 05/15/2018).   [name], MD

## 2018-05-08 NOTE — Patient Instructions (Addendum)
Continue the doxycycline  Twice daily with food   You can use robitussin for cough (THIS IS AVAILABLE OTC)    I am adding a prednisone taper that you will start TOMORROW MORNING:  6 tablets all at once on Day 1,  Then taper by 1 tablet daily until gone

## 2018-05-09 ENCOUNTER — Ambulatory Visit: Payer: Self-pay

## 2018-05-09 ENCOUNTER — Ambulatory Visit (INDEPENDENT_AMBULATORY_CARE_PROVIDER_SITE_OTHER): Payer: Medicare Other | Admitting: Family Medicine

## 2018-05-09 ENCOUNTER — Encounter: Payer: Self-pay | Admitting: Family Medicine

## 2018-05-09 VITALS — BP 140/70 | HR 90 | Temp 97.4°F | Ht 62.0 in | Wt 180.4 lb

## 2018-05-09 DIAGNOSIS — R05 Cough: Secondary | ICD-10-CM

## 2018-05-09 DIAGNOSIS — R350 Frequency of micturition: Secondary | ICD-10-CM

## 2018-05-09 DIAGNOSIS — R058 Other specified cough: Secondary | ICD-10-CM

## 2018-05-09 LAB — POCT URINALYSIS DIPSTICK
Bilirubin, UA: NEGATIVE
Glucose, UA: NEGATIVE
KETONES UA: NEGATIVE
Leukocytes, UA: NEGATIVE
NITRITE UA: NEGATIVE
PH UA: 6 (ref 5.0–8.0)
PROTEIN UA: NEGATIVE
RBC UA: NEGATIVE
Spec Grav, UA: 1.015 (ref 1.010–1.025)
UROBILINOGEN UA: 0.2 U/dL

## 2018-05-09 LAB — URINALYSIS, MICROSCOPIC ONLY: RBC / HPF: NONE SEEN (ref 0–?)

## 2018-05-09 NOTE — Telephone Encounter (Signed)
Please advise 

## 2018-05-09 NOTE — Telephone Encounter (Signed)
Pt. Reports she started having urinary frequency since starting the Doxycycline. Says she forgot to mention it to Dr.Tullo. States she got up 12-15 times last night.No pain or burning. Also afraid to take her prednisone - "I have never taken it before." Instructed pt. It will help with cough and breathing. Verbalizes understanding. Please advise pt. In regard to urinary frequency.  Answer Assessment - Initial Assessment Questions 1. SYMPTOM: "What's the main symptom you're concerned about?" (e.g., frequency, incontinence)     Frequency only 2. ONSET: "When did the  frequency  start?"     Started started Doxycycline 3. PAIN: "Is there any pain?" If so, ask: "How bad is it?" (Scale: 1-10; mild, moderate, severe)     No pain 4. CAUSE: "What do you think is causing the symptoms?"     Unsure 5. OTHER SYMPTOMS: "Do you have any other symptoms?" (e.g., fever, flank pain, blood in urine, pain with urination)     No 6. PREGNANCY: "Is there any chance you are pregnant?" "When was your last menstrual period?"     No  Protocols used: URINARY [facility]

## 2018-05-09 NOTE — Patient Instructions (Signed)

## 2018-05-09 NOTE — Telephone Encounter (Signed)
Pt has been scheduled to see you today at 10:40am for urinary frequency.

## 2018-05-09 NOTE — Progress Notes (Signed)
Subjective:    Patient ID: Lauren Morris, female    DOB: Aug 31, 1931, 84 y.o.   MRN: [id number]  HPI  Presents to clinic c/o increased urinary frequency for 2 days.  Patient believes the urinary frequency that began after she started taking doxycycline for productive cough.  She also was started on prednisone course yesterday and given shot of prednisone in office.  Patient states she was up multiple times throughout the night last night running to the bathroom to urinate.  Denies any burning with urination, denies any lower abdominal pain.  Denies any nausea, vomiting or diarrhea.   Patient does drink coffee every day, but it is decaffeinated.  She also works hard to keep up good water intake.  Patient also reports her cough is feeling better, is not bringing up as much phlegm.  She would like to know the results of her lab work and chest x-ray that was done yesterday.  CBC reviewed: CBC Latest Ref Rng & Units 05/08/2018 04/04/2016 10/29/2013  WBC 4.0 - 10.5 K/uL 9.6 7.3 7.8  Hemoglobin 12.0 - 15.0 g/dL 15.9(H) 15.7(H) 15.3(H)  Hematocrit 36.0 - 46.0 % 47.5(H) 46.7(H) 45.3  Platelets 150.0 - 400.0 K/uL 296.0 252.0 251.0   CXR 05/08/18 results reviewed: FINDINGS: Stable cardiomegaly. The hila and mediastinum are unremarkable. Prominent interstitial markings in the lungs are stable. No focal infiltrate identified.  IMPRESSION: No active cardiopulmonary disease.  Patient Active Problem List   Diagnosis Date Noted  . Abnormal chest CT 05/25/2016  . Fatty liver disease, nonalcoholic 40/34/7425  . Obesity 11/13/2012  . Diabetes mellitus without complication ([place]) 95/63/8756  . Medicare annual wellness visit, subsequent 03/11/2012  . Hyperlipidemia LDL goal <100 04/15/2011  . Essential hypertension 02/22/2011  . Acquired hypothyroidism 02/22/2011   Social History   Tobacco Use  . Smoking status: Never Smoker  . Smokeless tobacco: Never Used  Substance Use Topics  . Alcohol use:  No   Review of Systems  Constitutional: Negative for chills, fatigue and fever.  HENT: Negative for congestion, ear pain, sinus pain and sore throat.   Eyes: Negative.   Respiratory: +cough, but is improving. Negative for shortness of breath and wheezing.   Cardiovascular: Negative for chest pain, palpitations and leg swelling.  Gastrointestinal: Negative for abdominal pain, diarrhea, nausea and vomiting.  Genitourinary: + frequency and urgency.  Musculoskeletal: Negative for arthralgias and myalgias.  Skin: Negative for color change, pallor and rash.  Neurological: Negative for syncope, light-headedness and headaches.  Psychiatric/Behavioral: The patient is not nervous/anxious.       Objective:   Physical Exam  Constitutional: She is oriented to person, place, and time. She appears well-nourished. No distress.  HENT:  Head: Normocephalic and atraumatic.  Eyes: Conjunctivae and EOM are normal. No scleral icterus.  Neck: Neck supple. No tracheal deviation present.  Cardiovascular: Normal rate and regular rhythm.  Pulmonary/Chest: Effort normal. No respiratory distress. She has no wheezes. She has no rales.  Abdominal: Soft. Bowel sounds are normal. She exhibits no distension. There is no tenderness.  Musculoskeletal: She exhibits no edema.  Gait normal  Neurological: She is alert and oriented to person, place, and time.  Skin: Skin is warm and dry. No pallor.  Psychiatric: She has a normal mood and affect. Her behavior is normal.  Nursing note and vitals reviewed.  Urinalysis    Component Value Date/Time   BILIRUBINUR neg 05/09/2018 1105   PROTEINUR Negative 05/09/2018 1105   UROBILINOGEN 0.2 05/09/2018 1105   NITRITE  neg 05/09/2018 1105   LEUKOCYTESUR Negative 05/09/2018 1105      Vitals:   05/09/18 1040  BP: 140/70  Pulse: 90  Temp: (!) 97.4 F (36.3 C)  SpO2: 92%   Assessment & Plan:   Increased urinary frequency - urinalysis done in clinic is unremarkable for  any abnormality.  We will also send urine to lab for microscopic evaluation and urine culture.  The increased frequency began at the same time patient was started on doxycycline and prednisone, it is possible that these medications could be contributing factor due to them being the most recent change.  Patient does not drink excessive caffeine or sugary type beverages.  Advised patient to continue doxycycline and prednisone as prescribed.  If urinary frequency persist even after these medications are finished, it could be related to overactive bladder.  Patient aware that we will contact her with results of microscopic urinalysis and also urine culture.  Patient advised to be sure to keep up good water intake.  Cough productive of purulent sputum - patient's chest x-ray reviewed, CBC reviewed and results are stable.  Patient also reports she is feeling better today and cough phlegm production is less.  Again advised to finish doxycycline and prednisone as prescribed.   Patient will follow-up next week as planned for recheck of cough.  Patient will also let us know if urinary frequency is persisting.

## 2018-05-10 DIAGNOSIS — J440 Chronic obstructive pulmonary disease with acute lower respiratory infection: Secondary | ICD-10-CM

## 2018-05-10 DIAGNOSIS — J209 Acute bronchitis, unspecified: Secondary | ICD-10-CM | POA: Insufficient documentation

## 2018-05-10 LAB — URINE CULTURE
MICRO NUMBER:: 91378770
RESULT: NO GROWTH
SPECIMEN QUALITY:: ADEQUATE

## 2018-05-10 NOTE — Assessment & Plan Note (Signed)
Chest x ray is without infiltrates today and CBC is normal.  Continue doxycycline,  Adding steroids for chest tightness   Lab Results  Component Value Date   WBC 9.6 05/08/2018   HGB 15.9 (H) 05/08/2018   HCT 47.5 (H) 05/08/2018   MCV 87.6 05/08/2018   PLT 296.0 05/08/2018

## 2018-05-15 ENCOUNTER — Ambulatory Visit (INDEPENDENT_AMBULATORY_CARE_PROVIDER_SITE_OTHER): Payer: Medicare Other | Admitting: Internal Medicine

## 2018-05-15 ENCOUNTER — Encounter: Payer: Self-pay | Admitting: Internal Medicine

## 2018-05-15 DIAGNOSIS — J209 Acute bronchitis, unspecified: Secondary | ICD-10-CM

## 2018-05-15 NOTE — Progress Notes (Signed)
Subjective:  Patient ID: Lauren Morris, female    DOB: 04-30-1932  Age: 84 y.o. MRN: [id number]  CC: The encounter diagnosis was Acute bronchitis, unspecified organism.  HPI Lauren Morris presents for one week follow up on bronchitis .she was seen one week ago and teated with doxycycline and prednisone.  Her   Chest x ray  Was normal    She returns today in an improved state.  Her Cough has resolved.   She has not used any suppressants in 2 days . Reporting some new onset  Nocturia x  3 due to increased intake of water and use of prednisone  .  She denies any episodes of loose stools and is taking probiotic therapy    Outpatient Medications Prior to Visit  Medication Sig Dispense Refill  . amLODipine (NORVASC) 10 MG tablet TAKE ONE TABLET BY MOUTH EVERY DAY 30 tablet 3  . Calcium Carb-Cholecalciferol (CALCIUM 1000 + D PO) Take 1 tablet by mouth daily.      . Cholecalciferol (VITAMIN D) 1000 UNITS capsule Take 1,000 Units by mouth daily.      . dorzolamide-timolol (COSOPT) 22.3-6.8 MG/ML ophthalmic solution dorzolamide 22.3 mg-timolol 6.8 mg/mL eye drops    . doxycycline (VIBRAMYCIN) 100 MG capsule Take 100 mg by mouth 2 (two) times daily.    . fish oil-omega-3 fatty acids 1000 MG capsule Take 2 g by mouth daily.      . furosemide (LASIX) 20 MG tablet TAKE ONE TABLET BY MOUTH DAILY AS NEEDEDFOR FLUID RETENTION 30 tablet 3  . levothyroxine (SYNTHROID, LEVOTHROID) 25 MCG tablet TAKE ONE TABLET BY MOUTH EVERY DAY 30 tablet 8  . predniSONE (DELTASONE) 10 MG tablet 6 tablets on Day 1 , then reduce by 1 tablet daily until gone (Patient not taking: Reported on 05/15/2018) 21 tablet 0   No facility-administered medications prior to visit.     Review of Systems;  Patient denies headache, fevers, malaise, unintentional weight loss, skin rash, eye pain, sinus congestion and sinus pain, sore throat, dysphagia,  hemoptysis , , dyspnea, wheezing, chest pain, palpitations, orthopnea, edema, abdominal  pain, nausea, melena, diarrhea, constipation, flank pain, dysuria, hematuria, urinary  Frequency, nocturia, numbness, tingling, seizures,  Focal weakness, Loss of consciousness,  Tremor, insomnia, depression, anxiety, and suicidal ideation.      Objective:  BP 140/70 (BP Location: Left Arm, Patient Position: Sitting, Cuff Size: Large)   Pulse 72   Temp 98 F (36.7 C) (Oral)   Resp 15   Wt 177 lb 8 oz (80.5 kg)   SpO2 95%   BMI 32.47 kg/m   BP Readings from Last 3 Encounters:  05/15/18 140/70  05/09/18 140/70  05/08/18 (!) 142/68    Wt Readings from Last 3 Encounters:  05/15/18 177 lb 8 oz (80.5 kg)  05/09/18 180 lb 6.4 oz (81.8 kg)  05/08/18 182 lb 3.2 oz (82.6 kg)    General appearance: alert, cooperative and appears stated age Ears: normal TM's and external ear canals both ears Throat: lips, mucosa, and tongue normal; teeth and gums normal Neck: no adenopathy, no carotid bruit, supple, symmetrical, trachea midline and thyroid not enlarged, symmetric, no tenderness/mass/nodules Back: symmetric, no curvature. ROM normal. No CVA tenderness. Lungs: clear to auscultation bilaterally Heart: regular rate and rhythm, S1, S2 normal, no murmur, click, rub or gallop Abdomen: soft, non-tender; bowel sounds normal; no masses,  no organomegaly Pulses: 2+ and symmetric Skin: Skin color, texture, turgor normal. No rashes or lesions Lymph nodes:  Cervical, supraclavicular, and axillary nodes normal.  Lab Results  Component Value Date   HGBA1C 5.9 (A) 04/17/2018   HGBA1C 6.4 10/16/2017   HGBA1C 6.5 04/08/2017    Lab Results  Component Value Date   CREATININE 0.80 04/17/2018   CREATININE 0.75 10/16/2017   CREATININE 0.81 04/08/2017    Lab Results  Component Value Date   WBC 9.6 05/08/2018   HGB 15.9 (H) 05/08/2018   HCT 47.5 (H) 05/08/2018   PLT 296.0 05/08/2018   GLUCOSE 119 (H) 04/17/2018   CHOL 147 10/16/2017   TRIG 142.0 10/16/2017   HDL 35.40 (L) 10/16/2017    LDLDIRECT 80.0 04/08/2017   LDLCALC 83 10/16/2017   ALT 29 04/17/2018   AST 33 04/17/2018   NA 137 04/17/2018   K 4.3 04/17/2018   CL 103 04/17/2018   CREATININE 0.80 04/17/2018   BUN 12 04/17/2018   CO2 25 04/17/2018   TSH 2.49 04/17/2018   HGBA1C 5.9 (A) 04/17/2018   MICROALBUR 1.3 04/17/2018    Ct Chest Wo Contrast  Result Date: 05/22/2016 CLINICAL DATA:  Shortness of breath with activity and cough EXAM: CT CHEST WITHOUT CONTRAST TECHNIQUE: Multidetector CT imaging of the chest was performed following the standard protocol without IV contrast. COMPARISON:  05/08/2016, 07/23/2008 FINDINGS: Cardiovascular: Somewhat limited due the lack of IV contrast. Calcific changes are noted within the thoracic aorta without aneurysmal dilatation. Mild coronary calcifications are seen. Cardiac structures are not significantly enlarged. Mediastinum/Nodes: No significant hilar or mediastinal adenopathy is noted. The thoracic inlet is within normal limits. No axillary adenopathy is seen. Lungs/Pleura: Some patchy mosaic attenuation is noted throughout both lungs but relatively stable from 2010. No focal confluent infiltrate is seen although some mild atelectatic changes are noted in the left lower lobe anteriorly as well as the right middle lobe. No sizable effusion is seen. No parenchymal nodules are noted. Upper Abdomen: No acute abnormality. Musculoskeletal: Degenerative changes of the thoracic spine are noted. No acute bony abnormality is seen. IMPRESSION: Bibasilar atelectatic changes as described. No focal confluent infiltrate is seen. Chronic patchy mosaic attenuation throughout both lungs. Stable from the previous exam Electronically Signed   By: Inez Catalina M.D.   On: 05/22/2016 11:17    Assessment & Plan:   Problem List Items Addressed This Visit    Bronchitis, acute    Improved s/p empiric treatment with doxy and prednisone.  Chest x ray was normal.  Patient given advice on preventing subsequent  illnesses with prophylactic use of antibacterial  hand lotion  ,  Saline irrigation of sinuses upon coming home,   and avoidance of crowds.          I have discontinued Vivianne Spence. [place]'s predniSONE. I am also having her maintain her Calcium Carb-Cholecalciferol (CALCIUM 1000 + D PO), Vitamin D, fish oil-omega-3 fatty acids, furosemide, dorzolamide-timolol, levothyroxine, amLODipine, and doxycycline.  No orders of the defined types were placed in this encounter.   Medications Discontinued During This Encounter  Medication Reason  . predniSONE (DELTASONE) 10 MG tablet Completed Course    Follow-up: No follow-ups on file.   [name], MD

## 2018-05-17 NOTE — Assessment & Plan Note (Addendum)
Improved s/p empiric treatment with doxy and prednisone.  Chest x ray was normal.  Patient given advice on preventing subsequent illnesses with prophylactic use of antibacterial  hand lotion  ,  Saline irrigation of sinuses upon coming home,   and avoidance of crowds.

## 2018-06-02 DIAGNOSIS — H401131 Primary open-angle glaucoma, bilateral, mild stage: Secondary | ICD-10-CM | POA: Diagnosis not present

## 2018-06-11 ENCOUNTER — Other Ambulatory Visit: Payer: Self-pay | Admitting: Internal Medicine

## 2018-06-11 DIAGNOSIS — H401131 Primary open-angle glaucoma, bilateral, mild stage: Secondary | ICD-10-CM | POA: Diagnosis not present

## 2018-06-11 DIAGNOSIS — H35341 Macular cyst, hole, or pseudohole, right eye: Secondary | ICD-10-CM | POA: Diagnosis not present

## 2018-06-11 DIAGNOSIS — H35373 Puckering of macula, bilateral: Secondary | ICD-10-CM | POA: Diagnosis not present

## 2018-06-13 ENCOUNTER — Ambulatory Visit (INDEPENDENT_AMBULATORY_CARE_PROVIDER_SITE_OTHER): Payer: Medicare Other | Admitting: Internal Medicine

## 2018-06-13 ENCOUNTER — Encounter: Payer: Self-pay | Admitting: Internal Medicine

## 2018-06-13 ENCOUNTER — Telehealth (HOSPITAL_COMMUNITY): Payer: Self-pay | Admitting: Family Medicine

## 2018-06-13 ENCOUNTER — Ambulatory Visit (INDEPENDENT_AMBULATORY_CARE_PROVIDER_SITE_OTHER): Payer: Medicare Other

## 2018-06-13 VITALS — BP 138/70 | HR 102 | Temp 98.6°F | Resp 17 | Ht 62.0 in | Wt 180.2 lb

## 2018-06-13 DIAGNOSIS — R05 Cough: Secondary | ICD-10-CM

## 2018-06-13 DIAGNOSIS — R9389 Abnormal findings on diagnostic imaging of other specified body structures: Secondary | ICD-10-CM

## 2018-06-13 DIAGNOSIS — J45901 Unspecified asthma with (acute) exacerbation: Secondary | ICD-10-CM

## 2018-06-13 DIAGNOSIS — J209 Acute bronchitis, unspecified: Secondary | ICD-10-CM

## 2018-06-13 DIAGNOSIS — D582 Other hemoglobinopathies: Secondary | ICD-10-CM

## 2018-06-13 DIAGNOSIS — R0902 Hypoxemia: Secondary | ICD-10-CM | POA: Diagnosis not present

## 2018-06-13 DIAGNOSIS — R059 Cough, unspecified: Secondary | ICD-10-CM

## 2018-06-13 DIAGNOSIS — J440 Chronic obstructive pulmonary disease with acute lower respiratory infection: Secondary | ICD-10-CM

## 2018-06-13 LAB — CBC WITH DIFFERENTIAL/PLATELET
ABSOLUTE MONOCYTES: 972 {cells}/uL — AB (ref 200–950)
BASOS ABS: 45 {cells}/uL (ref 0–200)
BASOS PCT: 0.4 %
EOS ABS: 362 {cells}/uL (ref 15–500)
Eosinophils Relative: 3.2 %
HCT: 48.4 % — ABNORMAL HIGH (ref 35.0–45.0)
HEMOGLOBIN: 16.5 g/dL — AB (ref 11.7–15.5)
Lymphs Abs: 2373 cells/uL (ref 850–3900)
MCH: 29.2 pg (ref 27.0–33.0)
MCHC: 34.1 g/dL (ref 32.0–36.0)
MCV: 85.5 fL (ref 80.0–100.0)
MPV: 12.3 fL (ref 7.5–12.5)
Monocytes Relative: 8.6 %
Neutro Abs: 7548 cells/uL (ref 1500–7800)
Neutrophils Relative %: 66.8 %
PLATELETS: 222 10*3/uL (ref 140–400)
RBC: 5.66 10*6/uL — ABNORMAL HIGH (ref 3.80–5.10)
RDW: 12.8 % (ref 11.0–15.0)
TOTAL LYMPHOCYTE: 21 %
WBC: 11.3 10*3/uL — ABNORMAL HIGH (ref 3.8–10.8)

## 2018-06-13 MED ORDER — ALBUTEROL SULFATE (2.5 MG/3ML) 0.083% IN NEBU: 2.5000 mg | INHALATION_SOLUTION | Freq: Four times a day (QID) | RESPIRATORY_TRACT | 1 refills | Status: DC | PRN

## 2018-06-13 MED ORDER — PREDNISONE 50 MG PO TABS: ORAL_TABLET | ORAL | 0 refills | Status: DC

## 2018-06-13 MED ORDER — DOXYCYCLINE HYCLATE 100 MG PO TABS: 100.0000 mg | ORAL_TABLET | Freq: Two times a day (BID) | ORAL | 0 refills | Status: AC

## 2018-06-13 MED ORDER — GUAIFENESIN-CODEINE 100-10 MG/5ML PO SYRP: 5.0000 mL | ORAL_SOLUTION | Freq: Three times a day (TID) | ORAL | 0 refills | Status: DC | PRN

## 2018-06-13 MED ORDER — ALBUTEROL SULFATE (2.5 MG/3ML) 0.083% IN NEBU
2.5000 mg | INHALATION_SOLUTION | Freq: Once | RESPIRATORY_TRACT | Status: AC
  Administered 2018-06-13: 2.5 mg via RESPIRATORY_TRACT

## 2018-06-13 MED ORDER — METHYLPREDNISOLONE ACETATE 40 MG/ML IJ SUSP
40.0000 mg | Freq: Once | INTRAMUSCULAR | Status: AC
  Administered 2018-06-13: 40 mg via INTRAMUSCULAR

## 2018-06-13 NOTE — Patient Instructions (Addendum)
You are having an asthma exacerbation; this is why you are 'WHEEZING"   I am prescribing you a  TAPERING DOSE OF  prednisone to start tomorrow morning.   6 tablets all at once on Day 1,  Then taper by 1 tablet daily until gone   I am also prescribing doxycycline for your infection  .  This is an antibiotic.  Take doxycycline 2 times daily with food   I am prescribing albuterol to use in the nebulizer every 6 hours while you are awake.  The vials of medicine went to [facility]   You can continue to take Mucinex DM INSTEAD OF ROBITUSSIN for the coguh . Marland Kitchen  Mucinex DM is better because IT has  robitussin in it so you do not need to take  Both.   It does NOT have anything in it to raise your blood pressure or harm your kidneys or liver  AVOID MUCINEX D  (NOT DM!! )  Because it does contain a decongestant which can raise blood pressure and make you feel   Jittery   You can fill the  cheratussin  cough syrup to use at night if something stronger is needed .  It has codeine in it so it may make you drowsy  If you have trouble breathing,  Call 911 or get a family member to take you to the ER   Please take a probiotic ( Align, [place] or [employer]),  the generic version of one of these, or eat a serving of good quality yogurt With live cultures   For a minimum of 3 weeks to prevent a serious antibiotic associated diarrhea  Called clostridium dificile colitis

## 2018-06-13 NOTE — Progress Notes (Signed)
Subjective:  Patient ID: Lauren Morris, female    DOB: 07-14-1931  Age: 84 y.o. MRN: [id number]  CC: The primary encounter diagnosis was Cough. Diagnoses of Moderate asthma with exacerbation, unspecified whether persistent, Acute bronchitis with COPD ([place]), Abnormal chest CT, and Elevated hemoglobin ([place]) were also pertinent to this visit.  HPI Lauren Morris presents for evaluation of cough productive  of purulent sputum.  Her cough  started 2 days ago.  No fevers, or body aches.  No recent travel,  No known sick contacts.   Symptoms worse last night,  Included chest tightness,  Wheezing, worse when  supine.  .    Patient was treated for same symptoms one month ago for an episode that started after spending a  Brief period in her yard.  She was treated with prednisone and doxycycline and had markedly improved at one week follow up. Chest  X ray was negarive for infiltrates  .  She has a probable occupationally induced pulmonary condition  but has declined workup thus far.   She was noted to be relatively hypoxic with ambulation ,  sats dropped to 88%  And improved to 90% with rest   Outpatient Medications Prior to Visit  Medication Sig Dispense Refill  . amLODipine (NORVASC) 10 MG tablet TAKE ONE TABLET BY MOUTH EVERY DAY 90 tablet 1  . Calcium Carb-Cholecalciferol (CALCIUM 1000 + D PO) Take 1 tablet by mouth daily.      . Cholecalciferol (VITAMIN D) 1000 UNITS capsule Take 1,000 Units by mouth daily.      . dorzolamide-timolol (COSOPT) 22.3-6.8 MG/ML ophthalmic solution dorzolamide 22.3 mg-timolol 6.8 mg/mL eye drops    . fish oil-omega-3 fatty acids 1000 MG capsule Take 2 g by mouth daily.      . furosemide (LASIX) 20 MG tablet TAKE ONE TABLET BY MOUTH DAILY AS NEEDEDFOR FLUID RETENTION 30 tablet 3  . levothyroxine (SYNTHROID, LEVOTHROID) 25 MCG tablet TAKE ONE TABLET BY MOUTH EVERY DAY 30 tablet 8  . doxycycline (VIBRAMYCIN) 100 MG capsule Take 100 mg by mouth 2 (two) times daily.     No  facility-administered medications prior to visit.     Review of Systems;  Patient denies headache, fevers, malaise, unintentional weight loss, skin rash, eye pain, sinus congestion and sinus pain, sore throat, dysphagia,  hemoptysis , cough, chest pain, palpitations, orthopnea, edema, abdominal pain, nausea, melena, diarrhea, constipation, flank pain, dysuria, hematuria, urinary  Frequency, nocturia, numbness, tingling, seizures,  Focal weakness, Loss of consciousness,  Tremor, insomnia, depression, anxiety, and suicidal ideation.      Objective:  BP 138/70 (BP Location: Left Arm, Patient Position: Sitting, Cuff Size: Large)   Pulse (!) 102   Temp 98.6 F (37 C) (Oral)   Resp 17   Ht 5\' 2"  (1.575 m)   Wt 180 lb 3.2 oz (81.7 kg)   SpO2 90%   BMI 32.96 kg/m   BP Readings from Last 3 Encounters:  06/13/18 138/70  05/15/18 140/70  05/09/18 140/70    Wt Readings from Last 3 Encounters:  06/13/18 180 lb 3.2 oz (81.7 kg)  05/15/18 177 lb 8 oz (80.5 kg)  05/09/18 180 lb 6.4 oz (81.8 kg)    General appearance: alert, cooperative and appears stated age. Not tachypneic  Ears: normal TM's and external ear canals both ears Throat: lips, mucosa, and tongue normal; teeth and gums normal Neck: no adenopathy, no carotid bruit, supple, symmetrical, trachea midline and thyroid not enlarged, symmetric, no tenderness/mass/nodules  Back: symmetric, no curvature. ROM normal. No CVA tenderness. Lungs: bilateral wheezing  With fair air movement. Heart: regular rate and rhythm, S1, S2 normal, no murmur, click, rub or gallop Abdomen: soft, non-tender; bowel sounds normal; no masses,  no organomegaly Pulses: 2+ and symmetric Skin: Skin color, texture, turgor normal. No rashes or lesions Lymph nodes: Cervical, supraclavicular, and axillary nodes normal.  Lab Results  Component Value Date   HGBA1C 5.9 (A) 04/17/2018   HGBA1C 6.4 10/16/2017   HGBA1C 6.5 04/08/2017    Lab Results  Component Value  Date   CREATININE 0.80 04/17/2018   CREATININE 0.75 10/16/2017   CREATININE 0.81 04/08/2017    Lab Results  Component Value Date   WBC 11.3 (H) 06/13/2018   HGB 16.5 (H) 06/13/2018   HCT 48.4 (H) 06/13/2018   PLT 222 06/13/2018   GLUCOSE 119 (H) 04/17/2018   CHOL 147 10/16/2017   TRIG 142.0 10/16/2017   HDL 35.40 (L) 10/16/2017   LDLDIRECT 80.0 04/08/2017   LDLCALC 83 10/16/2017   ALT 29 04/17/2018   AST 33 04/17/2018   NA 137 04/17/2018   K 4.3 04/17/2018   CL 103 04/17/2018   CREATININE 0.80 04/17/2018   BUN 12 04/17/2018   CO2 25 04/17/2018   TSH 2.49 04/17/2018   HGBA1C 5.9 (A) 04/17/2018   MICROALBUR 1.3 04/17/2018     Assessment & Plan:   Problem List Items Addressed This Visit    Abnormal chest CT   Relevant Orders   Ambulatory referral to Pulmonology   Acute bronchitis with COPD ([place])    This is her second episode in 1 month.  Albuterol nebulizer was given in house and tolerated with mild improvement in wheezing noted on her post neb exam.  I have also prescribed the albuterol nebs for home use to avoid the expense of MDI.  Antibiotic and steroids also prescribed.  Follow up on Monday to ensure clinical improvement.  Referral to Pulmonary strongly advised to work up her as yet undiagnosed chronic respiratory condition, likely a pulmonary fibrosis       Relevant Medications   albuterol (PROVENTIL) (2.5 MG/3ML) 0.083% nebulizer solution 2.5 mg (Completed)   guaiFENesin-codeine (CHERATUSSIN AC) 100-10 MG/5ML syrup   methylPREDNISolone acetate (DEPO-MEDROL) injection 40 mg (Completed)   albuterol (PROVENTIL) (2.5 MG/3ML) 0.083% nebulizer solution   Other Relevant Orders   Ambulatory referral to Pulmonology   Elevated hemoglobin ([place])    Noted today . Will have her return for iron studies.       Relevant Orders   Iron, TIBC and Ferritin Panel    Other Visit Diagnoses    Cough    -  Primary   Relevant Orders   DG Chest 2 View (Completed)   CBC with  Differential/Platelet (Completed)   Moderate asthma with exacerbation, unspecified whether persistent       Relevant Medications   albuterol (PROVENTIL) (2.5 MG/3ML) 0.083% nebulizer solution 2.5 mg (Completed)   methylPREDNISolone acetate (DEPO-MEDROL) injection 40 mg (Completed)   albuterol (PROVENTIL) (2.5 MG/3ML) 0.083% nebulizer solution   Other Relevant Orders   Ambulatory referral to Pulmonology    A total of 25 minutes of face to face time was spent with patient more than half of which was spent in counselling about the above mentioned conditions  and coordination of care   I have discontinued Dory M. Weigelt's doxycycline. I am also having her start on guaiFENesin-codeine and albuterol. Additionally, I am having her maintain her Calcium Carb-Cholecalciferol (CALCIUM  1000 + D PO), Vitamin D, fish oil-omega-3 fatty acids, furosemide, dorzolamide-timolol, levothyroxine, and amLODipine. We administered albuterol and methylPREDNISolone acetate.  Meds ordered this encounter  Medications  . albuterol (PROVENTIL) (2.5 MG/3ML) 0.083% nebulizer solution 2.5 mg  . guaiFENesin-codeine (CHERATUSSIN AC) 100-10 MG/5ML syrup    Sig: Take 5 mLs by mouth 3 (three) times daily as needed for cough.    Dispense:  120 mL    Refill:  0  . methylPREDNISolone acetate (DEPO-MEDROL) injection 40 mg  . albuterol (PROVENTIL) (2.5 MG/3ML) 0.083% nebulizer solution    Sig: Take 3 mLs (2.5 mg total) by nebulization every 6 (six) hours as needed for wheezing or shortness of breath.    Dispense:  150 mL    Refill:  1    Medications Discontinued During This Encounter  Medication Reason  . doxycycline (VIBRAMYCIN) 100 MG capsule     Follow-up: No follow-ups on file.   [name], MD

## 2018-06-13 NOTE — Telephone Encounter (Signed)
Received a call from after hours that doxy and prednisone needed to be sent in from earlier today. Sent medications in.   Rosemarie Ax, MD [facility] 06/13/2018, 9:16 PM

## 2018-06-15 ENCOUNTER — Encounter: Payer: Self-pay | Admitting: Internal Medicine

## 2018-06-15 DIAGNOSIS — D582 Other hemoglobinopathies: Secondary | ICD-10-CM | POA: Insufficient documentation

## 2018-06-15 NOTE — Assessment & Plan Note (Addendum)
This is her second episode in 1 month.  Albuterol nebulizer was given in house and tolerated with mild improvement in wheezing noted on her post neb exam.  I have also prescribed the albuterol nebs for home use to avoid the expense of MDI.  Antibiotic and steroids also prescribed.  Follow up on Monday to ensure clinical improvement.  Referral to Pulmonary strongly advised to work up her as yet undiagnosed chronic respiratory condition, likely a pulmonary fibrosis

## 2018-06-15 NOTE — Assessment & Plan Note (Signed)
Noted today . Will have her return for iron studies.

## 2018-06-16 ENCOUNTER — Ambulatory Visit (INDEPENDENT_AMBULATORY_CARE_PROVIDER_SITE_OTHER): Payer: Medicare Other | Admitting: Family Medicine

## 2018-06-16 ENCOUNTER — Encounter: Payer: Self-pay | Admitting: Family Medicine

## 2018-06-16 VITALS — BP 130/68 | HR 82 | Temp 97.6°F | Ht 62.0 in | Wt 178.0 lb

## 2018-06-16 DIAGNOSIS — J45901 Unspecified asthma with (acute) exacerbation: Secondary | ICD-10-CM

## 2018-06-16 DIAGNOSIS — R05 Cough: Secondary | ICD-10-CM | POA: Diagnosis not present

## 2018-06-16 DIAGNOSIS — J440 Chronic obstructive pulmonary disease with acute lower respiratory infection: Secondary | ICD-10-CM | POA: Diagnosis not present

## 2018-06-16 DIAGNOSIS — R059 Cough, unspecified: Secondary | ICD-10-CM

## 2018-06-16 DIAGNOSIS — D582 Other hemoglobinopathies: Secondary | ICD-10-CM

## 2018-06-16 DIAGNOSIS — J209 Acute bronchitis, unspecified: Secondary | ICD-10-CM

## 2018-06-16 NOTE — Progress Notes (Signed)
   Subjective:    Patient ID: Lauren Morris, female    DOB: 10-Jan-1932, 84 y.o.   MRN: [id number]  HPI  Presents to clinic to follow up on asthma exacerbation.   Seen on 06/13/18 and treated with doxycycline course, albuterol and steroid IM in clinic and oral steroids.   CXR done 06/13/18, CXR results reviewed by me, impression of xray states no acute disease - only chronic interstitial lung changes seen.  Patient states she is feeling much better today.  Cough seems improved, breathing is improved.  She continues to use her nebulizer machine between 2-3 times a day with good effect.  Patient states so far she is gotten in 3 doses of antibiotic and 2 doses of the steroid pills.  Patient Active Problem List   Diagnosis Date Noted  . Elevated hemoglobin ([place]) 06/15/2018  . Acute bronchitis with COPD ([place]) 05/10/2018  . Abnormal chest CT 05/25/2016  . Fatty liver disease, nonalcoholic 46/65/9935  . Obesity 11/13/2012  . Diabetes mellitus without complication ([place]) 70/17/7939  . Medicare annual wellness visit, subsequent 03/11/2012  . Hyperlipidemia LDL goal <100 04/15/2011  . Essential hypertension 02/22/2011  . Acquired hypothyroidism 02/22/2011   Social History   Tobacco Use  . Smoking status: Never Smoker  . Smokeless tobacco: Never Used  Substance Use Topics  . Alcohol use: No   Review of Systems  Constitutional: Negative for chills, fatigue and fever.  HENT: Negative for congestion, ear pain, sinus pain and sore throat.   Eyes: Negative.   Respiratory: Slight cough, better than 06/13/18. Negative for shortness of breath and wheezing.   Cardiovascular: Negative for chest pain, palpitations and leg swelling.  Gastrointestinal: Negative for abdominal pain, diarrhea, nausea and vomiting.  Genitourinary: Negative for dysuria, frequency and urgency.  Musculoskeletal: Negative for arthralgias and myalgias.  Skin: Negative for color change, pallor and rash.  Neurological:  Negative for syncope, light-headedness and headaches.  Psychiatric/Behavioral: The patient is not nervous/anxious.       Objective:   Physical Exam  Constitutional: She appears well-developed and well-nourished. No distress.  HENT:  Head: Normocephalic and atraumatic.  Eyes: EOM are normal. No scleral icterus.  Neck: Normal range of motion. Neck supple. No tracheal deviation present.  Cardiovascular: Normal rate, regular rhythm and normal heart sounds.  Pulmonary/Chest: Effort normal and breath sounds normal. No respiratory distress. She has no wheezes. She has no rales.  Neurological: She is alert and oriented to person, place, and time. Gait normal  Skin: Skin is warm and dry. No pallor.  Psychiatric: She has a normal mood and affect. Her behavior is normal. Thought content normal.   Nursing note and vitals reviewed.    Vitals:   06/16/18 0825  BP: 130/68  Pulse: 82  Temp: 97.6 F (36.4 C)  SpO2: 95%   Assessment & Plan:   Acute bronchitis with COPD, cough, moderate asthma with exacerbation- patient's breathing has improved.  She will continue doxycycline course and finish prednisone course as well.  Advised to continue doing nebulizer treatments at least 2-3 times per day.  Patient advised that if her symptoms worsen, shortness of breath is not improved with use of nebulizer, she develops fever or chills-she is to call office right away and/or go to ER for evaluation.  Elevated hemoglobin- CBC done on 06/13/2018 did show an elevated hemoglobin.  Iron studies to be checked today.  Patient will keep regularly plan follow-up with PCP.

## 2018-06-17 LAB — IRON,TIBC AND FERRITIN PANEL
%SAT: 35 % (calc) (ref 16–45)
Ferritin: 204 ng/mL (ref 16–288)
Iron: 93 ug/dL (ref 45–160)
TIBC: 267 mcg/dL (calc) (ref 250–450)

## 2018-07-08 ENCOUNTER — Other Ambulatory Visit: Payer: Self-pay | Admitting: Internal Medicine

## 2018-07-15 ENCOUNTER — Other Ambulatory Visit: Payer: Self-pay | Admitting: Internal Medicine

## 2018-07-15 MED ORDER — LEVOTHYROXINE SODIUM 25 MCG PO TABS: 25.0000 ug | ORAL_TABLET | Freq: Every day | ORAL | 2 refills | Status: DC

## 2018-07-15 NOTE — Telephone Encounter (Signed)
Copied from [place]. Topic: Quick Communication - Rx Refill/Question >> Jul 15, 2018 12:54 PM Carolyn Stare wrote: Medication  levothyroxine (SYNTHROID, LEVOTHROID) 25 MCG tablet     pt changed pharmacy and will need a new RX sent   Has the patient contacted their pharmacy yes    Preferred Pharm[facility]  [place]   Agent: Please be advised that RX refills may take up to 3 business days. We ask that you follow-up with your pharmacy.

## 2018-07-17 ENCOUNTER — Ambulatory Visit (INDEPENDENT_AMBULATORY_CARE_PROVIDER_SITE_OTHER): Payer: Medicare Other | Admitting: Pulmonary Disease

## 2018-07-17 ENCOUNTER — Encounter: Payer: Self-pay | Admitting: Pulmonary Disease

## 2018-07-17 VITALS — BP 124/70 | HR 82 | Ht 62.0 in | Wt 178.0 lb

## 2018-07-17 DIAGNOSIS — J849 Interstitial pulmonary disease, unspecified: Secondary | ICD-10-CM

## 2018-07-17 DIAGNOSIS — I517 Cardiomegaly: Secondary | ICD-10-CM | POA: Diagnosis not present

## 2018-07-17 DIAGNOSIS — R0609 Other forms of dyspnea: Secondary | ICD-10-CM

## 2018-07-17 NOTE — Progress Notes (Signed)
Subjective:    Patient ID: Lauren Morris, female    DOB: February 11, 1932, 85 y.o.   MRN: [id number]  HPI Lauren Morris is an 85 year old lifelong never smoker who presents for evaluation of dyspnea on exertion worse over the last year.  She is kindly referred by Dr. [name].  The patient states that she has had this issue for "years" however her daughter who presents with her today states that the symptoms definitely have been more noticeable over the last year.  The patient does not endorse this but it has been evident to the family.  She apparently becomes quite short of breath when walking fast or long distances.  She states that her symptoms can be made worse also by exposure to cold air.  He has been given albuterol to use as needed but she states that she does not know when she needs it.  She just uses it intermittently during the day.  She does not have a cough.  No gastroesophageal reflux symptoms.  No fevers, chills or sweats.  No hemoptysis.  She does not endorse orthopnea, paroxysmal nocturnal dyspnea but she does have nocturia.  No other sleep associated symptoms.  She does endorse lower extremity edema for which she has to take "a pill" for management.  Suspect this is erosive mild as noted on her medication record.  The patient's past medical history, surgical history and family history have been reviewed.  They are as noted.  Medications have been reviewed.  As noted she is a lifelong never smoker.  She lives alone in her own home.  She is independent of her activities of daily living.  She used to work in [employer] having worked on several areas of [employer].  She is retired from this trade.  She has a sedentary lifestyle other than her activities at home doing light housecleaning etc.    Review of Systems  Constitutional: Positive for fatigue.  HENT: Negative.   Eyes: Negative.   Respiratory: Positive for shortness of breath (On exertion). Negative for cough, chest tightness and  wheezing.   Cardiovascular: Positive for leg swelling. Negative for chest pain and palpitations.  Gastrointestinal: Negative.        No reflux symptoms  Endocrine: Negative.   Genitourinary:       Nocturia otherwise negative  Musculoskeletal: Negative.   Skin: Negative.   Allergic/Immunologic: Negative.   Neurological: Negative.   Hematological: Negative.   Psychiatric/Behavioral: Negative.   All other systems reviewed and are negative.      Objective:   Physical Exam Vitals signs reviewed.  Constitutional:      Appearance: She is well-developed. She is obese. She is not ill-appearing.     Comments: Looks younger than stated age.  HENT:     Head: Normocephalic and atraumatic.     Mouth/Throat:     Mouth: Mucous membranes are moist.     Pharynx: Oropharynx is clear. No oropharyngeal exudate.  Eyes:     Extraocular Movements: Extraocular movements intact.     Pupils: Pupils are equal, round, and reactive to light.  Neck:     Musculoskeletal: Neck supple.     Thyroid: No thyromegaly.     Vascular: No JVD.     Trachea: No tracheal deviation.  Cardiovascular:     Rate and Rhythm: Normal rate and regular rhythm.  No extrasystoles are present.    Pulses: Normal pulses.     Heart sounds: Normal heart sounds.  Pulmonary:  Effort: Pulmonary effort is normal.     Breath sounds: Normal breath sounds.  Abdominal:     General: Bowel sounds are normal.     Palpations: Abdomen is soft.  Musculoskeletal: Normal range of motion.     Right lower leg: No edema.     Left lower leg: No edema.  Lymphadenopathy:     Cervical: No cervical adenopathy.  Skin:    General: Skin is warm and dry.  Neurological:     General: No focal deficit present.     Mental Status: She is alert and oriented to person, place, and time.  Psychiatric:        Mood and Affect: Mood normal.        Behavior: Behavior normal.     I have reviewed the patient's chest x-ray from June 14, 2019: This  shows cardiomegaly and interstitial markings increase.  The patient had a chest CT in 2017 that was also reviewed, this was also compared to a CT performed in 2010.  The patient has some subtle interstitial markings in groundglass attenuation noted on these 2 studies.  There is also cardiomegaly noted.      Assessment & Plan:   1.  Dyspnea on exertion: Is having symptoms of dyspnea on exertion that have been worsening over the last year.  This could be related to potential interstitial lung disease versus cardiac disease versus deconditioning.  Recommend obtaining pulmonary function testing and 2D echo to evaluate for potential issues such as pulmonary hypertension.  Will be scheduled for the patient and we will see her in follow-up in 2 to 3 weeks time.  She is to contact us prior to that time should any new difficulties arise.   2.  Interstitial lung disease: Not classified as of yet.  PFTs will determine whether repeat CT scan will be necessary.  3.  Cardiomegaly on imaging study: We will perform 2D echo as noted above.  Thank you for allowing me to participate in this patient's care.

## 2018-07-17 NOTE — Patient Instructions (Addendum)
1.  We will request breathing tests.  2.  We will also check your heart.  3.  You may need a repeat CT scan of the chest in the future.

## 2018-07-22 ENCOUNTER — Other Ambulatory Visit: Payer: Self-pay | Admitting: Internal Medicine

## 2018-07-22 MED ORDER — ALBUTEROL SULFATE (2.5 MG/3ML) 0.083% IN NEBU: 2.5000 mg | INHALATION_SOLUTION | Freq: Four times a day (QID) | RESPIRATORY_TRACT | 1 refills | Status: DC | PRN

## 2018-07-22 NOTE — Telephone Encounter (Signed)
Copied from [place] [id number]. Topic: Quick Communication - Rx Refill/Question >> Jul 22, 2018 10:18 AM Reyne Dumas L wrote: Medication: albuterol (PROVENTIL) (2.5 MG/3ML) 0.083% nebulizer solution  Has the patient contacted their pharmacy? Yes - states no refills left (Agent: If no, request that the patient contact the pharmacy for the refill.) (Agent: If yes, when and what did the pharmacy advise?)  Preferred Pharmacy (with phone number or street name): [place], [place] - [phone] (Phone) [phone] (Fax)  Agent: Please be advised that RX refills may take up to 3 business days. We ask that you follow-up with your pharmacy.

## 2018-07-31 ENCOUNTER — Ambulatory Visit (HOSPITAL_COMMUNITY): Payer: Medicare Other

## 2018-07-31 ENCOUNTER — Ambulatory Visit
Admission: RE | Admit: 2018-07-31 | Discharge: 2018-07-31 | Disposition: A | Discharge status: Home / Self Care | Payer: Medicare Other | Source: Ambulatory Visit | Attending: Pulmonary Disease | Admitting: Pulmonary Disease

## 2018-07-31 DIAGNOSIS — I10 Essential (primary) hypertension: Secondary | ICD-10-CM | POA: Diagnosis not present

## 2018-07-31 DIAGNOSIS — R0609 Other forms of dyspnea: Secondary | ICD-10-CM | POA: Diagnosis not present

## 2018-07-31 DIAGNOSIS — E079 Disorder of thyroid, unspecified: Secondary | ICD-10-CM | POA: Diagnosis not present

## 2018-07-31 DIAGNOSIS — I082 Rheumatic disorders of both aortic and tricuspid valves: Secondary | ICD-10-CM | POA: Diagnosis not present

## 2018-07-31 MED ORDER — ALBUTEROL SULFATE (2.5 MG/3ML) 0.083% IN NEBU
2.5000 mg | INHALATION_SOLUTION | Freq: Once | RESPIRATORY_TRACT | Status: AC
  Administered 2018-07-31: 2.5 mg via RESPIRATORY_TRACT
  Filled 2018-07-31: qty 3

## 2018-07-31 NOTE — Progress Notes (Signed)
*  PRELIMINARY RESULTS* Echocardiogram 2D Echocardiogram has been performed.  Sherrie Sport 07/31/2018, 10:53 AM

## 2018-08-13 ENCOUNTER — Ambulatory Visit: Payer: Medicare Other | Admitting: Pulmonary Disease

## 2018-08-14 ENCOUNTER — Encounter: Payer: Self-pay | Admitting: Pulmonary Disease

## 2018-08-14 ENCOUNTER — Ambulatory Visit (INDEPENDENT_AMBULATORY_CARE_PROVIDER_SITE_OTHER): Payer: Medicare Other | Admitting: Pulmonary Disease

## 2018-08-14 VITALS — BP 140/68 | HR 75 | Ht 62.0 in | Wt 183.8 lb

## 2018-08-14 DIAGNOSIS — I5189 Other ill-defined heart diseases: Secondary | ICD-10-CM

## 2018-08-14 DIAGNOSIS — E669 Obesity, unspecified: Secondary | ICD-10-CM

## 2018-08-14 DIAGNOSIS — J849 Interstitial pulmonary disease, unspecified: Secondary | ICD-10-CM | POA: Diagnosis not present

## 2018-08-14 DIAGNOSIS — R0600 Dyspnea, unspecified: Secondary | ICD-10-CM | POA: Diagnosis not present

## 2018-08-14 NOTE — Patient Instructions (Addendum)
1. We are going to schedule a scan to evaluate your lungs.  2. We will schedule an overnight oxygen level  3.  We will see you in 4 weeks time call sooner should any new difficulties arise.

## 2018-08-14 NOTE — Progress Notes (Signed)
Subjective:    Patient ID: Lauren Morris, female    DOB: 03-25-1932, 85 y.o.   MRN: [id number]  HPI Patient is an 85 year old lifelong never smoker presents for follow-up of dyspnea on exertion that has been worse over the last year.  Was first evaluated on 17 July 2018.  The patient underwent pulmonary function testing on 6 February.  This shows moderate restriction and moderate diffusion capacity impairment.  She did have some difficulty performing the maneuvers and results could be somewhat skewed however the independent portion of the flow-volume loop is consistent with restriction.  The patient did have abnormal CT scan of the chest in 2017 that shows some groundglass opacities so I suspect that she warrants repeat CT scan for evaluation of potential interstitial lung disease.  The patient also underwent 2D echocardiogram on 6 February shows that she has enlargement of the left atrium (mild), a small pericardial effusion, increased left ventricular wall thickness and diastolic dysfunction.  The patient does not endorse any new symptoms from those noted on her prior visit of 23 January.  She does note that she has to get up frequently during the night to go to the bathroom.  In the past she has had issues with snoring but does not note these currently.  She however sleeps alone.  She does not endorse restorative sleep.  She tries not to nap during the day.   Review of Systems  Constitutional: Positive for fatigue.  HENT: Negative.   Eyes: Negative.   Respiratory: Positive for shortness of breath.   Cardiovascular: Positive for leg swelling.  Gastrointestinal: Negative.   Endocrine: Negative.   Genitourinary: Negative.   Musculoskeletal: Positive for arthralgias and joint swelling.  Skin: Negative.   Allergic/Immunologic: Negative.   Neurological: Negative.   Hematological: Negative.   Psychiatric/Behavioral: Negative.        Objective:   Physical Exam Vitals signs and nursing note  reviewed.  Constitutional:      General: She is not in acute distress.    Appearance: She is obese.  HENT:     Head: Normocephalic and atraumatic.     Mouth/Throat:     Mouth: Mucous membranes are moist.  Eyes:     Extraocular Movements: Extraocular movements intact.  Neck:     Musculoskeletal: Neck supple.     Thyroid: No thyromegaly.     Trachea: No tracheal deviation.  Cardiovascular:     Rate and Rhythm: Normal rate and regular rhythm.     Pulses: Normal pulses.     Heart sounds: Normal heart sounds.  Pulmonary:     Effort: Pulmonary effort is normal.     Breath sounds: Normal breath sounds.  Abdominal:     General: Abdomen is protuberant.     Palpations: Abdomen is soft.  Musculoskeletal:     Right lower leg: No edema.     Left lower leg: No edema.     Comments: OA changes both hands  Skin:    General: Skin is warm and dry.  Neurological:     General: No focal deficit present.     Mental Status: She is alert and oriented to person, place, and time.  Psychiatric:        Mood and Affect: Mood normal.        Behavior: Behavior normal.     I reviewed the patient's most recent laboratory data.  She has relative polycythemia with her hemoglobin being 16.5 and hematocrit 48.4.  Assessment & Plan:   1.  Dyspnea: I suspect that this is multifactorial.  She has diastolic dysfunction, obesity with deconditioning, moderate restriction with diffusion capacity impairment which could be due to obesity versus interstitial lung disease.  In addition she is having issues with nocturnal awakenings which may be secondary to sleep disordered breathing.  We will obtain CT scan of the chest with high-resolution to evaluate for ILD and overnight oximetry to exclude potential nocturnal hypoxemia particularly in view of her relative polycythemia.  2.  Interstitial lung disease: On a prior CT scan performed in 2017 the patient had evidence of groundglass opacities which may have been due  to edema versus UIP.  These findings go back as far as 2010.  Will need to repeat to determine if these have progressed to scarring.  3.  Diastolic dysfunction of the left ventricle: Cardiogram is consistent with diastolic dysfunction.  She is currently on amlodipine query whether changing to diltiazem or verapamil may be of benefit particularly in view of diastolic dysfunction.  Will defer this decision to Dr. Derrel Nip who is her primary care physician.  This issue may be adding to her sensation of dyspnea.  4.  Obesity with a BMI of 33.6: Patient was counseled regards to weight loss.

## 2018-08-19 DIAGNOSIS — R0600 Dyspnea, unspecified: Secondary | ICD-10-CM | POA: Diagnosis not present

## 2018-08-22 ENCOUNTER — Telehealth: Payer: Self-pay

## 2018-08-22 DIAGNOSIS — G4734 Idiopathic sleep related nonobstructive alveolar hypoventilation: Secondary | ICD-10-CM

## 2018-08-22 NOTE — Telephone Encounter (Signed)
Spoke to patient ONO results. Will need 2L qHS with humidifier bottle. Patient aware.   Orders entered.

## 2018-08-25 ENCOUNTER — Telehealth: Payer: Self-pay | Admitting: Pulmonary Disease

## 2018-08-25 DIAGNOSIS — J849 Interstitial pulmonary disease, unspecified: Secondary | ICD-10-CM

## 2018-08-25 DIAGNOSIS — G4734 Idiopathic sleep related nonobstructive alveolar hypoventilation: Secondary | ICD-10-CM

## 2018-08-25 NOTE — Telephone Encounter (Signed)
Spoke to pt and relayed below message. Pt would like to think about this and will call back with update.

## 2018-08-25 NOTE — Telephone Encounter (Signed)
lmtcb x1 for pt. 

## 2018-08-25 NOTE — Telephone Encounter (Signed)
Per Kern Reap with [facility] Nocturnal hypoxemia is not an approved diagnosis with pt's insurance for 02 at night.  Pt will either have to have a sleep study or come in and have a SMW to see if pt qualifies for 02 during the day in order to qualify for 02 at night.  Please advise. Rhonda J Cobb

## 2018-08-25 NOTE — Telephone Encounter (Signed)
error 

## 2018-08-26 NOTE — Telephone Encounter (Signed)
Spoke to patient, she would like to proceed with Sleep study. [place] entered. Orders entered.

## 2018-08-26 NOTE — Telephone Encounter (Signed)
Pt called back.  CB# [phone]

## 2018-09-08 ENCOUNTER — Telehealth: Payer: Self-pay | Admitting: Pulmonary Disease

## 2018-09-08 NOTE — Telephone Encounter (Signed)
Pt called and stated that her sleep study has been scheduled for 11/28/2018 and pt stated that she was advised to F/U 5-10 days after her Sleep Study. Pt has not had her CT Chest.  Appointment was changed per pt request to 12/05/2018 at 8:30 and appointment for 09/15/2018 was canceled.   Phone message taken as an Pharmacist, hospital for physician. Rhonda J Cobb

## 2018-09-09 ENCOUNTER — Ambulatory Visit: Payer: Medicare Other

## 2018-09-10 ENCOUNTER — Other Ambulatory Visit: Payer: Self-pay | Admitting: Internal Medicine

## 2018-09-15 ENCOUNTER — Ambulatory Visit: Payer: Medicare Other | Admitting: Pulmonary Disease

## 2018-10-03 ENCOUNTER — Other Ambulatory Visit: Payer: Self-pay | Admitting: Internal Medicine

## 2018-10-03 MED ORDER — FUROSEMIDE 20 MG PO TABS: ORAL_TABLET | ORAL | 0 refills | Status: DC

## 2018-10-03 NOTE — Telephone Encounter (Signed)
Requested Prescriptions  Pending Prescriptions Disp Refills  . furosemide (LASIX) 20 MG tablet 30 tablet 3    Sig: TAKE ONE TABLET BY MOUTH DAILY AS NEEDEDFOR FLUID RETENTION     Cardiovascular:  Diuretics - Loop Failed - 10/03/2018 10:50 AM      Failed - Last BP in normal range    BP Readings from Last 1 Encounters:  08/14/18 140/68         Passed - K in normal range and within 360 days    Potassium  Date Value Ref Range Status  04/17/2018 4.3 3.5 - 5.1 mEq/L Final         Passed - Ca in normal range and within 360 days    Calcium  Date Value Ref Range Status  04/17/2018 9.5 8.4 - 10.5 mg/dL Final         Passed - Na in normal range and within 360 days    Sodium  Date Value Ref Range Status  04/17/2018 137 135 - 145 mEq/L Final         Passed - Cr in normal range and within 360 days    Creat  Date Value Ref Range Status  10/22/2011 0.72 0.50 - 1.10 mg/dL Final   Creatinine, Ser  Date Value Ref Range Status  04/17/2018 0.80 0.40 - 1.20 mg/dL Final         Passed - Valid encounter within last 6 months    Recent Outpatient Visits          3 months ago Acute bronchitis with COPD ([place])   [place], FNP   3 months ago Cough   [facility] [name], MD   4 months ago Acute bronchitis, unspecified organism   [facility] [name], MD   4 months ago Urinary frequency   [place] [facility], Jacquelynn Cree, FNP   4 months ago Cough productive of purulent sputum   [facility] [name], MD      Future Appointments            In 3 weeks Derrel Nip Aris Everts, MD [place], [facility]

## 2018-10-17 ENCOUNTER — Encounter: Payer: Medicare Other | Admitting: Internal Medicine

## 2018-10-24 ENCOUNTER — Ambulatory Visit: Payer: Medicare Other | Admitting: Internal Medicine

## 2018-11-07 ENCOUNTER — Ambulatory Visit: Payer: Medicare Other

## 2018-11-11 ENCOUNTER — Telehealth: Payer: Self-pay | Admitting: Internal Medicine

## 2018-11-11 ENCOUNTER — Other Ambulatory Visit: Payer: Self-pay

## 2018-11-11 MED ORDER — ALBUTEROL SULFATE (2.5 MG/3ML) 0.083% IN NEBU: 2.5000 mg | INHALATION_SOLUTION | Freq: Four times a day (QID) | RESPIRATORY_TRACT | 1 refills | Status: DC | PRN

## 2018-11-11 NOTE — Telephone Encounter (Signed)
rx sent in 

## 2018-11-11 NOTE — Telephone Encounter (Signed)
Copied from [place] [phone] [phone]. Topic: Quick Communication - Rx Refill/Question >> Nov 11, 2018  1:21 PM Oneta Rack wrote: Medication: albuterol (PROVENTIL) (2.5 MG/3ML) 0.083% nebulizer solution   Preferred Pharmacy (with phone number or street name):  [place], [place] - [place] [phone] (Phone) [phone] (Fax)   Agent: Please be advised that RX refills may take up to 3 business days. We ask that you follow-up with your pharmacy.

## 2018-11-28 ENCOUNTER — Ambulatory Visit: Payer: Medicare Other | Attending: Internal Medicine

## 2018-11-28 DIAGNOSIS — J449 Chronic obstructive pulmonary disease, unspecified: Secondary | ICD-10-CM | POA: Insufficient documentation

## 2018-12-01 ENCOUNTER — Other Ambulatory Visit: Payer: Self-pay

## 2018-12-03 DIAGNOSIS — G4733 Obstructive sleep apnea (adult) (pediatric): Secondary | ICD-10-CM | POA: Diagnosis not present

## 2018-12-05 ENCOUNTER — Ambulatory Visit: Payer: Medicare Other | Admitting: Pulmonary Disease

## 2018-12-10 ENCOUNTER — Telehealth: Payer: Self-pay | Admitting: Pulmonary Disease

## 2018-12-10 NOTE — Telephone Encounter (Signed)
Sleep study performed on 11/28/2018, confirmed severe OSA with AHI of 45. Recommend auto cpap 5-20cm h2O. It appears that sleep study was ordered, due to insurance not covering nocturnal oxygen without sleep study.  LG would you  Like to proceed with cpap or attempt night time oxygen as previously prescribed.

## 2018-12-11 NOTE — Telephone Encounter (Addendum)
Per LG- can do trail of auto cpap.  ATC pt to make her aware of results/recommendations- no answer with no option to leave voicemail. Will call back.

## 2018-12-12 NOTE — Telephone Encounter (Signed)
ATC pt x2- no answer with no option to leave message. Received a message to enter my access code.  Will call back.

## 2018-12-12 NOTE — Telephone Encounter (Signed)
Noted  

## 2018-12-15 NOTE — Telephone Encounter (Signed)
Pt is calling back [phone]

## 2018-12-15 NOTE — Telephone Encounter (Signed)
Called and spoke to pt.  Pt wished to discuss cpap with LG at upcoming [place] on 12/24/2018.

## 2018-12-15 NOTE — Telephone Encounter (Signed)
Called and spoke to pt and relayed below recommendations.  Pt would like to think about therapy and will give our office a call back.

## 2018-12-18 ENCOUNTER — Other Ambulatory Visit: Payer: Self-pay | Admitting: Internal Medicine

## 2018-12-18 NOTE — Telephone Encounter (Signed)
Medication Refill - Medication: amLODipine (NORVASC) 10 MG tablet   Has the patient contacted their pharmacy? No. (Agent: If no, request that the patient contact the pharmacy for the refill.) (Agent: If yes, when and what did the pharmacy advise?)  Preferred Pharmacy (with phone number or street name):  [place], [place] - [place] [phone] (Phone) [phone] (Fax)     Agent: Please be advised that RX refills may take up to 3 business days. We ask that you follow-up with your pharmacy.

## 2018-12-19 MED ORDER — AMLODIPINE BESYLATE 10 MG PO TABS: 10.0000 mg | ORAL_TABLET | Freq: Every day | ORAL | 1 refills | Status: DC

## 2018-12-19 NOTE — Telephone Encounter (Signed)
Medication filled.  

## 2018-12-22 ENCOUNTER — Other Ambulatory Visit: Payer: Self-pay

## 2018-12-22 ENCOUNTER — Ambulatory Visit
Admission: RE | Admit: 2018-12-22 | Discharge: 2018-12-22 | Disposition: A | Discharge status: Home / Self Care | Payer: Medicare Other | Source: Ambulatory Visit | Attending: Pulmonary Disease | Admitting: Pulmonary Disease

## 2018-12-22 DIAGNOSIS — R0602 Shortness of breath: Secondary | ICD-10-CM | POA: Diagnosis not present

## 2018-12-22 DIAGNOSIS — J849 Interstitial pulmonary disease, unspecified: Secondary | ICD-10-CM | POA: Diagnosis not present

## 2018-12-23 ENCOUNTER — Telehealth: Payer: Self-pay | Admitting: Pulmonary Disease

## 2018-12-23 NOTE — Telephone Encounter (Signed)
Called patient for COVID-19 pre-screening for in office visit. ° °Have you recently traveled any where out of the local area in the last 2 weeks? No ° °Have you been in close contact with a person diagnosed with COVID-19 or someone awaiting results within the last 2 weeks? No ° °Do you currently have any of the following symptoms? If so, when did they start? °Cough     Diarrhea   Joint Pain °Fever      Muscle Pain   Red eyes °Shortness of breath   Abdominal pain  Vomiting °Loss of smell    Rash    Sore Throat °Headache    Weakness   Bruising or bleeding ° ° °Okay to proceed with visit 12/24/2018  °  ° °

## 2018-12-24 ENCOUNTER — Encounter: Payer: Self-pay | Admitting: Pulmonary Disease

## 2018-12-24 ENCOUNTER — Ambulatory Visit (INDEPENDENT_AMBULATORY_CARE_PROVIDER_SITE_OTHER): Payer: Medicare Other | Admitting: Pulmonary Disease

## 2018-12-24 DIAGNOSIS — I5189 Other ill-defined heart diseases: Secondary | ICD-10-CM | POA: Diagnosis not present

## 2018-12-24 DIAGNOSIS — G4733 Obstructive sleep apnea (adult) (pediatric): Secondary | ICD-10-CM

## 2018-12-24 DIAGNOSIS — R0600 Dyspnea, unspecified: Secondary | ICD-10-CM | POA: Diagnosis not present

## 2018-12-24 NOTE — Progress Notes (Signed)
Subjective:    Patient ID: Lauren Morris, female    DOB: June 30, 1931, 85 y.o.   MRN: [id number]  Patient had follow-up visit today to discuss sleep study performed on 29 November 2018.  This is a virtual visit via telephone due to the COVID-19 pandemic.  The patient understands the limitations with regards to diagnosis as the visit is not face-to-face and gives permission to go ahead.  We connected with the patient at 09:36 hours, 2 identifiers were utilized to determine we had the correct patient.  The patient had her daughter on the line as well and gave permission to discuss her case.  HPI The patient is an 85 year old lifelong never smoker whom we are following up with today with regards to a sleep study performed on 29 November 2018.  The patient has issues with dyspnea on exertion that have been worse over the last year.  Previously we have evaluated her with pulmonary function testing which shows mild to moderate restriction and moderate diffusion capacity impairment.  Prior CT scans have been indeterminate with regards to interstitial lung disease.  She had repeat CT scan on 29 June again shows nonspecific groundglass attenuation prior scarring but no evidence of interstitial lung disease per se.  She has increased size of the pulmonary arteries consistent with pulmonary hypertension.  She has had a prior 2D echo that is consistent with diastolic dysfunction.  The sleep study that was performed on 6 June shows that the patient has severe sleep apnea with at least 86 episodes per hour of apnea/hypopnea.  We discussed the implications of obstructive sleep apnea and the need to treat this as it may be aggravating issues with pulmonary hypertension and therefore her dyspnea.  The patient previously had been adamant about not wearing CPAP equipment however she is willing to consider this after our discussion today.  Given that she is hesitant to undergo CPAP therapy I would recommend auto CPAP with compliance  download.  The patient and her daughter had opportunities to ask questions.  These were answered to their satisfaction.  Her main concerns were about when was she going to be called by the DME company and whether she would see the number on her Caller ID.  We reassured her that she would receive instructions as to the proper use of the CPAP equipment.  The patient's dyspnea has been stable she has not noticed any worsening since her last visit here in February.  Review of Systems  Constitutional: Positive for fatigue.  HENT: Negative.   Eyes: Negative.   Respiratory: Positive for shortness of breath.   Cardiovascular: Positive for leg swelling.  Gastrointestinal: Negative.   Endocrine: Negative.   Genitourinary: Negative.   Neurological: Negative.   Psychiatric/Behavioral: Negative.   All other systems reviewed and are negative.      Objective:   Physical Exam  Face-to-face physical exam not performed as this was 12 visit via telephone.  The patient did not appear to be in acute distress during the conversation.  She could complete sentences without difficulty.     Assessment & Plan:  1.  Severe sleep apnea: Patient will need management for severe sleep apnea.  We will initiate treatment with AutoPap at 5 to 20 cm water pressure titration.  Download of compliance card within a month.  No oxygen bled in for now.  Overnight oximetry on AutoPap once she has acclimatized to the device.  Recommend heated humidity and mask of choice patient may prefer nasal  pillows.  Order has been sent to [facility] division of [facility].  2.  Dyspnea: I suspect that this is multifactorial.  She has diastolic dysfunction, obesity with deconditioning, moderate restriction with diffusion capacity impairment which could be due to obesity it does not appear that she has interstitial lung disease per se.    She may have issues with pulmonary hypertension that may be underestimated by 2D echo.  A sleep study has shown  severe sleep apnea and therefore the patient will need management for this as above.  3.  Interstitial lung disease: On a prior CT scan performed in 2017 the patient had evidence of groundglass opacities which may have been due to edema versus UIP.    Follow-up CT on 29 June showed no evidence of frank interstitial lung disease.  She continues to have some issues with groundglass/mosaic attenuation which may be related to volume overload from diastolic dysfunction.  The finding is nonspecific.  4.  Diastolic dysfunction of the left ventricle:  Echo cardiogram is consistent with diastolic dysfunction.  She is currently on amlodipine query whether changing to diltiazem or verapamil may be of benefit particularly in view of diastolic dysfunction.  Will continue to defer this decision to Dr. Derrel Nip who is her primary care physician.  This issue may be adding to her sensation of dyspnea.   We will see the patient in follow-up in 2 months time.  This visit ideally should be done face-to-face.  Download of her compliance with AutoPap should be available.   Total phone visit time: 22 minutes  This chart was dictated using voice recognition software/Dragon.  Despite best efforts to proofread, errors can occur which can change the meaning.  Any change was purely unintentional.

## 2019-01-13 ENCOUNTER — Other Ambulatory Visit: Payer: Self-pay | Admitting: Internal Medicine

## 2019-01-14 ENCOUNTER — Ambulatory Visit (INDEPENDENT_AMBULATORY_CARE_PROVIDER_SITE_OTHER): Payer: Medicare Other | Admitting: Internal Medicine

## 2019-01-14 ENCOUNTER — Other Ambulatory Visit: Payer: Self-pay

## 2019-01-14 ENCOUNTER — Encounter: Payer: Self-pay | Admitting: Internal Medicine

## 2019-01-14 DIAGNOSIS — I10 Essential (primary) hypertension: Secondary | ICD-10-CM | POA: Diagnosis not present

## 2019-01-14 DIAGNOSIS — E039 Hypothyroidism, unspecified: Secondary | ICD-10-CM | POA: Diagnosis not present

## 2019-01-14 DIAGNOSIS — E119 Type 2 diabetes mellitus without complications: Secondary | ICD-10-CM

## 2019-01-14 DIAGNOSIS — E785 Hyperlipidemia, unspecified: Secondary | ICD-10-CM

## 2019-01-14 DIAGNOSIS — R9389 Abnormal findings on diagnostic imaging of other specified body structures: Secondary | ICD-10-CM

## 2019-01-14 DIAGNOSIS — K760 Fatty (change of) liver, not elsewhere classified: Secondary | ICD-10-CM | POA: Diagnosis not present

## 2019-01-14 MED ORDER — DILTIAZEM HCL ER COATED BEADS 180 MG PO CP24: 180.0000 mg | ORAL_CAPSULE | Freq: Every day | ORAL | 2 refills | Status: DC

## 2019-01-14 NOTE — Progress Notes (Signed)
Telephone Note  This visit type was conducted due to national recommendations for restrictions regarding the COVID-19 pandemic (e.g. social distancing).  This format is felt to be most appropriate for this patient at this time.  All issues noted in this document were discussed and addressed.  No physical exam was performed (except for noted visual exam findings with Video Visits).   I connected with@ on 01/14/19 at  9:30 AM EDT by a telephone and verified that I am speaking with the correct person using two identifiers. Location patient: home Location provider: work or home office Persons participating in the virtual visit: patient, provider  I discussed the limitations, risks, security and privacy concerns of performing an evaluation and management service by telephone and the availability of in person appointments. I also discussed with the patient that there may be a patient responsible charge related to this service. The patient expressed understanding and agreed to proceed.   Reason for visit: FOLLOW UP ON dyspnea, hypertension and hypothyroidism   HPI:   85 YR OLD FEMALE With TYPE 2 DM, NAFLD  , hypothyroid  Recently evaluated for dyspnea, suspicion of  ILD,  no ILD found by pulmonology,  Multifactorial causes including diastolic dysfunction pulmonary hypertension and severe sleep apnea.  rec change from amlodipine to diltiazem suggested but deferred to PCP  Feels generally well,  Awaiting CPAP to be delivered .     Patient is taking her medications as prescribed and notes no adverse effects.  Home BP readings have been done about once per week and are  generally < 130/80 .  She is avoiding added salt in her diet and not walking regularly due to dyspnea and age.    ROS: See pertinent positives and negatives per HPI.  Past Medical History:  Diagnosis Date  . Cyst    hx o on right breast  . DJD (degenerative joint disease)    right knee  . Glaucoma   . Hypertension   . Thyroid  disease     Past Surgical History:  Procedure Laterality Date  . ABDOMINAL HYSTERECTOMY    . VITRECTOMY  July 2012   right eye with membrane peel [name], [place])    Family History  Problem Relation Age of Onset  . Cancer Father     SOCIAL HX:  reports that she has never smoked. She has never used smokeless tobacco. She reports that she does not drink alcohol or use drugs.   Current Outpatient Medications:  .  albuterol (PROVENTIL) (2.5 MG/3ML) 0.083% nebulizer solution, Take 3 mLs (2.5 mg total) by nebulization every 6 (six) hours as needed for wheezing or shortness of breath., Disp: 150 mL, Rfl: 1 .  amLODipine (NORVASC) 10 MG tablet, Take 1 tablet (10 mg total) by mouth daily., Disp: 90 tablet, Rfl: 1 .  Calcium Carb-Cholecalciferol (CALCIUM 1000 + D PO), Take 1 tablet by mouth daily.  , Disp: , Rfl:  .  Cholecalciferol (VITAMIN D) 1000 UNITS capsule, Take 1,000 Units by mouth daily.  , Disp: , Rfl:  .  dorzolamide-timolol (COSOPT) 22.3-6.8 MG/ML ophthalmic solution, dorzolamide 22.3 mg-timolol 6.8 mg/mL eye drops, Disp: , Rfl:  .  fish oil-omega-3 fatty acids 1000 MG capsule, Take 2 g by mouth daily.  , Disp: , Rfl:  .  furosemide (LASIX) 20 MG tablet, TAKE ONE TABLET BY MOUTH DAILY AS NEEDEDFOR FLUID RETENTION, Disp: 30 tablet, Rfl: 0 .  levothyroxine (SYNTHROID) 25 MCG tablet, TAKE ONE TABLET BY MOUTH EVERY DAY, Disp: 90  tablet, Rfl: 2 .  diltiazem (CARDIZEM CD) 180 MG 24 hr capsule, Take 1 capsule (180 mg total) by mouth daily., Disp: 30 capsule, Rfl: 2  EXAM:   General impression: alert, cooperative and articulate.  No signs of being in distress  Lungs: speech is fluent sentence length suggests that patient is not short of breath and not punctuated by cough, sneezing or sniffing. Marland Kitchen   Psych: affect normal.  speech is articulate and non pressured .  Denies suicidal thoughts   ASSESSMENT AND PLAN:  Essential hypertension Changing medication from amlodipine to diltiazem for  management of diastolic dysfunction   Diabetes mellitus without complication Improving control with low GI diet and exercise .  hemoglobin A1c has been  <6.0. Patient is  Up to date on   eye exam and foot exam   There is  no proteinuria on prior micro urinalysis . She is opposed to statin use .   Lab Results  Component Value Date   HGBA1C 5.9 (A) 04/17/2018   Lab Results  Component Value Date   MICROALBUR 1.3 04/17/2018   Lab Results  Component Value Date   CREATININE 0.80 04/17/2018     Acquired hypothyroidism Thyroid function is WNL on current dose.  Repeat level due  Lab Results  Component Value Date   TSH 2.49 04/17/2018     Abnormal chest CT Pulmonology evaluation with PFTS and sleep study done.  There is no evidence of ILD or COPD,  But she does have severe OSA, pulmonary hypertension and restrictive lung disease by PFTs.  Will stop albuterol and proceed with trial of CPAP    Hyperlipidemia LDL goal <100 She has untreated hyperlipidemia due to patient preference to avoid statins.  She has NAFLD and would benefit from statin therapy, Vitamin E and metformin     I discussed the assessment and treatment plan with the patient. The patient was provided an opportunity to ask questions and all were answered. The patient agreed with the plan and demonstrated an understanding of the instructions.   The patient was advised to call back or seek an in-person evaluation if the symptoms worsen or if the condition fails to improve as anticipated.  I provided  25 minutes of non-face-to-face time during this encounter.   [name], MD

## 2019-01-16 NOTE — Assessment & Plan Note (Signed)
Pulmonology evaluation with PFTS and sleep study done.  There is no evidence of ILD or COPD,  But she does have severe OSA, pulmonary hypertension and restrictive lung disease by PFTs.  Will stop albuterol and proceed with trial of CPAP

## 2019-01-16 NOTE — Assessment & Plan Note (Signed)
She has untreated hyperlipidemia due to patient preference to avoid statins.  She has NAFLD and would benefit from statin therapy, Vitamin E and metformin

## 2019-01-16 NOTE — Assessment & Plan Note (Signed)
Improving control with low GI diet and exercise .  hemoglobin A1c has been  <6.0. Patient is  Up to date on   eye exam and foot exam   There is  no proteinuria on prior micro urinalysis . She is opposed to statin use .   Lab Results  Component Value Date   HGBA1C 5.9 (A) 04/17/2018   Lab Results  Component Value Date   MICROALBUR 1.3 04/17/2018   Lab Results  Component Value Date   CREATININE 0.80 04/17/2018

## 2019-01-16 NOTE — Assessment & Plan Note (Signed)
Thyroid function is WNL on current dose.  Repeat level due  Lab Results  Component Value Date   TSH 2.49 04/17/2018

## 2019-01-16 NOTE — Assessment & Plan Note (Signed)
Changing medication from amlodipine to diltiazem for management of diastolic dysfunction

## 2019-02-11 ENCOUNTER — Other Ambulatory Visit: Payer: Medicare Other

## 2019-02-23 ENCOUNTER — Other Ambulatory Visit: Payer: Self-pay | Admitting: Internal Medicine

## 2019-02-24 ENCOUNTER — Other Ambulatory Visit: Payer: Self-pay

## 2019-02-24 ENCOUNTER — Other Ambulatory Visit (INDEPENDENT_AMBULATORY_CARE_PROVIDER_SITE_OTHER): Payer: Medicare Other

## 2019-02-24 DIAGNOSIS — E119 Type 2 diabetes mellitus without complications: Secondary | ICD-10-CM | POA: Diagnosis not present

## 2019-02-24 DIAGNOSIS — E785 Hyperlipidemia, unspecified: Secondary | ICD-10-CM | POA: Diagnosis not present

## 2019-02-24 DIAGNOSIS — E039 Hypothyroidism, unspecified: Secondary | ICD-10-CM

## 2019-02-24 DIAGNOSIS — K760 Fatty (change of) liver, not elsewhere classified: Secondary | ICD-10-CM | POA: Diagnosis not present

## 2019-02-24 LAB — COMPREHENSIVE METABOLIC PANEL
ALT: 49 U/L — ABNORMAL HIGH (ref 0–35)
AST: 50 U/L — ABNORMAL HIGH (ref 0–37)
Albumin: 3.8 g/dL (ref 3.5–5.2)
Alkaline Phosphatase: 269 U/L — ABNORMAL HIGH (ref 39–117)
BUN: 9 mg/dL (ref 6–23)
CO2: 28 mEq/L (ref 19–32)
Calcium: 9.7 mg/dL (ref 8.4–10.5)
Chloride: 103 mEq/L (ref 96–112)
Creatinine, Ser: 0.58 mg/dL (ref 0.40–1.20)
GFR: 98.88 mL/min (ref >60.00)
Glucose, Bld: 118 mg/dL — ABNORMAL HIGH (ref 70–99)
Potassium: 4.5 mEq/L (ref 3.5–5.1)
Sodium: 139 mEq/L (ref 135–145)
Total Bilirubin: 0.7 mg/dL (ref 0.2–1.2)
Total Protein: 7.3 g/dL (ref 6.0–8.3)

## 2019-02-24 LAB — TSH: TSH: <0.01 u[IU]/mL — ABNORMAL LOW (ref 0.35–4.50)

## 2019-02-24 LAB — LIPID PANEL
Cholesterol: 144 mg/dL (ref 0–200)
HDL: 32.3 mg/dL — ABNORMAL LOW (ref >39.00)
LDL Cholesterol: 90 mg/dL (ref 0–99)
NonHDL: 111.36
Total CHOL/HDL Ratio: 4
Triglycerides: 106 mg/dL (ref 0.0–149.0)
VLDL: 21.2 mg/dL (ref 0.0–40.0)

## 2019-02-24 LAB — HEMOGLOBIN A1C: Hgb A1c MFr Bld: 6 % (ref 4.6–6.5)

## 2019-02-24 LAB — MICROALBUMIN / CREATININE URINE RATIO
Creatinine,U: 61.4 mg/dL
Microalb Creat Ratio: 1.6 mg/g (ref 0.0–30.0)
Microalb, Ur: 1 mg/dL (ref 0.0–1.9)

## 2019-03-02 ENCOUNTER — Other Ambulatory Visit: Payer: Self-pay | Admitting: Internal Medicine

## 2019-03-02 DIAGNOSIS — E0590 Thyrotoxicosis, unspecified without thyrotoxic crisis or storm: Secondary | ICD-10-CM

## 2019-03-02 DIAGNOSIS — E039 Hypothyroidism, unspecified: Secondary | ICD-10-CM

## 2019-03-06 ENCOUNTER — Other Ambulatory Visit: Payer: Self-pay | Admitting: Internal Medicine

## 2019-03-06 DIAGNOSIS — K760 Fatty (change of) liver, not elsewhere classified: Secondary | ICD-10-CM

## 2019-03-06 NOTE — Progress Notes (Signed)
Referral to local GI in process .  Fatty liver diagnosed in 2016 by [facility]

## 2019-03-10 ENCOUNTER — Other Ambulatory Visit: Payer: Self-pay

## 2019-03-10 ENCOUNTER — Telehealth: Payer: Self-pay | Admitting: *Deleted

## 2019-03-10 ENCOUNTER — Other Ambulatory Visit (INDEPENDENT_AMBULATORY_CARE_PROVIDER_SITE_OTHER): Payer: Medicare Other

## 2019-03-10 DIAGNOSIS — E0590 Thyrotoxicosis, unspecified without thyrotoxic crisis or storm: Secondary | ICD-10-CM

## 2019-03-10 DIAGNOSIS — E039 Hypothyroidism, unspecified: Secondary | ICD-10-CM

## 2019-03-10 DIAGNOSIS — K760 Fatty (change of) liver, not elsewhere classified: Secondary | ICD-10-CM

## 2019-03-10 LAB — SARS-COV-2 IGG: SARS-COV-2 IgG: 0.02

## 2019-03-10 NOTE — Telephone Encounter (Signed)
Lauren Morris (Patient) Lauren Morris (Patient) Referral - Request for Referral  Summary: Referral  Has patient seen PCP for this complaint? Yes  *If NO, is insurance requiring patient see PCP for this issue before PCP can refer them?  Referral for which specialty: Gastroenterology   Preferred provider/office: Dr. Kris Hartmann, [place] [address] Fax: [phone]  Reason for referral: Liver complications

## 2019-03-10 NOTE — Telephone Encounter (Signed)
Copied from [place] [phone] [phone]. Topic: Referral - Request for Referral >> Mar 10, 2019 12:40 PM Erick Blinks wrote: Has patient seen PCP for this complaint? Yes *If NO, is insurance requiring patient see PCP for this issue before PCP can refer them? Referral for which specialty: Gastroenterology  Preferred provider/office: Dr. Kris Hartmann, [place] [address] Fax: [phone] Reason for referral: Liver complications

## 2019-03-11 ENCOUNTER — Other Ambulatory Visit: Payer: Self-pay | Admitting: Internal Medicine

## 2019-03-11 DIAGNOSIS — E0590 Thyrotoxicosis, unspecified without thyrotoxic crisis or storm: Secondary | ICD-10-CM

## 2019-03-11 LAB — THYROID PEROXIDASE ANTIBODY: Thyroperoxidase Ab SerPl-aCnc: 5 IU/mL (ref <9)

## 2019-03-11 LAB — THYROID PANEL WITH TSH
Free Thyroxine Index: 3.4 (ref 1.4–3.8)
T3 Uptake: 31 % (ref 22–35)
T4, Total: 11.1 ug/dL (ref 5.1–11.9)
TSH: <0.01 mIU/L — ABNORMAL LOW (ref 0.40–4.50)

## 2019-03-11 LAB — THYROGLOBULIN LEVEL: Thyroglobulin: 42.4 ng/mL — ABNORMAL HIGH

## 2019-03-11 LAB — THYROGLOBULIN ANTIBODY: Thyroglobulin Ab: <1 IU/mL (ref <=1)

## 2019-03-11 NOTE — Telephone Encounter (Signed)
GI referral to [facility] in progress

## 2019-04-03 DIAGNOSIS — Z23 Encounter for immunization: Secondary | ICD-10-CM | POA: Diagnosis not present

## 2019-04-09 DIAGNOSIS — H35373 Puckering of macula, bilateral: Secondary | ICD-10-CM | POA: Diagnosis not present

## 2019-04-09 DIAGNOSIS — H35341 Macular cyst, hole, or pseudohole, right eye: Secondary | ICD-10-CM | POA: Diagnosis not present

## 2019-04-09 DIAGNOSIS — H401131 Primary open-angle glaucoma, bilateral, mild stage: Secondary | ICD-10-CM | POA: Diagnosis not present

## 2019-04-13 ENCOUNTER — Other Ambulatory Visit: Payer: Self-pay

## 2019-04-13 ENCOUNTER — Other Ambulatory Visit (INDEPENDENT_AMBULATORY_CARE_PROVIDER_SITE_OTHER): Payer: Medicare Other

## 2019-04-13 DIAGNOSIS — E0590 Thyrotoxicosis, unspecified without thyrotoxic crisis or storm: Secondary | ICD-10-CM

## 2019-04-13 DIAGNOSIS — E039 Hypothyroidism, unspecified: Secondary | ICD-10-CM

## 2019-04-13 LAB — THYROID PANEL WITH TSH
Free Thyroxine Index: 2.9 (ref 1.4–3.8)
T3 Uptake: 32 % (ref 22–35)
T4, Total: 9.2 ug/dL (ref 5.1–11.9)
TSH: 0.01 mIU/L — ABNORMAL LOW (ref 0.40–4.50)

## 2019-04-14 LAB — HEPATIC FUNCTION PANEL
ALT: 54 — AB (ref 7–35)
AST: 54 — AB (ref 13–35)
Alkaline Phosphatase: 269 — AB (ref 25–125)
Bilirubin, Total: 0.7

## 2019-04-14 LAB — BASIC METABOLIC PANEL: Creatinine: 0.5 (ref 0.5–1.1)

## 2019-04-14 NOTE — Assessment & Plan Note (Signed)
Thyroid supplementation stopped for suppressed TSH  Lab Results  Component Value Date   TSH 0.01 (L) 04/13/2019

## 2019-04-17 DIAGNOSIS — R748 Abnormal levels of other serum enzymes: Secondary | ICD-10-CM | POA: Diagnosis not present

## 2019-04-17 DIAGNOSIS — R634 Abnormal weight loss: Secondary | ICD-10-CM | POA: Diagnosis not present

## 2019-04-17 DIAGNOSIS — I9589 Other hypotension: Secondary | ICD-10-CM | POA: Diagnosis not present

## 2019-04-17 DIAGNOSIS — E0580 Other thyrotoxicosis without thyrotoxic crisis or storm: Secondary | ICD-10-CM | POA: Diagnosis not present

## 2019-04-28 ENCOUNTER — Telehealth: Payer: Self-pay | Admitting: Internal Medicine

## 2019-04-28 DIAGNOSIS — E039 Hypothyroidism, unspecified: Secondary | ICD-10-CM

## 2019-04-28 DIAGNOSIS — E069 Thyroiditis, unspecified: Secondary | ICD-10-CM

## 2019-04-28 NOTE — Telephone Encounter (Signed)
Patient needs to see an endocrinologist abut her overactive thyroid . I will make the referral if she will agree to go   Also dr Earlean Shawl wanted her dose of amlodipine to be reduced if her bp is still low and she is still feeling dizzy.  Has she been feeling dizzy?  What does is shetaking and what was her last bp reading at home?

## 2019-04-30 NOTE — Telephone Encounter (Signed)
Patient requesting back from Lauren Morris to discuss further. She states she has additional questions. Pt declined appointment offer.

## 2019-04-30 NOTE — Telephone Encounter (Signed)
please schedule rn visit to check bp and orthostatic vital signs

## 2019-04-30 NOTE — Telephone Encounter (Signed)
I spoke with patient & she just had questions about who was best to see over at [facility]. I tild her that I didn't know & that everyone was great as far as I knew. After telling her this she stated that she was fine to see someone in [place] at [facility].

## 2019-05-01 NOTE — Telephone Encounter (Signed)
I have made patient appointment have BP checked as well as orthostatic vitals per request for Tuesday 11/10.

## 2019-05-04 DIAGNOSIS — R7989 Other specified abnormal findings of blood chemistry: Secondary | ICD-10-CM | POA: Diagnosis not present

## 2019-05-05 ENCOUNTER — Other Ambulatory Visit: Payer: Self-pay

## 2019-05-05 ENCOUNTER — Ambulatory Visit (INDEPENDENT_AMBULATORY_CARE_PROVIDER_SITE_OTHER): Payer: Medicare Other

## 2019-05-05 DIAGNOSIS — I10 Essential (primary) hypertension: Secondary | ICD-10-CM | POA: Diagnosis not present

## 2019-05-06 ENCOUNTER — Telehealth: Payer: Self-pay

## 2019-05-06 NOTE — Telephone Encounter (Signed)
Copied from [place] [id number]. Topic: General - Other >> May 06, 2019  2:47 PM [name], [place] C wrote: Reason for CRM: pt says that she is suppose to have a  referral to [facility],  pt says that she received a call from [facility] but she doesn't want to be seen there.   Pt would like further assistance with this, she has not received a call from [employer]

## 2019-05-06 NOTE — Progress Notes (Addendum)
Patient presented today for nurse visit BP check for orthostatics per MD order from 04/28/19.   Patient reports compliance with prescribed BP medications: YES, pt reported she is taking amlodipine.  Last dose of BP medication: Last night at 7 pm.  Patient reported that she has shortness of breath when walking.  Denies having any other symptoms.  Orthostatics Readings today:  Lying Down:  ( 9:30 am) BP 150/70  HR 94  O2 91   Sitting Up:  (9:31 am) BP 140/60  HR 97  O2 94   Standing:  (9:32 am) BP 148/70  HR 93  O2 95   Sitting Down:  (9:33 am) BP 138/58  HR 93  O2 96   Sitting Down (Resting) 9:35 am: BP 122/50  HR 87  O2 95    Dorise Bullion, CMA   Reviewed above readings and recent notes.  Was asked to come in to confirm not orthostatic on exam.  Reviewed above readings.  Not orthostatic on exam.  After resting, blood pressure improves.  Per note, pt states taking amlodipine.  Per recent note, question if change to diltiazem.  Will need to clarify.  Hold on changing medication.  Was recently evaluated by pulmonary for the sob.  Forward to Dr Derrel Nip for review and follow up.    Dr Nicki Reaper

## 2019-05-06 NOTE — Progress Notes (Signed)
I signed off on note.  Please clarify medication pt is currently taking.  Please confirm on amlodipine.  Forward to Dr Derrel Nip for f/u.

## 2019-05-07 NOTE — Telephone Encounter (Signed)
The referral did not specify who she wanted to see. Referral sent to [facility]

## 2019-05-15 ENCOUNTER — Telehealth: Payer: Self-pay

## 2019-05-15 NOTE — Progress Notes (Addendum)
Called and spoke to patient to confirm medication. Pt said that she is taking amlodipine once daily at night.  Pt said that she is not taking diltiazem (Cardizem CD).

## 2019-05-15 NOTE — Telephone Encounter (Signed)
I see where the referral has been submitted but the pt stated that she has not heard anything from their office. Referral was placed on 04/30/2019.

## 2019-05-15 NOTE — Telephone Encounter (Signed)
Patient wants to follow up about status of a referral to Dr. Cruzita Lederer, endocrinologist at [facility] in [place].    Pt said that Dr. Derrel Nip stopped the levothyroxine med and was supposed to send in a referral to endo.  Please advise.

## 2019-05-26 ENCOUNTER — Other Ambulatory Visit: Payer: Self-pay

## 2019-05-28 ENCOUNTER — Other Ambulatory Visit: Payer: Self-pay | Admitting: Internal Medicine

## 2019-05-28 ENCOUNTER — Other Ambulatory Visit (INDEPENDENT_AMBULATORY_CARE_PROVIDER_SITE_OTHER): Payer: Medicare Other

## 2019-05-28 ENCOUNTER — Other Ambulatory Visit: Payer: Self-pay

## 2019-05-28 DIAGNOSIS — E039 Hypothyroidism, unspecified: Secondary | ICD-10-CM

## 2019-05-29 LAB — THYROID PANEL WITH TSH
Free Thyroxine Index: 3.4 (ref 1.4–3.8)
T3 Uptake: 33 % (ref 22–35)
T4, Total: 10.4 ug/dL (ref 5.1–11.9)
TSH: 0.01 mIU/L — ABNORMAL LOW (ref 0.40–4.50)

## 2019-06-02 ENCOUNTER — Other Ambulatory Visit: Payer: Self-pay | Admitting: Internal Medicine

## 2019-06-02 DIAGNOSIS — E069 Thyroiditis, unspecified: Secondary | ICD-10-CM

## 2019-06-02 DIAGNOSIS — E039 Hypothyroidism, unspecified: Secondary | ICD-10-CM

## 2019-06-02 NOTE — Progress Notes (Signed)
amb ref to dr Renne Crigler in process for new onset thyroiditis

## 2019-06-03 DIAGNOSIS — M545 Low back pain: Secondary | ICD-10-CM | POA: Diagnosis not present

## 2019-06-03 DIAGNOSIS — M5136 Other intervertebral disc degeneration, lumbar region: Secondary | ICD-10-CM | POA: Diagnosis not present

## 2019-06-03 DIAGNOSIS — M5416 Radiculopathy, lumbar region: Secondary | ICD-10-CM | POA: Diagnosis not present

## 2019-06-22 ENCOUNTER — Other Ambulatory Visit: Payer: Self-pay

## 2019-06-23 ENCOUNTER — Encounter: Payer: Self-pay | Admitting: Internal Medicine

## 2019-06-23 ENCOUNTER — Ambulatory Visit (INDEPENDENT_AMBULATORY_CARE_PROVIDER_SITE_OTHER): Payer: Medicare Other | Admitting: Internal Medicine

## 2019-06-23 VITALS — BP 150/60 | HR 94 | Ht 62.0 in | Wt 159.0 lb

## 2019-06-23 DIAGNOSIS — E0590 Thyrotoxicosis, unspecified without thyrotoxic crisis or storm: Secondary | ICD-10-CM

## 2019-06-23 DIAGNOSIS — E039 Hypothyroidism, unspecified: Secondary | ICD-10-CM

## 2019-06-23 LAB — T4, FREE: Free T4: 1.39 ng/dL (ref 0.60–1.60)

## 2019-06-23 LAB — T3, FREE: T3, Free: 4.2 pg/mL (ref 2.3–4.2)

## 2019-06-23 LAB — TSH: TSH: <0.01 u[IU]/mL — ABNORMAL LOW (ref 0.35–4.50)

## 2019-06-23 MED ORDER — ATENOLOL 25 MG PO TABS: 25.0000 mg | ORAL_TABLET | Freq: Every day | ORAL | 5 refills | Status: DC

## 2019-06-23 NOTE — Patient Instructions (Addendum)
Please stop at the lab.  Please start Atenolol 25 mg daily in am.  Please come back for a follow-up appointment in 3-4 months.   Hyperthyroidism  Hyperthyroidism is when the thyroid gland is too active (overactive). The thyroid gland is a small gland located in the lower front part of the neck, just in front of the windpipe (trachea). This gland makes hormones that help control how the body uses food for energy (metabolism) as well as how the heart and brain function. These hormones also play a role in keeping your bones strong. When the thyroid is overactive, it produces too much of a hormone called thyroxine. What are the causes? This condition may be caused by:  Graves' disease. This is a disorder in which the body's disease-fighting system (immune system) attacks the thyroid gland. This is the most common cause.  Inflammation of the thyroid gland.  A tumor in the thyroid gland.  Use of certain medicines, including: ? Prescription thyroid hormone replacement. ? Herbal supplements that mimic thyroid hormones. ? Amiodarone therapy.  Solid or fluid-filled lumps within your thyroid gland (thyroid nodules).  Taking in a large amount of iodine from foods or medicines. What increases the risk? You are more likely to develop this condition if:  You are female.  You have a family history of thyroid conditions.  You smoke tobacco.  You use a medicine called lithium.  You take medicines that affect the immune system (immunosuppressants). What are the signs or symptoms? Symptoms of this condition include:  Nervousness.  Inability to tolerate heat.  Unexplained weight loss.  Diarrhea.  Change in the texture of hair or skin.  Heart skipping beats or making extra beats.  Rapid heart rate.  Loss of menstruation.  Shaky hands.  Fatigue.  Restlessness.  Sleep problems.  Enlarged thyroid gland or a lump in the thyroid (nodule). You may also have symptoms of Graves'  disease, which may include:  Protruding eyes.  Dry eyes.  Red or swollen eyes.  Problems with vision. How is this diagnosed? This condition may be diagnosed based on:  Your symptoms and medical history.  A physical exam.  Blood tests.  Thyroid ultrasound. This test involves using sound waves to produce images of the thyroid gland.  A thyroid scan. A radioactive substance is injected into a vein, and images show how much iodine is present in the thyroid.  Radioactive iodine uptake test (RAIU). A small amount of radioactive iodine is given by mouth to see how much iodine the thyroid absorbs after a certain amount of time. How is this treated? Treatment depends on the cause and severity of the condition. Treatment may include:  Medicines to reduce the amount of thyroid hormone your body makes.  Radioactive iodine treatment (radioiodine therapy). This involves swallowing a small dose of radioactive iodine, in capsule or liquid form, to kill thyroid cells.  Surgery to remove part or all of your thyroid gland. You may need to take thyroid hormone replacement medicine for the rest of your life after thyroid surgery.  Medicines to help manage your symptoms. Follow these instructions at home:   Take over-the-counter and prescription medicines only as told by your health care provider.  Do not use any products that contain nicotine or tobacco, such as cigarettes and e-cigarettes. If you need help quitting, ask your health care provider.  Follow any instructions from your health care provider about diet. You may be instructed to limit foods that contain iodine.  Keep all follow-up  visits as told by your health care provider. This is important. ? You will need to have blood tests regularly so that your health care provider can monitor your condition. Contact a health care provider if:  Your symptoms do not get better with treatment.  You have a fever.  You are taking thyroid  hormone replacement medicine and you: ? Have symptoms of depression. ? Feel like you are tired all the time. ? Gain weight. Get help right away if:  You have chest pain.  You have decreased alertness or a change in your awareness.  You have abdominal pain.  You feel dizzy.  You have a rapid heartbeat.  You have an irregular heartbeat.  You have difficulty breathing. Summary  The thyroid gland is a small gland located in the lower front part of the neck, just in front of the windpipe (trachea).  Hyperthyroidism is when the thyroid gland is too active (overactive) and produces too much of a hormone called thyroxine.  The most common cause is Graves' disease, a disorder in which your immune system attacks the thyroid gland.  Hyperthyroidism can cause various symptoms, such as unexplained weight loss, nervousness, inability to tolerate heat, or changes in your heartbeat.  Treatment may include medicine to reduce the amount of thyroid hormone your body makes, radioiodine therapy, surgery, or medicines to manage symptoms. This information is not intended to replace advice given to you by your health care provider. Make sure you discuss any questions you have with your health care provider. Document Released: 06/11/2005 Document Revised: 05/24/2017 Document Reviewed: 05/22/2017 Elsevier Patient Education  2020 [employer].

## 2019-06-23 NOTE — Progress Notes (Signed)
Patient ID: LINDA VENTEICHER, female   DOB: 1931/10/03, 85 y.o.   MRN: [id number]   This visit occurred during the SARS-CoV-2 public health emergency.  Safety protocols were in place, including screening questions prior to the visit, additional usage of staff PPE, and extensive cleaning of exam room while observing appropriate contact time as indicated for disinfecting solutions.   HPI  KARIGAN MAHLKE is a 85 y.o.-year-old female, referred by her PCP, Dr. Derrel Nip, for management of new thyrotoxicosis, with previous history of hypothyroidism.  She is here with her daughter who offers part of the history related to her hypothyroidism, symptoms, and medications.  Pt. has been dx with hypothyroidism for "several years"  - per chart, possibly 2012 >> she was on levothyroxine 25 mcg.  However, in 02/2019, her TSH was found to be undetectably low and her levothyroxine dose was gradually reduced and finally stopped in 02/2019.  Despite this, her TSH remains low.  I reviewed pt's thyroid tests: Lab Results  Component Value Date   TSH 0.01 (L) 05/28/2019   TSH 0.01 (L) 04/13/2019   TSH <0.01 (L) 03/10/2019   TSH <0.01 (L) 02/24/2019   TSH 2.49 04/17/2018   TSH 2.11 10/16/2017   TSH 3.43 04/08/2017   TSH 2.51 10/04/2016   TSH 2.57 04/04/2016   TSH 1.51 10/05/2015   Antithyroid antibodies: 03/10/2019: TPO antibodies 5 (<9) Lab Results  Component Value Date   THGAB <1 03/10/2019   Pt describes: - weight loss - ~20 lbs in last 10 mo - fatigue - SOB with exertion - hair loss  She denies - cold intolerance - depression - constipation - dry skin  Pt denies feeling nodules in neck, hoarseness, dysphagia/odynophagia, SOB with lying down.  She has no FH of thyroid disorders in. No FH of thyroid cancer.  No h/o radiation tx to head or neck. No recent use of iodine supplements.  Pt. also has a history of OSA >> on a CPAP. She has a dry mouth and has phlegm.  ROS: Constitutional: + weight loss, +  fatigue, no subjective hyperthermia/hypothermia, + nocturia, + poor sleep Eyes: no blurry vision, no xerophthalmia ENT: no sore throat, no nodules palpated in throat, no dysphagia/odynophagia, no hoarseness Cardiovascular: no CP/+ SOB/no palpitations/+ leg swelling Respiratory: no cough/+ SOB Gastrointestinal: no N/V/D/C Musculoskeletal: no muscle/+ joint aches Skin: no rashes, + hair loss, + easy bruising Neurological: no tremors/numbness/tingling/dizziness Psychiatric: no depression/anxiety  Past Medical History:  Diagnosis Date  . Cyst    hx o on right breast  . DJD (degenerative joint disease)    right knee  . Glaucoma   . Hypertension   . Thyroid disease    Past Surgical History:  Procedure Laterality Date  . ABDOMINAL HYSTERECTOMY    . VITRECTOMY  July 2012   right eye with membrane peel [name], [place])   Social History   Socioeconomic History  . Marital status: Widowed    Spouse name: Not on file  . Number of children: 1  . Years of education: Not on file  . Highest education level: Not on file  Occupational History  .  Homemaker  Tobacco Use  . Smoking status: Never Smoker  . Smokeless tobacco: Never Used  Substance and Sexual Activity  . Alcohol use: No  . Drug use: No  . Sexual activity: Not on file  Other Topics Concern  . Not on file  Social History Narrative  . Not on file   Social Determinants of Health  Financial Resource Strain:   . Difficulty of Paying Living Expenses: Not on file  Food Insecurity:   . Worried About Charity fundraiser in the Last Year: Not on file  . Ran Out of Food in the Last Year: Not on file  Transportation Needs:   . Lack of Transportation (Medical): Not on file  . Lack of Transportation (Non-Medical): Not on file  Physical Activity:   . Days of Exercise per Week: Not on file  . Minutes of Exercise per Session: Not on file  Stress:   . Feeling of Stress : Not on file  Social Connections:   . Frequency of  Communication with Friends and Family: Not on file  . Frequency of Social Gatherings with Friends and Family: Not on file  . Attends Religious Services: Not on file  . Active Member of Clubs or Organizations: Not on file  . Attends Archivist Meetings: Not on file  . Marital Status: Not on file  Intimate Partner Violence:   . Fear of Current or Ex-Partner: Not on file  . Emotionally Abused: Not on file  . Physically Abused: Not on file  . Sexually Abused: Not on file   On: [place] + Current Outpatient Medications on File Prior to Visit  Medication Sig Dispense Refill  . amLODipine (NORVASC) 10 MG tablet Take 1 tablet (10 mg total) by mouth daily. 90 tablet 1  . Calcium Carb-Cholecalciferol (CALCIUM 1000 + D PO) Take 1 tablet by mouth daily.      . Cholecalciferol (VITAMIN D) 1000 UNITS capsule Take 1,000 Units by mouth daily.      . dorzolamide-timolol (COSOPT) 22.3-6.8 MG/ML ophthalmic solution dorzolamide 22.3 mg-timolol 6.8 mg/mL eye drops    . fish oil-omega-3 fatty acids 1000 MG capsule Take 2 g by mouth daily.      Marland Kitchen albuterol (PROVENTIL) (2.5 MG/3ML) 0.083% nebulizer solution Take 3 mLs (2.5 mg total) by nebulization every 6 (six) hours as needed for wheezing or shortness of breath. (Patient not taking: Reported on 06/23/2019) 150 mL 1  . diltiazem (CARDIZEM CD) 180 MG 24 hr capsule Take 1 capsule (180 mg total) by mouth daily. (Patient not taking: Reported on 06/23/2019) 30 capsule 2  . furosemide (LASIX) 20 MG tablet TAKE ONE TABLET BY MOUTH DAILY AS NEEDEDFOR FLUID RETENTION (Patient not taking: Reported on 06/23/2019) 30 tablet 0   No current facility-administered medications on file prior to visit.   No Known Allergies Family History  Problem Relation Age of Onset  . Cancer Father   Also, HTN mother and father, heart disease in mother.  PE: BP (!) 150/60   Pulse 94   Ht 5\' 2"  (1.575 m)   Wt 159 lb (72.1 kg)   SpO2 96%   BMI 29.08 kg/m  Wt Readings  from Last 3 Encounters:  06/23/19 159 lb (72.1 kg)  08/14/18 183 lb 12.8 oz (83.4 kg)  07/17/18 178 lb (80.7 kg)   Constitutional: overweight, in NAD Eyes: PERRLA, EOMI, no exophthalmos ENT: moist mucous membranes, no thyromegaly, no cervical lymphadenopathy Cardiovascular: tachycardia, RR, No MRG Respiratory: CTA B Gastrointestinal: abdomen soft, NT, ND, BS+ Musculoskeletal: no deformities, strength intact in all 4 Skin: moist, warm, no rashes Neurological: + Slight tremor with outstretched hands L>R, DTR normal in all 4  ASSESSMENT: 1.  New Thyrotoxicosis  2. History of hypothyroidism  PLAN:  1.  New thyrotoxicosis -Patient with history of hypothyroidism, previously on 25 mcg levothyroxine, however, with new diagnosis  of thyrotoxicosis in 02/2019, which persisted, despite stopping her levothyroxine.  Patient has a significant history of more than 20 pounds weight loss in the last 10 months.  At today's visit, she has tachycardia and complaints of shortness of breath with exertion.  No hot flashes or anxiety but she does have slight tremors with outstretched hands.  - she does not appear to have exogenous causes for the low TSH.  - I discussed with patient and her daughter that possible causes of thyrotoxicosis are:  Marland Kitchen Graves ds   . Thyroiditis . toxic multinodular goiter/ toxic adenoma (I cannot feel nodules at palpation of her thyroid). - will check the TSH, fT3 and fT4 and also add thyroid stimulating antibodies to screen for Graves' disease.  - If the tests remain abnormal, we may need an uptake and scan to differentiate between the 3 above possible etiologies  - we discussed about possible modalities of treatment for the above conditions, to include methimazole use (discussed possible side effects in case we need to start this), radioactive iodine ablation.  Surgery is also a possibility, but I would try to avoid this in her due to age. - we may need to do thyroid ultrasound  depending on the results of the uptake and scan (if a cold nodule is present) - I suggested to add a beta blocker since she is tachycardic. - no signs of Graves' ophthalmopathy: she does not have double vision, blurry vision, eye pain, chemosis. - I will see her back in 3-4 months, but likely sooner for repeat labs  2.  History of hypothyroidism - She was on the very low dose of levothyroxine in the past, 25 mcg daily - She has been off since 02/2019 - We discussed that if she does turn out to have thyroiditis, this resolves in several months, but one of the possible consequences hypothyroidism, in which case, we may need to restart levothyroxine, most likely at a higher dose  Component     Latest Ref Rng & Units 06/23/2019  TSH     0.35 - 4.50 uIU/mL <0.01 (L)  T4,Free(Direct)     0.60 - 1.60 ng/dL 1.39  Triiodothyronine,Free,Serum     2.3 - 4.2 pg/mL 4.2  TSI     <140 % baseline 167 (H)   TSH is still undetectable and TSI antibodies are slightly elevated.  The slight elevation of TSI can be related to either mild Graves' disease or thyroiditis.  My suspicion is for Graves' disease due to her weight loss over the last 10 months so I would suggest to add a low-dose methimazole, 5 mg daily, and check her TFTs in 5 weeks.  However, I would also like to obtain a thyroid uptake and scan and will advise her to hold methimazole for at least 5 days before the test and resume it right afterwards.  Philemon Kingdom, MD PhD [facility]

## 2019-06-25 LAB — THYROID STIMULATING IMMUNOGLOBULIN: TSI: 167 % baseline — ABNORMAL HIGH (ref <140)

## 2019-06-29 ENCOUNTER — Telehealth: Payer: Self-pay | Admitting: Internal Medicine

## 2019-06-29 MED ORDER — METHIMAZOLE 5 MG PO TABS: 5.0000 mg | ORAL_TABLET | Freq: Every day | ORAL | 3 refills | Status: DC

## 2019-06-29 NOTE — Telephone Encounter (Signed)
Pt states that their cpap machine has not been working for the past two nights and wants to know if she can still get the Covid vaccine today or not. Please advise

## 2019-06-30 ENCOUNTER — Telehealth: Payer: Self-pay | Admitting: Internal Medicine

## 2019-06-30 NOTE — Telephone Encounter (Signed)
patient called back about her lab results and also wants to know are the results were the reason her medication is being changed?

## 2019-06-30 NOTE — Telephone Encounter (Signed)
Patient requests to be called asap at ph# [phone] re: Patient received a call from her pharmacy stating a new medication had been called in from Dr. Cruzita Lederer and it is ready for pick up. Patient does not know what medication it is and also wants to know if it is okay to take the new medication with atenolol 25 mg.

## 2019-06-30 NOTE — Telephone Encounter (Signed)
Spoke with Dr. Derrel Nip verbally and she stated that the pt can still get the covid vaccine. Pt is aware.

## 2019-06-30 NOTE — Telephone Encounter (Signed)
The results were done yesterday and I did not have a chance to call her.  Notified patient of message from Dr. Cruzita Lederer, patient expressed understanding and agreement. No further questions.   Philemon Kingdom, MD  Cardell Peach I, CMA  Oziel Beitler, can you please call pt:  TSH is still low (undetectable) and the Graves' antibodies are slightly elevated. The slight elevation of Graves' antibodies can be related to either mild Graves' disease or thyroiditis. My suspicion is for Graves' disease due to her weight loss over the last 10 months so I would suggest to add a low-dose methimazole, 5 mg daily, and check her TFTs in 5 weeks (labs are in). However, I would also like to obtain a thyroid uptake and scan (I ordered this to be done at [facility] may need telephone # to schedule it) and will need to advise her to hold methimazole for at least 5 days before the test and resume it right afterwards.

## 2019-07-02 ENCOUNTER — Telehealth: Payer: Self-pay | Admitting: Internal Medicine

## 2019-07-02 NOTE — Telephone Encounter (Signed)
Pt's daughter called back and stated that she did not see the pt fall but yesterday around 11am she had tried calling the pt several times and couldn't get the pt. She started getting worried because she still had not been able to get in touch with the pt two hours later, so she called her son and had him to go over there but he was on [place] way [place] from [place]. When he got there it was about 3 or 4 hours later and he found the pt laying on the floor beside the couch. He helped her get up and in a chair and fixed her lunch. The pt stated that she had bent over to put some pictures in a trunk and her vision went black for just a second. She decided that she would not try standing up at the trunk and risk falling so she crawled over to the couch but when she got over to the couch she could not get herself pushed up due to her hurt knee so she decided to lay in the floor. She was able to reach a pillow off the couch and she laid on her left side. The daughter stated there was no visual changes, no slurred speech, no droop in the face on either side, no extremity weakness that was abnormal. The daughter stated that she went back over there this morning to check on the pt and she stated that the left side of her face seemed swollen but the pt told her then that it was because she laid on a hard pillow yesterday for a long time. The daughter stated that her main concern for the call was because the pt had just been put on a new thyroid medicine called Methimazole and had gotten her first Covid vaccine and she was concerned that maybe she could have had a reaction to one or the other or took to much of the thyroid medicine since it was a new medicine. The daughter stated that she would keep an eye on the pt and if she noticed any changes in the pt hen she would take her to the ED since she seemed fine.

## 2019-07-02 NOTE — Telephone Encounter (Signed)
Pt stated that she bent down to put something away instead of trying to stand up because she did not have anything to grab she decided to crawl to the couch. Pt stated that this happens to her when she bends over. Pt stated to me that she does not think that she had a stroke. I tried to get in touch with the daughter but have not bee able to yet. Pt was advised after speaking with both Juliann Pulse and Dr. Derrel Nip that she should go on to the ED to be evaluated because we can not evaluate her for a stroke over the phone and there is no appts available at the office today. The pt stated that she did not want to go to the ED because of Covid and doesn't want to run the risk of catching it. I explained the importance of the pt going to the ED and that she wear her mask and keep her hands sanitized. Pt stated "okay".

## 2019-07-02 NOTE — Telephone Encounter (Signed)
AGREE,  IF daughter saw patient and was concerned about a possible CVA,  The ER is the ONLY place to go  If daughter did not see her but thinks she has been havign dizziness, she can be seen at urgent care

## 2019-07-02 NOTE — Telephone Encounter (Signed)
Pt's daughter called and is concerned that pt has had a fall or possible stroke. Pt has appt on 07/06/2019 but wants to talk to someone today to discuss. Please advise.

## 2019-07-02 NOTE — Telephone Encounter (Signed)
Given the inclement weather, she can't be seen tomorrow but she should be worked in on Monday .  She will need to have o have orthostatics , labs and an EKG .  If she has another episode over the wekend she should be evaluated in the ER

## 2019-07-03 NOTE — Telephone Encounter (Signed)
Spoke with pt and she is scheduled for Monday at 4:30pm.

## 2019-07-06 ENCOUNTER — Ambulatory Visit (INDEPENDENT_AMBULATORY_CARE_PROVIDER_SITE_OTHER): Payer: Medicare Other | Admitting: Internal Medicine

## 2019-07-06 ENCOUNTER — Other Ambulatory Visit: Payer: Self-pay

## 2019-07-06 ENCOUNTER — Encounter: Payer: Self-pay | Admitting: Internal Medicine

## 2019-07-06 VITALS — BP 132/78 | HR 75 | Temp 97.1°F | Resp 15 | Ht 62.0 in | Wt 157.2 lb

## 2019-07-06 DIAGNOSIS — R7401 Elevation of levels of liver transaminase levels: Secondary | ICD-10-CM

## 2019-07-06 DIAGNOSIS — R9431 Abnormal electrocardiogram [ECG] [EKG]: Secondary | ICD-10-CM | POA: Diagnosis not present

## 2019-07-06 DIAGNOSIS — E0500 Thyrotoxicosis with diffuse goiter without thyrotoxic crisis or storm: Secondary | ICD-10-CM

## 2019-07-06 DIAGNOSIS — K760 Fatty (change of) liver, not elsewhere classified: Secondary | ICD-10-CM

## 2019-07-06 DIAGNOSIS — I10 Essential (primary) hypertension: Secondary | ICD-10-CM

## 2019-07-06 DIAGNOSIS — R531 Weakness: Secondary | ICD-10-CM

## 2019-07-06 MED ORDER — ATENOLOL 25 MG PO TABS: 25.0000 mg | ORAL_TABLET | Freq: Two times a day (BID) | ORAL | 4 refills | Status: DC

## 2019-07-06 NOTE — Patient Instructions (Addendum)
Increase the atenolol 25 mg to two times daily (morning and evening)   Please consider getting  Life Alert   I am referring you to [facility] to have your heart checked out because your EKG was abnormal     the ultrasound  Of your liver was normal !

## 2019-07-06 NOTE — Progress Notes (Signed)
Subjective:  Patient ID: Lauren Morris, female    DOB: 1932-04-10  Age: 86 y.o. MRN: [id number]  CC: The primary encounter diagnosis was Transaminitis. Diagnoses of Fatty liver disease, nonalcoholic, Abnormal EKG, Generalized weakness, Essential hypertension, and Thyrotoxicosis due to Graves' disease were also pertinent to this visit.  HPI Lauren Morris presents for acute visit,  This visit occurred during the SARS-CoV-2 public health emergency.  Safety protocols were in place, including screening questions prior to the visit, additional usage of staff PPE, and extensive cleaning of exam room while observing appropriate contact time as indicated for disinfecting solutions.    Patient seen today after  having an episode of generalized weakness episode at home that was interpreted initially as a TIA by [name] .  Patient is accompanied by daughter today  Patient states that while bending over to put some thinggs away in a chest on the floor, she suddenly felt too  light headed  To stand up straight ,so she lowered her self to her knees on the floor.  She tried to crawl to her couch but was unable to get off her knees to sit on the couch because the coffee table was in the way.  She grabbed a pillow off the cough and laid on her left side for three hours,  When her son was finally contacted by [name], her daughter to go over to the house, where he found her awake and alert and lying on her side.  She had wet herself but states that this was due to chronic urinary incontinence/frequency.  She did not try to crawl to the bathroom because she knew she would not be able to lift herself to the commode. She  flatly denies vision changes and feeling presyncopal  She has had several medications added: on Jan 4 she  was prescribed atenolol followed by  The addition of  Methimazole for  management of  Thyrotoxicosis  .She has had a 20 lb unintentional wt loss , tachycardia,  Exertional dyspnea , hair  loss and fatigue.  She had also received the first COVID 19 vaccine several days prior to the incident.   orthostatic vital signs done in office today note a systolic drop of 18 pts (784 to 138)  , 14 pts diastolic (80 to 66) ,  And no appreciable  Change in  in pulse (patient takes atenolol)      covid shot #2 scheduled  2) patient requesting the results of the Ultrasound ordered by GI [facility] and done in November at [place] .  THE REPORT IS AVAILABLE through the Epic portal and was reviewed with her today .    Outpatient Medications Prior to Visit  Medication Sig Dispense Refill  . amLODipine (NORVASC) 10 MG tablet Take 1 tablet (10 mg total) by mouth daily. 90 tablet 1  . Calcium Carb-Cholecalciferol (CALCIUM 1000 + D PO) Take 1 tablet by mouth daily.      . dorzolamide-timolol (COSOPT) 22.3-6.8 MG/ML ophthalmic solution dorzolamide 22.3 mg-timolol 6.8 mg/mL eye drops    . fish oil-omega-3 fatty acids 1000 MG capsule Take 2 g by mouth daily.      . furosemide (LASIX) 20 MG tablet TAKE ONE TABLET BY MOUTH DAILY AS NEEDEDFOR FLUID RETENTION 30 tablet 0  . methimazole (TAPAZOLE) 5 MG tablet Take 1 tablet (5 mg total) by mouth daily. With a meal. 60 tablet 3  . atenolol (TENORMIN) 25 MG  tablet Take 1 tablet (25 mg total) by mouth daily. 45 tablet 5  . albuterol (PROVENTIL) (2.5 MG/3ML) 0.083% nebulizer solution Take 3 mLs (2.5 mg total) by nebulization every 6 (six) hours as needed for wheezing or shortness of breath. (Patient not taking: Reported on 06/23/2019) 150 mL 1  . Cholecalciferol (VITAMIN D) 1000 UNITS capsule Take 1,000 Units by mouth daily.      Marland Kitchen diltiazem (CARDIZEM CD) 180 MG 24 hr capsule Take 1 capsule (180 mg total) by mouth daily. (Patient not taking: Reported on 07/06/2019) 30 capsule 2   No facility-administered medications prior to visit.    Review of Systems;  Patient denies headache, fevers, malaise, unintentional weight loss, skin  rash, eye pain, sinus congestion and sinus pain, sore throat, dysphagia,  hemoptysis , cough, dyspnea, wheezing, chest pain, palpitations, orthopnea, edema, abdominal pain, nausea, melena, diarrhea, constipation, flank pain, dysuria, hematuria, urinary  Frequency, nocturia, numbness, tingling, seizures,  Focal weakness, Loss of consciousness,  Tremor, insomnia, depression, anxiety, and suicidal ideation.      Objective:  BP 132/78 (BP Location: Left Arm, Patient Position: Sitting, Cuff Size: Normal)   Pulse 75   Temp (!) 97.1 F (36.2 C) (Temporal)   Resp 15   Ht _0  (1.575 m)   Wt 157 lb 3.2 oz (71.3 kg)   SpO2 95%   BMI 28.75 kg/m   BP Readings from Last 3 Encounters:  07/06/19 132/78  06/23/19 (!) 150/60  08/14/18 140/68    Wt Readings from Last 3 Encounters:  07/06/19 157 lb 3.2 oz (71.3 kg)  06/23/19 159 lb (72.1 kg)  08/14/18 183 lb 12.8 oz (83.4 kg)    General appearance: alert, cooperative and appears stated age Ears: normal TM's and external ear canals both ears Throat: lips, mucosa, and tongue normal; teeth and gums normal Neck: no adenopathy, no carotid bruit, supple, symmetrical, trachea midline and thyroid not enlarged, symmetric, no tenderness/mass/nodules Back: symmetric, no curvature. ROM normal. No CVA tenderness. Lungs: clear to auscultation bilaterally Heart: regular rate and rhythm, S1, S2 normal, no murmur, click, rub or gallop Abdomen: soft, non-tender; bowel sounds normal; no masses,  no organomegaly Pulses: 2+ and symmetric Skin: Skin color, texture, turgor normal. No rashes or lesions MSK: strength 4+/5 in upper extremities proximally bilaterally.  Able to rise from seated position without use of hands.  Ability to rise improves with repetition  Lymph nodes: Cervical, supraclavicular, and axillary nodes normal.  Lab Results  Component Value Date   HGBA1C 6.0 02/24/2019   HGBA1C 5.9 (A) 04/17/2018   HGBA1C 6.4 10/16/2017    Lab Results   Component Value Date   CREATININE 0.5 04/14/2019   CREATININE 0.58 02/24/2019   CREATININE 0.80 04/17/2018    Lab Results  Component Value Date   WBC 11.3 (H) 06/13/2018   HGB 16.5 (H) 06/13/2018   HCT 48.4 (H) 06/13/2018   PLT 222 06/13/2018   GLUCOSE 118 (H) 02/24/2019   CHOL 144 02/24/2019   TRIG 106.0 02/24/2019   HDL 32.30 (L) 02/24/2019   LDLDIRECT 80.0 04/08/2017   LDLCALC 90 02/24/2019   ALT 54 (A) 04/14/2019   AST 54 (A) 04/14/2019   NA 139 02/24/2019   K 4.5 02/24/2019   CL 103 02/24/2019   CREATININE 0.5 04/14/2019   BUN 9 02/24/2019   CO2 28 02/24/2019   TSH <0.01 (L) 06/23/2019   HGBA1C 6.0 02/24/2019   MICROALBUR 1.0 02/24/2019    Assessment & Plan:   Problem List  Items Addressed This Visit      Unprioritized   Abnormal EKG    Given her recent episode of generalized weakness, and EKG was done today, which I have reviewed.  Although she is in sinus rhythm ,  She has small q waves in the inferior leads and TWI in the septal leads.  I have recommended referral to [facility] cardiology for further evaluation.       Essential hypertension    She is mildly orthostatic but hypertensive at baseline.  Will increase the atenolol to bid.       Relevant Medications   atenolol (TENORMIN) 25 MG tablet   Fatty liver disease, nonalcoholic    With persistent transaminitis and alk phos elevation resulting in  referral to GI.  Recent ultrasound ordered by Dr Earlean Shawl and performed at [facility] in November was not reported to patient.  Report was reviewed with patient today,  And was normal.  However her 5 nucelotidase was elevated at 98 , ruling in liver source of alk phos elevation.  She has no symptoms of cirrhosis and no signs of cirrhosis or cholestasis on ultrasound  Lab Results  Component Value Date   ALT 49 (H) 02/24/2019   AST 50 (H) 02/24/2019   ALKPHOS 269 (H) 02/24/2019   BILITOT 0.7 02/24/2019         Generalized weakness    Unclear if this was due to an AMI   (see EKG,  No previous to report) since she denied chest pain ,  Due to thyrotoxicosis , or deconditioning.  She has declined PT .  Cardiology referral for abnormal EKG in progress       Thyrotoxicosis due to Graves' disease    Recently diagnosed by Dr Renne Crigler,  She has been taking methimazole 5 mg and atenolol 25 mg since Jan 11.  Repeat labs have been ordered  Atenolol dose increased to bid for concurrent management of hypertension       Relevant Medications   atenolol (TENORMIN) 25 MG tablet    Other Visit Diagnoses    Transaminitis    -  Primary   Relevant Orders   Comprehensive metabolic panel      I have discontinued Anavi M. Guymon's Vitamin D, albuterol, and diltiazem. I have also changed her atenolol. Additionally, I am having her maintain her Calcium Carb-Cholecalciferol (CALCIUM 1000 + D PO), fish oil-omega-3 fatty acids, dorzolamide-timolol, furosemide, amLODipine, and methimazole.  Meds ordered this encounter  Medications  . atenolol (TENORMIN) 25 MG tablet    Sig: Take 1 tablet (25 mg total) by mouth 2 (two) times daily.    Dispense:  60 tablet    Refill:  4    KEEP ON FILE FOR FUTURE REFILLS    Medications Discontinued During This Encounter  Medication Reason  . albuterol (PROVENTIL) (2.5 MG/3ML) 0.083% nebulizer solution Patient has not taken in last 30 days  . Cholecalciferol (VITAMIN D) 1000 UNITS capsule Patient has not taken in last 30 days  . diltiazem (CARDIZEM CD) 180 MG 24 hr capsule   . atenolol (TENORMIN) 25 MG tablet     Follow-up: No follow-ups on file.   [name], MD

## 2019-07-07 DIAGNOSIS — R9431 Abnormal electrocardiogram [ECG] [EKG]: Secondary | ICD-10-CM | POA: Insufficient documentation

## 2019-07-07 DIAGNOSIS — Z8639 Personal history of other endocrine, nutritional and metabolic disease: Secondary | ICD-10-CM | POA: Insufficient documentation

## 2019-07-07 DIAGNOSIS — R531 Weakness: Secondary | ICD-10-CM | POA: Insufficient documentation

## 2019-07-07 DIAGNOSIS — E0500 Thyrotoxicosis with diffuse goiter without thyrotoxic crisis or storm: Secondary | ICD-10-CM | POA: Insufficient documentation

## 2019-07-07 NOTE — Assessment & Plan Note (Signed)
She is mildly orthostatic but hypertensive at baseline.  Will increase the atenolol to bid.

## 2019-07-07 NOTE — Assessment & Plan Note (Addendum)
With persistent transaminitis and alk phos elevation resulting in  referral to GI.  Recent ultrasound ordered by Dr Earlean Shawl and performed at [facility] in November was not reported to patient.  Report was reviewed with patient today,  And was normal.  However her 5 nucelotidase was elevated at 98 , ruling in liver source of alk phos elevation.  She has no symptoms of cirrhosis and no signs of cirrhosis or cholestasis on ultrasound  Lab Results  Component Value Date   ALT 49 (H) 02/24/2019   AST 50 (H) 02/24/2019   ALKPHOS 269 (H) 02/24/2019   BILITOT 0.7 02/24/2019

## 2019-07-07 NOTE — Assessment & Plan Note (Signed)
Unclear if this was due to an AMI  (see EKG,  No previous to report) since she denied chest pain ,  Due to thyrotoxicosis , or deconditioning.  She has declined PT .  Cardiology referral for abnormal EKG in progress

## 2019-07-07 NOTE — Assessment & Plan Note (Signed)
Recently diagnosed by Dr Renne Crigler,  She has been taking methimazole 5 mg and atenolol 25 mg since Jan 11.  Repeat labs have been ordered  Atenolol dose increased to bid for concurrent management of hypertension

## 2019-07-07 NOTE — Assessment & Plan Note (Signed)
Given her recent episode of generalized weakness, and EKG was done today, which I have reviewed.  Although she is in sinus rhythm ,  She has small q waves in the inferior leads and TWI in the septal leads.  I have recommended referral to [facility] cardiology for further evaluation.

## 2019-09-03 ENCOUNTER — Ambulatory Visit (HOSPITAL_COMMUNITY): Admission: RE | Admit: 2019-09-03 | Payer: Medicare Other | Source: Ambulatory Visit

## 2019-09-03 ENCOUNTER — Ambulatory Visit (HOSPITAL_COMMUNITY): Payer: Medicare Other

## 2019-09-04 ENCOUNTER — Ambulatory Visit (HOSPITAL_COMMUNITY): Payer: Medicare Other

## 2019-09-16 ENCOUNTER — Encounter (HOSPITAL_COMMUNITY)
Admission: RE | Admit: 2019-09-16 | Discharge: 2019-09-16 | Disposition: A | Discharge status: Home / Self Care | Payer: Medicare Other | Source: Ambulatory Visit | Attending: Internal Medicine | Admitting: Internal Medicine

## 2019-09-16 ENCOUNTER — Other Ambulatory Visit: Payer: Self-pay

## 2019-09-16 ENCOUNTER — Ambulatory Visit (HOSPITAL_COMMUNITY)
Admission: RE | Admit: 2019-09-16 | Discharge: 2019-09-16 | Disposition: A | Discharge status: Home / Self Care | Payer: Medicare Other | Source: Ambulatory Visit | Attending: Internal Medicine | Admitting: Internal Medicine

## 2019-09-16 DIAGNOSIS — E0590 Thyrotoxicosis, unspecified without thyrotoxic crisis or storm: Secondary | ICD-10-CM | POA: Diagnosis not present

## 2019-09-16 MED ORDER — SODIUM IODIDE I-123 7.4 MBQ CAPS
444.9000 | ORAL_CAPSULE | Freq: Once | ORAL | Status: AC
  Administered 2019-09-16: 09:00:00 444.9 via ORAL

## 2019-09-17 ENCOUNTER — Ambulatory Visit (HOSPITAL_COMMUNITY)
Admission: RE | Admit: 2019-09-17 | Discharge: 2019-09-17 | Disposition: A | Discharge status: Home / Self Care | Payer: Medicare Other | Source: Ambulatory Visit | Attending: Internal Medicine | Admitting: Internal Medicine

## 2019-09-17 DIAGNOSIS — E0590 Thyrotoxicosis, unspecified without thyrotoxic crisis or storm: Secondary | ICD-10-CM | POA: Diagnosis not present

## 2019-09-18 ENCOUNTER — Telehealth: Payer: Self-pay

## 2019-09-18 NOTE — Telephone Encounter (Signed)
-----   Message from Philemon Kingdom, MD sent at 09/17/2019  3:57 PM EDT ----- Lenna Sciara, can you please call pt: The results of her thyroid uptake and scan are back and they show mild Graves' disease.  She is on treatment for this, with methimazole, starting in December.  However, she did not return for repeat set of labs after we started this medication.  Can you please have her come to the lab?  I see she has an appointment with me in another month in few days.

## 2019-09-21 NOTE — Telephone Encounter (Signed)
Notified patient of message from Dr. Cruzita Lederer, patient expressed understanding and agreement. No further questions.  Patient prefer to wait and have labs when she comes in next month.

## 2019-09-30 ENCOUNTER — Other Ambulatory Visit: Payer: Self-pay | Admitting: Internal Medicine

## 2019-10-08 ENCOUNTER — Telehealth: Payer: Self-pay | Admitting: Internal Medicine

## 2019-10-08 NOTE — Telephone Encounter (Signed)
Left message for patient to call back and schedule Medicare Annual Wellness Visit (AWV) either virtually or audio only.  Last AWV 10/07/14; please schedule at anytime with Denisa O'Brien-Blaney at [facility].

## 2019-10-20 ENCOUNTER — Other Ambulatory Visit: Payer: Self-pay

## 2019-10-21 DIAGNOSIS — D3131 Benign neoplasm of right choroid: Secondary | ICD-10-CM | POA: Diagnosis not present

## 2019-10-21 DIAGNOSIS — H401131 Primary open-angle glaucoma, bilateral, mild stage: Secondary | ICD-10-CM | POA: Diagnosis not present

## 2019-10-21 DIAGNOSIS — H35373 Puckering of macula, bilateral: Secondary | ICD-10-CM | POA: Diagnosis not present

## 2019-10-21 DIAGNOSIS — H35341 Macular cyst, hole, or pseudohole, right eye: Secondary | ICD-10-CM | POA: Diagnosis not present

## 2019-10-22 ENCOUNTER — Ambulatory Visit (INDEPENDENT_AMBULATORY_CARE_PROVIDER_SITE_OTHER): Payer: Medicare Other | Admitting: Internal Medicine

## 2019-10-22 ENCOUNTER — Other Ambulatory Visit: Payer: Self-pay

## 2019-10-22 ENCOUNTER — Encounter: Payer: Self-pay | Admitting: Internal Medicine

## 2019-10-22 VITALS — BP 126/72 | HR 64 | Ht 61.5 in | Wt 165.0 lb

## 2019-10-22 DIAGNOSIS — E0590 Thyrotoxicosis, unspecified without thyrotoxic crisis or storm: Secondary | ICD-10-CM

## 2019-10-22 DIAGNOSIS — R7401 Elevation of levels of liver transaminase levels: Secondary | ICD-10-CM | POA: Diagnosis not present

## 2019-10-22 DIAGNOSIS — E0500 Thyrotoxicosis with diffuse goiter without thyrotoxic crisis or storm: Secondary | ICD-10-CM

## 2019-10-22 DIAGNOSIS — E039 Hypothyroidism, unspecified: Secondary | ICD-10-CM | POA: Diagnosis not present

## 2019-10-22 LAB — T3, FREE: T3, Free: 2.9 pg/mL (ref 2.3–4.2)

## 2019-10-22 LAB — COMPREHENSIVE METABOLIC PANEL
ALT: 67 U/L — ABNORMAL HIGH (ref 0–35)
AST: 56 U/L — ABNORMAL HIGH (ref 0–37)
Albumin: 3.9 g/dL (ref 3.5–5.2)
Alkaline Phosphatase: 455 U/L — ABNORMAL HIGH (ref 39–117)
BUN: 14 mg/dL (ref 6–23)
CO2: 27 mEq/L (ref 19–32)
Calcium: 9.4 mg/dL (ref 8.4–10.5)
Chloride: 103 mEq/L (ref 96–112)
Creatinine, Ser: 0.68 mg/dL (ref 0.40–1.20)
GFR: 82.17 mL/min (ref >60.00)
Glucose, Bld: 111 mg/dL — ABNORMAL HIGH (ref 70–99)
Potassium: 4.6 mEq/L (ref 3.5–5.1)
Sodium: 137 mEq/L (ref 135–145)
Total Bilirubin: 0.8 mg/dL (ref 0.2–1.2)
Total Protein: 7.8 g/dL (ref 6.0–8.3)

## 2019-10-22 LAB — T4, FREE: Free T4: 0.7 ng/dL (ref 0.60–1.60)

## 2019-10-22 LAB — TSH: TSH: 6.1 u[IU]/mL — ABNORMAL HIGH (ref 0.35–4.50)

## 2019-10-22 MED ORDER — METHIMAZOLE 5 MG PO TABS: 2.5000 mg | ORAL_TABLET | Freq: Every day | ORAL | 3 refills | Status: DC

## 2019-10-22 NOTE — Patient Instructions (Signed)
Please stop at the lab.  Continue Methimazole 5 mg daily.  Please continue Atenolol 25 mg 2x a day.  Please come back for a follow-up appointment in 4 months.

## 2019-10-22 NOTE — Progress Notes (Signed)
Patient ID: Lauren Morris, female   DOB: [birth year], 86 y.o.   MRN: [id number]   This visit occurred during the SARS-CoV-2 public health emergency.  Safety protocols were in place, including screening questions prior to the visit, additional usage of staff PPE, and extensive cleaning of exam room while observing appropriate contact time as indicated for disinfecting solutions.   HPI  Lauren Morris is a 86 y.o.-year-old female, referred by her PCP, Dr. Derrel Nip, for management of Graves' disease, with previous history of hypothyroidism.  She is here with her daughter who offers part of the history related to her PMH, symptoms, medications.  Reviewed and addended history: Pt. has a history of hypothyroidism since at least 2012 >> she was on levothyroxine 25 mcg. However, in 02/2019, her TSH was found to be undetectably low and her levothyroxine dose was gradually reduced and finally stopped in 02/2019.  Despite this, her TSH remains low.  Reviewed her thyroid tests: Lab Results  Component Value Date   TSH <0.01 (L) 06/23/2019   TSH 0.01 (L) 05/28/2019   TSH 0.01 (L) 04/13/2019   TSH <0.01 (L) 03/10/2019   TSH <0.01 (L) 02/24/2019   TSH 2.49 04/17/2018   TSH 2.11 10/16/2017   TSH 3.43 04/08/2017   TSH 2.51 10/04/2016   TSH 2.57 04/04/2016   FREET4 1.39 06/23/2019   T3FREE 4.2 06/23/2019   Reviewed antithyroid antibodies -slightly elevated TSI's: 03/10/2019: TPO antibodies 5 (<9) Lab Results  Component Value Date   THGAB <1 03/10/2019   Lab Results  Component Value Date   TSI 167 (H) 06/23/2019   Thyroid uptake and scan (09/17/2019): Homogeneous tracer distribution in both thyroid lobes. No focal areas of increased or decreased tracer localization.  4 hour I-123 uptake = 15.8% (normal 5-20%) 24 hour I-123 uptake = 34.6% (normal 10-30%)  IMPRESSION: Normal thyroid scan. Elevated 24 hour radio iodine uptake consistent with hyperthyroidism. Overall findings are consistent with Graves  disease.  We started methimazole 5 mg daily at the end of 05/2019.  We also started Atenolol 25 mg daily >> Dr. Derrel Nip increased this to BID.  At last visit she described: - weight loss - ~20 lbs in last 10 mo - fatigue - SOB with exertion - hair loss  At this visit: she gained 8 lbs back, but she is still fatigue.  Pt denies: - feeling nodules in neck - hoarseness - dysphagia - choking - SOB with lying down  No FH of thyroid disease or thyroid cancer. No h/o radiation tx to head or neck.  No recent contrast studies. No herbal supplements. No Biotin use. No recent steroids use.   Pt. also has a history of OSA >> on a CPAP.   ROS: Constitutional: + weight gain/no weight loss, + fatigue, no subjective hyperthermia, no subjective hypothermia, + nocturia, + poor sleep Eyes: no blurry vision, no xerophthalmia ENT: no sore throat, + see HPI Cardiovascular: no CP/no SOB/no palpitations/+ leg swelling Respiratory: no cough/no SOB/no wheezing Gastrointestinal: no N/no V/no D/no C/no acid reflux Musculoskeletal: no muscle aches/+ joint aches Skin: no rashes, + hair loss Neurological: no tremors/no numbness/no tingling/no dizziness  I reviewed pt's medications, allergies, PMH, social hx, family hx, and changes were documented in the history of present illness. Otherwise, unchanged from my initial visit note.  Past Medical History:  Diagnosis Date  . Cyst    hx o on right breast  . DJD (degenerative joint disease)    right knee  . Glaucoma   .  Hypertension   . Thyroid disease    Past Surgical History:  Procedure Laterality Date  . ABDOMINAL HYSTERECTOMY    . VITRECTOMY  July 2012   right eye with membrane peel [name], [place])   Social History   Socioeconomic History  . Marital status: Widowed    Spouse name: Not on file  . Number of children: 1  . Years of education: Not on file  . Highest education level: Not on file  Occupational History  .  Homemaker  Tobacco  Use  . Smoking status: Never Smoker  . Smokeless tobacco: Never Used  Substance and Sexual Activity  . Alcohol use: No  . Drug use: No  . Sexual activity: Not on file  Other Topics Concern  . Not on file  Social History Narrative  . Not on file   Social Determinants of Health   Financial Resource Strain:   . Difficulty of Paying Living Expenses: Not on file  Food Insecurity:   . Worried About Charity fundraiser in the Last Year: Not on file  . Ran Out of Food in the Last Year: Not on file  Transportation Needs:   . Lack of Transportation (Medical): Not on file  . Lack of Transportation (Non-Medical): Not on file  Physical Activity:   . Days of Exercise per Week: Not on file  . Minutes of Exercise per Session: Not on file  Stress:   . Feeling of Stress : Not on file  Social Connections:   . Frequency of Communication with Friends and Family: Not on file  . Frequency of Social Gatherings with Friends and Family: Not on file  . Attends Religious Services: Not on file  . Active Member of Clubs or Organizations: Not on file  . Attends Archivist Meetings: Not on file  . Marital Status: Not on file  Intimate Partner Violence:   . Fear of Current or Ex-Partner: Not on file  . Emotionally Abused: Not on file  . Physically Abused: Not on file  . Sexually Abused: Not on file   On: [place] + Current Outpatient Medications on File Prior to Visit  Medication Sig Dispense Refill  . amLODipine (NORVASC) 10 MG tablet TAKE ONE TABLET (10 MG) BY MOUTH EVERY DAY 90 tablet 1  . atenolol (TENORMIN) 25 MG tablet Take 1 tablet (25 mg total) by mouth 2 (two) times daily. 60 tablet 4  . Calcium Carb-Cholecalciferol (CALCIUM 1000 + D PO) Take 1 tablet by mouth daily.      . dorzolamide-timolol (COSOPT) 22.3-6.8 MG/ML ophthalmic solution dorzolamide 22.3 mg-timolol 6.8 mg/mL eye drops    . fish oil-omega-3 fatty acids 1000 MG capsule Take 2 g by mouth daily.      . furosemide  (LASIX) 20 MG tablet TAKE ONE TABLET BY MOUTH DAILY AS NEEDEDFOR FLUID RETENTION 30 tablet 0  . methimazole (TAPAZOLE) 5 MG tablet Take 1 tablet (5 mg total) by mouth daily. With a meal. 60 tablet 3   No current facility-administered medications on file prior to visit.   No Known Allergies Family History  Problem Relation Age of Onset  . Cancer Father   Also, HTN mother and father, heart disease in mother.  PE: BP 126/72   Pulse 64   Ht 5' 1.5" (1.562 m) Comment: measured today without shoes  Wt 165 lb (74.8 kg)   SpO2 99%   BMI 30.67 kg/m  Wt Readings from Last 3 Encounters:  10/22/19 165 lb (74.8  kg)  07/06/19 157 lb 3.2 oz (71.3 kg)  06/23/19 159 lb (72.1 kg)   Constitutional: normal weight, in NAD Eyes: PERRLA, EOMI, no exophthalmos ENT: moist mucous membranes, no thyromegaly, no cervical lymphadenopathy Cardiovascular: RRR, No MRG Respiratory: CTA B Gastrointestinal: abdomen soft, NT, ND, BS+ Musculoskeletal: no deformities, strength intact in all 4 Skin: moist, warm, no rashes Neurological: no tremor with outstretched hands, DTR normal in all 4  ASSESSMENT: 1.  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 History of hypothyroidism  PLAN:  1.  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  She also has a significant history of weight loss of more than 20 pounds in 10 months.  At last visit she had tachycardia and was complaining of shortness of breath with exertion.  No hot flashes or anxiety but she had slight tremors with outstretched hands. -We checked her TFTs and they were in the thyrotoxic range and also her TSI level was mildly elevated.  Since TSI's can be slightly elevated in both mild Graves' disease and thyroiditis, I advised her to get a thyroid uptake and scan.   -At this visit,  we reviewed the results of her thyroid uptake and scan.  She had this on 09/17/2019 and it was consistent with Graves' disease. -We did discuss about possible modalities of treatment for Graves' disease, to include methimazole use (at last visit, we discussed possible side effects in case we need to start this), radioactive iodine ablation.  Surgery is also a possibility, but I would try to avoid this in her due to age. She would prefer to continue on MMI. -At last visit I also suggested to add a beta-blocker since she was tachycardic. Since then, her Atenolol was increased by PCP. -No signs of Graves' ophthalmopathy: No double vision, blurry vision, eye pain, chemosis -I will see her back in 4 months but most likely sooner for labs  2.  History of hypothyroidism - She was on a very low dose of levothyroxine in the past, 25 mcg daily, now off since 02/2019 - After treatment of Graves' disease, she may revert back to hypothyroidism, so we have to follow her closely  Component     Latest Ref Rng & Units 10/22/2019  TSH     0.35 - 4.50 uIU/mL 6.10 (H)  T4,Free(Direct)     0.60 - 1.60 ng/dL 0.70  Triiodothyronine,Free,Serum     2.3 - 4.2 pg/mL 2.9   TSH is now slightly high, while the free thyroid hormones are normal.  We can decrease the dose of methimazole to 2.5 mg daily and recheck her test in 1.5 months.  Philemon Kingdom, MD PhD [facility] Endocrinology

## 2019-10-27 ENCOUNTER — Telehealth: Payer: Self-pay | Admitting: Internal Medicine

## 2019-10-27 NOTE — Telephone Encounter (Signed)
A result letter was mailed to the patient.

## 2019-10-27 NOTE — Telephone Encounter (Signed)
Patient requests to be called at ph# [phone] to be given her labs results from appointment on 10/22/19

## 2019-10-29 ENCOUNTER — Other Ambulatory Visit: Payer: Self-pay

## 2019-10-29 ENCOUNTER — Ambulatory Visit
Admission: EM | Admit: 2019-10-29 | Discharge: 2019-10-29 | Disposition: A | Discharge status: Home / Self Care | Payer: Medicare Other

## 2019-10-29 ENCOUNTER — Encounter: Payer: Self-pay | Admitting: Emergency Medicine

## 2019-10-29 ENCOUNTER — Telehealth: Payer: Self-pay | Admitting: Internal Medicine

## 2019-10-29 DIAGNOSIS — L239 Allergic contact dermatitis, unspecified cause: Secondary | ICD-10-CM | POA: Diagnosis not present

## 2019-10-29 HISTORY — DX: Sleep apnea, unspecified: G47.30

## 2019-10-29 MED ORDER — TRIAMCINOLONE ACETONIDE 0.1 % EX CREA: 1.0000 "application " | TOPICAL_CREAM | Freq: Two times a day (BID) | CUTANEOUS | 0 refills | Status: DC

## 2019-10-29 NOTE — Discharge Instructions (Signed)
Use the prescribed cream as directed.    Follow up with Dr. Derrel Nip or your dermatologist if the rash is not improving.

## 2019-10-29 NOTE — ED Triage Notes (Signed)
Patient in today c/o rash/bumps on the right side of face x 2-3 days. Patient has put OTC Alcohol on the bumps.

## 2019-10-29 NOTE — Telephone Encounter (Signed)
LMTCB

## 2019-10-29 NOTE — Telephone Encounter (Signed)
Patient seen at [facility] today 

## 2019-10-29 NOTE — ED Provider Notes (Signed)
[name]    CSN: [id number] Arrival date & time: 10/29/19  1146      History   Chief Complaint Chief Complaint  Patient presents with  . Rash    HPI Lauren Morris is a 86 y.o. female.   Patient presents with a rash on her right facial cheek x 3 days. Started with 1-2 small red bumps then 2-3 more have come up.  The rash is non-pruritic, not painful, no drainage.  She denies fever, chills, sore throat, cough, or other symptoms.  No new products, foods, medications.  Treatment attempted at home with rubbing alcohol.    The history is provided by the patient.    Past Medical History:  Diagnosis Date  . Cyst    hx o on right breast  . DJD (degenerative joint disease)    right knee  . Glaucoma   . Hypertension   . Sleep apnea   . Thyroid disease     Patient Active Problem List   Diagnosis Date Noted  . Abnormal EKG 07/07/2019  . Generalized weakness 07/07/2019  . 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 Elevated hemoglobin ([place]) 06/15/2018  . Abnormal chest CT 05/25/2016  . Fatty liver disease, nonalcoholic [id number]  . Obesity 11/13/2012  . Diabetes mellitus without complication ([place]) [id number]  . Medicare annual wellness visit, subsequent 03/11/2012  . Hyperlipidemia LDL goal <100 04/15/2011  . Essential hypertension 02/22/2011  . Acquired hypothyroidism 02/22/2011    Past Surgical History:  Procedure Laterality Date  . ABDOMINAL HYSTERECTOMY    . VITRECTOMY  July 2012   right eye with membrane peel [name], [place])    OB History   No obstetric history on file.      Home Medications    Prior to Admission medications   Medication Sig Start Date End Date Taking? Authorizing Provider  amLODipine (NORVASC) 10 MG tablet TAKE ONE TABLET (10 MG) BY MOUTH EVERY DAY 09/30/19  Yes [name], MD  Ascorbic Acid (VITAMIN C) 1000 MG tablet Take 1,000 mg by mouth daily.   Yes [provider]  atenolol (TENORMIN) 25 MG tablet Take 1  tablet (25 mg total) by mouth 2 (two) times daily. 07/06/19  Yes [name], MD  Calcium Carb-Cholecalciferol (CALCIUM 1000 + D PO) Take 1 tablet by mouth daily.     Yes [provider]  dorzolamide-timolol (COSOPT) 22.3-6.8 MG/ML ophthalmic solution dorzolamide 22.3 mg-timolol 6.8 mg/mL eye drops   Yes [provider]  fish oil-omega-3 fatty acids 1000 MG capsule Take 2 g by mouth daily.     Yes [provider]  furosemide (LASIX) 20 MG tablet TAKE ONE TABLET BY MOUTH DAILY AS NEEDEDFOR FLUID RETENTION 10/03/18  Yes [name], MD  methimazole (TAPAZOLE) 5 MG tablet Take 0.5 tablets (2.5 mg total) by mouth daily. With a meal. 10/22/19  Yes Philemon Kingdom, MD  triamcinolone cream (KENALOG) 0.1 % Apply 1 application topically 2 (two) times daily. 10/29/19   Sharion Balloon, NP    Family History Family History  Problem Relation Age of Onset  . Cancer Father   . Hypertension Father   . Heart attack Mother   . Hypertension Mother     Social History Social History   Tobacco Use  . Smoking status: Never Smoker  . Smokeless tobacco: Never Used  Substance Use Topics  . Alcohol use: No  . Drug use: No     Allergies   Patient has  no known allergies.   Review of Systems Review of Systems  Constitutional: Negative for chills and fever.  HENT: Negative for ear pain and sore throat.   Eyes: Negative for pain and visual disturbance.  Respiratory: Negative for cough and shortness of breath.   Cardiovascular: Negative for chest pain and palpitations.  Gastrointestinal: Negative for abdominal pain, diarrhea, nausea and vomiting.  Genitourinary: Negative for dysuria and hematuria.  Musculoskeletal: Negative for arthralgias and back pain.  Skin: Positive for rash. Negative for color change.  Neurological: Negative for seizures and syncope.  All other systems reviewed and are negative.    Physical Exam Triage Vital Signs ED Triage Vitals  Enc Vitals  Group     BP      Pulse      Resp      Temp      Temp src      SpO2      Weight      Height      Head Circumference      Peak Flow      Pain Score      Pain Loc      Pain Edu?      Excl. in [place]?    No data found.  Updated Vital Signs BP (!) 138/57 (BP Location: Left Arm)   Pulse 62   Temp 98 F (36.7 C) (Oral)   Resp 18   Ht 5\' 1"  (1.549 m)   Wt 163 lb (73.9 kg)   SpO2 95%   BMI 30.80 kg/m   Visual Acuity Right Eye Distance:   Left Eye Distance:   Bilateral Distance:    Right Eye Near:   Left Eye Near:    Bilateral Near:     Physical Exam Vitals and nursing note reviewed.  Constitutional:      General: She is not in acute distress.    Appearance: She is well-developed.  HENT:     Head: Normocephalic and atraumatic.     Mouth/Throat:     Mouth: Mucous membranes are moist.     Pharynx: Oropharynx is clear.  Eyes:     Conjunctiva/sclera: Conjunctivae normal.  Cardiovascular:     Rate and Rhythm: Normal rate and regular rhythm.     Heart sounds: No murmur.  Pulmonary:     Effort: Pulmonary effort is normal. No respiratory distress.     Breath sounds: Normal breath sounds.  Abdominal:     Palpations: Abdomen is soft.     Tenderness: There is no abdominal tenderness. There is no guarding or rebound.  Musculoskeletal:     Cervical back: Neck supple.  Skin:    General: Skin is warm and dry.     Findings: Rash present.     Comments: Few scattered discrete raised red papules on right facial cheek.  No open sores or drainage.    Neurological:     General: No focal deficit present.     Mental Status: She is alert and oriented to person, place, and time.     Gait: Gait normal.  Psychiatric:        Mood and Affect: Mood normal.        Behavior: Behavior normal.      UC Treatments / Results  Labs (all labs ordered are listed, but only abnormal results are displayed) Labs Reviewed - No data to display  EKG   Radiology No results  found.  Procedures Procedures (including critical care time)  Medications Ordered in  UC Medications - No data to display  Initial Impression / Assessment and Plan / UC Course  I have reviewed the triage vital signs and the nursing notes.  Pertinent labs & imaging results that were available during my care of the patient were reviewed by me and considered in my medical decision making (see chart for details).   Allergic contact dermatitis.  Treating with tramcinolone cream 0.1% BID.  Instructed patient to follow-up with her PCP or her dermatologist if the rash is not improving.  Patient agrees to plan of care.     Final Clinical Impressions(s) / UC Diagnoses   Final diagnoses:  Allergic contact dermatitis, unspecified trigger     Discharge Instructions     Use the prescribed cream as directed.    Follow up with Dr. Derrel Nip or your dermatologist if the rash is not improving.        ED Prescriptions    Medication Sig Dispense Auth. Provider   triamcinolone cream (KENALOG) 0.1 % Apply 1 application topically 2 (two) times daily. 30 g Sharion Balloon, NP     PDMP not reviewed this encounter.   Sharion Balloon, NP 10/29/19 1235

## 2019-10-29 NOTE — Telephone Encounter (Signed)
Pt has a rash on her face and is having allergy Symptoms. Pt is going to call UC to get an appt Dermatology couldn't see her til August

## 2019-11-05 ENCOUNTER — Other Ambulatory Visit: Payer: Self-pay

## 2019-11-27 DIAGNOSIS — R748 Abnormal levels of other serum enzymes: Secondary | ICD-10-CM | POA: Diagnosis not present

## 2019-11-27 DIAGNOSIS — E0500 Thyrotoxicosis with diffuse goiter without thyrotoxic crisis or storm: Secondary | ICD-10-CM | POA: Diagnosis not present

## 2019-11-28 ENCOUNTER — Other Ambulatory Visit: Payer: Self-pay | Admitting: Internal Medicine

## 2019-11-30 ENCOUNTER — Telehealth: Payer: Self-pay

## 2019-11-30 NOTE — Telephone Encounter (Signed)
-----   Message from Philemon Kingdom, MD sent at 11/27/2019  4:39 PM EDT ----- Philemon Kingdom, can you please call pt:  I received records from [name] (GI) that he stopped her methimazole (she was taking a very low dose, 2.5 mg daily) for elevated liver enzymes.  I agree with this change, but I would like the patient to return for repeat thyroid labs approximately 1 month after stopping the methimazole.  Labs are in. Ty, C

## 2019-12-01 NOTE — Telephone Encounter (Signed)
Notified patient of message from Dr. Cruzita Lederer, patient expressed understanding and agreement. No further questions.  Lab appointment made.

## 2019-12-21 ENCOUNTER — Telehealth: Payer: Self-pay | Admitting: Internal Medicine

## 2019-12-21 NOTE — Telephone Encounter (Signed)
Pt called to see if labs were placed from [name] to been drawn here

## 2019-12-22 ENCOUNTER — Telehealth: Payer: Self-pay

## 2019-12-22 DIAGNOSIS — K760 Fatty (change of) liver, not elsewhere classified: Secondary | ICD-10-CM

## 2019-12-22 NOTE — Telephone Encounter (Signed)
LMTCB. Lab order has been placed. Need to scheduled pt a non fasting lab appt.

## 2019-12-22 NOTE — Telephone Encounter (Signed)
Lab order has been placed.  

## 2019-12-23 ENCOUNTER — Other Ambulatory Visit (INDEPENDENT_AMBULATORY_CARE_PROVIDER_SITE_OTHER): Payer: Medicare Other

## 2019-12-23 ENCOUNTER — Other Ambulatory Visit: Payer: Self-pay

## 2019-12-23 DIAGNOSIS — K760 Fatty (change of) liver, not elsewhere classified: Secondary | ICD-10-CM | POA: Diagnosis not present

## 2019-12-23 LAB — HEPATIC FUNCTION PANEL
AG Ratio: 1.1 (calc) (ref 1.0–2.5)
ALT: 90 U/L — ABNORMAL HIGH (ref 6–29)
AST: 88 U/L — ABNORMAL HIGH (ref 10–35)
Albumin: 4 g/dL (ref 3.6–5.1)
Alkaline phosphatase (APISO): 430 U/L — ABNORMAL HIGH (ref 37–153)
Bilirubin, Direct: 0.3 mg/dL — ABNORMAL HIGH (ref 0.0–0.2)
Globulin: 3.7 g/dL (calc) (ref 1.9–3.7)
Indirect Bilirubin: 0.6 mg/dL (calc) (ref 0.2–1.2)
Total Bilirubin: 0.9 mg/dL (ref 0.2–1.2)
Total Protein: 7.7 g/dL (ref 6.1–8.1)

## 2019-12-23 NOTE — Telephone Encounter (Signed)
Labs were drawn this morning.

## 2019-12-23 NOTE — Addendum Note (Signed)
Addended by: Elpidio Galea T on: 12/23/2019 10:22 AM   Modules accepted: Orders

## 2019-12-23 NOTE — Telephone Encounter (Signed)
LMTCB

## 2019-12-25 ENCOUNTER — Ambulatory Visit
Admission: EM | Admit: 2019-12-25 | Discharge: 2019-12-25 | Disposition: A | Discharge status: Home / Self Care | Payer: Medicare Other | Attending: Emergency Medicine | Admitting: Emergency Medicine

## 2019-12-25 ENCOUNTER — Other Ambulatory Visit: Payer: Self-pay

## 2019-12-25 DIAGNOSIS — J209 Acute bronchitis, unspecified: Secondary | ICD-10-CM | POA: Diagnosis not present

## 2019-12-25 MED ORDER — AZITHROMYCIN 250 MG PO TABS: 250.0000 mg | ORAL_TABLET | Freq: Every day | ORAL | 0 refills | Status: DC

## 2019-12-25 NOTE — Discharge Instructions (Signed)
Take the Zithromax as directed.  Follow up with your primary care provider if your symptoms are not improving.    

## 2019-12-25 NOTE — Progress Notes (Signed)
  Your liver enzyme remains mildly elevated .  Please make sure you avoiding tylenol and following up with your liver doctor as planned  Regards,    [name], MD

## 2019-12-25 NOTE — ED Provider Notes (Signed)
[facility]    CSN: [id number] Arrival date & time: 12/25/19  1044      History   Chief Complaint Chief Complaint  Patient presents with   Cough   Nasal Congestion    HPI Lauren Morris is a 86 y.o. female.   Patient presents with a 1 week history of postnasal drip, cough productive of yellow-green phlegm, chest congestion.  She states she has been "rattling" in her chest since yesterday.  She denies fever, chills, sore throat, shortness of breath, vomiting, diarrhea, rash, or other symptoms.  No treatment attempted at home.  The history is provided by the patient.    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      Home Medications    Prior to Admission medications   Medication Sig Start Date End Date Taking? Authorizing Provider  amLODipine (NORVASC) 10 MG tablet TAKE ONE TABLET (10 MG) BY MOUTH EVERY DAY 09/30/19   [name], MD  Ascorbic Acid (VITAMIN C) 1000 MG tablet Take 1,000 mg by mouth daily.    [provider]  atenolol (TENORMIN) 25 MG tablet TAKE  ONE TABLET BY MOUTH TWICE DAILY 11/30/19   [name], MD  azithromycin (ZITHROMAX) 250 MG tablet Take 1 tablet (250 mg total) by mouth daily. Take first 2 tablets together, then 1 every day until finished. 12/25/19   Sharion Balloon, NP  Calcium Carb-Cholecalciferol (CALCIUM 1000 + D PO) Take 1 tablet by mouth daily.      [provider]  dorzolamide-timolol (COSOPT) 22.3-6.8 MG/ML ophthalmic solution dorzolamide 22.3 mg-timolol 6.8 mg/mL eye drops    [provider]  fish oil-omega-3 fatty acids 1000 MG capsule Take 2 g by mouth daily.      [provider]  furosemide (LASIX) 20 MG tablet TAKE ONE TABLET BY MOUTH DAILY AS NEEDEDFOR FLUID RETENTION 10/03/18   [name], MD  methimazole (TAPAZOLE) 5 MG tablet Take 0.5 tablets (2.5 mg total) by mouth daily. With a meal. 10/22/19   Philemon Kingdom, MD  triamcinolone cream (KENALOG) 0.1 % Apply 1 application topically 2 (two) times daily. 10/29/19   Sharion Balloon, NP    Family History Family History  Problem Relation Age of Onset   Cancer Father    Hypertension Father    Heart attack Mother    Hypertension Mother     Social History Social History   Tobacco Use   Smoking status: Never Smoker   Smokeless tobacco: Never Used  Vaping Use   Vaping Use: Never used  Substance Use Topics   Alcohol use: No   Drug use: No     Allergies   Patient has no known allergies.   Review of Systems Review of Systems  Constitutional: Negative for chills and fever.  HENT: Positive for congestion and postnasal drip. Negative for ear pain and sore throat.   Eyes: Negative for pain and visual disturbance.  Respiratory: Positive for cough. Negative for shortness of breath.   Cardiovascular: Negative for chest pain and palpitations.  Gastrointestinal: Negative for abdominal pain, diarrhea and vomiting.  Genitourinary: Negative for dysuria and hematuria.  Musculoskeletal: Negative for arthralgias and back pain.    Skin: Negative for color change and rash.  Neurological: Negative for seizures and syncope.  All other systems reviewed and are negative.    Physical Exam Triage Vital Signs ED Triage Vitals  Enc Vitals Group     BP      Pulse      Resp      Temp      Temp src      SpO2      Weight      Height      Head Circumference      Peak Flow      Pain Score      Pain Loc      Pain Edu?      Excl. in [place]?    No data found.  Updated Vital Signs BP 116/70    Pulse (!) 58    Temp 98.4 F (36.9 C)    Resp 14    SpO2 95%   Visual Acuity Right Eye Distance:   Left Eye Distance:   Bilateral Distance:    Right Eye Near:   Left Eye Near:    Bilateral Near:     Physical Exam Vitals and nursing note reviewed.  Constitutional:      General: She is not in acute distress.    Appearance: She is well-developed. She is not ill-appearing.  HENT:     Head: Normocephalic and atraumatic.     Right Ear: Tympanic membrane normal.     Left Ear: Tympanic membrane normal.     Nose: Nose normal.     Mouth/Throat:     Mouth: Mucous membranes are moist.     Pharynx: Oropharynx is clear.  Eyes:     Conjunctiva/sclera: Conjunctivae normal.  Cardiovascular:     Rate and Rhythm: Normal rate and regular rhythm.     Heart sounds: No murmur heard.   Pulmonary:     Effort: Pulmonary effort is normal. No respiratory distress.     Breath sounds: Normal breath sounds.     Comments: Coughing during exam. Abdominal:     Palpations: Abdomen is soft.     Tenderness: There is no abdominal tenderness. There is no guarding or rebound.  Musculoskeletal:     Cervical back: Neck supple.     Right lower leg: No edema.     Left lower leg: No edema.  Skin:    General: Skin is warm and dry.     Findings: No rash.  Neurological:     General: No focal deficit present.     Mental Status: She is alert and oriented to person, place, and time.     Gait: Gait normal.  Psychiatric:        Mood and Affect: Mood  normal.        Behavior:  Behavior normal.      UC Treatments / Results  Labs (all labs ordered are listed, but only abnormal results are displayed) Labs Reviewed  NOVEL CORONAVIRUS, NAA    EKG   Radiology No results found.  Procedures Procedures (including critical care time)  Medications Ordered in UC Medications - No data to display  Initial Impression / Assessment and Plan / UC Course  I have reviewed the triage vital signs and the nursing notes.  Pertinent labs & imaging results that were available during my care of the patient were reviewed by me and considered in my medical decision making (see chart for details).   Acute bronchitis.  Treating with Zithromax.  Instructed patient to follow up with her PCP in a week for a recheck; she states she has an appointment already scheduled in about 1.5 weeks.  Instructed her to follow up sooner or return here if she is not improving.  Patient agrees to plan of care.      Final Clinical Impressions(s) / UC Diagnoses   Final diagnoses:  Acute bronchitis, unspecified organism     Discharge Instructions     Take the Zithromax as directed.    Follow up with your primary care provider if your symptoms are not improving.       ED Prescriptions    Medication Sig Dispense Auth. Provider   azithromycin (ZITHROMAX) 250 MG tablet Take 1 tablet (250 mg total) by mouth daily. Take first 2 tablets together, then 1 every day until finished. 6 tablet Sharion Balloon, NP     PDMP not reviewed this encounter.   Sharion Balloon, NP 12/25/19 1118

## 2019-12-25 NOTE — ED Triage Notes (Signed)
Patient reports earlier in the week, she began having nasal drainage. Over the last two days, reports she has increased productive cough (yellow, green) and chest congestion. Patient denies concerns for COVID but is agreeable to PCR testing.

## 2019-12-26 LAB — SARS-COV-2, NAA 2 DAY TAT

## 2019-12-26 LAB — NOVEL CORONAVIRUS, NAA: SARS-CoV-2, NAA: NOT DETECTED

## 2019-12-29 ENCOUNTER — Other Ambulatory Visit: Payer: Self-pay | Admitting: Internal Medicine

## 2019-12-31 ENCOUNTER — Other Ambulatory Visit: Payer: Self-pay

## 2019-12-31 ENCOUNTER — Other Ambulatory Visit (INDEPENDENT_AMBULATORY_CARE_PROVIDER_SITE_OTHER): Payer: Medicare Other

## 2019-12-31 DIAGNOSIS — E0500 Thyrotoxicosis with diffuse goiter without thyrotoxic crisis or storm: Secondary | ICD-10-CM

## 2019-12-31 LAB — T4, FREE: Free T4: 0.86 ng/dL (ref 0.60–1.60)

## 2019-12-31 LAB — T3, FREE: T3, Free: 3.2 pg/mL (ref 2.3–4.2)

## 2019-12-31 LAB — TSH: TSH: 2.14 u[IU]/mL (ref 0.35–4.50)

## 2020-01-04 ENCOUNTER — Telehealth: Payer: Self-pay | Admitting: Internal Medicine

## 2020-01-04 ENCOUNTER — Telehealth: Payer: Self-pay

## 2020-01-04 NOTE — Telephone Encounter (Signed)
Spoke with Lauren Morris and pt has been changed to a telephone visit.

## 2020-01-04 NOTE — Telephone Encounter (Signed)
Patient was tested for covid on 12/25/19, because of sinus issues. Test was negative. Patient is fully vaccinated. Can she come into office on 01/06/20 for her appointment?

## 2020-01-04 NOTE — Telephone Encounter (Signed)
LM for patient to call back to get lab results.

## 2020-01-04 NOTE — Telephone Encounter (Signed)
-----   Message from Philemon Kingdom, MD sent at 12/31/2019  2:11 PM EDT ----- Lenna Sciara, can you please call pt: her TFTs are normal

## 2020-01-05 NOTE — Telephone Encounter (Signed)
Notified patient of message from Dr. Gherghe, patient expressed understanding and agreement. No further questions.  

## 2020-01-06 ENCOUNTER — Ambulatory Visit: Payer: Medicare Other | Admitting: Internal Medicine

## 2020-01-14 ENCOUNTER — Other Ambulatory Visit: Payer: Self-pay

## 2020-01-14 ENCOUNTER — Ambulatory Visit (INDEPENDENT_AMBULATORY_CARE_PROVIDER_SITE_OTHER): Payer: Medicare Other | Admitting: Internal Medicine

## 2020-01-14 VITALS — BP 102/60 | HR 65 | Temp 98.0°F | Ht 61.0 in | Wt 161.2 lb

## 2020-01-14 DIAGNOSIS — K760 Fatty (change of) liver, not elsewhere classified: Secondary | ICD-10-CM

## 2020-01-14 DIAGNOSIS — E119 Type 2 diabetes mellitus without complications: Secondary | ICD-10-CM

## 2020-01-14 DIAGNOSIS — Z1231 Encounter for screening mammogram for malignant neoplasm of breast: Secondary | ICD-10-CM

## 2020-01-14 DIAGNOSIS — J410 Simple chronic bronchitis: Secondary | ICD-10-CM

## 2020-01-14 DIAGNOSIS — I10 Essential (primary) hypertension: Secondary | ICD-10-CM

## 2020-01-14 DIAGNOSIS — R9389 Abnormal findings on diagnostic imaging of other specified body structures: Secondary | ICD-10-CM

## 2020-01-14 DIAGNOSIS — E0500 Thyrotoxicosis with diffuse goiter without thyrotoxic crisis or storm: Secondary | ICD-10-CM | POA: Diagnosis not present

## 2020-01-14 LAB — COMPREHENSIVE METABOLIC PANEL
ALT: 60 U/L — ABNORMAL HIGH (ref 0–35)
AST: 59 U/L — ABNORMAL HIGH (ref 0–37)
Albumin: 3.9 g/dL (ref 3.5–5.2)
Alkaline Phosphatase: 373 U/L — ABNORMAL HIGH (ref 39–117)
BUN: 13 mg/dL (ref 6–23)
CO2: 30 mEq/L (ref 19–32)
Calcium: 9.6 mg/dL (ref 8.4–10.5)
Chloride: 103 mEq/L (ref 96–112)
Creatinine, Ser: 0.7 mg/dL (ref 0.40–1.20)
GFR: 79.42 mL/min (ref >60.00)
Glucose, Bld: 127 mg/dL — ABNORMAL HIGH (ref 70–99)
Potassium: 5 mEq/L (ref 3.5–5.1)
Sodium: 138 mEq/L (ref 135–145)
Total Bilirubin: 0.6 mg/dL (ref 0.2–1.2)
Total Protein: 7.6 g/dL (ref 6.0–8.3)

## 2020-01-14 LAB — LIPID PANEL
Cholesterol: 156 mg/dL (ref 0–200)
HDL: 51.3 mg/dL (ref >39.00)
LDL Cholesterol: 85 mg/dL (ref 0–99)
NonHDL: 105.08
Total CHOL/HDL Ratio: 3
Triglycerides: 101 mg/dL (ref 0.0–149.0)
VLDL: 20.2 mg/dL (ref 0.0–40.0)

## 2020-01-14 LAB — MICROALBUMIN / CREATININE URINE RATIO
Creatinine,U: 89.2 mg/dL
Microalb Creat Ratio: 1.2 mg/g (ref 0.0–30.0)
Microalb, Ur: 1.1 mg/dL (ref 0.0–1.9)

## 2020-01-14 LAB — HEMOGLOBIN A1C: Hgb A1c MFr Bld: 5.8 % (ref 4.6–6.5)

## 2020-01-14 MED ORDER — SPIRIVA HANDIHALER 18 MCG IN CAPS: 18.0000 ug | ORAL_CAPSULE | Freq: Every day | RESPIRATORY_TRACT | 12 refills | Status: DC

## 2020-01-14 NOTE — Progress Notes (Signed)
Pre visit review using our clinic review tool, if applicable. No additional management support is needed unless otherwise documented below in the visit note. 

## 2020-01-14 NOTE — Patient Instructions (Addendum)
I am recommending a medication you inhale that is called Spiriva  It will help dry up that moist cough you have   Please call and set up appt with dr Patsey Berthold  Your pulmonologist   I will send today's lab s to dr Earlean Shawl

## 2020-01-14 NOTE — Progress Notes (Signed)
Subjective:  Patient ID: Lauren Morris, female    DOB: 1932/02/28  Age: 86 y.o. MRN: [id number]  CC: The primary encounter diagnosis was Fatty liver disease, nonalcoholic. Diagnoses of Diabetes mellitus without complication ([place]), Breast cancer screening by mammogram, Abnormal chest CT, Simple chronic bronchitis ([place]), Essential hypertension, and Thyrotoxicosis due to Graves' disease were also pertinent to this visit.  HPI OVETA IDRIS presents for follow up on hypertension, type 2 DM, hyperlipidemia and cirrhosis secondary to NASH. She is accompanied by [name].   This visit occurred during the SARS-CoV-2 public health emergency.  Safety protocols were in place, including screening questions prior to the visit, additional usage of staff PPE, and extensive cleaning of exam room while observing appropriate contact time as indicated for disinfecting solutions.    Patient has received both doses of the Trinity 19 vaccine without complications.  Patient continues to mask when outside of the home except when walking in yard or at safe distances from others .  Patient denies any change in mood or development of unhealthy behaviors resuting from the pandemic's restriction of activities and socialization.    She was treated for acute bronchitis on July 2 with azithromycin. COVID 19 test was negative.  She had developed pururlent sputum without fevers or dyspnea 3 days prior to presentation.  She has recovered fully but continues to have a cough productive of clear sputum that has been present for a year.  She has OSA and HAS BEEN USING CPAP for over one year.  Using So Clean to clean .    Has chronic sputum in the middle of the night and in the morning.  NASH: Elevated liver enzymes were attributed to  methimazole per Dr Earlean Shawl and the medication was discontinued in  May .  (prescribed by [facility])  Repeat thyroid level was fine.     Type 2 DM:  She  feels generally well,  But is not   exercising regularly or trying to lose weight. Checking  blood sugars less than once daily at variable times, usually only if she feels she may be having a hypoglycemic event. .  BS have been under 130 fasting and < 150 post prandially.  Denies any recent hypoglyemic events.  Taking   medications as directed. Following a carbohydrate modified diet 6 days per week. Denies numbness, burning and tingling of extremities. Appetite is good.   Hypertension: patient checks blood pressure twice weekly at home.  Readings have been for the most part < 140/80 at rest . Patient is following a reduced salt diet most days and is taking medications as prescribed  Outpatient Medications Prior to Visit  Medication Sig Dispense Refill  . amLODipine (NORVASC) 10 MG tablet TAKE ONE TABLET (10 MG) BY MOUTH EVERY DAY 90 tablet 1  . Ascorbic Acid (VITAMIN C) 1000 MG tablet Take 1,000 mg by mouth daily.    Marland Kitchen atenolol (TENORMIN) 25 MG tablet TAKE ONE TABLET BY MOUTH TWICE DAILY 60 tablet 4  . azithromycin (ZITHROMAX) 250 MG tablet Take 1 tablet (250 mg total) by mouth daily. Take first 2 tablets together, then 1 every day until finished. 6 tablet 0  . Calcium Carb-Cholecalciferol (CALCIUM 1000 + D PO) Take 1 tablet by mouth daily.      . dorzolamide-timolol (COSOPT) 22.3-6.8 MG/ML ophthalmic solution dorzolamide 22.3 mg-timolol 6.8 mg/mL eye drops    . fish oil-omega-3 fatty acids 1000 MG capsule Take 2 g by mouth daily.      Marland Kitchen  furosemide (LASIX) 20 MG tablet TAKE ONE TABLET BY MOUTH DAILY AS NEEDEDFOR FLUID RETENTION 30 tablet 0  . triamcinolone cream (KENALOG) 0.1 % Apply 1 application topically 2 (two) times daily. 30 g 0  . methimazole (TAPAZOLE) 5 MG tablet Take 0.5 tablets (2.5 mg total) by mouth daily. With a meal. 60 tablet 3   No facility-administered medications prior to visit.    Review of Systems;  Patient denies headache, fevers, malaise, unintentional weight loss, skin rash, eye pain, sinus congestion and  sinus pain, sore throat, dysphagia,  Hemoptysis,  dyspnea, wheezing, chest pain, palpitations, orthopnea, edema, abdominal pain, nausea, melena, diarrhea, constipation, flank pain, dysuria, hematuria, urinary  Frequency, nocturia, numbness, tingling, seizures,  Focal weakness, Loss of consciousness,  Tremor, insomnia, depression, anxiety, and suicidal ideation.      Objective:  BP 102/60   Pulse 65   Temp 98 F (36.7 C) (Oral)   Ht '5\' 1"'$  (1.549 m)   Wt 161 lb 3.2 oz (73.1 kg)   SpO2 96%   BMI 30.46 kg/m   BP Readings from Last 3 Encounters:  01/14/20 102/60  12/25/19 116/70  10/29/19 (!) 138/57    Wt Readings from Last 3 Encounters:  01/14/20 161 lb 3.2 oz (73.1 kg)  10/29/19 163 lb (73.9 kg)  10/22/19 165 lb (74.8 kg)    General appearance: alert, cooperative and appears stated age Ears: normal TM's and external ear canals both ears Throat: lips, mucosa, and tongue normal; teeth and gums normal Neck: no adenopathy, no carotid bruit, supple, symmetrical, trachea midline and thyroid not enlarged, symmetric, no tenderness/mass/nodules Back: symmetric, no curvature. ROM normal. No CVA tenderness. Lungs: clear to auscultation bilaterally Heart: regular rate and rhythm, S1, S2 normal, no murmur, click, rub or gallop Abdomen: soft, non-tender; bowel sounds normal; no masses,  no organomegaly Pulses: 2+ and symmetric Skin: Skin color, texture, turgor normal. No rashes or lesions Lymph nodes: Cervical, supraclavicular, and axillary nodes normal.  Lab Results  Component Value Date   HGBA1C 5.8 01/14/2020   HGBA1C 6.0 02/24/2019   HGBA1C 5.9 (A) 04/17/2018    Lab Results  Component Value Date   CREATININE 0.70 01/14/2020   CREATININE 0.68 10/22/2019   CREATININE 0.5 04/14/2019    Lab Results  Component Value Date   WBC 11.3 (H) 06/13/2018   HGB 16.5 (H) 06/13/2018   HCT 48.4 (H) 06/13/2018   PLT 222 06/13/2018   GLUCOSE 127 (H) 01/14/2020   CHOL 156 01/14/2020    TRIG 101.0 01/14/2020   HDL 51.30 01/14/2020   LDLDIRECT 80.0 04/08/2017   LDLCALC 85 01/14/2020   ALT 60 (H) 01/14/2020   AST 59 (H) 01/14/2020   NA 138 01/14/2020   K 5.0 01/14/2020   CL 103 01/14/2020   CREATININE 0.70 01/14/2020   BUN 13 01/14/2020   CO2 30 01/14/2020   TSH 2.14 12/31/2019   HGBA1C 5.8 01/14/2020   MICROALBUR 1.1 01/14/2020    No results found.  Assessment & Plan:   Problem List Items Addressed This Visit      Unprioritized   Abnormal chest CT   Chronic bronchitis ([place])    Suggested by history of productive cough for the past year.  Pulmonary workup reviewed. Trial of Spiriva       Diabetes mellitus without complication ([place])    Improving control with low GI diet and exercise .  hemoglobin A1c has been repeatedly  <6.0. Patient is  Up to date on eye exam and foot  exam   There is  no proteinuria on prior micro urinalysis . She is opposed to statin use .   Lab Results  Component Value Date   HGBA1C 5.8 01/14/2020   Lab Results  Component Value Date   MICROALBUR 1.1 01/14/2020   Lab Results  Component Value Date   CREATININE 0.70 01/14/2020         Relevant Orders   Hemoglobin A1c (Completed)   Lipid panel (Completed)   Comprehensive metabolic panel (Completed)   Microalbumin / creatinine urine ratio (Completed)   Essential hypertension    Well controlled on current regimen. Renal function stable, no changes today.  Lab Results  Component Value Date   CREATININE 0.70 01/14/2020   Lab Results  Component Value Date   NA 138 01/14/2020   K 5.0 01/14/2020   CL 103 01/14/2020   CO2 30 01/14/2020         Fatty liver disease, nonalcoholic - Primary    With persistent transaminitis and alk phos elevation resulting in  referral to GI. Current enzyme elevation attributed by Dr Earlean Shawl  To methimazole,  Which was stopped in April.  Liver enzymes are trending down by comparison with intermi labs done at [facility] last month.  She has no symptoms of  cirrhosis and no signs of cirrhosis or cholestasis on ultrasound  Lab Results  Component Value Date   ALT 60 (H) 01/14/2020   AST 59 (H) 01/14/2020   ALKPHOS 373 (H) 01/14/2020   BILITOT 0.6 01/14/2020         Relevant Orders   Hepatic function panel   Thyrotoxicosis due to Graves' disease    Recently diagnosed by Dr Renne Crigler,  She has stopped taking methimazole due to elevated liver enzymes and TSH is currently normal.        Other Visit Diagnoses    Breast cancer screening by mammogram       Relevant Orders   MM DIGITAL SCREENING BILATERAL      I have discontinued Kenny M. Herbst's methimazole. I am also having her start on Spiriva HandiHaler. Additionally, I am having her maintain her Calcium Carb-Cholecalciferol (CALCIUM 1000 + D PO), fish oil-omega-3 fatty acids, dorzolamide-timolol, furosemide, vitamin C, triamcinolone cream, atenolol, azithromycin, and amLODipine.  Meds ordered this encounter  Medications  . tiotropium (SPIRIVA HANDIHALER) 18 MCG inhalation capsule    Sig: Place 1 capsule (18 mcg total) into inhaler and inhale daily.    Dispense:  30 capsule    Refill:  12   I provided  30 minutes of  face-to-face time during this encounter reviewing patient's current problems and past surgeries, labs and imaging studies, providing counseling on the above mentioned problems , and coordination  of care .  Medications Discontinued During This Encounter  Medication Reason  . methimazole (TAPAZOLE) 5 MG tablet     Follow-up: No follow-ups on file.   [name], MD

## 2020-01-16 ENCOUNTER — Encounter: Payer: Self-pay | Admitting: Internal Medicine

## 2020-01-16 DIAGNOSIS — J449 Chronic obstructive pulmonary disease, unspecified: Secondary | ICD-10-CM | POA: Insufficient documentation

## 2020-01-16 DIAGNOSIS — J42 Unspecified chronic bronchitis: Secondary | ICD-10-CM | POA: Insufficient documentation

## 2020-01-16 NOTE — Assessment & Plan Note (Signed)
Improving control with low GI diet and exercise .  hemoglobin A1c has been repeatedly  <6.0. Patient is  Up to date on eye exam and foot exam   There is  no proteinuria on prior micro urinalysis . She is opposed to statin use .   Lab Results  Component Value Date   HGBA1C 5.8 01/14/2020   Lab Results  Component Value Date   MICROALBUR 1.1 01/14/2020   Lab Results  Component Value Date   CREATININE 0.70 01/14/2020

## 2020-01-16 NOTE — Assessment & Plan Note (Addendum)
Suggested by history of productive cough for the past year.  Pulmonary workup reviewed. Trial of Spiriva

## 2020-01-16 NOTE — Assessment & Plan Note (Signed)
Well controlled on current regimen. Renal function stable, no changes today.  Lab Results  Component Value Date   CREATININE 0.70 01/14/2020   Lab Results  Component Value Date   NA 138 01/14/2020   K 5.0 01/14/2020   CL 103 01/14/2020   CO2 30 01/14/2020

## 2020-01-16 NOTE — Assessment & Plan Note (Signed)
Recently diagnosed by Dr Renne Crigler,  She has stopped taking methimazole due to elevated liver enzymes and TSH is currently normal.

## 2020-01-16 NOTE — Assessment & Plan Note (Signed)
With persistent transaminitis and alk phos elevation resulting in  referral to GI. Current enzyme elevation attributed by Dr Earlean Shawl  To methimazole,  Which was stopped in April.  Liver enzymes are trending down by comparison with intermi labs done at [facility] last month.  She has no symptoms of cirrhosis and no signs of cirrhosis or cholestasis on ultrasound  Lab Results  Component Value Date   ALT 60 (H) 01/14/2020   AST 59 (H) 01/14/2020   ALKPHOS 373 (H) 01/14/2020   BILITOT 0.6 01/14/2020

## 2020-01-17 LAB — HM DIABETES EYE EXAM

## 2020-01-18 ENCOUNTER — Encounter: Payer: Self-pay | Admitting: Pulmonary Disease

## 2020-01-18 ENCOUNTER — Ambulatory Visit: Payer: Medicare Other | Admitting: Pulmonary Disease

## 2020-01-18 ENCOUNTER — Other Ambulatory Visit: Payer: Self-pay

## 2020-01-18 VITALS — BP 124/60 | HR 61 | Temp 97.9°F | Ht 61.0 in | Wt 163.2 lb

## 2020-01-18 DIAGNOSIS — E669 Obesity, unspecified: Secondary | ICD-10-CM

## 2020-01-18 DIAGNOSIS — R0600 Dyspnea, unspecified: Secondary | ICD-10-CM

## 2020-01-18 DIAGNOSIS — I5189 Other ill-defined heart diseases: Secondary | ICD-10-CM

## 2020-01-18 DIAGNOSIS — G4733 Obstructive sleep apnea (adult) (pediatric): Secondary | ICD-10-CM

## 2020-01-18 NOTE — Progress Notes (Signed)
    Assessment & Plan:  1. Obstructive sleep apnea hypopnea, severe (Primary)  2. Dyspnea, unspecified type  3. Obesity (BMI 30.0-34.9)  4. Diastolic dysfunction   Patient Instructions  We have provided a handicap sticker form for you.   Continue using your CPAP as you are doing.   We will set you up with follow-up by our sleep physician in 6 months.  Please note: late entry documentation due to logistical difficulties during COVID-19 pandemic. This note is filed for information purposes only, and is not intended to be used for billing, nor does it represent the full scope/nature of the visit in question. Please see any associated scanned media linked to date of encounter for additional pertinent information.  Subjective:    HPI: Lauren Morris is a 86 y.o. female presenting to the pulmonology clinic on 01/18/2020 with report of: Follow-up (recently dx with bronchitis 2-3wk ago. c/o sob with exertion. no other sx currently. )     Outpatient Encounter Medications as of 01/18/2020  Medication Sig Note   Calcium  Carb-Cholecalciferol (CALCIUM  1000 + D PO) Take 1 tablet by mouth daily.    [DISCONTINUED] amLODipine  (NORVASC ) 10 MG tablet TAKE ONE TABLET (10 MG) BY MOUTH EVERY DAY    [DISCONTINUED] Ascorbic Acid  (VITAMIN C ) 1000 MG tablet Take 1,000 mg by mouth daily. (Patient not taking: Reported on 09/04/2021)    [DISCONTINUED] atenolol  (TENORMIN ) 25 MG tablet TAKE ONE TABLET BY MOUTH TWICE DAILY    [DISCONTINUED] azithromycin  (ZITHROMAX ) 250 MG tablet Take 1 tablet (250 mg total) by mouth daily. Take first 2 tablets together, then 1 every day until finished.    [DISCONTINUED] dorzolamide -timolol  (COSOPT ) 22.3-6.8 MG/ML ophthalmic solution Place 1 drop into both eyes 2 (two) times daily. (Patient not taking: Reported on 10/14/2023)    [DISCONTINUED] fish oil-omega-3 fatty acids  1000 MG capsule Take 2 g by mouth daily. (Patient not taking: Reported on 09/04/2021)    [DISCONTINUED]  furosemide  (LASIX ) 20 MG tablet TAKE ONE TABLET BY MOUTH DAILY AS NEEDEDFOR FLUID RETENTION (Patient taking differently: Take 20 mg by mouth daily. TAKE ONE TABLET BY MOUTH DAILY AS NEEDED FOR FLUID RETENTION) 08/02/2021: More than 6 moths since had last   [DISCONTINUED] tiotropium (SPIRIVA  HANDIHALER) 18 MCG inhalation capsule Place 1 capsule (18 mcg total) into inhaler and inhale daily.    [DISCONTINUED] triamcinolone  cream (KENALOG ) 0.1 % Apply 1 application topically 2 (two) times daily.    No facility-administered encounter medications on file as of 01/18/2020.      Objective:   Vitals:   01/18/20 1041  BP: (!) 124/60  Pulse: 61  Temp: 97.9 F (36.6 C)  Height: 5' 1 (1.549 m)  Weight: 163 lb 3.2 oz (74 kg)  SpO2: 95%  TempSrc: Temporal  BMI (Calculated): 30.85     Physical exam documentation is limited by delayed entry of information.

## 2020-01-18 NOTE — Patient Instructions (Signed)
We have provided a handicap sticker form for you.   Continue using your CPAP as you are doing.   We will set you up with follow-up by our sleep physician in 6 months.

## 2020-03-01 ENCOUNTER — Encounter: Payer: Self-pay | Admitting: Internal Medicine

## 2020-03-01 ENCOUNTER — Other Ambulatory Visit: Payer: Self-pay

## 2020-03-01 ENCOUNTER — Ambulatory Visit (INDEPENDENT_AMBULATORY_CARE_PROVIDER_SITE_OTHER): Payer: Medicare Other | Admitting: Internal Medicine

## 2020-03-01 VITALS — BP 110/70 | HR 48 | Wt 162.0 lb

## 2020-03-01 DIAGNOSIS — E0500 Thyrotoxicosis with diffuse goiter without thyrotoxic crisis or storm: Secondary | ICD-10-CM | POA: Diagnosis not present

## 2020-03-01 DIAGNOSIS — E039 Hypothyroidism, unspecified: Secondary | ICD-10-CM | POA: Diagnosis not present

## 2020-03-01 LAB — T3, FREE: T3, Free: 3.3 pg/mL (ref 2.3–4.2)

## 2020-03-01 LAB — T4, FREE: Free T4: 0.95 ng/dL (ref 0.60–1.60)

## 2020-03-01 LAB — TSH: TSH: 2.74 u[IU]/mL (ref 0.35–4.50)

## 2020-03-01 NOTE — Patient Instructions (Signed)
Please stop at the lab.  Please continue Atenolol 25 mg 2x a day.  Please come back for a follow-up appointment in 6 months.

## 2020-03-01 NOTE — Progress Notes (Signed)
Patient ID: Lauren Morris, female   DOB: [birth year], 86 y.o.   MRN: [id number]   This visit occurred during the SARS-CoV-2 public health emergency.  Safety protocols were in place, including screening questions prior to the visit, additional usage of staff PPE, and extensive cleaning of exam room while observing appropriate contact time as indicated for disinfecting solutions.   HPI  Lauren Morris is a 86 y.o.-year-old female, referred by her PCP, Dr. Derrel Nip, for management of Graves' disease, with previous history of hypothyroidism.  She is here with her daughter Lauren Morris) who offers part of the history related to her PMH, symptoms, medications.  Reviewed and addended history: Pt. has a history of hypothyroidism since at least 2012 >> she was on levothyroxine 25 mcg. However, in 02/2019, her TSH was found to be undetectably low and her levothyroxine dose was gradually reduced and finally stopped in 02/2019.  Despite this, her TSH remains low.  Reviewed her TFTs: Lab Results  Component Value Date   TSH 2.14 12/31/2019   TSH 6.10 (H) 10/22/2019   TSH <0.01 (L) 06/23/2019   TSH 0.01 (L) 05/28/2019   TSH 0.01 (L) 04/13/2019   TSH <0.01 (L) 03/10/2019   TSH <0.01 (L) 02/24/2019   TSH 2.49 04/17/2018   TSH 2.11 10/16/2017   TSH 3.43 04/08/2017   FREET4 0.86 12/31/2019   FREET4 0.70 10/22/2019   FREET4 1.39 06/23/2019   T3FREE 3.2 12/31/2019   T3FREE 2.9 10/22/2019   T3FREE 4.2 06/23/2019   Her TSI's were elevated: 03/10/2019: TPO antibodies 5 (<9) Lab Results  Component Value Date   THGAB <1 03/10/2019   Lab Results  Component Value Date   TSI 167 (H) 06/23/2019   Thyroid uptake and scan (09/17/2019): Homogeneous tracer distribution in both thyroid lobes. No focal areas of increased or decreased tracer localization.  4 hour I-123 uptake = 15.8% (normal 5-20%) 24 hour I-123 uptake = 34.6% (normal 10-30%)  IMPRESSION: Normal thyroid scan. Elevated 24 hour radio iodine uptake  consistent with hyperthyroidism. Overall findings are consistent with Graves disease.  We started methimazole 5 mg daily in 05/2019, and then decrease to 2.5 mg daily.  We also started atenolol 25 mg daily and PCP increase this to twice daily.  She continues on this dose.  She previously had weight loss (approximately 20 pounds in 10 months), fatigue, shortness of breath with exertion, hair loss.  At this visit, she does not have complaints.  Pt denies: - feeling nodules in neck - hoarseness - dysphagia - choking - SOB with lying down  No FH of thyroid disease. No FH of thyroid cancer. No h/o radiation tx to head or neck.  No seaweed or kelp. No recent contrast studies. No herbal supplements. No Biotin use. No recent steroids use.   She has a history of OSA and uses a CPAP.  ROS: Constitutional: no weight gain/no weight loss, + fatigue, no subjective hyperthermia, no subjective hypothermia, + nocturia, + poor sleep Eyes: no blurry vision, no xerophthalmia ENT: no sore throat, + see HPI Cardiovascular: no CP/no SOB/no palpitations/+ leg swelling Respiratory: no cough/no SOB/no wheezing Gastrointestinal: no N/no V/no D/no C/no acid reflux Musculoskeletal: no muscle aches/+ joint aches Skin: no rashes, + hair loss Neurological: no tremors/no numbness/no tingling/no dizziness  I reviewed pt's medications, allergies, PMH, social hx, family hx, and changes were documented in the history of present illness. Otherwise, unchanged from my initial visit note.  Past Medical History:  Diagnosis Date  .  Cyst    hx o on right breast  . DJD (degenerative joint disease)    right knee  . Glaucoma   . Hypertension   . Sleep apnea   . Thyroid disease    Past Surgical History:  Procedure Laterality Date  . ABDOMINAL HYSTERECTOMY    . VITRECTOMY  July 2012   right eye with membrane peel [name], [place])   Social History   Socioeconomic History  . Marital status: Widowed    Spouse  name: Not on file  . Number of children: 1  . Years of education: Not on file  . Highest education level: Not on file  Occupational History  .  Homemaker  Tobacco Use  . Smoking status: Never Smoker  . Smokeless tobacco: Never Used  Substance and Sexual Activity  . Alcohol use: No  . Drug use: No  . Sexual activity: Not on file  Other Topics Concern  . Not on file  Social History Narrative  . Not on file   Social Determinants of Health   Financial Resource Strain:   . Difficulty of Paying Living Expenses: Not on file  Food Insecurity:   . Worried About Charity fundraiser in the Last Year: Not on file  . Ran Out of Food in the Last Year: Not on file  Transportation Needs:   . Lack of Transportation (Medical): Not on file  . Lack of Transportation (Non-Medical): Not on file  Physical Activity:   . Days of Exercise per Week: Not on file  . Minutes of Exercise per Session: Not on file  Stress:   . Feeling of Stress : Not on file  Social Connections:   . Frequency of Communication with Friends and Family: Not on file  . Frequency of Social Gatherings with Friends and Family: Not on file  . Attends Religious Services: Not on file  . Active Member of Clubs or Organizations: Not on file  . Attends Archivist Meetings: Not on file  . Marital Status: Not on file  Intimate Partner Violence:   . Fear of Current or Ex-Partner: Not on file  . Emotionally Abused: Not on file  . Physically Abused: Not on file  . Sexually Abused: Not on file   On: [place] + Current Outpatient Medications on File Prior to Visit  Medication Sig Dispense Refill  . amLODipine (NORVASC) 10 MG tablet TAKE ONE TABLET (10 MG) BY MOUTH EVERY DAY 90 tablet 1  . Ascorbic Acid (VITAMIN C) 1000 MG tablet Take 1,000 mg by mouth daily.    Marland Kitchen atenolol (TENORMIN) 25 MG tablet TAKE ONE TABLET BY MOUTH TWICE DAILY 60 tablet 4  . Calcium Carb-Cholecalciferol (CALCIUM 1000 + D PO) Take 1 tablet by mouth  daily.      . dorzolamide-timolol (COSOPT) 22.3-6.8 MG/ML ophthalmic solution dorzolamide 22.3 mg-timolol 6.8 mg/mL eye drops    . fish oil-omega-3 fatty acids 1000 MG capsule Take 2 g by mouth daily.      . furosemide (LASIX) 20 MG tablet TAKE ONE TABLET BY MOUTH DAILY AS NEEDEDFOR FLUID RETENTION 30 tablet 0  . tiotropium (SPIRIVA HANDIHALER) 18 MCG inhalation capsule Place 1 capsule (18 mcg total) into inhaler and inhale daily. 30 capsule 12   No current facility-administered medications on file prior to visit.   No Known Allergies Family History  Problem Relation Age of Onset  . Cancer Father   . Hypertension Father   . Heart attack Mother   .  Hypertension Mother   Also, HTN mother and father, heart disease in mother.  PE: BP 110/70   Pulse (!) 48   Wt 162 lb (73.5 kg)   BMI 30.61 kg/m  Wt Readings from Last 3 Encounters:  03/01/20 162 lb (73.5 kg)  01/18/20 163 lb 3.2 oz (74 kg)  01/14/20 161 lb 3.2 oz (73.1 kg)   Constitutional: overweight, in NAD Eyes: PERRLA, EOMI, no exophthalmos ENT: moist mucous membranes, no thyromegaly, no cervical lymphadenopathy Cardiovascular: RRR, No MRG Respiratory: CTA B Gastrointestinal: abdomen soft, NT, ND, BS+ Musculoskeletal: no deformities, strength intact in all 4 Skin: moist, warm, no rashes Neurological: no tremor with outstretched hands, DTR normal in all 4  ASSESSMENT: 1.  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 History of hypothyroidism  PLAN:  1.  Thyrotoxicosis due to Graves' disease -Patient with history of hypothyroidism, previously on 25 mcg levothyroxine, however, with new development of thyrotoxicosis in 02/2019, which persisted despite stopping her levothyroxine.  She also has a significant history of weight loss of more than 20 pounds in 10 months.  She developed tachycardia and shortness of breath with exertion.  She did not have hot flashes or anxiety but she had slight tremors with outstretched hands. -Her  TFTs were in the thyrotoxic range and her TSI's were mildly elevated. A thyroid uptake and scan obtained on 09/17/2019 was consistent with Graves' disease -We started methimazole at 5 mg daily and we were able to decrease the dose to 2.5 mg daily afterwards -Since last visit, she had transaminitis and dR. Medoff recommended that she came off methimazole -She did so and subsequent TFTs were normal on 12/31/2019.  We will repeat them today. -we discussed about possible modalities of treatment for Graves' disease to include methimazole (however, due to the elevated LFTs, we may not be able to use this), RAI treatment, and last resort, surgery. -She continues on atenolol.  She was tachycardic when she was off beta-blockers.  Pulse today was low >> may need to decrease Atenolol if this continues at home. -No signs of Graves' ophthalmopathy: No double vision, blurry vision, eye pain, chemosis -I we will see her back in 6 months but possibly sooner for labs  2.  History of hypothyroidism -She was previously on a very low dose of levothyroxine, 25 mcg daily, now off since 02/2019 -After her Graves' disease has resolved, we have to watch her closely as she can again develop hypothyroidism  Component     Latest Ref Rng & Units 03/01/2020  TSH     0.35 - 4.50 uIU/mL 2.74  T4,Free(Direct)     0.60 - 1.60 ng/dL 0.95  Triiodothyronine,Free,Serum     2.3 - 4.2 pg/mL 3.3   Thyroid tests are normal.  No intervention needed for now.  Philemon Kingdom, MD PhD [facility] Endocrinology

## 2020-03-02 ENCOUNTER — Telehealth: Payer: Self-pay

## 2020-03-02 NOTE — Telephone Encounter (Signed)
Test result letter mailed with the below message.

## 2020-03-02 NOTE — Telephone Encounter (Signed)
-----   Message from Philemon Kingdom, MD sent at 03/01/2020  5:35 PM EDT ----- Lauren Morris, can you please call pt: Good news: The thyroid tests are still normal.  We will repeat them when she returns to see me in 6 months.

## 2020-03-31 DIAGNOSIS — Z23 Encounter for immunization: Secondary | ICD-10-CM | POA: Diagnosis not present

## 2020-04-21 DIAGNOSIS — H35341 Macular cyst, hole, or pseudohole, right eye: Secondary | ICD-10-CM | POA: Diagnosis not present

## 2020-04-21 DIAGNOSIS — H524 Presbyopia: Secondary | ICD-10-CM | POA: Diagnosis not present

## 2020-04-21 DIAGNOSIS — H35373 Puckering of macula, bilateral: Secondary | ICD-10-CM | POA: Diagnosis not present

## 2020-04-21 DIAGNOSIS — D3131 Benign neoplasm of right choroid: Secondary | ICD-10-CM | POA: Diagnosis not present

## 2020-04-21 DIAGNOSIS — H401131 Primary open-angle glaucoma, bilateral, mild stage: Secondary | ICD-10-CM | POA: Diagnosis not present

## 2020-04-27 ENCOUNTER — Other Ambulatory Visit: Payer: Self-pay | Admitting: Internal Medicine

## 2020-04-27 DIAGNOSIS — Z23 Encounter for immunization: Secondary | ICD-10-CM | POA: Diagnosis not present

## 2020-05-30 ENCOUNTER — Ambulatory Visit: Payer: Medicare Other

## 2020-05-30 ENCOUNTER — Telehealth: Payer: Self-pay

## 2020-05-30 NOTE — Telephone Encounter (Signed)
Unsuccessful attempt to reach patient for scheduled awv. No answer. Left message to call the office back and reschedule as convenient.

## 2020-06-04 DIAGNOSIS — Z20822 Contact with and (suspected) exposure to covid-19: Secondary | ICD-10-CM | POA: Diagnosis not present

## 2020-06-07 ENCOUNTER — Telehealth: Payer: Self-pay | Admitting: Internal Medicine

## 2020-06-07 NOTE — Telephone Encounter (Signed)
Patient called in stated that she has had a coughing congestion for two week had a covid test came back negative have a sinus infection wanted to if Dr.Tullo would call her something in for it or do she need to talk with her.

## 2020-06-07 NOTE — Telephone Encounter (Signed)
Called and scheduled the patient for 06/09/2020 at 8:30am for a virtual office visit as she does not have a cell phone or laptop for a video visit.

## 2020-06-07 NOTE — Telephone Encounter (Signed)
Patient 's daughter called back about her mother was concern about mother because she is on a CPAP and she need to breath and  for insurance purpose and she is not able to be on it because of the coughing [phone] want to know if she could speak with her before Thursday.

## 2020-06-07 NOTE — Telephone Encounter (Signed)
Called and spoke to Lauren Morris, Kasara' daughter. [name] states that Lauren Morris has been experiencing a productive cough with green phlegm, nasal drainage, and issues sleeping due to being on a CPAP and unable to use it with current symptoms. She states that symptoms started on 06/01/2020 and she was tested at [facility] for cov[facility] on 06/05/2020 and it was negative. [name] will take [name] to an urgent care today at either [facility] or the [facility] urgent care but wanted to keep the 06/09/2020 appointment in case they can not make it to an urgent care. [name] will call back to cancel the 06/09/2020 appointment if they are able to go to an urgent care.

## 2020-06-08 ENCOUNTER — Other Ambulatory Visit: Payer: Self-pay

## 2020-06-08 ENCOUNTER — Ambulatory Visit (INDEPENDENT_AMBULATORY_CARE_PROVIDER_SITE_OTHER)
Admission: EM | Admit: 2020-06-08 | Discharge: 2020-06-08 | Disposition: A | Discharge status: Home / Self Care | Payer: Medicare Other | Source: Home / Self Care

## 2020-06-08 ENCOUNTER — Ambulatory Visit
Admission: RE | Admit: 2020-06-08 | Discharge: 2020-06-08 | Disposition: A | Discharge status: Home / Self Care | Payer: Medicare Other | Source: Ambulatory Visit | Attending: Internal Medicine | Admitting: Internal Medicine

## 2020-06-08 DIAGNOSIS — J209 Acute bronchitis, unspecified: Secondary | ICD-10-CM | POA: Diagnosis not present

## 2020-06-08 DIAGNOSIS — R059 Cough, unspecified: Secondary | ICD-10-CM

## 2020-06-08 MED ORDER — BENZONATATE 100 MG PO CAPS: 100.0000 mg | ORAL_CAPSULE | Freq: Three times a day (TID) | ORAL | 0 refills | Status: DC

## 2020-06-08 MED ORDER — AZITHROMYCIN 250 MG PO TABS: 250.0000 mg | ORAL_TABLET | Freq: Every day | ORAL | 0 refills | Status: DC

## 2020-06-08 NOTE — ED Provider Notes (Signed)
Lauren Morris    CSN: [id number] Arrival date & time: 06/08/20  0944      History   Chief Complaint Chief Complaint  Patient presents with  . Appointment  . Sore Throat    HPI Lauren Morris is a 86 y.o. female.   Patient presents with cough productive of yellow and green sputum, nasal congestion, sore throat x2 weeks.  She had a negative Covid test on 06/04/2020.  Treatment attempted at home with cough drops.  She denies fever, chills, shortness of breath, vomiting, diarrhea, or other symptoms.  Her medical history includes chronic bronchitis, diabetes, hypertension, sleep apnea, hypothyroidism, generalized weakness.  The history is provided by the patient and medical records.    Past Medical History:  Diagnosis Date  . Cyst    hx o on right breast  . DJD (degenerative joint disease)    right knee  . Glaucoma   . Hypertension   . Sleep apnea   . Thyroid disease     Patient Active Problem List   Diagnosis Date Noted  . Chronic bronchitis ([place]) 01/16/2020  . Abnormal EKG 07/07/2019  . Generalized weakness 07/07/2019  . 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 Elevated hemoglobin ([place]) 06/15/2018  . Abnormal chest CT 05/25/2016  . Fatty liver disease, nonalcoholic 41/93/7902  . Obesity 11/13/2012  . Diabetes mellitus without complication ([place]) 40/97/3532  . Medicare annual wellness visit, subsequent 03/11/2012  . Hyperlipidemia LDL goal <100 04/15/2011  . Essential hypertension 02/22/2011  . Acquired hypothyroidism 02/22/2011    Past Surgical History:  Procedure Laterality Date  . ABDOMINAL HYSTERECTOMY    . VITRECTOMY  July 2012   right eye with membrane peel [name], [place])    OB History   No obstetric history on file.      Home Medications    Prior to Admission medications   Medication Sig Start Date End Date Taking? Authorizing Provider  amLODipine (NORVASC) 10 MG tablet TAKE ONE TABLET (10 MG) BY MOUTH EVERY DAY 12/29/19   [name], MD  Ascorbic Acid (VITAMIN C) 1000 MG tablet Take 1,000 mg by mouth daily.    [provider]  atenolol (TENORMIN) 25 MG tablet TAKE 1 TABLET BY MOUTH TWICE DAILY 04/27/20   [name], MD  azithromycin (ZITHROMAX) 250 MG tablet Take 1 tablet (250 mg total) by mouth daily. Take first 2 tablets together, then 1 every day until finished. 06/08/20   Sharion Balloon, NP  benzonatate (TESSALON) 100 MG capsule Take 1 capsule (100 mg total) by mouth every 8 (eight) hours. 06/08/20   Sharion Balloon, NP  Calcium Carb-Cholecalciferol (CALCIUM 1000 + D PO) Take 1 tablet by mouth daily.      [provider]  dorzolamide-timolol (COSOPT) 22.3-6.8 MG/ML ophthalmic solution dorzolamide 22.3 mg-timolol 6.8 mg/mL eye drops    [provider]  fish oil-omega-3 fatty acids 1000 MG capsule Take 2 g by mouth daily.      [provider]  furosemide (LASIX) 20 MG tablet TAKE ONE TABLET BY MOUTH DAILY AS NEEDEDFOR FLUID RETENTION 10/03/18   [name], MD  tiotropium (SPIRIVA HANDIHALER) 18 MCG inhalation capsule Place 1 capsule (18 mcg total) into inhaler and inhale daily. 01/14/20   [name], MD    Family History Family History  Problem Relation Age of Onset  . Cancer Father   . Hypertension Father   . Heart attack Mother   . Hypertension Mother  Social History Social History   Tobacco Use  . Smoking status: Never Smoker  . Smokeless tobacco: Never Used  Vaping Use  . Vaping Use: Never used  Substance Use Topics  . Alcohol use: No  . Drug use: No     Allergies   Patient has no known allergies.   Review of Systems Review of Systems  Constitutional: Negative for chills and fever.  HENT: Positive for congestion and sore throat. Negative for ear pain.   Eyes: Negative for pain and visual disturbance.  Respiratory: Positive for cough. Negative for shortness of breath.   Cardiovascular: Negative for chest pain and palpitations.   Gastrointestinal: Negative for abdominal pain, diarrhea and vomiting.  Genitourinary: Negative for dysuria and hematuria.  Musculoskeletal: Negative for arthralgias and back pain.  Skin: Negative for color change and rash.  Neurological: Negative for seizures and syncope.  All other systems reviewed and are negative.    Physical Exam Triage Vital Signs ED Triage Vitals  Enc Vitals Group     BP 06/08/20 0948 (!) 148/77     Pulse Rate 06/08/20 0948 (!) 59     Resp 06/08/20 0948 16     Temp 06/08/20 0948 98.9 F (37.2 C)     Temp Source 06/08/20 0948 Oral     SpO2 06/08/20 0948 95 %     Weight 06/08/20 0949 161 lb (73 kg)     Height 06/08/20 0949 5\' 2"  (1.575 m)     Head Circumference --      Peak Flow --      Pain Score 06/08/20 0949 0     Pain Loc --      Pain Edu? --      Excl. in [place]? --    No data found.  Updated Vital Signs BP (!) 148/77   Pulse (!) 59   Temp 98.9 F (37.2 C) (Oral)   Resp 16   Ht 5\' 2"  (1.575 m)   Wt 161 lb (73 kg)   SpO2 95%   BMI 29.45 kg/m   Visual Acuity Right Eye Distance:   Left Eye Distance:   Bilateral Distance:    Right Eye Near:   Left Eye Near:    Bilateral Near:     Physical Exam Vitals and nursing note reviewed.  Constitutional:      General: She is not in acute distress.    Appearance: She is well-developed and well-nourished.  HENT:     Head: Normocephalic and atraumatic.     Right Ear: Tympanic membrane normal.     Left Ear: Tympanic membrane normal.     Nose: Nose normal.     Mouth/Throat:     Mouth: Mucous membranes are moist.     Pharynx: Oropharynx is clear.  Eyes:     Conjunctiva/sclera: Conjunctivae normal.  Cardiovascular:     Rate and Rhythm: Normal rate and regular rhythm.     Heart sounds: Normal heart sounds.  Pulmonary:     Effort: Pulmonary effort is normal. No respiratory distress.     Breath sounds: Normal breath sounds.  Abdominal:     Palpations: Abdomen is soft.     Tenderness: There is  no abdominal tenderness. There is no guarding or rebound.  Musculoskeletal:        General: No edema.     Cervical back: Neck supple.  Skin:    General: Skin is warm and dry.     Findings: No rash.  Neurological:  General: No focal deficit present.     Mental Status: She is alert and oriented to person, place, and time.     Gait: Gait normal.  Psychiatric:        Mood and Affect: Mood and affect and mood normal.        Behavior: Behavior normal.      UC Treatments / Results  Labs (all labs ordered are listed, but only abnormal results are displayed) Labs Reviewed - No data to display  EKG   Radiology No results found.  Procedures Procedures (including critical care time)  Medications Ordered in UC Medications - No data to display  Initial Impression / Assessment and Plan / UC Course  I have reviewed the triage vital signs and the nursing notes.  Pertinent labs & imaging results that were available during my care of the patient were reviewed by me and considered in my medical decision making (see chart for details).   Acute bronchitis, cough.  Patient had a negative COVID test 3 days ago.  Treating with Zithromax and Tessalon Perles.  Instructed her to follow-up with her PCP if her symptoms are not improving.  She agrees to plan of care.   Final Clinical Impressions(s) / UC Diagnoses   Final diagnoses:  Acute bronchitis, unspecified organism  Cough     Discharge Instructions     Take the Zithromax and Tessalon Perles as directed.    Follow up with your primary care provider if your symptoms are not improving.        ED Prescriptions    Medication Sig Dispense Auth. Provider   azithromycin (ZITHROMAX) 250 MG tablet Take 1 tablet (250 mg total) by mouth daily. Take first 2 tablets together, then 1 every day until finished. 6 tablet Sharion Balloon, NP   benzonatate (TESSALON) 100 MG capsule Take 1 capsule (100 mg total) by mouth every 8 (eight) hours. 21  capsule Sharion Balloon, NP     PDMP not reviewed this encounter.   Sharion Balloon, NP 06/08/20 1008

## 2020-06-08 NOTE — ED Triage Notes (Signed)
Pt reports having sore, cough and some congestion x2 weeks. sts she was tested for covid on Saturday. Results were neg.

## 2020-06-08 NOTE — Discharge Instructions (Signed)
Take the Zithromax and Tessalon Perles as directed.    Follow up with your primary care provider if your symptoms are not improving.     

## 2020-06-08 NOTE — ED Notes (Signed)
Pt was seen this morning. Unsure why her name appeared again on the board.

## 2020-06-09 ENCOUNTER — Telehealth: Payer: Medicare Other | Admitting: Internal Medicine

## 2020-06-30 ENCOUNTER — Other Ambulatory Visit: Payer: Self-pay | Admitting: Internal Medicine

## 2020-07-28 ENCOUNTER — Telehealth: Payer: Self-pay | Admitting: Internal Medicine

## 2020-07-28 DIAGNOSIS — Z1231 Encounter for screening mammogram for malignant neoplasm of breast: Secondary | ICD-10-CM

## 2020-07-28 DIAGNOSIS — E119 Type 2 diabetes mellitus without complications: Secondary | ICD-10-CM

## 2020-07-28 NOTE — Telephone Encounter (Signed)
Patient called in wanted to know when she is suppose to come for a visit and about a mammogram.

## 2020-07-28 NOTE — Telephone Encounter (Signed)
Called and spoke to [name]. Lauren Morris states that she has not heard from anyone in regards to her mammogram. She states that it was ordered for her during her last visit here on 01/14/2020. She also states that she wants to know when Dr. Derrel Nip wants to see her again and states that she is usually seen only once a year. I have scheduled her for a physical on 01/13/2021 as it will be a year since her last in person visit.

## 2020-07-28 NOTE — Telephone Encounter (Signed)
SHE really should be seen twice a year since she is being followed for hypertension and diet controlled diabetes  Please schedule her a non fasting lab visit and OV to follow .  Labs ordered  She has not had a mammogram since 2019.  I have ordered it.  She is required to call to make  her appointment at [place] [phone]

## 2020-08-04 NOTE — Telephone Encounter (Signed)
Called [facility] and left a message to call back and schedule for a Non fasting lab and then a Office Visit the next week. Patient needs to be informed that she needs to call [facility] at [phone] to schedule an appointment for her Mammogram.

## 2020-08-05 NOTE — Telephone Encounter (Signed)
Pt called and scheduled appts  Pt would like to have her mammogram done at [facility] not [facility]

## 2020-08-11 ENCOUNTER — Other Ambulatory Visit (INDEPENDENT_AMBULATORY_CARE_PROVIDER_SITE_OTHER): Payer: Medicare Other

## 2020-08-11 ENCOUNTER — Other Ambulatory Visit: Payer: Self-pay

## 2020-08-11 DIAGNOSIS — K760 Fatty (change of) liver, not elsewhere classified: Secondary | ICD-10-CM | POA: Diagnosis not present

## 2020-08-11 DIAGNOSIS — E119 Type 2 diabetes mellitus without complications: Secondary | ICD-10-CM

## 2020-08-11 LAB — COMPREHENSIVE METABOLIC PANEL
ALT: 64 U/L — ABNORMAL HIGH (ref 0–35)
AST: 60 U/L — ABNORMAL HIGH (ref 0–37)
Albumin: 3.9 g/dL (ref 3.5–5.2)
Alkaline Phosphatase: 391 U/L — ABNORMAL HIGH (ref 39–117)
BUN: 14 mg/dL (ref 6–23)
CO2: 28 mEq/L (ref 19–32)
Calcium: 9.5 mg/dL (ref 8.4–10.5)
Chloride: 104 mEq/L (ref 96–112)
Creatinine, Ser: 0.73 mg/dL (ref 0.40–1.20)
GFR: 74.61 mL/min (ref >60.00)
Glucose, Bld: 111 mg/dL — ABNORMAL HIGH (ref 70–99)
Potassium: 4.4 mEq/L (ref 3.5–5.1)
Sodium: 137 mEq/L (ref 135–145)
Total Bilirubin: 0.8 mg/dL (ref 0.2–1.2)
Total Protein: 7.7 g/dL (ref 6.0–8.3)

## 2020-08-11 LAB — MICROALBUMIN / CREATININE URINE RATIO
Creatinine,U: 80.1 mg/dL
Microalb Creat Ratio: 2.8 mg/g (ref 0.0–30.0)
Microalb, Ur: 2.2 mg/dL — ABNORMAL HIGH (ref 0.0–1.9)

## 2020-08-11 LAB — HEPATIC FUNCTION PANEL
ALT: 64 U/L — ABNORMAL HIGH (ref 0–35)
AST: 60 U/L — ABNORMAL HIGH (ref 0–37)
Albumin: 3.9 g/dL (ref 3.5–5.2)
Alkaline Phosphatase: 391 U/L — ABNORMAL HIGH (ref 39–117)
Bilirubin, Direct: 0.3 mg/dL (ref 0.0–0.3)
Total Bilirubin: 0.8 mg/dL (ref 0.2–1.2)
Total Protein: 7.7 g/dL (ref 6.0–8.3)

## 2020-08-11 LAB — HEMOGLOBIN A1C: Hgb A1c MFr Bld: 5.8 % (ref 4.6–6.5)

## 2020-08-18 ENCOUNTER — Other Ambulatory Visit: Payer: Self-pay

## 2020-08-18 ENCOUNTER — Encounter: Payer: Self-pay | Admitting: Internal Medicine

## 2020-08-18 ENCOUNTER — Ambulatory Visit (INDEPENDENT_AMBULATORY_CARE_PROVIDER_SITE_OTHER): Payer: Medicare Other | Admitting: Internal Medicine

## 2020-08-18 VITALS — BP 110/62 | HR 74 | Temp 97.5°F | Ht 62.01 in | Wt 161.5 lb

## 2020-08-18 DIAGNOSIS — E6609 Other obesity due to excess calories: Secondary | ICD-10-CM | POA: Diagnosis not present

## 2020-08-18 DIAGNOSIS — Z7189 Other specified counseling: Secondary | ICD-10-CM

## 2020-08-18 DIAGNOSIS — E0500 Thyrotoxicosis with diffuse goiter without thyrotoxic crisis or storm: Secondary | ICD-10-CM | POA: Diagnosis not present

## 2020-08-18 DIAGNOSIS — Z6833 Body mass index (BMI) 33.0-33.9, adult: Secondary | ICD-10-CM

## 2020-08-18 DIAGNOSIS — E119 Type 2 diabetes mellitus without complications: Secondary | ICD-10-CM | POA: Diagnosis not present

## 2020-08-18 DIAGNOSIS — K760 Fatty (change of) liver, not elsewhere classified: Secondary | ICD-10-CM | POA: Diagnosis not present

## 2020-08-18 NOTE — Progress Notes (Signed)
 Subjective:  Patient ID: Lauren Morris, female    DOB: 03/08/1932  Age: 88 y.o. MRN: [id number]  CC: The primary encounter diagnosis was Do not resuscitate discussion. Diagnoses of Thyrotoxicosis due to Graves' disease, Class 1 obesity due to excess calories with serious comorbidity and body mass index (BMI) of 33.0 to 33.9 in adult, Fatty liver disease, nonalcoholic, and Diabetes mellitus without complication (HCC) were also pertinent to this visit.  HPI Lauren Morris presents for follow up on hypertension,  Hyperthyroidism with thyrotoxicosis  Managed with tapazole bid and atenolol by Endicrinology,  Type 2 DM with obesity and fatty liver  This visit occurred during the SARS-CoV-2 public health emergency.  Safety protocols were in place, including screening questions prior to the visit, additional usage of staff PPE, and extensive cleaning of exam room while observing appropriate contact time as indicated for disinfecting solutions.   Treated on Dec 15 by urgent care for bronchitis with cough suppressant (tessalon)  and azithromycin,[facility] NEGATIVE [facility]   3 days prior .  Symptoms have resolved  Obesity:  Her weight is unchanged..  Her ability to exercise is limited by dyspnea with walking long distances including parking lots .  Has handicapped sticker.    No dyspnea with ADLs. ,  Walks to mailbox and beyond in good weather for exercising   Hyperthyroid:  Taking methimazole bid.   Weight stable.  Appetite good.  No racing heart.   Hypertension: patient checks blood pressure  Once every few months at her sister's t home.  Readings have been for the most part < 120/70 at rest . Patient is following a reduced salt diet most days and is taking medications as prescribed.   Still taking atenolol 25 mg bid and amlodipine 10 mg daily   OSA :  Wearing CPAP for the last year.  Sleeping better and more energy.  Some nights doesn't tolerate it after coming back to bed after getting up to void.  Uses the  nasal pillow mask.  CPAP is   Managed by Dr Gonzalez   Fatty liver:   Liver enzymes remain elevated. She is still taking methimazole which Dr Medoff recommended stopping in June of last year/!    It is unclear if her endocrinologist received his communication, and she as not seen GI since last June,  Sees Endocrine next week    Outpatient Medications Prior to Visit  Medication Sig Dispense Refill  . Ascorbic Acid (VITAMIN C) 1000 MG tablet Take 1,000 mg by mouth daily.    . atenolol (TENORMIN) 25 MG tablet TAKE 1 TABLET BY MOUTH TWICE DAILY 60 tablet 4  . Calcium Carb-Cholecalciferol (CALCIUM 1000 + D PO) Take 1 tablet by mouth daily.    . dorzolamide-timolol (COSOPT) 22.3-6.8 MG/ML ophthalmic solution dorzolamide 22.3 mg-timolol 6.8 mg/mL eye drops    . fish oil-omega-3 fatty acids 1000 MG capsule Take 2 g by mouth daily.    . furosemide (LASIX) 20 MG tablet TAKE ONE TABLET BY MOUTH DAILY AS NEEDEDFOR FLUID RETENTION 30 tablet 0  . methimazole (TAPAZOLE) 5 MG tablet Take by mouth.    . tiotropium (SPIRIVA HANDIHALER) 18 MCG inhalation capsule Place 1 capsule (18 mcg total) into inhaler and inhale daily. 30 capsule 12  . amLODipine (NORVASC) 10 MG tablet TAKE 1 TABLET BY MOUTH DAILY 90 tablet 1  . azithromycin (ZITHROMAX) 250 MG tablet Take 1 tablet (250 mg total) by mouth daily. Take first 2 tablets together, then 1 every   day until finished. (Patient not taking: Reported on 08/18/2020) 6 tablet 0  . benzonatate (TESSALON) 100 MG capsule Take 1 capsule (100 mg total) by mouth every 8 (eight) hours. (Patient not taking: Reported on 08/18/2020) 21 capsule 0   No facility-administered medications prior to visit.    Review of Systems;  Patient denies headache, fevers, malaise, unintentional weight loss, skin rash, eye pain, sinus congestion and sinus pain, sore throat, dysphagia,  hemoptysis , cough, dyspnea, wheezing, chest pain, palpitations, orthopnea, edema, abdominal pain, nausea, melena,  diarrhea, constipation, flank pain, dysuria, hematuria, urinary  Frequency, nocturia, numbness, tingling, seizures,  Focal weakness, Loss of consciousness,  Tremor, insomnia, depression, anxiety, and suicidal ideation.      Objective:  BP 110/62 (BP Location: Left Arm, Patient Position: Sitting)   Pulse 74   Temp (!) 97.5 F (36.4 C)   Ht 5' 2.01" (1.575 m)   Wt 161 lb 8 oz (73.3 kg)   SpO2 95%   BMI 29.53 kg/m   BP Readings from Last 3 Encounters:  08/18/20 110/62  06/08/20 (!) 148/77  03/01/20 110/70    Wt Readings from Last 3 Encounters:  08/18/20 161 lb 8 oz (73.3 kg)  06/08/20 161 lb (73 kg)  03/01/20 162 lb (73.5 kg)    General appearance: alert, cooperative and appears stated age Ears: normal TM's and external ear canals both ears Throat: lips, mucosa, and tongue normal; teeth and gums normal Neck: no adenopathy, no carotid bruit, supple, symmetrical, trachea midline and thyroid not enlarged, symmetric, no tenderness/mass/nodules Back: symmetric, no curvature. ROM normal. No CVA tenderness. Lungs: clear to auscultation bilaterally Heart: regular rate and rhythm, S1, S2 normal, no murmur, click, rub or gallop Abdomen: soft, non-tender; bowel sounds normal; no masses,  no organomegaly Pulses: 2+ and symmetric Skin: Skin color, texture, turgor normal. No rashes or lesions Lymph nodes: Cervical, supraclavicular, and axillary nodes normal.  Lab Results  Component Value Date   HGBA1C 5.8 08/11/2020   HGBA1C 5.8 01/14/2020   HGBA1C 6.0 02/24/2019    Lab Results  Component Value Date   CREATININE 0.73 08/11/2020   CREATININE 0.70 01/14/2020   CREATININE 0.68 10/22/2019    Lab Results  Component Value Date   WBC 11.3 (H) 06/13/2018   HGB 16.5 (H) 06/13/2018   HCT 48.4 (H) 06/13/2018   PLT 222 06/13/2018   GLUCOSE 111 (H) 08/11/2020   CHOL 156 01/14/2020   TRIG 101.0 01/14/2020   HDL 51.30 01/14/2020   LDLDIRECT 80.0 04/08/2017   LDLCALC 85 01/14/2020    ALT 64 (H) 08/11/2020   ALT 64 (H) 08/11/2020   AST 60 (H) 08/11/2020   AST 60 (H) 08/11/2020   NA 137 08/11/2020   K 4.4 08/11/2020   CL 104 08/11/2020   CREATININE 0.73 08/11/2020   BUN 14 08/11/2020   CO2 28 08/11/2020   TSH 2.74 03/01/2020   HGBA1C 5.8 08/11/2020   MICROALBUR 2.2 (H) 08/11/2020    No results found.  Assessment & Plan:   Problem List Items Addressed This Visit      Unprioritized   Thyrotoxicosis due to Graves' disease    Her disease is managed with methimazole and beta blocker , but her liver enzymes have remained elevated.  Will notify her endocrinologist      Relevant Medications   methimazole (TAPAZOLE) 5 MG tablet   Obesity    Her ability to lose weight is hindered by her age,  Generalized weakness and dyspnea with exertion  Fatty liver disease, nonalcoholic    With persistent transaminitis and alk phos elevation resulting in  referral to GI. Current enzyme elevation attributed by Dr Earlean Shawl  To methimazole,  Which may have been transiently suspended in April but restarted (?) patient not sure   Liver enzymes are elevated again by comparison.   She has no symptoms of cirrhosis and no signs of cirrhosis or cholestasis on ultrasound  Lab Results  Component Value Date   ALT 64 (H) 08/11/2020   ALT 64 (H) 08/11/2020   AST 60 (H) 08/11/2020   AST 60 (H) 08/11/2020   ALKPHOS 391 (H) 08/11/2020   ALKPHOS 391 (H) 08/11/2020   BILITOT 0.8 08/11/2020   BILITOT 0.8 08/11/2020         Do not resuscitate discussion - Primary   Relevant Orders   DNR (Do Not Resuscitate)   Diabetes mellitus without complication (HCC)     hemoglobin A1c has been repeatedly  <6.0 without pharmacologic intervention.   Patient is  Up to date on eye exam and foot exam   There is  New onset microalbuminuria on current  micro urinalysis; however her blood pressure is low given use of atenolol for thyroid disease. .Will recommend change in HTN therapy:  Advised to dc   amlodipine and start ARB.   She is opposed to statin use .   Lab Results  Component Value Date   HGBA1C 5.8 08/11/2020   Lab Results  Component Value Date   MICROALBUR 2.2 (H) 08/11/2020   Lab Results  Component Value Date   CREATININE 0.73 08/11/2020         Relevant Medications   telmisartan (MICARDIS) 40 MG tablet      I have discontinued Disney M. Syfert's azithromycin, benzonatate, and amLODipine. I am also having her start on telmisartan. Additionally, I am having her maintain her Calcium Carb-Cholecalciferol (CALCIUM 1000 + D PO), fish oil-omega-3 fatty acids, dorzolamide-timolol, furosemide, vitamin C, Spiriva HandiHaler, atenolol, and methimazole.  Meds ordered this encounter  Medications  . telmisartan (MICARDIS) 40 MG tablet    Sig: Take 1 tablet (40 mg total) by mouth at bedtime.    Dispense:  90 tablet    Refill:  1    Medications Discontinued During This Encounter  Medication Reason  . benzonatate (TESSALON) 100 MG capsule Error  . azithromycin (ZITHROMAX) 250 MG tablet Error  . amLODipine (NORVASC) 10 MG tablet     Follow-up: Return in about 6 months (around 02/15/2021).   [name], MD

## 2020-08-18 NOTE — Patient Instructions (Signed)
Dr Earlean Shawl thinks that the methimazole is making your liver sick  Please discuss alternative medications with Dr Renne Crigler   I have signed the DO NOT RESUSCITATE ORDER  Based on our discussion today.  Please let [name] know how you feel   This can be torn up at any time if you change your mind

## 2020-08-20 ENCOUNTER — Telehealth: Payer: Self-pay | Admitting: Internal Medicine

## 2020-08-20 DIAGNOSIS — I10 Essential (primary) hypertension: Secondary | ICD-10-CM

## 2020-08-20 MED ORDER — TELMISARTAN 40 MG PO TABS: 40.0000 mg | ORAL_TABLET | Freq: Every day | ORAL | 1 refills | Status: DC

## 2020-08-20 NOTE — Assessment & Plan Note (Signed)
Her ability to lose weight is hindered by her age,  Generalized weakness and dyspnea with exertion

## 2020-08-20 NOTE — Assessment & Plan Note (Signed)
Her disease is managed with methimazole and beta blocker , but her liver enzymes have remained elevated.  Will notify her endocrinologist

## 2020-08-20 NOTE — Assessment & Plan Note (Addendum)
hemoglobin A1c has been repeatedly  <6.0 without pharmacologic intervention.   Patient is  Up to date on eye exam and foot exam   There is  New onset microalbuminuria on current  micro urinalysis; however her blood pressure is low given use of atenolol for thyroid disease. .Will recommend change in HTN therapy:  Advised to dc  amlodipine and start ARB.   She is opposed to statin use .   Lab Results  Component Value Date   HGBA1C 5.8 08/11/2020   Lab Results  Component Value Date   MICROALBUR 2.2 (H) 08/11/2020   Lab Results  Component Value Date   CREATININE 0.73 08/11/2020

## 2020-08-20 NOTE — Assessment & Plan Note (Addendum)
With persistent transaminitis and alk phos elevation resulting in  referral to GI. Current enzyme elevation attributed by Dr Earlean Shawl  To methimazole,  Which may have been transiently suspended in April but restarted (?) patient not sure   Liver enzymes are elevated again by comparison.   She has no symptoms of cirrhosis and no signs of cirrhosis or cholestasis on ultrasound  Lab Results  Component Value Date   ALT 64 (H) 08/11/2020   ALT 64 (H) 08/11/2020   AST 60 (H) 08/11/2020   AST 60 (H) 08/11/2020   ALKPHOS 391 (H) 08/11/2020   ALKPHOS 391 (H) 08/11/2020   BILITOT 0.8 08/11/2020   BILITOT 0.8 08/11/2020

## 2020-08-20 NOTE — Telephone Encounter (Signed)
In reviewing her recent labs more fully,  I noticed that Her urine test for  Microscopic protein was slightly abnormal/positive, which means that the diabetes is starting to affect her kidney function .  I would like her to stop the amlodipine and start telmisartan,  Which will provide protection of her kidneys and will also treat her blood pressure. She will need to have a BMET 1 week after starting medication to monitor for any changes in renal function or potassium level. She can have that done here,  Or at Dr Arman Filter office.  She does NOT need to fast for the blood test.   The telmisartan was sent to her pharmacy on Saturday Feb 26

## 2020-08-22 NOTE — Telephone Encounter (Signed)
LMTCB

## 2020-08-22 NOTE — Telephone Encounter (Signed)
Spoke with Lauren Morris and informed her of her lab results and medication change. Lauren Morris stated that she will have the lab done at Dr. Thayer Ohm office on 08/30/2020.

## 2020-08-30 ENCOUNTER — Other Ambulatory Visit: Payer: Self-pay

## 2020-08-30 ENCOUNTER — Encounter: Payer: Self-pay | Admitting: Internal Medicine

## 2020-08-30 ENCOUNTER — Ambulatory Visit (INDEPENDENT_AMBULATORY_CARE_PROVIDER_SITE_OTHER): Payer: Medicare Other | Admitting: Internal Medicine

## 2020-08-30 VITALS — BP 120/80 | HR 86 | Ht 62.0 in | Wt 165.6 lb

## 2020-08-30 DIAGNOSIS — E039 Hypothyroidism, unspecified: Secondary | ICD-10-CM | POA: Diagnosis not present

## 2020-08-30 DIAGNOSIS — E0500 Thyrotoxicosis with diffuse goiter without thyrotoxic crisis or storm: Secondary | ICD-10-CM | POA: Diagnosis not present

## 2020-08-30 DIAGNOSIS — I10 Essential (primary) hypertension: Secondary | ICD-10-CM

## 2020-08-30 DIAGNOSIS — E875 Hyperkalemia: Secondary | ICD-10-CM

## 2020-08-30 LAB — BASIC METABOLIC PANEL
BUN: 14 mg/dL (ref 6–23)
CO2: 30 mEq/L (ref 19–32)
Calcium: 9.8 mg/dL (ref 8.4–10.5)
Chloride: 100 mEq/L (ref 96–112)
Creatinine, Ser: 0.71 mg/dL (ref 0.40–1.20)
GFR: 77.11 mL/min (ref >60.00)
Glucose, Bld: 92 mg/dL (ref 70–99)
Potassium: 5.1 mEq/L (ref 3.5–5.1)
Sodium: 137 mEq/L (ref 135–145)

## 2020-08-30 LAB — T3, FREE: T3, Free: 3.4 pg/mL (ref 2.3–4.2)

## 2020-08-30 LAB — T4, FREE: Free T4: 0.81 ng/dL (ref 0.60–1.60)

## 2020-08-30 LAB — TSH: TSH: 4.38 u[IU]/mL (ref 0.35–4.50)

## 2020-08-30 NOTE — Progress Notes (Signed)
Patient ID: Lauren Morris, female   DOB: 03-18-1932, 87 y.o.   MRN: [id number]   This visit occurred during the SARS-CoV-2 public health emergency.  Safety protocols were in place, including screening questions prior to the visit, additional usage of staff PPE, and extensive cleaning of exam room while observing appropriate contact time as indicated for disinfecting solutions.   HPI  Lauren Morris is a 87 y.o.-year-old female, initially referred by her PCP, Dr. Derrel Nip, returning for follow-up for Graves' disease, with previous history of hypothyroidism.  She is here with her daughter Lauren Morris) who offers part of the history related to past medical history, symptoms, medications.  Reviewed history: Pt. has a history of hypothyroidism since at least 2012 >> she was on levothyroxine 25 mcg. However, in 02/2019, her TSH was found to be undetectably low and her levothyroxine dose was gradually reduced and finally stopped in 02/2019.  Despite this, her TSH remained low.   We started methimazole 5 mg daily in 05/2019, and then decrease to 2.5 mg daily.     Before last visit, due to concerns for transaminitis, and per GI recommendation, I advised her to stop methimazole  before our visit in 02/2020. She did so.   She is on atenolol 25 mg twice a day.  Reviewed her TFTs: Lab Results  Component Value Date   TSH 2.74 03/01/2020   TSH 2.14 12/31/2019   TSH 6.10 (H) 10/22/2019   TSH <0.01 (L) 06/23/2019   TSH 0.01 (L) 05/28/2019   TSH 0.01 (L) 04/13/2019   TSH <0.01 (L) 03/10/2019   TSH <0.01 (L) 02/24/2019   TSH 2.49 04/17/2018   TSH 2.11 10/16/2017   FREET4 0.95 03/01/2020   FREET4 0.86 12/31/2019   FREET4 0.70 10/22/2019   FREET4 1.39 06/23/2019   T3FREE 3.3 03/01/2020   T3FREE 3.2 12/31/2019   T3FREE 2.9 10/22/2019   T3FREE 4.2 06/23/2019   Her TSI antibodies were elevated: 03/10/2019: TPO antibodies 5 (<9) Lab Results  Component Value Date   THGAB <1 03/10/2019   Lab Results   Component Value Date   TSI 167 (H) 06/23/2019   Thyroid uptake and scan (09/17/2019): Homogeneous tracer distribution in both thyroid lobes. No focal areas of increased or decreased tracer localization.  4 hour I-123 uptake = 15.8% (normal 5-20%) 24 hour I-123 uptake = 34.6% (normal 10-30%)  IMPRESSION: Normal thyroid scan. Elevated 24 hour radio iodine uptake consistent with hyperthyroidism. Overall findings are consistent with Graves disease.  Before starting methimazole, she experienced weight loss-approximately 20 pounds in 10 months, fatigue, shortness of breath with exertion, hair loss.  She has no complaints at this visit.  Pt denies: - feeling nodules in neck - hoarseness - dysphagia - choking - SOB with lying down  No FH of thyroid disease or thyroid cancer. No h/o radiation tx to head or neck.  No seaweed or kelp. No recent contrast studies. No herbal supplements. No Biotin use. No recent steroids use.   She has a history of OSA and uses a CPAP.  She was taken off Amlodipine and started on Micardis 2/2 increased ACR.  ROS: Constitutional: no weight gain/no weight loss, + fatigue, no subjective hyperthermia, no subjective hypothermia, + nocturia, + poor sleep Eyes: no blurry vision, no xerophthalmia ENT: no sore throat, + see HPI Cardiovascular: no CP/no SOB/no palpitations/+ leg swelling Respiratory: no cough/no SOB/no wheezing Gastrointestinal: no N/no V/no D/no C/+ acid reflux Musculoskeletal: no muscle aches/no joint aches Skin: no rashes, no  hair loss Neurological: no tremors/no numbness/no tingling/no dizziness  I reviewed pt's medications, allergies, PMH, social hx, family hx, and changes were documented in the history of present illness. Otherwise, unchanged from my initial visit note.   Past Medical History:  Diagnosis Date  . Cyst    hx o on right breast  . DJD (degenerative joint disease)    right knee  . Glaucoma   . Hypertension   .  Sleep apnea   . Thyroid disease    Past Surgical History:  Procedure Laterality Date  . ABDOMINAL HYSTERECTOMY    . VITRECTOMY  July 2012   right eye with membrane peel [name], [place])   Social History   Socioeconomic History  . Marital status: Widowed    Spouse name: Not on file  . Number of children: 1  . Years of education: Not on file  . Highest education level: Not on file  Occupational History  .  Homemaker  Tobacco Use  . Smoking status: Never Smoker  . Smokeless tobacco: Never Used  Substance and Sexual Activity  . Alcohol use: No  . Drug use: No  . Sexual activity: Not on file  Other Topics Concern  . Not on file  Social History Narrative  . Not on file   Social Determinants of Health   Financial Resource Strain:   . Difficulty of Paying Living Expenses: Not on file  Food Insecurity:   . Worried About Charity fundraiser in the Last Year: Not on file  . Ran Out of Food in the Last Year: Not on file  Transportation Needs:   . Lack of Transportation (Medical): Not on file  . Lack of Transportation (Non-Medical): Not on file  Physical Activity:   . Days of Exercise per Week: Not on file  . Minutes of Exercise per Session: Not on file  Stress:   . Feeling of Stress : Not on file  Social Connections:   . Frequency of Communication with Friends and Family: Not on file  . Frequency of Social Gatherings with Friends and Family: Not on file  . Attends Religious Services: Not on file  . Active Member of Clubs or Organizations: Not on file  . Attends Archivist Meetings: Not on file  . Marital Status: Not on file  Intimate Partner Violence:   . Fear of Current or Ex-Partner: Not on file  . Emotionally Abused: Not on file  . Physically Abused: Not on file  . Sexually Abused: Not on file   On: [place] + Current Outpatient Medications on File Prior to Visit  Medication Sig Dispense Refill  . Ascorbic Acid (VITAMIN C) 1000 MG tablet Take 1,000  mg by mouth daily.    Marland Kitchen atenolol (TENORMIN) 25 MG tablet TAKE 1 TABLET BY MOUTH TWICE DAILY 60 tablet 4  . Calcium Carb-Cholecalciferol (CALCIUM 1000 + D PO) Take 1 tablet by mouth daily.    . dorzolamide-timolol (COSOPT) 22.3-6.8 MG/ML ophthalmic solution dorzolamide 22.3 mg-timolol 6.8 mg/mL eye drops    . fish oil-omega-3 fatty acids 1000 MG capsule Take 2 g by mouth daily.    . furosemide (LASIX) 20 MG tablet TAKE ONE TABLET BY MOUTH DAILY AS NEEDEDFOR FLUID RETENTION 30 tablet 0  . methimazole (TAPAZOLE) 5 MG tablet Take by mouth.    . telmisartan (MICARDIS) 40 MG tablet Take 1 tablet (40 mg total) by mouth at bedtime. 90 tablet 1  . tiotropium (SPIRIVA HANDIHALER) 18 MCG inhalation capsule Place  1 capsule (18 mcg total) into inhaler and inhale daily. 30 capsule 12   No current facility-administered medications on file prior to visit.   No Known Allergies Family History  Problem Relation Age of Onset  . Cancer Father   . Hypertension Father   . Heart attack Mother   . Hypertension Mother   Also, HTN mother and father, heart disease in mother.  PE: BP 120/80 (BP Location: Left Arm, Patient Position: Sitting, Cuff Size: Small)   Pulse 86   Ht 5\' 2"  (1.575 m)   Wt 165 lb 9.6 oz (75.1 kg)   SpO2 98%   BMI 30.29 kg/m  Wt Readings from Last 3 Encounters:  08/30/20 165 lb 9.6 oz (75.1 kg)  08/18/20 161 lb 8 oz (73.3 kg)  06/08/20 161 lb (73 kg)   Constitutional: overweight, in NAD Eyes: PERRLA, EOMI, no exophthalmos ENT: moist mucous membranes, no thyromegaly, no cervical lymphadenopathy Cardiovascular: RRR, No MRG Respiratory: CTA B Gastrointestinal: abdomen soft, NT, ND, BS+ Musculoskeletal: no deformities, strength intact in all 4 Skin: moist, warm, no rashes Neurological: no tremor with outstretched hands, DTR normal in all 4  ASSESSMENT: 1.  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 History of hypothyroidism  3. HTN  PLAN:  1.  Thyrotoxicosis due to Graves'  disease -Patient with history of hypothyroidism, but with development of thyrotoxicosis in 02/2019, which persisted despite stopping her levothyroxine.  She also has a history of significant weight loss of more than 20 pounds in 10 months before her Graves' disease were diagnosed.  She also developed tachycardia, shortness of breath with exertion, tremor. Her TFTs were in the thyrotoxic range and TSI's were elevated.  We checked a thyroid uptake and scan in 08/2019 and this was consistent with Graves' disease.  We started methimazole 5 mg daily and we were then able to decrease the dose of 2.5 mg daily afterwards.  She did very well on this medication, however, she had persistent transaminitis and her gastroenterologist, [name], recommended that she came off methimazole.  I advised her to do so, and she did before last visit.  However, I recently communicated with her PCP and she was worried that patient continues the low-dose methimazole.  At today's visit, I confirmed with the patient and her daughter that she is in fact off methimazole from before our last visit.  The source of confusion may have been the methimazole still appeared on her medication list.  I took it off today. -At this visit, we again discussed about possible modalities of treatment for Graves' disease to include RAI treatment, or surgery, since we cannot use thionamides.  However, I am hoping that her mild thyrotoxicosis remains controlled without intervention.  If TFTs worsen, my suggestion would be to go ahead with the RAI treatment.  They agree with the plan. -At this visit, she is still on atenolol.  At last visit pulse was low, but it is normal at today's visit -She denies Shirlee [place]ick ophthalmopathy signs: No double vision, blurry vision, eye pain, chemosis -I will see her back in 6 months but most likely sooner for labs. She can have these at next visit with Dr. Derrel Nip  2.  History of hypothyroidism -She has previously on a very low  dose of levothyroxine, 25 mcg daily, now off since 10/2018 -After Graves' disease resolves, we have to watch her closely as she can again develop hypothyroidism  3. HTN -Blood pressure medicines recently changed by PCP due to the presence  of microalbuminuria: From Norvasc to Micardis -We will check a BMP today per PCPs request  Component     Latest Ref Rng & Units 08/30/2020  Sodium     135 - 145 mEq/L 137  Potassium     3.5 - 5.1 mEq/L 5.1  Chloride     96 - 112 mEq/L 100  CO2     19 - 32 mEq/L 30  Glucose     70 - 99 mg/dL 92  BUN     6 - 23 mg/dL 14  Creatinine     0.40 - 1.20 mg/dL 0.71  Calcium     8.4 - 10.5 mg/dL 9.8  GFR     >60.00 mL/min 77.11  TSH     0.35 - 4.50 uIU/mL 4.38  T4,Free(Direct)     0.60 - 1.60 ng/dL 0.81  Triiodothyronine,Free,Serum     2.3 - 4.2 pg/mL 3.4   TFTs are normal. Potassium is at the upper limit of normal.  Will forward to PCP.  Philemon Kingdom, MD PhD [facility]

## 2020-08-30 NOTE — Patient Instructions (Signed)
Please stop at the lab.  Please also have thyroid labs checked at next OV with Dr. Derrel Nip.  Please come back for a follow-up appointment in 6 months.

## 2020-08-31 NOTE — Addendum Note (Signed)
Addended by: [name] on: 08/31/2020 01:40 PM   Modules accepted: Orders

## 2020-08-31 NOTE — Progress Notes (Signed)
Lauren Morris,   Thank you.  I may have to stop the telmisartan  given the increase, but  will have her repeat it in 1 weeks.  She has new onset proteinuria.   [name], please ask patient to return in one week for a repeat potassium check and a BP check

## 2020-09-01 ENCOUNTER — Telehealth: Payer: Self-pay

## 2020-09-01 NOTE — Telephone Encounter (Signed)
LMTCB. Need to schedule pt for a non fasting lab appt and a nurse visit for BP check in one week.

## 2020-09-07 ENCOUNTER — Ambulatory Visit (INDEPENDENT_AMBULATORY_CARE_PROVIDER_SITE_OTHER): Payer: Medicare Other | Admitting: *Deleted

## 2020-09-07 ENCOUNTER — Other Ambulatory Visit: Payer: Self-pay

## 2020-09-07 DIAGNOSIS — I10 Essential (primary) hypertension: Secondary | ICD-10-CM

## 2020-09-07 DIAGNOSIS — E875 Hyperkalemia: Secondary | ICD-10-CM

## 2020-09-07 LAB — RENAL FUNCTION PANEL
Albumin: 3.7 g/dL (ref 3.5–5.2)
BUN: 17 mg/dL (ref 6–23)
CO2: 29 mEq/L (ref 19–32)
Calcium: 10.2 mg/dL (ref 8.4–10.5)
Chloride: 100 mEq/L (ref 96–112)
Creatinine, Ser: 0.78 mg/dL (ref 0.40–1.20)
GFR: 68.87 mL/min (ref >60.00)
Glucose, Bld: 112 mg/dL — ABNORMAL HIGH (ref 70–99)
Phosphorus: 4.3 mg/dL (ref 2.3–4.6)
Potassium: 4.5 mEq/L (ref 3.5–5.1)
Sodium: 137 mEq/L (ref 135–145)

## 2020-09-07 NOTE — Progress Notes (Addendum)
Patient here for nurse visit BP check per order from 08/30/20 progress note..   Patient reports compliance with prescribed BP medications: yes Stopped amlodipine started Telmisartan 40mg .  Last dose of BP medication: 8AM  BP Readings from Last 3 Encounters:  09/07/20 138/68  08/30/20 120/80  08/18/20 110/62   Pulse Readings from Last 3 Encounters:  09/07/20 (!) 53  08/30/20 86  08/18/20 74      Patient verbalized understanding of instructions.   Kerin Salen, RN   BP is at goal,  continue current medications  Regards,   [name], MD

## 2020-09-29 ENCOUNTER — Other Ambulatory Visit: Payer: Self-pay | Admitting: Internal Medicine

## 2020-11-23 ENCOUNTER — Emergency Department: Payer: Medicare Other

## 2020-11-23 ENCOUNTER — Inpatient Hospital Stay
Admission: EM | Admit: 2020-11-23 | Discharge: 2020-11-27 | DRG: 177 | Disposition: A | Discharge status: Home Health | Payer: Medicare Other | Attending: Internal Medicine | Admitting: Internal Medicine

## 2020-11-23 ENCOUNTER — Other Ambulatory Visit: Payer: Self-pay

## 2020-11-23 DIAGNOSIS — I119 Hypertensive heart disease without heart failure: Secondary | ICD-10-CM | POA: Diagnosis present

## 2020-11-23 DIAGNOSIS — Y92239 Unspecified place in hospital as the place of occurrence of the external cause: Secondary | ICD-10-CM | POA: Diagnosis not present

## 2020-11-23 DIAGNOSIS — R29810 Facial weakness: Secondary | ICD-10-CM | POA: Diagnosis not present

## 2020-11-23 DIAGNOSIS — N390 Urinary tract infection, site not specified: Secondary | ICD-10-CM

## 2020-11-23 DIAGNOSIS — Z79899 Other long term (current) drug therapy: Secondary | ICD-10-CM

## 2020-11-23 DIAGNOSIS — E669 Obesity, unspecified: Secondary | ICD-10-CM | POA: Diagnosis present

## 2020-11-23 DIAGNOSIS — U071 COVID-19: Secondary | ICD-10-CM | POA: Diagnosis not present

## 2020-11-23 DIAGNOSIS — J9601 Acute respiratory failure with hypoxia: Secondary | ICD-10-CM | POA: Diagnosis not present

## 2020-11-23 DIAGNOSIS — Z8249 Family history of ischemic heart disease and other diseases of the circulatory system: Secondary | ICD-10-CM

## 2020-11-23 DIAGNOSIS — R509 Fever, unspecified: Secondary | ICD-10-CM | POA: Diagnosis not present

## 2020-11-23 DIAGNOSIS — Z66 Do not resuscitate: Secondary | ICD-10-CM | POA: Diagnosis present

## 2020-11-23 DIAGNOSIS — J1282 Pneumonia due to coronavirus disease 2019: Secondary | ICD-10-CM | POA: Diagnosis not present

## 2020-11-23 DIAGNOSIS — G4730 Sleep apnea, unspecified: Secondary | ICD-10-CM | POA: Diagnosis present

## 2020-11-23 DIAGNOSIS — N179 Acute kidney failure, unspecified: Secondary | ICD-10-CM | POA: Diagnosis not present

## 2020-11-23 DIAGNOSIS — T380X5A Adverse effect of glucocorticoids and synthetic analogues, initial encounter: Secondary | ICD-10-CM | POA: Diagnosis not present

## 2020-11-23 DIAGNOSIS — N3090 Cystitis, unspecified without hematuria: Secondary | ICD-10-CM | POA: Diagnosis present

## 2020-11-23 DIAGNOSIS — I152 Hypertension secondary to endocrine disorders: Secondary | ICD-10-CM

## 2020-11-23 DIAGNOSIS — R739 Hyperglycemia, unspecified: Secondary | ICD-10-CM | POA: Diagnosis not present

## 2020-11-23 DIAGNOSIS — R531 Weakness: Secondary | ICD-10-CM | POA: Diagnosis not present

## 2020-11-23 DIAGNOSIS — R0602 Shortness of breath: Secondary | ICD-10-CM | POA: Diagnosis not present

## 2020-11-23 DIAGNOSIS — E785 Hyperlipidemia, unspecified: Secondary | ICD-10-CM | POA: Diagnosis present

## 2020-11-23 DIAGNOSIS — E039 Hypothyroidism, unspecified: Secondary | ICD-10-CM | POA: Diagnosis present

## 2020-11-23 DIAGNOSIS — J410 Simple chronic bronchitis: Secondary | ICD-10-CM | POA: Diagnosis not present

## 2020-11-23 DIAGNOSIS — E871 Hypo-osmolality and hyponatremia: Secondary | ICD-10-CM | POA: Diagnosis present

## 2020-11-23 DIAGNOSIS — R748 Abnormal levels of other serum enzymes: Secondary | ICD-10-CM

## 2020-11-23 DIAGNOSIS — R7989 Other specified abnormal findings of blood chemistry: Secondary | ICD-10-CM

## 2020-11-23 DIAGNOSIS — Z6829 Body mass index (BMI) 29.0-29.9, adult: Secondary | ICD-10-CM

## 2020-11-23 DIAGNOSIS — E119 Type 2 diabetes mellitus without complications: Secondary | ICD-10-CM | POA: Diagnosis present

## 2020-11-23 DIAGNOSIS — R0902 Hypoxemia: Secondary | ICD-10-CM | POA: Diagnosis not present

## 2020-11-23 DIAGNOSIS — I10 Essential (primary) hypertension: Secondary | ICD-10-CM | POA: Diagnosis not present

## 2020-11-23 LAB — CBC WITH DIFFERENTIAL/PLATELET
Abs Immature Granulocytes: 0.03 10*3/uL (ref 0.00–0.07)
Basophils Absolute: 0 10*3/uL (ref 0.0–0.1)
Basophils Relative: 0 %
Eosinophils Absolute: 0 10*3/uL (ref 0.0–0.5)
Eosinophils Relative: 0 %
HCT: 45.1 % (ref 36.0–46.0)
Hemoglobin: 15 g/dL (ref 12.0–15.0)
Immature Granulocytes: 1 %
Lymphocytes Relative: 27 %
Lymphs Abs: 1.6 10*3/uL (ref 0.7–4.0)
MCH: 29.4 pg (ref 26.0–34.0)
MCHC: 33.3 g/dL (ref 30.0–36.0)
MCV: 88.4 fL (ref 80.0–100.0)
Monocytes Absolute: 0.9 10*3/uL (ref 0.1–1.0)
Monocytes Relative: 16 %
Neutro Abs: 3.1 10*3/uL (ref 1.7–7.7)
Neutrophils Relative %: 56 %
Platelets: 204 10*3/uL (ref 150–400)
RBC: 5.1 MIL/uL (ref 3.87–5.11)
RDW: 13.6 % (ref 11.5–15.5)
WBC: 5.7 10*3/uL (ref 4.0–10.5)
nRBC: 0 % (ref 0.0–0.2)

## 2020-11-23 LAB — URINALYSIS, COMPLETE (UACMP) WITH MICROSCOPIC
Bilirubin Urine: NEGATIVE
Glucose, UA: NEGATIVE mg/dL
Hgb urine dipstick: NEGATIVE
Ketones, ur: NEGATIVE mg/dL
Nitrite: POSITIVE — AB
Protein, ur: NEGATIVE mg/dL
Specific Gravity, Urine: 1.017 (ref 1.005–1.030)
pH: 5 (ref 5.0–8.0)

## 2020-11-23 LAB — RESP PANEL BY RT-PCR (FLU A&B, COVID) ARPGX2
Influenza A by PCR: NEGATIVE
Influenza B by PCR: NEGATIVE
SARS Coronavirus 2 by RT PCR: POSITIVE — AB

## 2020-11-23 LAB — COMPREHENSIVE METABOLIC PANEL
ALT: 84 U/L — ABNORMAL HIGH (ref 0–44)
AST: 115 U/L — ABNORMAL HIGH (ref 15–41)
Albumin: 3.5 g/dL (ref 3.5–5.0)
Alkaline Phosphatase: 331 U/L — ABNORMAL HIGH (ref 38–126)
Anion gap: 11 (ref 5–15)
BUN: 20 mg/dL (ref 8–23)
CO2: 21 mmol/L — ABNORMAL LOW (ref 22–32)
Calcium: 9 mg/dL (ref 8.9–10.3)
Chloride: 101 mmol/L (ref 98–111)
Creatinine, Ser: 0.77 mg/dL (ref 0.44–1.00)
GFR, Estimated: >60 mL/min (ref >60)
Glucose, Bld: 141 mg/dL — ABNORMAL HIGH (ref 70–99)
Potassium: 4.1 mmol/L (ref 3.5–5.1)
Sodium: 133 mmol/L — ABNORMAL LOW (ref 135–145)
Total Bilirubin: 0.8 mg/dL (ref 0.3–1.2)
Total Protein: 7.7 g/dL (ref 6.5–8.1)

## 2020-11-23 LAB — PROCALCITONIN: Procalcitonin: <0.1 ng/mL

## 2020-11-23 MED ORDER — PREDNISONE 20 MG PO TABS: 50.0000 mg | ORAL_TABLET | Freq: Every day | ORAL | Status: DC

## 2020-11-23 MED ORDER — DEXAMETHASONE SODIUM PHOSPHATE 10 MG/ML IJ SOLN
6.0000 mg | Freq: Once | INTRAMUSCULAR | Status: AC
  Administered 2020-11-23: 6 mg via INTRAVENOUS
  Filled 2020-11-23: qty 1

## 2020-11-23 MED ORDER — ASCORBIC ACID 500 MG PO TABS
1000.0000 mg | ORAL_TABLET | Freq: Every day | ORAL | Status: DC
  Administered 2020-11-24 – 2020-11-27 (×4): 1000 mg via ORAL
  Filled 2020-11-23 (×4): qty 2

## 2020-11-23 MED ORDER — ENOXAPARIN SODIUM 40 MG/0.4ML IJ SOSY
40.0000 mg | PREFILLED_SYRINGE | INTRAMUSCULAR | Status: DC
  Administered 2020-11-24: 40 mg via SUBCUTANEOUS
  Filled 2020-11-23: qty 0.4

## 2020-11-23 MED ORDER — MAGNESIUM HYDROXIDE 400 MG/5ML PO SUSP: 30.0000 mL | Freq: Every day | ORAL | Status: DC | PRN

## 2020-11-23 MED ORDER — METHYLPREDNISOLONE SODIUM SUCC 125 MG IJ SOLR
1.0000 mg/kg | Freq: Two times a day (BID) | INTRAMUSCULAR | Status: AC
  Administered 2020-11-24 – 2020-11-26 (×6): 72.5 mg via INTRAVENOUS
  Filled 2020-11-23 (×6): qty 2

## 2020-11-23 MED ORDER — ONDANSETRON HCL 4 MG PO TABS: 4.0000 mg | ORAL_TABLET | Freq: Four times a day (QID) | ORAL | Status: DC | PRN

## 2020-11-23 MED ORDER — SODIUM CHLORIDE 0.9 % IV SOLN
100.0000 mg | Freq: Every day | INTRAVENOUS | Status: DC
  Administered 2020-11-24 – 2020-11-26 (×3): 100 mg via INTRAVENOUS
  Filled 2020-11-23: qty 100
  Filled 2020-11-23: qty 20
  Filled 2020-11-23: qty 100
  Filled 2020-11-23: qty 20

## 2020-11-23 MED ORDER — IRBESARTAN 75 MG PO TABS: 75.0000 mg | ORAL_TABLET | Freq: Every day | ORAL | Status: DC

## 2020-11-23 MED ORDER — TRAZODONE HCL 50 MG PO TABS
25.0000 mg | ORAL_TABLET | Freq: Every evening | ORAL | Status: DC | PRN
  Administered 2020-11-24 – 2020-11-25 (×2): 25 mg via ORAL
  Filled 2020-11-23 (×2): qty 1

## 2020-11-23 MED ORDER — OMEGA-3-ACID ETHYL ESTERS 1 G PO CAPS
2.0000 g | ORAL_CAPSULE | Freq: Every day | ORAL | Status: DC
  Administered 2020-11-24 – 2020-11-27 (×4): 2 g via ORAL
  Filled 2020-11-23 (×4): qty 2

## 2020-11-23 MED ORDER — CALCIUM CARBONATE 1250 (500 CA) MG PO TABS
1.0000 | ORAL_TABLET | Freq: Every day | ORAL | Status: DC
  Administered 2020-11-24 – 2020-11-27 (×4): 500 mg via ORAL
  Filled 2020-11-23 (×4): qty 1

## 2020-11-23 MED ORDER — SODIUM CHLORIDE 0.9 % IV SOLN
2.0000 g | Freq: Once | INTRAVENOUS | Status: AC
  Administered 2020-11-23: 2 g via INTRAVENOUS
  Filled 2020-11-23: qty 20

## 2020-11-23 MED ORDER — ACETAMINOPHEN 325 MG PO TABS: 650.0000 mg | ORAL_TABLET | Freq: Four times a day (QID) | ORAL | Status: DC | PRN

## 2020-11-23 MED ORDER — GUAIFENESIN-DM 100-10 MG/5ML PO SYRP: 10.0000 mL | ORAL_SOLUTION | ORAL | Status: DC | PRN

## 2020-11-23 MED ORDER — HYDROCOD POLST-CPM POLST ER 10-8 MG/5ML PO SUER: 5.0000 mL | Freq: Two times a day (BID) | ORAL | Status: DC | PRN

## 2020-11-23 MED ORDER — ZINC SULFATE 220 (50 ZN) MG PO CAPS
220.0000 mg | ORAL_CAPSULE | Freq: Every day | ORAL | Status: DC
  Administered 2020-11-24 – 2020-11-27 (×4): 220 mg via ORAL
  Filled 2020-11-23 (×4): qty 1

## 2020-11-23 MED ORDER — SODIUM CHLORIDE 0.9 % IV SOLN
200.0000 mg | Freq: Once | INTRAVENOUS | Status: AC
  Administered 2020-11-24: 200 mg via INTRAVENOUS
  Filled 2020-11-23: qty 200

## 2020-11-23 MED ORDER — ONDANSETRON HCL 4 MG/2ML IJ SOLN: 4.0000 mg | Freq: Four times a day (QID) | INTRAMUSCULAR | Status: DC | PRN

## 2020-11-23 MED ORDER — DORZOLAMIDE HCL-TIMOLOL MAL 2-0.5 % OP SOLN
1.0000 [drp] | Freq: Two times a day (BID) | OPHTHALMIC | Status: DC
  Administered 2020-11-24 – 2020-11-27 (×7): 1 [drp] via OPHTHALMIC
  Filled 2020-11-23 (×2): qty 10

## 2020-11-23 MED ORDER — GUAIFENESIN ER 600 MG PO TB12
600.0000 mg | ORAL_TABLET | Freq: Two times a day (BID) | ORAL | Status: DC
  Administered 2020-11-24 – 2020-11-27 (×8): 600 mg via ORAL
  Filled 2020-11-23 (×8): qty 1

## 2020-11-23 MED ORDER — TIOTROPIUM BROMIDE MONOHYDRATE 18 MCG IN CAPS
18.0000 ug | ORAL_CAPSULE | Freq: Every day | RESPIRATORY_TRACT | Status: DC
  Administered 2020-11-24 – 2020-11-27 (×4): 18 ug via RESPIRATORY_TRACT
  Filled 2020-11-23: qty 5

## 2020-11-23 MED ORDER — ATENOLOL 25 MG PO TABS
25.0000 mg | ORAL_TABLET | Freq: Two times a day (BID) | ORAL | Status: DC
  Filled 2020-11-23: qty 1

## 2020-11-23 MED ORDER — SODIUM CHLORIDE 0.9 % IV SOLN
500.0000 mg | Freq: Once | INTRAVENOUS | Status: AC
  Administered 2020-11-23: 500 mg via INTRAVENOUS
  Filled 2020-11-23: qty 500

## 2020-11-23 MED ORDER — FUROSEMIDE 40 MG PO TABS: 20.0000 mg | ORAL_TABLET | Freq: Every day | ORAL | Status: DC | PRN

## 2020-11-23 MED ORDER — SODIUM CHLORIDE 0.9 % IV SOLN: INTRAVENOUS | Status: DC

## 2020-11-23 MED ORDER — FAMOTIDINE 20 MG PO TABS
20.0000 mg | ORAL_TABLET | Freq: Two times a day (BID) | ORAL | Status: DC
  Administered 2020-11-24 – 2020-11-27 (×8): 20 mg via ORAL
  Filled 2020-11-23 (×8): qty 1

## 2020-11-23 NOTE — Consult Note (Signed)
Remdesivir - Pharmacy Brief Note   O:  ALT: 84 CXR: 11/23/20 Mild bibasilar airspace disease which could represent COVID pneumonia. SpO2: 96% on 2L [place]   A/P:  Remdesivir 200 mg IVPB once followed by 100 mg IVPB daily x 4 days.   Dorothe Pea, PharmD, BCPS 11/23/2020 9:50 PM84

## 2020-11-23 NOTE — ED Notes (Signed)
Patient ambulated to restroom using cane and standby assist

## 2020-11-23 NOTE — ED Triage Notes (Addendum)
EMS brought in from home for generalized weakness today. Patient reports testing positive for COVID at home today. Was in contact with niece that tested positive for COVID Saturday. Denies CP/SOB/N/V at this time. Fever 102 reported by EMS, no meds given PTA. AAOx4. NAD.

## 2020-11-24 ENCOUNTER — Inpatient Hospital Stay: Payer: Medicare Other

## 2020-11-24 ENCOUNTER — Encounter: Payer: Self-pay | Admitting: Family Medicine

## 2020-11-24 DIAGNOSIS — I152 Hypertension secondary to endocrine disorders: Secondary | ICD-10-CM

## 2020-11-24 DIAGNOSIS — E669 Obesity, unspecified: Secondary | ICD-10-CM

## 2020-11-24 LAB — CBC WITH DIFFERENTIAL/PLATELET
Abs Immature Granulocytes: 0.01 10*3/uL (ref 0.00–0.07)
Basophils Absolute: 0 10*3/uL (ref 0.0–0.1)
Basophils Relative: 0 %
Eosinophils Absolute: 0 10*3/uL (ref 0.0–0.5)
Eosinophils Relative: 0 %
HCT: 45.5 % (ref 36.0–46.0)
Hemoglobin: 15.1 g/dL — ABNORMAL HIGH (ref 12.0–15.0)
Immature Granulocytes: 0 %
Lymphocytes Relative: 36 %
Lymphs Abs: 1.5 10*3/uL (ref 0.7–4.0)
MCH: 30.1 pg (ref 26.0–34.0)
MCHC: 33.2 g/dL (ref 30.0–36.0)
MCV: 90.8 fL (ref 80.0–100.0)
Monocytes Absolute: 0.2 10*3/uL (ref 0.1–1.0)
Monocytes Relative: 6 %
Neutro Abs: 2.3 10*3/uL (ref 1.7–7.7)
Neutrophils Relative %: 58 %
Platelets: 169 10*3/uL (ref 150–400)
RBC: 5.01 MIL/uL (ref 3.87–5.11)
RDW: 13.7 % (ref 11.5–15.5)
WBC: 4 10*3/uL (ref 4.0–10.5)
nRBC: 0 % (ref 0.0–0.2)

## 2020-11-24 LAB — COMPREHENSIVE METABOLIC PANEL
ALT: 75 U/L — ABNORMAL HIGH (ref 0–44)
AST: 87 U/L — ABNORMAL HIGH (ref 15–41)
Albumin: 3.2 g/dL — ABNORMAL LOW (ref 3.5–5.0)
Alkaline Phosphatase: 319 U/L — ABNORMAL HIGH (ref 38–126)
Anion gap: 11 (ref 5–15)
BUN: 18 mg/dL (ref 8–23)
CO2: 20 mmol/L — ABNORMAL LOW (ref 22–32)
Calcium: 8 mg/dL — ABNORMAL LOW (ref 8.9–10.3)
Chloride: 104 mmol/L (ref 98–111)
Creatinine, Ser: 0.65 mg/dL (ref 0.44–1.00)
GFR, Estimated: >60 mL/min (ref >60)
Glucose, Bld: 209 mg/dL — ABNORMAL HIGH (ref 70–99)
Potassium: 3.9 mmol/L (ref 3.5–5.1)
Sodium: 135 mmol/L (ref 135–145)
Total Bilirubin: 0.8 mg/dL (ref 0.3–1.2)
Total Protein: 7.4 g/dL (ref 6.5–8.1)

## 2020-11-24 LAB — MAGNESIUM: Magnesium: 2.1 mg/dL (ref 1.7–2.4)

## 2020-11-24 LAB — C-REACTIVE PROTEIN: CRP: 1 mg/dL — ABNORMAL HIGH (ref <1.0)

## 2020-11-24 LAB — FERRITIN: Ferritin: 253 ng/mL (ref 11–307)

## 2020-11-24 LAB — D-DIMER, QUANTITATIVE: D-Dimer, Quant: 2.49 ug/mL-FEU — ABNORMAL HIGH (ref 0.00–0.50)

## 2020-11-24 LAB — PROCALCITONIN: Procalcitonin: <0.1 ng/mL

## 2020-11-24 MED ORDER — HEPARIN BOLUS VIA INFUSION
5000.0000 [IU] | Freq: Once | INTRAVENOUS | Status: AC
  Administered 2020-11-24: 5000 [IU] via INTRAVENOUS
  Filled 2020-11-24: qty 5000

## 2020-11-24 MED ORDER — SODIUM CHLORIDE 0.9 % IV SOLN
1.0000 g | INTRAVENOUS | Status: DC
  Administered 2020-11-24 – 2020-11-26 (×3): 1 g via INTRAVENOUS
  Filled 2020-11-24: qty 1
  Filled 2020-11-24: qty 10
  Filled 2020-11-24: qty 1

## 2020-11-24 MED ORDER — IRBESARTAN 150 MG PO TABS
75.0000 mg | ORAL_TABLET | Freq: Every day | ORAL | Status: DC
  Administered 2020-11-24 – 2020-11-27 (×4): 75 mg via ORAL
  Filled 2020-11-24 (×4): qty 1

## 2020-11-24 MED ORDER — HEPARIN (PORCINE) 25000 UT/250ML-% IV SOLN
1100.0000 [IU]/h | INTRAVENOUS | Status: DC
  Administered 2020-11-24: 1100 [IU]/h via INTRAVENOUS
  Filled 2020-11-24: qty 250

## 2020-11-24 MED ORDER — TECHNETIUM TO 99M ALBUMIN AGGREGATED
4.0300 | Freq: Once | INTRAVENOUS | Status: AC | PRN
  Administered 2020-11-24: 4.03 via INTRAVENOUS

## 2020-11-24 NOTE — Progress Notes (Signed)
[place] for heparin infusion Indication: pulmonary embolus  No Known Allergies  Patient Measurements: Height: 5\' 2"  (157.5 cm) Weight: 72.6 kg (160 lb) IBW/kg (Calculated) : 50.1 Heparin Dosing Weight: 65.6 kg  Vital Signs: Temp: 97.5 F (36.4 C) (06/02 1434) Temp Source: Oral (06/02 1434) BP: 171/79 (06/02 1434) Pulse Rate: 62 (06/02 1434)  Labs: Recent Labs    11/23/20 1923 11/24/20 0538  HGB 15.0 15.1*  HCT 45.1 45.5  PLT 204 169  CREATININE 0.77 0.65    Estimated Creatinine Clearance: 47.1 mL/min (by C-G formula based on SCr of 0.65 mg/dL).   Medical History: Past Medical History:  Diagnosis Date  . Cyst    hx o on right breast  . DJD (degenerative joint disease)    right knee  . Glaucoma   . Hypertension   . Sleep apnea   . Thyroid disease     Medications:  Scheduled:  . vitamin C  1,000 mg Oral Daily  . calcium carbonate  1 tablet Oral Daily  . dorzolamide-timolol  1 drop Both Eyes BID  . famotidine  20 mg Oral BID  . guaiFENesin  600 mg Oral BID  . irbesartan  75 mg Oral Daily  . methylPREDNISolone (SOLU-MEDROL) injection  1 mg/kg Intravenous Q12H   Followed by  . [START ON 11/27/2020] predniSONE  50 mg Oral Daily  . omega-3 acid ethyl esters  2 g Oral Daily  . tiotropium  18 mcg Inhalation Daily  . zinc sulfate  220 mg Oral Daily    Assessment: 87 y.o.female with medical history significant for hypertension, sleep apnea and hypothyroidism as well as degenerative joint disease, presented to the ER with acute onset of generalized weakness. An NM pulmonary perfusion reveals high probability for pulmonary embolism. A review of her chart reveals no chronic anticoagulation prior to arrival. H&H, platelets wnl at baseline.  Goal of Therapy:  Heparin level 0.3-0.7 units/ml Monitor platelets by anticoagulation protocol: Yes   Plan:  Give 5000 units bolus x 1 Start heparin infusion at 1100 units/hr Check  anti-Xa level in 8 hours and daily while on heparin Continue to monitor H&H and platelets  Dallie Piles 11/24/2020,2:41 PM

## 2020-11-24 NOTE — Plan of Care (Signed)

## 2020-11-24 NOTE — ED Notes (Signed)
Pt resting comfortably in bed, given breakfast tray. Stretcher locked in low position with side rails up x2, call light and personal belongings in reach.

## 2020-11-24 NOTE — H&P (Addendum)
PATIENT NAME: Lauren Morris    MR#:  [id number]  DATE OF BIRTH:  January 03, 1932  DATE OF ADMISSION:  11/23/2020  PRIMARY CARE PHYSICIAN: [name], MD   Patient is coming from: Home.  REQUESTING/REFERRING PHYSICIAN: Brenton Grills, MD  CHIEF COMPLAINT:   Chief Complaint  Patient presents with  . Weakness    HISTORY OF PRESENT ILLNESS:  Lauren Morris is a 87 y.o. Caucasian female with medical history significant for hypertension, sleep apnea and hypothyroidism as well as degenerative joint disease, who presented to the ER with acute onset of generalized weakness today.  She was exposed to her niece who had [place] recently.  Today she had a home test that came back positive for COVID-19.  She admitted to fever without chills as well as cough occasionally productive of clear sputum, occasional dyspnea and wheezing.  She denied any nausea or vomiting or diarrhea.  No loss of taste or smell.  She denied any urinary frequency urgency or dysuria or hematuria or flank pain.  The patient had 2 Pfizer vaccines and 1 booster for COVID-19.  ED Course: Upon presentation to the ER blood pressure was 179/66 with pulse oximetry of 90% on room air and 96% on 2 L of O2 by nasal cannula.  Respiratory rate around was later 27.  Temperature was 99.5 and her T-max was 102.  Labs revealed mild hyponatremia and alk phos of 331 with AST of 150 ALT 84.  Procalcitonin was less than 0.1 CBC was unremarkable.  Influenza antigens came back negative and COVID-19 PCR came back positive.  UA showed 11-20 WBCs with many bacteria positive nitrite.  Urine culture was sent. EKG as reviewed by me : EKG showed normal sinus rhythm with a rate of 67 with left atrial enlargement and right axis deviation.  Imaging:  Portable chest x-ray showed mild bibasilar airspace disease that could represent COVID-pneumonia.  The patient was given IV Rocephin and Zithromax as well as IV remdesivir and 6 mg of IV Decadron.   She will be admitted to a medical monitored bed for further evaluation and management.  PAST MEDICAL HISTORY:   Past Medical History:  Diagnosis Date  . Cyst    hx o on right breast  . DJD (degenerative joint disease)    right knee  . Glaucoma   . Hypertension   . Sleep apnea   . Thyroid disease     PAST SURGICAL HISTORY:   Past Surgical History:  Procedure Laterality Date  . ABDOMINAL HYSTERECTOMY    . VITRECTOMY  July 2012   right eye with membrane peel [name], [place])    SOCIAL HISTORY:   Social History   Tobacco Use  . Smoking status: Never Smoker  . Smokeless tobacco: Never Used  Substance Use Topics  . Alcohol use: No    FAMILY HISTORY:   Family History  Problem Relation Age of Onset  . Cancer Father   . Hypertension Father   . Heart attack Mother   . Hypertension Mother     DRUG ALLERGIES:  No Known Allergies  REVIEW OF SYSTEMS:   ROS As per history of present illness. All pertinent systems were reviewed above. Constitutional, HEENT, cardiovascular, respiratory, GI, GU, musculoskeletal, neuro, psychiatric, endocrine, integumentary and hematologic systems were reviewed and are otherwise negative/unremarkable except for positive findings mentioned above in the HPI.   MEDICATIONS AT HOME:   Prior to Admission medications   Medication Sig Start  Date End Date Taking? Authorizing Provider  Ascorbic Acid (VITAMIN C) 1000 MG tablet Take 1,000 mg by mouth daily.    [provider]  atenolol (TENORMIN) 25 MG tablet TAKE 1 TABLET BY MOUTH TWICE DAILY 09/29/20   [name], MD  Calcium Carb-Cholecalciferol (CALCIUM 1000 + D PO) Take 1 tablet by mouth daily.    [provider]  dorzolamide-timolol (COSOPT) 22.3-6.8 MG/ML ophthalmic solution dorzolamide 22.3 mg-timolol 6.8 mg/mL eye drops    [provider]  fish oil-omega-3 fatty acids 1000 MG capsule Take 2 g by mouth daily.    [provider]  furosemide (LASIX) 20 MG  tablet TAKE ONE TABLET BY MOUTH DAILY AS NEEDEDFOR FLUID RETENTION 10/03/18   [name], MD  telmisartan (MICARDIS) 40 MG tablet Take 1 tablet (40 mg total) by mouth at bedtime. 08/20/20   [name], MD  tiotropium (SPIRIVA HANDIHALER) 18 MCG inhalation capsule Place 1 capsule (18 mcg total) into inhaler and inhale daily. 01/14/20   [name], MD      VITAL SIGNS:  Blood pressure 127/64, pulse 69, temperature 99.5 F (37.5 C), temperature source Oral, resp. rate (!) 25, height _0  (1.575 m), weight 72.6 kg, SpO2 91 %.  PHYSICAL EXAMINATION:  Physical Exam  GENERAL:  87 y.o.-year-old Caucasian female patient lying in the bed with no acute distress.  She has minimal respiratory distress with conversational dyspnea. EYES: Pupils equal, round, reactive to light and accommodation. No scleral icterus. Extraocular muscles intact.  HEENT: Head atraumatic, normocephalic. Oropharynx and nasopharynx clear.  NECK:  Supple, no jugular venous distention. No thyroid enlargement, no tenderness.  LUNGS: Diminished bibasilar breath sounds with bibasal crackles. CARDIOVASCULAR: Regular rate and rhythm, S1, S2 normal. No murmurs, rubs, or gallops.  ABDOMEN: Soft, nondistended, nontender. Bowel sounds present. No organomegaly or mass.  EXTREMITIES: No pedal edema, cyanosis, or clubbing.  NEUROLOGIC: Cranial nerves II through XII are intact. Muscle strength 5/5 in all extremities. Sensation intact. Gait not checked.  PSYCHIATRIC: The patient is alert and oriented x 3.  Normal affect and good eye contact. SKIN: No obvious rash, lesion, or ulcer.   LABORATORY PANEL:   CBC Recent Labs  Lab 11/23/20 1923  WBC 5.7  HGB 15.0  HCT 45.1  PLT 204   ------------------------------------------------------------------------------------------------------------------  Chemistries  Recent Labs  Lab 11/23/20 1923  NA 133*  K 4.1  CL 101  CO2 21*  GLUCOSE 141*  BUN 20  CREATININE 0.77   CALCIUM 9.0  AST 115*  ALT 84*  ALKPHOS 331*  BILITOT 0.8   ------------------------------------------------------------------------------------------------------------------  Cardiac Enzymes No results for input(s): TROPONINI in the last 168 hours. ------------------------------------------------------------------------------------------------------------------  RADIOLOGY:  DG Chest Portable 1 View  Result Date: 11/23/2020 CLINICAL DATA:  Weakness.  Positive COVID test today EXAM: PORTABLE CHEST 1 VIEW COMPARISON:  06/13/2018 FINDINGS: Mild patchy airspace disease in the lung bases appears more prominent than on prior study. No pleural effusion. Negative for heart failure. IMPRESSION: Mild bibasilar airspace disease which could represent COVID pneumonia. Electronically Signed   By: Franchot Gallo M.D.   On: 11/23/2020 20:13      IMPRESSION AND PLAN:  Active Problems:   Pneumonia due to COVID-19 virus   Obesity, diabetes, and hypertension syndrome ([place])  1.  Acute hypoxemic respiratory failure secondary to COVID-19. -The patient will be admitted to a medically monitored isolation bed. -O2 protocol will be followed to keep O2 saturation above 93.   2.  Multifocal pneumonia  secondary to COVID-19. -The patient will be admitted to an isolation monitored bed with droplet and contact precautions. -Given multifocal pneumonia we will empirically place the patient on IV Rocephin and Zithromax for possible bacterial superinfection only with elevated Procalcitonin. -The patient will be placed on scheduled Mucinex and as needed Tussionex. -We will avoid nebulization as much as we can, give bronchodilator MDI if needed, and with deterioration of oxygenation try to avoid BiPAP/CPAP if possible.    -Will obtain sputum Gram stain culture and sensitivity and follow blood cultures. -O2 protocol will be followed. -We will follow CRP, ferritin, LDH and D-dimer. -Will follow manual differential for  ANC/ALC ratio as well as follow troponin I and daily CBC with manual differential and CMP. - Will place the patient on IV Remdesivir and IV steroid therapy with IV Solu-Medrol with elevated inflammatory markers. -The patient will be placed on vitamin D3, vitamin C, zinc sulfate, p.o. Pepcid and aspirin.  3.  Acute kidney injury. - The patient will be hydrated with IV normal saline. - We will hold off his Micardis and nephrotoxins.  4.  Urinary tract infection. - We will continue the patient on IV Rocephin.  5.  Essential hypertension. - We will continue his Tenormin and hold off his Micardis.   DVT prophylaxis: Lovenox.   Code Status: The patient is DNR/DNI. Family Communication:  The plan of care was discussed in details with the patient (and family). I answered all questions. The patient agreed to proceed with the above mentioned plan. Further management will depend upon hospital course. Disposition Plan: Back to previous home environment Consults called: none.  All the records are reviewed and case discussed with ED provider.  Status is: Inpatient  Remains inpatient appropriate because:Ongoing diagnostic testing needed not appropriate for outpatient work up, Unsafe d/c plan, IV treatments appropriate due to intensity of illness or inability to take PO and Inpatient level of care appropriate due to severity of illness   Dispo: The patient is from: Home              Anticipated d/c is to: Home              Patient currently is not medically stable to d/c.   Difficult to place patient No  TOTAL TIME TAKING CARE OF THIS PATIENT: 55 minutes.    Christel Mormon M.D on 11/24/2020 at 12:49 AM  [facility]   From 7 PM-7 AM, contact night-coverage [email or web address]  CC: Primary care physician; [name], MD

## 2020-11-24 NOTE — ED Notes (Signed)
Pt's daughter [name] updated with pt's permission. All questions answered.

## 2020-11-24 NOTE — ED Provider Notes (Signed)
[facility] Emergency Department Provider Note  ____________________________________________  Time seen: Approximately 12:06 AM  I have reviewed the triage vital signs and the nursing notes.   HISTORY  Chief Complaint Weakness    HPI Lauren Morris is a 87 y.o. female with a history of obesity, diabetes, hypertension, chronic bronchitis who reports generalized weakness today.  She took a nap and after waking up midday, she felt short of breath, worse with walking, associated with nonproductive cough.  She has a family contact tested positive for COVID 4 days ago, and the patient took a COVID home test today and was positive.  Symptoms are constant.  No alleviating factors.  Denies chest pain.  No syncope.  EMS report a fever of 102.  No vomiting or diarrhea.  Patient has had 3 COVID vaccines  Past Medical History:  Diagnosis Date  . Cyst    hx o on right breast  . DJD (degenerative joint disease)    right knee  . Glaucoma   . Hypertension   . Sleep apnea   . Thyroid disease      Patient Active Problem List   Diagnosis Date Noted  . Obesity, diabetes, and hypertension syndrome ([place]) 11/24/2020  . Pneumonia due to COVID-19 virus 11/23/2020  . Chronic bronchitis ([place]) 01/16/2020  . Abnormal EKG 07/07/2019  . Generalized weakness 07/07/2019  . 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 Elevated hemoglobin ([place]) 06/15/2018  . Abnormal chest CT 05/25/2016  . Fatty liver disease, nonalcoholic 32/67/1245  . Obesity 11/13/2012  . Diabetes mellitus without complication ([place]) 80/99/8338  . Do not resuscitate discussion 03/11/2012  . Hyperlipidemia LDL goal <100 04/15/2011  . Essential hypertension 02/22/2011  . Acquired hypothyroidism 02/22/2011     Past Surgical History:  Procedure Laterality Date  . ABDOMINAL HYSTERECTOMY    . VITRECTOMY  July 2012   right eye with membrane peel [name], [place])     Prior to Admission medications    Medication Sig Start Date End Date Taking? Authorizing Provider  Ascorbic Acid (VITAMIN C) 1000 MG tablet Take 1,000 mg by mouth daily.    [provider]  atenolol (TENORMIN) 25 MG tablet TAKE 1 TABLET BY MOUTH TWICE DAILY 09/29/20   [name], MD  Calcium Carb-Cholecalciferol (CALCIUM 1000 + D PO) Take 1 tablet by mouth daily.    [provider]  dorzolamide-timolol (COSOPT) 22.3-6.8 MG/ML ophthalmic solution dorzolamide 22.3 mg-timolol 6.8 mg/mL eye drops    [provider]  fish oil-omega-3 fatty acids 1000 MG capsule Take 2 g by mouth daily.    [provider]  furosemide (LASIX) 20 MG tablet TAKE ONE TABLET BY MOUTH DAILY AS NEEDEDFOR FLUID RETENTION 10/03/18   [name], MD  telmisartan (MICARDIS) 40 MG tablet Take 1 tablet (40 mg total) by mouth at bedtime. 08/20/20   [name], MD  tiotropium (SPIRIVA HANDIHALER) 18 MCG inhalation capsule Place 1 capsule (18 mcg total) into inhaler and inhale daily. 01/14/20   [name], MD     Allergies Patient has no known allergies.   Family History  Problem Relation Age of Onset  . Cancer Father   . Hypertension Father   . Heart attack Mother   . Hypertension Mother     Social History Social History   Tobacco Use  . Smoking status: Never Smoker  . Smokeless tobacco: Never Used  Vaping Use  . Vaping Use: Never used  Substance Use Topics  .  Alcohol use: No  . Drug use: No    Review of Systems  Constitutional:   Positive fever and fatigue.  ENT:   No sore throat. No rhinorrhea. Cardiovascular:   No chest pain or syncope. Respiratory:   Positive shortness of breath and nonproductive cough. Gastrointestinal:   Negative for abdominal pain, vomiting and diarrhea.  Musculoskeletal:   Negative for focal pain or swelling All other systems reviewed and are negative except as documented above in ROS and HPI.  ____________________________________________   PHYSICAL  EXAM:  VITAL SIGNS: ED Triage Vitals  Enc Vitals Group     BP 11/23/20 1929 (!) 171/66     Pulse Rate 11/23/20 1929 68     Resp 11/23/20 1929 20     Temp 11/23/20 1931 99.5 F (37.5 C)     Temp Source 11/23/20 1931 Oral     SpO2 11/23/20 1929 90 %     Weight 11/23/20 1926 160 lb (72.6 kg)     Height 11/23/20 1926 5\' 2"  (1.575 m)     Head Circumference --      Peak Flow --      Pain Score 11/23/20 1927 0     Pain Loc --      Pain Edu? --      Excl. in [place]? --     Vital signs reviewed, nursing assessments reviewed.   Constitutional:   Alert and oriented. Non-toxic appearance. Eyes:   Conjunctivae are normal. EOMI. PERRL. ENT      Head:   Normocephalic and atraumatic.      Nose:   Wearing a mask.      Mouth/Throat:   Wearing a mask.      Neck:   No meningismus. Full ROM. Hematological/Lymphatic/Immunilogical:   No cervical lymphadenopathy. Cardiovascular:   RRR. Symmetric bilateral radial and DP pulses.  No murmurs. Cap refill less than 2 seconds. Respiratory:   Tachypnea, respiratory rate of 24. Breath sounds are clear and equal bilaterally. No wheezes/rales/rhonchi. Gastrointestinal:   Soft and nontender. Non distended. There is no CVA tenderness.  No rebound, rigidity, or guarding. Genitourinary:   deferred Musculoskeletal:   Normal range of motion in all extremities. No joint effusions.  No lower extremity tenderness.  No edema. Neurologic:   Normal speech and language.  Motor grossly intact. No acute focal neurologic deficits are appreciated.  Skin:    Skin is warm, dry and intact. No rash noted.  No petechiae, purpura, or bullae.  ____________________________________________    LABS (pertinent positives/negatives) (all labs ordered are listed, but only abnormal results are displayed) Labs Reviewed  RESP PANEL BY RT-PCR (FLU A&B, COVID) ARPGX2 - Abnormal; Notable for the following components:      Result Value   SARS Coronavirus 2 by RT PCR POSITIVE (*)    All  other components within normal limits  COMPREHENSIVE METABOLIC PANEL - Abnormal; Notable for the following components:   Sodium 133 (*)    CO2 21 (*)    Glucose, Bld 141 (*)    AST 115 (*)    ALT 84 (*)    Alkaline Phosphatase 331 (*)    All other components within normal limits  URINALYSIS, COMPLETE (UACMP) WITH MICROSCOPIC - Abnormal; Notable for the following components:   Color, Urine YELLOW (*)    APPearance HAZY (*)    Nitrite POSITIVE (*)    Leukocytes,Ua TRACE (*)    Bacteria, UA MANY (*)    All other components within normal limits  URINE CULTURE  EXPECTORATED SPUTUM ASSESSMENT W GRAM STAIN, RFLX TO RESP C  CBC WITH DIFFERENTIAL/PLATELET  PROCALCITONIN  CBC WITH DIFFERENTIAL/PLATELET  COMPREHENSIVE METABOLIC PANEL  C-REACTIVE PROTEIN  D-DIMER, QUANTITATIVE  FERRITIN   ____________________________________________   EKG    ____________________________________________    RADIOLOGY  DG Chest Portable 1 View  Result Date: 11/23/2020 CLINICAL DATA:  Weakness.  Positive COVID test today EXAM: PORTABLE CHEST 1 VIEW COMPARISON:  06/13/2018 FINDINGS: Mild patchy airspace disease in the lung bases appears more prominent than on prior study. No pleural effusion. Negative for heart failure. IMPRESSION: Mild bibasilar airspace disease which could represent COVID pneumonia. Electronically Signed   By: Franchot Gallo M.D.   On: 11/23/2020 20:13    ____________________________________________   PROCEDURES Procedures  ____________________________________________  DIFFERENTIAL DIAGNOSIS   Pneumonia, pleural effusion, pulmonary edema, COVID pneumonitis, dehydration, UTI  CLINICAL IMPRESSION / ASSESSMENT AND PLAN / ED COURSE  Medications ordered in the ED: Medications  remdesivir 200 mg in sodium chloride 0.9% 250 mL IVPB (has no administration in time range)    Followed by  remdesivir 100 mg in sodium chloride 0.9 % 100 mL IVPB (has no administration in time range)   azithromycin (ZITHROMAX) 500 mg in sodium chloride 0.9 % 250 mL IVPB (500 mg Intravenous New Bag/Given 11/23/20 2306)  atenolol (TENORMIN) tablet 25 mg (has no administration in time range)  furosemide (LASIX) tablet 20 mg (has no administration in time range)  irbesartan (AVAPRO) tablet 75 mg (has no administration in time range)  ascorbic acid (VITAMIN C) tablet 1,000 mg (has no administration in time range)  Calcium Carb-Cholecalciferol 1000-800 MG-UNIT TABS (has no administration in time range)  fish oil-omega-3 fatty acids capsule 2 g (has no administration in time range)  tiotropium (SPIRIVA) inhalation capsule ([facility] use ONLY) 18 mcg (has no administration in time range)  dorzolamide-timolol (COSOPT) 22.3-6.8 MG/ML ophthalmic solution 1 drop (has no administration in time range)  enoxaparin (LOVENOX) injection 40 mg (has no administration in time range)  0.9 %  sodium chloride infusion (has no administration in time range)  methylPREDNISolone sodium succinate (SOLU-MEDROL) 125 mg/2 mL injection 72.5 mg (has no administration in time range)    Followed by  predniSONE (DELTASONE) tablet 50 mg (has no administration in time range)  guaiFENesin-dextromethorphan (ROBITUSSIN DM) 100-10 MG/5ML syrup 10 mL (has no administration in time range)  chlorpheniramine-HYDROcodone (TUSSIONEX) 10-8 MG/5ML suspension 5 mL (has no administration in time range)  zinc sulfate capsule 220 mg (has no administration in time range)  famotidine (PEPCID) tablet 20 mg (has no administration in time range)  acetaminophen (TYLENOL) tablet 650 mg (has no administration in time range)  traZODone (DESYREL) tablet 25 mg (has no administration in time range)  magnesium hydroxide (MILK OF MAGNESIA) suspension 30 mL (has no administration in time range)  ondansetron (ZOFRAN) tablet 4 mg (has no administration in time range)    Or  ondansetron (ZOFRAN) injection 4 mg (has no administration in time range)  guaiFENesin  (MUCINEX) 12 hr tablet 600 mg (has no administration in time range)  dexamethasone (DECADRON) injection 6 mg (6 mg Intravenous Given 11/23/20 2112)  cefTRIAXone (ROCEPHIN) 2 g in sodium chloride 0.9 % 100 mL IVPB (0 g Intravenous Stopped 11/23/20 2306)    Pertinent labs & imaging results that were available during my care of the patient were reviewed by me and considered in my medical decision making (see chart for details).  TIMEKA GOETTE was evaluated in Emergency Department on 11/24/2020  for the symptoms described in the history of present illness. She was evaluated in the context of the global COVID-19 pandemic, which necessitated consideration that the patient might be at risk for infection with the SARS-CoV-2 virus that causes COVID-19. Institutional protocols and algorithms that pertain to the evaluation of patients at risk for COVID-19 are in a state of rapid change based on information released by regulatory bodies including the CDC and federal and state organizations. These policies and algorithms were followed during the patient's care in the ED.   Patient presents with shortness of breath, generalized weakness, reported positive COVID test at home and positive close contact.  Resting room air oxygen saturation is 90%, improved to 96% on 2 L nasal cannula.  COVID PCR is positive.  Chest x-ray viewed and interpreted by me and shows bilateral basilar infiltrates consistent with COVID-pneumonia.  Radiology report agrees.  Will start IV Decadron, IV remdesivir, plan to admit due to age and comorbidities and impending hypoxic respiratory failure.  Clinical Course as of 11/24/20 0012  Thu Nov 24, 2020  0011 Urinalysis shows nitrite positive UTI.  Rocephin ordered.  Procalcitonin negative, doubt bacterial pneumonia.  Can reduce antibiotic coverage to UTI only. [PS]    Clinical Course User Index [PS] Carrie Mew, MD     ____________________________________________   FINAL CLINICAL  IMPRESSION(S) / ED DIAGNOSES    Final diagnoses:  Acute hypoxemic respiratory failure due to COVID-19 [facility])  Cystitis  Obesity, diabetes, and hypertension syndrome ([place])  Simple chronic bronchitis [facility])     ED Discharge Orders    None      Portions of this note were generated with dragon dictation software. Dictation errors may occur despite best attempts at proofreading.   Carrie Mew, MD 11/24/20 [phone]

## 2020-11-24 NOTE — Progress Notes (Addendum)
PROGRESS NOTE    Lauren Morris  KGM:[id number] DOB: 20-Aug-1931 DOA: 11/23/2020 PCP: [name], MD   Brief Narrative: 87 year old with past medical history significant for hypertension, sleep apnea, hypothyroidism, who presents to the ED complaining of generalized weakness.  She reported being exposed to [place] from her niece.  Her COVID test came back positive.  She report fevers, occasional clear sputum, occasional dyspnea and wheezing.  She has had 2 Pfizer vaccine and 1 booster.  Evaluation in the ED: Blood pressure elevated 179/66 oxygen 90 on room air was placed on 2 L of oxygen.  Respiration rate 27.  T-max 102.  Transaminases.  COVID PCR positive.  UA with many white blood cells and positive nitrate. Chest x-ray showed mild bibasilar airspace disease that could represent COVID-pneumonia.  Patient admitted for COVID-pneumonia and acute hypoxic respiratory failure.  Assessment & Plan:   Active Problems:   Pneumonia due to COVID-19 virus   Obesity, diabetes, and hypertension syndrome ([place])  1-Acute Hypoxic Respiratory failure secondary to COVID-19 pneumonia: -Continue with oxygen supplementation -Continue with IV remdesivir and IVF steroids. -Procalcitonin not elevated -Albuterol as needed -Continue with vitamin supplements. -Monitor inflammatory markers COVID-19 Labs  Recent Labs    11/24/20 0538  DDIMER 2.49*  FERRITIN 253  CRP 1.0*    Lab Results  Component Value Date   SARSCOV2NAA POSITIVE (A) 11/23/2020   [place] Not Detected 12/25/2019   2-AKI correction, rule out    3-UTI; Continue with ceftriaxone.   HTN; Resume  micardis and hold  BB.   Bradycardia; Specially when she sleep. Plan to hold [place].  Asymptomatic.   Positive D dimer;  VQ scan; high probability for PE.  Will start Heparin Gtt.  Check Doppler LE.   Estimated body mass index is 29.26 kg/m as calculated from the following:   Height as of this encounter: 5\' 2"  (1.575 m).    Weight as of this encounter: 72.6 kg.   DVT prophylaxis: Lovenox Code Status: DNR Family Communication: Daughter updated.   Disposition Plan:  Status is: Inpatient  Remains inpatient appropriate because:IV treatments appropriate due to intensity of illness or inability to take PO   Dispo: The patient is from: Home              Anticipated d/c is to: Home              Patient currently is not medically stable to d/c.   Difficult to place patient No        Consultants:   None  Procedures:   VQ scan  Doppler.   Antimicrobials:    Subjective: She is sleepy, I wake her up. She answer questions. She couldn't sleep last night. She is breathing better. She denies chest pain, dyspnea and dizziness.    Objective: Vitals:   11/24/20 0400 11/24/20 0511 11/24/20 0546 11/24/20 0700  BP: 139/65 (!) 153/70  (!) 106/52  Pulse: 63 65  (!) 51  Resp: (!) 21 20  17   Temp:   97.8 F (36.6 C)   TempSrc:   Oral   SpO2: 92% 99%  93%  Weight:      Height:        Intake/Output Summary (Last 24 hours) at 11/24/2020 0817 Last data filed at 11/24/2020 0048 Gross per 24 hour  Intake 530.36 ml  Output --  Net 530.36 ml   Filed Weights   11/23/20 1926  Weight: 72.6 kg    Examination:  General exam: Appears calm  and comfortable  Respiratory system: Clear to auscultation. Respiratory effort normal. Cardiovascular system: S1 & S2 heard, RRR. No JVD, murmurs, rubs, gallops or clicks. No pedal edema. Gastrointestinal system: Abdomen is nondistended, soft and nontender. No organomegaly or masses felt. Normal bowel sounds heard. Central nervous system: Alert and oriented Extremities: Symmetric 5 x 5 power.   Data Reviewed: I have personally reviewed following labs and imaging studies  CBC: Recent Labs  Lab 11/23/20 1923 11/24/20 0538  WBC 5.7 4.0  NEUTROABS 3.1 2.3  HGB 15.0 15.1*  HCT 45.1 45.5  MCV 88.4 90.8  PLT 204 756   Basic Metabolic Panel: Recent Labs  Lab  11/23/20 1923 11/24/20 0538  NA 133* 135  K 4.1 3.9  CL 101 104  CO2 21* 20*  GLUCOSE 141* 209*  BUN 20 18  CREATININE 0.77 0.65  CALCIUM 9.0 8.0*   GFR: Estimated Creatinine Clearance: 47.1 mL/min (by C-G formula based on SCr of 0.65 mg/dL). Liver Function Tests: Recent Labs  Lab 11/23/20 1923 11/24/20 0538  AST 115* 87*  ALT 84* 75*  ALKPHOS 331* 319*  BILITOT 0.8 0.8  PROT 7.7 7.4  ALBUMIN 3.5 3.2*   No results for input(s): LIPASE, AMYLASE in the last 168 hours. No results for input(s): AMMONIA in the last 168 hours. Coagulation Profile: No results for input(s): INR, PROTIME in the last 168 hours. Cardiac Enzymes: No results for input(s): CKTOTAL, CKMB, CKMBINDEX, TROPONINI in the last 168 hours. BNP (last 3 results) No results for input(s): PROBNP in the last 8760 hours. HbA1C: No results for input(s): HGBA1C in the last 72 hours. CBG: No results for input(s): GLUCAP in the last 168 hours. Lipid Profile: No results for input(s): CHOL, HDL, LDLCALC, TRIG, CHOLHDL, LDLDIRECT in the last 72 hours. Thyroid Function Tests: No results for input(s): TSH, T4TOTAL, FREET4, T3FREE, THYROIDAB in the last 72 hours. Anemia Panel: Recent Labs    11/24/20 0538  FERRITIN 253   Sepsis Labs: Recent Labs  Lab 11/23/20 1923  PROCALCITON <0.10    Recent Results (from the past 240 hour(s))  Resp Panel by RT-PCR (Flu A&B, Covid) Nasopharyngeal Swab     Status: Abnormal   Collection Time: 11/23/20  9:15 PM   Specimen: Nasopharyngeal Swab; Nasopharyngeal(NP) swabs in vial transport medium  Result Value Ref Range Status   SARS Coronavirus 2 by RT PCR POSITIVE (A) NEGATIVE Final    Comment: RESULT CALLED TO, READ BACK BY AND VERIFIED WITH: YARELI [email or web address]@[email or web address]  11/23/20 RH (NOTE) SARS-CoV-2 target nucleic acids are DETECTED.  The SARS-CoV-2 RNA is generally detectable in upper respiratory specimens during the acute phase of infection. Positive results are indicative of the  presence of the identified virus, but do not rule out bacterial infection or co-infection with other pathogens not detected by the test. Clinical correlation with patient history and other diagnostic information is necessary to determine patient infection status. The expected result is Negative.  Fact Sheet for Patients: EntrepreneurPulse.com.au  Fact Sheet for Healthcare Providers: IncredibleEmployment.be  This test is not yet approved or cleared by the [place] FDA and  has been authorized for detection and/or diagnosis of SARS-CoV-2 by FDA under an Emergency Use Authorization (EUA).  This EUA will remain in effect (meaning this test can be used)  for the duration of  the COVID-19 declaration under Section 564(b)(1) of the Act, 21 U.S.C. section 360bbb-3(b)(1), unless the authorization is terminated or revoked sooner.     Influenza A by PCR NEGATIVE NEGATIVE Final  Influenza B by PCR NEGATIVE NEGATIVE Final    Comment: (NOTE) The Xpert Xpress SARS-CoV-2/FLU/RSV plus assay is intended as an aid in the diagnosis of influenza from Nasopharyngeal swab specimens and should not be used as a sole basis for treatment. Nasal washings and aspirates are unacceptable for Xpert Xpress SARS-CoV-2/FLU/RSV testing.  Fact Sheet for Patients: EntrepreneurPulse.com.au  Fact Sheet for Healthcare Providers: IncredibleEmployment.be  This test is not yet approved or cleared by the [place] FDA and has been authorized for detection and/or diagnosis of SARS-CoV-2 by FDA under an Emergency Use Authorization (EUA). This EUA will remain in effect (meaning this test can be used) for the duration of the COVID-19 declaration under Section 564(b)(1) of the Act, 21 U.S.C. section 360bbb-3(b)(1), unless the authorization is terminated or revoked.  Performed at [facility], [address] [place], [place]  [address]          Radiology Studies: DG Chest Portable 1 View  Result Date: 11/23/2020 CLINICAL DATA:  Weakness.  Positive COVID test today EXAM: PORTABLE CHEST 1 VIEW COMPARISON:  06/13/2018 FINDINGS: Mild patchy airspace disease in the lung bases appears more prominent than on prior study. No pleural effusion. Negative for heart failure. IMPRESSION: Mild bibasilar airspace disease which could represent COVID pneumonia. Electronically Signed   By: Franchot Gallo M.D.   On: 11/23/2020 20:13        Scheduled Meds: . vitamin C  1,000 mg Oral Daily  . atenolol  25 mg Oral BID  . Calcium Carb-Cholecalciferol   Oral Daily  . dorzolamide-timolol  1 drop Both Eyes BID  . enoxaparin (LOVENOX) injection  40 mg Subcutaneous Q24H  . famotidine  20 mg Oral BID  . fish oil-omega-3 fatty acids  2 g Oral Daily  . guaiFENesin  600 mg Oral BID  . irbesartan  75 mg Oral Daily  . methylPREDNISolone (SOLU-MEDROL) injection  1 mg/kg Intravenous Q12H   Followed by  . [START ON 11/27/2020] predniSONE  50 mg Oral Daily  . tiotropium  18 mcg Inhalation Daily  . zinc sulfate  220 mg Oral Daily   Continuous Infusions: . sodium chloride 100 mL/hr at 11/24/20 0049  . remdesivir 100 mg in NS 100 mL       LOS: 1 day    Time spent: 35 minutes    Beckem Tomberlin A Sherrika Weakland, MD [facility]   If 7PM-7AM, please contact night-coverage [email or web address]  11/24/2020, 8:17 AM

## 2020-11-24 NOTE — ED Notes (Signed)
Pt gone for VQ scan.

## 2020-11-25 ENCOUNTER — Inpatient Hospital Stay: Payer: Medicare Other

## 2020-11-25 LAB — COMPREHENSIVE METABOLIC PANEL
ALT: 60 U/L — ABNORMAL HIGH (ref 0–44)
AST: 57 U/L — ABNORMAL HIGH (ref 15–41)
Albumin: 3.1 g/dL — ABNORMAL LOW (ref 3.5–5.0)
Alkaline Phosphatase: 307 U/L — ABNORMAL HIGH (ref 38–126)
Anion gap: 9 (ref 5–15)
BUN: 20 mg/dL (ref 8–23)
CO2: 21 mmol/L — ABNORMAL LOW (ref 22–32)
Calcium: 8.1 mg/dL — ABNORMAL LOW (ref 8.9–10.3)
Chloride: 107 mmol/L (ref 98–111)
Creatinine, Ser: 0.65 mg/dL (ref 0.44–1.00)
GFR, Estimated: >60 mL/min (ref >60)
Glucose, Bld: 239 mg/dL — ABNORMAL HIGH (ref 70–99)
Potassium: 4.1 mmol/L (ref 3.5–5.1)
Sodium: 137 mmol/L (ref 135–145)
Total Bilirubin: 0.7 mg/dL (ref 0.3–1.2)
Total Protein: 7.3 g/dL (ref 6.5–8.1)

## 2020-11-25 LAB — CBC WITH DIFFERENTIAL/PLATELET
Abs Immature Granulocytes: 0.02 10*3/uL (ref 0.00–0.07)
Basophils Absolute: 0 10*3/uL (ref 0.0–0.1)
Basophils Relative: 0 %
Eosinophils Absolute: 0 10*3/uL (ref 0.0–0.5)
Eosinophils Relative: 0 %
HCT: 46.1 % — ABNORMAL HIGH (ref 36.0–46.0)
Hemoglobin: 15 g/dL (ref 12.0–15.0)
Immature Granulocytes: 0 %
Lymphocytes Relative: 36 %
Lymphs Abs: 2 10*3/uL (ref 0.7–4.0)
MCH: 29.3 pg (ref 26.0–34.0)
MCHC: 32.5 g/dL (ref 30.0–36.0)
MCV: 90 fL (ref 80.0–100.0)
Monocytes Absolute: 0.3 10*3/uL (ref 0.1–1.0)
Monocytes Relative: 6 %
Neutro Abs: 3.1 10*3/uL (ref 1.7–7.7)
Neutrophils Relative %: 58 %
Platelets: 196 10*3/uL (ref 150–400)
RBC: 5.12 MIL/uL — ABNORMAL HIGH (ref 3.87–5.11)
RDW: 13.2 % (ref 11.5–15.5)
WBC: 5.4 10*3/uL (ref 4.0–10.5)
nRBC: 0 % (ref 0.0–0.2)

## 2020-11-25 LAB — URINE CULTURE

## 2020-11-25 LAB — GLUCOSE, CAPILLARY
Glucose-Capillary: 165 mg/dL — ABNORMAL HIGH (ref 70–99)
Glucose-Capillary: 226 mg/dL — ABNORMAL HIGH (ref 70–99)

## 2020-11-25 LAB — GAMMA GT: GGT: 596 U/L — ABNORMAL HIGH (ref 7–50)

## 2020-11-25 LAB — HEPARIN LEVEL (UNFRACTIONATED)
Heparin Unfractionated: 0.81 IU/mL — ABNORMAL HIGH (ref 0.30–0.70)
Heparin Unfractionated: 0.94 IU/mL — ABNORMAL HIGH (ref 0.30–0.70)
Heparin Unfractionated: >1.1 IU/mL — ABNORMAL HIGH (ref 0.30–0.70)

## 2020-11-25 LAB — PROCALCITONIN: Procalcitonin: <0.1 ng/mL

## 2020-11-25 LAB — FERRITIN: Ferritin: 330 ng/mL — ABNORMAL HIGH (ref 11–307)

## 2020-11-25 LAB — D-DIMER, QUANTITATIVE: D-Dimer, Quant: 2.4 ug/mL-FEU — ABNORMAL HIGH (ref 0.00–0.50)

## 2020-11-25 LAB — C-REACTIVE PROTEIN: CRP: 0.8 mg/dL (ref <1.0)

## 2020-11-25 MED ORDER — INSULIN ASPART 100 UNIT/ML IJ SOLN
0.0000 [IU] | Freq: Three times a day (TID) | INTRAMUSCULAR | Status: DC
  Administered 2020-11-25: 2 [IU] via SUBCUTANEOUS
  Administered 2020-11-26: 3 [IU] via SUBCUTANEOUS
  Administered 2020-11-26 (×2): 2 [IU] via SUBCUTANEOUS
  Administered 2020-11-27: 1 [IU] via SUBCUTANEOUS
  Filled 2020-11-25 (×5): qty 1

## 2020-11-25 MED ORDER — HYDRALAZINE HCL 20 MG/ML IJ SOLN
5.0000 mg | Freq: Four times a day (QID) | INTRAMUSCULAR | Status: DC | PRN
  Administered 2020-11-27: 5 mg via INTRAVENOUS
  Filled 2020-11-25: qty 1

## 2020-11-25 MED ORDER — HEPARIN (PORCINE) 25000 UT/250ML-% IV SOLN
650.0000 [IU]/h | INTRAVENOUS | Status: AC
  Administered 2020-11-25: 900 [IU]/h via INTRAVENOUS
  Administered 2020-11-25: 800 [IU]/h via INTRAVENOUS
  Filled 2020-11-25: qty 250

## 2020-11-25 MED ORDER — ADULT MULTIVITAMIN W/MINERALS CH
1.0000 | ORAL_TABLET | Freq: Every day | ORAL | Status: DC
  Administered 2020-11-25 – 2020-11-27 (×3): 1 via ORAL
  Filled 2020-11-25 (×3): qty 1

## 2020-11-25 NOTE — Progress Notes (Signed)
Initial Nutrition Assessment  DOCUMENTATION CODES:  Not applicable  INTERVENTION:   Adjust diet to carb modified for elevated glucose, continue no salt packets added to trays  Monitor intake trends for need for supplement  MVI with minerals daily  NUTRITION DIAGNOSIS:  Increased nutrient needs related to acute illness (COVID19 infection) as evidenced by estimated needs.  GOAL:  Patient will meet greater than or equal to 90% of their needs  MONITOR:  PO intake,Labs  REASON FOR ASSESSMENT:  Malnutrition Screening Tool    ASSESSMENT:  Pt presented to ED with weakness, SOB, and cough. Positive for COVID19 on home test. Elevated BP and mildly hypoxic on room air in ED. PMH relevant for HTN and chronic bronchitis.  Discussed recent nutrition status with patient. Pt reports a good appetite, states she thinks that she is eating just as much, if not more than she does at home. Denies GI distress at this time and is not having any taste or smell changes. Encouraged continued PO intake and will monitor intake trends for need for supplement.   Nutritionally Relevant Medications: Scheduled Meds: . vitamin C  1,000 mg Oral Daily  . calcium carbonate  1 tablet Oral Daily  . famotidine  20 mg Oral BID  . insulin aspart  0-9 Units Subcutaneous TID WC  . irbesartan  75 mg Oral Daily  . methylPREDNISolone injection  1 mg/kg Intravenous Q12H  . omega-3 acid ethyl esters  2 g Oral Daily  . zinc sulfate  220 mg Oral Daily   PRN Meds: magnesium hydroxide, ondansetron   Labs reviewed:  SBG ranges from 209-239 mg/dL over the last 24 hours  Alk Phos 307, AST 57, ALT 60  Ferritin 596  NUTRITION - FOCUSED PHYSICAL EXAM: Defer due to isolation status  Diet Order:   Diet Order            Diet Carb Modified Fluid consistency: Thin; Room service appropriate? Yes  Diet effective now                EDUCATION NEEDS:  No education needs have been identified at this time  Skin:  Skin  Assessment: Reviewed RN Assessment  Last BM:  6/2 - per pt  Height:  Ht Readings from Last 1 Encounters:  11/23/20 _0  (1.575 m)   Weight:  Wt Readings from Last 1 Encounters:  11/23/20 72.6 kg    Ideal Body Weight:  50 kg  BMI:  Body mass index is 29.26 kg/m.  Estimated Nutritional Needs:   Kcal:  1500-1700 kcal  Protein:  75-90 g/d  Fluid:  >1500 mL/d   Ranell Patrick, RD, LDN Clinical Dietitian Pager on [place]

## 2020-11-25 NOTE — Progress Notes (Signed)
[place] for heparin infusion Indication: pulmonary embolus  No Known Allergies  Patient Measurements: Height: 5\' 2"  (157.5 cm) Weight: 72.6 kg (160 lb) IBW/kg (Calculated) : 50.1 Heparin Dosing Weight: 65.6 kg  Vital Signs: Temp: 97.3 F (36.3 C) (06/02 2356) Temp Source: Oral (06/02 2356) BP: 172/70 (06/02 2356) Pulse Rate: 57 (06/02 2356)  Labs: Recent Labs    11/23/20 1923 11/24/20 0538 11/25/20 0009  HGB 15.0 15.1* 15.0  HCT 45.1 45.5 46.1*  PLT 204 169 196  HEPARINUNFRC  --   --  >1.10*  CREATININE 0.77 0.65  --     Estimated Creatinine Clearance: 47.1 mL/min (by C-G formula based on SCr of 0.65 mg/dL).   Medical History: Past Medical History:  Diagnosis Date  . Cyst    hx o on right breast  . DJD (degenerative joint disease)    right knee  . Glaucoma   . Hypertension   . Sleep apnea   . Thyroid disease     Medications:  Scheduled:  . vitamin C  1,000 mg Oral Daily  . calcium carbonate  1 tablet Oral Daily  . dorzolamide-timolol  1 drop Both Eyes BID  . famotidine  20 mg Oral BID  . guaiFENesin  600 mg Oral BID  . irbesartan  75 mg Oral Daily  . methylPREDNISolone (SOLU-MEDROL) injection  1 mg/kg Intravenous Q12H   Followed by  . [START ON 11/27/2020] predniSONE  50 mg Oral Daily  . omega-3 acid ethyl esters  2 g Oral Daily  . tiotropium  18 mcg Inhalation Daily  . zinc sulfate  220 mg Oral Daily    Assessment: 87 y.o.female with medical history significant for hypertension, sleep apnea and hypothyroidism as well as degenerative joint disease, presented to the ER with acute onset of generalized weakness. An NM pulmonary perfusion reveals high probability for pulmonary embolism. A review of her chart reveals no chronic anticoagulation prior to arrival. H&H, platelets wnl at baseline.  Goal of Therapy:  Heparin level 0.3-0.7 units/ml Monitor platelets by anticoagulation protocol: Yes   6/03 0009 HL >1.1,  supratherapeutic   Plan:  Contacted RN, hold infusion for 1 hour Restart heparin infusion at 900 units/hr after 1 hour hold Recheck HL in 8 hours after restart Continue to monitor H&H and platelets  Renda Rolls, PharmD, MBA 11/25/2020 1:00 AM

## 2020-11-25 NOTE — Progress Notes (Signed)
[place] for heparin infusion Indication: pulmonary embolus  No Known Allergies  Patient Measurements: Height: 5\' 2"  (157.5 cm) Weight: 72.6 kg (160 lb) IBW/kg (Calculated) : 50.1 Heparin Dosing Weight: 65.6 kg  Vital Signs: Temp: 98.8 F (37.1 C) (06/03 1551) BP: 158/73 (06/03 1551) Pulse Rate: 53 (06/03 1551)  Labs: Recent Labs    11/23/20 1923 11/24/20 0538 11/25/20 0009 11/25/20 0945 11/25/20 1757  HGB 15.0 15.1* 15.0  --   --   HCT 45.1 45.5 46.1*  --   --   PLT 204 169 196  --   --   HEPARINUNFRC  --   --  >1.10* 0.94* 0.81*  CREATININE 0.77 0.65 0.65  --   --     Estimated Creatinine Clearance: 47.1 mL/min (by C-G formula based on SCr of 0.65 mg/dL).   Medical History: Past Medical History:  Diagnosis Date  . Cyst    hx o on right breast  . DJD (degenerative joint disease)    right knee  . Glaucoma   . Hypertension   . Sleep apnea   . Thyroid disease     Medications:  Scheduled:  . vitamin C  1,000 mg Oral Daily  . calcium carbonate  1 tablet Oral Daily  . dorzolamide-timolol  1 drop Both Eyes BID  . famotidine  20 mg Oral BID  . guaiFENesin  600 mg Oral BID  . insulin aspart  0-9 Units Subcutaneous TID WC  . irbesartan  75 mg Oral Daily  . methylPREDNISolone (SOLU-MEDROL) injection  1 mg/kg Intravenous Q12H   Followed by  . [START ON 11/27/2020] predniSONE  50 mg Oral Daily  . multivitamin with minerals  1 tablet Oral Daily  . omega-3 acid ethyl esters  2 g Oral Daily  . tiotropium  18 mcg Inhalation Daily  . zinc sulfate  220 mg Oral Daily    Assessment: 87 y.o.female with medical history significant for hypertension, sleep apnea and hypothyroidism as well as degenerative joint disease, presented to the ER with acute onset of generalized weakness. An NM pulmonary perfusion reveals high probability for pulmonary embolism. A review of her chart reveals no chronic anticoagulation prior to arrival. H&H,  platelets wnl at baseline.  Goal of Therapy:  Heparin level 0.3-0.7 units/ml Monitor platelets by anticoagulation protocol: Yes   6/03 0009 HL > 1.1, supratherapeutic, held, decreased to 900 units/hr 6/03 0945 HL 0.94, supratherapeutic decreased to 800 units/hr  6/03 1757 HL 0.81, supratherapeutic   Plan:  Reduce heparin infusion to 650 units/hr  Recheck HL in 8 hours following rate change Continue to monitor H&H and platelets  Dorothe Pea, PharmD, BCPS Clinical Pharmacist  11/25/2020 6:43 PM

## 2020-11-25 NOTE — Progress Notes (Signed)
PROGRESS NOTE    Lauren Morris  YSA:[id number] DOB: [birth year] DOA: 11/23/2020 PCP: [name], MD   Brief Narrative: 87 year old with past medical history significant for hypertension, sleep apnea, hypothyroidism, who presents to the ED complaining of generalized weakness.  She reported being exposed to [place] from her niece.  Her COVID test came back positive.  She report fevers, occasional clear sputum, occasional dyspnea and wheezing.  She has had 2 Pfizer vaccine and 1 booster.  Evaluation in the ED: Blood pressure elevated 179/66 oxygen 90 on room air was placed on 2 L of oxygen.  Respiration rate 27.  T-max 102.  Transaminases.  COVID PCR positive.  UA with many white blood cells and positive nitrate. Chest x-ray showed mild bibasilar airspace disease that could represent COVID-pneumonia.  Patient admitted for COVID-pneumonia and acute hypoxic respiratory failure.  Assessment & Plan:   Active Problems:   Pneumonia due to COVID-19 virus   Obesity, diabetes, and hypertension syndrome (HCC)  1-Acute Hypoxic Respiratory failure secondary to COVID-19 pneumonia: -Continue with oxygen supplementation -Continue with IV remdesivir day 2 and IVF steroids. -Procalcitonin not elevated -Albuterol as needed -Continue with vitamin supplements. -Monitor inflammatory markers, trending down.   COVID-19 Labs  Recent Labs    11/24/20 0538 11/25/20 0009  DDIMER 2.49* 2.40*  FERRITIN 253 330*  CRP 1.0* 0.8    Lab Results  Component Value Date   SARSCOV2NAA POSITIVE (A) 11/23/2020   [place] Not Detected 12/25/2019   2-AKI correction, rule out    3-UTI; Continue with ceftriaxone. Day 2/3  HTN; Resume  micardis and hold  BB.   Bradycardia; Specially when she sleep. Plan to hold Tenomir.  Asymptomatic.   PE;  Positive D dimer;  VQ scan; high probability for PE.  Continue with Heparin Gtt.  Doppler LE negaive  Transaminases;  Chronic. Presume related to methimazole.  Patient will need to follow up with Dr [name].  GTT elevated.  Trending down.  Per Dr Vanita Ingles, [place] without evidence of cholestasis or cirrhosis.   Hyperglycemia; related to steroids.  SSI    Estimated body mass index is 29.26 kg/m as calculated from the following:   Height as of this encounter: 5\' 2"  (1.575 m).   Weight as of this encounter: 72.6 kg.   DVT prophylaxis: Lovenox Code Status: DNR Family Communication: Daughter updated 6/03  Disposition Plan:  Status is: Inpatient  Remains inpatient appropriate because:IV treatments appropriate due to intensity of illness or inability to take PO   Dispo: The patient is from: Home              Anticipated d/c is to: Home              Patient currently is not medically stable to d/c.   Difficult to place patient No        Consultants:   None  Procedures:   VQ scan  Doppler.   Antimicrobials:    Subjective: She is feeling well. Breathing better.    Objective: Vitals:   11/24/20 2356 11/25/20 0613 11/25/20 1028 11/25/20 1222  BP: (!) 172/70 (!) 156/68 (!) 143/89 (!) 169/73  Pulse: (!) 57 (!) 52 (!) 59 (!) 57  Resp: 20 17 16 18   Temp: (!) 97.3 F (36.3 C) (!) 97.5 F (36.4 C)  97.8 F (36.6 C)  TempSrc: Oral Oral    SpO2: 98% 96% 96% 95%  Weight:      Height:        Intake/Output  Summary (Last 24 hours) at 11/25/2020 1335 Last data filed at 11/25/2020 1311 Gross per 24 hour  Intake 3263.64 ml  Output 2200 ml  Net 1063.64 ml   Filed Weights   11/23/20 1926  Weight: 72.6 kg    Examination:  General exam: NAD Respiratory system: CTA Cardiovascular system: S 1, S 2 RRR Gastrointestinal system: BS present, soft, nt Central nervous system: Alert Extremities: Symmetric power.    Data Reviewed: I have personally reviewed following labs and imaging studies  CBC: Recent Labs  Lab 11/23/20 1923 11/24/20 0538 11/25/20 0009  WBC 5.7 4.0 5.4  NEUTROABS 3.1 2.3 3.1  HGB 15.0 15.1* 15.0  HCT 45.1  45.5 46.1*  MCV 88.4 90.8 90.0  PLT 204 169 323   Basic Metabolic Panel: Recent Labs  Lab 11/23/20 1923 11/24/20 0538 11/24/20 1012 11/25/20 0009  NA 133* 135  --  137  K 4.1 3.9  --  4.1  CL 101 104  --  107  CO2 21* 20*  --  21*  GLUCOSE 141* 209*  --  239*  BUN 20 18  --  20  CREATININE 0.77 0.65  --  0.65  CALCIUM 9.0 8.0*  --  8.1*  MG  --   --  2.1  --    GFR: Estimated Creatinine Clearance: 47.1 mL/min (by C-G formula based on SCr of 0.65 mg/dL). Liver Function Tests: Recent Labs  Lab 11/23/20 1923 11/24/20 0538 11/25/20 0009  AST 115* 87* 57*  ALT 84* 75* 60*  ALKPHOS 331* 319* 307*  BILITOT 0.8 0.8 0.7  PROT 7.7 7.4 7.3  ALBUMIN 3.5 3.2* 3.1*   No results for input(s): LIPASE, AMYLASE in the last 168 hours. No results for input(s): AMMONIA in the last 168 hours. Coagulation Profile: No results for input(s): INR, PROTIME in the last 168 hours. Cardiac Enzymes: No results for input(s): CKTOTAL, CKMB, CKMBINDEX, TROPONINI in the last 168 hours. BNP (last 3 results) No results for input(s): PROBNP in the last 8760 hours. HbA1C: No results for input(s): HGBA1C in the last 72 hours. CBG: No results for input(s): GLUCAP in the last 168 hours. Lipid Profile: No results for input(s): CHOL, HDL, LDLCALC, TRIG, CHOLHDL, LDLDIRECT in the last 72 hours. Thyroid Function Tests: No results for input(s): TSH, T4TOTAL, FREET4, T3FREE, THYROIDAB in the last 72 hours. Anemia Panel: Recent Labs    11/24/20 0538 11/25/20 0009  FERRITIN 253 330*   Sepsis Labs: Recent Labs  Lab 11/23/20 1923 11/24/20 1012 11/25/20 0009  PROCALCITON <0.10 <0.10 <0.10    Recent Results (from the past 240 hour(s))  Urine Culture     Status: Abnormal   Collection Time: 11/23/20  7:37 PM   Specimen: Urine, Clean Catch  Result Value Ref Range Status   Specimen Description   Final    URINE, CLEAN CATCH Performed at [facility], [address], [place], [place]  [address]    Special Requests   Final    NONE Performed at [facility], [address] [place], [place] [address]    Culture MULTIPLE SPECIES PRESENT, SUGGEST RECOLLECTION (A)  Final   Report Status 11/25/2020 FINAL  Final  Resp Panel by RT-PCR (Flu A&B, Covid) Nasopharyngeal Swab     Status: Abnormal   Collection Time: 11/23/20  9:15 PM   Specimen: Nasopharyngeal Swab; Nasopharyngeal(NP) swabs in vial transport medium  Result Value Ref Range Status   SARS Coronavirus 2 by RT PCR POSITIVE (A) NEGATIVE Final  Comment: RESULT CALLED TO, READ BACK BY AND VERIFIED WITH: YARELI [email or web address]@[email or web address]  11/23/20 RH (NOTE) SARS-CoV-2 target nucleic acids are DETECTED.  The SARS-CoV-2 RNA is generally detectable in upper respiratory specimens during the acute phase of infection. Positive results are indicative of the presence of the identified virus, but do not rule out bacterial infection or co-infection with other pathogens not detected by the test. Clinical correlation with patient history and other diagnostic information is necessary to determine patient infection status. The expected result is Negative.  Fact Sheet for Patients: EntrepreneurPulse.com.au  Fact Sheet for Healthcare Providers: IncredibleEmployment.be  This test is not yet approved or cleared by the [place] FDA and  has been authorized for detection and/or diagnosis of SARS-CoV-2 by FDA under an Emergency Use Authorization (EUA).  This EUA will remain in effect (meaning this test can be used)  for the duration of  the COVID-19 declaration under Section 564(b)(1) of the Act, 21 U.S.C. section 360bbb-3(b)(1), unless the authorization is terminated or revoked sooner.     Influenza A by PCR NEGATIVE NEGATIVE Final   Influenza B by PCR NEGATIVE NEGATIVE Final    Comment: (NOTE) The Xpert Xpress SARS-CoV-2/FLU/RSV plus assay is intended as an aid in the diagnosis of influenza from  Nasopharyngeal swab specimens and should not be used as a sole basis for treatment. Nasal washings and aspirates are unacceptable for Xpert Xpress SARS-CoV-2/FLU/RSV testing.  Fact Sheet for Patients: EntrepreneurPulse.com.au  Fact Sheet for Healthcare Providers: IncredibleEmployment.be  This test is not yet approved or cleared by the [place] FDA and has been authorized for detection and/or diagnosis of SARS-CoV-2 by FDA under an Emergency Use Authorization (EUA). This EUA will remain in effect (meaning this test can be used) for the duration of the COVID-19 declaration under Section 564(b)(1) of the Act, 21 U.S.C. section 360bbb-3(b)(1), unless the authorization is terminated or revoked.  Performed at [facility], [address], [place], [place] [address]          Radiology Studies: NM Pulmonary Perfusion  Result Date: 11/24/2020 CLINICAL DATA:  Shortness of breath.  COVID-19 positive EXAM: NUCLEAR MEDICINE PERFUSION LUNG SCAN TECHNIQUE: Perfusion images were obtained in multiple projections after intravenous injection of radiopharmaceutical. Ventilation scans intentionally deferred if perfusion scan and chest x-ray adequate for interpretation during COVID 19 epidemic. RADIOPHARMACEUTICALS:  4.0 mCi Tc-8m MAA IV COMPARISON:  11/23/2020 FINDINGS: Somewhat heterogeneous distribution of radiotracer within the bilateral lung fields. Multiple wedge-shaped perfusion defects of the peripheral aspect of both lung fields including bilateral apical and left lateral segmental defects. IMPRESSION: High probability for pulmonary embolism. Electronically Signed   By: Davina Poke D.O.   On: 11/24/2020 12:43   US Venous Img Lower Bilateral (DVT)  Result Date: 11/25/2020 CLINICAL DATA:  Positive D-dimer Positive nuclear medicine pulmonary perfusion exam EXAM: BILATERAL LOWER EXTREMITY VENOUS DOPPLER ULTRASOUND TECHNIQUE: Gray-scale  sonography with compression, as well as color and duplex ultrasound, were performed to evaluate the deep venous system(s) from the level of the common femoral vein through the popliteal and proximal calf veins. COMPARISON:  None. FINDINGS: VENOUS Normal compressibility of the common femoral, superficial femoral, and popliteal veins, as well as the visualized calf veins. Visualized portions of profunda femoral vein and great saphenous vein unremarkable. No filling defects to suggest DVT on grayscale or color Doppler imaging. Doppler waveforms show normal direction of venous flow, normal respiratory plasticity and response to augmentation. OTHER None. Limitations: none IMPRESSION: No lower extremity DVT Electronically Signed   By:  Sharen Heck  Mir M.D.   On: 11/25/2020 07:21   DG Chest Portable 1 View  Result Date: 11/23/2020 CLINICAL DATA:  Weakness.  Positive COVID test today EXAM: PORTABLE CHEST 1 VIEW COMPARISON:  06/13/2018 FINDINGS: Mild patchy airspace disease in the lung bases appears more prominent than on prior study. No pleural effusion. Negative for heart failure. IMPRESSION: Mild bibasilar airspace disease which could represent COVID pneumonia. Electronically Signed   By: Franchot Gallo M.D.   On: 11/23/2020 20:13        Scheduled Meds: . vitamin C  1,000 mg Oral Daily  . calcium carbonate  1 tablet Oral Daily  . dorzolamide-timolol  1 drop Both Eyes BID  . famotidine  20 mg Oral BID  . guaiFENesin  600 mg Oral BID  . insulin aspart  0-9 Units Subcutaneous TID WC  . irbesartan  75 mg Oral Daily  . methylPREDNISolone (SOLU-MEDROL) injection  1 mg/kg Intravenous Q12H   Followed by  . [START ON 11/27/2020] predniSONE  50 mg Oral Daily  . omega-3 acid ethyl esters  2 g Oral Daily  . tiotropium  18 mcg Inhalation Daily  . zinc sulfate  220 mg Oral Daily   Continuous Infusions: . cefTRIAXone (ROCEPHIN)  IV Stopped (11/25/20 0019)  . heparin 800 Units/hr (11/25/20 1307)  . remdesivir 100 mg  in NS 100 mL Stopped (11/25/20 1046)     LOS: 2 days    Time spent: 35 minutes    Rex Magee A Osmin Welz, MD [facility]   If 7PM-7AM, please contact night-coverage [email or web address]  11/25/2020, 1:35 PM

## 2020-11-25 NOTE — Progress Notes (Signed)
[place] for heparin infusion Indication: pulmonary embolus  No Known Allergies  Patient Measurements: Height: 5\' 2"  (157.5 cm) Weight: 72.6 kg (160 lb) IBW/kg (Calculated) : 50.1 Heparin Dosing Weight: 65.6 kg  Vital Signs: Temp: 97.5 F (36.4 C) (06/03 0613) Temp Source: Oral (06/03 6144) BP: 143/89 (06/03 1028) Pulse Rate: 59 (06/03 1028)  Labs: Recent Labs    11/23/20 1923 11/24/20 0538 11/25/20 0009 11/25/20 0945  HGB 15.0 15.1* 15.0  --   HCT 45.1 45.5 46.1*  --   PLT 204 169 196  --   HEPARINUNFRC  --   --  >1.10* 0.94*  CREATININE 0.77 0.65 0.65  --     Estimated Creatinine Clearance: 47.1 mL/min (by C-G formula based on SCr of 0.65 mg/dL).   Medical History: Past Medical History:  Diagnosis Date  . Cyst    hx o on right breast  . DJD (degenerative joint disease)    right knee  . Glaucoma   . Hypertension   . Sleep apnea   . Thyroid disease     Medications:  Scheduled:  . vitamin C  1,000 mg Oral Daily  . calcium carbonate  1 tablet Oral Daily  . dorzolamide-timolol  1 drop Both Eyes BID  . famotidine  20 mg Oral BID  . guaiFENesin  600 mg Oral BID  . irbesartan  75 mg Oral Daily  . methylPREDNISolone (SOLU-MEDROL) injection  1 mg/kg Intravenous Q12H   Followed by  . [START ON 11/27/2020] predniSONE  50 mg Oral Daily  . omega-3 acid ethyl esters  2 g Oral Daily  . tiotropium  18 mcg Inhalation Daily  . zinc sulfate  220 mg Oral Daily    Assessment: 87 y.o.female with medical history significant for hypertension, sleep apnea and hypothyroidism as well as degenerative joint disease, presented to the ER with acute onset of generalized weakness. An NM pulmonary perfusion reveals high probability for pulmonary embolism. A review of her chart reveals no chronic anticoagulation prior to arrival. H&H, platelets wnl at baseline.  Goal of Therapy:  Heparin level 0.3-0.7 units/ml Monitor platelets by  anticoagulation protocol: Yes   6/03 0945 HL 0.94, supratherapeutic   Plan:  Reduce heparin infusion to 800 units/hr  Recheck HL in 8 hours Continue to monitor H&H and platelets  Geraldo Docker, PharmD Student  11/25/2020 10:31 AM

## 2020-11-25 NOTE — Care Management Important Message (Signed)
Important Message  Patient Details  Name: Lauren Morris MRN: [id number] Date of Birth: 11/30/1931   Medicare Important Message Given:  N/A - LOS <3 / Initial given by admissions  Initial Medicare IM reviewed with patient by Legrand Rams, Patient Access Associate on 11/24/2020 at 12:57am.      Dannette Barbara 11/25/2020, 8:24 AM

## 2020-11-26 LAB — CBC WITH DIFFERENTIAL/PLATELET
Abs Immature Granulocytes: 0.07 10*3/uL (ref 0.00–0.07)
Basophils Absolute: 0 10*3/uL (ref 0.0–0.1)
Basophils Relative: 0 %
Eosinophils Absolute: 0 10*3/uL (ref 0.0–0.5)
Eosinophils Relative: 0 %
HCT: 46.8 % — ABNORMAL HIGH (ref 36.0–46.0)
Hemoglobin: 15.4 g/dL — ABNORMAL HIGH (ref 12.0–15.0)
Immature Granulocytes: 1 %
Lymphocytes Relative: 21 %
Lymphs Abs: 2.5 10*3/uL (ref 0.7–4.0)
MCH: 29.3 pg (ref 26.0–34.0)
MCHC: 32.9 g/dL (ref 30.0–36.0)
MCV: 89.1 fL (ref 80.0–100.0)
Monocytes Absolute: 0.6 10*3/uL (ref 0.1–1.0)
Monocytes Relative: 5 %
Neutro Abs: 8.7 10*3/uL — ABNORMAL HIGH (ref 1.7–7.7)
Neutrophils Relative %: 73 %
Platelets: 208 10*3/uL (ref 150–400)
RBC: 5.25 MIL/uL — ABNORMAL HIGH (ref 3.87–5.11)
RDW: 13.3 % (ref 11.5–15.5)
WBC: 11.9 10*3/uL — ABNORMAL HIGH (ref 4.0–10.5)
nRBC: 0 % (ref 0.0–0.2)

## 2020-11-26 LAB — FERRITIN: Ferritin: 407 ng/mL — ABNORMAL HIGH (ref 11–307)

## 2020-11-26 LAB — COMPREHENSIVE METABOLIC PANEL
ALT: 55 U/L — ABNORMAL HIGH (ref 0–44)
AST: 48 U/L — ABNORMAL HIGH (ref 15–41)
Albumin: 3.2 g/dL — ABNORMAL LOW (ref 3.5–5.0)
Alkaline Phosphatase: 276 U/L — ABNORMAL HIGH (ref 38–126)
Anion gap: 8 (ref 5–15)
BUN: 27 mg/dL — ABNORMAL HIGH (ref 8–23)
CO2: 22 mmol/L (ref 22–32)
Calcium: 8.7 mg/dL — ABNORMAL LOW (ref 8.9–10.3)
Chloride: 107 mmol/L (ref 98–111)
Creatinine, Ser: 0.65 mg/dL (ref 0.44–1.00)
GFR, Estimated: >60 mL/min (ref >60)
Glucose, Bld: 206 mg/dL — ABNORMAL HIGH (ref 70–99)
Potassium: 3.9 mmol/L (ref 3.5–5.1)
Sodium: 137 mmol/L (ref 135–145)
Total Bilirubin: 0.6 mg/dL (ref 0.3–1.2)
Total Protein: 7.2 g/dL (ref 6.5–8.1)

## 2020-11-26 LAB — PROCALCITONIN: Procalcitonin: <0.1 ng/mL

## 2020-11-26 LAB — GLUCOSE, CAPILLARY
Glucose-Capillary: 181 mg/dL — ABNORMAL HIGH (ref 70–99)
Glucose-Capillary: 209 mg/dL — ABNORMAL HIGH (ref 70–99)
Glucose-Capillary: 177 mg/dL — ABNORMAL HIGH (ref 70–99)
Glucose-Capillary: 236 mg/dL — ABNORMAL HIGH (ref 70–99)

## 2020-11-26 LAB — D-DIMER, QUANTITATIVE: D-Dimer, Quant: 1.92 ug/mL-FEU — ABNORMAL HIGH (ref 0.00–0.50)

## 2020-11-26 LAB — C-REACTIVE PROTEIN: CRP: 0.8 mg/dL (ref <1.0)

## 2020-11-26 LAB — HEPARIN LEVEL (UNFRACTIONATED): Heparin Unfractionated: 0.55 IU/mL (ref 0.30–0.70)

## 2020-11-26 MED ORDER — GUAIFENESIN ER 600 MG PO TB12: 600.0000 mg | ORAL_TABLET | Freq: Two times a day (BID) | ORAL | 0 refills | Status: DC

## 2020-11-26 MED ORDER — APIXABAN 5 MG PO TABS: 10.0000 mg | ORAL_TABLET | Freq: Two times a day (BID) | ORAL | 0 refills | Status: DC

## 2020-11-26 MED ORDER — ADULT MULTIVITAMIN W/MINERALS CH: 1.0000 | ORAL_TABLET | Freq: Every day | ORAL | 0 refills | Status: AC

## 2020-11-26 MED ORDER — ZINC SULFATE 220 (50 ZN) MG PO CAPS: 220.0000 mg | ORAL_CAPSULE | Freq: Every day | ORAL | 0 refills | Status: DC

## 2020-11-26 MED ORDER — APIXABAN 5 MG PO TABS: 5.0000 mg | ORAL_TABLET | Freq: Two times a day (BID) | ORAL | 4 refills | Status: DC

## 2020-11-26 MED ORDER — APIXABAN 5 MG PO TABS: 5.0000 mg | ORAL_TABLET | Freq: Two times a day (BID) | ORAL | Status: DC

## 2020-11-26 MED ORDER — FAMOTIDINE 20 MG PO TABS: 20.0000 mg | ORAL_TABLET | Freq: Two times a day (BID) | ORAL | 0 refills | Status: DC

## 2020-11-26 MED ORDER — PREDNISONE 20 MG PO TABS: 40.0000 mg | ORAL_TABLET | Freq: Every day | ORAL | 0 refills | Status: AC

## 2020-11-26 MED ORDER — APIXABAN 5 MG PO TABS
10.0000 mg | ORAL_TABLET | Freq: Two times a day (BID) | ORAL | Status: DC
  Administered 2020-11-26 – 2020-11-27 (×3): 10 mg via ORAL
  Filled 2020-11-26 (×3): qty 2

## 2020-11-26 MED ORDER — GUAIFENESIN-DM 100-10 MG/5ML PO SYRP: 10.0000 mL | ORAL_SOLUTION | ORAL | 0 refills | Status: DC | PRN

## 2020-11-26 NOTE — Progress Notes (Signed)
ANTICOAGULATION CONSULT NOTE  Pharmacy Consult for heparin infusion Indication: pulmonary embolus  No Known Allergies  Patient Measurements: Height: 5\' 2"  (157.5 cm) Weight: 72.6 kg (160 lb) IBW/kg (Calculated) : 50.1 Heparin Dosing Weight: 65.6 kg  Vital Signs: Temp: 98 F (36.7 C) (06/03 2019) Temp Source: Oral (06/03 2019) BP: 157/67 (06/03 2019) Pulse Rate: 58 (06/03 2019)  Labs: Recent Labs    11/24/20 0538 11/24/20 0538 11/25/20 0009 11/25/20 0945 11/25/20 1757 11/26/20 0255  HGB 15.1*  --  15.0  --   --  15.4*  HCT 45.5  --  46.1*  --   --  46.8*  PLT 169  --  196  --   --  208  HEPARINUNFRC  --    < > >1.10* 0.94* 0.81* 0.55  CREATININE 0.65  --  0.65  --   --  0.65   < > = values in this interval not displayed.    Estimated Creatinine Clearance: 47.1 mL/min (by C-G formula based on SCr of 0.65 mg/dL).   Medical History: Past Medical History:  Diagnosis Date  . Cyst    hx o on right breast  . DJD (degenerative joint disease)    right knee  . Glaucoma   . Hypertension   . Sleep apnea   . Thyroid disease     Medications:  Scheduled:  . vitamin C  1,000 mg Oral Daily  . calcium carbonate  1 tablet Oral Daily  . dorzolamide-timolol  1 drop Both Eyes BID  . famotidine  20 mg Oral BID  . guaiFENesin  600 mg Oral BID  . insulin aspart  0-9 Units Subcutaneous TID WC  . irbesartan  75 mg Oral Daily  . methylPREDNISolone (SOLU-MEDROL) injection  1 mg/kg Intravenous Q12H   Followed by  . [START ON 11/27/2020] predniSONE  50 mg Oral Daily  . multivitamin with minerals  1 tablet Oral Daily  . omega-3 acid ethyl esters  2 g Oral Daily  . tiotropium  18 mcg Inhalation Daily  . zinc sulfate  220 mg Oral Daily    Assessment: 87 y.o.female with medical history significant for hypertension, sleep apnea and hypothyroidism as well as degenerative joint disease, presented to the ER with acute onset of generalized weakness. An NM pulmonary perfusion reveals high  probability for pulmonary embolism. A review of her chart reveals no chronic anticoagulation prior to arrival. H&H, platelets wnl at baseline.  Goal of Therapy:  Heparin level 0.3-0.7 units/ml Monitor platelets by anticoagulation protocol: Yes   6/03 0009 HL > 1.1, supratherapeutic, held, decreased to 900 units/hr 6/03 0945 HL 0.94, supratherapeutic decreased to 800 units/hr  6/03 1757 HL 0.81, supratherapeutic 6/04 0255 HL 0.55, therapeutic x 1  Plan:  Continue heparin infusion at 650 units/hr  Recheck HL in 8 hours to confirm then daily Continue to monitor H&H and platelets  Renda Rolls, PharmD, [facility] 11/26/2020 4:42 AM

## 2020-11-26 NOTE — Progress Notes (Signed)
[place] for apixaban Indication: pulmonary embolus  No Known Allergies  Patient Measurements: Height: 5\' 2"  (157.5 cm) Weight: 72.6 kg (160 lb) IBW/kg (Calculated) : 50.1 Heparin Dosing Weight: 65.6 kg  Vital Signs: Temp: 98.1 F (36.7 C) (06/04 0534) Temp Source: Oral (06/04 0534) BP: 144/68 (06/04 0534) Pulse Rate: 52 (06/04 0534)  Labs: Recent Labs    11/24/20 0538 11/24/20 0538 11/25/20 0009 11/25/20 0945 11/25/20 1757 11/26/20 0255  HGB 15.1*  --  15.0  --   --  15.4*  HCT 45.5  --  46.1*  --   --  46.8*  PLT 169  --  196  --   --  208  HEPARINUNFRC  --    < > >1.10* 0.94* 0.81* 0.55  CREATININE 0.65  --  0.65  --   --  0.65   < > = values in this interval not displayed.    Estimated Creatinine Clearance: 47.1 mL/min (by C-G formula based on SCr of 0.65 mg/dL).   Medical History: Past Medical History:  Diagnosis Date  . Cyst    hx o on right breast  . DJD (degenerative joint disease)    right knee  . Glaucoma   . Hypertension   . Sleep apnea   . Thyroid disease     Medications:  Scheduled:  . apixaban  10 mg Oral BID   Followed by  . [START ON 12/02/2020] apixaban  5 mg Oral BID  . vitamin C  1,000 mg Oral Daily  . calcium carbonate  1 tablet Oral Daily  . dorzolamide-timolol  1 drop Both Eyes BID  . famotidine  20 mg Oral BID  . guaiFENesin  600 mg Oral BID  . insulin aspart  0-9 Units Subcutaneous TID WC  . irbesartan  75 mg Oral Daily  . methylPREDNISolone (SOLU-MEDROL) injection  1 mg/kg Intravenous Q12H   Followed by  . [START ON 11/27/2020] predniSONE  50 mg Oral Daily  . multivitamin with minerals  1 tablet Oral Daily  . omega-3 acid ethyl esters  2 g Oral Daily  . tiotropium  18 mcg Inhalation Daily  . zinc sulfate  220 mg Oral Daily    Assessment: 87 y.o.female with medical history significant for hypertension, sleep apnea and hypothyroidism as well as degenerative joint disease, presented to  the ER with acute onset of generalized weakness. An NM pulmonary perfusion reveals high probability for pulmonary embolism. A review of her chart reveals no chronic anticoagulation prior to arrival. Pharmacy has been consulted to transition from heparin gtt to apixaban for PE.   Hgb 15.4, plt 208  Goal of Therapy:  Heparin level 0.3-0.7 units/ml Monitor platelets by anticoagulation protocol: Yes   Plan:  Discontinue heparin infusion 6/4 at 1145 and begin apixban 10 mg twice daily x 6 more days, followed by 5 mg twice daily Continue to monitor CBC with AM labs  Benn Moulder, PharmD Pharmacy Resident  11/26/2020 11:00 AM

## 2020-11-26 NOTE — Evaluation (Signed)
Physical Therapy Evaluation Patient Details Name: Lauren Morris MRN: [id number] DOB: 1931-10-28 Today's Date: 11/26/2020   History of Present Illness  Pt is an 87 y/o F with PMH: HTN, sleep apnea, and hypothyroidism, who presented to [facility] ED with c/o generalized weakness. She reported being exposed to [place] from her niece. Pt found to be positive for COVID-19.  Clinical Impression  Pt is a pleasant 87 year old female who was admitted for ARF with covid. Per chart started on heparin on 6/2 due to high probability for PE. Cleared to work with PT. Pt performs bed mobility with min assist, transfers with cga, and ambulation with cga and RW. Educated on need for RW vs SPC at this time due to deficits. Per discussion with family, she will have 24/7 at home to assist in recovery. No SOB symptoms with ambulation, although does report fatigue. Pt demonstrates deficits with strength/mobility/endurance. Would benefit from skilled PT to address above deficits and promote optimal return to PLOF. Recommend transition to [place] upon discharge from acute hospitalization.     Follow Up Recommendations Home health PT;Supervision for mobility/OOB    Equipment Recommendations  None recommended by PT    Recommendations for Other Services       Precautions / Restrictions Precautions Precautions: Fall Restrictions Weight Bearing Restrictions: No      Mobility  Bed Mobility Overal bed mobility: Needs Assistance Bed Mobility: Supine to Sit     Supine to sit: Min assist     General bed mobility comments: needed assist with B LE and scooting out towards EOB.    Transfers Overall transfer level: Needs assistance Equipment used: Rolling walker (2 wheeled) Transfers: Sit to/from [employer] Sit to Stand: Min guard Stand pivot transfers: Min guard;Min assist       General transfer comment: cues for hand placement. Once standing, upright posture noted  Ambulation/Gait Ambulation/Gait  assistance: Min guard Gait Distance (Feet): 40 Feet Assistive device: Rolling walker (2 wheeled) Gait Pattern/deviations: Step-through pattern     General Gait Details: slow gait speed with reciprocal gait pattern. RW used, however needs cues for staying close to RW during turns.  Stairs            Wheelchair Mobility    Modified Rankin (Stroke Patients Only)       Balance Overall balance assessment: Needs assistance Sitting-balance support: Feet supported Sitting balance-Leahy Scale: Good     Standing balance support: During functional activity;Bilateral upper extremity supported;Single extremity supported Standing balance-Leahy Scale: Fair Standing balance comment: benefits from at least unilateral support. Endorses limited standing tolerance.                             Pertinent Vitals/Pain Pain Assessment: No/denies pain    Home Living Family/patient expects to be discharged to:: Private residence Living Arrangements: Alone Available Help at Discharge: Family;Available PRN/intermittently Type of Home: House Home Access: Stairs to enter Entrance Stairs-Rails: Right Entrance Stairs-Number of Steps: 3 Home Layout: One level Home Equipment: Walker - 2 wheels;Cane - single point Additional Comments: reports no recent fall history. Per discussion with family, they will provide 24/7 assist at discharge    Prior Function Level of Independence: Independent with assistive device(s)         Comments: uses SPC PRN. Drives and indep with mobility     Hand Dominance        Extremity/Trunk Assessment   Upper Extremity Assessment Upper Extremity Assessment:  Generalized weakness (B UE grossly 4/5)    Lower Extremity Assessment Lower Extremity Assessment: Generalized weakness (B LE grossly 4/5)       Communication   Communication: No difficulties  Cognition Arousal/Alertness: Awake/alert Behavior During Therapy: WFL for tasks  assessed/performed Overall Cognitive Status: Within Functional Limits for tasks assessed                                 General Comments: some delayed responses and some instances of perseverations, potentially somewhat HOH? Overall appropriate. Follows all commands.      General Comments      Exercises Other Exercises Other Exercises: OT ed re: role of OT. Engages pt in seated bathing/dressing UB with SETUP, MIN/MOD A for LB bathing, MOD A to don socks in sitting. Other Exercises: supine/seated ther-ex on B LE including LAQ, SLRs, AP, and hip abd/add. 10 reps with cga   Assessment/Plan    PT Assessment Patient needs continued PT services  PT Problem List Decreased strength;Decreased activity tolerance;Decreased balance;Decreased mobility       PT Treatment Interventions Gait training;Therapeutic exercise;Balance training    PT Goals (Current goals can be found in the Care Plan section)  Acute Rehab PT Goals Patient Stated Goal: to get stronger and be able to go home PT Goal Formulation: With patient Time For Goal Achievement: 12/10/20 Potential to Achieve Goals: Good    Frequency Min 2X/week   Barriers to discharge        Co-evaluation               AM-PAC PT "6 Clicks" Mobility  Outcome Measure Help needed turning from your back to your side while in a flat bed without using bedrails?: A Little Help needed moving from lying on your back to sitting on the side of a flat bed without using bedrails?: A Little Help needed moving to and from a bed to a chair (including a wheelchair)?: A Little Help needed standing up from a chair using your arms (e.g., wheelchair or bedside chair)?: A Little Help needed to walk in hospital room?: A Little Help needed climbing 3-5 steps with a railing? : A Lot 6 Click Score: 17    End of Session Equipment Utilized During Treatment: Gait belt Activity Tolerance: Patient tolerated treatment well Patient left: in  bed;with bed alarm set;with family/visitor present (pt declined to sit at EOB or recliner chair this date) Nurse Communication: Mobility status PT Visit Diagnosis: Unsteadiness on feet (R26.81);Muscle weakness (generalized) (M62.81)    Time: 8144-8185 PT Time Calculation (min) (ACUTE ONLY): 42 min   Charges:   PT Evaluation $PT Eval Moderate Complexity: 1 Mod PT Treatments $Gait Training: 8-22 mins $Therapeutic Exercise: 8-22 mins        Greggory Stallion, PT, DPT [phone]   Lea Walbert 11/26/2020, 2:32 PM

## 2020-11-26 NOTE — Evaluation (Addendum)
Occupational Therapy Evaluation Patient Details Name: Lauren Morris MRN: [id number] DOB: 01/08/1932 Today's Date: 11/26/2020    History of Present Illness Pt is an 87 y/o F with PMH: HTN, sleep apnea, and hypothyroidism, who presented to [facility] ED with c/o generalized weakness. She reported being exposed to [place] from her niece. Pt found to be positive for COVID-19.   Clinical Impression   Pt seen for OT evaluation this date in setting of acute hospitalization d/t COVID-19. Pt reports being INDEP with all self care at baseline and MOD I intermittently with SPC of walker for fxl mobility. Pt presents this date with decreased fxl activity tolerance, balance, strength and safety awareness impacting her ability to safely and efficiently perform ADLs/ADL mobility. On ADL assessment, pt requires SETUP to MIN A for seated UB ADLs including UB bathing and donning clean gown, MOD/MAX A for seated LB ADLs such as donning socks and standing posterior peri care after small BM. CGA to MIN A for ADL transfers with RW. In addition, she requires occasional cues re: safe use of 2WW including hand placement cues. Pt left sitting EOB at end of session with call button and phone in reach. All needs met. Will continue to follow acutely. Pt with good family support. Anticipate that pt will require supervision from family members as well as [place] f/u upon d/c from acute setting as she is currently requiring increased assistance for safety with self care and mobility.    Follow Up Recommendations  Home health OT;Supervision/Assistance - 24 hour    Equipment Recommendations  Tub/shower seat;3 in 1 bedside commode    Recommendations for Other Services       Precautions / Restrictions Precautions Precautions: Fall Restrictions Weight Bearing Restrictions: No      Mobility Bed Mobility Overal bed mobility: Modified Independent                  Transfers Overall transfer level: Needs assistance Equipment  used: Rolling walker (2 wheeled) Transfers: Sit to/from [employer] Transfers Sit to Stand: Min guard Stand pivot transfers: Min guard;Min assist       General transfer comment: increased time, cues for hand placement with RW    Balance Overall balance assessment: Needs assistance Sitting-balance support: Feet supported Sitting balance-Leahy Scale: Good     Standing balance support: During functional activity;Bilateral upper extremity supported;Single extremity supported Standing balance-Leahy Scale: Fair Standing balance comment: benefits from at least unilateral support. Endorses limited standing tolerance.                           ADL either performed or assessed with clinical judgement   ADL Overall ADL's : Needs assistance/impaired                                       General ADL Comments: requires SETUP to MIN A for seated UB ADLs, MOD/MAX A for seated LB ADLs such as donning socks. CGA to MIN A for ADL transfers with RW.     Vision Patient Visual Report: No change from baseline       Perception     Praxis      Pertinent Vitals/Pain Pain Assessment: No/denies pain     Hand Dominance     Extremity/Trunk Assessment Upper Extremity Assessment Upper Extremity Assessment: Overall WFL for tasks assessed;Generalized weakness (ROM WFL, MMT grossly 4-/5)  Lower Extremity Assessment Lower Extremity Assessment: Defer to PT evaluation;Overall [facility] for tasks assessed;Generalized weakness       Communication Communication Communication: No difficulties   Cognition Arousal/Alertness: Awake/alert Behavior During Therapy: WFL for tasks assessed/performed Overall Cognitive Status: Within Functional Limits for tasks assessed                                 General Comments: some delayed responses and some instances of perseverations, potentially somewhat HOH? Overall appropriate. Follows all commands.   General  Comments       Exercises Other Exercises Other Exercises: OT ed re: role of OT. Engages pt in seated bathing/dressing UB with SETUP, MIN/MOD A for LB bathing, MOD A to don socks in sitting.   Shoulder Instructions      Home Living Family/patient expects to be discharged to:: Private residence Living Arrangements: Alone Available Help at Discharge: Family;Available PRN/intermittently Type of Home: House Home Access: Stairs to enter [employer] of Steps: 3 Entrance Stairs-Rails: Right Home Layout: One level               Home Equipment: Walker - 2 wheels;Cane - single point   Additional Comments: reports no recent fall history      Prior Functioning/Environment Level of Independence: Independent        Comments: INDEP with all self care, occasional use of AD for fxl mobility        OT Problem List: Decreased strength;Impaired balance (sitting and/or standing);Decreased activity tolerance;Decreased knowledge of use of DME or AE;Cardiopulmonary status limiting activity      OT Treatment/Interventions: Self-care/ADL training;DME and/or AE instruction;Therapeutic activities;Balance training;Therapeutic exercise;Energy conservation;Patient/family education    OT Goals(Current goals can be found in the care plan section) Acute Rehab OT Goals Patient Stated Goal: to get stronger and be able to go home OT Goal Formulation: With patient Time For Goal Achievement: 12/10/20 Potential to Achieve Goals: Good ADL Goals Pt Will Perform Upper Body Dressing: Independently;sitting Pt Will Perform Lower Body Dressing: with modified independence;sitting/lateral leans Pt Will Transfer to Toilet: with supervision;ambulating;bedside commode Pt Will Perform Toileting - Clothing Manipulation and hygiene: with supervision;sit to/from stand  OT Frequency: Min 1X/week   Barriers to D/C:            Co-evaluation              AM-PAC OT "6 Clicks" Daily Activity      Outcome Measure Help from another person eating meals?: None Help from another person taking care of personal grooming?: A Little Help from another person toileting, which includes using toliet, bedpan, or urinal?: A Little Help from another person bathing (including washing, rinsing, drying)?: A Lot Help from another person to put on and taking off regular upper body clothing?: A Little Help from another person to put on and taking off regular lower body clothing?: A Lot 6 Click Score: 17   End of Session Equipment Utilized During Treatment: Gait belt;Rolling walker  Activity Tolerance: Patient tolerated treatment well Patient left: with call bell/phone within reach;Other (comment) (seated EOB)  OT Visit Diagnosis: Unsteadiness on feet (R26.81);Muscle weakness (generalized) (M62.81)                Time: 7096-2836 OT Time Calculation (min): 30 min Charges:  OT General Charges $OT Visit: 1 Visit OT Evaluation $OT Eval Moderate Complexity: 1 Mod OT Treatments $Self Care/Home Management : 8-22 mins  Gerrianne Scale, MS,  OTR/L ascom [phone] 11/26/20, 2:24 PM

## 2020-11-26 NOTE — Progress Notes (Signed)
PROGRESS NOTE    Lauren Morris  PYP:[id number] DOB: 11-28-1931 DOA: 11/23/2020 PCP: [name], MD   Brief Narrative: 87 year old with past medical history significant for hypertension, sleep apnea, hypothyroidism, who presents to the ED complaining of generalized weakness.  She reported being exposed to [place] from her niece.  Her COVID test came back positive.  She report fevers, occasional clear sputum, occasional dyspnea and wheezing.  She has had 2 Pfizer vaccine and 1 booster.  Evaluation in the ED: Blood pressure elevated 179/66 oxygen 90 on room air was placed on 2 L of oxygen.  Respiration rate 27.  T-max 102.  Transaminases.  COVID PCR positive.  UA with many white blood cells and positive nitrate. Chest x-ray showed mild bibasilar airspace disease that could represent COVID-pneumonia.  Patient admitted for COVID-pneumonia and acute hypoxic respiratory failure.  Assessment & Plan:   Active Problems:   Acute hypoxemic respiratory failure due to COVID-19 (HCC)   Obesity, diabetes, and hypertension syndrome (HCC)  1-Acute Hypoxic Respiratory failure secondary to COVID-19 pneumonia: -Continue with oxygen supplementation -Continue with IV remdesivir day 3 and IVF steroids. -Procalcitonin not elevated -Albuterol as needed -Continue with vitamin supplements. -Monitor inflammatory markers, continue to decreases.   COVID-19 Labs  Recent Labs    11/24/20 0538 11/25/20 0009 11/26/20 0255  DDIMER 2.49* 2.40* 1.92*  FERRITIN 253 330* 407*  CRP 1.0* 0.8 0.8    Lab Results  Component Value Date   SARSCOV2NAA POSITIVE (A) 11/23/2020   [place] Not Detected 12/25/2019   2-AKI correction, rule out    3-UTI; Continue with ceftriaxone. Day 3/3  HTN; Resume  micardis and hold  BB.   Bradycardia; Specially when she sleep. Plan to hold [place].  Asymptomatic.   PE;  Positive D dimer;  VQ scan; high probability for PE.  Treated with Heparin Gtt. Plan to start Eliquis  and observe on medications.  Doppler LE negative.   Transaminases;  Chronic. Presume related to methimazole. Patient will need to follow up with Dr [name].  GTT elevated.  Trending down.  Per Dr Vanita Ingles notes:  [place] without evidence of cholestasis or cirrhosis.   Hyperglycemia; related to steroids.  SSI    Estimated body mass index is 29.26 kg/m as calculated from the following:   Height as of this encounter: 5\' 2"  (1.575 m).   Weight as of this encounter: 72.6 kg.   DVT prophylaxis: Lovenox Code Status: DNR Family Communication: Daughter updated 6/04  Disposition Plan:  Status is: Inpatient  Remains inpatient appropriate because:IV treatments appropriate due to intensity of illness or inability to take PO   Dispo: The patient is from: Home              Anticipated d/c is to: Home              Patient currently is not medically stable to d/c.Home tomorrow.    Difficult to place patient No        Consultants:   None  Procedures:   VQ scan  Doppler.   Antimicrobials:    Subjective: No new complaints. She is breathing well.  We have long conversation about labs results, medications that she is taking, plan for anticoagulation. Explain to patient and daughter , patient does not need IVC filter. Is not indicated in her case.   Objective: Vitals:   11/25/20 1551 11/25/20 2019 11/26/20 0534 11/26/20 1211  BP: (!) 158/73 (!) 157/67 (!) 144/68 (!) 164/69  Pulse: (!) 53 Marland Kitchen)  58 (!) 52 (!) 50  Resp: 18 20 16 16   Temp: 98.8 F (37.1 C) 98 F (36.7 C) 98.1 F (36.7 C) 97.7 F (36.5 C)  TempSrc:  Oral Oral Oral  SpO2: 94% 93% 96% 96%  Weight:      Height:        Intake/Output Summary (Last 24 hours) at 11/26/2020 1415 Last data filed at 11/26/2020 1229 Gross per 24 hour  Intake --  Output 2050 ml  Net -2050 ml   Filed Weights   11/23/20 1926  Weight: 72.6 kg    Examination:  General exam: NAD Respiratory system: CTA Cardiovascular system: S 1, S 2  RRR Gastrointestinal system: BS present, soft, nt Central nervous system:  Non focal.  Extremities: Symmetric power   Data Reviewed: I have personally reviewed following labs and imaging studies  CBC: Recent Labs  Lab 11/23/20 1923 11/24/20 0538 11/25/20 0009 11/26/20 0255  WBC 5.7 4.0 5.4 11.9*  NEUTROABS 3.1 2.3 3.1 8.7*  HGB 15.0 15.1* 15.0 15.4*  HCT 45.1 45.5 46.1* 46.8*  MCV 88.4 90.8 90.0 89.1  PLT 204 169 196 161   Basic Metabolic Panel: Recent Labs  Lab 11/23/20 1923 11/24/20 0538 11/24/20 1012 11/25/20 0009 11/26/20 0255  NA 133* 135  --  137 137  K 4.1 3.9  --  4.1 3.9  CL 101 104  --  107 107  CO2 21* 20*  --  21* 22  GLUCOSE 141* 209*  --  239* 206*  BUN 20 18  --  20 27*  CREATININE 0.77 0.65  --  0.65 0.65  CALCIUM 9.0 8.0*  --  8.1* 8.7*  MG  --   --  2.1  --   --    GFR: Estimated Creatinine Clearance: 47.1 mL/min (by C-G formula based on SCr of 0.65 mg/dL). Liver Function Tests: Recent Labs  Lab 11/23/20 1923 11/24/20 0538 11/25/20 0009 11/26/20 0255  AST 115* 87* 57* 48*  ALT 84* 75* 60* 55*  ALKPHOS 331* 319* 307* 276*  BILITOT 0.8 0.8 0.7 0.6  PROT 7.7 7.4 7.3 7.2  ALBUMIN 3.5 3.2* 3.1* 3.2*   No results for input(s): LIPASE, AMYLASE in the last 168 hours. No results for input(s): AMMONIA in the last 168 hours. Coagulation Profile: No results for input(s): INR, PROTIME in the last 168 hours. Cardiac Enzymes: No results for input(s): CKTOTAL, CKMB, CKMBINDEX, TROPONINI in the last 168 hours. BNP (last 3 results) No results for input(s): PROBNP in the last 8760 hours. HbA1C: No results for input(s): HGBA1C in the last 72 hours. CBG: Recent Labs  Lab 11/25/20 1646 11/25/20 2020 11/26/20 0809 11/26/20 1206  GLUCAP 165* 226* 181* 209*   Lipid Profile: No results for input(s): CHOL, HDL, LDLCALC, TRIG, CHOLHDL, LDLDIRECT in the last 72 hours. Thyroid Function Tests: No results for input(s): TSH, T4TOTAL, FREET4, T3FREE,  THYROIDAB in the last 72 hours. Anemia Panel: Recent Labs    11/25/20 0009 11/26/20 0255  FERRITIN 330* 407*   Sepsis Labs: Recent Labs  Lab 11/23/20 1923 11/24/20 1012 11/25/20 0009 11/26/20 0255  PROCALCITON <0.10 <0.10 <0.10 <0.10    Recent Results (from the past 240 hour(s))  Urine Culture     Status: Abnormal   Collection Time: 11/23/20  7:37 PM   Specimen: Urine, Clean Catch  Result Value Ref Range Status   Specimen Description   Final    URINE, CLEAN CATCH Performed at [facility], [place],  [place] [address]    Special Requests   Final    NONE Performed at [facility], [address] [place], [place] [address]    Culture MULTIPLE SPECIES PRESENT, SUGGEST RECOLLECTION (A)  Final   Report Status 11/25/2020 FINAL  Final  Resp Panel by RT-PCR (Flu A&B, Covid) Nasopharyngeal Swab     Status: Abnormal   Collection Time: 11/23/20  9:15 PM   Specimen: Nasopharyngeal Swab; Nasopharyngeal(NP) swabs in vial transport medium  Result Value Ref Range Status   SARS Coronavirus 2 by RT PCR POSITIVE (A) NEGATIVE Final    Comment: RESULT CALLED TO, READ BACK BY AND VERIFIED WITH: YARELI [email or web address]@[email or web address]  11/23/20 RH (NOTE) SARS-CoV-2 target nucleic acids are DETECTED.  The SARS-CoV-2 RNA is generally detectable in upper respiratory specimens during the acute phase of infection. Positive results are indicative of the presence of the identified virus, but do not rule out bacterial infection or co-infection with other pathogens not detected by the test. Clinical correlation with patient history and other diagnostic information is necessary to determine patient infection status. The expected result is Negative.  Fact Sheet for Patients: EntrepreneurPulse.com.au  Fact Sheet for Healthcare Providers: IncredibleEmployment.be  This test is not yet approved or cleared by the [place] FDA and  has been  authorized for detection and/or diagnosis of SARS-CoV-2 by FDA under an Emergency Use Authorization (EUA).  This EUA will remain in effect (meaning this test can be used)  for the duration of  the COVID-19 declaration under Section 564(b)(1) of the Act, 21 U.S.C. section 360bbb-3(b)(1), unless the authorization is terminated or revoked sooner.     Influenza A by PCR NEGATIVE NEGATIVE Final   Influenza B by PCR NEGATIVE NEGATIVE Final    Comment: (NOTE) The Xpert Xpress SARS-CoV-2/FLU/RSV plus assay is intended as an aid in the diagnosis of influenza from Nasopharyngeal swab specimens and should not be used as a sole basis for treatment. Nasal washings and aspirates are unacceptable for Xpert Xpress SARS-CoV-2/FLU/RSV testing.  Fact Sheet for Patients: EntrepreneurPulse.com.au  Fact Sheet for Healthcare Providers: IncredibleEmployment.be  This test is not yet approved or cleared by the [place] FDA and has been authorized for detection and/or diagnosis of SARS-CoV-2 by FDA under an Emergency Use Authorization (EUA). This EUA will remain in effect (meaning this test can be used) for the duration of the COVID-19 declaration under Section 564(b)(1) of the Act, 21 U.S.C. section 360bbb-3(b)(1), unless the authorization is terminated or revoked.  Performed at [facility], [address], [place], [place] [address]          Radiology Studies: US Venous Img Lower Bilateral (DVT)  Result Date: 11/25/2020 CLINICAL DATA:  Positive D-dimer Positive nuclear medicine pulmonary perfusion exam EXAM: BILATERAL LOWER EXTREMITY VENOUS DOPPLER ULTRASOUND TECHNIQUE: Gray-scale sonography with compression, as well as color and duplex ultrasound, were performed to evaluate the deep venous system(s) from the level of the common femoral vein through the popliteal and proximal calf veins. COMPARISON:  None. FINDINGS: VENOUS Normal compressibility of  the common femoral, superficial femoral, and popliteal veins, as well as the visualized calf veins. Visualized portions of profunda femoral vein and great saphenous vein unremarkable. No filling defects to suggest DVT on grayscale or color Doppler imaging. Doppler waveforms show normal direction of venous flow, normal respiratory plasticity and response to augmentation. OTHER None. Limitations: none IMPRESSION: No lower extremity DVT Electronically Signed   By: Miachel Roux M.D.   On: 11/25/2020 07:21  Scheduled Meds: . apixaban  10 mg Oral BID   Followed by  . [START ON 12/02/2020] apixaban  5 mg Oral BID  . vitamin C  1,000 mg Oral Daily  . calcium carbonate  1 tablet Oral Daily  . dorzolamide-timolol  1 drop Both Eyes BID  . famotidine  20 mg Oral BID  . guaiFENesin  600 mg Oral BID  . insulin aspart  0-9 Units Subcutaneous TID WC  . irbesartan  75 mg Oral Daily  . methylPREDNISolone (SOLU-MEDROL) injection  1 mg/kg Intravenous Q12H   Followed by  . [START ON 11/27/2020] predniSONE  50 mg Oral Daily  . multivitamin with minerals  1 tablet Oral Daily  . omega-3 acid ethyl esters  2 g Oral Daily  . tiotropium  18 mcg Inhalation Daily  . zinc sulfate  220 mg Oral Daily   Continuous Infusions: . cefTRIAXone (ROCEPHIN)  IV 1 g (11/25/20 2108)  . remdesivir 100 mg in NS 100 mL 100 mg (11/26/20 0906)     LOS: 3 days    Time spent: 35 minutes    Lyan Moyano A Ebelin Dillehay, MD [facility]   If 7PM-7AM, please contact night-coverage [email or web address]  11/26/2020, 2:15 PM

## 2020-11-26 NOTE — Progress Notes (Signed)
Attempted call into room to discuss [facility] recommendations from PT. No answer. Asked RN to have patient call CSW back when able.  Oleh Genin, [place]

## 2020-11-27 LAB — BASIC METABOLIC PANEL
Anion gap: 11 (ref 5–15)
BUN: 30 mg/dL — ABNORMAL HIGH (ref 8–23)
CO2: 23 mmol/L (ref 22–32)
Calcium: 9.1 mg/dL (ref 8.9–10.3)
Chloride: 104 mmol/L (ref 98–111)
Creatinine, Ser: 0.73 mg/dL (ref 0.44–1.00)
GFR, Estimated: >60 mL/min (ref >60)
Glucose, Bld: 151 mg/dL — ABNORMAL HIGH (ref 70–99)
Potassium: 4.4 mmol/L (ref 3.5–5.1)
Sodium: 138 mmol/L (ref 135–145)

## 2020-11-27 LAB — GLUCOSE, CAPILLARY: Glucose-Capillary: 150 mg/dL — ABNORMAL HIGH (ref 70–99)

## 2020-11-27 MED ORDER — HYDRALAZINE HCL 25 MG PO TABS: 50.0000 mg | ORAL_TABLET | Freq: Two times a day (BID) | ORAL | 1 refills | Status: DC

## 2020-11-27 MED ORDER — GLIPIZIDE 5 MG PO TABS: 2.5000 mg | ORAL_TABLET | Freq: Every day | ORAL | 0 refills | Status: DC

## 2020-11-27 MED ORDER — HYDRALAZINE HCL 25 MG PO TABS
25.0000 mg | ORAL_TABLET | Freq: Two times a day (BID) | ORAL | Status: DC
  Administered 2020-11-27: 25 mg via ORAL
  Filled 2020-11-27: qty 1

## 2020-11-27 MED ORDER — PREDNISONE 20 MG PO TABS
40.0000 mg | ORAL_TABLET | Freq: Every day | ORAL | Status: DC
  Administered 2020-11-27: 40 mg via ORAL
  Filled 2020-11-27: qty 2

## 2020-11-27 MED ORDER — HYDRALAZINE HCL 25 MG PO TABS: 25.0000 mg | ORAL_TABLET | Freq: Two times a day (BID) | ORAL | 1 refills | Status: DC

## 2020-11-27 MED ORDER — HYDRALAZINE HCL 25 MG PO TABS
25.0000 mg | ORAL_TABLET | Freq: Once | ORAL | Status: AC
  Administered 2020-11-27: 25 mg via ORAL
  Filled 2020-11-27: qty 1

## 2020-11-27 NOTE — Discharge Summary (Signed)
Physician Discharge Summary  Lauren Morris IRJ:[id number] DOB: January 11, 1932 DOA: 11/23/2020  PCP: [name], MD  Admit date: 11/23/2020 Discharge date: 11/27/2020  Admitted From: Home  Disposition:  Home   Recommendations for Outpatient Follow-up:  1. Follow up with PCP in 1-2 weeks 2. Please obtain BMP/CBC in one week 3. Patient will need follow up for BP, atenolol was discontinue due to bradycardia during sleep.  4. Needs Follow up CBG, after completion of prednisone.  5. Follow up on resolution of Covid.  6. Patient discharge on Eliquis for new PE. She will need further refill.  7. Needs to follow up with [facility] for Chronic transaminases     Home Health: Yes.   Discharge Condition: Stable.  CODE STATUS: Full code Diet recommendation: Heart Healthy    Brief/Interim Summary: 87 year old with past medical history significant for hypertension, sleep apnea, hypothyroidism, who presents to the ED complaining of generalized weakness.  She reported being exposed to [place] from her niece.  Her COVID test came back positive.  She report fevers, occasional clear sputum, occasional dyspnea and wheezing.  She has had 2 Pfizer vaccine and 1 booster.  Evaluation in the ED: Blood pressure elevated 179/66 oxygen 90 on room air was placed on 2 L of oxygen.  Respiration rate 27.  T-max 102.  Transaminases.  COVID PCR positive.  UA with many white blood cells and positive nitrate. Chest x-ray showed mild bibasilar airspace disease that could represent COVID-pneumonia.  Patient admitted for COVID-pneumonia and acute hypoxic respiratory failure.  1-Acute Hypoxic Respiratory failure secondary to COVID-19 pneumonia: -Continue with oxygen supplementation. -Received IV remdesivir for  3 days.  and IVF steroids. Plan to discharge on 5 days of prednisone.  -Procalcitonin not elevated. -Albuterol as needed -Continue with vitamin supplements. -Monitor inflammatory markers, continue to decreases.  -Off  oxygen.   COVID-19 Labs  Recent Labs (last 2 labs)        Recent Labs    11/24/20 0538 11/25/20 0009 11/26/20 0255  DDIMER 2.49* 2.40* 1.92*  FERRITIN 253 330* 407*  CRP 1.0* 0.8 0.8      Recent Labs       Lab Results  Component Value Date   SARSCOV2NAA POSITIVE (A) 11/23/2020   [place] Not Detected 12/25/2019     2-AKI correction, rule out    3-UTI; Received  ceftriaxone. Day 3/3  HTN; Resume  micardis and hold  BB due to bradycardia.  Started Hydralazine; 50 Mg BID. Marland Kitchen   Bradycardia; Specially when she sleep. Plan to hold Tenomir.  Asymptomatic.   PE;  Positive D dimer;  VQ scan; High probability for PE. Multiples perfusion defect/.  Treated with Heparin Gtt. Started on  Eliquis. Plan to discharge on Eliquis.  Doppler LE negative.   Transaminases;  Chronic. Presume related to methimazole. Patient will need to follow up with Dr [name].  GTT elevated.  Trending down.  Per Dr Vanita Ingles notes:  [place] without evidence of cholestasis or cirrhosis.   Hyperglycemia; related to steroids.  SSI  Discharge on low dose glipizide while she is on prednisone.     Discharge Diagnoses:  Active Problems:   Acute hypoxemic respiratory failure due to COVID-19 (HCC)   Obesity, diabetes, and hypertension syndrome [facility])    Discharge Instructions  Discharge Instructions    Diet - low sodium heart healthy   Complete by: As directed    Increase activity slowly   Complete by: As directed      Allergies as of  11/27/2020   No Known Allergies     Medication List    STOP taking these medications   atenolol 25 MG tablet Commonly known as: TENORMIN     TAKE these medications   apixaban 5 MG Tabs tablet Commonly known as: ELIQUIS Take 2 tablets (10 mg total) by mouth 2 (two) times daily for 7 days.   apixaban 5 MG Tabs tablet Commonly known as: ELIQUIS Take 1 tablet (5 mg total) by mouth 2 (two) times daily. Start taking on: December 02, 2020   CALCIUM  1000 + D PO Take 1 tablet by mouth daily.   dorzolamide-timolol 22.3-6.8 MG/ML ophthalmic solution Commonly known as: COSOPT Place 1 drop into both eyes 2 (two) times daily.   famotidine 20 MG tablet Commonly known as: PEPCID Take 1 tablet (20 mg total) by mouth 2 (two) times daily.   fish oil-omega-3 fatty acids 1000 MG capsule Take 2 g by mouth daily.   furosemide 20 MG tablet Commonly known as: LASIX TAKE ONE TABLET BY MOUTH DAILY AS NEEDEDFOR FLUID RETENTION What changed:   how much to take  how to take this  when to take this  additional instructions   glipiZIDE 5 MG tablet Commonly known as: GLUCOTROL Take 0.5 tablets (2.5 mg total) by mouth daily before breakfast for 5 days.   guaiFENesin 600 MG 12 hr tablet Commonly known as: MUCINEX Take 1 tablet (600 mg total) by mouth 2 (two) times daily.   guaiFENesin-dextromethorphan 100-10 MG/5ML syrup Commonly known as: ROBITUSSIN DM Take 10 mLs by mouth every 4 (four) hours as needed for cough.   hydrALAZINE 25 MG tablet Commonly known as: APRESOLINE Take 2 tablets (50 mg total) by mouth 2 (two) times daily.   multivitamin with minerals Tabs tablet Take 1 tablet by mouth daily.   predniSONE 20 MG tablet Commonly known as: DELTASONE Take 2 tablets (40 mg total) by mouth daily for 5 days.   Spiriva HandiHaler 18 MCG inhalation capsule Generic drug: tiotropium Place 1 capsule (18 mcg total) into inhaler and inhale daily.   telmisartan 40 MG tablet Commonly known as: MICARDIS Take 1 tablet (40 mg total) by mouth at bedtime.   vitamin C 1000 MG tablet Take 1,000 mg by mouth daily.   zinc sulfate 220 (50 Zn) MG capsule Take 1 capsule (220 mg total) by mouth daily.            Durable Medical Equipment  (From admission, onward)         Start     Ordered   11/27/20 0956  For home use only DME Walker rolling  Once       Question Answer Comment  Walker: With 5 Inch Wheels   Patient needs a walker to  treat with the following condition Balance disorder      11/27/20 0955          Follow-up Information    [name], MD Follow up in 1 week(s).   Specialty: Internal Medicine Contact information: [place] [address] [phone]              No Known Allergies  Consultations:  None   Procedures/Studies: NM Pulmonary Perfusion  Result Date: 11/24/2020 CLINICAL DATA:  Shortness of breath.  COVID-19 positive EXAM: NUCLEAR MEDICINE PERFUSION LUNG SCAN TECHNIQUE: Perfusion images were obtained in multiple projections after intravenous injection of radiopharmaceutical. Ventilation scans intentionally deferred if perfusion scan and chest x-ray adequate for interpretation during  COVID 19 epidemic. RADIOPHARMACEUTICALS:  4.0 mCi Tc-70m MAA IV COMPARISON:  11/23/2020 FINDINGS: Somewhat heterogeneous distribution of radiotracer within the bilateral lung fields. Multiple wedge-shaped perfusion defects of the peripheral aspect of both lung fields including bilateral apical and left lateral segmental defects. IMPRESSION: High probability for pulmonary embolism. Electronically Signed   By: Davina Poke D.O.   On: 11/24/2020 12:43   US Venous Img Lower Bilateral (DVT)  Result Date: 11/25/2020 CLINICAL DATA:  Positive D-dimer Positive nuclear medicine pulmonary perfusion exam EXAM: BILATERAL LOWER EXTREMITY VENOUS DOPPLER ULTRASOUND TECHNIQUE: Gray-scale sonography with compression, as well as color and duplex ultrasound, were performed to evaluate the deep venous system(s) from the level of the common femoral vein through the popliteal and proximal calf veins. COMPARISON:  None. FINDINGS: VENOUS Normal compressibility of the common femoral, superficial femoral, and popliteal veins, as well as the visualized calf veins. Visualized portions of profunda femoral vein and great saphenous vein unremarkable. No filling defects to suggest DVT on grayscale or color  Doppler imaging. Doppler waveforms show normal direction of venous flow, normal respiratory plasticity and response to augmentation. OTHER None. Limitations: none IMPRESSION: No lower extremity DVT Electronically Signed   By: Miachel Roux M.D.   On: 11/25/2020 07:21   DG Chest Portable 1 View  Result Date: 11/23/2020 CLINICAL DATA:  Weakness.  Positive COVID test today EXAM: PORTABLE CHEST 1 VIEW COMPARISON:  06/13/2018 FINDINGS: Mild patchy airspace disease in the lung bases appears more prominent than on prior study. No pleural effusion. Negative for heart failure. IMPRESSION: Mild bibasilar airspace disease which could represent COVID pneumonia. Electronically Signed   By: Franchot Gallo M.D.   On: 11/23/2020 20:13     Subjective: She is breathing better. Cough improved.  She was able to ambulate yesterday with her walker.   Discharge Exam: Vitals:   11/27/20 0805 11/27/20 1312  BP: (!) 192/77 (!) 178/78  Pulse: (!) 52   Resp: 16   Temp: (!) 97.5 F (36.4 C)   SpO2: 97%      General: Pt is alert, awake, not in acute distress Cardiovascular: RRR, S1/S2 +, no rubs, no gallops Respiratory: CTA bilaterally, no wheezing, no rhonchi Abdominal: Soft, NT, ND, bowel sounds + Extremities: no edema, no cyanosis    The results of significant diagnostics from this hospitalization (including imaging, microbiology, ancillary and laboratory) are listed below for reference.     Microbiology: Recent Results (from the past 240 hour(s))  Urine Culture     Status: Abnormal   Collection Time: 11/23/20  7:37 PM   Specimen: Urine, Clean Catch  Result Value Ref Range Status   Specimen Description   Final    URINE, CLEAN CATCH Performed at [facility], [address], [place], [place] [address]    Special Requests   Final    NONE Performed at [facility], [address] [place], [place] [address]    Culture MULTIPLE SPECIES PRESENT, SUGGEST RECOLLECTION (A)  Final    Report Status 11/25/2020 FINAL  Final  Resp Panel by RT-PCR (Flu A&B, Covid) Nasopharyngeal Swab     Status: Abnormal   Collection Time: 11/23/20  9:15 PM   Specimen: Nasopharyngeal Swab; Nasopharyngeal(NP) swabs in vial transport medium  Result Value Ref Range Status   SARS Coronavirus 2 by RT PCR POSITIVE (A) NEGATIVE Final    Comment: RESULT CALLED TO, READ BACK BY AND VERIFIED WITH: YARELI LUNA@2226  11/23/20 RH (NOTE) SARS-CoV-2 target nucleic acids are DETECTED.  The  SARS-CoV-2 RNA is generally detectable in upper respiratory specimens during the acute phase of infection. Positive results are indicative of the presence of the identified virus, but do not rule out bacterial infection or co-infection with other pathogens not detected by the test. Clinical correlation with patient history and other diagnostic information is necessary to determine patient infection status. The expected result is Negative.  Fact Sheet for Patients: EntrepreneurPulse.com.au  Fact Sheet for Healthcare Providers: IncredibleEmployment.be  This test is not yet approved or cleared by the [place] FDA and  has been authorized for detection and/or diagnosis of SARS-CoV-2 by FDA under an Emergency Use Authorization (EUA).  This EUA will remain in effect (meaning this test can be used)  for the duration of  the COVID-19 declaration under Section 564(b)(1) of the Act, 21 U.S.C. section 360bbb-3(b)(1), unless the authorization is terminated or revoked sooner.     Influenza A by PCR NEGATIVE NEGATIVE Final   Influenza B by PCR NEGATIVE NEGATIVE Final    Comment: (NOTE) The Xpert Xpress SARS-CoV-2/FLU/RSV plus assay is intended as an aid in the diagnosis of influenza from Nasopharyngeal swab specimens and should not be used as a sole basis for treatment. Nasal washings and aspirates are unacceptable for Xpert Xpress SARS-CoV-2/FLU/RSV testing.  Fact Sheet for  Patients: EntrepreneurPulse.com.au  Fact Sheet for Healthcare Providers: IncredibleEmployment.be  This test is not yet approved or cleared by the [place] FDA and has been authorized for detection and/or diagnosis of SARS-CoV-2 by FDA under an Emergency Use Authorization (EUA). This EUA will remain in effect (meaning this test can be used) for the duration of the COVID-19 declaration under Section 564(b)(1) of the Act, 21 U.S.C. section 360bbb-3(b)(1), unless the authorization is terminated or revoked.  Performed at [facility], [address] [place], [place] [address]      Labs: BNP (last 3 results) No results for input(s): BNP in the last 8760 hours. Basic Metabolic Panel: Recent Labs  Lab 11/23/20 1923 11/24/20 0538 11/24/20 1012 11/25/20 0009 11/26/20 0255 11/27/20 0822  NA 133* 135  --  137 137 138  K 4.1 3.9  --  4.1 3.9 4.4  CL 101 104  --  107 107 104  CO2 21* 20*  --  21* 22 23  GLUCOSE 141* 209*  --  239* 206* 151*  BUN 20 18  --  20 27* 30*  CREATININE 0.77 0.65  --  0.65 0.65 0.73  CALCIUM 9.0 8.0*  --  8.1* 8.7* 9.1  MG  --   --  2.1  --   --   --    Liver Function Tests: Recent Labs  Lab 11/23/20 1923 11/24/20 0538 11/25/20 0009 11/26/20 0255  AST 115* 87* 57* 48*  ALT 84* 75* 60* 55*  ALKPHOS 331* 319* 307* 276*  BILITOT 0.8 0.8 0.7 0.6  PROT 7.7 7.4 7.3 7.2  ALBUMIN 3.5 3.2* 3.1* 3.2*   No results for input(s): LIPASE, AMYLASE in the last 168 hours. No results for input(s): AMMONIA in the last 168 hours. CBC: Recent Labs  Lab 11/23/20 1923 11/24/20 0538 11/25/20 0009 11/26/20 0255  WBC 5.7 4.0 5.4 11.9*  NEUTROABS 3.1 2.3 3.1 8.7*  HGB 15.0 15.1* 15.0 15.4*  HCT 45.1 45.5 46.1* 46.8*  MCV 88.4 90.8 90.0 89.1  PLT 204 169 196 208   Cardiac Enzymes: No results for input(s): CKTOTAL, CKMB, CKMBINDEX, TROPONINI in the last 168 hours. BNP: Invalid input(s): POCBNP CBG: Recent  Labs  Lab 11/26/20 0809 11/26/20 1206 11/26/20 1651 11/26/20 2117 11/27/20 0800  GLUCAP 181* 209* 177* 236* 150*   D-Dimer Recent Labs    11/25/20 0009 11/26/20 0255  DDIMER 2.40* 1.92*   Hgb A1c No results for input(s): HGBA1C in the last 72 hours. Lipid Profile No results for input(s): CHOL, HDL, LDLCALC, TRIG, CHOLHDL, LDLDIRECT in the last 72 hours. Thyroid function studies No results for input(s): TSH, T4TOTAL, T3FREE, THYROIDAB in the last 72 hours.  Invalid input(s): FREET3 Anemia work up Recent Labs    11/25/20 0009 11/26/20 0255  FERRITIN 330* 407*   Urinalysis    Component Value Date/Time   COLORURINE YELLOW (A) 11/23/2020 1937   APPEARANCEUR HAZY (A) 11/23/2020 1937   LABSPEC 1.017 11/23/2020 1937   PHURINE 5.0 11/23/2020 1937   GLUCOSEU NEGATIVE 11/23/2020 1937   HGBUR NEGATIVE 11/23/2020 1937   BILIRUBINUR NEGATIVE 11/23/2020 1937   BILIRUBINUR neg 05/09/2018 1105   KETONESUR NEGATIVE 11/23/2020 1937   PROTEINUR NEGATIVE 11/23/2020 1937   UROBILINOGEN 0.2 05/09/2018 1105   NITRITE POSITIVE (A) 11/23/2020 1937   LEUKOCYTESUR TRACE (A) 11/23/2020 1937   Sepsis Labs Invalid input(s): PROCALCITONIN,  WBC,  LACTICIDVEN Microbiology Recent Results (from the past 240 hour(s))  Urine Culture     Status: Abnormal   Collection Time: 11/23/20  7:37 PM   Specimen: Urine, Clean Catch  Result Value Ref Range Status   Specimen Description   Final    URINE, CLEAN CATCH Performed at [facility], [address] [place], [place] [address]    Special Requests   Final    NONE Performed at [facility], [address] [place], [place] [address]    Culture MULTIPLE SPECIES PRESENT, SUGGEST RECOLLECTION (A)  Final   Report Status 11/25/2020 FINAL  Final  Resp Panel by RT-PCR (Flu A&B, Covid) Nasopharyngeal Swab     Status: Abnormal   Collection Time: 11/23/20  9:15 PM   Specimen: Nasopharyngeal Swab; Nasopharyngeal(NP) swabs in vial  transport medium  Result Value Ref Range Status   SARS Coronavirus 2 by RT PCR POSITIVE (A) NEGATIVE Final    Comment: RESULT CALLED TO, READ BACK BY AND VERIFIED WITH: YARELI LUNA@2226  11/23/20 RH (NOTE) SARS-CoV-2 target nucleic acids are DETECTED.  The SARS-CoV-2 RNA is generally detectable in upper respiratory specimens during the acute phase of infection. Positive results are indicative of the presence of the identified virus, but do not rule out bacterial infection or co-infection with other pathogens not detected by the test. Clinical correlation with patient history and other diagnostic information is necessary to determine patient infection status. The expected result is Negative.  Fact Sheet for Patients: EntrepreneurPulse.com.au  Fact Sheet for Healthcare Providers: IncredibleEmployment.be  This test is not yet approved or cleared by the [place] FDA and  has been authorized for detection and/or diagnosis of SARS-CoV-2 by FDA under an Emergency Use Authorization (EUA).  This EUA will remain in effect (meaning this test can be used)  for the duration of  the COVID-19 declaration under Section 564(b)(1) of the Act, 21 U.S.C. section 360bbb-3(b)(1), unless the authorization is terminated or revoked sooner.     Influenza A by PCR NEGATIVE NEGATIVE Final   Influenza B by PCR NEGATIVE NEGATIVE Final    Comment: (NOTE) The Xpert Xpress SARS-CoV-2/FLU/RSV plus assay is intended as an aid in the diagnosis of influenza from Nasopharyngeal swab specimens and should not be used as a sole basis for treatment. Nasal washings and aspirates are  unacceptable for Xpert Xpress SARS-CoV-2/FLU/RSV testing.  Fact Sheet for Patients: EntrepreneurPulse.com.au  Fact Sheet for Healthcare Providers: IncredibleEmployment.be  This test is not yet approved or cleared by the [place] FDA and has been authorized  for detection and/or diagnosis of SARS-CoV-2 by FDA under an Emergency Use Authorization (EUA). This EUA will remain in effect (meaning this test can be used) for the duration of the COVID-19 declaration under Section 564(b)(1) of the Act, 21 U.S.C. section 360bbb-3(b)(1), unless the authorization is terminated or revoked.  Performed at [facility], [address], [place], [place] [address]      Time coordinating discharge: 40 minutes  SIGNED:   Elmarie Shiley, MD  [facility]

## 2020-11-27 NOTE — Progress Notes (Signed)
Pts BP 137/67 upon VS recheck.

## 2020-11-27 NOTE — Progress Notes (Signed)
Pt's PIV removed with tip intact x2. Discharge instructions explained with pt and daughter at bedside. Pt denies any questions or concerns. Pt to pick up meds from home pharmacy. Pt delivered walker to bedside. Home health services set up. Pt to follow up with primary care in one week. Pt and daughter agreeable to plan of care and discharge.

## 2020-11-27 NOTE — TOC Initial Note (Addendum)
Transition of Care [facility]) - Initial/Assessment Note    Patient Details  Name: Lauren Morris MRN: [id number] Date of Birth: Jan 07, 1932  [facility] CM/SW Contact:    Magnus Ivan, LCSW Phone Number: 11/27/2020, 9:44 AM  Clinical Narrative:                Patient requested CSW speak with daughter Lauren Morris regarding DC planning. Plan for DC today. CSW placed call to [place]. Lauren Morris reported patient lives alone but family plans to stay with her when she gets home for support. PCP is Dr. Derrel Nip. [facility]. Patient typically drives herself to appointments but family will be driving her for now. Patient has a cane, family is requesting a RW. CSW asked MD for orders and made referral to [employer]. RW to be delivered to bedside prior to DC today. Lauren Morris is agreeable to Home Health recommendation, no agency preference. Confirmed home address in chart. Lauren Morris will be the contact for Home Health scheduling, number in chart is correct. Referral made to [place]. No additional TOC needs identified.   Expected Discharge Plan: [place] Barriers to Discharge: Barriers Resolved   Patient Goals and CMS Choice Patient states their goals for this hospitalization and ongoing recovery are:: home with home health CMS Medicare.gov Compare Post Acute Care list provided to:: Patient Represenative (must comment) Choice offered to / list presented to : Adult Children  Expected Discharge Plan and Services Expected Discharge Plan: [place] arrangements for the past 2 months: Single Family Home Expected Discharge Date: 11/27/20               DME Arranged: [place] Rile rolling DME Agency:  [place] Aver) Date DME Agency Contacted: 11/27/20   Representative spoke with at DME Agency: Melene Muller [place]: PT,OT [place] Agency: [place] ([place]) Date [place]: 11/27/20   Representative spoke  with at [place]: Floydene Flock  Prior Living Arrangements/Services Living arrangements for the past 2 months: Single Family Home Lives with:: Self Patient language and need for interpreter reviewed:: Yes Do you feel safe going back to the place where you live?: Yes      Need for Family Participation in Patient Care: Yes (Comment) Care giver support system in place?: Yes (comment) Current home services: DME Criminal Activity/Legal Involvement Pertinent to Current Situation/Hospitalization: No - Comment as needed  Activities of Daily Living Home Assistive Devices/Equipment: None ADL Screening (condition at time of admission) Patient's cognitive ability adequate to safely complete daily activities?: Yes Is the patient deaf or have difficulty hearing?: No Does the patient have difficulty seeing, even when wearing glasses/contacts?: No Does the patient have difficulty concentrating, remembering, or making decisions?: Yes Patient able to express need for assistance with ADLs?: Yes Does the patient have difficulty dressing or bathing?: No Independently performs ADLs?: Yes (appropriate for developmental age) Does the patient have difficulty walking or climbing stairs?: No (per pt) Weakness of Legs: Both Weakness of Arms/Hands: None  Permission Sought/Granted Permission sought to share information with : Facility Art therapist granted to share information with : Yes, Verbal Permission Granted     Permission granted to share info w AGENCY: [place], DME agencies  Permission granted to share info w Relationship: daughter Lauren Morris per RN     Emotional Assessment       Orientation: : Oriented to Self,Oriented to Place,Oriented to  Time,Oriented to Situation  Alcohol / Substance Use: Not Applicable Psych Involvement: No (comment)  Admission diagnosis:  Simple chronic bronchitis (HCC) [J41.0] Obesity, diabetes, and hypertension syndrome ([place]) [E11.69, E66.9, E11.59,  I15.2] Cystitis [N30.90] Acute hypoxemic respiratory failure due to COVID-19 ([place]) [U07.1, J96.01] Pneumonia due to COVID-19 virus [U07.1, J12.82] Patient Active Problem List   Diagnosis Date Noted  . Obesity, diabetes, and hypertension syndrome ([place]) 11/24/2020  . Acute hypoxemic respiratory failure due to COVID-19 ([place]) 11/23/2020  . Chronic bronchitis ([place]) 01/16/2020  . Abnormal EKG 07/07/2019  . Generalized weakness 07/07/2019  . 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 Elevated hemoglobin ([place]) 06/15/2018  . Abnormal chest CT 05/25/2016  . Fatty liver disease, nonalcoholic 63/84/6659  . Obesity 11/13/2012  . Diabetes mellitus without complication ([place]) 93/57/0177  . Do not resuscitate discussion 03/11/2012  . Hyperlipidemia LDL goal <100 04/15/2011  . Essential hypertension 02/22/2011  . Acquired hypothyroidism 02/22/2011   PCP:  [name], MD Pharmacy:   [place] [place] [address] Phone: [phone] Fax: [phone]     Social Determinants of Health (SDOH) Interventions    Readmission Risk Interventions No flowsheet data found.

## 2020-11-27 NOTE — Plan of Care (Signed)
  Problem: Clinical Measurements: Goal: Ability to maintain clinical measurements within normal limits will improve Outcome: Progressing Goal: Will remain free from infection Outcome: Progressing Goal: Diagnostic test results will improve Outcome: Progressing Goal: Respiratory complications will improve Outcome: Progressing Goal: Cardiovascular complication will be avoided Outcome: Progressing   Problem: Pain Managment: Goal: General experience of comfort will improve Outcome: Progressing   Pt is involved in and agrees with the plan of care. V/S stable. Denies any pain or SOB. Has non-productive cough; scheduled mucinex given. Bradycardic at 40s while sleeping.

## 2020-11-27 NOTE — Discharge Instructions (Signed)
Your Blood Pressure  medication was change during this hospitalization: Stop taking atenolol. You have new medication for BP: Hydralazine Twice a day.  Your atenolol was stop because your heart rate was low during your sleep. Continue to use your CIPAP.   You will be discharge on Prednisone (treatment for Covid). Prednisone increases Blood sugar. I have prescribe Glipizide to help control Blood sugar while you are taking prednisone.

## 2020-11-27 NOTE — Progress Notes (Signed)
Pt BP remains elevated. Dr. Tyrell Antonio notified via secure chat. Order for one time dose of hydralazine 25 mg added to [facility]. See MAR for administration details. Pt given paper prescription for increase of hydralazine dose. Pt agreeable to plan of care and treatment.

## 2020-11-28 ENCOUNTER — Telehealth: Payer: Self-pay

## 2020-11-28 ENCOUNTER — Telehealth: Payer: Self-pay | Admitting: Internal Medicine

## 2020-11-28 MED ORDER — GLIPIZIDE 5 MG PO TABS: 2.5000 mg | ORAL_TABLET | Freq: Every day | ORAL | 0 refills | Status: DC

## 2020-11-28 NOTE — Telephone Encounter (Signed)
Opened in error

## 2020-11-28 NOTE — Telephone Encounter (Signed)
PTs daughter called in to advise that since PT has been in the hospital and now out that PT eye sight has been coming and going. She is concerned and trying to figure out if its because of all the meds that she is now taking causing issues with her Eye drops that she takes for Glaucoma or if its because she hasn't gotten and taken the meds for the blood sugar. PT would like a callback asap from [facility] or [name] in regards to this.

## 2020-11-28 NOTE — Addendum Note (Signed)
Addended by: [name] on: 11/28/2020 10:25 AM   Modules accepted: Orders

## 2020-11-28 NOTE — Telephone Encounter (Signed)
Pt's daughter is aware that medication has been sent in.

## 2020-11-28 NOTE — Telephone Encounter (Signed)
DONE

## 2020-11-28 NOTE — Telephone Encounter (Signed)
Pt's daughter called stating that pt was just discharged from the hospital. She stated that they were supposed to call in glipizide but [facility] stated that they do not have the rx. Pt was put on medication because the meds she is taking from the hospital has elevated her blood sugars. Is it okay to resend the glipizide to [facility]?

## 2020-11-28 NOTE — Telephone Encounter (Signed)
Spoke with Dr. Derrel Nip verbally about concerns below, she advised that pt's daughter call pt's eye doctor. Dr. Derrel Nip stated that the vision coming and going isn't because of the diabetes. Daughter gave a verbal understanding.

## 2020-11-29 ENCOUNTER — Telehealth: Payer: Self-pay

## 2020-11-29 ENCOUNTER — Other Ambulatory Visit: Payer: Self-pay | Admitting: Internal Medicine

## 2020-11-29 DIAGNOSIS — T380X5D Adverse effect of glucocorticoids and synthetic analogues, subsequent encounter: Secondary | ICD-10-CM | POA: Diagnosis not present

## 2020-11-29 DIAGNOSIS — M1711 Unilateral primary osteoarthritis, right knee: Secondary | ICD-10-CM | POA: Diagnosis not present

## 2020-11-29 DIAGNOSIS — I10 Essential (primary) hypertension: Secondary | ICD-10-CM | POA: Diagnosis not present

## 2020-11-29 DIAGNOSIS — Z9181 History of falling: Secondary | ICD-10-CM | POA: Diagnosis not present

## 2020-11-29 DIAGNOSIS — U071 COVID-19: Secondary | ICD-10-CM | POA: Diagnosis not present

## 2020-11-29 DIAGNOSIS — J9601 Acute respiratory failure with hypoxia: Secondary | ICD-10-CM | POA: Diagnosis not present

## 2020-11-29 DIAGNOSIS — R001 Bradycardia, unspecified: Secondary | ICD-10-CM | POA: Diagnosis not present

## 2020-11-29 DIAGNOSIS — J1282 Pneumonia due to coronavirus disease 2019: Secondary | ICD-10-CM | POA: Diagnosis not present

## 2020-11-29 DIAGNOSIS — I2699 Other pulmonary embolism without acute cor pulmonale: Secondary | ICD-10-CM | POA: Diagnosis not present

## 2020-11-29 DIAGNOSIS — Z90710 Acquired absence of both cervix and uterus: Secondary | ICD-10-CM | POA: Diagnosis not present

## 2020-11-29 DIAGNOSIS — E039 Hypothyroidism, unspecified: Secondary | ICD-10-CM | POA: Diagnosis not present

## 2020-11-29 DIAGNOSIS — H409 Unspecified glaucoma: Secondary | ICD-10-CM | POA: Diagnosis not present

## 2020-11-29 DIAGNOSIS — Z7901 Long term (current) use of anticoagulants: Secondary | ICD-10-CM | POA: Diagnosis not present

## 2020-11-29 DIAGNOSIS — G4730 Sleep apnea, unspecified: Secondary | ICD-10-CM | POA: Diagnosis not present

## 2020-11-29 DIAGNOSIS — Z87440 Personal history of urinary (tract) infections: Secondary | ICD-10-CM | POA: Diagnosis not present

## 2020-11-29 DIAGNOSIS — R739 Hyperglycemia, unspecified: Secondary | ICD-10-CM | POA: Diagnosis not present

## 2020-11-29 NOTE — Telephone Encounter (Signed)
Received a call from Lauren Morris stating that they need more Glipizide for [facility] as she was originally only given 3 pills and she had taken a whole pill instead of breaking it in half per directions. I informed her that a prescription for Glipizide was sent in today to [place] and was confirmed by the pharmacy at 2:39pm. Lauren Morris verbalized understanding and states she will call [facility].

## 2020-11-30 ENCOUNTER — Telehealth: Payer: Self-pay

## 2020-11-30 NOTE — Telephone Encounter (Signed)
Transition Care Management Follow-up Telephone Call  Date of discharge and from where: 11/27/20 from [facility]  How have you been since you were released from the hospital? Patient states,"Appetite is good, I don't hurt and I believe I am doing good."  Ordered bed side railing to assist with getting out of bed. Denies falls, fever, shortness of breath while resting, wheezing and all other symptoms.   Any questions or concerns? No  Items Reviewed:  Did the pt receive and understand the discharge instructions provided? Yes   Medications obtained and verified? Yes , daughter manages  Any new allergies since your discharge? No   Dietary orders reviewed? Heart healthy  Do you have support at home? Yes   Home Care and Equipment/Supplies: Were home health services ordered? Yes If so, what is the name of the agency? [facility]  Functional Questionnaire: (I = Independent and D = Dependent) ADLs: Aide assist  Bathing/Dressing- shower seat in place. Aide assist.  Meal Prep- Family assist  Eating- I  Maintaining continence- I  Transferring/Ambulation- walker, cane  Managing Meds- Daughter managing  Follow up appointments reviewed:   PCP Hospital f/u appt confirmed? Yes  Scheduled to see Pcp on 12/12/20 @ 11:30.   Are transportation arrangements needed? No   If their condition worsens, is the pt aware to call PCP or go to the Emergency Dept.? Yes  Was the patient provided with contact information for the PCP's office or ED? Yes  Was to pt encouraged to call back with questions or concerns? Yes

## 2020-11-30 NOTE — Telephone Encounter (Signed)
Transition Care Management Unsuccessful Follow-up Telephone Call  Date of discharge and from where:  11/27/20 from [facility]  Attempts:  2nd Attempt  Reason for unsuccessful TCM follow-up call:  Voice mail full HFU scheduled 6/20, will follow.

## 2020-12-02 DIAGNOSIS — E039 Hypothyroidism, unspecified: Secondary | ICD-10-CM | POA: Diagnosis not present

## 2020-12-02 DIAGNOSIS — J9601 Acute respiratory failure with hypoxia: Secondary | ICD-10-CM | POA: Diagnosis not present

## 2020-12-02 DIAGNOSIS — I10 Essential (primary) hypertension: Secondary | ICD-10-CM | POA: Diagnosis not present

## 2020-12-02 DIAGNOSIS — J1282 Pneumonia due to coronavirus disease 2019: Secondary | ICD-10-CM | POA: Diagnosis not present

## 2020-12-02 DIAGNOSIS — U071 COVID-19: Secondary | ICD-10-CM | POA: Diagnosis not present

## 2020-12-02 DIAGNOSIS — R001 Bradycardia, unspecified: Secondary | ICD-10-CM | POA: Diagnosis not present

## 2020-12-03 DIAGNOSIS — I10 Essential (primary) hypertension: Secondary | ICD-10-CM | POA: Diagnosis not present

## 2020-12-03 DIAGNOSIS — E039 Hypothyroidism, unspecified: Secondary | ICD-10-CM | POA: Diagnosis not present

## 2020-12-03 DIAGNOSIS — U071 COVID-19: Secondary | ICD-10-CM | POA: Diagnosis not present

## 2020-12-03 DIAGNOSIS — R001 Bradycardia, unspecified: Secondary | ICD-10-CM | POA: Diagnosis not present

## 2020-12-03 DIAGNOSIS — J1282 Pneumonia due to coronavirus disease 2019: Secondary | ICD-10-CM | POA: Diagnosis not present

## 2020-12-03 DIAGNOSIS — J9601 Acute respiratory failure with hypoxia: Secondary | ICD-10-CM | POA: Diagnosis not present

## 2020-12-06 DIAGNOSIS — J9601 Acute respiratory failure with hypoxia: Secondary | ICD-10-CM | POA: Diagnosis not present

## 2020-12-06 DIAGNOSIS — I10 Essential (primary) hypertension: Secondary | ICD-10-CM | POA: Diagnosis not present

## 2020-12-06 DIAGNOSIS — J1282 Pneumonia due to coronavirus disease 2019: Secondary | ICD-10-CM | POA: Diagnosis not present

## 2020-12-06 DIAGNOSIS — U071 COVID-19: Secondary | ICD-10-CM | POA: Diagnosis not present

## 2020-12-06 DIAGNOSIS — E039 Hypothyroidism, unspecified: Secondary | ICD-10-CM | POA: Diagnosis not present

## 2020-12-06 DIAGNOSIS — R001 Bradycardia, unspecified: Secondary | ICD-10-CM | POA: Diagnosis not present

## 2020-12-08 DIAGNOSIS — U071 COVID-19: Secondary | ICD-10-CM | POA: Diagnosis not present

## 2020-12-08 DIAGNOSIS — E039 Hypothyroidism, unspecified: Secondary | ICD-10-CM | POA: Diagnosis not present

## 2020-12-08 DIAGNOSIS — J1282 Pneumonia due to coronavirus disease 2019: Secondary | ICD-10-CM | POA: Diagnosis not present

## 2020-12-08 DIAGNOSIS — R001 Bradycardia, unspecified: Secondary | ICD-10-CM | POA: Diagnosis not present

## 2020-12-08 DIAGNOSIS — I10 Essential (primary) hypertension: Secondary | ICD-10-CM | POA: Diagnosis not present

## 2020-12-08 DIAGNOSIS — J9601 Acute respiratory failure with hypoxia: Secondary | ICD-10-CM | POA: Diagnosis not present

## 2020-12-09 DIAGNOSIS — J1282 Pneumonia due to coronavirus disease 2019: Secondary | ICD-10-CM | POA: Diagnosis not present

## 2020-12-09 DIAGNOSIS — J9601 Acute respiratory failure with hypoxia: Secondary | ICD-10-CM | POA: Diagnosis not present

## 2020-12-09 DIAGNOSIS — E039 Hypothyroidism, unspecified: Secondary | ICD-10-CM | POA: Diagnosis not present

## 2020-12-09 DIAGNOSIS — I10 Essential (primary) hypertension: Secondary | ICD-10-CM | POA: Diagnosis not present

## 2020-12-09 DIAGNOSIS — U071 COVID-19: Secondary | ICD-10-CM | POA: Diagnosis not present

## 2020-12-09 DIAGNOSIS — R001 Bradycardia, unspecified: Secondary | ICD-10-CM | POA: Diagnosis not present

## 2020-12-12 ENCOUNTER — Ambulatory Visit (INDEPENDENT_AMBULATORY_CARE_PROVIDER_SITE_OTHER): Payer: Medicare Other | Admitting: Internal Medicine

## 2020-12-12 ENCOUNTER — Other Ambulatory Visit: Payer: Self-pay

## 2020-12-12 ENCOUNTER — Encounter: Payer: Self-pay | Admitting: Internal Medicine

## 2020-12-12 VITALS — BP 110/62 | HR 110 | Temp 98.5°F | Ht 62.0 in | Wt 159.8 lb

## 2020-12-12 DIAGNOSIS — E1159 Type 2 diabetes mellitus with other circulatory complications: Secondary | ICD-10-CM

## 2020-12-12 DIAGNOSIS — E1169 Type 2 diabetes mellitus with other specified complication: Secondary | ICD-10-CM

## 2020-12-12 DIAGNOSIS — E669 Obesity, unspecified: Secondary | ICD-10-CM

## 2020-12-12 DIAGNOSIS — Z86711 Personal history of pulmonary embolism: Secondary | ICD-10-CM | POA: Insufficient documentation

## 2020-12-12 DIAGNOSIS — J1282 Pneumonia due to coronavirus disease 2019: Secondary | ICD-10-CM

## 2020-12-12 DIAGNOSIS — U071 COVID-19: Secondary | ICD-10-CM | POA: Diagnosis not present

## 2020-12-12 DIAGNOSIS — Z09 Encounter for follow-up examination after completed treatment for conditions other than malignant neoplasm: Secondary | ICD-10-CM | POA: Insufficient documentation

## 2020-12-12 DIAGNOSIS — I2699 Other pulmonary embolism without acute cor pulmonale: Secondary | ICD-10-CM

## 2020-12-12 DIAGNOSIS — E039 Hypothyroidism, unspecified: Secondary | ICD-10-CM | POA: Diagnosis not present

## 2020-12-12 DIAGNOSIS — D582 Other hemoglobinopathies: Secondary | ICD-10-CM

## 2020-12-12 DIAGNOSIS — I10 Essential (primary) hypertension: Secondary | ICD-10-CM | POA: Diagnosis not present

## 2020-12-12 DIAGNOSIS — I152 Hypertension secondary to endocrine disorders: Secondary | ICD-10-CM | POA: Diagnosis not present

## 2020-12-12 DIAGNOSIS — R9389 Abnormal findings on diagnostic imaging of other specified body structures: Secondary | ICD-10-CM | POA: Diagnosis not present

## 2020-12-12 HISTORY — DX: Pneumonia due to coronavirus disease 2019: J12.82

## 2020-12-12 HISTORY — DX: COVID-19: U07.1

## 2020-12-12 LAB — CBC WITH DIFFERENTIAL/PLATELET
Basophils Absolute: 0.1 10*3/uL (ref 0.0–0.1)
Basophils Relative: 0.9 % (ref 0.0–3.0)
Eosinophils Absolute: 0.2 10*3/uL (ref 0.0–0.7)
Eosinophils Relative: 1.7 % (ref 0.0–5.0)
HCT: 43 % (ref 36.0–46.0)
Hemoglobin: 14.2 g/dL (ref 12.0–15.0)
Lymphocytes Relative: 26.7 % (ref 12.0–46.0)
Lymphs Abs: 2.8 10*3/uL (ref 0.7–4.0)
MCHC: 33.1 g/dL (ref 30.0–36.0)
MCV: 90.4 fl (ref 78.0–100.0)
Monocytes Absolute: 1.1 10*3/uL — ABNORMAL HIGH (ref 0.1–1.0)
Monocytes Relative: 10.5 % (ref 3.0–12.0)
Neutro Abs: 6.3 10*3/uL (ref 1.4–7.7)
Neutrophils Relative %: 60.2 % (ref 43.0–77.0)
Platelets: 209 10*3/uL (ref 150.0–400.0)
RBC: 4.75 Mil/uL (ref 3.87–5.11)
RDW: 14.3 % (ref 11.5–15.5)
WBC: 10.4 10*3/uL (ref 4.0–10.5)

## 2020-12-12 LAB — COMPREHENSIVE METABOLIC PANEL
ALT: 71 U/L — ABNORMAL HIGH (ref 0–35)
AST: 53 U/L — ABNORMAL HIGH (ref 0–37)
Albumin: 3.6 g/dL (ref 3.5–5.2)
Alkaline Phosphatase: 315 U/L — ABNORMAL HIGH (ref 39–117)
BUN: 16 mg/dL (ref 6–23)
CO2: 24 mEq/L (ref 19–32)
Calcium: 8.9 mg/dL (ref 8.4–10.5)
Chloride: 101 mEq/L (ref 96–112)
Creatinine, Ser: 0.8 mg/dL (ref 0.40–1.20)
GFR: 66.69 mL/min (ref >60.00)
Glucose, Bld: 100 mg/dL — ABNORMAL HIGH (ref 70–99)
Potassium: 4.5 mEq/L (ref 3.5–5.1)
Sodium: 135 mEq/L (ref 135–145)
Total Bilirubin: 0.8 mg/dL (ref 0.2–1.2)
Total Protein: 6.5 g/dL (ref 6.0–8.3)

## 2020-12-12 LAB — HEMOGLOBIN A1C: Hgb A1c MFr Bld: 6.8 % — ABNORMAL HIGH (ref 4.6–6.5)

## 2020-12-12 MED ORDER — FAMOTIDINE 20 MG PO TABS: 20.0000 mg | ORAL_TABLET | Freq: Two times a day (BID) | ORAL | 1 refills | Status: DC

## 2020-12-12 MED ORDER — ATENOLOL 25 MG PO TABS: 25.0000 mg | ORAL_TABLET | Freq: Two times a day (BID) | ORAL | 0 refills | Status: DC

## 2020-12-12 NOTE — Assessment & Plan Note (Signed)
There is no evidence of ILD or COPD,  But she does have severe OSA, pulmonary hypertension and restrictive lung disease by PFTs.

## 2020-12-12 NOTE — Assessment & Plan Note (Signed)
Patient is stable post discharge ;  All issues and questions about discharge plans at the visit today for hospital follow up were addressed.  All labs , imaging studies and progress notes from admission were reviewed with patient today  .

## 2020-12-12 NOTE — Patient Instructions (Addendum)
Continue  Eliquis  for a minimum of 3 months.  We will make sure the blood clots are gone before we discontinue the medication   Resume atenolol  25 mg two times  daily  .  This is for your heart rate and your overactive thyroid.  Start the atenolol today   Stop the hydralazine .    Repeat the chest x ray in mid July here in the office.

## 2020-12-12 NOTE — Assessment & Plan Note (Signed)
hemoglobin A1c has been repeatedly  <7.0 without pharmacologic intervention.  She was prescribed glipizide short term for management of prednisone induced hyperglycemia during COVID infection /hospitalization.   Patient is  Up to date on eye exam and foot exam   Continue ARB for  microalbuminuria o.  Lab Results  Component Value Date   HGBA1C 6.8 (H) 12/12/2020   Lab Results  Component Value Date   MICROALBUR 2.2 (H) 08/11/2020   Lab Results  Component Value Date   CREATININE 0.80 12/12/2020

## 2020-12-12 NOTE — Assessment & Plan Note (Signed)
Resume atenolol and stop hydralazine given today's hypotension and tachycardia, also noted by PT yesterday

## 2020-12-12 NOTE — Progress Notes (Signed)
Subjective:  Patient ID: Lauren Morris, female    DOB: 06-02-1932  Age: 87 y.o. MRN: [id number]  CC: The primary encounter diagnosis was Elevated hemoglobin ([place]). Diagnoses of Obesity, diabetes, and hypertension syndrome ([place]), COVID-19 virus infection, Hospital discharge follow-up, Essential hypertension, Abnormal chest CT, Acquired hypothyroidism, Pneumonia due to COVID-19 virus, and Pulmonary embolism and infarction [facility]) were also pertinent to this visit.  HPI Lauren Morris presents for [place].  Accompanied by daughter Lauren Morris   This visit occurred during the SARS-CoV-2 public health emergency.  Safety protocols were in place, including screening questions prior to the visit, additional usage of staff PPE, and extensive cleaning of exam room while observing appropriate contact time as indicated for disinfecting solutions.   Admitted June 1 to [facility] with Hypoxic respiratory  failure due to COVID infection ; treated with steroids and Remdesivir. diagnosed with  probable PE by V/Q Scan. Eliquis started .  LE doppler negative for DVT.  Atenolol stopped due to bradycardia, hydralazine 50 mg bid started  Discharged home on June 5 with home PT   Feels generally well.  Appetite fine,  energy level fine..  Denies cough and SOB.  still fighting to wear CPAP nightly (can't keep it on as a side sleeper,  can't sleep on back). PT noted elevated heart rate yesterday Noticed asymptomatic bruising of right calf and foot since home  Meds reviewed.    Lab Results  Component Value Date   HGBA1C 6.8 (H) 12/12/2020    Outpatient Medications Prior to Visit  Medication Sig Dispense Refill   apixaban (ELIQUIS) 5 MG TABS tablet Take 1 tablet (5 mg total) by mouth 2 (two) times daily. 60 tablet 4   Ascorbic Acid (VITAMIN C) 1000 MG tablet Take 1,000 mg by mouth daily.     Calcium Carb-Cholecalciferol (CALCIUM 1000 + D PO) Take 1 tablet by mouth daily.     dorzolamide-timolol (COSOPT)  22.3-6.8 MG/ML ophthalmic solution Place 1 drop into both eyes 2 (two) times daily.     fish oil-omega-3 fatty acids 1000 MG capsule Take 2 g by mouth daily.     furosemide (LASIX) 20 MG tablet TAKE ONE TABLET BY MOUTH DAILY AS NEEDEDFOR FLUID RETENTION (Patient taking differently: Take 20 mg by mouth daily. TAKE ONE TABLET BY MOUTH DAILY AS NEEDED FOR FLUID RETENTION) 30 tablet 0   Multiple Vitamin (MULTIVITAMIN WITH MINERALS) TABS tablet Take 1 tablet by mouth daily. 30 tablet 0   telmisartan (MICARDIS) 40 MG tablet Take 1 tablet (40 mg total) by mouth at bedtime. 90 tablet 1   tiotropium (SPIRIVA HANDIHALER) 18 MCG inhalation capsule Place 1 capsule (18 mcg total) into inhaler and inhale daily. 30 capsule 12   zinc sulfate 220 (50 Zn) MG capsule Take 1 capsule (220 mg total) by mouth daily. 30 capsule 0   famotidine (PEPCID) 20 MG tablet Take 1 tablet (20 mg total) by mouth 2 (two) times daily. 30 tablet 0   hydrALAZINE (APRESOLINE) 25 MG tablet Take 2 tablets (50 mg total) by mouth 2 (two) times daily. 60 tablet 1   apixaban (ELIQUIS) 5 MG TABS tablet Take 2 tablets (10 mg total) by mouth 2 (two) times daily for 7 days. (Patient not taking: Reported on 12/12/2020) 28 tablet 0   glipiZIDE (GLUCOTROL) 5 MG tablet ONE-HALF TABLET BY MOUTH EVERY DAY BEFORE BREAKFAST FOR 5 DAYS (Patient not taking: Reported on 12/12/2020) 30 tablet 1   guaiFENesin (MUCINEX) 600 MG 12 hr  tablet Take 1 tablet (600 mg total) by mouth 2 (two) times daily. (Patient not taking: Reported on 12/12/2020) 30 tablet 0   guaiFENesin-dextromethorphan (ROBITUSSIN DM) 100-10 MG/5ML syrup Take 10 mLs by mouth every 4 (four) hours as needed for cough. (Patient not taking: Reported on 12/12/2020) 118 mL 0   No facility-administered medications prior to visit.    Review of Systems;  Patient denies headache, fevers, malaise, unintentional weight loss, skin rash, eye pain, sinus congestion and sinus pain, sore throat, dysphagia,   hemoptysis , cough, dyspnea, wheezing, chest pain, palpitations, orthopnea, edema, abdominal pain, nausea, melena, diarrhea, constipation, flank pain, dysuria, hematuria, urinary  Frequency, nocturia, numbness, tingling, seizures,  Focal weakness, Loss of consciousness,  Tremor, insomnia, depression, anxiety, and suicidal ideation.      Objective:  BP 110/62 (BP Location: Left Arm, Patient Position: Sitting, Cuff Size: Normal)   Pulse (!) 110   Temp 98.5 F (36.9 C) (Oral)   Ht 5\' 2"  (1.575 m)   Wt 159 lb 12.8 oz (72.5 kg)   SpO2 97%   BMI 29.23 kg/m   BP Readings from Last 3 Encounters:  12/12/20 110/62  11/27/20 137/67  09/07/20 138/68    Wt Readings from Last 3 Encounters:  12/12/20 159 lb 12.8 oz (72.5 kg)  11/23/20 160 lb (72.6 kg)  08/30/20 165 lb 9.6 oz (75.1 kg)    General appearance: alert, cooperative and appears stated age Ears: normal TM's and external ear canals both ears Throat: lips, mucosa, and tongue normal; teeth and gums normal Neck: no adenopathy, no carotid bruit, supple, symmetrical, trachea midline and thyroid not enlarged, symmetric, no tenderness/mass/nodules Back: symmetric, no curvature. ROM normal. No CVA tenderness. Lungs: clear to auscultation bilaterally Heart: regular rate and rhythm, S1, S2 normal, no murmur, click, rub or gallop Abdomen: soft, non-tender; bowel sounds normal; no masses,  no organomegaly Pulses: 2+ and symmetric Skin: Skin color, texture, turgor normal. No rashes or lesions Lymph nodes: Cervical, supraclavicular, and axillary nodes normal.  Lab Results  Component Value Date   HGBA1C 6.8 (H) 12/12/2020   HGBA1C 5.8 08/11/2020   HGBA1C 5.8 01/14/2020    Lab Results  Component Value Date   CREATININE 0.80 12/12/2020   CREATININE 0.73 11/27/2020   CREATININE 0.65 11/26/2020    Lab Results  Component Value Date   WBC 10.4 12/12/2020   HGB 14.2 12/12/2020   HCT 43.0 12/12/2020   PLT 209.0 12/12/2020   GLUCOSE 100  (H) 12/12/2020   CHOL 156 01/14/2020   TRIG 101.0 01/14/2020   HDL 51.30 01/14/2020   LDLDIRECT 80.0 04/08/2017   LDLCALC 85 01/14/2020   ALT 71 (H) 12/12/2020   AST 53 (H) 12/12/2020   NA 135 12/12/2020   K 4.5 12/12/2020   CL 101 12/12/2020   CREATININE 0.80 12/12/2020   BUN 16 12/12/2020   CO2 24 12/12/2020   TSH 4.38 08/30/2020   HGBA1C 6.8 (H) 12/12/2020   MICROALBUR 2.2 (H) 08/11/2020    NM Pulmonary Perfusion  Result Date: 11/24/2020 CLINICAL DATA:  Shortness of breath.  COVID-19 positive EXAM: NUCLEAR MEDICINE PERFUSION LUNG SCAN TECHNIQUE: Perfusion images were obtained in multiple projections after intravenous injection of radiopharmaceutical. Ventilation scans intentionally deferred if perfusion scan and chest x-ray adequate for interpretation during COVID 19 epidemic. RADIOPHARMACEUTICALS:  4.0 mCi Tc-61m MAA IV COMPARISON:  11/23/2020 FINDINGS: Somewhat heterogeneous distribution of radiotracer within the bilateral lung fields. Multiple wedge-shaped perfusion defects of the peripheral aspect of both lung fields including bilateral  apical and left lateral segmental defects. IMPRESSION: High probability for pulmonary embolism. Electronically Signed   By: Davina Poke D.O.   On: 11/24/2020 12:43   DG Chest Portable 1 View  Result Date: 11/23/2020 CLINICAL DATA:  Weakness.  Positive COVID test today EXAM: PORTABLE CHEST 1 VIEW COMPARISON:  06/13/2018 FINDINGS: Mild patchy airspace disease in the lung bases appears more prominent than on prior study. No pleural effusion. Negative for heart failure. IMPRESSION: Mild bibasilar airspace disease which could represent COVID pneumonia. Electronically Signed   By: Franchot Gallo M.D.   On: 11/23/2020 20:13    Assessment & Plan:   Problem List Items Addressed This Visit       Unprioritized   Abnormal chest CT     There is no evidence of ILD or COPD,  But she does have severe OSA, pulmonary hypertension and restrictive lung  disease by PFTs.         Acquired hypothyroidism    Her disease is managed with methimazole and beta blocker which was suspended during hospitalization due to nocturnal bradycardia,  But was resumed today due to tachycardia.  Lab Results  Component Value Date   TSH 4.38 08/30/2020            Relevant Medications   atenolol (TENORMIN) 25 MG tablet   Elevated hemoglobin (HCC) - Primary    Resolved, repeat hgb and platelets are normal  Lab Results  Component Value Date   WBC 10.4 12/12/2020   HGB 14.2 12/12/2020   HCT 43.0 12/12/2020   MCV 90.4 12/12/2020   PLT 209.0 12/12/2020          Relevant Orders   CBC with Differential/Platelet (Completed)   Essential hypertension    Resume atenolol and stop hydralazine given today's hypotension and tachycardia, also noted by PT yesterday       Relevant Medications   atenolol (TENORMIN) 25 MG tablet   Hospital discharge follow-up    Patient is stable post discharge ;  All issues and questions about discharge plans at the visit today for hospital follow up were addressed.  All labs , imaging studies and progress notes from admission were reviewed with patient today  .       Obesity, diabetes, and hypertension syndrome (HCC)     hemoglobin A1c has been repeatedly  <7.0 without pharmacologic intervention.  She was prescribed glipizide short term for management of prednisone induced hyperglycemia during COVID infection /hospitalization.   Patient is  Up to date on eye exam and foot exam   Continue ARB for  microalbuminuria o.  Lab Results  Component Value Date   HGBA1C 6.8 (H) 12/12/2020   Lab Results  Component Value Date   MICROALBUR 2.2 (H) 08/11/2020   Lab Results  Component Value Date   CREATININE 0.80 12/12/2020          Relevant Medications   atenolol (TENORMIN) 25 MG tablet   Other Relevant Orders   Hemoglobin A1c (Completed)   Comprehensive metabolic panel (Completed)   Pneumonia due to COVID-19 virus     Clinically resolved.  Patchy airspace opacities noted on admission film.  Repeat due mid July       Pulmonary embolism and infarction [facility])    Multiple emboli Suggested by V/Q scan.  Provoked by COVID 19 . unclear why CT chest was not done given  Normal Cr throughout hospitalization .  Will continue Elliquis for  An undetermined period of time. (6 months,  Most likely)  Relevant Medications   atenolol (TENORMIN) 25 MG tablet   Other Visit Diagnoses     COVID-19 virus infection       Relevant Orders   DG Chest 2 View       I have discontinued Colleena M. Kozel's guaiFENesin, guaiFENesin-dextromethorphan, hydrALAZINE, and glipiZIDE. I am also having her start on atenolol. Additionally, I am having her maintain her Calcium Carb-Cholecalciferol (CALCIUM 1000 + D PO), fish oil-omega-3 fatty acids, dorzolamide-timolol, furosemide, vitamin C, Spiriva HandiHaler, telmisartan, apixaban, multivitamin with minerals, and zinc sulfate.  Meds ordered this encounter  Medications   atenolol (TENORMIN) 25 MG tablet    Sig: Take 1 tablet (25 mg total) by mouth 2 (two) times daily.    Dispense:  180 tablet    Refill:  0    Medications Discontinued During This Encounter  Medication Reason   hydrALAZINE (APRESOLINE) 25 MG tablet    guaiFENesin (MUCINEX) 600 MG 12 hr tablet    glipiZIDE (GLUCOTROL) 5 MG tablet    apixaban (ELIQUIS) 5 MG TABS tablet    guaiFENesin-dextromethorphan (ROBITUSSIN DM) 100-10 MG/5ML syrup     Follow-up: Return in about 3 months (around 03/14/2021).   [name], MD

## 2020-12-12 NOTE — Assessment & Plan Note (Addendum)
Multiple emboli Suggested by V/Q scan.  Provoked by COVID 19 . unclear why CT chest was not done given  Normal Cr throughout hospitalization .  Will continue Elliquis for  An undetermined period of time. (6 months,  Most likely)

## 2020-12-12 NOTE — Assessment & Plan Note (Addendum)
Her disease is managed with methimazole and beta blocker which was suspended during hospitalization due to nocturnal bradycardia,  But was resumed today due to tachycardia.  Lab Results  Component Value Date   TSH 4.38 08/30/2020

## 2020-12-12 NOTE — Assessment & Plan Note (Signed)
Resolved, repeat hgb and platelets are normal  Lab Results  Component Value Date   WBC 10.4 12/12/2020   HGB 14.2 12/12/2020   HCT 43.0 12/12/2020   MCV 90.4 12/12/2020   PLT 209.0 12/12/2020

## 2020-12-12 NOTE — Assessment & Plan Note (Signed)
Clinically resolved.  Patchy airspace opacities noted on admission film.  Repeat due mid July

## 2020-12-13 DIAGNOSIS — E039 Hypothyroidism, unspecified: Secondary | ICD-10-CM | POA: Diagnosis not present

## 2020-12-13 DIAGNOSIS — U071 COVID-19: Secondary | ICD-10-CM | POA: Diagnosis not present

## 2020-12-13 DIAGNOSIS — R001 Bradycardia, unspecified: Secondary | ICD-10-CM | POA: Diagnosis not present

## 2020-12-13 DIAGNOSIS — J9601 Acute respiratory failure with hypoxia: Secondary | ICD-10-CM | POA: Diagnosis not present

## 2020-12-13 DIAGNOSIS — I10 Essential (primary) hypertension: Secondary | ICD-10-CM | POA: Diagnosis not present

## 2020-12-13 DIAGNOSIS — J1282 Pneumonia due to coronavirus disease 2019: Secondary | ICD-10-CM | POA: Diagnosis not present

## 2020-12-16 DIAGNOSIS — J9601 Acute respiratory failure with hypoxia: Secondary | ICD-10-CM | POA: Diagnosis not present

## 2020-12-16 DIAGNOSIS — R001 Bradycardia, unspecified: Secondary | ICD-10-CM | POA: Diagnosis not present

## 2020-12-16 DIAGNOSIS — J1282 Pneumonia due to coronavirus disease 2019: Secondary | ICD-10-CM | POA: Diagnosis not present

## 2020-12-16 DIAGNOSIS — U071 COVID-19: Secondary | ICD-10-CM | POA: Diagnosis not present

## 2020-12-16 DIAGNOSIS — I10 Essential (primary) hypertension: Secondary | ICD-10-CM | POA: Diagnosis not present

## 2020-12-16 DIAGNOSIS — E039 Hypothyroidism, unspecified: Secondary | ICD-10-CM | POA: Diagnosis not present

## 2020-12-20 DIAGNOSIS — J1282 Pneumonia due to coronavirus disease 2019: Secondary | ICD-10-CM | POA: Diagnosis not present

## 2020-12-20 DIAGNOSIS — E039 Hypothyroidism, unspecified: Secondary | ICD-10-CM | POA: Diagnosis not present

## 2020-12-20 DIAGNOSIS — J9601 Acute respiratory failure with hypoxia: Secondary | ICD-10-CM | POA: Diagnosis not present

## 2020-12-20 DIAGNOSIS — R001 Bradycardia, unspecified: Secondary | ICD-10-CM | POA: Diagnosis not present

## 2020-12-20 DIAGNOSIS — I10 Essential (primary) hypertension: Secondary | ICD-10-CM | POA: Diagnosis not present

## 2020-12-20 DIAGNOSIS — U071 COVID-19: Secondary | ICD-10-CM | POA: Diagnosis not present

## 2020-12-21 DIAGNOSIS — U071 COVID-19: Secondary | ICD-10-CM | POA: Diagnosis not present

## 2020-12-21 DIAGNOSIS — I10 Essential (primary) hypertension: Secondary | ICD-10-CM | POA: Diagnosis not present

## 2020-12-21 DIAGNOSIS — R001 Bradycardia, unspecified: Secondary | ICD-10-CM | POA: Diagnosis not present

## 2020-12-21 DIAGNOSIS — J9601 Acute respiratory failure with hypoxia: Secondary | ICD-10-CM | POA: Diagnosis not present

## 2020-12-21 DIAGNOSIS — E039 Hypothyroidism, unspecified: Secondary | ICD-10-CM | POA: Diagnosis not present

## 2020-12-21 DIAGNOSIS — J1282 Pneumonia due to coronavirus disease 2019: Secondary | ICD-10-CM | POA: Diagnosis not present

## 2020-12-22 DIAGNOSIS — E039 Hypothyroidism, unspecified: Secondary | ICD-10-CM | POA: Diagnosis not present

## 2020-12-22 DIAGNOSIS — J1282 Pneumonia due to coronavirus disease 2019: Secondary | ICD-10-CM | POA: Diagnosis not present

## 2020-12-22 DIAGNOSIS — I10 Essential (primary) hypertension: Secondary | ICD-10-CM | POA: Diagnosis not present

## 2020-12-22 DIAGNOSIS — R001 Bradycardia, unspecified: Secondary | ICD-10-CM | POA: Diagnosis not present

## 2020-12-22 DIAGNOSIS — U071 COVID-19: Secondary | ICD-10-CM | POA: Diagnosis not present

## 2020-12-22 DIAGNOSIS — J9601 Acute respiratory failure with hypoxia: Secondary | ICD-10-CM | POA: Diagnosis not present

## 2020-12-28 DIAGNOSIS — J1282 Pneumonia due to coronavirus disease 2019: Secondary | ICD-10-CM | POA: Diagnosis not present

## 2020-12-28 DIAGNOSIS — U071 COVID-19: Secondary | ICD-10-CM | POA: Diagnosis not present

## 2020-12-28 DIAGNOSIS — I10 Essential (primary) hypertension: Secondary | ICD-10-CM | POA: Diagnosis not present

## 2020-12-28 DIAGNOSIS — J9601 Acute respiratory failure with hypoxia: Secondary | ICD-10-CM | POA: Diagnosis not present

## 2020-12-28 DIAGNOSIS — R001 Bradycardia, unspecified: Secondary | ICD-10-CM | POA: Diagnosis not present

## 2020-12-28 DIAGNOSIS — E039 Hypothyroidism, unspecified: Secondary | ICD-10-CM | POA: Diagnosis not present

## 2020-12-29 DIAGNOSIS — E039 Hypothyroidism, unspecified: Secondary | ICD-10-CM | POA: Diagnosis not present

## 2020-12-29 DIAGNOSIS — H401131 Primary open-angle glaucoma, bilateral, mild stage: Secondary | ICD-10-CM | POA: Diagnosis not present

## 2020-12-29 DIAGNOSIS — M1711 Unilateral primary osteoarthritis, right knee: Secondary | ICD-10-CM | POA: Diagnosis not present

## 2020-12-29 DIAGNOSIS — J1282 Pneumonia due to coronavirus disease 2019: Secondary | ICD-10-CM | POA: Diagnosis not present

## 2020-12-29 DIAGNOSIS — Z90710 Acquired absence of both cervix and uterus: Secondary | ICD-10-CM | POA: Diagnosis not present

## 2020-12-29 DIAGNOSIS — T380X5D Adverse effect of glucocorticoids and synthetic analogues, subsequent encounter: Secondary | ICD-10-CM | POA: Diagnosis not present

## 2020-12-29 DIAGNOSIS — H35373 Puckering of macula, bilateral: Secondary | ICD-10-CM | POA: Diagnosis not present

## 2020-12-29 DIAGNOSIS — Z9181 History of falling: Secondary | ICD-10-CM | POA: Diagnosis not present

## 2020-12-29 DIAGNOSIS — R001 Bradycardia, unspecified: Secondary | ICD-10-CM | POA: Diagnosis not present

## 2020-12-29 DIAGNOSIS — I10 Essential (primary) hypertension: Secondary | ICD-10-CM | POA: Diagnosis not present

## 2020-12-29 DIAGNOSIS — G4730 Sleep apnea, unspecified: Secondary | ICD-10-CM | POA: Diagnosis not present

## 2020-12-29 DIAGNOSIS — H35341 Macular cyst, hole, or pseudohole, right eye: Secondary | ICD-10-CM | POA: Diagnosis not present

## 2020-12-29 DIAGNOSIS — R739 Hyperglycemia, unspecified: Secondary | ICD-10-CM | POA: Diagnosis not present

## 2020-12-29 DIAGNOSIS — H409 Unspecified glaucoma: Secondary | ICD-10-CM | POA: Diagnosis not present

## 2020-12-29 DIAGNOSIS — D3131 Benign neoplasm of right choroid: Secondary | ICD-10-CM | POA: Diagnosis not present

## 2020-12-29 DIAGNOSIS — I2699 Other pulmonary embolism without acute cor pulmonale: Secondary | ICD-10-CM | POA: Diagnosis not present

## 2020-12-29 DIAGNOSIS — Z7901 Long term (current) use of anticoagulants: Secondary | ICD-10-CM | POA: Diagnosis not present

## 2020-12-29 DIAGNOSIS — J9601 Acute respiratory failure with hypoxia: Secondary | ICD-10-CM | POA: Diagnosis not present

## 2020-12-29 DIAGNOSIS — Z87440 Personal history of urinary (tract) infections: Secondary | ICD-10-CM | POA: Diagnosis not present

## 2020-12-29 DIAGNOSIS — U071 COVID-19: Secondary | ICD-10-CM | POA: Diagnosis not present

## 2021-01-02 DIAGNOSIS — U071 COVID-19: Secondary | ICD-10-CM | POA: Diagnosis not present

## 2021-01-02 DIAGNOSIS — I10 Essential (primary) hypertension: Secondary | ICD-10-CM | POA: Diagnosis not present

## 2021-01-02 DIAGNOSIS — J1282 Pneumonia due to coronavirus disease 2019: Secondary | ICD-10-CM | POA: Diagnosis not present

## 2021-01-02 DIAGNOSIS — E039 Hypothyroidism, unspecified: Secondary | ICD-10-CM | POA: Diagnosis not present

## 2021-01-02 DIAGNOSIS — J9601 Acute respiratory failure with hypoxia: Secondary | ICD-10-CM | POA: Diagnosis not present

## 2021-01-02 DIAGNOSIS — R001 Bradycardia, unspecified: Secondary | ICD-10-CM | POA: Diagnosis not present

## 2021-01-04 DIAGNOSIS — E039 Hypothyroidism, unspecified: Secondary | ICD-10-CM | POA: Diagnosis not present

## 2021-01-04 DIAGNOSIS — J9601 Acute respiratory failure with hypoxia: Secondary | ICD-10-CM | POA: Diagnosis not present

## 2021-01-04 DIAGNOSIS — U071 COVID-19: Secondary | ICD-10-CM

## 2021-01-04 DIAGNOSIS — I2699 Other pulmonary embolism without acute cor pulmonale: Secondary | ICD-10-CM | POA: Diagnosis not present

## 2021-01-04 DIAGNOSIS — Z87440 Personal history of urinary (tract) infections: Secondary | ICD-10-CM

## 2021-01-04 DIAGNOSIS — G4730 Sleep apnea, unspecified: Secondary | ICD-10-CM | POA: Diagnosis not present

## 2021-01-04 DIAGNOSIS — R001 Bradycardia, unspecified: Secondary | ICD-10-CM | POA: Diagnosis not present

## 2021-01-04 DIAGNOSIS — J1282 Pneumonia due to coronavirus disease 2019: Secondary | ICD-10-CM | POA: Diagnosis not present

## 2021-01-04 DIAGNOSIS — R739 Hyperglycemia, unspecified: Secondary | ICD-10-CM | POA: Diagnosis not present

## 2021-01-04 DIAGNOSIS — T380X5D Adverse effect of glucocorticoids and synthetic analogues, subsequent encounter: Secondary | ICD-10-CM | POA: Diagnosis not present

## 2021-01-04 DIAGNOSIS — M1711 Unilateral primary osteoarthritis, right knee: Secondary | ICD-10-CM | POA: Diagnosis not present

## 2021-01-04 DIAGNOSIS — Z9181 History of falling: Secondary | ICD-10-CM

## 2021-01-04 DIAGNOSIS — Z7901 Long term (current) use of anticoagulants: Secondary | ICD-10-CM | POA: Diagnosis not present

## 2021-01-04 DIAGNOSIS — H409 Unspecified glaucoma: Secondary | ICD-10-CM | POA: Diagnosis not present

## 2021-01-04 DIAGNOSIS — Z90710 Acquired absence of both cervix and uterus: Secondary | ICD-10-CM

## 2021-01-04 DIAGNOSIS — I10 Essential (primary) hypertension: Secondary | ICD-10-CM | POA: Diagnosis not present

## 2021-01-09 ENCOUNTER — Other Ambulatory Visit: Payer: Self-pay | Admitting: Internal Medicine

## 2021-01-09 DIAGNOSIS — U071 COVID-19: Secondary | ICD-10-CM | POA: Diagnosis not present

## 2021-01-09 DIAGNOSIS — E039 Hypothyroidism, unspecified: Secondary | ICD-10-CM | POA: Diagnosis not present

## 2021-01-09 DIAGNOSIS — J1282 Pneumonia due to coronavirus disease 2019: Secondary | ICD-10-CM | POA: Diagnosis not present

## 2021-01-09 DIAGNOSIS — R001 Bradycardia, unspecified: Secondary | ICD-10-CM | POA: Diagnosis not present

## 2021-01-09 DIAGNOSIS — I10 Essential (primary) hypertension: Secondary | ICD-10-CM | POA: Diagnosis not present

## 2021-01-09 DIAGNOSIS — J9601 Acute respiratory failure with hypoxia: Secondary | ICD-10-CM | POA: Diagnosis not present

## 2021-01-10 ENCOUNTER — Other Ambulatory Visit: Payer: Self-pay

## 2021-01-10 ENCOUNTER — Other Ambulatory Visit: Payer: Medicare Other

## 2021-01-10 ENCOUNTER — Ambulatory Visit (INDEPENDENT_AMBULATORY_CARE_PROVIDER_SITE_OTHER): Payer: Medicare Other

## 2021-01-10 DIAGNOSIS — I517 Cardiomegaly: Secondary | ICD-10-CM | POA: Diagnosis not present

## 2021-01-10 DIAGNOSIS — R918 Other nonspecific abnormal finding of lung field: Secondary | ICD-10-CM | POA: Diagnosis not present

## 2021-01-10 DIAGNOSIS — U071 COVID-19: Secondary | ICD-10-CM | POA: Diagnosis not present

## 2021-01-11 ENCOUNTER — Ambulatory Visit: Payer: Medicare Other | Admitting: Internal Medicine

## 2021-01-13 ENCOUNTER — Encounter: Payer: Medicare Other | Admitting: Internal Medicine

## 2021-01-13 ENCOUNTER — Telehealth: Payer: Self-pay

## 2021-01-13 NOTE — Telephone Encounter (Signed)
Pt is aware that she can stop the mucinex.

## 2021-01-13 NOTE — Telephone Encounter (Signed)
Pt called to get the results of her chest xray. Pt was informed of the results. Pt wanted to know how long she needed to take the Mucinex. She stated that she was put on this in the hospital and was told to continue on her discharge paper work.

## 2021-01-16 DIAGNOSIS — R001 Bradycardia, unspecified: Secondary | ICD-10-CM | POA: Diagnosis not present

## 2021-01-16 DIAGNOSIS — E039 Hypothyroidism, unspecified: Secondary | ICD-10-CM | POA: Diagnosis not present

## 2021-01-16 DIAGNOSIS — J9601 Acute respiratory failure with hypoxia: Secondary | ICD-10-CM | POA: Diagnosis not present

## 2021-01-16 DIAGNOSIS — U071 COVID-19: Secondary | ICD-10-CM | POA: Diagnosis not present

## 2021-01-16 DIAGNOSIS — I10 Essential (primary) hypertension: Secondary | ICD-10-CM | POA: Diagnosis not present

## 2021-01-16 DIAGNOSIS — J1282 Pneumonia due to coronavirus disease 2019: Secondary | ICD-10-CM | POA: Diagnosis not present

## 2021-01-23 DIAGNOSIS — I10 Essential (primary) hypertension: Secondary | ICD-10-CM | POA: Diagnosis not present

## 2021-01-23 DIAGNOSIS — E039 Hypothyroidism, unspecified: Secondary | ICD-10-CM | POA: Diagnosis not present

## 2021-01-23 DIAGNOSIS — J1282 Pneumonia due to coronavirus disease 2019: Secondary | ICD-10-CM | POA: Diagnosis not present

## 2021-01-23 DIAGNOSIS — J9601 Acute respiratory failure with hypoxia: Secondary | ICD-10-CM | POA: Diagnosis not present

## 2021-01-23 DIAGNOSIS — R001 Bradycardia, unspecified: Secondary | ICD-10-CM | POA: Diagnosis not present

## 2021-01-23 DIAGNOSIS — U071 COVID-19: Secondary | ICD-10-CM | POA: Diagnosis not present

## 2021-02-05 IMAGING — CT CT CHEST HIGH RESOLUTION WITHOUT CONTRAST
2 of 7 series · 15 of 36 positions shown, 18 images · non-contrast
Comparison: 05/22/2016 and 07/23/2008.

CLINICAL DATA: Interstitial lung disease. Shortness of breath with
exertion.

EXAM:
CT CHEST WITHOUT CONTRAST
TECHNIQUE: Multidetector CT imaging of the chest was performed following the
standard protocol without intravenous contrast. High resolution
imaging of the lungs, as well as inspiratory and expiratory imaging,
was performed.

[Series 5: coronal thorax · coronal · 0.57mm/px · 3 of 149 slices shown]
[im 30/149  lung]
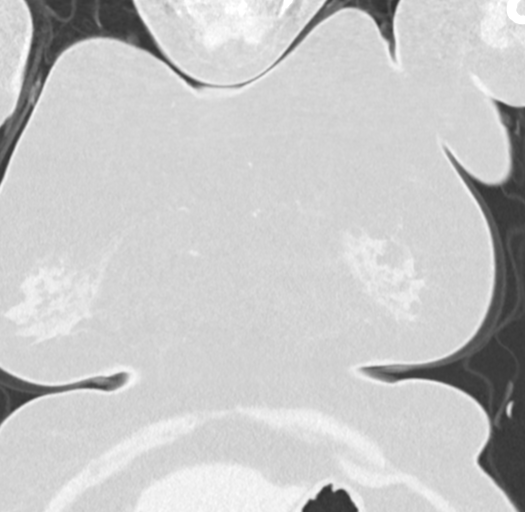
[im 60/149  lung]
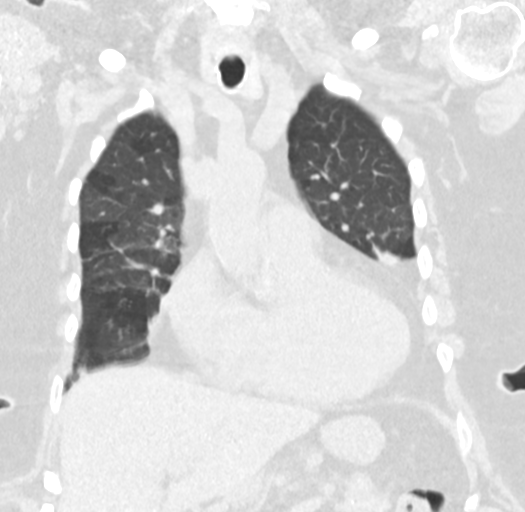
[im 89/149  lung]
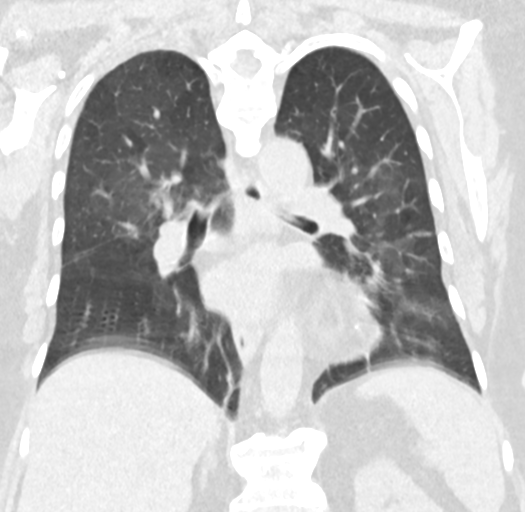

[Series 10: high res (id) thorax · axial · 0.58mm/px · z∈[-1112,-867]mm · 12 of 291 slices shown, 15 images]
[im 23/291  mediastinal]
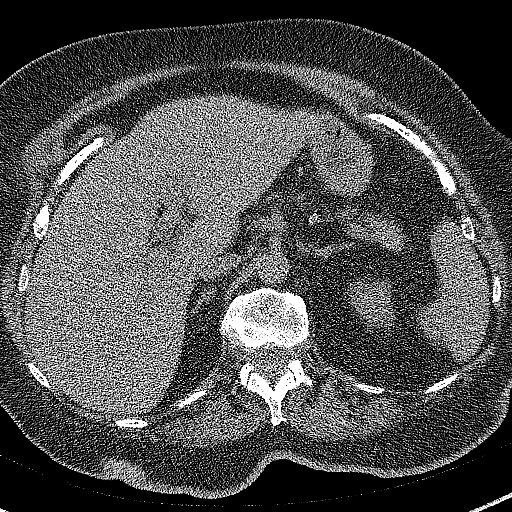
[im 23/291  lung]
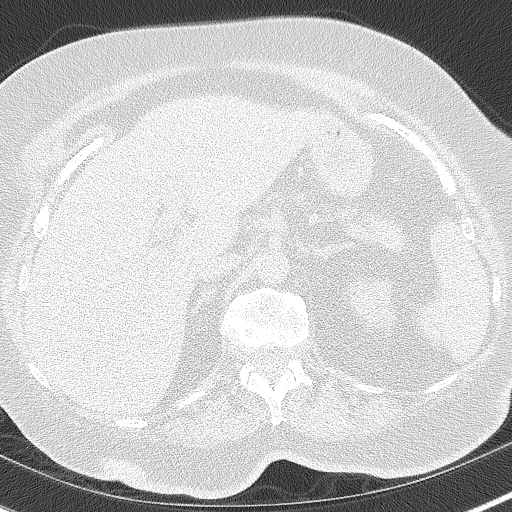
[im 45/291  lung]
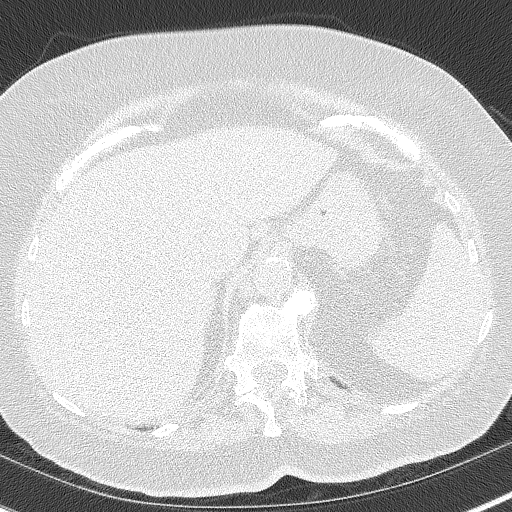
[im 67/291  lung]
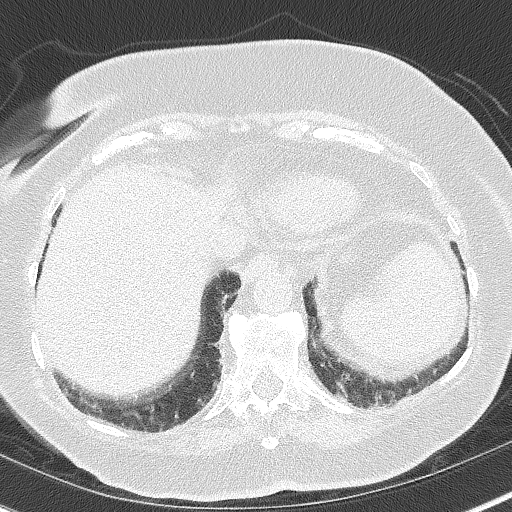
[im 90/291  lung]
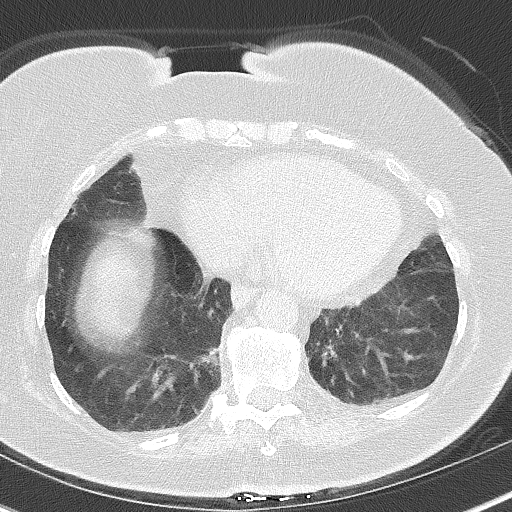
[im 112/291  mediastinal]
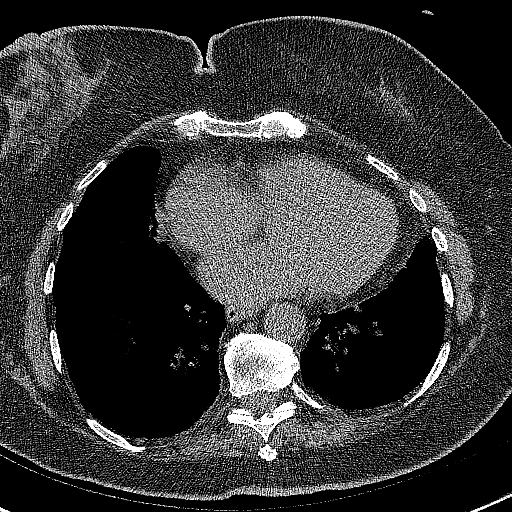
[im 112/291  lung]
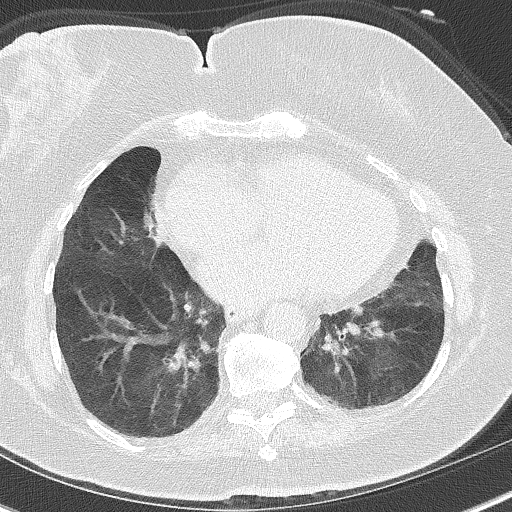
[im 134/291  lung]
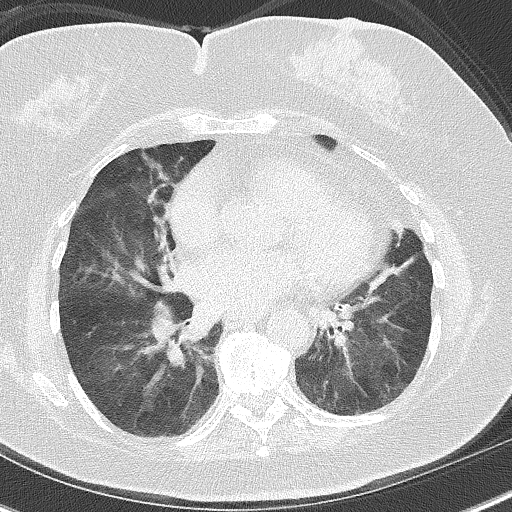
[im 157/291  lung]
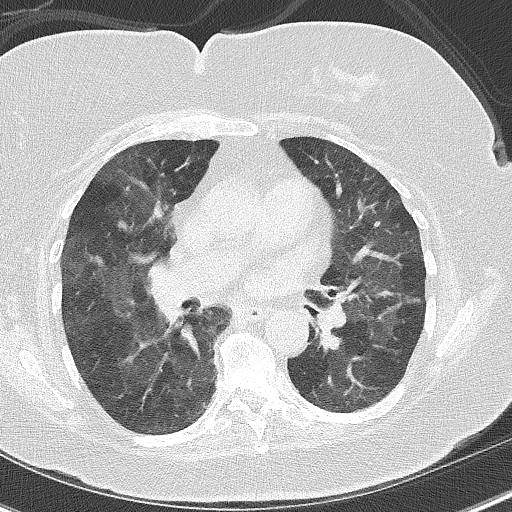
[im 179/291  lung]
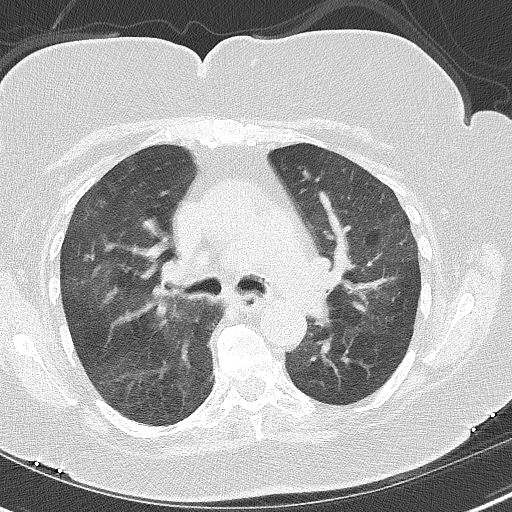
[im 201/291  mediastinal]
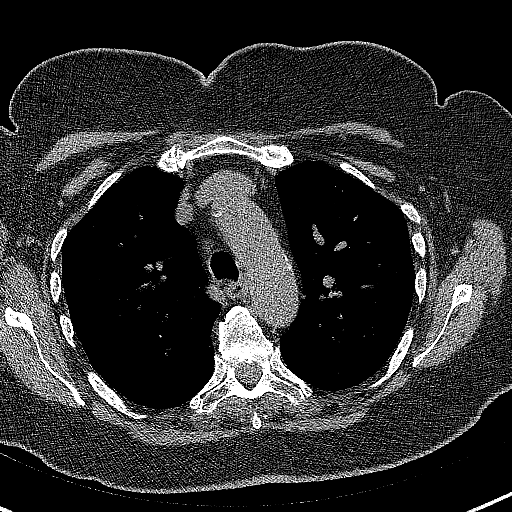
[im 201/291  lung]
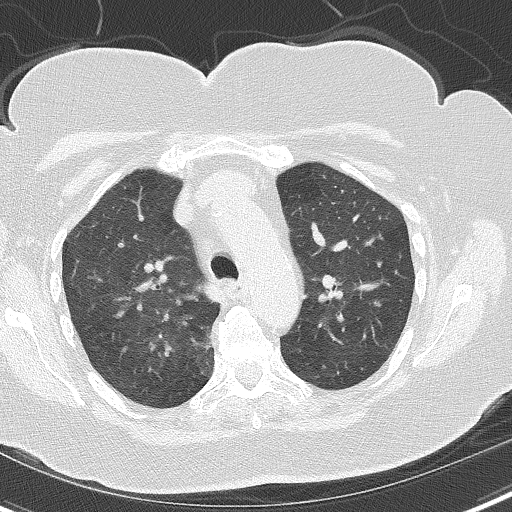
[im 224/291  lung]
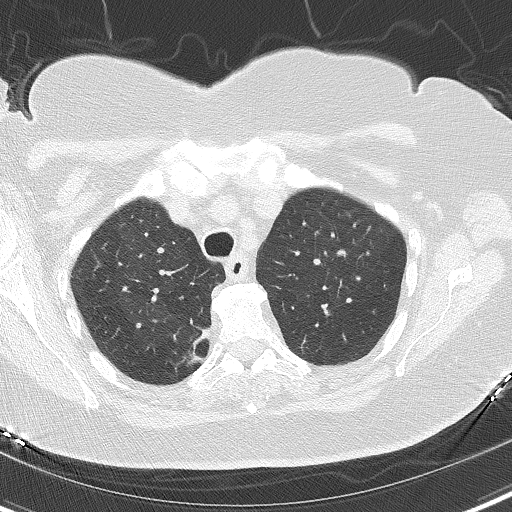
[im 246/291  lung]
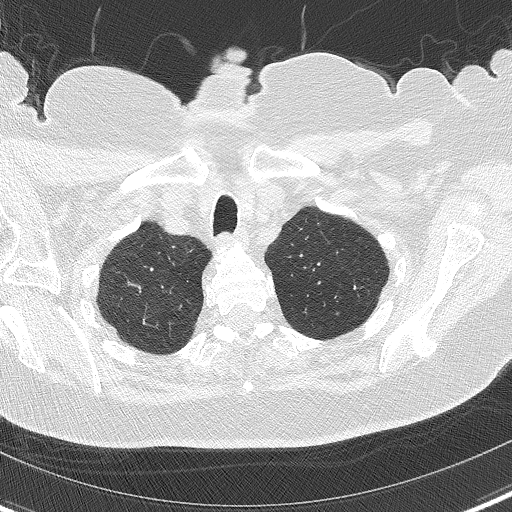
[im 268/291  lung]
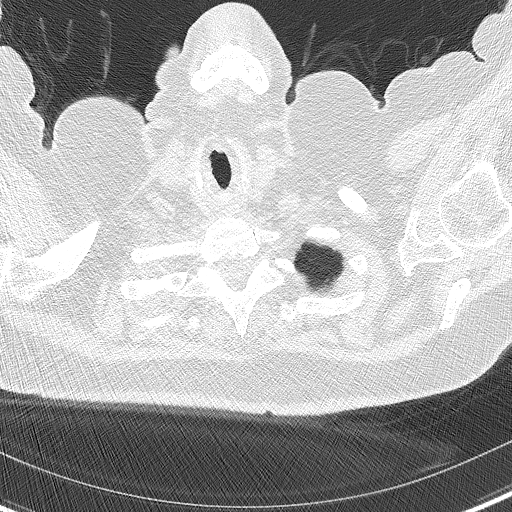

[15 of 36 positions shown; findings below may reference images not displayed]

FINDINGS: Cardiovascular: Atherosclerotic calcification of the aorta, aortic
valve and coronary arteries. Pulmonary arteries are enlarged, as is
the heart. No pericardial effusion.

Mediastinum/Nodes: Mediastinal lymph nodes measure up to 9 mm in the
low right paratracheal station, as before. Hilar regions are
difficult to definitively evaluate without IV contrast but appear
grossly unremarkable. No axillary adenopathy. Esophagus is grossly
unremarkable.

Lungs/Pleura: Image quality is markedly degraded by respiratory
motion. No definitive subpleural reticulation, traction
bronchiectasis/bronchiolectasis or honeycombing. Mosaic pulmonary
parenchymal attenuation. Assessment for air trapping is limited due
to respiratory motion. Subpleural lymph node along the left major
fissure, as before. Scarring in the right middle lobe and lingula.
Mild dependent atelectasis in the left lower lobe. No pleural fluid.
Airway is grossly unremarkable.

Upper Abdomen: Visualized portions of the liver, adrenal glands,
left kidney, spleen, pancreas and stomach are grossly unremarkable.
No upper abdominal adenopathy.

Musculoskeletal: Degenerative changes in the spine. No worrisome
lytic or sclerotic lesions.
IMPRESSION: 1. Image quality is markedly degraded by respiratory motion. No
definitive evidence of interstitial lung disease.
2. Mosaic pulmonary parenchymal attenuation, as before. Evaluation
for air trapping is limited due to respiratory motion.
3. Aortic atherosclerosis ([id number]-170.0). Coronary artery
calcification.
4. Enlarged pulmonary arteries, indicative of pulmonary arterial
hypertension.

## 2021-02-09 ENCOUNTER — Other Ambulatory Visit: Payer: Self-pay | Admitting: Internal Medicine

## 2021-02-16 ENCOUNTER — Ambulatory Visit: Payer: Medicare Other | Admitting: Internal Medicine

## 2021-02-24 ENCOUNTER — Other Ambulatory Visit: Payer: Self-pay | Admitting: Internal Medicine

## 2021-03-17 ENCOUNTER — Encounter: Payer: Self-pay | Admitting: Internal Medicine

## 2021-03-17 ENCOUNTER — Other Ambulatory Visit: Payer: Self-pay

## 2021-03-17 ENCOUNTER — Ambulatory Visit (INDEPENDENT_AMBULATORY_CARE_PROVIDER_SITE_OTHER): Payer: Medicare Other | Admitting: Internal Medicine

## 2021-03-17 VITALS — BP 112/66 | HR 61 | Temp 95.8°F | Ht 62.0 in | Wt 159.4 lb

## 2021-03-17 DIAGNOSIS — Z23 Encounter for immunization: Secondary | ICD-10-CM | POA: Diagnosis not present

## 2021-03-17 DIAGNOSIS — E0500 Thyrotoxicosis with diffuse goiter without thyrotoxic crisis or storm: Secondary | ICD-10-CM

## 2021-03-17 DIAGNOSIS — Z7901 Long term (current) use of anticoagulants: Secondary | ICD-10-CM

## 2021-03-17 DIAGNOSIS — E1159 Type 2 diabetes mellitus with other circulatory complications: Secondary | ICD-10-CM

## 2021-03-17 DIAGNOSIS — I152 Hypertension secondary to endocrine disorders: Secondary | ICD-10-CM

## 2021-03-17 DIAGNOSIS — E1169 Type 2 diabetes mellitus with other specified complication: Secondary | ICD-10-CM | POA: Diagnosis not present

## 2021-03-17 DIAGNOSIS — I2699 Other pulmonary embolism without acute cor pulmonale: Secondary | ICD-10-CM

## 2021-03-17 DIAGNOSIS — E669 Obesity, unspecified: Secondary | ICD-10-CM

## 2021-03-17 NOTE — Progress Notes (Signed)
Subjective:  Patient ID: Lauren Morris, female    DOB: 1931/07/24  Age: 87 y.o. MRN: [id number]  CC: The primary encounter diagnosis was Pulmonary embolism and infarction [facility]). 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  HPI Lauren Morris presents for  Chief Complaint  Patient presents with   Follow-up    3 month follow up on diabetes, hypertension   This visit occurred during the SARS-CoV-2 public health emergency.  Safety protocols were in place, including screening questions prior to the visit, additional usage of staff PPE, and extensive cleaning of exam room while observing appropriate contact time as indicated for disinfecting solutions.   3 month follow up on Type 2 DM, ,  Hypertension, hyperlipidemia and obesity.   Does not check BS.  Has reduced intake of sweets "but not completely. Not exercising due to age and respiratory conditions  Hypertension: patient checks blood pressure twice weekly at home.  Readings have been for the most part < 140/80 at rest . Patient is following a reduce salt diet most days and is taking medications as prescribed   History of PE/pneumonia due to COVID   :  still taking Eliquis since June.  A total of 6 months of therapy is planned, with CT angiogram planned       Review of Systems;  Patient denies headache, fevers, malaise, unintentional weight loss, skin rash, eye pain, sinus congestion and sinus pain, sore throat, dysphagia,  hemoptysis , cough, dyspnea, wheezing, chest pain, palpitations, orthopnea, edema, abdominal pain, nausea, melena, diarrhea, constipation, flank pain, dysuria, hematuria, urinary  Frequency, nocturia, numbness, tingling, seizures,  Focal weakness, Loss of consciousness,  Tremor, insomnia, depression, anxiety, and suicidal ideation.       Objective:  BP 112/66 (BP Location: Left Arm, Patient Position: Sitting, Cuff Size: Normal)   Pulse 61   Temp (!) 95.8 F (35.4 C) (Temporal)   Ht 5\' 2"  (1.575 m)   Wt 159 lb 6.4 oz (72.3 kg)   SpO2 95%   BMI 29.15 kg/m   BP Readings from Last 3 Encounters:  03/17/21 112/66  12/12/20 110/62  11/27/20 137/67    Wt Readings from Last 3 Encounters:  03/17/21 159 lb 6.4 oz (72.3 kg)  12/12/20 159 lb 12.8 oz (72.5 kg)  11/23/20 160 lb (72.6 kg)    General appearance: alert, cooperative and appears stated age Ears: normal TM's and external ear canals both ears Throat: lips, mucosa, and tongue normal; teeth and gums normal Neck: no adenopathy, no carotid bruit, supple, symmetrical, trachea midline and thyroid not enlarged, symmetric, no tenderness/mass/nodules Back: symmetric, no curvature. ROM normal. No CVA tenderness. Lungs: clear to auscultation bilaterally Heart: regular rate and rhythm, S1, S2 normal, no murmur, click, rub or gallop Abdomen: soft, non-tender; bowel sounds normal; no masses,  no organomegaly Pulses: 2+ and symmetric Skin: Skin color, texture, turgor normal. No rashes or lesions Lymph nodes: Cervical, supraclavicular, and axillary nodes normal.  Lab Results  Component Value Date   HGBA1C 6.8 (H) 12/12/2020   HGBA1C 5.8 08/11/2020   HGBA1C 5.8 01/14/2020    Lab Results  Component Value Date   CREATININE 0.80 12/12/2020   CREATININE 0.73 11/27/2020   CREATININE 0.65 11/26/2020    Lab Results  Component Value Date   WBC 10.4 12/12/2020   HGB 14.2 12/12/2020   HCT 43.0 12/12/2020  PLT 209.0 12/12/2020   GLUCOSE 100 (H) 12/12/2020   CHOL 156 01/14/2020   TRIG 101.0 01/14/2020   HDL 51.30 01/14/2020   LDLDIRECT 80.0 04/08/2017   LDLCALC 85 01/14/2020   ALT 71 (H) 12/12/2020   AST 53 (H) 12/12/2020   NA 135 12/12/2020   K 4.5 12/12/2020   CL 101 12/12/2020   CREATININE 0.80 12/12/2020   BUN 16 12/12/2020   CO2 24 12/12/2020   TSH  4.38 08/30/2020   HGBA1C 6.8 (H) 12/12/2020   MICROALBUR 2.2 (H) 08/11/2020    NM Pulmonary Perfusion  Result Date: 11/24/2020 CLINICAL DATA:  Shortness of breath.  COVID-19 positive EXAM: NUCLEAR MEDICINE PERFUSION LUNG SCAN TECHNIQUE: Perfusion images were obtained in multiple projections after intravenous injection of radiopharmaceutical. Ventilation scans intentionally deferred if perfusion scan and chest x-ray adequate for interpretation during COVID 19 epidemic. RADIOPHARMACEUTICALS:  4.0 mCi Tc-32m MAA IV COMPARISON:  11/23/2020 FINDINGS: Somewhat heterogeneous distribution of radiotracer within the bilateral lung fields. Multiple wedge-shaped perfusion defects of the peripheral aspect of both lung fields including bilateral apical and left lateral segmental defects. IMPRESSION: High probability for pulmonary embolism. Electronically Signed   By: Davina Poke D.O.   On: 11/24/2020 12:43   DG Chest Portable 1 View  Result Date: 11/23/2020 CLINICAL DATA:  Weakness.  Positive COVID test today EXAM: PORTABLE CHEST 1 VIEW COMPARISON:  06/13/2018 FINDINGS: Mild patchy airspace disease in the lung bases appears more prominent than on prior study. No pleural effusion. Negative for heart failure. IMPRESSION: Mild bibasilar airspace disease which could represent COVID pneumonia. Electronically Signed   By: Franchot Gallo M.D.   On: 11/23/2020 20:13    Assessment & Plan:   Problem List Items Addressed This Visit       Unprioritized   Obesity, diabetes, and hypertension syndrome ([place])     hemoglobin A1c has been repeatedly  <7.0 without pharmacologic intervention.  She was prescribed glipizide transiently for management of prednisone induced hyperglycemia during COVID infection /hospitalization.   Patient is  Up to date on eye exam and foot exam   Continue ARB for  microalbuminuria .  Lab Results  Component Value Date   HGBA1C 6.8 (H) 12/12/2020   Lab Results  Component Value Date    MICROALBUR 2.2 (H) 08/11/2020   Lab Results  Component Value Date   CREATININE 0.80 12/12/2020         Relevant Orders   Comprehensive metabolic panel   Hemoglobin A1c   Microalbumin / creatinine urine ratio   Pulmonary embolism and infarction [facility]) - Primary    Occurred in June 2022, Multiple emboli Suggested by V/Q scan.  Provoked by COVID 19 infection.  Will continue Elliquis for 6 months.  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  Repeat TSH is due.   Lab Results  Component Value Date   TSH 4.38 08/30/2020         Relevant Orders   TSH   Other Visit Diagnoses     Long term (current) use of anticoagulants       Relevant Orders   CBC with Differential/Platelet   Need for immunization against influenza       Relevant Orders   Flu Vaccine QUAD High Dose(Fluad) (Completed)       There are no discontinued medications.  Follow-up: Return in  about 6 months (around 09/14/2021).   [name], MD

## 2021-03-17 NOTE — Assessment & Plan Note (Addendum)
Managed with methimazole.  Repeat TSH is due.   Lab Results  Component Value Date   TSH 4.38 08/30/2020

## 2021-03-17 NOTE — Patient Instructions (Addendum)
I recommend trying melatonin for your insomnia.  It is not a sedative,  But must be taken on  a regular basis to help your internal clock.  Take every evening after dinner start with 3 mg dose   Max effective dose is 6 mg   We need to continue using oxygen at night because you have severe sleep apnea,  and low oxygen levels at night can cause hart failure and abnormal  heart rhythm  You will take a total of 6 months of Eliquis total (from June to December)  to treat the blood clot in your lungs   I think going to the beach would be good for you.  Salt air is good for the lungs  Going to any indoor event like a concert WILL most likely result in a COVID infection .

## 2021-03-19 NOTE — Assessment & Plan Note (Addendum)
hemoglobin A1c has been repeatedly  <7.0 without pharmacologic intervention.  She was prescribed glipizide transiently for management of prednisone induced hyperglycemia during COVID infection /hospitalization.   Patient is  Up to date on eye exam and foot exam   Continue ARB for  microalbuminuria .  Lab Results  Component Value Date   HGBA1C 6.8 (H) 12/12/2020   Lab Results  Component Value Date   MICROALBUR 2.2 (H) 08/11/2020   Lab Results  Component Value Date   CREATININE 0.80 12/12/2020

## 2021-03-19 NOTE — Assessment & Plan Note (Signed)
Occurred in June 2022, Multiple emboli Suggested by V/Q scan.  Provoked by COVID 19 infection.  Will continue Elliquis for 6 months.  CTA ordered for December

## 2021-04-03 ENCOUNTER — Other Ambulatory Visit: Payer: Self-pay | Admitting: Internal Medicine

## 2021-04-10 ENCOUNTER — Other Ambulatory Visit: Payer: Self-pay | Admitting: Internal Medicine

## 2021-04-17 ENCOUNTER — Other Ambulatory Visit: Payer: Self-pay | Admitting: Internal Medicine

## 2021-05-09 ENCOUNTER — Other Ambulatory Visit: Payer: Self-pay | Admitting: Internal Medicine

## 2021-05-11 ENCOUNTER — Other Ambulatory Visit: Payer: Self-pay | Admitting: Internal Medicine

## 2021-05-25 ENCOUNTER — Other Ambulatory Visit: Payer: Self-pay | Admitting: Internal Medicine

## 2021-05-25 ENCOUNTER — Other Ambulatory Visit (INDEPENDENT_AMBULATORY_CARE_PROVIDER_SITE_OTHER): Payer: Medicare Other

## 2021-05-25 ENCOUNTER — Other Ambulatory Visit: Payer: Self-pay

## 2021-05-25 DIAGNOSIS — E669 Obesity, unspecified: Secondary | ICD-10-CM | POA: Diagnosis not present

## 2021-05-25 DIAGNOSIS — I152 Hypertension secondary to endocrine disorders: Secondary | ICD-10-CM

## 2021-05-25 DIAGNOSIS — E0500 Thyrotoxicosis with diffuse goiter without thyrotoxic crisis or storm: Secondary | ICD-10-CM | POA: Diagnosis not present

## 2021-05-25 DIAGNOSIS — E1159 Type 2 diabetes mellitus with other circulatory complications: Secondary | ICD-10-CM

## 2021-05-25 DIAGNOSIS — E1169 Type 2 diabetes mellitus with other specified complication: Secondary | ICD-10-CM | POA: Diagnosis not present

## 2021-05-25 DIAGNOSIS — Z7901 Long term (current) use of anticoagulants: Secondary | ICD-10-CM | POA: Diagnosis not present

## 2021-05-25 LAB — COMPREHENSIVE METABOLIC PANEL
ALT: 63 U/L — ABNORMAL HIGH (ref 0–35)
AST: 57 U/L — ABNORMAL HIGH (ref 0–37)
Albumin: 3.7 g/dL (ref 3.5–5.2)
Alkaline Phosphatase: 520 U/L — ABNORMAL HIGH (ref 39–117)
BUN: 17 mg/dL (ref 6–23)
CO2: 30 mEq/L (ref 19–32)
Calcium: 9.4 mg/dL (ref 8.4–10.5)
Chloride: 102 mEq/L (ref 96–112)
Creatinine, Ser: 0.84 mg/dL (ref 0.40–1.20)
GFR: 62.7 mL/min (ref >60.00)
Glucose, Bld: 117 mg/dL — ABNORMAL HIGH (ref 70–99)
Potassium: 3.9 mEq/L (ref 3.5–5.1)
Sodium: 138 mEq/L (ref 135–145)
Total Bilirubin: 0.9 mg/dL (ref 0.2–1.2)
Total Protein: 7.6 g/dL (ref 6.0–8.3)

## 2021-05-25 LAB — CBC WITH DIFFERENTIAL/PLATELET
Basophils Absolute: 0.1 10*3/uL (ref 0.0–0.1)
Basophils Relative: 0.8 % (ref 0.0–3.0)
Eosinophils Absolute: 0.3 10*3/uL (ref 0.0–0.7)
Eosinophils Relative: 4.9 % (ref 0.0–5.0)
HCT: 43.2 % (ref 36.0–46.0)
Hemoglobin: 14.4 g/dL (ref 12.0–15.0)
Lymphocytes Relative: 35.4 % (ref 12.0–46.0)
Lymphs Abs: 2.3 10*3/uL (ref 0.7–4.0)
MCHC: 33.2 g/dL (ref 30.0–36.0)
MCV: 90 fl (ref 78.0–100.0)
Monocytes Absolute: 0.6 10*3/uL (ref 0.1–1.0)
Monocytes Relative: 9.1 % (ref 3.0–12.0)
Neutro Abs: 3.3 10*3/uL (ref 1.4–7.7)
Neutrophils Relative %: 49.8 % (ref 43.0–77.0)
Platelets: 235 10*3/uL (ref 150.0–400.0)
RBC: 4.8 Mil/uL (ref 3.87–5.11)
RDW: 14.2 % (ref 11.5–15.5)
WBC: 6.6 10*3/uL (ref 4.0–10.5)

## 2021-05-25 LAB — TSH: TSH: 5.55 u[IU]/mL — ABNORMAL HIGH (ref 0.35–5.50)

## 2021-05-25 LAB — HEMOGLOBIN A1C: Hgb A1c MFr Bld: 6.1 % (ref 4.6–6.5)

## 2021-05-25 LAB — MICROALBUMIN / CREATININE URINE RATIO
Creatinine,U: 55.1 mg/dL
Microalb Creat Ratio: 1.7 mg/g (ref 0.0–30.0)
Microalb, Ur: 0.9 mg/dL (ref 0.0–1.9)

## 2021-06-05 ENCOUNTER — Telehealth: Payer: Self-pay | Admitting: Internal Medicine

## 2021-06-05 NOTE — Telephone Encounter (Signed)
err

## 2021-06-22 ENCOUNTER — Telehealth: Payer: Self-pay | Admitting: Internal Medicine

## 2021-06-22 ENCOUNTER — Ambulatory Visit
Admission: RE | Admit: 2021-06-22 | Discharge: 2021-06-22 | Disposition: A | Discharge status: Home / Self Care | Payer: Medicare Other | Source: Ambulatory Visit | Attending: Internal Medicine | Admitting: Internal Medicine

## 2021-06-22 ENCOUNTER — Other Ambulatory Visit: Payer: Self-pay

## 2021-06-22 DIAGNOSIS — I700 Atherosclerosis of aorta: Secondary | ICD-10-CM | POA: Diagnosis not present

## 2021-06-22 DIAGNOSIS — Z86711 Personal history of pulmonary embolism: Secondary | ICD-10-CM | POA: Diagnosis not present

## 2021-06-22 DIAGNOSIS — R918 Other nonspecific abnormal finding of lung field: Secondary | ICD-10-CM | POA: Diagnosis not present

## 2021-06-22 DIAGNOSIS — I2699 Other pulmonary embolism without acute cor pulmonale: Secondary | ICD-10-CM | POA: Diagnosis not present

## 2021-06-22 MED ORDER — IOHEXOL 350 MG/ML SOLN
75.0000 mL | Freq: Once | INTRAVENOUS | Status: AC | PRN
  Administered 2021-06-22: 14:00:00 75 mL via INTRAVENOUS

## 2021-06-22 NOTE — Telephone Encounter (Signed)
Patient called in stated had some imaging done today, trying to find out results , Please call patient @ [phone]

## 2021-06-22 NOTE — Telephone Encounter (Signed)
Pt is requesting CT scan results.

## 2021-06-27 NOTE — Telephone Encounter (Signed)
Spoke with pt and informed her of her CT scan results. Pt gave a verbal understanding.  °

## 2021-06-27 NOTE — Telephone Encounter (Signed)
Patient called in today, requesting her CT results. Please call patient @ [phone]

## 2021-07-01 ENCOUNTER — Ambulatory Visit
Admission: EM | Admit: 2021-07-01 | Discharge: 2021-07-01 | Disposition: A | Discharge status: Home / Self Care | Payer: Medicare Other | Attending: Family Medicine | Admitting: Family Medicine

## 2021-07-01 ENCOUNTER — Encounter: Payer: Self-pay | Admitting: Emergency Medicine

## 2021-07-01 DIAGNOSIS — L209 Atopic dermatitis, unspecified: Secondary | ICD-10-CM | POA: Diagnosis not present

## 2021-07-01 MED ORDER — DEXAMETHASONE SODIUM PHOSPHATE 10 MG/ML IJ SOLN
10.0000 mg | Freq: Once | INTRAMUSCULAR | Status: AC
  Administered 2021-07-01: 10 mg via INTRAMUSCULAR

## 2021-07-01 NOTE — ED Provider Notes (Signed)
[name]    CSN: [id number] Arrival date & time: 07/01/21  1349      History   Chief Complaint Chief Complaint  Patient presents with   Rash    HPI SUELLA COGAR is a 88 y.o. female.   HPI Patient presents today with a itchy rash involving the entire back and lower hip area. Rash erupted x 1 day ago. She denies any changes in soaps or detergents. The rash is painless and only itches. Denies any recent illness. Endorses severely dry skin. The rash is not present on any other extremities. She has not applied any creams or taken any antihistamine since rash erupted. 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      Home Medications    Prior to Admission medications   Medication Sig Start Date End Date Taking? Authorizing Provider  Ascorbic Acid (VITAMIN C) 1000 MG tablet Take 1,000 mg by mouth daily.    [provider]  atenolol (TENORMIN) 25 MG tablet TAKE 1 TABLET BY MOUTH TWICE DAILY 05/11/21   [name], MD   Calcium Carb-Cholecalciferol (CALCIUM 1000 + D PO) Take 1 tablet by mouth daily.    [provider]  dorzolamide-timolol (COSOPT) 22.3-6.8 MG/ML ophthalmic solution Place 1 drop into both eyes 2 (two) times daily.    [provider]  ELIQUIS 5 MG TABS tablet TAKE 1 TABLET BY MOUTH 2 TIMES DAILY. 04/03/21   [name], MD  famotidine (PEPCID) 20 MG tablet TAKE 1 TABLET BY MOUTH 2 TIMES DAILY 05/10/21   [name], MD  fish oil-omega-3 fatty acids 1000 MG capsule Take 2 g by mouth daily.    [provider]  furosemide (LASIX) 20 MG tablet TAKE ONE TABLET BY MOUTH DAILY AS NEEDEDFOR FLUID RETENTION Patient taking differently: Take 20 mg by mouth daily. TAKE ONE TABLET BY MOUTH DAILY AS NEEDED FOR FLUID RETENTION 10/03/18   [name], MD  Multiple Vitamin (MULTIVITAMIN WITH MINERALS) TABS tablet Take 1 tablet by mouth daily. 11/27/20   Regalado, Cassie Freer, MD  SPIRIVA HANDIHALER 18 MCG inhalation capsule INHALE CONTENTS OF 1 CAPSULE AS DIRECTEDONCE A DAY VIA HANDIHALER 02/09/21   [name], MD  telmisartan (MICARDIS) 40 MG tablet TAKE 1 TABLET BY MOUTH AT BEDTIME 05/25/21   [name], MD  zinc sulfate 220 (50 Zn) MG capsule Take 1 capsule (220 mg total) by mouth daily. 11/27/20  Regalado, Cassie Freer, MD    Family History Family History  Problem Relation Age of Onset   Cancer Father    Hypertension Father    Heart attack Mother    Hypertension Mother     Social History Social History   Tobacco Use   Smoking status: Never   Smokeless tobacco: Never  Vaping Use   Vaping Use: Never used  Substance Use Topics   Alcohol use: No   Drug use: No     Allergies   Patient has no known allergies.   Review of Systems Review of Systems Pertinent negatives listed in HPI   Physical Exam Triage Vital Signs ED Triage Vitals  Enc Vitals Group     BP 07/01/21 1521 (!) 152/77     Pulse Rate 07/01/21 1521 (!) 58     Resp 07/01/21 1521 16      Temp 07/01/21 1521 98 F (36.7 C)     Temp Source 07/01/21 1521 Oral     SpO2 07/01/21 1521 95 %     Weight --      Height --      Head Circumference --      Peak Flow --      Pain Score 07/01/21 1530 0     Pain Loc --      Pain Edu? --      Excl. in [place]? --    No data found.  Updated Vital Signs BP (!) 152/77 (BP Location: Left Arm)    Pulse (!) 58    Temp 98 F (36.7 C) (Oral)    Resp 16    SpO2 95%   Visual Acuity Right Eye Distance:   Left Eye Distance:   Bilateral Distance:    Right Eye Near:   Left Eye Near:    Bilateral Near:     Physical Exam Vitals reviewed.  Constitutional:      Appearance: Normal appearance.  HENT:     Head: Normocephalic.  Cardiovascular:     Rate and Rhythm: Normal rate.  Pulmonary:     Effort: Pulmonary effort is normal.     Breath sounds: Normal breath sounds.  Musculoskeletal:     Cervical back: Normal range of motion.  Skin:         Comments: Macular rash with erythema  Neurological:     Mental Status: She is alert.  Psychiatric:        Attention and Perception: Attention and perception normal.        Mood and Affect: Mood and affect normal.        Speech: Speech normal.        Behavior: Behavior normal.        Thought Content: Thought content normal.        Cognition and Memory: Cognition normal.     UC Treatments / Results  Labs (all labs ordered are listed, but only abnormal results are displayed) Labs Reviewed - No data to display  EKG   Radiology No results found.  Procedures Procedures (including critical care time)  Medications Ordered in UC Medications  dexamethasone (DECADRON) injection 10 mg (10 mg Intramuscular Given 07/01/21 1559)    Initial Impression / Assessment and Plan / UC Course  I have reviewed the triage vital signs and the nursing notes.  Pertinent labs & imaging results that were available during my care of the patient were reviewed by me and considered in my medical decision making  (see chart for details).  Atopic dermatitis of unknown etiology  Decadron 10 mg IM  Continue famotidine (already prescribed). RTC if symptoms worsen or doesn't improve. Final Clinical Impressions(s) / UC Diagnoses   Final diagnoses:  Atopic dermatitis, unspecified type     Discharge Instructions      You are already prescribed famotidine by your primary care provider. Continue taking this medication as prescribed this will help resolve that rash in addition to the steroid injection you received.    ED Prescriptions   None    PDMP not reviewed this encounter.   Scot Jun, FNP 07/02/21 1905

## 2021-07-01 NOTE — ED Triage Notes (Signed)
Pt presents with an itchy rash around her waist and back that she noticed last night.

## 2021-07-01 NOTE — Discharge Instructions (Signed)
You are already prescribed famotidine by your primary care provider. Continue taking this medication as prescribed this will help resolve that rash in addition to the steroid injection you received.

## 2021-07-07 ENCOUNTER — Other Ambulatory Visit: Payer: Self-pay | Admitting: Internal Medicine

## 2021-07-12 DIAGNOSIS — E0500 Thyrotoxicosis with diffuse goiter without thyrotoxic crisis or storm: Secondary | ICD-10-CM | POA: Diagnosis not present

## 2021-07-12 DIAGNOSIS — R748 Abnormal levels of other serum enzymes: Secondary | ICD-10-CM | POA: Diagnosis not present

## 2021-07-25 ENCOUNTER — Other Ambulatory Visit: Payer: Self-pay | Admitting: Internal Medicine

## 2021-07-25 DIAGNOSIS — R748 Abnormal levels of other serum enzymes: Secondary | ICD-10-CM

## 2021-07-27 DIAGNOSIS — D3131 Benign neoplasm of right choroid: Secondary | ICD-10-CM | POA: Diagnosis not present

## 2021-07-27 DIAGNOSIS — H401131 Primary open-angle glaucoma, bilateral, mild stage: Secondary | ICD-10-CM | POA: Diagnosis not present

## 2021-07-27 DIAGNOSIS — H33321 Round hole, right eye: Secondary | ICD-10-CM | POA: Diagnosis not present

## 2021-07-27 DIAGNOSIS — H524 Presbyopia: Secondary | ICD-10-CM | POA: Diagnosis not present

## 2021-07-27 DIAGNOSIS — H35373 Puckering of macula, bilateral: Secondary | ICD-10-CM | POA: Diagnosis not present

## 2021-08-02 ENCOUNTER — Inpatient Hospital Stay
Admission: EM | Admit: 2021-08-02 | Discharge: 2021-08-08 | DRG: 149 | Disposition: A | Discharge status: Skilled Nursing Facility | Payer: Medicare Other | Attending: Internal Medicine | Admitting: Internal Medicine

## 2021-08-02 ENCOUNTER — Emergency Department: Payer: Medicare Other

## 2021-08-02 ENCOUNTER — Observation Stay: Payer: Medicare Other

## 2021-08-02 ENCOUNTER — Encounter: Payer: Self-pay | Admitting: Emergency Medicine

## 2021-08-02 ENCOUNTER — Other Ambulatory Visit: Payer: Self-pay

## 2021-08-02 DIAGNOSIS — M5136 Other intervertebral disc degeneration, lumbar region: Secondary | ICD-10-CM | POA: Diagnosis not present

## 2021-08-02 DIAGNOSIS — H8110 Benign paroxysmal vertigo, unspecified ear: Secondary | ICD-10-CM | POA: Diagnosis not present

## 2021-08-02 DIAGNOSIS — S0003XA Contusion of scalp, initial encounter: Secondary | ICD-10-CM

## 2021-08-02 DIAGNOSIS — J449 Chronic obstructive pulmonary disease, unspecified: Secondary | ICD-10-CM | POA: Diagnosis present

## 2021-08-02 DIAGNOSIS — S3991XA Unspecified injury of abdomen, initial encounter: Secondary | ICD-10-CM | POA: Diagnosis not present

## 2021-08-02 DIAGNOSIS — R001 Bradycardia, unspecified: Secondary | ICD-10-CM | POA: Diagnosis present

## 2021-08-02 DIAGNOSIS — Z7901 Long term (current) use of anticoagulants: Secondary | ICD-10-CM

## 2021-08-02 DIAGNOSIS — I6523 Occlusion and stenosis of bilateral carotid arteries: Secondary | ICD-10-CM | POA: Diagnosis not present

## 2021-08-02 DIAGNOSIS — R531 Weakness: Secondary | ICD-10-CM

## 2021-08-02 DIAGNOSIS — Z20822 Contact with and (suspected) exposure to covid-19: Secondary | ICD-10-CM | POA: Diagnosis present

## 2021-08-02 DIAGNOSIS — R2689 Other abnormalities of gait and mobility: Secondary | ICD-10-CM | POA: Diagnosis not present

## 2021-08-02 DIAGNOSIS — S3993XA Unspecified injury of pelvis, initial encounter: Secondary | ICD-10-CM | POA: Diagnosis not present

## 2021-08-02 DIAGNOSIS — K7460 Unspecified cirrhosis of liver: Secondary | ICD-10-CM | POA: Diagnosis present

## 2021-08-02 DIAGNOSIS — D320 Benign neoplasm of cerebral meninges: Secondary | ICD-10-CM | POA: Diagnosis present

## 2021-08-02 DIAGNOSIS — J42 Unspecified chronic bronchitis: Secondary | ICD-10-CM | POA: Diagnosis present

## 2021-08-02 DIAGNOSIS — S060XAD Concussion with loss of consciousness status unknown, subsequent encounter: Secondary | ICD-10-CM | POA: Diagnosis not present

## 2021-08-02 DIAGNOSIS — M545 Low back pain, unspecified: Secondary | ICD-10-CM | POA: Diagnosis not present

## 2021-08-02 DIAGNOSIS — Z66 Do not resuscitate: Secondary | ICD-10-CM | POA: Diagnosis present

## 2021-08-02 DIAGNOSIS — Z79899 Other long term (current) drug therapy: Secondary | ICD-10-CM

## 2021-08-02 DIAGNOSIS — I629 Nontraumatic intracranial hemorrhage, unspecified: Secondary | ICD-10-CM | POA: Diagnosis not present

## 2021-08-02 DIAGNOSIS — G4730 Sleep apnea, unspecified: Secondary | ICD-10-CM | POA: Diagnosis present

## 2021-08-02 DIAGNOSIS — G319 Degenerative disease of nervous system, unspecified: Secondary | ICD-10-CM | POA: Diagnosis not present

## 2021-08-02 DIAGNOSIS — D329 Benign neoplasm of meninges, unspecified: Secondary | ICD-10-CM | POA: Diagnosis present

## 2021-08-02 DIAGNOSIS — M549 Dorsalgia, unspecified: Secondary | ICD-10-CM | POA: Diagnosis not present

## 2021-08-02 DIAGNOSIS — R0602 Shortness of breath: Secondary | ICD-10-CM

## 2021-08-02 DIAGNOSIS — I872 Venous insufficiency (chronic) (peripheral): Secondary | ICD-10-CM | POA: Diagnosis not present

## 2021-08-02 DIAGNOSIS — M4312 Spondylolisthesis, cervical region: Secondary | ICD-10-CM | POA: Diagnosis not present

## 2021-08-02 DIAGNOSIS — R42 Dizziness and giddiness: Secondary | ICD-10-CM | POA: Diagnosis not present

## 2021-08-02 DIAGNOSIS — I672 Cerebral atherosclerosis: Secondary | ICD-10-CM | POA: Diagnosis not present

## 2021-08-02 DIAGNOSIS — I517 Cardiomegaly: Secondary | ICD-10-CM | POA: Diagnosis not present

## 2021-08-02 DIAGNOSIS — R2681 Unsteadiness on feet: Secondary | ICD-10-CM | POA: Diagnosis not present

## 2021-08-02 DIAGNOSIS — Z86711 Personal history of pulmonary embolism: Secondary | ICD-10-CM | POA: Diagnosis not present

## 2021-08-02 DIAGNOSIS — Z8249 Family history of ischemic heart disease and other diseases of the circulatory system: Secondary | ICD-10-CM | POA: Diagnosis not present

## 2021-08-02 DIAGNOSIS — I6782 Cerebral ischemia: Secondary | ICD-10-CM | POA: Diagnosis not present

## 2021-08-02 DIAGNOSIS — R079 Chest pain, unspecified: Secondary | ICD-10-CM | POA: Diagnosis not present

## 2021-08-02 DIAGNOSIS — H409 Unspecified glaucoma: Secondary | ICD-10-CM | POA: Diagnosis present

## 2021-08-02 DIAGNOSIS — R0609 Other forms of dyspnea: Secondary | ICD-10-CM | POA: Diagnosis not present

## 2021-08-02 DIAGNOSIS — Z7189 Other specified counseling: Secondary | ICD-10-CM

## 2021-08-02 DIAGNOSIS — Z743 Need for continuous supervision: Secondary | ICD-10-CM | POA: Diagnosis not present

## 2021-08-02 DIAGNOSIS — E039 Hypothyroidism, unspecified: Secondary | ICD-10-CM | POA: Diagnosis present

## 2021-08-02 DIAGNOSIS — W19XXXA Unspecified fall, initial encounter: Secondary | ICD-10-CM

## 2021-08-02 DIAGNOSIS — Z741 Need for assistance with personal care: Secondary | ICD-10-CM | POA: Diagnosis not present

## 2021-08-02 DIAGNOSIS — K760 Fatty (change of) liver, not elsewhere classified: Secondary | ICD-10-CM | POA: Diagnosis present

## 2021-08-02 DIAGNOSIS — R278 Other lack of coordination: Secondary | ICD-10-CM | POA: Diagnosis not present

## 2021-08-02 DIAGNOSIS — S0990XA Unspecified injury of head, initial encounter: Secondary | ICD-10-CM | POA: Diagnosis not present

## 2021-08-02 DIAGNOSIS — I10 Essential (primary) hypertension: Secondary | ICD-10-CM | POA: Diagnosis present

## 2021-08-02 DIAGNOSIS — M2578 Osteophyte, vertebrae: Secondary | ICD-10-CM | POA: Diagnosis not present

## 2021-08-02 DIAGNOSIS — M546 Pain in thoracic spine: Secondary | ICD-10-CM | POA: Diagnosis not present

## 2021-08-02 DIAGNOSIS — K219 Gastro-esophageal reflux disease without esophagitis: Secondary | ICD-10-CM | POA: Diagnosis not present

## 2021-08-02 DIAGNOSIS — R0781 Pleurodynia: Secondary | ICD-10-CM | POA: Diagnosis not present

## 2021-08-02 DIAGNOSIS — Z8616 Personal history of COVID-19: Secondary | ICD-10-CM

## 2021-08-02 DIAGNOSIS — M6281 Muscle weakness (generalized): Secondary | ICD-10-CM | POA: Diagnosis not present

## 2021-08-02 DIAGNOSIS — R7401 Elevation of levels of liver transaminase levels: Secondary | ICD-10-CM | POA: Diagnosis not present

## 2021-08-02 LAB — PHOSPHORUS: Phosphorus: 3.5 mg/dL (ref 2.5–4.6)

## 2021-08-02 LAB — COMPREHENSIVE METABOLIC PANEL
ALT: 66 U/L — ABNORMAL HIGH (ref 0–44)
AST: 81 U/L — ABNORMAL HIGH (ref 15–41)
Albumin: 3.5 g/dL (ref 3.5–5.0)
Alkaline Phosphatase: 531 U/L — ABNORMAL HIGH (ref 38–126)
Anion gap: 10 (ref 5–15)
BUN: 17 mg/dL (ref 8–23)
CO2: 24 mmol/L (ref 22–32)
Calcium: 8.9 mg/dL (ref 8.9–10.3)
Chloride: 103 mmol/L (ref 98–111)
Creatinine, Ser: 0.89 mg/dL (ref 0.44–1.00)
GFR, Estimated: >60 mL/min (ref >60)
Glucose, Bld: 160 mg/dL — ABNORMAL HIGH (ref 70–99)
Potassium: 4.5 mmol/L (ref 3.5–5.1)
Sodium: 137 mmol/L (ref 135–145)
Total Bilirubin: 1.3 mg/dL — ABNORMAL HIGH (ref 0.3–1.2)
Total Protein: 7.6 g/dL (ref 6.5–8.1)

## 2021-08-02 LAB — CBC WITH DIFFERENTIAL/PLATELET
Abs Immature Granulocytes: 0.03 10*3/uL (ref 0.00–0.07)
Basophils Absolute: 0.1 10*3/uL (ref 0.0–0.1)
Basophils Relative: 1 %
Eosinophils Absolute: 0.2 10*3/uL (ref 0.0–0.5)
Eosinophils Relative: 2 %
HCT: 46.1 % — ABNORMAL HIGH (ref 36.0–46.0)
Hemoglobin: 14.9 g/dL (ref 12.0–15.0)
Immature Granulocytes: 0 %
Lymphocytes Relative: 29 %
Lymphs Abs: 2.7 10*3/uL (ref 0.7–4.0)
MCH: 29.6 pg (ref 26.0–34.0)
MCHC: 32.3 g/dL (ref 30.0–36.0)
MCV: 91.5 fL (ref 80.0–100.0)
Monocytes Absolute: 0.7 10*3/uL (ref 0.1–1.0)
Monocytes Relative: 7 %
Neutro Abs: 5.5 10*3/uL (ref 1.7–7.7)
Neutrophils Relative %: 61 %
Platelets: 250 10*3/uL (ref 150–400)
RBC: 5.04 MIL/uL (ref 3.87–5.11)
RDW: 13.5 % (ref 11.5–15.5)
WBC: 9.1 10*3/uL (ref 4.0–10.5)
nRBC: 0 % (ref 0.0–0.2)

## 2021-08-02 LAB — TROPONIN I (HIGH SENSITIVITY)
Troponin I (High Sensitivity): 11 ng/L (ref <18)
Troponin I (High Sensitivity): 11 ng/L (ref <18)

## 2021-08-02 LAB — RESP PANEL BY RT-PCR (FLU A&B, COVID) ARPGX2
Influenza A by PCR: NEGATIVE
Influenza B by PCR: NEGATIVE
SARS Coronavirus 2 by RT PCR: NEGATIVE

## 2021-08-02 LAB — MAGNESIUM: Magnesium: 2.1 mg/dL (ref 1.7–2.4)

## 2021-08-02 MED ORDER — HYDROCODONE-ACETAMINOPHEN 5-325 MG PO TABS
1.0000 | ORAL_TABLET | ORAL | Status: AC
  Administered 2021-08-02: 1 via ORAL
  Filled 2021-08-02: qty 1

## 2021-08-02 MED ORDER — ACETAMINOPHEN 500 MG PO TABS
1000.0000 mg | ORAL_TABLET | Freq: Four times a day (QID) | ORAL | Status: AC | PRN
  Administered 2021-08-02 – 2021-08-04 (×2): 1000 mg via ORAL
  Filled 2021-08-02 (×2): qty 2

## 2021-08-02 MED ORDER — ENOXAPARIN SODIUM 40 MG/0.4ML IJ SOSY
40.0000 mg | PREFILLED_SYRINGE | INTRAMUSCULAR | Status: DC
  Administered 2021-08-02 – 2021-08-07 (×6): 40 mg via SUBCUTANEOUS
  Filled 2021-08-02 (×6): qty 0.4

## 2021-08-02 MED ORDER — ACETAMINOPHEN 650 MG RE SUPP: 650.0000 mg | Freq: Four times a day (QID) | RECTAL | Status: AC | PRN

## 2021-08-02 MED ORDER — IOHEXOL 350 MG/ML SOLN
75.0000 mL | Freq: Once | INTRAVENOUS | Status: AC | PRN
  Administered 2021-08-02: 75 mL via INTRAVENOUS
  Filled 2021-08-02: qty 75

## 2021-08-02 MED ORDER — ONDANSETRON HCL 4 MG PO TABS: 4.0000 mg | ORAL_TABLET | Freq: Four times a day (QID) | ORAL | Status: DC | PRN

## 2021-08-02 MED ORDER — ONDANSETRON HCL 4 MG/2ML IJ SOLN: 4.0000 mg | Freq: Four times a day (QID) | INTRAMUSCULAR | Status: DC | PRN

## 2021-08-02 MED ORDER — IOHEXOL 300 MG/ML  SOLN
50.0000 mL | Freq: Once | INTRAMUSCULAR | Status: AC | PRN
  Administered 2021-08-02: 50 mL via INTRAVENOUS
  Filled 2021-08-02: qty 50

## 2021-08-02 MED ORDER — DORZOLAMIDE HCL-TIMOLOL MAL 2-0.5 % OP SOLN
1.0000 [drp] | Freq: Two times a day (BID) | OPHTHALMIC | Status: DC
  Administered 2021-08-03 – 2021-08-08 (×12): 1 [drp] via OPHTHALMIC
  Filled 2021-08-02: qty 10

## 2021-08-02 MED ORDER — HYDROCODONE-ACETAMINOPHEN 5-325 MG PO TABS
1.0000 | ORAL_TABLET | Freq: Four times a day (QID) | ORAL | Status: AC | PRN
  Administered 2021-08-03 (×3): 1 via ORAL
  Filled 2021-08-02 (×3): qty 1

## 2021-08-02 MED ORDER — MORPHINE SULFATE (PF) 2 MG/ML IV SOLN
0.5000 mg | INTRAVENOUS | Status: AC | PRN
  Administered 2021-08-02 – 2021-08-03 (×2): 0.5 mg via INTRAVENOUS
  Filled 2021-08-02 (×2): qty 1

## 2021-08-02 MED ORDER — HYDRALAZINE HCL 10 MG PO TABS
10.0000 mg | ORAL_TABLET | Freq: Four times a day (QID) | ORAL | Status: AC | PRN
  Filled 2021-08-02: qty 1

## 2021-08-02 NOTE — ED Notes (Signed)
Pt taken to ct via stretcher.

## 2021-08-02 NOTE — ED Provider Notes (Signed)
[facility] Provider Note    Event Date/Time   First MD Initiated Contact with Patient 08/02/21 1336     (approximate)   History   Fall   HPI  Lauren Morris is a 88 y.o. female with a past medical history of obesity, HTN, acquired hypothyroidism, OSA and PE having completed her course of Eliquis and stopped it about a month ago who presents for evaluation after a fall that occurred earlier today.  Patient states he does not remember exactly what happened earlier that she was stepping over a curb when she fell backwards and hit the back of her head.  She is not sure if she had any LOC.  She states in addition to headache she has pain in the back right side of her chest.  She states she started feel little short of breath after the fall but did not remember feeling short of breath beforehand.  She does not have any midline neck or back pain, left-sided posterior or anterior chest pain or any extremity pain.  No other recent falls or injuries.  She does not recall any recent fevers, chills, cough, nausea, vomiting, diarrhea, rash or any other recent injuries or falls.  She typically is able to ambulate without any difficulty and lives independently.  No other acute concerns at this time.  No medication changes.      Physical Exam  Triage Vital Signs: ED Triage Vitals [08/02/21 1319]  Enc Vitals Group     BP      Pulse      Resp      Temp      Temp src      SpO2      Weight 159 lb 6.3 oz (72.3 kg)     Height 5\' 2"  (1.575 m)     Head Circumference      Peak Flow      Pain Score 5     Pain Loc      Pain Edu?      Excl. in [place]?     Most recent vital signs: Vitals:   08/02/21 1429  BP: (!) 168/70  Pulse: (!) 52  Resp: 16  Temp: (!) 97.5 F (36.4 C)  SpO2: 98%    General: Awake, appears mildly uncomfortable. CV:  Good peripheral perfusion.  2+ radial pulses.  No significant murmurs. Resp:  Normal effort.  Clear bilaterally without any evidence of  tachypnea or respiratory distress. Abd:  No distention.  Throughout. Other:  Cranials 2 through 12 are grossly intact.  Patient has symmetric strength in her bilateral upper and lower extremities.  Sensation is intact to light touch throughout.  There is no tenderness step-offs or deformities over the C/T/L-spine.  There is some tenderness diffusely over the right-sided thoracic muscles without overlying skin changes.  There is palpable hematoma over the occiput.  No other obvious trauma to the face scalp head or neck or torso.   ED Results / Procedures / Treatments  Labs (all labs ordered are listed, but only abnormal results are displayed) Labs Reviewed  CBC WITH DIFFERENTIAL/PLATELET - Abnormal; Notable for the following components:      Result Value   HCT 46.1 (*)    All other components within normal limits  RESP PANEL BY RT-PCR (FLU A&B, COVID) ARPGX2  URINALYSIS, COMPLETE (UACMP) WITH MICROSCOPIC  COMPREHENSIVE METABOLIC PANEL  TROPONIN I (HIGH SENSITIVITY)     EKG  EKG is remarkable sinus bradycardia with a  ventricular rate of 52 with nonspecific ST change in inferior leads III and aVF without other clear evidence of acute ischemia or significant arrhythmia.    RADIOLOGY CT head and C-spine interpreted by myself shows no evidence of a skull fracture, intracranial hemorrhage or acute C-spine fracture or traumatic listhesis.  I also reviewed radiology's findings and agree with their interpretation of some atrophy and chronic microvascular ischemic changes and evidence of contusion in the posterior scalp.  There is also notation of some degenerative changes in the C-spine.  Chest x-ray interpreted by myself shows cardiomegaly without overt edema, focal consolidation, pneumothorax, displaced rib fracture or other clear acute process.  I also reviewed radiology interpretation and agree with the findings of aortic atherosclerosis.   PROCEDURES:  Critical Care performed:  No  Procedures    MEDICATIONS ORDERED IN ED: Medications  HYDROcodone-acetaminophen (NORCO/VICODIN) 5-325 MG per tablet 1 tablet (1 tablet Oral Given 08/02/21 1503)     IMPRESSION / MDM / ASSESSMENT AND PLAN / ED COURSE  I reviewed the triage vital signs and the nursing notes.                              Differential diagnosis includes, but is not limited to fall secondary to mechanical etiology versus possible arrhythmia, anemia, metabolic derangements, cardiomyopathy, PE orthostasis and dehydration.  With regard to possible injuries patient is evidence of a posterior hematoma on exam and a contusion to the posterior right chest and concerned about possible skull fracture, intracranial hemorrhage possible rib fracture and lung contusion.  EKG is remarkable sinus bradycardia with a ventricular rate of 52 with nonspecific ST change in inferior leads III and aVF without other clear evidence of acute ischemia or significant arrhythmia.   CT head and C-spine interpreted by myself shows no evidence of a skull fracture, intracranial hemorrhage or acute C-spine fracture or traumatic listhesis.  I also reviewed radiology's findings and agree with their interpretation of some atrophy and chronic microvascular ischemic changes and evidence of contusion in the posterior scalp.  There is also notation of some degenerative changes in the C-spine.  Chest x-ray interpreted by myself shows cardiomegaly without overt edema, focal consolidation, pneumothorax, displaced rib fracture or other clear acute process.  I also reviewed radiology interpretation and agree with the findings of aortic atherosclerosis.  CBC shows no leukocytosis or acute anemia.  Given patient reports new shortness of breath today which could possibly related to a nondisplaced rib fracture given she has history of PE and is not currently anticoagulated obtain a CTA chest to rule this out.  Also obtain basic labs including a CMP and a  troponin to assess for any evidence of cardiac ischemia.  Will order UA to assess for possible contributing cystitis and a COVID influenza swab given her overall weakness.  Patient signed over to attending provider approximately 2:30 PM.  Plan is to follow-up for labs imaging and reassess.       FINAL CLINICAL IMPRESSION(S) / ED DIAGNOSES   Final diagnoses:  Fall, initial encounter  Hematoma of scalp, initial encounter  Acute right-sided thoracic back pain  SOB (shortness of breath)  Weakness     Rx / DC Orders   ED Discharge Orders     None        Note:  This document was prepared using Dragon voice recognition software and may include unintentional dictation errors.   Lucrezia Starch, MD 08/02/21 [phone]

## 2021-08-02 NOTE — Plan of Care (Signed)
  Problem: Health Behavior/Discharge Planning: Goal: Ability to manage health-related needs will improve Outcome: Progressing   

## 2021-08-02 NOTE — ED Triage Notes (Addendum)
First Nurse Note:  Arrives via [facility] from [facility].  Fall while walking back to the car. No LOC. No N/V.  AAOx3.  Skin warm and dry.  VS wnl.  C/O pain to right posterior head and right rib pain.

## 2021-08-02 NOTE — Hospital Course (Addendum)
88 year old female with history of PE, blood completed course of anticoagulation, chronic LFTs, currently being followed by outpatient GI, generalized weakness, obesity, non-insulin-dependent diabetes mellitus, hypothyroid, hypertension, chronic bronchitis, who presents emergency department from home for chief concerns of weakness and vertigo.  Vitals in the emergency department show temperature of 97.5, respiration rate of 68, heart rate of 52, blood pressure 168/70, SPO2 of 98% on room air.  Labs in the emergency department showed serum sodium 137, potassium 4.5, chloride 103, bicarb 24, BUN of 17, serum creatinine of 0.89, nonfasting blood glucose 160, GFR greater than 60, AST 81, ALT 66.  WBC was 9.1, hemoglobin 14.9, platelets of 250.  COVID/influenza A/influenza B PCR were negative.  Troponin high-sensitivity was 11x2.

## 2021-08-02 NOTE — ED Provider Notes (Signed)
Patient me at 3 PM by Dr. Tamala Julian.  In brief she is an 88 year old female history of a PE not anticoagulated, hypertension, hypothyroidism who presents after a fall.  Did not exactly remember what happened but she stepped over a curb and then fell backwards hitting her head.  Unsure if loss of consciousness.  Patient complaining of significant flank/chest pain after the injury so patient is pending a CT angio of the chest to both make sure she has not had a PE as the cause of the fall and to rule out any intrathoracic injury.  Patient also feeling generally weak so has labs and UA as well.  His labs are reassuring.  LFTs are elevated table from 2 months ago.  CBC with normal hemoglobin no leukocytosis.  Serial troponins are negative.  COVID and influenza testing are negative.  Patient CT head does not have any acute findings.  CT angio of the chest also reassuring.  On reassessment patient complaining of significant vertiginous-like symptoms, unable to stand or walk.  Even sitting up in bed she is very symptomatic.  On neurologic exam she is intact, no concerning nystagmus no dysmetria.  Unclear if this is due to a concussion or part of the primary reason why she fell.  Patient also complaining of still significant back and flank pain.  At this point do not think she is safe to go home because she cannot ambulate.  We will obtain a CT of the abdomen to rule out intra-abdominal injury as well as CT of the thoracic and lumbar spine.  Ultimately patient may need to have an MRI of her brain to rule out posterior stroke.   CTs of the abdomen and pelvis lumbar spine and thoracic spine are all within normal limits.  There is mention of mild anterior subluxation of L5 on S1 which is likely chronic but could represent ligamentous injury.  Patient is not tender here in the mechanism of injury really does not seem consistent with a ligamentous injury additionally patient has no neurologic symptoms in her lower extremities.   I suspect that this is a chronic finding.  Given patient cannot ambulate will admit to the medicine service.   Rada Hay, MD 08/02/21 2037

## 2021-08-02 NOTE — ED Notes (Signed)
See triage note  presents s/p fall   was walking missed step  fell backward  hitting here head and back  no LOC  states she is weak  family at bedside

## 2021-08-03 ENCOUNTER — Encounter: Payer: Self-pay | Admitting: Internal Medicine

## 2021-08-03 ENCOUNTER — Observation Stay: Payer: Medicare Other

## 2021-08-03 DIAGNOSIS — S0003XA Contusion of scalp, initial encounter: Secondary | ICD-10-CM | POA: Diagnosis not present

## 2021-08-03 DIAGNOSIS — Z743 Need for continuous supervision: Secondary | ICD-10-CM | POA: Diagnosis not present

## 2021-08-03 DIAGNOSIS — D329 Benign neoplasm of meninges, unspecified: Secondary | ICD-10-CM | POA: Diagnosis not present

## 2021-08-03 DIAGNOSIS — I10 Essential (primary) hypertension: Secondary | ICD-10-CM | POA: Diagnosis present

## 2021-08-03 DIAGNOSIS — R42 Dizziness and giddiness: Secondary | ICD-10-CM

## 2021-08-03 DIAGNOSIS — Z8616 Personal history of COVID-19: Secondary | ICD-10-CM | POA: Diagnosis not present

## 2021-08-03 DIAGNOSIS — I672 Cerebral atherosclerosis: Secondary | ICD-10-CM | POA: Diagnosis not present

## 2021-08-03 DIAGNOSIS — Z20822 Contact with and (suspected) exposure to covid-19: Secondary | ICD-10-CM | POA: Diagnosis present

## 2021-08-03 DIAGNOSIS — W19XXXA Unspecified fall, initial encounter: Secondary | ICD-10-CM | POA: Diagnosis not present

## 2021-08-03 DIAGNOSIS — S060XAD Concussion with loss of consciousness status unknown, subsequent encounter: Secondary | ICD-10-CM | POA: Diagnosis not present

## 2021-08-03 DIAGNOSIS — K219 Gastro-esophageal reflux disease without esophagitis: Secondary | ICD-10-CM | POA: Diagnosis not present

## 2021-08-03 DIAGNOSIS — Z79899 Other long term (current) drug therapy: Secondary | ICD-10-CM | POA: Diagnosis not present

## 2021-08-03 DIAGNOSIS — R2681 Unsteadiness on feet: Secondary | ICD-10-CM | POA: Diagnosis not present

## 2021-08-03 DIAGNOSIS — M6281 Muscle weakness (generalized): Secondary | ICD-10-CM | POA: Diagnosis not present

## 2021-08-03 DIAGNOSIS — K760 Fatty (change of) liver, not elsewhere classified: Secondary | ICD-10-CM | POA: Diagnosis present

## 2021-08-03 DIAGNOSIS — I6523 Occlusion and stenosis of bilateral carotid arteries: Secondary | ICD-10-CM | POA: Diagnosis not present

## 2021-08-03 DIAGNOSIS — Z66 Do not resuscitate: Secondary | ICD-10-CM | POA: Diagnosis present

## 2021-08-03 DIAGNOSIS — D320 Benign neoplasm of cerebral meninges: Secondary | ICD-10-CM | POA: Diagnosis present

## 2021-08-03 DIAGNOSIS — G4730 Sleep apnea, unspecified: Secondary | ICD-10-CM | POA: Diagnosis present

## 2021-08-03 DIAGNOSIS — R0609 Other forms of dyspnea: Secondary | ICD-10-CM | POA: Diagnosis not present

## 2021-08-03 DIAGNOSIS — Z86711 Personal history of pulmonary embolism: Secondary | ICD-10-CM | POA: Diagnosis not present

## 2021-08-03 DIAGNOSIS — Z7901 Long term (current) use of anticoagulants: Secondary | ICD-10-CM | POA: Diagnosis not present

## 2021-08-03 DIAGNOSIS — E039 Hypothyroidism, unspecified: Secondary | ICD-10-CM | POA: Diagnosis present

## 2021-08-03 DIAGNOSIS — R7401 Elevation of levels of liver transaminase levels: Secondary | ICD-10-CM | POA: Diagnosis not present

## 2021-08-03 DIAGNOSIS — M549 Dorsalgia, unspecified: Secondary | ICD-10-CM | POA: Diagnosis present

## 2021-08-03 DIAGNOSIS — Z8249 Family history of ischemic heart disease and other diseases of the circulatory system: Secondary | ICD-10-CM | POA: Diagnosis not present

## 2021-08-03 DIAGNOSIS — R531 Weakness: Secondary | ICD-10-CM | POA: Diagnosis present

## 2021-08-03 DIAGNOSIS — J42 Unspecified chronic bronchitis: Secondary | ICD-10-CM | POA: Diagnosis present

## 2021-08-03 DIAGNOSIS — H409 Unspecified glaucoma: Secondary | ICD-10-CM | POA: Diagnosis present

## 2021-08-03 DIAGNOSIS — R001 Bradycardia, unspecified: Secondary | ICD-10-CM | POA: Diagnosis present

## 2021-08-03 DIAGNOSIS — Z741 Need for assistance with personal care: Secondary | ICD-10-CM | POA: Diagnosis not present

## 2021-08-03 DIAGNOSIS — R278 Other lack of coordination: Secondary | ICD-10-CM | POA: Diagnosis not present

## 2021-08-03 DIAGNOSIS — H8110 Benign paroxysmal vertigo, unspecified ear: Secondary | ICD-10-CM | POA: Diagnosis present

## 2021-08-03 DIAGNOSIS — I872 Venous insufficiency (chronic) (peripheral): Secondary | ICD-10-CM | POA: Diagnosis not present

## 2021-08-03 DIAGNOSIS — R2689 Other abnormalities of gait and mobility: Secondary | ICD-10-CM | POA: Diagnosis not present

## 2021-08-03 LAB — URINALYSIS, COMPLETE (UACMP) WITH MICROSCOPIC
Bacteria, UA: NONE SEEN
Bilirubin Urine: NEGATIVE
Glucose, UA: NEGATIVE mg/dL
Hgb urine dipstick: NEGATIVE
Ketones, ur: NEGATIVE mg/dL
Leukocytes,Ua: NEGATIVE
Nitrite: NEGATIVE
Protein, ur: NEGATIVE mg/dL
Specific Gravity, Urine: >1.046 — ABNORMAL HIGH (ref 1.005–1.030)
pH: 7 (ref 5.0–8.0)

## 2021-08-03 LAB — BASIC METABOLIC PANEL
Anion gap: 5 (ref 5–15)
BUN: 18 mg/dL (ref 8–23)
CO2: 27 mmol/L (ref 22–32)
Calcium: 9 mg/dL (ref 8.9–10.3)
Chloride: 105 mmol/L (ref 98–111)
Creatinine, Ser: 0.89 mg/dL (ref 0.44–1.00)
GFR, Estimated: >60 mL/min (ref >60)
Glucose, Bld: 115 mg/dL — ABNORMAL HIGH (ref 70–99)
Potassium: 4.6 mmol/L (ref 3.5–5.1)
Sodium: 137 mmol/L (ref 135–145)

## 2021-08-03 LAB — CBC
HCT: 42.5 % (ref 36.0–46.0)
Hemoglobin: 13.5 g/dL (ref 12.0–15.0)
MCH: 29.2 pg (ref 26.0–34.0)
MCHC: 31.8 g/dL (ref 30.0–36.0)
MCV: 92 fL (ref 80.0–100.0)
Platelets: 233 10*3/uL (ref 150–400)
RBC: 4.62 MIL/uL (ref 3.87–5.11)
RDW: 13.6 % (ref 11.5–15.5)
WBC: 9.5 10*3/uL (ref 4.0–10.5)
nRBC: 0 % (ref 0.0–0.2)

## 2021-08-03 LAB — VITAMIN B12: Vitamin B-12: 294 pg/mL (ref 180–914)

## 2021-08-03 MED ORDER — ASCORBIC ACID 500 MG PO TABS
1000.0000 mg | ORAL_TABLET | Freq: Every day | ORAL | Status: DC
  Administered 2021-08-03 – 2021-08-08 (×6): 1000 mg via ORAL
  Filled 2021-08-03 (×6): qty 2

## 2021-08-03 MED ORDER — ADULT MULTIVITAMIN W/MINERALS CH
1.0000 | ORAL_TABLET | Freq: Every day | ORAL | Status: DC
  Administered 2021-08-03 – 2021-08-08 (×6): 1 via ORAL
  Filled 2021-08-03 (×6): qty 1

## 2021-08-03 MED ORDER — IOHEXOL 350 MG/ML SOLN
75.0000 mL | Freq: Once | INTRAVENOUS | Status: AC | PRN
  Administered 2021-08-03: 75 mL via INTRAVENOUS

## 2021-08-03 MED ORDER — TIOTROPIUM BROMIDE MONOHYDRATE 18 MCG IN CAPS
18.0000 ug | ORAL_CAPSULE | Freq: Every day | RESPIRATORY_TRACT | Status: DC
  Administered 2021-08-03 – 2021-08-08 (×6): 18 ug via RESPIRATORY_TRACT
  Filled 2021-08-03 (×2): qty 5

## 2021-08-03 MED ORDER — FAMOTIDINE 20 MG PO TABS
20.0000 mg | ORAL_TABLET | Freq: Two times a day (BID) | ORAL | Status: DC
  Administered 2021-08-03 – 2021-08-08 (×11): 20 mg via ORAL
  Filled 2021-08-03 (×11): qty 1

## 2021-08-03 MED ORDER — IRBESARTAN 150 MG PO TABS
150.0000 mg | ORAL_TABLET | Freq: Every day | ORAL | Status: DC
  Administered 2021-08-03 – 2021-08-07 (×5): 150 mg via ORAL
  Filled 2021-08-03 (×6): qty 1

## 2021-08-03 NOTE — Assessment & Plan Note (Signed)
-   Resumed myocarditis equivalent of irbesartan 150 mg daily - Patient takes atenolol 25 mg daily, holding this at this time due to possible symptomatic bradycardia

## 2021-08-03 NOTE — Progress Notes (Signed)
Spoke with the patient and her daughter, She lives alone but has Family support, if needed she will go home with her daughter,  [facility] accepted the patient for Home health services She has a rolling walker at home and will need a 3 in 1, [facility] will deliver to the bedside She has transportation with her daughter She can afford her medication

## 2021-08-03 NOTE — Assessment & Plan Note (Signed)
-   Outpatient follow-up as patient is currently not on thyroid medication

## 2021-08-03 NOTE — Evaluation (Signed)
Occupational Therapy Evaluation Patient Details Name: Lauren Morris MRN: [id number] DOB: 04-21-1932 Today's Date: 08/03/2021   History of Present Illness 88 year old female with history of PE, blood completed course of anticoagulation, chronic LFTs, currently being followed by outpatient GI, generalized weakness, obesity, non-insulin-dependent diabetes mellitus, hypothyroid, hypertension, chronic bronchitis, who presents emergency department from home for chief concerns of weakness and vertigo.   Clinical Impression   Lauren Morris was seen for OT evaluation this date, PT in during session to assist with mobility. Prior to hospital admission, pt was Independent for mobility and I/ADLs. Pt lives alone in home c 2 STE and R rail, family available 24/7 if needed. Pt presents to acute OT demonstrating impaired ADL performance and functional mobility 2/2 decreased activity tolerance, vertigo, and functional strength/ROM/balance deficits. Upon arrival pt reporting 7/10 flank pain with movement. Initiated sitting with no physical assist however with head turns pt suddenly states "I'm going round" and requires MOD A to achieve upright position - dizziness subsides in sitting.   Pt currently requires MAX A don B socks seated EOB - pt endorses dizziness with attempts to lean forward for LB access. Initial CGA improving to SUPERVISION grooming seated EOB - assist for R laterallean and c/o dizziness. PT in to assist. MIN A + RW for BSC t/f ~10 ft and perihygiene in standing. Pt would benefit from skilled OT to address noted impairments and functional limitations (see below for any additional details). Upon hospital discharge, recommend STR to maximize pt safety and return to PLOF however if family able to provide 24/7 support including physical assistance with mobility then pt would be safe to d/c home with [facility].        Recommendations for follow up therapy are one component of a multi-disciplinary discharge planning  process, led by the attending physician.  Recommendations may be updated based on patient status, additional functional criteria and insurance authorization.   Follow Up Recommendations  Skilled nursing-short term rehab (<3 hours/day) (may progress)    Assistance Recommended at Discharge Frequent or constant Supervision/Assistance  Patient can return home with the following A lot of help with bathing/dressing/bathroom;A lot of help with walking and/or transfers;Assistance with cooking/housework;Help with stairs or ramp for entrance;Assist for transportation    Functional Status Assessment  Patient has had a recent decline in their functional status and demonstrates the ability to make significant improvements in function in a reasonable and predictable amount of time.  Equipment Recommendations  BSC/3in1    Recommendations for Other Services       Precautions / Restrictions Precautions Precautions: Fall Restrictions Weight Bearing Restrictions: No      Mobility Bed Mobility Overal bed mobility: Needs Assistance Bed Mobility: Supine to Sit     Supine to sit: Mod assist, HOB elevated     General bed mobility comments: assist 2/2 dizziness    Transfers Overall transfer level: Needs assistance Equipment used: Rolling walker (2 wheels) Transfers: Sit to/from Stand Sit to Stand: Min assist                  Balance Overall balance assessment: Needs assistance Sitting-balance support: Bilateral upper extremity supported, Feet supported Sitting balance-Leahy Scale: Fair     Standing balance support: Single extremity supported, During functional activity Standing balance-Leahy Scale: Fair                             ADL either performed or assessed with clinical judgement  ADL Overall ADL's : Needs assistance/impaired                                       General ADL Comments: MAX A don B socks seated EOB - pt endorses dizziness with  attempts to lean forward for LB access. Initial CGA improving to SUPERVISION grooming seated EOB - assist for R laterallean and c/o dizziness. MIN A + RW for BSC t/f and perihygiene in standing.     Vision   Vision Assessment?: Yes;Vision impaired- to be further tested in functional context Tracking/Visual Pursuits: Decreased smoothness of horizontal tracking;Requires cues, head turns, or add eye shifts to track            Pertinent Vitals/Pain Pain Assessment Pain Assessment: 0-10 Pain Score: 7  Pain Location: R flank Pain Descriptors / Indicators: Discomfort, Dull Pain Intervention(s): Limited activity within patient's tolerance, Repositioned     Hand Dominance Right   Extremity/Trunk Assessment Upper Extremity Assessment Upper Extremity Assessment: Overall WFL for tasks assessed   Lower Extremity Assessment Lower Extremity Assessment: Generalized weakness       Communication Communication Communication: No difficulties   Cognition Arousal/Alertness: Awake/alert Behavior During Therapy: WFL for tasks assessed/performed, Anxious Overall Cognitive Status: Within Functional Limits for tasks assessed                                 General Comments: A&Ox4, initially states in hospital for covid then relates fall as reason      Home Living Family/patient expects to be discharged to:: Private residence Living Arrangements: Alone Available Help at Discharge: Family;Available 24 hours/day Type of Home: House Home Access: Stairs to enter     Home Layout: One level     Bathroom Shower/Tub: Occupational psychologist: Standard     Home Equipment: Conservation officer, nature (2 wheels);Cane - single point;Shower seat - built in   Additional Comments: reports no recent fall history. Per discussion with family, they will provide 24/7 assist at discharge      Prior Functioning/Environment Prior Level of Function : Independent/Modified Independent;Driving              Mobility Comments: used RW after covid last year however recently no AD. Drives          OT Problem List: Decreased strength;Decreased range of motion;Decreased activity tolerance;Impaired balance (sitting and/or standing);Decreased safety awareness      OT Treatment/Interventions: Self-care/ADL training;Therapeutic exercise;DME and/or AE instruction;Energy conservation;Therapeutic activities;Patient/family education;Balance training    OT Goals(Current goals can be found in the care plan section) Acute Rehab OT Goals Patient Stated Goal: to get better OT Goal Formulation: With patient/family Time For Goal Achievement: 08/17/21 Potential to Achieve Goals: Good ADL Goals Pt Will Perform Grooming: with set-up;with supervision;standing Pt Will Perform Lower Body Dressing: with min guard assist;sit to/from stand Pt Will Transfer to Toilet: with set-up;with supervision;ambulating;regular height toilet  OT Frequency: Min 2X/week    Co-evaluation PT/OT/SLP Co-Evaluation/Treatment: Yes Reason for Co-Treatment: For patient/therapist safety;To address functional/ADL transfers PT goals addressed during session: Mobility/safety with mobility OT goals addressed during session: ADL's and self-care      AM-PAC OT "6 Clicks" Daily Activity     Outcome Measure Help from another person eating meals?: None Help from another person taking care of personal grooming?: A Lot Help from another person toileting,  which includes using toliet, bedpan, or urinal?: A Little Help from another person bathing (including washing, rinsing, drying)?: A Little Help from another person to put on and taking off regular upper body clothing?: A Little Help from another person to put on and taking off regular lower body clothing?: A Lot 6 Click Score: 17   End of Session Equipment Utilized During Treatment: Rolling walker (2 wheels);Gait belt  Activity Tolerance: Patient tolerated treatment  well Patient left: in chair;with call bell/phone within reach;with chair alarm set;with family/visitor present  OT Visit Diagnosis: Other abnormalities of gait and mobility (R26.89);Muscle weakness (generalized) (M62.81)                Time: 3736-6815 OT Time Calculation (min): 48 min Charges:  OT General Charges $OT Visit: 1 Visit OT Evaluation $OT Eval Moderate Complexity: 1 Mod OT Treatments $Self Care/Home Management : 23-37 mins  Dessie Coma, M.S. OTR/L  08/03/21, 11:10 AM  ascom [phone]

## 2021-08-03 NOTE — Assessment & Plan Note (Signed)
-   Etiology work-up in progress - MRI of the brain was positive for soft tissue contusion of the right parietal occipital scalp and 1.6 cm meningioma at the peripheral aspect of the right cerebellum without significant mass effect. - Recommend a.m. team to consult neurology if patient's symptoms do not improve

## 2021-08-03 NOTE — Assessment & Plan Note (Signed)
-   This is new per patient and family at bedside - Discussed with patient that the size of the meningioma is 1.6 cm, recommend outpatient follow-up - Daughter and patient endorses understanding

## 2021-08-03 NOTE — H&P (Signed)
History and Physical   Lauren Morris IRW:[id number] DOB: 1932/04/08 DOA: 08/02/2021  PCP: [name], MD (Confirm with patient/family/NH records and if not entered, this has to be entered at [facility] point of entry) Outpatient Specialists: [name], digestive health services at [place] Patient coming from: [place]  I have personally briefly reviewed patient's old medical records in [place].  Chief Concern: Dizziness and back pain  HPI: 88 year old female with history of PE, blood completed course of anticoagulation, chronic LFTs, currently being followed by outpatient GI, generalized weakness, obesity, non-insulin-dependent diabetes mellitus, hypothyroid, hypertension, chronic bronchitis, who presents emergency department from home for chief concerns of weakness and vertigo.  Vitals in the emergency department show temperature of 97.5, respiration rate of 68, heart rate of 52, blood pressure 168/70, SPO2 of 98% on room air.  Labs in the emergency department showed serum sodium 137, potassium 4.5, chloride 103, bicarb 24, BUN of 17, serum creatinine of 0.89, nonfasting blood glucose 160, GFR greater than 60, AST 81, ALT 66.  WBC was 9.1, hemoglobin 14.9, platelets of 250.  COVID/influenza A/influenza B PCR were negative.  Troponin high-sensitivity was 11x2.  At bedside patient is able to tell me her name, age, current location and identify her daughter at bedside.  She states that she was at lunch with a group from church.  She was walking down 1 step and the next thing she knew she was falling backwards and she hit her head.  She denies at that time any new shortness of breath, chest pain, abdominal pain.  She denies any vision changes at that time.  In the recent days she denies any dysuria, hematuria, diarrhea, constipation.  Her last bowel movement was yesterday and it was a good normal bowel movement for her.  She was told she had lost consciousness however  she does not know how long.  Since the fall she has been having dizziness and vertigo-like symptoms.  The symptoms are worse with movements and jostling around moving from bed to bed since being hospitalized.  She also endorses musculoskeletal back pain that is worse with movement.  She reports this is never happened before.  She denies any significant weight changes in the last 6 months.  She reports that she did lose about 10 pounds in January 2022 after getting COVID however this has resolved.  Social history: She denies history of tobacco, EtOH, recreational drug use.  ROS: Constitutional: no weight change, no fever ENT/Mouth: no sore throat, no rhinorrhea Eyes: no eye pain, no vision changes Cardiovascular: no chest pain, no dyspnea,  no edema, no palpitations Respiratory: no cough, no sputum, no wheezing Gastrointestinal: no nausea, no vomiting, no diarrhea, no constipation Genitourinary: no urinary incontinence, no dysuria, no hematuria Musculoskeletal: no arthralgias, no myalgias Skin: no skin lesions, no pruritus, Neuro: + weakness, + loss of consciousness, no syncope, + vertigo Psych: no anxiety, no depression, + decrease appetite Heme/Lymph: no bruising, no bleeding  ED Course: Discussed with emergency medicine provider, patient requiring hospitalization for chief concerns of inability to ambulate.  Assessment/Plan  Principal Problem:   Dizziness Active Problems:   Essential hypertension   Acquired hypothyroidism   Fatty liver disease, nonalcoholic   Chronic bronchitis (HCC)   Weakness   Vertigo   Do not resuscitate status   Musculoskeletal back pain   Meningioma (HCC)   * Dizziness Assessment & Plan - Etiology work-up in progress - MRI of the brain was positive for soft tissue  contusion of the right parietal occipital scalp and 1.6 cm meningioma at the peripheral aspect of the right cerebellum without significant mass effect. - Recommend a.m. team to consult  neurology if patient's symptoms do not improve  Cardiovascular and Mediastinum Essential hypertension Assessment & Plan - Resumed myocarditis equivalent of irbesartan 150 mg daily - Patient takes atenolol 25 mg daily, holding this at this time due to possible symptomatic bradycardia  Endocrine Acquired hypothyroidism Assessment & Plan - Outpatient follow-up as patient is currently not on thyroid medication due to medication causing hepatic steatosis per daughter at bedside  Nervous and Auditory Meningioma [facility]) Assessment & Plan - This is new per patient and family at bedside - Discussed with patient that the size of the meningioma is 1.6 cm, recommend outpatient follow-up - Daughter and patient endorses understanding  Other Musculoskeletal back pain Assessment & Plan - Norco 5-3 25, 1 tablet p.o. every 6 hours as needed for moderate pain; morphine 0.5 mg IV every 4 hours as needed for severe pain ordered - PT, OT  Chart reviewed.   DVT prophylaxis: Enoxaparin Code Status: DNR Diet: Heart healthy Family Communication: Updated daughter at bedside Disposition Plan: Pending clinical status Consults called: None at this time, recommend a.m. team to consult neurology Admission status: MedSurg, observation, no telemetry  Past Medical History:  Diagnosis Date   Cyst    hx o on right breast   DJD (degenerative joint disease)    right knee   Glaucoma    Hypertension    Pneumonia due to COVID-19 virus 12/12/2020   Sleep apnea    Thyroid disease    Past Surgical History:  Procedure Laterality Date   ABDOMINAL HYSTERECTOMY     VITRECTOMY  July 2012   right eye with membrane peel [name], [place])   Social History:  reports that she has never smoked. She has never used smokeless tobacco. She reports that she does not drink alcohol and does not use drugs.  No Known Allergies Family History  Problem Relation Age of Onset   Cancer Father    Hypertension Father    Heart  attack Mother    Hypertension Mother    Family history: Family history reviewed and not pertinent.  Prior to Admission medications   Medication Sig Start Date End Date Taking? Authorizing Provider  Ascorbic Acid (VITAMIN C) 1000 MG tablet Take 1,000 mg by mouth daily.   Yes [provider]  atenolol (TENORMIN) 25 MG tablet TAKE 1 TABLET BY MOUTH TWICE DAILY 07/07/21  Yes [name], MD  Calcium Carb-Cholecalciferol (CALCIUM 1000 + D PO) Take 1 tablet by mouth daily.   Yes [provider]  dorzolamide-timolol (COSOPT) 22.3-6.8 MG/ML ophthalmic solution Place 1 drop into both eyes 2 (two) times daily.   Yes [provider]  famotidine (PEPCID) 20 MG tablet TAKE 1 TABLET BY MOUTH 2 TIMES DAILY 07/07/21  Yes [name], MD  fish oil-omega-3 fatty acids 1000 MG capsule Take 2 g by mouth daily.   Yes [provider]  Multiple Vitamin (MULTIVITAMIN WITH MINERALS) TABS tablet Take 1 tablet by mouth daily. 11/27/20  Yes Regalado, Cassie Freer, MD  SPIRIVA HANDIHALER 18 MCG inhalation capsule INHALE CONTENTS OF 1 CAPSULE AS DIRECTEDONCE A DAY VIA HANDIHALER 02/09/21  Yes [name], MD  telmisartan (MICARDIS) 40 MG tablet TAKE 1 TABLET BY MOUTH AT BEDTIME 05/25/21  Yes [name], MD  zinc sulfate 220 (50 Zn) MG capsule Take 1 capsule (220  mg total) by mouth daily. 11/27/20  Yes Regalado, Belkys A, MD  ELIQUIS 5 MG TABS tablet TAKE 1 TABLET BY MOUTH 2 TIMES DAILY. Patient not taking: Reported on 08/02/2021 04/03/21   [name], MD  furosemide (LASIX) 20 MG tablet TAKE ONE TABLET BY MOUTH DAILY AS NEEDEDFOR FLUID RETENTION Patient taking differently: Take 20 mg by mouth daily. TAKE ONE TABLET BY MOUTH DAILY AS NEEDED FOR FLUID RETENTION 10/03/18   [name], MD   Physical Exam: Vitals:   08/02/21 1429 08/02/21 1900 08/02/21 2150 08/02/21 2320  BP: (!) 168/70 (!) 153/66 (!) 160/62 (!) 148/62  Pulse: (!) 52 64 60 61  Resp: 16 17 15 14   Temp: (!)  97.5 F (36.4 C)  98.3 F (36.8 C) 98 F (36.7 C)  TempSrc: Oral     SpO2: 98% 92% 97% 95%  Weight:      Height:       Constitutional: appears younger than chronological age, NAD, calm, comfortable Eyes: PERRL, lids and conjunctivae normal ENMT: Mucous membranes are moist. Posterior pharynx clear of any exudate or lesions. Age-appropriate dentition. Hearing appropriate Neck: normal, supple, no masses, no thyromegaly Respiratory: clear to auscultation bilaterally, no wheezing, no crackles. Normal respiratory effort. No accessory muscle use.  Cardiovascular: Regular rate and rhythm, no murmurs / rubs / gallops. No extremity edema. 2+ pedal pulses. No carotid bruits.  Abdomen: no tenderness, no masses palpated, no hepatosplenomegaly. Bowel sounds positive.  Musculoskeletal: no clubbing / cyanosis. No joint deformity upper and lower extremities. Good ROM, no contractures, no atrophy. Normal muscle tone.  Musculoskeletal back pain worse with movement Skin: no rashes, lesions, ulcers. No induration Neurologic: Sensation intact. Strength 5/5 in all 4.  Psychiatric: Normal judgment and insight. Alert and oriented x 3. Normal mood.   EKG: independently reviewed, showing sinus bradycardia with rate of 52, QTc 409  Chest x-ray on Admission: I personally reviewed and I agree with radiologist reading as below.  DG Chest 2 View  Result Date: 08/02/2021 CLINICAL DATA:  Fall.  Left-sided chest and rib pain. EXAM: CHEST - 2 VIEW COMPARISON:  01/10/2021 FINDINGS: Stable mild cardiomegaly. Aortic atherosclerotic calcification noted. Both lungs are clear. IMPRESSION: Stable mild cardiomegaly. No active disease. Electronically Signed   By: Marlaine Hind M.D.   On: 08/02/2021 14:06   CT HEAD WO CONTRAST (5MM)  Result Date: 08/02/2021 CLINICAL DATA:  An 88 year old female presents for evaluation of neck trauma without loss of consciousness by report. EXAM: CT HEAD WITHOUT CONTRAST CT CERVICAL SPINE WITHOUT  CONTRAST TECHNIQUE: Multidetector CT imaging of the head and cervical spine was performed following the standard protocol without intravenous contrast. Multiplanar CT image reconstructions of the cervical spine were also generated. RADIATION DOSE REDUCTION: This exam was performed according to the departmental dose-optimization program which includes automated exposure control, adjustment of the mA and/or kV according to patient size and/or use of iterative reconstruction technique. COMPARISON:  None FINDINGS: CT HEAD FINDINGS Brain: No evidence of acute infarction, hemorrhage, hydrocephalus, extra-axial collection or mass lesion/mass effect. Signs of atrophy and chronic microvascular ischemic change, mild. Vascular: No hyperdense vessel or unexpected calcification. Skull: Normal. Negative for fracture or focal lesion. Sinuses/Orbits: No acute finding. Other: Stranding in the scalp of the RIGHT occipital parietal region just to the RIGHT of midline. No underlying calvarial abnormality. CT CERVICAL SPINE FINDINGS Alignment: Minimal anterolisthesis of C2 on C3 approximately 1-2 mm in the setting of degenerative changes. Cervical lordotic curvature otherwise without abnormality. No overlying  prevertebral soft tissue swelling. Skull base and vertebrae: No acute fracture. No primary bone lesion or focal pathologic process. Soft tissues and spinal canal: No prevertebral fluid or swelling. No visible canal hematoma. Disc levels: Multilevel degenerative changes throughout the cervical spine. Disc space narrowing at multiple levels greatest at C5-6 and C6-7. Facet arthropathy greatest in the upper cervical spine on the LEFT. Upper chest: Negative. Other: None IMPRESSION: 1. No acute intracranial abnormality. Signs of mild atrophy and chronic microvascular ischemic change. 2. Stranding/contusion in the scalp of the RIGHT occipital parietal region just to the RIGHT of midline without underlying calvarial abnormality. 3. No  acute fracture or static subluxation of the cervical spine. 4. Multilevel degenerative changes of the cervical spine. Electronically Signed   By: Zetta Bills M.D.   On: 08/02/2021 14:23   CT Angio Chest PE W and/or Wo Contrast  Result Date: 08/02/2021 CLINICAL DATA:  Right rib pain following a fall today. Clinical concern for pulmonary embolism. EXAM: CT ANGIOGRAPHY CHEST WITH CONTRAST TECHNIQUE: Multidetector CT imaging of the chest was performed using the standard protocol during bolus administration of intravenous contrast. Multiplanar CT image reconstructions and MIPs were obtained to evaluate the vascular anatomy. RADIATION DOSE REDUCTION: This exam was performed according to the departmental dose-optimization program which includes automated exposure control, adjustment of the mA and/or kV according to patient size and/or use of iterative reconstruction technique. CONTRAST:  58mL OMNIPAQUE IOHEXOL 350 MG/ML SOLN COMPARISON:  06/22/2021.  Chest radiographs obtained earlier today. FINDINGS: Cardiovascular: Stable mildly enlarged heart. Normally opacified pulmonary arteries with no pulmonary arterial filling defects seen. Atheromatous calcifications, including the coronary arteries and aorta. Stable borderline enlarged central pulmonary arteries. Mediastinum/Nodes: No enlarged mediastinal, hilar, or axillary lymph nodes. Thyroid gland, trachea, and esophagus demonstrate no significant findings. Lungs/Pleura: Stable mosaic ground-glass opacity in both lungs. Stable minimal atelectasis or scarring at both posterior lung bases. No pleural fluid. Upper Abdomen: Unremarkable. Musculoskeletal: Thoracic spine degenerative changes, including changes of DISH. Review of the MIP images confirms the above findings. IMPRESSION: 1. No pulmonary emboli or other acute abnormality. 2. Stable mosaic interstitial pattern throughout both lungs compatible with air trapping due to small airway disease or possible multifocal  inflammation. 3.  Calcific coronary artery and aortic atherosclerosis. Aortic Atherosclerosis (ICD10-I70.0). Electronically Signed   By: Claudie Revering M.D.   On: 08/02/2021 16:35   CT Cervical Spine Wo Contrast  Result Date: 08/02/2021 CLINICAL DATA:  An 88 year old female presents for evaluation of neck trauma without loss of consciousness by report. EXAM: CT HEAD WITHOUT CONTRAST CT CERVICAL SPINE WITHOUT CONTRAST TECHNIQUE: Multidetector CT imaging of the head and cervical spine was performed following the standard protocol without intravenous contrast. Multiplanar CT image reconstructions of the cervical spine were also generated. RADIATION DOSE REDUCTION: This exam was performed according to the departmental dose-optimization program which includes automated exposure control, adjustment of the mA and/or kV according to patient size and/or use of iterative reconstruction technique. COMPARISON:  None FINDINGS: CT HEAD FINDINGS Brain: No evidence of acute infarction, hemorrhage, hydrocephalus, extra-axial collection or mass lesion/mass effect. Signs of atrophy and chronic microvascular ischemic change, mild. Vascular: No hyperdense vessel or unexpected calcification. Skull: Normal. Negative for fracture or focal lesion. Sinuses/Orbits: No acute finding. Other: Stranding in the scalp of the RIGHT occipital parietal region just to the RIGHT of midline. No underlying calvarial abnormality. CT CERVICAL SPINE FINDINGS Alignment: Minimal anterolisthesis of C2 on C3 approximately 1-2 mm in the setting of degenerative changes. Cervical lordotic curvature  otherwise without abnormality. No overlying prevertebral soft tissue swelling. Skull base and vertebrae: No acute fracture. No primary bone lesion or focal pathologic process. Soft tissues and spinal canal: No prevertebral fluid or swelling. No visible canal hematoma. Disc levels: Multilevel degenerative changes throughout the cervical spine. Disc space narrowing at  multiple levels greatest at C5-6 and C6-7. Facet arthropathy greatest in the upper cervical spine on the LEFT. Upper chest: Negative. Other: None IMPRESSION: 1. No acute intracranial abnormality. Signs of mild atrophy and chronic microvascular ischemic change. 2. Stranding/contusion in the scalp of the RIGHT occipital parietal region just to the RIGHT of midline without underlying calvarial abnormality. 3. No acute fracture or static subluxation of the cervical spine. 4. Multilevel degenerative changes of the cervical spine. Electronically Signed   By: Zetta Bills M.D.   On: 08/02/2021 14:23   CT Thoracic Spine Wo Contrast  Result Date: 08/02/2021 CLINICAL DATA:  Back trauma after a fall. EXAM: CT THORACIC SPINE WITHOUT CONTRAST TECHNIQUE: Multidetector CT images of the thoracic were obtained using the standard protocol without intravenous contrast. RADIATION DOSE REDUCTION: This exam was performed according to the departmental dose-optimization program which includes automated exposure control, adjustment of the mA and/or kV according to patient size and/or use of iterative reconstruction technique. COMPARISON:  CT chest 06/22/2021 FINDINGS: Alignment: Normal alignment. Vertebrae: No vertebral compression deformities. No focal bone lesion or bone destruction. Paraspinal and other soft tissues: No abnormal paraspinal soft tissue mass or infiltration. Disc levels: Degenerative changes throughout the thoracic spine with narrowed interspaces and endplate osteophyte formation throughout. Prominent disc osteophyte complexes at T7-8, T8-9 and T11-12 levels cause some bone encroachment upon the central canal. Extensive ligamentous calcification along the posterior spinous processes. IMPRESSION: 1. No acute displaced fractures are identified. 2. Prominent degenerative changes throughout. Electronically Signed   By: Lucienne Capers M.D.   On: 08/02/2021 18:20   CT Lumbar Spine Wo Contrast  Result Date:  08/02/2021 CLINICAL DATA:  Back pain after a fall. EXAM: CT Lumbar Spine with contrast TECHNIQUE: Technique: Multiplanar CT images of the lumbar spine were reconstructed from contemporary CT of the Abdomen and Pelvis. RADIATION DOSE REDUCTION: This exam was performed according to the departmental dose-optimization program which includes automated exposure control, adjustment of the mA and/or kV according to patient size and/or use of iterative reconstruction technique. CONTRAST:  No additional contrast material was used. COMPARISON:  CT chest 12/22/2018 FINDINGS: Segmentation: 5 lumbar type vertebral bodies. Alignment: Mild anterior subluxation of L5 on S1. Otherwise normal alignment of the lumbar spine and posterior elements. Vertebrae: No vertebral compression deformities. No focal bone lesion or bone destruction. Bone cortex appears intact. Paraspinal and other soft tissues: No abnormal paraspinal soft tissue swelling or mass. Fatty atrophy of the posterior paraspinal muscles. Calcification of the aorta without aneurysm. Disc levels: Degenerative changes with disc space narrowing and endplate osteophyte formation most prominent at L3-4 and L4-5 levels. Degenerative disc disease at L3-4 and L4-5. Endplate osteophyte formation and facet joint degenerative changes throughout the lumbar spine. IMPRESSION: 1. Mild anterior subluxation of L5 on S1. This is likely degenerative but without comparison studies, could indicate ligamentous injury. 2. No acute displaced fractures are identified. 3. Degenerative changes in the lumbar spine and facet joints. Electronically Signed   By: Lucienne Capers M.D.   On: 08/02/2021 18:15   MR BRAIN WO CONTRAST  Result Date: 08/02/2021 CLINICAL DATA:  Initial evaluation for acute dizziness. EXAM: MRI HEAD WITHOUT CONTRAST TECHNIQUE: Multiplanar, multiecho pulse sequences of  the brain and surrounding structures were obtained without intravenous contrast. COMPARISON:  CT from earlier  the same day. FINDINGS: Brain: Cerebral volume within normal limits. Mild chronic microvascular ischemic disease for age. No abnormal foci of restricted diffusion to suggest acute or subacute ischemia. Gray-white matter differentiation maintained. No areas of chronic cortical infarction. No acute or chronic intracranial hemorrhage. 1.6 cm meningioma seen at the peripheral aspect of the right cerebellum (series 15, image 12). Meningioma mildly indents the adjacent right cerebellum without significant regional mass effect or vasogenic edema. No other mass lesion, mass effect or midline shift. No hydrocephalus or extra-axial fluid collection. Pituitary gland suprasellar region normal. Midline structures intact. Vascular: Major intracranial vascular flow voids are maintained. Skull and upper cervical spine: Craniocervical junction normal. Bone marrow signal intensity within normal limits. Soft tissue contusion present at the right parieto-occipital scalp. Sinuses/Orbits: Prior bilateral ocular lens replacement. Paranasal sinuses are largely clear. Trace bilateral mastoid effusions, of doubtful significance. Visualized nasopharynx unremarkable. Other: None. IMPRESSION: 1. No acute intracranial abnormality. 2. Soft tissue contusion at the right parieto-occipital scalp. 3. 1.6 cm meningioma at the peripheral aspect of the right cerebellum without significant mass effect. 4. Mild chronic microvascular ischemic disease for age. Electronically Signed   By: Jeannine Boga M.D.   On: 08/02/2021 22:36   CT ABDOMEN PELVIS W CONTRAST  Addendum Date: 08/02/2021   ADDENDUM REPORT: 08/02/2021 18:45 ADDENDUM: Following should be included in the impression. There is subtle increased density in the dependent portion of nondistended gallbladder. This may suggest presence of sludge or tiny gallbladder stones. There are no imaging signs of acute cholecystitis. Follow-up gallbladder sonogram as clinically warranted may be  considered. Electronically Signed   By: Elmer Picker M.D.   On: 08/02/2021 18:45   Result Date: 08/02/2021 CLINICAL DATA:  Trauma, right chest pain EXAM: CT ABDOMEN AND PELVIS WITH CONTRAST TECHNIQUE: Multidetector CT imaging of the abdomen and pelvis was performed using the standard protocol following bolus administration of intravenous contrast. RADIATION DOSE REDUCTION: This exam was performed according to the departmental dose-optimization program which includes automated exposure control, adjustment of the mA and/or kV according to patient size and/or use of iterative reconstruction technique. CONTRAST:  63mL OMNIPAQUE IOHEXOL 300 MG/ML  SOLN COMPARISON:  None. FINDINGS: Lower chest: Small linear densities seen in both lower lung fields. Heart is enlarged in size. There are scattered coronary artery calcifications. Dense calcification is seen in the mitral annulus. Hepatobiliary: Liver measures 18.3 cm in length. There is no dilation of bile ducts. There is subtle increased density in the dependent portion of gallbladder lumen. Gallbladder is not distended. Pancreas: No focal lesions are seen. There are few small calcifications in the body and tail. Spleen: Spleen measures 12.9 cm in maximum diameter. Adrenals/Urinary Tract: Adrenals are unremarkable. There is no hydronephrosis. There is contrast in the collecting systems, ureters and urinary bladder possibly related to timing of imaging sequence. This limits evaluation for small renal stones. There are foci of cortical thinning in the kidneys. There is no perinephric fluid collection. Stomach/Bowel: Stomach is unremarkable. Small bowel loops are not dilated. Appendix is not distinctly seen. Scattered diverticula are seen in the colon without signs of focal acute diverticulitis. Vascular/Lymphatic: There is ectasia of infrarenal abdominal aorta measuring 2.9 cm in the AP diameter. There are scattered atherosclerotic plaques and calcifications in the  aorta and its major branches. Reproductive: Uterus is not seen. There are no dominant adnexal masses. Other: There is no ascites or pneumoperitoneum. Umbilical hernia  containing fat is seen. Small bilateral inguinal hernias containing fat seen. Musculoskeletal: Degenerative changes are noted in lumbar spine with spinal stenosis and encroachment of neural foramina from L2-S1 levels. There is minimal anterolisthesis at L5-S1 level. IMPRESSION: There is no evidence of intestinal obstruction or pneumoperitoneum. There is no hydronephrosis. Diverticulosis of colon without focal acute diverticulitis. Enlarged liver. Enlarged spleen. Small linear densities in both lower lung fields may suggest scarring or minimal subsegmental atelectasis. There is ectasia of infrarenal abdominal aorta measuring 2.9 cm in maximum diameter. Lumbar spondylosis with spinal stenosis. Other findings as described in the body of the report. Electronically Signed: By: Elmer Picker M.D. On: 08/02/2021 18:18    Labs on Admission: I have personally reviewed following labs  CBC: Recent Labs  Lab 08/02/21 1454  WBC 9.1  NEUTROABS 5.5  HGB 14.9  HCT 46.1*  MCV 91.5  PLT 683   Basic Metabolic Panel: Recent Labs  Lab 08/02/21 1454  NA 137  K 4.5  CL 103  CO2 24  GLUCOSE 160*  BUN 17  CREATININE 0.89  CALCIUM 8.9  MG 2.1  PHOS 3.5   GFR: Estimated Creatinine Clearance: 41.5 mL/min (by C-G formula based on SCr of 0.89 mg/dL).  Liver Function Tests: Recent Labs  Lab 08/02/21 1454  AST 81*  ALT 66*  ALKPHOS 531*  BILITOT 1.3*  PROT 7.6  ALBUMIN 3.5   Urine analysis:    Component Value Date/Time   COLORURINE YELLOW (A) 11/23/2020 1937   APPEARANCEUR HAZY (A) 11/23/2020 1937   LABSPEC 1.017 11/23/2020 1937   PHURINE 5.0 11/23/2020 1937   GLUCOSEU NEGATIVE 11/23/2020 1937   HGBUR NEGATIVE 11/23/2020 1937   BILIRUBINUR NEGATIVE 11/23/2020 1937   BILIRUBINUR neg 05/09/2018 1105   KETONESUR NEGATIVE  11/23/2020 1937   PROTEINUR NEGATIVE 11/23/2020 1937   UROBILINOGEN 0.2 05/09/2018 1105   NITRITE POSITIVE (A) 11/23/2020 1937   LEUKOCYTESUR TRACE (A) 11/23/2020 1937   Dr. Tobie Poet [facility]  If 7PM-7AM, please contact overnight-coverage provider If 7AM-7PM, please contact day coverage provider [email or web address]  08/03/2021, 12:40 AM

## 2021-08-03 NOTE — Consult Note (Signed)
Neurology Consultation  Reason for Consult: Dizziness, fall Referring Physician: Dr. Jimmye Norman, hospitalist  CC: Dizziness, fall  History is obtained from: Chart, patient, patient's daughter at bedside  HPI: Lauren Morris is a 88 y.o. female past medical history of osteoarthritis, hypertension, sleep apnea, glaucoma, PE after she had COVID last year-was on Eliquis which was discontinued at the end of December 2022, chronically deranged LFTs, presented to the emergency room after she sustained a fall while taking a step down at one of the restaurants in town.  She denies LOC or missing the step but says that she has no recollection of how she fell.  She hit the back of her head.  Her head does not hurt but her chest and back hurts on the right side. She denies any palpitations or shortness of breath at that time.  She reports feeling dizzy-that she describes as her whole body and room spinning.  This happens mostly when she stands up from lying down position.  She does not feel dizzy laying down while moving head side-to-side.  No visual changes. No recent illnesses or sicknesses.  No prior history of frequent falls.  At baseline, lives independently, able to perform ADLs by herself.  Family lives around-multiple family members live within driving distance and check on her every day and speak with her every day.  No recent decline in her mentation or memory noted Reports shortness of breath upon standing up.  Reports some chest and back pain on the right side where she fell.  Reports no pain at the site where the head hit the cement.  No fevers chills.   ROS: Full ROS was performed and is negative except as noted in the HPI.  Past Medical History:  Diagnosis Date   Cyst    hx o on right breast   DJD (degenerative joint disease)    right knee   Glaucoma    Hypertension    Pneumonia due to COVID-19 virus 12/12/2020   Sleep apnea    Thyroid disease    Family History  Problem Relation Age of  Onset   Cancer Father    Hypertension Father    Heart attack Mother    Hypertension Mother      Social History:   reports that she has never smoked. She has never used smokeless tobacco. She reports that she does not drink alcohol and does not use drugs.  Medications  Current Facility-Administered Medications:    acetaminophen (TYLENOL) tablet 1,000 mg, 1,000 mg, Oral, Q6H PRN, 1,000 mg at 08/02/21 2023 **OR** acetaminophen (TYLENOL) suppository 650 mg, 650 mg, Rectal, Q6H PRN, Cox, Amy N, DO   ascorbic acid (VITAMIN C) tablet 1,000 mg, 1,000 mg, Oral, Daily, Cox, Amy N, DO, 1,000 mg at 08/03/21 0947   dorzolamide-timolol (COSOPT) 22.3-6.8 MG/ML ophthalmic solution 1 drop, 1 drop, Both Eyes, BID, Cox, Amy N, DO, 1 drop at 08/03/21 0949   enoxaparin (LOVENOX) injection 40 mg, 40 mg, Subcutaneous, Q24H, Cox, Amy N, DO, 40 mg at 08/02/21 2303   famotidine (PEPCID) tablet 20 mg, 20 mg, Oral, BID, Cox, Amy N, DO, 20 mg at 08/03/21 0947   hydrALAZINE (APRESOLINE) tablet 10 mg, 10 mg, Oral, Q6H PRN, Cox, Amy N, DO   HYDROcodone-acetaminophen (NORCO/VICODIN) 5-325 MG per tablet 1 tablet, 1 tablet, Oral, Q6H PRN, Cox, Amy N, DO, 1 tablet at 08/03/21 0622   irbesartan (AVAPRO) tablet 150 mg, 150 mg, Oral, Daily, Cox, Amy N, DO   multivitamin with minerals  tablet 1 tablet, 1 tablet, Oral, Daily, Cox, Amy N, DO, 1 tablet at 08/03/21 0947   ondansetron (ZOFRAN) tablet 4 mg, 4 mg, Oral, Q6H PRN **OR** ondansetron (ZOFRAN) injection 4 mg, 4 mg, Intravenous, Q6H PRN, Cox, Amy N, DO   tiotropium (SPIRIVA) inhalation capsule ([facility] use ONLY) 18 mcg, 18 mcg, Inhalation, Daily, Cox, Amy N, DO, 18 mcg at 08/03/21 0950   Exam: Current vital signs: BP (!) 150/58 (BP Location: Right Arm)    Pulse (!) 52    Temp 98.2 F (36.8 C)    Resp 16    Ht 5\' 2"  (1.575 m)    Wt 72.3 kg    SpO2 95%    BMI 29.15 kg/m  Vital signs in last 24 hours: Temp:  [97.5 F (36.4 C)-98.3 F (36.8 C)] 98.2 F (36.8 C) (02/09  1132) Pulse Rate:  [51-64] 52 (02/09 1132) Resp:  [14-17] 16 (02/09 1132) BP: (136-168)/(58-70) 150/58 (02/09 1132) SpO2:  [92 %-98 %] 95 % (02/09 1132) Weight:  [72.3 kg] 72.3 kg (02/08 1319) General: Awake alert in no distress HEENT: Normocephalic atraumatic Lungs: Clear Cardiovascular: Regular rate rhythm Abdomen nondistended nontender Extremities warm well perfused Neurological exam Awake alert oriented x3 No dysarthria No evidence of aphasia Cranial nerves II to XII intact without any evidence of nystagmus Motor examination with no drift in the bilateral upper extremities.  There is no drift in the bilateral lower extremities as well but she is mildly weaker in both extremities, somewhat due to pain. Sensation intact to light touch without extinction Coordination examination with no suggestion of dysmetria.  Labs I have reviewed labs in epic and the results pertinent to this consultation are: CBC    Component Value Date/Time   WBC 9.5 08/03/2021 0354   RBC 4.62 08/03/2021 0354   HGB 13.5 08/03/2021 0354   HCT 42.5 08/03/2021 0354   PLT 233 08/03/2021 0354   MCV 92.0 08/03/2021 0354   MCH 29.2 08/03/2021 0354   MCHC 31.8 08/03/2021 0354   RDW 13.6 08/03/2021 0354   LYMPHSABS 2.7 08/02/2021 1454   MONOABS 0.7 08/02/2021 1454   EOSABS 0.2 08/02/2021 1454   BASOSABS 0.1 08/02/2021 1454    CMP     Component Value Date/Time   NA 137 08/03/2021 0354   K 4.6 08/03/2021 0354   CL 105 08/03/2021 0354   CO2 27 08/03/2021 0354   GLUCOSE 115 (H) 08/03/2021 0354   BUN 18 08/03/2021 0354   CREATININE 0.89 08/03/2021 0354   CREATININE 0.72 10/22/2011 0836   CALCIUM 9.0 08/03/2021 0354   PROT 7.6 08/02/2021 1454   ALBUMIN 3.5 08/02/2021 1454   AST 81 (H) 08/02/2021 1454   ALT 66 (H) 08/02/2021 1454   ALKPHOS 531 (H) 08/02/2021 1454   BILITOT 1.3 (H) 08/02/2021 1454   GFRNONAA >60 08/03/2021 0354   GFRNONAA 81 10/22/2011 0836   GFRAA >89 10/22/2011 0836    Lipid  Panel     Component Value Date/Time   CHOL 156 01/14/2020 0853   TRIG 101.0 01/14/2020 0853   HDL 51.30 01/14/2020 0853   CHOLHDL 3 01/14/2020 0853   VLDL 20.2 01/14/2020 0853   LDLCALC 85 01/14/2020 0853   LDLDIRECT 80.0 04/08/2017 0905     Imaging I have reviewed the images obtained:  CT-head-no acute intracranial abnormality.  Stranding/contusion of the scalp of the right occipital and parietal region just to the right of the midline without underlying calvarial abnormality. CT C-spine with no fracture or  static subluxation.  Multilevel DJD seen. CT thoracic spine negative for fractures CT lumbar spine with mild anterior subluxation of L5 on S1-likely degenerative but without comparison studies could indicate a ligamentous injury.  No acute displaced fracture seen.  DJD seen. CT abdomen pelvis negative for obstruction or pneumoperitoneum.  Enlarged liver enlarged spleen seen.  Small linear densities in both lung fields at the lower part may suggest scarring or subsegmental atelectasis.  Ectasia of the infrarenal aorta.  Lumbar spondylosis with spinal stenosis.  Subtle increased density in the dependent portion of the nondistended gallbladder.  Sludge versus stones.  Needs sonogram for follow-up if clinically warranted. MRI brain-no acute abnormality.  1.6 cm meningioma in the peripheral aspect of the right cerebellum without significant mass effect.  In retrospect, seen on CT head with some calcification indicating chronicity.  Assessment:  88 year old who presented after a fall when she fell to the back without loss of consciousness in the absence of any feelings of palpitations or heart racing, continues to have dizziness which she describes as spinning of the room and her whole body when she tries to get up. Differentials remain broad as below. MRI negative for stroke Incidentally a meningioma found in the peripheral aspect of the right cerebellum without significant mass effect-less  likely to be of any clinical significance.  Needs outpatient follow-up.  Impression: Dizziness-unspecified with a broad differential including BPPV, vertebrobasilar insufficiency, orthostatic hypotension and cardiac arrhythmias.  Recommendations: -2D echocardiogram -I have requested physical therapy to perform a Dix-Hallpike maneuver followed by an Epley positive for BPPV. -CT angiogram head and neck to rule out vertebrobasilar insufficiency -Orthostatic vitals-RN has been notified and will perform after patient is done eating lunch. -After the orthostatic vitals have been taken, I would recommend giving the patient some fluids for hydration to see if that helps with her symptoms. -Trial of meclizine as needed -If that does not help, diazepam as needed should be held but the daughter and the patient are both reluctant to use benzodiazepines. -Will benefit from an outpatient cardiac monitoring to look for any evidence of cardiac arrhythmia contributing to the current presentation. -Outpatient neurosurgical follow-up for the meningioma-likely incidental and I doubt would require any surgical intervention.  Plan discussed with the patient and daughter at bedside and relayed to Dr. Jimmye Norman via secure chat.   -- Amie Portland, MD Neurologist [facility] Pager: [phone]

## 2021-08-03 NOTE — Progress Notes (Signed)
PROGRESS NOTE    Lauren Morris  QPR:[id number] DOB: 12/01/1931 DOA: 08/02/2021 PCP: [name], MD   Assessment & Plan:   Principal Problem:   Dizziness Active Problems:   Essential hypertension   Acquired hypothyroidism   Fatty liver disease, nonalcoholic   Chronic bronchitis (HCC)   Weakness   Vertigo   Do not resuscitate status   Musculoskeletal back pain   Meningioma (HCC)   Dizziness: etiology unclear. MRI of the brain was positive for soft tissue contusion of the right parietal occipital scalp and 1.6 cm meningioma at the peripheral aspect of the right cerebellum without significant mass effect. Neuro consulted   HTN: continue on irbesartan. Continue to hold atenolol secondary to symptomatic bradycardia   Hypothyroidism: not currently on thyroid medication secondary to it causing hepatic steatosis as per pt's daughter   Transaminitis: etiology unclear. Will continue to monitor    Meningioma: new dx. Will need outpatient f/u    Musculoskeletal back pain: continue on norco, morphine prn. PT/OT consulted   DVT prophylaxis: lovenox  Code Status: full  Family Communication: discussed pt's care w/ pt's daughter at bedside and answered her questions  Disposition Plan: likely d/c back home   Level of care: Med-Surg  Status is: Observation The patient remains OBS appropriate and will d/c before 2 midnights.     Consultants:  Neuro   Procedures:   Antimicrobials:    Subjective: Pt c/o dizziness   Objective: Vitals:   08/02/21 2150 08/02/21 2320 08/03/21 0316 08/03/21 0746  BP: (!) 160/62 (!) 148/62 136/62 138/64  Pulse: 60 61 (!) 51 (!) 56  Resp: 15 14 14 16   Temp: 98.3 F (36.8 C) 98 F (36.7 C) 97.9 F (36.6 C) 97.8 F (36.6 C)  TempSrc:      SpO2: 97% 95% 96% 95%  Weight:      Height:        Intake/Output Summary (Last 24 hours) at 08/03/2021 0825 Last data filed at 08/03/2021 9935 Gross per 24 hour  Intake --  Output 150 ml  Net -150 ml    Filed Weights   08/02/21 1319  Weight: 72.3 kg    Examination:  General exam: Appears calm and comfortable  Respiratory system: Clear to auscultation. Respiratory effort normal. Cardiovascular system: S1 & S2+. No rubs, gallops or clicks.  Gastrointestinal system: Abdomen is nondistended, soft and nontender. Normal bowel sounds heard. Central nervous system: Alert and oriented. Moves all extremities  Psychiatry: Judgement and insight appear normal. Flat mood and affect     Data Reviewed: I have personally reviewed following labs and imaging studies  CBC: Recent Labs  Lab 08/02/21 1454 08/03/21 0354  WBC 9.1 9.5  NEUTROABS 5.5  --   HGB 14.9 13.5  HCT 46.1* 42.5  MCV 91.5 92.0  PLT 250 701   Basic Metabolic Panel: Recent Labs  Lab 08/02/21 1454 08/03/21 0354  NA 137 137  K 4.5 4.6  CL 103 105  CO2 24 27  GLUCOSE 160* 115*  BUN 17 18  CREATININE 0.89 0.89  CALCIUM 8.9 9.0  MG 2.1  --   PHOS 3.5  --    GFR: Estimated Creatinine Clearance: 41.5 mL/min (by C-G formula based on SCr of 0.89 mg/dL). Liver Function Tests: Recent Labs  Lab 08/02/21 1454  AST 81*  ALT 66*  ALKPHOS 531*  BILITOT 1.3*  PROT 7.6  ALBUMIN 3.5   No results for input(s): LIPASE, AMYLASE in the last 168 hours. No  results for input(s): AMMONIA in the last 168 hours. Coagulation Profile: No results for input(s): INR, PROTIME in the last 168 hours. Cardiac Enzymes: No results for input(s): CKTOTAL, CKMB, CKMBINDEX, TROPONINI in the last 168 hours. BNP (last 3 results) No results for input(s): PROBNP in the last 8760 hours. HbA1C: No results for input(s): HGBA1C in the last 72 hours. CBG: No results for input(s): GLUCAP in the last 168 hours. Lipid Profile: No results for input(s): CHOL, HDL, LDLCALC, TRIG, CHOLHDL, LDLDIRECT in the last 72 hours. Thyroid Function Tests: No results for input(s): TSH, T4TOTAL, FREET4, T3FREE, THYROIDAB in the last 72 hours. Anemia  Panel: Recent Labs    08/02/21 2226  VITAMINB12 294   Sepsis Labs: No results for input(s): PROCALCITON, LATICACIDVEN in the last 168 hours.  Recent Results (from the past 240 hour(s))  Resp Panel by RT-PCR (Flu A&B, Covid) Nasopharyngeal Swab     Status: None   Collection Time: 08/02/21  2:54 PM   Specimen: Nasopharyngeal Swab; Nasopharyngeal(NP) swabs in vial transport medium  Result Value Ref Range Status   SARS Coronavirus 2 by RT PCR NEGATIVE NEGATIVE Final    Comment: (NOTE) SARS-CoV-2 target nucleic acids are NOT DETECTED.  The SARS-CoV-2 RNA is generally detectable in upper respiratory specimens during the acute phase of infection. The lowest concentration of SARS-CoV-2 viral copies this assay can detect is 138 copies/mL. A negative result does not preclude SARS-Cov-2 infection and should not be used as the sole basis for treatment or other patient management decisions. A negative result may occur with  improper specimen collection/handling, submission of specimen other than nasopharyngeal swab, presence of viral mutation(s) within the areas targeted by this assay, and inadequate number of viral copies(<138 copies/mL). A negative result must be combined with clinical observations, patient history, and epidemiological information. The expected result is Negative.  Fact Sheet for Patients:  EntrepreneurPulse.com.au  Fact Sheet for Healthcare Providers:  IncredibleEmployment.be  This test is no t yet approved or cleared by the [place] FDA and  has been authorized for detection and/or diagnosis of SARS-CoV-2 by FDA under an Emergency Use Authorization (EUA). This EUA will remain  in effect (meaning this test can be used) for the duration of the COVID-19 declaration under Section 564(b)(1) of the Act, 21 U.S.C.section 360bbb-3(b)(1), unless the authorization is terminated  or revoked sooner.       Influenza A by PCR NEGATIVE  NEGATIVE Final   Influenza B by PCR NEGATIVE NEGATIVE Final    Comment: (NOTE) The Xpert Xpress SARS-CoV-2/FLU/RSV plus assay is intended as an aid in the diagnosis of influenza from Nasopharyngeal swab specimens and should not be used as a sole basis for treatment. Nasal washings and aspirates are unacceptable for Xpert Xpress SARS-CoV-2/FLU/RSV testing.  Fact Sheet for Patients: EntrepreneurPulse.com.au  Fact Sheet for Healthcare Providers: IncredibleEmployment.be  This test is not yet approved or cleared by the [place] FDA and has been authorized for detection and/or diagnosis of SARS-CoV-2 by FDA under an Emergency Use Authorization (EUA). This EUA will remain in effect (meaning this test can be used) for the duration of the COVID-19 declaration under Section 564(b)(1) of the Act, 21 U.S.C. section 360bbb-3(b)(1), unless the authorization is terminated or revoked.  Performed at [facility], [address] [place], [place] [address]          Radiology Studies: DG Chest 2 View  Result Date: 08/02/2021 CLINICAL DATA:  Fall.  Left-sided chest and rib pain. EXAM: CHEST - 2  VIEW COMPARISON:  01/10/2021 FINDINGS: Stable mild cardiomegaly. Aortic atherosclerotic calcification noted. Both lungs are clear. IMPRESSION: Stable mild cardiomegaly. No active disease. Electronically Signed   By: Marlaine Hind M.D.   On: 08/02/2021 14:06   CT HEAD WO CONTRAST (5MM)  Result Date: 08/02/2021 CLINICAL DATA:  An 88 year old female presents for evaluation of neck trauma without loss of consciousness by report. EXAM: CT HEAD WITHOUT CONTRAST CT CERVICAL SPINE WITHOUT CONTRAST TECHNIQUE: Multidetector CT imaging of the head and cervical spine was performed following the standard protocol without intravenous contrast. Multiplanar CT image reconstructions of the cervical spine were also generated. RADIATION DOSE REDUCTION: This exam was performed  according to the departmental dose-optimization program which includes automated exposure control, adjustment of the mA and/or kV according to patient size and/or use of iterative reconstruction technique. COMPARISON:  None FINDINGS: CT HEAD FINDINGS Brain: No evidence of acute infarction, hemorrhage, hydrocephalus, extra-axial collection or mass lesion/mass effect. Signs of atrophy and chronic microvascular ischemic change, mild. Vascular: No hyperdense vessel or unexpected calcification. Skull: Normal. Negative for fracture or focal lesion. Sinuses/Orbits: No acute finding. Other: Stranding in the scalp of the RIGHT occipital parietal region just to the RIGHT of midline. No underlying calvarial abnormality. CT CERVICAL SPINE FINDINGS Alignment: Minimal anterolisthesis of C2 on C3 approximately 1-2 mm in the setting of degenerative changes. Cervical lordotic curvature otherwise without abnormality. No overlying prevertebral soft tissue swelling. Skull base and vertebrae: No acute fracture. No primary bone lesion or focal pathologic process. Soft tissues and spinal canal: No prevertebral fluid or swelling. No visible canal hematoma. Disc levels: Multilevel degenerative changes throughout the cervical spine. Disc space narrowing at multiple levels greatest at C5-6 and C6-7. Facet arthropathy greatest in the upper cervical spine on the LEFT. Upper chest: Negative. Other: None IMPRESSION: 1. No acute intracranial abnormality. Signs of mild atrophy and chronic microvascular ischemic change. 2. Stranding/contusion in the scalp of the RIGHT occipital parietal region just to the RIGHT of midline without underlying calvarial abnormality. 3. No acute fracture or static subluxation of the cervical spine. 4. Multilevel degenerative changes of the cervical spine. Electronically Signed   By: Zetta Bills M.D.   On: 08/02/2021 14:23   CT Angio Chest PE W and/or Wo Contrast  Result Date: 08/02/2021 CLINICAL DATA:  Right rib  pain following a fall today. Clinical concern for pulmonary embolism. EXAM: CT ANGIOGRAPHY CHEST WITH CONTRAST TECHNIQUE: Multidetector CT imaging of the chest was performed using the standard protocol during bolus administration of intravenous contrast. Multiplanar CT image reconstructions and MIPs were obtained to evaluate the vascular anatomy. RADIATION DOSE REDUCTION: This exam was performed according to the departmental dose-optimization program which includes automated exposure control, adjustment of the mA and/or kV according to patient size and/or use of iterative reconstruction technique. CONTRAST:  66mL OMNIPAQUE IOHEXOL 350 MG/ML SOLN COMPARISON:  06/22/2021.  Chest radiographs obtained earlier today. FINDINGS: Cardiovascular: Stable mildly enlarged heart. Normally opacified pulmonary arteries with no pulmonary arterial filling defects seen. Atheromatous calcifications, including the coronary arteries and aorta. Stable borderline enlarged central pulmonary arteries. Mediastinum/Nodes: No enlarged mediastinal, hilar, or axillary lymph nodes. Thyroid gland, trachea, and esophagus demonstrate no significant findings. Lungs/Pleura: Stable mosaic ground-glass opacity in both lungs. Stable minimal atelectasis or scarring at both posterior lung bases. No pleural fluid. Upper Abdomen: Unremarkable. Musculoskeletal: Thoracic spine degenerative changes, including changes of DISH. Review of the MIP images confirms the above findings. IMPRESSION: 1. No pulmonary emboli or other acute abnormality. 2. Stable mosaic interstitial  pattern throughout both lungs compatible with air trapping due to small airway disease or possible multifocal inflammation. 3.  Calcific coronary artery and aortic atherosclerosis. Aortic Atherosclerosis (ICD10-I70.0). Electronically Signed   By: Claudie Revering M.D.   On: 08/02/2021 16:35   CT Cervical Spine Wo Contrast  Result Date: 08/02/2021 CLINICAL DATA:  An 88 year old female presents for  evaluation of neck trauma without loss of consciousness by report. EXAM: CT HEAD WITHOUT CONTRAST CT CERVICAL SPINE WITHOUT CONTRAST TECHNIQUE: Multidetector CT imaging of the head and cervical spine was performed following the standard protocol without intravenous contrast. Multiplanar CT image reconstructions of the cervical spine were also generated. RADIATION DOSE REDUCTION: This exam was performed according to the departmental dose-optimization program which includes automated exposure control, adjustment of the mA and/or kV according to patient size and/or use of iterative reconstruction technique. COMPARISON:  None FINDINGS: CT HEAD FINDINGS Brain: No evidence of acute infarction, hemorrhage, hydrocephalus, extra-axial collection or mass lesion/mass effect. Signs of atrophy and chronic microvascular ischemic change, mild. Vascular: No hyperdense vessel or unexpected calcification. Skull: Normal. Negative for fracture or focal lesion. Sinuses/Orbits: No acute finding. Other: Stranding in the scalp of the RIGHT occipital parietal region just to the RIGHT of midline. No underlying calvarial abnormality. CT CERVICAL SPINE FINDINGS Alignment: Minimal anterolisthesis of C2 on C3 approximately 1-2 mm in the setting of degenerative changes. Cervical lordotic curvature otherwise without abnormality. No overlying prevertebral soft tissue swelling. Skull base and vertebrae: No acute fracture. No primary bone lesion or focal pathologic process. Soft tissues and spinal canal: No prevertebral fluid or swelling. No visible canal hematoma. Disc levels: Multilevel degenerative changes throughout the cervical spine. Disc space narrowing at multiple levels greatest at C5-6 and C6-7. Facet arthropathy greatest in the upper cervical spine on the LEFT. Upper chest: Negative. Other: None IMPRESSION: 1. No acute intracranial abnormality. Signs of mild atrophy and chronic microvascular ischemic change. 2. Stranding/contusion in the  scalp of the RIGHT occipital parietal region just to the RIGHT of midline without underlying calvarial abnormality. 3. No acute fracture or static subluxation of the cervical spine. 4. Multilevel degenerative changes of the cervical spine. Electronically Signed   By: Zetta Bills M.D.   On: 08/02/2021 14:23   CT Thoracic Spine Wo Contrast  Result Date: 08/02/2021 CLINICAL DATA:  Back trauma after a fall. EXAM: CT THORACIC SPINE WITHOUT CONTRAST TECHNIQUE: Multidetector CT images of the thoracic were obtained using the standard protocol without intravenous contrast. RADIATION DOSE REDUCTION: This exam was performed according to the departmental dose-optimization program which includes automated exposure control, adjustment of the mA and/or kV according to patient size and/or use of iterative reconstruction technique. COMPARISON:  CT chest 06/22/2021 FINDINGS: Alignment: Normal alignment. Vertebrae: No vertebral compression deformities. No focal bone lesion or bone destruction. Paraspinal and other soft tissues: No abnormal paraspinal soft tissue mass or infiltration. Disc levels: Degenerative changes throughout the thoracic spine with narrowed interspaces and endplate osteophyte formation throughout. Prominent disc osteophyte complexes at T7-8, T8-9 and T11-12 levels cause some bone encroachment upon the central canal. Extensive ligamentous calcification along the posterior spinous processes. IMPRESSION: 1. No acute displaced fractures are identified. 2. Prominent degenerative changes throughout. Electronically Signed   By: Lucienne Capers M.D.   On: 08/02/2021 18:20   CT Lumbar Spine Wo Contrast  Result Date: 08/02/2021 CLINICAL DATA:  Back pain after a fall. EXAM: CT Lumbar Spine with contrast TECHNIQUE: Technique: Multiplanar CT images of the lumbar spine were reconstructed from contemporary CT  of the Abdomen and Pelvis. RADIATION DOSE REDUCTION: This exam was performed according to the departmental  dose-optimization program which includes automated exposure control, adjustment of the mA and/or kV according to patient size and/or use of iterative reconstruction technique. CONTRAST:  No additional contrast material was used. COMPARISON:  CT chest 12/22/2018 FINDINGS: Segmentation: 5 lumbar type vertebral bodies. Alignment: Mild anterior subluxation of L5 on S1. Otherwise normal alignment of the lumbar spine and posterior elements. Vertebrae: No vertebral compression deformities. No focal bone lesion or bone destruction. Bone cortex appears intact. Paraspinal and other soft tissues: No abnormal paraspinal soft tissue swelling or mass. Fatty atrophy of the posterior paraspinal muscles. Calcification of the aorta without aneurysm. Disc levels: Degenerative changes with disc space narrowing and endplate osteophyte formation most prominent at L3-4 and L4-5 levels. Degenerative disc disease at L3-4 and L4-5. Endplate osteophyte formation and facet joint degenerative changes throughout the lumbar spine. IMPRESSION: 1. Mild anterior subluxation of L5 on S1. This is likely degenerative but without comparison studies, could indicate ligamentous injury. 2. No acute displaced fractures are identified. 3. Degenerative changes in the lumbar spine and facet joints. Electronically Signed   By: Lucienne Capers M.D.   On: 08/02/2021 18:15   MR BRAIN WO CONTRAST  Result Date: 08/02/2021 CLINICAL DATA:  Initial evaluation for acute dizziness. EXAM: MRI HEAD WITHOUT CONTRAST TECHNIQUE: Multiplanar, multiecho pulse sequences of the brain and surrounding structures were obtained without intravenous contrast. COMPARISON:  CT from earlier the same day. FINDINGS: Brain: Cerebral volume within normal limits. Mild chronic microvascular ischemic disease for age. No abnormal foci of restricted diffusion to suggest acute or subacute ischemia. Gray-white matter differentiation maintained. No areas of chronic cortical infarction. No acute  or chronic intracranial hemorrhage. 1.6 cm meningioma seen at the peripheral aspect of the right cerebellum (series 15, image 12). Meningioma mildly indents the adjacent right cerebellum without significant regional mass effect or vasogenic edema. No other mass lesion, mass effect or midline shift. No hydrocephalus or extra-axial fluid collection. Pituitary gland suprasellar region normal. Midline structures intact. Vascular: Major intracranial vascular flow voids are maintained. Skull and upper cervical spine: Craniocervical junction normal. Bone marrow signal intensity within normal limits. Soft tissue contusion present at the right parieto-occipital scalp. Sinuses/Orbits: Prior bilateral ocular lens replacement. Paranasal sinuses are largely clear. Trace bilateral mastoid effusions, of doubtful significance. Visualized nasopharynx unremarkable. Other: None. IMPRESSION: 1. No acute intracranial abnormality. 2. Soft tissue contusion at the right parieto-occipital scalp. 3. 1.6 cm meningioma at the peripheral aspect of the right cerebellum without significant mass effect. 4. Mild chronic microvascular ischemic disease for age. Electronically Signed   By: Jeannine Boga M.D.   On: 08/02/2021 22:36   CT ABDOMEN PELVIS W CONTRAST  Addendum Date: 08/02/2021   ADDENDUM REPORT: 08/02/2021 18:45 ADDENDUM: Following should be included in the impression. There is subtle increased density in the dependent portion of nondistended gallbladder. This may suggest presence of sludge or tiny gallbladder stones. There are no imaging signs of acute cholecystitis. Follow-up gallbladder sonogram as clinically warranted may be considered. Electronically Signed   By: Elmer Picker M.D.   On: 08/02/2021 18:45   Result Date: 08/02/2021 CLINICAL DATA:  Trauma, right chest pain EXAM: CT ABDOMEN AND PELVIS WITH CONTRAST TECHNIQUE: Multidetector CT imaging of the abdomen and pelvis was performed using the standard protocol  following bolus administration of intravenous contrast. RADIATION DOSE REDUCTION: This exam was performed according to the departmental dose-optimization program which includes automated exposure control, adjustment  of the mA and/or kV according to patient size and/or use of iterative reconstruction technique. CONTRAST:  26mL OMNIPAQUE IOHEXOL 300 MG/ML  SOLN COMPARISON:  None. FINDINGS: Lower chest: Small linear densities seen in both lower lung fields. Heart is enlarged in size. There are scattered coronary artery calcifications. Dense calcification is seen in the mitral annulus. Hepatobiliary: Liver measures 18.3 cm in length. There is no dilation of bile ducts. There is subtle increased density in the dependent portion of gallbladder lumen. Gallbladder is not distended. Pancreas: No focal lesions are seen. There are few small calcifications in the body and tail. Spleen: Spleen measures 12.9 cm in maximum diameter. Adrenals/Urinary Tract: Adrenals are unremarkable. There is no hydronephrosis. There is contrast in the collecting systems, ureters and urinary bladder possibly related to timing of imaging sequence. This limits evaluation for small renal stones. There are foci of cortical thinning in the kidneys. There is no perinephric fluid collection. Stomach/Bowel: Stomach is unremarkable. Small bowel loops are not dilated. Appendix is not distinctly seen. Scattered diverticula are seen in the colon without signs of focal acute diverticulitis. Vascular/Lymphatic: There is ectasia of infrarenal abdominal aorta measuring 2.9 cm in the AP diameter. There are scattered atherosclerotic plaques and calcifications in the aorta and its major branches. Reproductive: Uterus is not seen. There are no dominant adnexal masses. Other: There is no ascites or pneumoperitoneum. Umbilical hernia containing fat is seen. Small bilateral inguinal hernias containing fat seen. Musculoskeletal: Degenerative changes are noted in lumbar  spine with spinal stenosis and encroachment of neural foramina from L2-S1 levels. There is minimal anterolisthesis at L5-S1 level. IMPRESSION: There is no evidence of intestinal obstruction or pneumoperitoneum. There is no hydronephrosis. Diverticulosis of colon without focal acute diverticulitis. Enlarged liver. Enlarged spleen. Small linear densities in both lower lung fields may suggest scarring or minimal subsegmental atelectasis. There is ectasia of infrarenal abdominal aorta measuring 2.9 cm in maximum diameter. Lumbar spondylosis with spinal stenosis. Other findings as described in the body of the report. Electronically Signed: By: Elmer Picker M.D. On: 08/02/2021 18:18        Scheduled Meds:  vitamin C  1,000 mg Oral Daily   dorzolamide-timolol  1 drop Both Eyes BID   enoxaparin (LOVENOX) injection  40 mg Subcutaneous Q24H   famotidine  20 mg Oral BID   irbesartan  150 mg Oral Daily   multivitamin with minerals  1 tablet Oral Daily   tiotropium  18 mcg Inhalation Daily   Continuous Infusions:   LOS: 0 days    Time spent: 30 mins     Wyvonnia Dusky, MD [facility] Pager [phone]  If 7PM-7AM, please contact night-coverage 08/03/2021, 8:25 AM

## 2021-08-03 NOTE — Evaluation (Signed)
Physical Therapy Evaluation Patient Details Name: Lauren Morris MRN: [id number] DOB: 05-Feb-1932 Today's Date: 08/03/2021  History of Present Illness  Pt is an 88 y.o. female presenting to hospital s/p fall (stepping over a curb and fell backwards hitting back of her head); c/o headache and pain back R side of chest; also c/o new SOB.  Dizziness and vertigo-like symptoms since fall.  Imaging (-) PE.  MRI brain showing soft tissue contusion R parieto-occipital scalp and 1.6 cm meningioma at the peripheral aspect of the R cerebellum (without significant mass effect).  Pt admitted with dizziness, htn, new meningioma diagnosis, and musculoskeletal back pain.  PMH includes h/o PE, DM, htn, chronic bronchitis, and chronic LFT's.  Clinical Impression  PT/OT co-evaluation performed.  Prior to hospital admission, pt was independent with functional mobility; lives alone in home with 2 STE with R railing; family able to assist 24/7 if needed.  Pt sitting edge of bed with OT upon PT arrival (reports of dizziness with bed mobility).  Currently pt is min assist with transfers and to ambulate 12 feet with RW use (pt very cautious with mobility requiring significant extra time--pt noted with reports of dizziness looking L/R so pt given vc's to focus on object when performing mobility which improved pt's symptoms).  Pt would benefit from skilled PT to address noted impairments and functional limitations (see below for any additional details).  Upon hospital discharge, pt would benefit from SNF but may be able to discharge home with 24/7 assist.       Recommendations for follow up therapy are one component of a multi-disciplinary discharge planning process, led by the attending physician.  Recommendations may be updated based on patient status, additional functional criteria and insurance authorization.  Follow Up Recommendations Skilled nursing-short term rehab (<3 hours/day)    Assistance Recommended at Discharge  Frequent or constant Supervision/Assistance  Patient can return home with the following  A little help with walking and/or transfers;A little help with bathing/dressing/bathroom;Assistance with cooking/housework;Assist for transportation;Help with stairs or ramp for entrance    Equipment Recommendations Rolling walker (2 wheels);BSC/3in1;Wheelchair (measurements PT);Wheelchair cushion (measurements PT)  Recommendations for Other Services  OT consult    Functional Status Assessment Patient has had a recent decline in their functional status and demonstrates the ability to make significant improvements in function in a reasonable and predictable amount of time.     Precautions / Restrictions Precautions Precautions: Fall Restrictions Weight Bearing Restrictions: No      Mobility  Bed Mobility               General bed mobility comments: Deferred (pt sitting on edge of bed with OT present upon PT arrival)    Transfers Overall transfer level: Needs assistance Equipment used: Rolling walker (2 wheels), 2 person hand held assist Transfers: Sit to/from Stand, Bed to chair/wheelchair/BSC Sit to Stand: Min assist (pt requesting 2nd assist for safety)   Step pivot transfers: Min assist (stand step turn BSC to recliner with B hand hold assist)       General transfer comment: vc's for UE placement    Ambulation/Gait Ambulation/Gait assistance: Min assist Gait Distance (Feet): 12 Feet Assistive device: Rolling walker (2 wheels)   Gait velocity: significantly decreased     General Gait Details: decreased B LE step length/foot clearance/heelstrike; pt very hesitant and cautious with taking steps; vc's to focus on object to prevent dizziness  Science writer  Modified Rankin (Stroke Patients Only)       Balance Overall balance assessment: Needs assistance Sitting-balance support: No upper extremity supported, Feet supported Sitting  balance-Leahy Scale: Good Sitting balance - Comments: steady sitting reaching within BOS   Standing balance support: Single extremity supported, During functional activity Standing balance-Leahy Scale: Fair Standing balance comment: pt requiring at least single UE support for static standing balance                             Pertinent Vitals/Pain Pain Assessment Pain Assessment: 0-10 Pain Score: 7  Pain Location: R posterior flank Pain Descriptors / Indicators: Discomfort, Dull Pain Intervention(s): Limited activity within patient's tolerance, Monitored during session, Repositioned    Home Living Family/patient expects to be discharged to:: Private residence Living Arrangements: Alone Available Help at Discharge: Family;Available 24 hours/day Type of Home: House Home Access: Stairs to enter Entrance Stairs-Rails: Right Entrance Stairs-Number of Steps: 2   Home Layout: One level Home Equipment: Conservation officer, nature (2 wheels);Cane - single point;Shower seat - built in Additional Comments: Pt's family reporting able to provide 24/7 assist at discharge.  No other recent falls.    Prior Function Prior Level of Function : Independent/Modified Independent;Driving             Mobility Comments: No recent AD use (used RW after COVID last year).       Hand Dominance   Dominant Hand: Right    Extremity/Trunk Assessment   Upper Extremity Assessment Upper Extremity Assessment: Overall WFL for tasks assessed    Lower Extremity Assessment Lower Extremity Assessment: Generalized weakness    Cervical / Trunk Assessment Cervical / Trunk Assessment: Normal  Communication   Communication: No difficulties  Cognition Arousal/Alertness: Awake/alert Behavior During Therapy: WFL for tasks assessed/performed, Anxious Overall Cognitive Status: Within Functional Limits for tasks assessed                                 General Comments: A&O x4         General Comments  Pt agreeable to PT session.    Exercises  Transfers and ambulation   Assessment/Plan    PT Assessment Patient needs continued PT services  PT Problem List Decreased strength;Decreased activity tolerance;Decreased balance;Decreased mobility;Decreased knowledge of use of DME;Decreased knowledge of precautions;Pain       PT Treatment Interventions DME instruction;Gait training;Stair training;Functional mobility training;Therapeutic activities;Therapeutic exercise;Balance training;Patient/family education;Neuromuscular re-education    PT Goals (Current goals can be found in the Care Plan section)  Acute Rehab PT Goals Patient Stated Goal: to improve mobility PT Goal Formulation: With patient Time For Goal Achievement: 08/17/21 Potential to Achieve Goals: Good    Frequency Min 2X/week     Co-evaluation PT/OT/SLP Co-Evaluation/Treatment: Yes Reason for Co-Treatment: For patient/therapist safety;To address functional/ADL transfers PT goals addressed during session: Mobility/safety with mobility OT goals addressed during session: ADL's and self-care       AM-PAC PT "6 Clicks" Mobility  Outcome Measure Help needed turning from your back to your side while in a flat bed without using bedrails?: None Help needed moving from lying on your back to sitting on the side of a flat bed without using bedrails?: A Little Help needed moving to and from a bed to a chair (including a wheelchair)?: A Little Help needed standing up from a chair using your arms (e.g., wheelchair or bedside chair)?: A  Little Help needed to walk in hospital room?: A Little Help needed climbing 3-5 steps with a railing? : A Little 6 Click Score: 19    End of Session Equipment Utilized During Treatment: Gait belt Activity Tolerance: Patient tolerated treatment well Patient left: in chair;with call bell/phone within reach;with chair alarm set;with family/visitor present Nurse Communication:  Mobility status;Precautions;Other (comment) (via white board) PT Visit Diagnosis: Unsteadiness on feet (R26.81);Other abnormalities of gait and mobility (R26.89);Muscle weakness (generalized) (M62.81);History of falling (Z91.81);Pain    Time: 7001-7494 PT Time Calculation (min) (ACUTE ONLY): 31 min   Charges:   PT Evaluation $PT Eval Low Complexity: 1 Low PT Treatments $Gait Training: 8-22 mins       Leitha Bleak, PT 08/03/21, 11:43 AM

## 2021-08-03 NOTE — Assessment & Plan Note (Signed)
-   Norco 5-3 25, 1 tablet p.o. every 6 hours as needed for moderate pain; morphine 0.5 mg IV every 4 hours as needed for severe pain ordered - PT, OT

## 2021-08-04 ENCOUNTER — Inpatient Hospital Stay (HOSPITAL_COMMUNITY)
Admit: 2021-08-04 | Discharge: 2021-08-04 | Disposition: A | Discharge status: Home / Self Care | Payer: Medicare Other | Attending: Student in an Organized Health Care Education/Training Program | Admitting: Student in an Organized Health Care Education/Training Program

## 2021-08-04 DIAGNOSIS — I10 Essential (primary) hypertension: Secondary | ICD-10-CM

## 2021-08-04 DIAGNOSIS — R0609 Other forms of dyspnea: Secondary | ICD-10-CM | POA: Diagnosis not present

## 2021-08-04 LAB — COMPREHENSIVE METABOLIC PANEL
ALT: 50 U/L — ABNORMAL HIGH (ref 0–44)
AST: 46 U/L — ABNORMAL HIGH (ref 15–41)
Albumin: 3.1 g/dL — ABNORMAL LOW (ref 3.5–5.0)
Alkaline Phosphatase: 454 U/L — ABNORMAL HIGH (ref 38–126)
Anion gap: 6 (ref 5–15)
BUN: 16 mg/dL (ref 8–23)
CO2: 26 mmol/L (ref 22–32)
Calcium: 8.9 mg/dL (ref 8.9–10.3)
Chloride: 104 mmol/L (ref 98–111)
Creatinine, Ser: 0.59 mg/dL (ref 0.44–1.00)
GFR, Estimated: >60 mL/min (ref >60)
Glucose, Bld: 110 mg/dL — ABNORMAL HIGH (ref 70–99)
Potassium: 3.9 mmol/L (ref 3.5–5.1)
Sodium: 136 mmol/L (ref 135–145)
Total Bilirubin: 0.6 mg/dL (ref 0.3–1.2)
Total Protein: 6.9 g/dL (ref 6.5–8.1)

## 2021-08-04 LAB — CBC
HCT: 43.9 % (ref 36.0–46.0)
Hemoglobin: 14.2 g/dL (ref 12.0–15.0)
MCH: 29.6 pg (ref 26.0–34.0)
MCHC: 32.3 g/dL (ref 30.0–36.0)
MCV: 91.6 fL (ref 80.0–100.0)
Platelets: 218 10*3/uL (ref 150–400)
RBC: 4.79 MIL/uL (ref 3.87–5.11)
RDW: 13.5 % (ref 11.5–15.5)
WBC: 7.6 10*3/uL (ref 4.0–10.5)
nRBC: 0 % (ref 0.0–0.2)

## 2021-08-04 LAB — ECHOCARDIOGRAM COMPLETE
AR max vel: 2.01 cm2
AV Area VTI: 2.3 cm2
AV Area mean vel: 2.04 cm2
AV Mean grad: 5 mmHg
AV Peak grad: 10.4 mmHg
Ao pk vel: 1.61 m/s
Area-P 1/2: 2.8 cm2
Height: 62 in
MV VTI: 2.31 cm2
S' Lateral: 2.41 cm
Weight: 2550.28 oz

## 2021-08-04 MED ORDER — PERFLUTREN LIPID MICROSPHERE
1.0000 mL | INTRAVENOUS | Status: AC | PRN
  Administered 2021-08-04: 2 mL via INTRAVENOUS
  Filled 2021-08-04: qty 10

## 2021-08-04 MED ORDER — HYDROCODONE-ACETAMINOPHEN 5-325 MG PO TABS
1.0000 | ORAL_TABLET | Freq: Four times a day (QID) | ORAL | Status: DC | PRN
Start: 2021-08-04 — End: 2021-08-09
  Administered 2021-08-04 – 2021-08-08 (×6): 1 via ORAL
  Filled 2021-08-04 (×7): qty 1

## 2021-08-04 MED ORDER — DOCUSATE SODIUM 100 MG PO CAPS
200.0000 mg | ORAL_CAPSULE | Freq: Two times a day (BID) | ORAL | Status: DC
  Administered 2021-08-04 – 2021-08-07 (×8): 200 mg via ORAL
  Filled 2021-08-04 (×9): qty 2

## 2021-08-04 MED ORDER — MORPHINE SULFATE (PF) 2 MG/ML IV SOLN
1.0000 mg | INTRAVENOUS | Status: DC | PRN
  Administered 2021-08-07: 1 mg via INTRAVENOUS
  Filled 2021-08-04: qty 1

## 2021-08-04 MED ORDER — POLYETHYLENE GLYCOL 3350 17 G PO PACK
17.0000 g | PACK | Freq: Every day | ORAL | Status: DC
  Administered 2021-08-04 – 2021-08-06 (×3): 17 g via ORAL
  Filled 2021-08-04 (×5): qty 1

## 2021-08-04 NOTE — Progress Notes (Signed)
*  PRELIMINARY RESULTS* Echocardiogram 2D Echocardiogram has been performed.  Wallie Char Cailan Antonucci 08/04/2021, 8:15 AM

## 2021-08-04 NOTE — Progress Notes (Signed)
Physical Therapy Treatment Patient Details Name: Lauren Morris MRN: [id number] DOB: 05-29-1932 Today's Date: 08/04/2021   History of Present Illness Pt is an 88 y.o. female presenting to hospital s/p fall (stepping over a curb and fell backwards hitting back of her head); c/o headache and pain back R side of chest; also c/o new SOB.  Dizziness and vertigo-like symptoms since fall.  Imaging (-) PE.  MRI brain showing soft tissue contusion R parieto-occipital scalp and 1.6 cm meningioma at the peripheral aspect of the R cerebellum (without significant mass effect).  Pt admitted with dizziness, htn, new meningioma diagnosis, and musculoskeletal back pain.  PMH includes h/o PE, DM, htn, chronic bronchitis, and chronic LFT's.    PT Comments    Pt resting in bed upon PT arrival (pt reports no recent food); pt's daughter present entire session.  Pt and pt's daughter educated on purpose and technique of Dix-Hallpike test; symptoms; precautions and contraindications; and also Epley maneuver purpose and technique if indicated: pt agreeable to above testing and treatment.  Cervical ROM screened (of note, decreased L cervical rotation noted compared to R).  Positive left Dix-Hallpike test so Epley maneuver performed.  Pt's symptoms most significant for feelings of "falling" with each position change and nystagmus also noted.  Unable to finish Epley 1st attempt d/t when attempting to sit pt up on edge of bed pt suddenly pushing back (d/t significant feelings of "falling") requiring assist of therapist to safely lay pt back down in bed until symptoms resolved.  After rest break performed Dix-Hallpike test (positive L again) and Epley maneuver attempted again with OT assisting (pt still with feelings of "falling" with each position change and nystagmus still noted) although symptoms appeared improved compared to 1st trial except pt still pushing back (d/t feeling she was "falling") when sitting up on edge of bed (able to  safely sit up with 2 assist).  Pt's symptoms resolved sitting edge of bed but pt appearing sweaty so cool wet washcloth utilized for pt's forehead and neck to improve symptoms.  Educated pt on positioning after treatment and precautions.  End of session, OT present with pt.  Will continue to monitor pt's symptoms and progress therapy per pt tolerance.   Recommendations for follow up therapy are one component of a multi-disciplinary discharge planning process, led by the attending physician.  Recommendations may be updated based on patient status, additional functional criteria and insurance authorization.  Follow Up Recommendations  Skilled nursing-short term rehab (<3 hours/day)     Assistance Recommended at Discharge Frequent or constant Supervision/Assistance  Patient can return home with the following A little help with walking and/or transfers;A little help with bathing/dressing/bathroom;Assistance with cooking/housework;Assist for transportation;Help with stairs or ramp for entrance   Equipment Recommendations  Rolling walker (2 wheels);BSC/3in1;Wheelchair (measurements PT);Wheelchair cushion (measurements PT)    Recommendations for Other Services OT consult     Precautions / Restrictions Precautions Precautions: Fall Restrictions Weight Bearing Restrictions: No     Mobility  Bed Mobility Overal bed mobility: Needs Assistance Bed Mobility: Rolling, Sidelying to Sit Rolling: Min assist (assist d/t feelings of "falling") Sidelying to sit: Max assist, +2 for safety/equipment (d/t feelings of "falling" and pt pushing back towards bed)            Transfers                   General transfer comment: Deferred (pt with OT end of session)    Ambulation/Gait  Stairs             Wheelchair Mobility    Modified Rankin (Stroke Patients Only)       Balance Overall balance assessment: Needs assistance Sitting-balance support:  Bilateral upper extremity supported, Feet supported Sitting balance-Leahy Scale: Good Sitting balance - Comments: steady sitting reaching within BOS (once feelings of "falling" gone)                                    Cognition Arousal/Alertness: Awake/alert Behavior During Therapy: WFL for tasks assessed/performed, Anxious Overall Cognitive Status: Within Functional Limits for tasks assessed                                 General Comments: A&O x4        Exercises      General Comments  Nursing cleared pt for participation in physical therapy.  Pt agreeable to PT session.       Pertinent Vitals/Pain Pain Assessment Pain Assessment: Faces Faces Pain Scale: Hurts little more Pain Location: R flank Pain Descriptors / Indicators: Discomfort, Dull Pain Intervention(s): Limited activity within patient's tolerance, Monitored during session, Repositioned    Home Living                          Prior Function            PT Goals (current goals can now be found in the care plan section) Acute Rehab PT Goals Patient Stated Goal: to improve mobility PT Goal Formulation: With patient Time For Goal Achievement: 08/17/21 Potential to Achieve Goals: Good Progress towards PT goals: Progressing toward goals    Frequency    Min 2X/week      PT Plan Current plan remains appropriate    Co-evaluation PT/OT/SLP Co-Evaluation/Treatment: Yes   PT goals addressed during session: Mobility/safety with mobility;Other (comment) (vestibular) OT goals addressed during session: Other (comment) (vestibular)      AM-PAC PT "6 Clicks" Mobility   Outcome Measure  Help needed turning from your back to your side while in a flat bed without using bedrails?: None Help needed moving from lying on your back to sitting on the side of a flat bed without using bedrails?: A Lot Help needed moving to and from a bed to a chair (including a wheelchair)?: A  Little Help needed standing up from a chair using your arms (e.g., wheelchair or bedside chair)?: A Little Help needed to walk in hospital room?: A Little Help needed climbing 3-5 steps with a railing? : A Little 6 Click Score: 18    End of Session   Activity Tolerance: Other (comment) (limited d/t pt's feelings of "falling" with bed mobility) Patient left:  (sitting edge of bed with OT present end of session) Nurse Communication: Mobility status;Precautions PT Visit Diagnosis: Unsteadiness on feet (R26.81);Other abnormalities of gait and mobility (R26.89);Muscle weakness (generalized) (M62.81);History of falling (Z91.81);Pain     Time: 1230-1320 PT Time Calculation (min) (ACUTE ONLY): 50 min  Charges:  $[place] Rep Proc: 38-52 mins                    Leitha Bleak, PT 08/04/21, 2:23 PM

## 2021-08-04 NOTE — Progress Notes (Signed)
Occupational Therapy Treatment Patient Details Name: Lauren Morris MRN: [id number] DOB: 08/27/1931 Today's Date: 08/04/2021   History of present illness Pt is an 88 y.o. female presenting to hospital s/p fall (stepping over a curb and fell backwards hitting back of her head); c/o headache and pain back R side of chest; also c/o new SOB.  Dizziness and vertigo-like symptoms since fall.  Imaging (-) PE.  MRI brain showing soft tissue contusion R parieto-occipital scalp and 1.6 cm meningioma at the peripheral aspect of the R cerebellum (without significant mass effect).  Pt admitted with dizziness, htn, new meningioma diagnosis, and musculoskeletal back pain.  PMH includes h/o PE, DM, htn, chronic bronchitis, and chronic LFT's.   OT comments  Lauren Morris was seen for OT treatment, overlapping with PT for safe epley maneuver. Upon arrival to room pt reclined in bed with PT. Upon completion of epley maneuver PT left and family present for OT session. Pt requires MAX A don B socks seated on BSC - pt endorses dizziness with attempts to lean forward for LB access. MIN A + RW for BSC t/f and CGA perihygiene in standing. MIN A clothing mgmt in standing from [facility], assist for pulling up over rear. Pt making good progress toward goals. Pt continues to benefit from skilled OT services to maximize return to PLOF and minimize risk of future falls, injury, caregiver burden, and readmission. Will continue to follow POC. Discharge recommendation remains appropriate.     Recommendations for follow up therapy are one component of a multi-disciplinary discharge planning process, led by the attending physician.  Recommendations may be updated based on patient status, additional functional criteria and insurance authorization.    Follow Up Recommendations  Skilled nursing-short term rehab (<3 hours/day)    Assistance Recommended at Discharge Frequent or constant Supervision/Assistance  Patient can return home with the  following  A lot of help with bathing/dressing/bathroom;A lot of help with walking and/or transfers;Assistance with cooking/housework;Help with stairs or ramp for entrance;Assist for transportation   Equipment Recommendations  BSC/3in1    Recommendations for Other Services      Precautions / Restrictions Precautions Precautions: Fall Restrictions Weight Bearing Restrictions: No       Mobility Bed Mobility Overal bed mobility: Needs Assistance Bed Mobility: Rolling, Sidelying to Sit Rolling: Min assist Sidelying to sit: Max assist, +2 for safety/equipment            Transfers Overall transfer level: Needs assistance Equipment used: Rolling walker (2 wheels), 2 person hand held assist Transfers: Sit to/from Stand, Bed to chair/wheelchair/BSC Sit to Stand: Min assist     Step pivot transfers: Min assist     General transfer comment: pt anxious re: mobility     Balance Overall balance assessment: Needs assistance Sitting-balance support: Bilateral upper extremity supported, Feet supported Sitting balance-Leahy Scale: Good Sitting balance - Comments: steady sitting reaching within BOS (once feelings of "falling" gone)   Standing balance support: Single extremity supported, During functional activity Standing balance-Leahy Scale: Fair                             ADL either performed or assessed with clinical judgement   ADL Overall ADL's : Needs assistance/impaired                                       General ADL Comments:  MAX A don B socks seated on BSC - pt endorses dizziness with attempts to lean forward for LB access. MIN A + RW for BSC t/f and CGA perihygiene in standing. MIN A clothing mgmt in standing from [facility], assist for pulling up over rear.      Cognition Arousal/Alertness: Awake/alert Behavior During Therapy: WFL for tasks assessed/performed, Anxious Overall Cognitive Status: Within Functional Limits for tasks assessed                                  General Comments: A&O x4                   Pertinent Vitals/ Pain       Pain Assessment Pain Assessment: Faces Faces Pain Scale: Hurts little more Pain Location: R flank Pain Descriptors / Indicators: Discomfort, Dull Pain Intervention(s): Limited activity within patient's tolerance   Frequency  Min 2X/week        Progress Toward Goals  OT Goals(current goals can now be found in the care plan section)  Progress towards OT goals: Progressing toward goals  Acute Rehab OT Goals Patient Stated Goal: to get better OT Goal Formulation: With patient/family Time For Goal Achievement: 08/17/21 Potential to Achieve Goals: Good ADL Goals Pt Will Perform Grooming: with set-up;with supervision;standing Pt Will Perform Lower Body Dressing: with min guard assist;sit to/from stand Pt Will Transfer to Toilet: with set-up;with supervision;ambulating;regular height toilet  Plan Discharge plan remains appropriate;Frequency remains appropriate    Co-evaluation    PT/OT/SLP Co-Evaluation/Treatment: Yes Reason for Co-Treatment: For patient/therapist safety;To address functional/ADL transfers PT goals addressed during session: Mobility/safety with mobility OT goals addressed during session: ADL's and self-care      AM-PAC OT "6 Clicks" Daily Activity     Outcome Measure   Help from another person eating meals?: None Help from another person taking care of personal grooming?: A Lot Help from another person toileting, which includes using toliet, bedpan, or urinal?: A Little Help from another person bathing (including washing, rinsing, drying)?: A Little Help from another person to put on and taking off regular upper body clothing?: A Little Help from another person to put on and taking off regular lower body clothing?: A Lot 6 Click Score: 17    End of Session Equipment Utilized During Treatment: Rolling walker (2 wheels);Gait  belt  OT Visit Diagnosis: Other abnormalities of gait and mobility (R26.89);Muscle weakness (generalized) (M62.81)   Activity Tolerance Patient tolerated treatment well   Patient Left in chair;with call bell/phone within reach;with chair alarm set;with family/visitor present   Nurse Communication Mobility status        Time: 5093-2671 OT Time Calculation (min): 40 min  Charges: OT General Charges $OT Visit: 1 Visit OT Treatments $Self Care/Home Management : 23-37 mins  Dessie Coma, M.S. OTR/L  08/04/21, 3:47 PM  ascom [phone]

## 2021-08-04 NOTE — Progress Notes (Signed)
PT Cancellation Note  Patient Details Name: Lauren Morris MRN: [id number] DOB: 1932/06/08   Cancelled Treatment:    Reason Eval/Treat Not Completed: Other (comment).  PT consult received "Please do Dix-Hallpike maneuver and if any evidence of BPPV,you can also perform Epley maneuver".  Upon PT arrival to pt's room, pt already eating breakfast.  Will re-attempt later today when pt has not recently eaten (d/t concerns for nausea/emesis symptoms).   Leitha Bleak, PT 08/04/21, 8:40 AM

## 2021-08-04 NOTE — Plan of Care (Signed)

## 2021-08-04 NOTE — Progress Notes (Signed)
PROGRESS NOTE    Lauren Morris  YKD:[id number] DOB: September 09, 1931 DOA: 08/02/2021 PCP: [name], MD   Assessment & Plan:   Principal Problem:   Dizziness Active Problems:   Essential hypertension   Acquired hypothyroidism   Fatty liver disease, nonalcoholic   Chronic bronchitis (HCC)   Weakness   Vertigo   Do not resuscitate status   Musculoskeletal back pain   Meningioma (HCC)   Dizziness: etiology unclear, possibly orthostatic. Positive orthostatic vitals. Dix-Hallpike will be done by therapy today.  MRI of the brain was positive for soft tissue contusion of the right parietal occipital scalp and 1.6 cm meningioma at the peripheral aspect of the right cerebellum without significant mass effect. CT head shows no acute intracranial abnormality, right posterior fossa meningoma & right posterior scalp contusion. CTA head shows no large vessel occlusion. Echo shows EF 39-76%, grade I diastolic dysfunction, & no atrial level shunt detected   HTN: continue on irbesartan. Continue to hold BB secondary to bradycardia   Hypothyroidism: not currently on thyroid medication secondary to it causing hepatic steatosis as per pt's daughter   Transaminitis: etiology unclear. Trending down    Meningioma: new dx. Will need outpatient f/u    Musculoskeletal back pain: continue on norco, morphine prn. PT/OT recs HH   DVT prophylaxis: lovenox  Code Status: full  Family Communication: discussed pt's care w/ pt's daughter at bedside and answered her questions  Disposition Plan: likely d/c back home   Level of care: Med-Surg  Status is: Inpatient Remains inpatient appropriate because: severity of illness      Consultants:  Neuro   Procedures:   Antimicrobials:    Subjective: Pt c/o fatigue and dizziness  Objective: Vitals:   08/03/21 1606 08/03/21 2021 08/04/21 0322 08/04/21 0821  BP: (!) 163/64 139/65 137/65 (!) 151/62  Pulse: 63 (!) 56 (!) 58 60  Resp: 16 16 15 17   Temp:  98.1 F (36.7 C) 98.4 F (36.9 C) 97.6 F (36.4 C) 98.1 F (36.7 C)  TempSrc:      SpO2: 94% 95% 95% 97%  Weight:      Height:        Intake/Output Summary (Last 24 hours) at 08/04/2021 1519 Last data filed at 08/04/2021 1100 Gross per 24 hour  Intake 240 ml  Output 1200 ml  Net -960 ml   Filed Weights   08/02/21 1319  Weight: 72.3 kg    Examination:  General exam: Appears comfortable  Respiratory system: clear breath sounds b/l  Cardiovascular system:  S1/S2+. No rubs or clicks   Gastrointestinal system: Abd is soft, NT, ND & hypoactive bowel sounds Central nervous system: Alert and oriented. Moves all extremities  Psychiatry: Judgement and insight appears normal. Flat mood and affect     Data Reviewed: I have personally reviewed following labs and imaging studies  CBC: Recent Labs  Lab 08/02/21 1454 08/03/21 0354 08/04/21 0640  WBC 9.1 9.5 7.6  NEUTROABS 5.5  --   --   HGB 14.9 13.5 14.2  HCT 46.1* 42.5 43.9  MCV 91.5 92.0 91.6  PLT 250 233 734   Basic Metabolic Panel: Recent Labs  Lab 08/02/21 1454 08/03/21 0354 08/04/21 0640  NA 137 137 136  K 4.5 4.6 3.9  CL 103 105 104  CO2 24 27 26   GLUCOSE 160* 115* 110*  BUN 17 18 16   CREATININE 0.89 0.89 0.59  CALCIUM 8.9 9.0 8.9  MG 2.1  --   --  PHOS 3.5  --   --    GFR: Estimated Creatinine Clearance: 46.1 mL/min (by C-G formula based on SCr of 0.59 mg/dL). Liver Function Tests: Recent Labs  Lab 08/02/21 1454 08/04/21 0640  AST 81* 46*  ALT 66* 50*  ALKPHOS 531* 454*  BILITOT 1.3* 0.6  PROT 7.6 6.9  ALBUMIN 3.5 3.1*   No results for input(s): LIPASE, AMYLASE in the last 168 hours. No results for input(s): AMMONIA in the last 168 hours. Coagulation Profile: No results for input(s): INR, PROTIME in the last 168 hours. Cardiac Enzymes: No results for input(s): CKTOTAL, CKMB, CKMBINDEX, TROPONINI in the last 168 hours. BNP (last 3 results) No results for input(s): PROBNP in the last 8760  hours. HbA1C: No results for input(s): HGBA1C in the last 72 hours. CBG: No results for input(s): GLUCAP in the last 168 hours. Lipid Profile: No results for input(s): CHOL, HDL, LDLCALC, TRIG, CHOLHDL, LDLDIRECT in the last 72 hours. Thyroid Function Tests: No results for input(s): TSH, T4TOTAL, FREET4, T3FREE, THYROIDAB in the last 72 hours. Anemia Panel: Recent Labs    08/02/21 2226  VITAMINB12 294   Sepsis Labs: No results for input(s): PROCALCITON, LATICACIDVEN in the last 168 hours.  Recent Results (from the past 240 hour(s))  Resp Panel by RT-PCR (Flu A&B, Covid) Nasopharyngeal Swab     Status: None   Collection Time: 08/02/21  2:54 PM   Specimen: Nasopharyngeal Swab; Nasopharyngeal(NP) swabs in vial transport medium  Result Value Ref Range Status   SARS Coronavirus 2 by RT PCR NEGATIVE NEGATIVE Final    Comment: (NOTE) SARS-CoV-2 target nucleic acids are NOT DETECTED.  The SARS-CoV-2 RNA is generally detectable in upper respiratory specimens during the acute phase of infection. The lowest concentration of SARS-CoV-2 viral copies this assay can detect is 138 copies/mL. A negative result does not preclude SARS-Cov-2 infection and should not be used as the sole basis for treatment or other patient management decisions. A negative result may occur with  improper specimen collection/handling, submission of specimen other than nasopharyngeal swab, presence of viral mutation(s) within the areas targeted by this assay, and inadequate number of viral copies(<138 copies/mL). A negative result must be combined with clinical observations, patient history, and epidemiological information. The expected result is Negative.  Fact Sheet for Patients:  EntrepreneurPulse.com.au  Fact Sheet for Healthcare Providers:  IncredibleEmployment.be  This test is no t yet approved or cleared by the [place] FDA and  has been authorized for detection  and/or diagnosis of SARS-CoV-2 by FDA under an Emergency Use Authorization (EUA). This EUA will remain  in effect (meaning this test can be used) for the duration of the COVID-19 declaration under Section 564(b)(1) of the Act, 21 U.S.C.section 360bbb-3(b)(1), unless the authorization is terminated  or revoked sooner.       Influenza A by PCR NEGATIVE NEGATIVE Final   Influenza B by PCR NEGATIVE NEGATIVE Final    Comment: (NOTE) The Xpert Xpress SARS-CoV-2/FLU/RSV plus assay is intended as an aid in the diagnosis of influenza from Nasopharyngeal swab specimens and should not be used as a sole basis for treatment. Nasal washings and aspirates are unacceptable for Xpert Xpress SARS-CoV-2/FLU/RSV testing.  Fact Sheet for Patients: EntrepreneurPulse.com.au  Fact Sheet for Healthcare Providers: IncredibleEmployment.be  This test is not yet approved or cleared by the [place] FDA and has been authorized for detection and/or diagnosis of SARS-CoV-2 by FDA under an Emergency Use Authorization (EUA). This EUA will remain in effect (meaning this  test can be used) for the duration of the COVID-19 declaration under Section 564(b)(1) of the Act, 21 U.S.C. section 360bbb-3(b)(1), unless the authorization is terminated or revoked.  Performed at [facility], [address] [place], [place] [address]          Radiology Studies: CT ANGIO HEAD NECK W WO CM  Result Date: 08/03/2021 CLINICAL DATA:  Vertigo, central EXAM: CT ANGIOGRAPHY HEAD AND NECK TECHNIQUE: Multidetector CT imaging of the head and neck was performed using the standard protocol during bolus administration of intravenous contrast. Multiplanar CT image reconstructions and MIPs were obtained to evaluate the vascular anatomy. Carotid stenosis measurements (when applicable) are obtained utilizing NASCET criteria, using the distal internal carotid diameter as the denominator.  RADIATION DOSE REDUCTION: This exam was performed according to the departmental dose-optimization program which includes automated exposure control, adjustment of the mA and/or kV according to patient size and/or use of iterative reconstruction technique. CONTRAST:  5mL OMNIPAQUE IOHEXOL 350 MG/ML SOLN COMPARISON:  None. FINDINGS: CT HEAD FINDINGS Brain: No evidence of acute infarction, hemorrhage, hydrocephalus, or extra-axial fluid collection. Right posterior fossa meningioma, better characterized on recent MRI. Vascular: See below. Skull: Posterior scalp contusion without acute fracture. Sinuses: Left frontoethmoidal mucosal thickening/secretions. Otherwise, clear visualized sinuses. Orbits: No acute finding. Review of the MIP images confirms the above findings CTA NECK FINDINGS Aortic arch: Great vessel origins are patent. Right carotid system: Patent. Atherosclerosis at the carotid bifurcation without greater than 50% narrowing relative to the distal vessel. Left carotid system: Patent. Atherosclerosis at the carotid bifurcation without greater than 50% stenosis relative to the distal vessel. Vertebral arteries: Right dominant. Patent bilaterally without significant (greater than 50%) stenosis. Skeleton: Multilevel degenerative disc disease and facet arthropathy with varying degrees of neural foraminal stenosis. Other neck: No evidence of acute abnormality on limited assessment. Upper chest: Expiratory changes in the visualized lung apices. No consolidation. Review of the MIP images confirms the above findings CTA HEAD FINDINGS Anterior circulation: Atherosclerosis of bilateral intracranial ICAs with mild resulting narrowing. Bilateral MCAs and ACAs are patent without proximal hemodynamically significant stenosis. No aneurysm identified. Posterior circulation: Bilateral vertebral arteries, basilar artery, and posterior cerebral arteries are patent without proximal hemodynamically significant stenosis.  Bilateral PICA and superior cerebellar arteries are patent. No aneurysm identified. Venous sinuses: As permitted by contrast timing, patent. Review of the MIP images confirms the above findings IMPRESSION: CT head: 1. No evidence of acute intracranial abnormality. 2. Right posterior fossa meningioma, better characterized on recent MRI. 3. Right posterior scalp contusion without acute fracture. CTA: 1. No large vessel occlusion. 2. Atherosclerosis without hemodynamically significant proximal stenosis in the head or neck. Electronically Signed   By: Margaretha Sheffield M.D.   On: 08/03/2021 15:47   CT Angio Chest PE W and/or Wo Contrast  Result Date: 08/02/2021 CLINICAL DATA:  Right rib pain following a fall today. Clinical concern for pulmonary embolism. EXAM: CT ANGIOGRAPHY CHEST WITH CONTRAST TECHNIQUE: Multidetector CT imaging of the chest was performed using the standard protocol during bolus administration of intravenous contrast. Multiplanar CT image reconstructions and MIPs were obtained to evaluate the vascular anatomy. RADIATION DOSE REDUCTION: This exam was performed according to the departmental dose-optimization program which includes automated exposure control, adjustment of the mA and/or kV according to patient size and/or use of iterative reconstruction technique. CONTRAST:  14mL OMNIPAQUE IOHEXOL 350 MG/ML SOLN COMPARISON:  06/22/2021.  Chest radiographs obtained earlier today. FINDINGS: Cardiovascular: Stable mildly enlarged heart. Normally opacified pulmonary arteries with no  pulmonary arterial filling defects seen. Atheromatous calcifications, including the coronary arteries and aorta. Stable borderline enlarged central pulmonary arteries. Mediastinum/Nodes: No enlarged mediastinal, hilar, or axillary lymph nodes. Thyroid gland, trachea, and esophagus demonstrate no significant findings. Lungs/Pleura: Stable mosaic ground-glass opacity in both lungs. Stable minimal atelectasis or scarring at both  posterior lung bases. No pleural fluid. Upper Abdomen: Unremarkable. Musculoskeletal: Thoracic spine degenerative changes, including changes of DISH. Review of the MIP images confirms the above findings. IMPRESSION: 1. No pulmonary emboli or other acute abnormality. 2. Stable mosaic interstitial pattern throughout both lungs compatible with air trapping due to small airway disease or possible multifocal inflammation. 3.  Calcific coronary artery and aortic atherosclerosis. Aortic Atherosclerosis (ICD10-I70.0). Electronically Signed   By: Claudie Revering M.D.   On: 08/02/2021 16:35   CT Thoracic Spine Wo Contrast  Result Date: 08/02/2021 CLINICAL DATA:  Back trauma after a fall. EXAM: CT THORACIC SPINE WITHOUT CONTRAST TECHNIQUE: Multidetector CT images of the thoracic were obtained using the standard protocol without intravenous contrast. RADIATION DOSE REDUCTION: This exam was performed according to the departmental dose-optimization program which includes automated exposure control, adjustment of the mA and/or kV according to patient size and/or use of iterative reconstruction technique. COMPARISON:  CT chest 06/22/2021 FINDINGS: Alignment: Normal alignment. Vertebrae: No vertebral compression deformities. No focal bone lesion or bone destruction. Paraspinal and other soft tissues: No abnormal paraspinal soft tissue mass or infiltration. Disc levels: Degenerative changes throughout the thoracic spine with narrowed interspaces and endplate osteophyte formation throughout. Prominent disc osteophyte complexes at T7-8, T8-9 and T11-12 levels cause some bone encroachment upon the central canal. Extensive ligamentous calcification along the posterior spinous processes. IMPRESSION: 1. No acute displaced fractures are identified. 2. Prominent degenerative changes throughout. Electronically Signed   By: Lucienne Capers M.D.   On: 08/02/2021 18:20   CT Lumbar Spine Wo Contrast  Result Date: 08/02/2021 CLINICAL DATA:   Back pain after a fall. EXAM: CT Lumbar Spine with contrast TECHNIQUE: Technique: Multiplanar CT images of the lumbar spine were reconstructed from contemporary CT of the Abdomen and Pelvis. RADIATION DOSE REDUCTION: This exam was performed according to the departmental dose-optimization program which includes automated exposure control, adjustment of the mA and/or kV according to patient size and/or use of iterative reconstruction technique. CONTRAST:  No additional contrast material was used. COMPARISON:  CT chest 12/22/2018 FINDINGS: Segmentation: 5 lumbar type vertebral bodies. Alignment: Mild anterior subluxation of L5 on S1. Otherwise normal alignment of the lumbar spine and posterior elements. Vertebrae: No vertebral compression deformities. No focal bone lesion or bone destruction. Bone cortex appears intact. Paraspinal and other soft tissues: No abnormal paraspinal soft tissue swelling or mass. Fatty atrophy of the posterior paraspinal muscles. Calcification of the aorta without aneurysm. Disc levels: Degenerative changes with disc space narrowing and endplate osteophyte formation most prominent at L3-4 and L4-5 levels. Degenerative disc disease at L3-4 and L4-5. Endplate osteophyte formation and facet joint degenerative changes throughout the lumbar spine. IMPRESSION: 1. Mild anterior subluxation of L5 on S1. This is likely degenerative but without comparison studies, could indicate ligamentous injury. 2. No acute displaced fractures are identified. 3. Degenerative changes in the lumbar spine and facet joints. Electronically Signed   By: Lucienne Capers M.D.   On: 08/02/2021 18:15   MR BRAIN WO CONTRAST  Result Date: 08/02/2021 CLINICAL DATA:  Initial evaluation for acute dizziness. EXAM: MRI HEAD WITHOUT CONTRAST TECHNIQUE: Multiplanar, multiecho pulse sequences of the brain and surrounding structures were obtained without intravenous  contrast. COMPARISON:  CT from earlier the same day. FINDINGS:  Brain: Cerebral volume within normal limits. Mild chronic microvascular ischemic disease for age. No abnormal foci of restricted diffusion to suggest acute or subacute ischemia. Gray-white matter differentiation maintained. No areas of chronic cortical infarction. No acute or chronic intracranial hemorrhage. 1.6 cm meningioma seen at the peripheral aspect of the right cerebellum (series 15, image 12). Meningioma mildly indents the adjacent right cerebellum without significant regional mass effect or vasogenic edema. No other mass lesion, mass effect or midline shift. No hydrocephalus or extra-axial fluid collection. Pituitary gland suprasellar region normal. Midline structures intact. Vascular: Major intracranial vascular flow voids are maintained. Skull and upper cervical spine: Craniocervical junction normal. Bone marrow signal intensity within normal limits. Soft tissue contusion present at the right parieto-occipital scalp. Sinuses/Orbits: Prior bilateral ocular lens replacement. Paranasal sinuses are largely clear. Trace bilateral mastoid effusions, of doubtful significance. Visualized nasopharynx unremarkable. Other: None. IMPRESSION: 1. No acute intracranial abnormality. 2. Soft tissue contusion at the right parieto-occipital scalp. 3. 1.6 cm meningioma at the peripheral aspect of the right cerebellum without significant mass effect. 4. Mild chronic microvascular ischemic disease for age. Electronically Signed   By: Jeannine Boga M.D.   On: 08/02/2021 22:36   CT ABDOMEN PELVIS W CONTRAST  Addendum Date: 08/02/2021   ADDENDUM REPORT: 08/02/2021 18:45 ADDENDUM: Following should be included in the impression. There is subtle increased density in the dependent portion of nondistended gallbladder. This may suggest presence of sludge or tiny gallbladder stones. There are no imaging signs of acute cholecystitis. Follow-up gallbladder sonogram as clinically warranted may be considered. Electronically Signed    By: Elmer Picker M.D.   On: 08/02/2021 18:45   Result Date: 08/02/2021 CLINICAL DATA:  Trauma, right chest pain EXAM: CT ABDOMEN AND PELVIS WITH CONTRAST TECHNIQUE: Multidetector CT imaging of the abdomen and pelvis was performed using the standard protocol following bolus administration of intravenous contrast. RADIATION DOSE REDUCTION: This exam was performed according to the departmental dose-optimization program which includes automated exposure control, adjustment of the mA and/or kV according to patient size and/or use of iterative reconstruction technique. CONTRAST:  68mL OMNIPAQUE IOHEXOL 300 MG/ML  SOLN COMPARISON:  None. FINDINGS: Lower chest: Small linear densities seen in both lower lung fields. Heart is enlarged in size. There are scattered coronary artery calcifications. Dense calcification is seen in the mitral annulus. Hepatobiliary: Liver measures 18.3 cm in length. There is no dilation of bile ducts. There is subtle increased density in the dependent portion of gallbladder lumen. Gallbladder is not distended. Pancreas: No focal lesions are seen. There are few small calcifications in the body and tail. Spleen: Spleen measures 12.9 cm in maximum diameter. Adrenals/Urinary Tract: Adrenals are unremarkable. There is no hydronephrosis. There is contrast in the collecting systems, ureters and urinary bladder possibly related to timing of imaging sequence. This limits evaluation for small renal stones. There are foci of cortical thinning in the kidneys. There is no perinephric fluid collection. Stomach/Bowel: Stomach is unremarkable. Small bowel loops are not dilated. Appendix is not distinctly seen. Scattered diverticula are seen in the colon without signs of focal acute diverticulitis. Vascular/Lymphatic: There is ectasia of infrarenal abdominal aorta measuring 2.9 cm in the AP diameter. There are scattered atherosclerotic plaques and calcifications in the aorta and its major branches.  Reproductive: Uterus is not seen. There are no dominant adnexal masses. Other: There is no ascites or pneumoperitoneum. Umbilical hernia containing fat is seen. Small bilateral inguinal hernias containing  fat seen. Musculoskeletal: Degenerative changes are noted in lumbar spine with spinal stenosis and encroachment of neural foramina from L2-S1 levels. There is minimal anterolisthesis at L5-S1 level. IMPRESSION: There is no evidence of intestinal obstruction or pneumoperitoneum. There is no hydronephrosis. Diverticulosis of colon without focal acute diverticulitis. Enlarged liver. Enlarged spleen. Small linear densities in both lower lung fields may suggest scarring or minimal subsegmental atelectasis. There is ectasia of infrarenal abdominal aorta measuring 2.9 cm in maximum diameter. Lumbar spondylosis with spinal stenosis. Other findings as described in the body of the report. Electronically Signed: By: Elmer Picker M.D. On: 08/02/2021 18:18   ECHOCARDIOGRAM COMPLETE  Result Date: 08/04/2021    ECHOCARDIOGRAM REPORT   Patient Name:   ZAMZAM WHINERY Date of Exam: 08/04/2021 Medical Rec #:  [id number]   Height:       62.0 in Accession #:    [id number]  Weight:       159.4 lb Date of Birth:  03-10-1932  BSA:          1.736 m Patient Age:    88 years    BP:           137/65 mmHg Patient Gender: F           HR:           60 bpm. Exam Location:  [facility] Procedure: 2D Echo, Color Doppler, Cardiac Doppler and Intracardiac            Opacification Agent Indications:     R06.00 Dyspnea  History:         Patient has prior history of Echocardiogram examinations. Risk                  Factors:Hypertension and Sleep Apnea.  Sonographer:     Charmayne Sheer Referring Phys:  [id number] ASHISH ARORA Diagnosing Phys: Ida Rogue MD  Sonographer Comments: Suboptimal apical window. IMPRESSIONS  1. Left ventricular ejection fraction, by estimation, is 60 to 65%. The left ventricle has normal function. The left ventricle has no  regional wall motion abnormalities. Left ventricular diastolic parameters are consistent with Grade I diastolic dysfunction (impaired relaxation).  2. Right ventricular systolic function is normal. The right ventricular size is normal. Tricuspid regurgitation signal is inadequate for assessing PA pressure.  3. The mitral valve is normal in structure. No evidence of mitral valve regurgitation. No evidence of mitral stenosis.  4. The aortic valve is normal in structure. Aortic valve regurgitation is not visualized. Aortic valve sclerosis/calcification is present, without any evidence of aortic stenosis.  5. The inferior vena cava is normal in size with greater than 50% respiratory variability, suggesting right atrial pressure of 3 mmHg. FINDINGS  Left Ventricle: Left ventricular ejection fraction, by estimation, is 60 to 65%. The left ventricle has normal function. The left ventricle has no regional wall motion abnormalities. Definity contrast agent was given IV to delineate the left ventricular  endocardial borders. The left ventricular internal cavity size was normal in size. There is no left ventricular hypertrophy. Left ventricular diastolic parameters are consistent with Grade I diastolic dysfunction (impaired relaxation). Right Ventricle: The right ventricular size is normal. No increase in right ventricular wall thickness. Right ventricular systolic function is normal. Tricuspid regurgitation signal is inadequate for assessing PA pressure. Left Atrium: Left atrial size was normal in size. Right Atrium: Right atrial size was normal in size. Pericardium: There is no evidence of pericardial effusion. Mitral Valve: The mitral valve is normal in structure. Mild  mitral annular calcification. No evidence of mitral valve regurgitation. No evidence of mitral valve stenosis. MV peak gradient, 4.1 mmHg. The mean mitral valve gradient is 2.0 mmHg. Tricuspid Valve: The tricuspid valve is normal in structure. Tricuspid valve  regurgitation is not demonstrated. No evidence of tricuspid stenosis. Aortic Valve: The aortic valve is normal in structure. Aortic valve regurgitation is not visualized. Aortic valve sclerosis/calcification is present, without any evidence of aortic stenosis. Aortic valve mean gradient measures 5.0 mmHg. Aortic valve peak  gradient measures 10.4 mmHg. Aortic valve area, by VTI measures 2.30 cm. Pulmonic Valve: The pulmonic valve was normal in structure. Pulmonic valve regurgitation is not visualized. No evidence of pulmonic stenosis. Aorta: The aortic root is normal in size and structure. Venous: The inferior vena cava is normal in size with greater than 50% respiratory variability, suggesting right atrial pressure of 3 mmHg. IAS/Shunts: No atrial level shunt detected by color flow Doppler.  LEFT VENTRICLE PLAX 2D LVIDd:         4.26 cm   Diastology LVIDs:         2.41 cm   LV e' medial:    5.44 cm/s LV PW:         0.96 cm   LV E/e' medial:  17.3 LV IVS:        0.92 cm   LV e' lateral:   5.00 cm/s LVOT diam:     1.90 cm   LV E/e' lateral: 18.8 LV SV:         79 LV SV Index:   45 LVOT Area:     2.84 cm  RIGHT VENTRICLE RV Basal diam:  3.67 cm LEFT ATRIUM             Index        RIGHT ATRIUM           Index LA diam:        2.80 cm 1.61 cm/m   RA Area:     13.90 cm LA Vol (A2C):   46.7 ml 26.90 ml/m  RA Volume:   28.00 ml  16.13 ml/m LA Vol (A4C):   40.6 ml 23.39 ml/m LA Biplane Vol: 43.4 ml 25.00 ml/m  AORTIC VALVE                     PULMONIC VALVE AV Area (Vmax):    2.01 cm      PV Vmax:       0.70 m/s AV Area (Vmean):   2.04 cm      PV Vmean:      52.000 cm/s AV Area (VTI):     2.30 cm      PV VTI:        0.139 m AV Vmax:           161.00 cm/s   PV Peak grad:  1.9 mmHg AV Vmean:          106.000 cm/s  PV Mean grad:  1.0 mmHg AV VTI:            0.343 m AV Peak Grad:      10.4 mmHg AV Mean Grad:      5.0 mmHg LVOT Vmax:         114.00 cm/s LVOT Vmean:        76.400 cm/s LVOT VTI:          0.278 m LVOT/AV  VTI ratio: 0.81  AORTA Ao Root diam: 2.60 cm MITRAL  VALVE MV Area (PHT): 2.80 cm    SHUNTS MV Area VTI:   2.31 cm    Systemic VTI:  0.28 m MV Peak grad:  4.1 mmHg    Systemic Diam: 1.90 cm MV Mean grad:  2.0 mmHg MV Vmax:       1.01 m/s MV Vmean:      61.4 cm/s MV Decel Time: 271 msec MV E velocity: 94.10 cm/s MV A velocity: 92.40 cm/s MV E/A ratio:  1.02 Ida Rogue MD Electronically signed by Ida Rogue MD Signature Date/Time: 08/04/2021/12:38:38 PM    Final         Scheduled Meds:  vitamin C  1,000 mg Oral Daily   docusate sodium  200 mg Oral BID   dorzolamide-timolol  1 drop Both Eyes BID   enoxaparin (LOVENOX) injection  40 mg Subcutaneous Q24H   famotidine  20 mg Oral BID   irbesartan  150 mg Oral Daily   multivitamin with minerals  1 tablet Oral Daily   polyethylene glycol  17 g Oral Daily   tiotropium  18 mcg Inhalation Daily   Continuous Infusions:   LOS: 1 day    Time spent: 25 mins     Wyvonnia Dusky, MD [facility] Pager [phone]  If 7PM-7AM, please contact night-coverage 08/04/2021, 3:19 PM

## 2021-08-04 NOTE — Progress Notes (Signed)
Neurology Progress Note   S:// Seen and examined.  No dizziness while laying down but feels dizzy when she stands up from a sitting position. Orthostatic vitals with a 25 point drop in systolic blood pressure from sitting to standing.   O:// Current vital signs: BP (!) 151/62 (BP Location: Right Arm)    Pulse 60    Temp 98.1 F (36.7 C)    Resp 17    Ht 5\' 2"  (1.575 m)    Wt 72.3 kg    SpO2 97%    BMI 29.15 kg/m  Vital signs in last 24 hours: Temp:  [97.6 F (36.4 C)-98.4 F (36.9 C)] 98.1 F (36.7 C) (02/10 0821) Pulse Rate:  [52-63] 60 (02/10 0821) Resp:  [15-17] 17 (02/10 0821) BP: (137-163)/(58-65) 151/62 (02/10 0821) SpO2:  [94 %-97 %] 97 % (02/10 0821) General: Awake alert in no distress HEENT: Normocephalic atraumatic Lungs: Clear Cardiovascular: Regular rate rhythm Abdomen nondistended nontender Extremities warm well perfused Neurological exam Awake alert oriented x3 No dysarthria No evidence of aphasia Cranial nerves II to XII intact without any evidence of nystagmus Motor examination with no drift in the bilateral upper extremities.  There is no drift in the bilateral lower extremities as well but she is mildly weaker in both extremities, somewhat due to pain. Sensation intact to light touch without extinction Coordination examination with no suggestion of dysmetria. Unchanged from yesterday  Medications  Current Facility-Administered Medications:    acetaminophen (TYLENOL) tablet 1,000 mg, 1,000 mg, Oral, Q6H PRN, 1,000 mg at 08/02/21 2023 **OR** acetaminophen (TYLENOL) suppository 650 mg, 650 mg, Rectal, Q6H PRN, Cox, Amy N, DO   ascorbic acid (VITAMIN C) tablet 1,000 mg, 1,000 mg, Oral, Daily, Cox, Amy N, DO, 1,000 mg at 08/04/21 0838   dorzolamide-timolol (COSOPT) 22.3-6.8 MG/ML ophthalmic solution 1 drop, 1 drop, Both Eyes, BID, Cox, Amy N, DO, 1 drop at 08/04/21 0838   enoxaparin (LOVENOX) injection 40 mg, 40 mg, Subcutaneous, Q24H, Cox, Amy N, DO, 40 mg at  08/03/21 2023   famotidine (PEPCID) tablet 20 mg, 20 mg, Oral, BID, Cox, Amy N, DO, 20 mg at 08/04/21 3568   hydrALAZINE (APRESOLINE) tablet 10 mg, 10 mg, Oral, Q6H PRN, Cox, Amy N, DO   irbesartan (AVAPRO) tablet 150 mg, 150 mg, Oral, Daily, Cox, Amy N, DO, 150 mg at 08/03/21 2021   multivitamin with minerals tablet 1 tablet, 1 tablet, Oral, Daily, Cox, Amy N, DO, 1 tablet at 08/04/21 0838   ondansetron (ZOFRAN) tablet 4 mg, 4 mg, Oral, Q6H PRN **OR** ondansetron (ZOFRAN) injection 4 mg, 4 mg, Intravenous, Q6H PRN, Cox, Amy N, DO   tiotropium (SPIRIVA) inhalation capsule ([facility] use ONLY) 18 mcg, 18 mcg, Inhalation, Daily, Cox, Amy N, DO, 18 mcg at 08/04/21 0838 Labs CBC    Component Value Date/Time   WBC 7.6 08/04/2021 0640   RBC 4.79 08/04/2021 0640   HGB 14.2 08/04/2021 0640   HCT 43.9 08/04/2021 0640   PLT 218 08/04/2021 0640   MCV 91.6 08/04/2021 0640   MCH 29.6 08/04/2021 0640   MCHC 32.3 08/04/2021 0640   RDW 13.5 08/04/2021 0640   LYMPHSABS 2.7 08/02/2021 1454   MONOABS 0.7 08/02/2021 1454   EOSABS 0.2 08/02/2021 1454   BASOSABS 0.1 08/02/2021 1454    CMP     Component Value Date/Time   NA 136 08/04/2021 0640   K 3.9 08/04/2021 0640   CL 104 08/04/2021 0640   CO2 26 08/04/2021 0640   GLUCOSE  110 (H) 08/04/2021 0640   BUN 16 08/04/2021 0640   CREATININE 0.59 08/04/2021 0640   CREATININE 0.72 10/22/2011 0836   CALCIUM 8.9 08/04/2021 0640   PROT 6.9 08/04/2021 0640   ALBUMIN 3.1 (L) 08/04/2021 0640   AST 46 (H) 08/04/2021 0640   ALT 50 (H) 08/04/2021 0640   ALKPHOS 454 (H) 08/04/2021 0640   BILITOT 0.6 08/04/2021 0640   GFRNONAA >60 08/04/2021 0640   GFRNONAA 81 10/22/2011 0836   GFRAA >89 10/22/2011 0836   Alkaline phosphatase 454 was 531 on admission-AST ALT mildly elevated today.    Lipid Panel     Component Value Date/Time   CHOL 156 01/14/2020 0853   TRIG 101.0 01/14/2020 0853   HDL 51.30 01/14/2020 0853   CHOLHDL 3 01/14/2020 0853   VLDL 20.2  01/14/2020 0853   LDLCALC 85 01/14/2020 0853   LDLDIRECT 80.0 04/08/2017 0905     Imaging I have reviewed images in epic and the results pertinent to this consultation are: CT angiography with no acute findings.  No evidence of vertebrobasilar stenosis. Right posterior fossa meningioma better visualized on MRI.  MRI brain negative for acute stroke.  2D echo : LVEF 60 to 70%, grade 1 diastolic dysfunction, normal RV size.  Normal mitral valve.  Normal aortic valve with some sclerosis or calcification without evidence of stenosis.  Left atrium normal in size.  No shunts.  Dix-Hallpike pending  Assessment: 88 year old presented after a fall after getting out of a restaurant where she was taking a step down and fell.  Does not remember losing consciousness.  Had decent recollection of the entire event. Continues to have dizziness more so when she gets up from bed. Orthostatic vitals suggestive of orthostatic hypotension MRI was negative for stroke-an incidental meningioma found around the right cerebellum-will need outpatient follow-up. No cardiac arrhythmias detected.  2D echo also unremarkable CT angiography of the head and neck without any evidence of vertebrobasilar stenosis to suggest vertebrobasilar insufficiency. LFTs are deranged-has a history of chronically deranged LFTs-not convinced that that has any bearing on her current clinical presentation. She is pending a Dix-Hallpike/vestibular assessment by PT. I suspect that her symptoms are likely related to orthostatic hypotension and or peripheral vertigo.  Recommendations: Await vestibular assessment per PT.  If testing positive for BPPV, will need vestibular rehab as well as meclizine as needed.  Sometimes diazepam is also very useful in peripheral vertiginous symptom management. For her orthostatic hypotension, consider midodrine, compression stockings. Outpatient cardiology and neurology follow-up, for dizziness and  syncope. Outpatient neurosurgery follow-up for the incidental finding of the meningioma. Outpatient primary care follow-up Plan was discussed with Dr. Jimmye Norman over secure chat.   Addendum  Positive Dix-Hallpike.  According to PT note, some relief with Epley. Multifactorial dizziness Can try midodrine for orthostatic hypotension as well as meclizine as needed. Discussed my plan with Dr. Jimmye Norman in person.  -- Amie Portland, MD Neurologist [facility] Pager: [phone]

## 2021-08-05 DIAGNOSIS — M549 Dorsalgia, unspecified: Secondary | ICD-10-CM

## 2021-08-05 DIAGNOSIS — H8110 Benign paroxysmal vertigo, unspecified ear: Principal | ICD-10-CM

## 2021-08-05 LAB — CBC
HCT: 45.9 % (ref 36.0–46.0)
Hemoglobin: 15 g/dL (ref 12.0–15.0)
MCH: 30.1 pg (ref 26.0–34.0)
MCHC: 32.7 g/dL (ref 30.0–36.0)
MCV: 92 fL (ref 80.0–100.0)
Platelets: 225 10*3/uL (ref 150–400)
RBC: 4.99 MIL/uL (ref 3.87–5.11)
RDW: 13.4 % (ref 11.5–15.5)
WBC: 8.6 10*3/uL (ref 4.0–10.5)
nRBC: 0 % (ref 0.0–0.2)

## 2021-08-05 LAB — COMPREHENSIVE METABOLIC PANEL
ALT: 67 U/L — ABNORMAL HIGH (ref 0–44)
AST: 75 U/L — ABNORMAL HIGH (ref 15–41)
Albumin: 3.1 g/dL — ABNORMAL LOW (ref 3.5–5.0)
Alkaline Phosphatase: 455 U/L — ABNORMAL HIGH (ref 38–126)
Anion gap: 9 (ref 5–15)
BUN: 18 mg/dL (ref 8–23)
CO2: 24 mmol/L (ref 22–32)
Calcium: 9 mg/dL (ref 8.9–10.3)
Chloride: 101 mmol/L (ref 98–111)
Creatinine, Ser: 0.88 mg/dL (ref 0.44–1.00)
GFR, Estimated: >60 mL/min (ref >60)
Glucose, Bld: 107 mg/dL — ABNORMAL HIGH (ref 70–99)
Potassium: 4.3 mmol/L (ref 3.5–5.1)
Sodium: 134 mmol/L — ABNORMAL LOW (ref 135–145)
Total Bilirubin: 1.1 mg/dL (ref 0.3–1.2)
Total Protein: 6.9 g/dL (ref 6.5–8.1)

## 2021-08-05 MED ORDER — MECLIZINE HCL 25 MG PO TABS
25.0000 mg | ORAL_TABLET | Freq: Two times a day (BID) | ORAL | Status: DC
  Administered 2021-08-05 – 2021-08-08 (×7): 25 mg via ORAL
  Filled 2021-08-05 (×9): qty 1

## 2021-08-05 MED ORDER — MECLIZINE HCL 25 MG PO TABS
25.0000 mg | ORAL_TABLET | Freq: Two times a day (BID) | ORAL | Status: DC | PRN
  Filled 2021-08-05: qty 1

## 2021-08-05 NOTE — NC FL2 (Signed)
[place] LEVEL OF CARE SCREENING TOOL     IDENTIFICATION  Patient Name: Lauren Morris Birthdate: 11-Oct-1931 Sex: female Admission Date (Current Location): 08/02/2021  [facility] and [place] Number:  Engineering geologist and Address:  [facility], [address], [place], [place] [address]      Provider Number: [id number]  Attending Physician Name and Address:  Wyvonnia Dusky, MD  Relative Name and Phone Number:  [name] (daughter) [phone]    Current Level of Care: Hospital Recommended Level of Care: [place] Prior Approval Number:    Date Approved/Denied:   PASRR Number: [id number]  Discharge Plan: SNF    Current Diagnoses: Patient Active Problem List   Diagnosis Date Noted   Dizziness 08/03/2021   Do not resuscitate status 08/03/2021   Musculoskeletal back pain 08/03/2021   Meningioma ([place]) 08/03/2021   Weakness 08/02/2021   Vertigo 08/02/2021   Pulmonary embolism and infarction ([place]) 12/12/2020   Obesity, diabetes, and hypertension syndrome ([place]) 11/24/2020   Chronic bronchitis ([place]) 01/16/2020   Abnormal EKG 07/07/2019   Generalized weakness 07/07/2019   Thyrotoxicosis due to Graves' disease 07/07/2019   Abnormal chest CT 05/25/2016   Fatty liver disease, nonalcoholic 27/25/3664   Do not resuscitate discussion 03/11/2012   Hyperlipidemia LDL goal <100 04/15/2011   Essential hypertension 02/22/2011   Acquired hypothyroidism 02/22/2011    Orientation RESPIRATION BLADDER Height & Weight     Self, Time, Situation, Place  Normal Continent, External catheter Weight: 159 lb 6.3 oz (72.3 kg) Height:  5\' 2"  (157.5 cm)  BEHAVIORAL SYMPTOMS/MOOD NEUROLOGICAL BOWEL NUTRITION STATUS      Continent Diet (see discharge summary)  AMBULATORY STATUS COMMUNICATION OF NEEDS Skin   Extensive Assist Verbally Normal                       Personal Care Assistance Level of Assistance  Feeding, Dressing, Total  care, Bathing Bathing Assistance: Limited assistance Feeding assistance: Independent Dressing Assistance: Limited assistance Total Care Assistance: Maximum assistance   Functional Limitations Info  Sight, Hearing, Speech Sight Info: Adequate Hearing Info: Adequate Speech Info: Adequate    SPECIAL CARE FACTORS FREQUENCY  PT (By licensed PT), OT (By licensed OT)     PT Frequency: min 5x weekly OT Frequency: min 5x weekly            Contractures Contractures Info: Not present    Additional Factors Info  Code Status, Allergies Code Status Info: DNR Allergies Info: No [place] allergies           Current Medications (08/05/2021):  This is the current hospital active medication list Current Facility-Administered Medications  Medication Dose Route Frequency Provider Last Rate Last Admin   acetaminophen (TYLENOL) tablet 1,000 mg  1,000 mg Oral Q6H PRN Cox, Amy N, DO   1,000 mg at 08/04/21 1523   Or   acetaminophen (TYLENOL) suppository 650 mg  650 mg Rectal Q6H PRN Cox, Amy N, DO       ascorbic acid (VITAMIN C) tablet 1,000 mg  1,000 mg Oral Daily Cox, Amy N, DO   1,000 mg at 08/05/21 0920   docusate sodium (COLACE) capsule 200 mg  200 mg Oral BID Wyvonnia Dusky, MD   200 mg at 08/05/21 0921   dorzolamide-timolol (COSOPT) 22.3-6.8 MG/ML ophthalmic solution 1 drop  1 drop Both Eyes BID Cox, Amy N, DO   1 drop at 08/05/21 0925   enoxaparin (LOVENOX) injection 40  mg  40 mg Subcutaneous Q24H Cox, Amy N, DO   40 mg at 08/04/21 2052   famotidine (PEPCID) tablet 20 mg  20 mg Oral BID Cox, Amy N, DO   20 mg at 08/05/21 0920   HYDROcodone-acetaminophen (NORCO/VICODIN) 5-325 MG per tablet 1 tablet  1 tablet Oral Q6H PRN Wyvonnia Dusky, MD   1 tablet at 08/05/21 7001   irbesartan (AVAPRO) tablet 150 mg  150 mg Oral Daily Cox, Amy N, DO   150 mg at 08/04/21 2055   meclizine (ANTIVERT) tablet 25 mg  25 mg Oral BID Wyvonnia Dusky, MD       morphine (PF) 2 MG/ML injection 1 mg  1  mg Intravenous Q4H PRN Wyvonnia Dusky, MD       multivitamin with minerals tablet 1 tablet  1 tablet Oral Daily Cox, Amy N, DO   1 tablet at 08/05/21 0920   ondansetron (ZOFRAN) tablet 4 mg  4 mg Oral Q6H PRN Cox, Amy N, DO       Or   ondansetron (ZOFRAN) injection 4 mg  4 mg Intravenous Q6H PRN Cox, Amy N, DO       polyethylene glycol (MIRALAX / GLYCOLAX) packet 17 g  17 g Oral Daily Wyvonnia Dusky, MD   17 g at 08/05/21 0924   tiotropium [facility]) inhalation capsule ([facility] use ONLY) 18 mcg  18 mcg Inhalation Daily Cox, Amy N, DO   18 mcg at 08/05/21 7494     Discharge Medications: Please see discharge summary for a list of discharge medications.  Relevant Imaging Results:  Relevant Lab Results:   Additional Information WHQ:[id number] (Patient's daughter employed at [employer])  Alberteen Sam, [place]

## 2021-08-05 NOTE — Progress Notes (Signed)
PROGRESS NOTE    Lauren Morris  LXB:[id number] DOB: 05/31/1932 DOA: 08/02/2021 PCP: [name], MD   Assessment & Plan:   Principal Problem:   Dizziness Active Problems:   Essential hypertension   Acquired hypothyroidism   Fatty liver disease, nonalcoholic   Chronic bronchitis (HCC)   Weakness   Vertigo   Do not resuscitate status   Musculoskeletal back pain   Meningioma (HCC)   Dizziness: likely secondary BPPV. Started on meclizine. MRI of the brain was positive for soft tissue contusion of the right parietal occipital scalp and 1.6 cm meningioma at the peripheral aspect of the right cerebellum without significant mass effect. CTA head shows no large vessel occlusion. Echo shows EF 41-63%, grade I diastolic dysfunction, & no atrial level shunt detected   HTN: continue on ARB. Continue to hold atenolol secondary to bradycardia   Hypothyroidism: not currently on thyroid medication secondary to it causing hepatic steatosis as per pt's daughter   Transaminitis: etiology unclear. Labile, but w/ minimal elevation. Continue w/ outpatient work-up    Meningioma: new dx. Will need outpatient f/u    Musculoskeletal back pain: continue w/ norco, morphine prn. PT/OT recs SNF   DVT prophylaxis: lovenox  Code Status: full  Family Communication: discussed pt's care w/ pt's daughter and answered her questions  Disposition Plan: likely d/c to SNF  Level of care: Med-Surg  Status is: Inpatient Remains inpatient appropriate because: severity of illness, still w/ dizziness and needs SNF placement       Consultants:  Neuro   Procedures:   Antimicrobials:    Subjective: Pt c/o weakness & dizziness   Objective: Vitals:   08/04/21 0821 08/04/21 1843 08/04/21 2049 08/05/21 0508  BP: (!) 151/62 (!) 129/59 (!) 150/60 135/62  Pulse: 60 70 63 66  Resp: 17 18 18 17   Temp: 98.1 F (36.7 C)  97.9 F (36.6 C) 97.8 F (36.6 C)  TempSrc:      SpO2: 97% 94% 97% 97%  Weight:       Height:        Intake/Output Summary (Last 24 hours) at 08/05/2021 0813 Last data filed at 08/04/2021 1700 Gross per 24 hour  Intake --  Output 600 ml  Net -600 ml   Filed Weights   08/02/21 1319  Weight: 72.3 kg    Examination:  General exam: Appears calm and comfortable  Respiratory system: clear breath sounds b/l  Cardiovascular system:  S1  & S2+. No rubs or gallops Gastrointestinal system: Abd is soft, NT, ND & hypoactive bowel sounds Central nervous system: alert and oriented. Moves all extremities  Psychiatry: Judgement and insight appears normal. Flat mood and affect    Data Reviewed: I have personally reviewed following labs and imaging studies  CBC: Recent Labs  Lab 08/02/21 1454 08/03/21 0354 08/04/21 0640 08/05/21 0537  WBC 9.1 9.5 7.6 8.6  NEUTROABS 5.5  --   --   --   HGB 14.9 13.5 14.2 15.0  HCT 46.1* 42.5 43.9 45.9  MCV 91.5 92.0 91.6 92.0  PLT 250 233 218 845   Basic Metabolic Panel: Recent Labs  Lab 08/02/21 1454 08/03/21 0354 08/04/21 0640 08/05/21 0537  NA 137 137 136 134*  K 4.5 4.6 3.9 4.3  CL 103 105 104 101  CO2 24 27 26 24   GLUCOSE 160* 115* 110* 107*  BUN 17 18 16 18   CREATININE 0.89 0.89 0.59 0.88  CALCIUM 8.9 9.0 8.9 9.0  MG 2.1  --   --   --  PHOS 3.5  --   --   --    GFR: Estimated Creatinine Clearance: 41.9 mL/min (by C-G formula based on SCr of 0.88 mg/dL). Liver Function Tests: Recent Labs  Lab 08/02/21 1454 08/04/21 0640 08/05/21 0537  AST 81* 46* 75*  ALT 66* 50* 67*  ALKPHOS 531* 454* 455*  BILITOT 1.3* 0.6 1.1  PROT 7.6 6.9 6.9  ALBUMIN 3.5 3.1* 3.1*   No results for input(s): LIPASE, AMYLASE in the last 168 hours. No results for input(s): AMMONIA in the last 168 hours. Coagulation Profile: No results for input(s): INR, PROTIME in the last 168 hours. Cardiac Enzymes: No results for input(s): CKTOTAL, CKMB, CKMBINDEX, TROPONINI in the last 168 hours. BNP (last 3 results) No results for input(s):  PROBNP in the last 8760 hours. HbA1C: No results for input(s): HGBA1C in the last 72 hours. CBG: No results for input(s): GLUCAP in the last 168 hours. Lipid Profile: No results for input(s): CHOL, HDL, LDLCALC, TRIG, CHOLHDL, LDLDIRECT in the last 72 hours. Thyroid Function Tests: No results for input(s): TSH, T4TOTAL, FREET4, T3FREE, THYROIDAB in the last 72 hours. Anemia Panel: Recent Labs    08/02/21 2226  VITAMINB12 294   Sepsis Labs: No results for input(s): PROCALCITON, LATICACIDVEN in the last 168 hours.  Recent Results (from the past 240 hour(s))  Resp Panel by RT-PCR (Flu A&B, Covid) Nasopharyngeal Swab     Status: None   Collection Time: 08/02/21  2:54 PM   Specimen: Nasopharyngeal Swab; Nasopharyngeal(NP) swabs in vial transport medium  Result Value Ref Range Status   SARS Coronavirus 2 by RT PCR NEGATIVE NEGATIVE Final    Comment: (NOTE) SARS-CoV-2 target nucleic acids are NOT DETECTED.  The SARS-CoV-2 RNA is generally detectable in upper respiratory specimens during the acute phase of infection. The lowest concentration of SARS-CoV-2 viral copies this assay can detect is 138 copies/mL. A negative result does not preclude SARS-Cov-2 infection and should not be used as the sole basis for treatment or other patient management decisions. A negative result may occur with  improper specimen collection/handling, submission of specimen other than nasopharyngeal swab, presence of viral mutation(s) within the areas targeted by this assay, and inadequate number of viral copies(<138 copies/mL). A negative result must be combined with clinical observations, patient history, and epidemiological information. The expected result is Negative.  Fact Sheet for Patients:  EntrepreneurPulse.com.au  Fact Sheet for Healthcare Providers:  IncredibleEmployment.be  This test is no t yet approved or cleared by the [place] FDA and  has been  authorized for detection and/or diagnosis of SARS-CoV-2 by FDA under an Emergency Use Authorization (EUA). This EUA will remain  in effect (meaning this test can be used) for the duration of the COVID-19 declaration under Section 564(b)(1) of the Act, 21 U.S.C.section 360bbb-3(b)(1), unless the authorization is terminated  or revoked sooner.       Influenza A by PCR NEGATIVE NEGATIVE Final   Influenza B by PCR NEGATIVE NEGATIVE Final    Comment: (NOTE) The Xpert Xpress SARS-CoV-2/FLU/RSV plus assay is intended as an aid in the diagnosis of influenza from Nasopharyngeal swab specimens and should not be used as a sole basis for treatment. Nasal washings and aspirates are unacceptable for Xpert Xpress SARS-CoV-2/FLU/RSV testing.  Fact Sheet for Patients: EntrepreneurPulse.com.au  Fact Sheet for Healthcare Providers: IncredibleEmployment.be  This test is not yet approved or cleared by the [place] FDA and has been authorized for detection and/or diagnosis of SARS-CoV-2 by FDA under an Emergency  Use Authorization (EUA). This EUA will remain in effect (meaning this test can be used) for the duration of the COVID-19 declaration under Section 564(b)(1) of the Act, 21 U.S.C. section 360bbb-3(b)(1), unless the authorization is terminated or revoked.  Performed at [facility], [address] [place], [place] [address]          Radiology Studies: CT ANGIO HEAD NECK W WO CM  Result Date: 08/03/2021 CLINICAL DATA:  Vertigo, central EXAM: CT ANGIOGRAPHY HEAD AND NECK TECHNIQUE: Multidetector CT imaging of the head and neck was performed using the standard protocol during bolus administration of intravenous contrast. Multiplanar CT image reconstructions and MIPs were obtained to evaluate the vascular anatomy. Carotid stenosis measurements (when applicable) are obtained utilizing NASCET criteria, using the distal internal carotid diameter  as the denominator. RADIATION DOSE REDUCTION: This exam was performed according to the departmental dose-optimization program which includes automated exposure control, adjustment of the mA and/or kV according to patient size and/or use of iterative reconstruction technique. CONTRAST:  59mL OMNIPAQUE IOHEXOL 350 MG/ML SOLN COMPARISON:  None. FINDINGS: CT HEAD FINDINGS Brain: No evidence of acute infarction, hemorrhage, hydrocephalus, or extra-axial fluid collection. Right posterior fossa meningioma, better characterized on recent MRI. Vascular: See below. Skull: Posterior scalp contusion without acute fracture. Sinuses: Left frontoethmoidal mucosal thickening/secretions. Otherwise, clear visualized sinuses. Orbits: No acute finding. Review of the MIP images confirms the above findings CTA NECK FINDINGS Aortic arch: Great vessel origins are patent. Right carotid system: Patent. Atherosclerosis at the carotid bifurcation without greater than 50% narrowing relative to the distal vessel. Left carotid system: Patent. Atherosclerosis at the carotid bifurcation without greater than 50% stenosis relative to the distal vessel. Vertebral arteries: Right dominant. Patent bilaterally without significant (greater than 50%) stenosis. Skeleton: Multilevel degenerative disc disease and facet arthropathy with varying degrees of neural foraminal stenosis. Other neck: No evidence of acute abnormality on limited assessment. Upper chest: Expiratory changes in the visualized lung apices. No consolidation. Review of the MIP images confirms the above findings CTA HEAD FINDINGS Anterior circulation: Atherosclerosis of bilateral intracranial ICAs with mild resulting narrowing. Bilateral MCAs and ACAs are patent without proximal hemodynamically significant stenosis. No aneurysm identified. Posterior circulation: Bilateral vertebral arteries, basilar artery, and posterior cerebral arteries are patent without proximal hemodynamically  significant stenosis. Bilateral PICA and superior cerebellar arteries are patent. No aneurysm identified. Venous sinuses: As permitted by contrast timing, patent. Review of the MIP images confirms the above findings IMPRESSION: CT head: 1. No evidence of acute intracranial abnormality. 2. Right posterior fossa meningioma, better characterized on recent MRI. 3. Right posterior scalp contusion without acute fracture. CTA: 1. No large vessel occlusion. 2. Atherosclerosis without hemodynamically significant proximal stenosis in the head or neck. Electronically Signed   By: Margaretha Sheffield M.D.   On: 08/03/2021 15:47   ECHOCARDIOGRAM COMPLETE  Result Date: 08/04/2021    ECHOCARDIOGRAM REPORT   Patient Name:   JHANIYA BRISKI Date of Exam: 08/04/2021 Medical Rec #:  [id number]   Height:       62.0 in Accession #:    [id number]  Weight:       159.4 lb Date of Birth:  [birth year]  BSA:          1.736 m Patient Age:    27 years    BP:           137/65 mmHg Patient Gender: F           HR:  60 bpm. Exam Location:  [facility] Procedure: 2D Echo, Color Doppler, Cardiac Doppler and Intracardiac            Opacification Agent Indications:     R06.00 Dyspnea  History:         Patient has prior history of Echocardiogram examinations. Risk                  Factors:Hypertension and Sleep Apnea.  Sonographer:     Charmayne Sheer Referring Phys:  [id number] ASHISH ARORA Diagnosing Phys: Ida Rogue MD  Sonographer Comments: Suboptimal apical window. IMPRESSIONS  1. Left ventricular ejection fraction, by estimation, is 60 to 65%. The left ventricle has normal function. The left ventricle has no regional wall motion abnormalities. Left ventricular diastolic parameters are consistent with Grade I diastolic dysfunction (impaired relaxation).  2. Right ventricular systolic function is normal. The right ventricular size is normal. Tricuspid regurgitation signal is inadequate for assessing PA pressure.  3. The mitral valve is normal in  structure. No evidence of mitral valve regurgitation. No evidence of mitral stenosis.  4. The aortic valve is normal in structure. Aortic valve regurgitation is not visualized. Aortic valve sclerosis/calcification is present, without any evidence of aortic stenosis.  5. The inferior vena cava is normal in size with greater than 50% respiratory variability, suggesting right atrial pressure of 3 mmHg. FINDINGS  Left Ventricle: Left ventricular ejection fraction, by estimation, is 60 to 65%. The left ventricle has normal function. The left ventricle has no regional wall motion abnormalities. Definity contrast agent was given IV to delineate the left ventricular  endocardial borders. The left ventricular internal cavity size was normal in size. There is no left ventricular hypertrophy. Left ventricular diastolic parameters are consistent with Grade I diastolic dysfunction (impaired relaxation). Right Ventricle: The right ventricular size is normal. No increase in right ventricular wall thickness. Right ventricular systolic function is normal. Tricuspid regurgitation signal is inadequate for assessing PA pressure. Left Atrium: Left atrial size was normal in size. Right Atrium: Right atrial size was normal in size. Pericardium: There is no evidence of pericardial effusion. Mitral Valve: The mitral valve is normal in structure. Mild mitral annular calcification. No evidence of mitral valve regurgitation. No evidence of mitral valve stenosis. MV peak gradient, 4.1 mmHg. The mean mitral valve gradient is 2.0 mmHg. Tricuspid Valve: The tricuspid valve is normal in structure. Tricuspid valve regurgitation is not demonstrated. No evidence of tricuspid stenosis. Aortic Valve: The aortic valve is normal in structure. Aortic valve regurgitation is not visualized. Aortic valve sclerosis/calcification is present, without any evidence of aortic stenosis. Aortic valve mean gradient measures 5.0 mmHg. Aortic valve peak  gradient  measures 10.4 mmHg. Aortic valve area, by VTI measures 2.30 cm. Pulmonic Valve: The pulmonic valve was normal in structure. Pulmonic valve regurgitation is not visualized. No evidence of pulmonic stenosis. Aorta: The aortic root is normal in size and structure. Venous: The inferior vena cava is normal in size with greater than 50% respiratory variability, suggesting right atrial pressure of 3 mmHg. IAS/Shunts: No atrial level shunt detected by color flow Doppler.  LEFT VENTRICLE PLAX 2D LVIDd:         4.26 cm   Diastology LVIDs:         2.41 cm   LV e' medial:    5.44 cm/s LV PW:         0.96 cm   LV E/e' medial:  17.3 LV IVS:        0.92  cm   LV e' lateral:   5.00 cm/s LVOT diam:     1.90 cm   LV E/e' lateral: 18.8 LV SV:         79 LV SV Index:   45 LVOT Area:     2.84 cm  RIGHT VENTRICLE RV Basal diam:  3.67 cm LEFT ATRIUM             Index        RIGHT ATRIUM           Index LA diam:        2.80 cm 1.61 cm/m   RA Area:     13.90 cm LA Vol (A2C):   46.7 ml 26.90 ml/m  RA Volume:   28.00 ml  16.13 ml/m LA Vol (A4C):   40.6 ml 23.39 ml/m LA Biplane Vol: 43.4 ml 25.00 ml/m  AORTIC VALVE                     PULMONIC VALVE AV Area (Vmax):    2.01 cm      PV Vmax:       0.70 m/s AV Area (Vmean):   2.04 cm      PV Vmean:      52.000 cm/s AV Area (VTI):     2.30 cm      PV VTI:        0.139 m AV Vmax:           161.00 cm/s   PV Peak grad:  1.9 mmHg AV Vmean:          106.000 cm/s  PV Mean grad:  1.0 mmHg AV VTI:            0.343 m AV Peak Grad:      10.4 mmHg AV Mean Grad:      5.0 mmHg LVOT Vmax:         114.00 cm/s LVOT Vmean:        76.400 cm/s LVOT VTI:          0.278 m LVOT/AV VTI ratio: 0.81  AORTA Ao Root diam: 2.60 cm MITRAL VALVE MV Area (PHT): 2.80 cm    SHUNTS MV Area VTI:   2.31 cm    Systemic VTI:  0.28 m MV Peak grad:  4.1 mmHg    Systemic Diam: 1.90 cm MV Mean grad:  2.0 mmHg MV Vmax:       1.01 m/s MV Vmean:      61.4 cm/s MV Decel Time: 271 msec MV E velocity: 94.10 cm/s MV A velocity: 92.40  cm/s MV E/A ratio:  1.02 Ida Rogue MD Electronically signed by Ida Rogue MD Signature Date/Time: 08/04/2021/12:38:38 PM    Final         Scheduled Meds:  vitamin C  1,000 mg Oral Daily   docusate sodium  200 mg Oral BID   dorzolamide-timolol  1 drop Both Eyes BID   enoxaparin (LOVENOX) injection  40 mg Subcutaneous Q24H   famotidine  20 mg Oral BID   irbesartan  150 mg Oral Daily   multivitamin with minerals  1 tablet Oral Daily   polyethylene glycol  17 g Oral Daily   tiotropium  18 mcg Inhalation Daily   Continuous Infusions:   LOS: 2 days    Time spent: 30 mins     Wyvonnia Dusky, MD [facility] Pager [phone]  If 7PM-7AM, please contact night-coverage 08/05/2021, 8:13 AM

## 2021-08-05 NOTE — TOC Progression Note (Signed)
Transition of Care [facility]) - Progression Note    Patient Details  Name: Lauren Morris MRN: [id number] Date of Birth: July 02, 1931  [facility] of Care [facility]) CM/SW Contact  Kely Dohn, [place], [place] Phone Number: 08/05/2021, 11:01 AM  Clinical Narrative:    Patient and patient's daughter now agreeable to SNF. Patient's daughter's  preference in [facility]certain. [facility] and [facility] to be considered if [employer] not available. [facility] and [facility] to be completed and faxed out.   Transition of Care to continue to follow  [facility], LCSW Transition of Care [phone]    Expected Discharge Plan: [place] Barriers to Discharge: Continued Medical Work up  Expected Discharge Plan and Services Expected Discharge Plan: [place]   Discharge Planning Services: CM Consult   Living arrangements for the past 2 months: Single Family Home                 DME Arranged: 3-N-1 DME Agency: [employer] Date DME Agency Contacted: 08/03/21 Time DME Agency Contacted: [phone] Representative spoke with at DME Agency: [place]: PT, RN [place] Agency: [place] ([place]) Date [place]: 08/03/21 Time [place]: [phone] Representative spoke with at [place]: [place] ([place]) Interventions    Readmission Risk Interventions No flowsheet data found.

## 2021-08-06 LAB — COMPREHENSIVE METABOLIC PANEL
ALT: 77 U/L — ABNORMAL HIGH (ref 0–44)
AST: 88 U/L — ABNORMAL HIGH (ref 15–41)
Albumin: 3.1 g/dL — ABNORMAL LOW (ref 3.5–5.0)
Alkaline Phosphatase: 459 U/L — ABNORMAL HIGH (ref 38–126)
Anion gap: 7 (ref 5–15)
BUN: 19 mg/dL (ref 8–23)
CO2: 25 mmol/L (ref 22–32)
Calcium: 8.9 mg/dL (ref 8.9–10.3)
Chloride: 103 mmol/L (ref 98–111)
Creatinine, Ser: 0.79 mg/dL (ref 0.44–1.00)
GFR, Estimated: >60 mL/min (ref >60)
Glucose, Bld: 125 mg/dL — ABNORMAL HIGH (ref 70–99)
Potassium: 4.3 mmol/L (ref 3.5–5.1)
Sodium: 135 mmol/L (ref 135–145)
Total Bilirubin: 1.2 mg/dL (ref 0.3–1.2)
Total Protein: 6.9 g/dL (ref 6.5–8.1)

## 2021-08-06 LAB — CBC
HCT: 45.9 % (ref 36.0–46.0)
Hemoglobin: 14.8 g/dL (ref 12.0–15.0)
MCH: 29.6 pg (ref 26.0–34.0)
MCHC: 32.2 g/dL (ref 30.0–36.0)
MCV: 91.8 fL (ref 80.0–100.0)
Platelets: 222 10*3/uL (ref 150–400)
RBC: 5 MIL/uL (ref 3.87–5.11)
RDW: 13.5 % (ref 11.5–15.5)
WBC: 8.7 10*3/uL (ref 4.0–10.5)
nRBC: 0 % (ref 0.0–0.2)

## 2021-08-06 NOTE — Progress Notes (Signed)
PROGRESS NOTE    Lauren Morris  UVO:[id number] DOB: [birth year] DOA: 08/02/2021 PCP: [name], MD   Assessment & Plan:   Principal Problem:   Dizziness Active Problems:   Essential hypertension   Acquired hypothyroidism   Fatty liver disease, nonalcoholic   Chronic bronchitis (HCC)   Weakness   Vertigo   Do not resuscitate status   Musculoskeletal back pain   Meningioma (HCC)   Dizziness: likely secondary BPPV. Continue on meclizine. MRI of the brain was positive for soft tissue contusion of the right parietal occipital scalp and 1.6 cm meningioma at the peripheral aspect of the right cerebellum without significant mass effect. CTA head shows no large vessel occlusion. Echo shows EF 74-25%, grade I diastolic dysfunction, & no atrial level shunt detected   HTN: continue on irbesartan. Continue to hold BB secondary to bradycardia   Hypothyroidism: not currently on thyroid medication secondary to it causing hepatic steatosis as per pt's daughter   Transaminitis: etiology unclear. Labile, minimal elevation. Continue w/ outpatient work-up    Meningioma: new dx. Will need outpatient f/u    Musculoskeletal back pain: continue on morphine, norco prn. PT/OT recs SNF   DVT prophylaxis: lovenox  Code Status: full  Family Communication: discussed pt's care w/ pt's daughter and answered her questions  Disposition Plan: likely d/c to SNF  Level of care: Med-Surg  Status is: Inpatient Remains inpatient appropriate because: severity of illness, waiting on SNF placement       Consultants:  Neuro   Procedures:   Antimicrobials:    Subjective: Pt c/o generalized weakness  Objective: Vitals:   08/05/21 1142 08/05/21 1616 08/05/21 2022 08/06/21 0531  BP: 127/76 (!) 147/62 (!) 116/55 (!) 123/57  Pulse: 72 70 72 69  Resp: 16 16 20 19   Temp: 97.9 F (36.6 C) 97.6 F (36.4 C) 98.4 F (36.9 C) 98.2 F (36.8 C)  TempSrc:      SpO2: 97% 97% 95% 97%  Weight:       Height:        Intake/Output Summary (Last 24 hours) at 08/06/2021 0752 Last data filed at 08/05/2021 1858 Gross per 24 hour  Intake 240 ml  Output 400 ml  Net -160 ml   Filed Weights   08/02/21 1319  Weight: 72.3 kg    Examination:  General exam: Appears comfortable  Respiratory system: clear breath sounds b/l. No wheezes, rales  Cardiovascular system:  S1/S2+. No clicks or gallops  Gastrointestinal system: Abd is soft, NT, ND & hypoactive bowel sounds  Central nervous system: alert and oriented. Moves all extremities   Psychiatry: Judgement and insight appears normal. Flat mood and affect     Data Reviewed: I have personally reviewed following labs and imaging studies  CBC: Recent Labs  Lab 08/02/21 1454 08/03/21 0354 08/04/21 0640 08/05/21 0537 08/06/21 0431  WBC 9.1 9.5 7.6 8.6 8.7  NEUTROABS 5.5  --   --   --   --   HGB 14.9 13.5 14.2 15.0 14.8  HCT 46.1* 42.5 43.9 45.9 45.9  MCV 91.5 92.0 91.6 92.0 91.8  PLT 250 233 218 225 956   Basic Metabolic Panel: Recent Labs  Lab 08/02/21 1454 08/03/21 0354 08/04/21 0640 08/05/21 0537 08/06/21 0431  NA 137 137 136 134* 135  K 4.5 4.6 3.9 4.3 4.3  CL 103 105 104 101 103  CO2 24 27 26 24 25   GLUCOSE 160* 115* 110* 107* 125*  BUN 17 18 16 18  19  CREATININE 0.89 0.89 0.59 0.88 0.79  CALCIUM 8.9 9.0 8.9 9.0 8.9  MG 2.1  --   --   --   --   PHOS 3.5  --   --   --   --    GFR: Estimated Creatinine Clearance: 46.1 mL/min (by C-G formula based on SCr of 0.79 mg/dL). Liver Function Tests: Recent Labs  Lab 08/02/21 1454 08/04/21 0640 08/05/21 0537 08/06/21 0431  AST 81* 46* 75* 88*  ALT 66* 50* 67* 77*  ALKPHOS 531* 454* 455* 459*  BILITOT 1.3* 0.6 1.1 1.2  PROT 7.6 6.9 6.9 6.9  ALBUMIN 3.5 3.1* 3.1* 3.1*   No results for input(s): LIPASE, AMYLASE in the last 168 hours. No results for input(s): AMMONIA in the last 168 hours. Coagulation Profile: No results for input(s): INR, PROTIME in the last 168  hours. Cardiac Enzymes: No results for input(s): CKTOTAL, CKMB, CKMBINDEX, TROPONINI in the last 168 hours. BNP (last 3 results) No results for input(s): PROBNP in the last 8760 hours. HbA1C: No results for input(s): HGBA1C in the last 72 hours. CBG: No results for input(s): GLUCAP in the last 168 hours. Lipid Profile: No results for input(s): CHOL, HDL, LDLCALC, TRIG, CHOLHDL, LDLDIRECT in the last 72 hours. Thyroid Function Tests: No results for input(s): TSH, T4TOTAL, FREET4, T3FREE, THYROIDAB in the last 72 hours. Anemia Panel: No results for input(s): VITAMINB12, FOLATE, FERRITIN, TIBC, IRON, RETICCTPCT in the last 72 hours.  Sepsis Labs: No results for input(s): PROCALCITON, LATICACIDVEN in the last 168 hours.  Recent Results (from the past 240 hour(s))  Resp Panel by RT-PCR (Flu A&B, Covid) Nasopharyngeal Swab     Status: None   Collection Time: 08/02/21  2:54 PM   Specimen: Nasopharyngeal Swab; Nasopharyngeal(NP) swabs in vial transport medium  Result Value Ref Range Status   SARS Coronavirus 2 by RT PCR NEGATIVE NEGATIVE Final    Comment: (NOTE) SARS-CoV-2 target nucleic acids are NOT DETECTED.  The SARS-CoV-2 RNA is generally detectable in upper respiratory specimens during the acute phase of infection. The lowest concentration of SARS-CoV-2 viral copies this assay can detect is 138 copies/mL. A negative result does not preclude SARS-Cov-2 infection and should not be used as the sole basis for treatment or other patient management decisions. A negative result may occur with  improper specimen collection/handling, submission of specimen other than nasopharyngeal swab, presence of viral mutation(s) within the areas targeted by this assay, and inadequate number of viral copies(<138 copies/mL). A negative result must be combined with clinical observations, patient history, and epidemiological information. The expected result is Negative.  Fact Sheet for Patients:   EntrepreneurPulse.com.au  Fact Sheet for Healthcare Providers:  IncredibleEmployment.be  This test is no t yet approved or cleared by the [place] FDA and  has been authorized for detection and/or diagnosis of SARS-CoV-2 by FDA under an Emergency Use Authorization (EUA). This EUA will remain  in effect (meaning this test can be used) for the duration of the COVID-19 declaration under Section 564(b)(1) of the Act, 21 U.S.C.section 360bbb-3(b)(1), unless the authorization is terminated  or revoked sooner.       Influenza A by PCR NEGATIVE NEGATIVE Final   Influenza B by PCR NEGATIVE NEGATIVE Final    Comment: (NOTE) The Xpert Xpress SARS-CoV-2/FLU/RSV plus assay is intended as an aid in the diagnosis of influenza from Nasopharyngeal swab specimens and should not be used as a sole basis for treatment. Nasal washings and aspirates are unacceptable for Xpert Xpress  SARS-CoV-2/FLU/RSV testing.  Fact Sheet for Patients: EntrepreneurPulse.com.au  Fact Sheet for Healthcare Providers: IncredibleEmployment.be  This test is not yet approved or cleared by the [place] FDA and has been authorized for detection and/or diagnosis of SARS-CoV-2 by FDA under an Emergency Use Authorization (EUA). This EUA will remain in effect (meaning this test can be used) for the duration of the COVID-19 declaration under Section 564(b)(1) of the Act, 21 U.S.C. section 360bbb-3(b)(1), unless the authorization is terminated or revoked.  Performed at [facility], [address] [place], [place] [address]          Radiology Studies: ECHOCARDIOGRAM COMPLETE  Result Date: 08/04/2021    ECHOCARDIOGRAM REPORT   Patient Name:   LEQUISHA CAMMACK Date of Exam: 08/04/2021 Medical Rec #:  [id number]   Height:       62.0 in Accession #:    [id number]  Weight:       159.4 lb Date of Birth:  23-Apr-1932  BSA:          1.736 m  Patient Age:    88 years    BP:           137/65 mmHg Patient Gender: F           HR:           60 bpm. Exam Location:  [facility] Procedure: 2D Echo, Color Doppler, Cardiac Doppler and Intracardiac            Opacification Agent Indications:     R06.00 Dyspnea  History:         Patient has prior history of Echocardiogram examinations. Risk                  Factors:Hypertension and Sleep Apnea.  Sonographer:     Charmayne Sheer Referring Phys:  [id number] ASHISH ARORA Diagnosing Phys: Ida Rogue MD  Sonographer Comments: Suboptimal apical window. IMPRESSIONS  1. Left ventricular ejection fraction, by estimation, is 60 to 65%. The left ventricle has normal function. The left ventricle has no regional wall motion abnormalities. Left ventricular diastolic parameters are consistent with Grade I diastolic dysfunction (impaired relaxation).  2. Right ventricular systolic function is normal. The right ventricular size is normal. Tricuspid regurgitation signal is inadequate for assessing PA pressure.  3. The mitral valve is normal in structure. No evidence of mitral valve regurgitation. No evidence of mitral stenosis.  4. The aortic valve is normal in structure. Aortic valve regurgitation is not visualized. Aortic valve sclerosis/calcification is present, without any evidence of aortic stenosis.  5. The inferior vena cava is normal in size with greater than 50% respiratory variability, suggesting right atrial pressure of 3 mmHg. FINDINGS  Left Ventricle: Left ventricular ejection fraction, by estimation, is 60 to 65%. The left ventricle has normal function. The left ventricle has no regional wall motion abnormalities. Definity contrast agent was given IV to delineate the left ventricular  endocardial borders. The left ventricular internal cavity size was normal in size. There is no left ventricular hypertrophy. Left ventricular diastolic parameters are consistent with Grade I diastolic dysfunction (impaired relaxation). Right  Ventricle: The right ventricular size is normal. No increase in right ventricular wall thickness. Right ventricular systolic function is normal. Tricuspid regurgitation signal is inadequate for assessing PA pressure. Left Atrium: Left atrial size was normal in size. Right Atrium: Right atrial size was normal in size. Pericardium: There is no evidence of pericardial effusion. Mitral Valve: The mitral valve is normal in structure. Mild  mitral annular calcification. No evidence of mitral valve regurgitation. No evidence of mitral valve stenosis. MV peak gradient, 4.1 mmHg. The mean mitral valve gradient is 2.0 mmHg. Tricuspid Valve: The tricuspid valve is normal in structure. Tricuspid valve regurgitation is not demonstrated. No evidence of tricuspid stenosis. Aortic Valve: The aortic valve is normal in structure. Aortic valve regurgitation is not visualized. Aortic valve sclerosis/calcification is present, without any evidence of aortic stenosis. Aortic valve mean gradient measures 5.0 mmHg. Aortic valve peak  gradient measures 10.4 mmHg. Aortic valve area, by VTI measures 2.30 cm. Pulmonic Valve: The pulmonic valve was normal in structure. Pulmonic valve regurgitation is not visualized. No evidence of pulmonic stenosis. Aorta: The aortic root is normal in size and structure. Venous: The inferior vena cava is normal in size with greater than 50% respiratory variability, suggesting right atrial pressure of 3 mmHg. IAS/Shunts: No atrial level shunt detected by color flow Doppler.  LEFT VENTRICLE PLAX 2D LVIDd:         4.26 cm   Diastology LVIDs:         2.41 cm   LV e' medial:    5.44 cm/s LV PW:         0.96 cm   LV E/e' medial:  17.3 LV IVS:        0.92 cm   LV e' lateral:   5.00 cm/s LVOT diam:     1.90 cm   LV E/e' lateral: 18.8 LV SV:         79 LV SV Index:   45 LVOT Area:     2.84 cm  RIGHT VENTRICLE RV Basal diam:  3.67 cm LEFT ATRIUM             Index        RIGHT ATRIUM           Index LA diam:        2.80  cm 1.61 cm/m   RA Area:     13.90 cm LA Vol (A2C):   46.7 ml 26.90 ml/m  RA Volume:   28.00 ml  16.13 ml/m LA Vol (A4C):   40.6 ml 23.39 ml/m LA Biplane Vol: 43.4 ml 25.00 ml/m  AORTIC VALVE                     PULMONIC VALVE AV Area (Vmax):    2.01 cm      PV Vmax:       0.70 m/s AV Area (Vmean):   2.04 cm      PV Vmean:      52.000 cm/s AV Area (VTI):     2.30 cm      PV VTI:        0.139 m AV Vmax:           161.00 cm/s   PV Peak grad:  1.9 mmHg AV Vmean:          106.000 cm/s  PV Mean grad:  1.0 mmHg AV VTI:            0.343 m AV Peak Grad:      10.4 mmHg AV Mean Grad:      5.0 mmHg LVOT Vmax:         114.00 cm/s LVOT Vmean:        76.400 cm/s LVOT VTI:          0.278 m LVOT/AV VTI ratio: 0.81  AORTA Ao Root diam: 2.60 cm MITRAL VALVE  MV Area (PHT): 2.80 cm    SHUNTS MV Area VTI:   2.31 cm    Systemic VTI:  0.28 m MV Peak grad:  4.1 mmHg    Systemic Diam: 1.90 cm MV Mean grad:  2.0 mmHg MV Vmax:       1.01 m/s MV Vmean:      61.4 cm/s MV Decel Time: 271 msec MV E velocity: 94.10 cm/s MV A velocity: 92.40 cm/s MV E/A ratio:  1.02 Ida Rogue MD Electronically signed by Ida Rogue MD Signature Date/Time: 08/04/2021/12:38:38 PM    Final         Scheduled Meds:  vitamin C  1,000 mg Oral Daily   docusate sodium  200 mg Oral BID   dorzolamide-timolol  1 drop Both Eyes BID   enoxaparin (LOVENOX) injection  40 mg Subcutaneous Q24H   famotidine  20 mg Oral BID   irbesartan  150 mg Oral Daily   meclizine  25 mg Oral BID   multivitamin with minerals  1 tablet Oral Daily   polyethylene glycol  17 g Oral Daily   tiotropium  18 mcg Inhalation Daily   Continuous Infusions:   LOS: 3 days    Time spent: 20 mins     Wyvonnia Dusky, MD [facility] Pager [phone]  If 7PM-7AM, please contact night-coverage 08/06/2021, 7:52 AM

## 2021-08-07 LAB — COMPREHENSIVE METABOLIC PANEL
ALT: 77 U/L — ABNORMAL HIGH (ref 0–44)
AST: 83 U/L — ABNORMAL HIGH (ref 15–41)
Albumin: 2.9 g/dL — ABNORMAL LOW (ref 3.5–5.0)
Alkaline Phosphatase: 446 U/L — ABNORMAL HIGH (ref 38–126)
Anion gap: 10 (ref 5–15)
BUN: 24 mg/dL — ABNORMAL HIGH (ref 8–23)
CO2: 24 mmol/L (ref 22–32)
Calcium: 8.9 mg/dL (ref 8.9–10.3)
Chloride: 101 mmol/L (ref 98–111)
Creatinine, Ser: 0.75 mg/dL (ref 0.44–1.00)
GFR, Estimated: >60 mL/min (ref >60)
Glucose, Bld: 123 mg/dL — ABNORMAL HIGH (ref 70–99)
Potassium: 4.4 mmol/L (ref 3.5–5.1)
Sodium: 135 mmol/L (ref 135–145)
Total Bilirubin: 1 mg/dL (ref 0.3–1.2)
Total Protein: 6.8 g/dL (ref 6.5–8.1)

## 2021-08-07 LAB — CBC
HCT: 45.9 % (ref 36.0–46.0)
Hemoglobin: 14.6 g/dL (ref 12.0–15.0)
MCH: 29.5 pg (ref 26.0–34.0)
MCHC: 31.8 g/dL (ref 30.0–36.0)
MCV: 92.7 fL (ref 80.0–100.0)
Platelets: 220 10*3/uL (ref 150–400)
RBC: 4.95 MIL/uL (ref 3.87–5.11)
RDW: 13.5 % (ref 11.5–15.5)
WBC: 8.3 10*3/uL (ref 4.0–10.5)
nRBC: 0 % (ref 0.0–0.2)

## 2021-08-07 NOTE — Progress Notes (Signed)
PROGRESS NOTE    Lauren Morris  EUM:[id number] DOB: Jun 24, 1932 DOA: 08/02/2021 PCP: [name], MD   Assessment & Plan:   Principal Problem:   Dizziness Active Problems:   Essential hypertension   Acquired hypothyroidism   Fatty liver disease, nonalcoholic   Chronic bronchitis (HCC)   Weakness   Vertigo   Do not resuscitate status   Musculoskeletal back pain   Meningioma (HCC)   Dizziness: likely secondary BPPV. Continue on meclizine. MRI of the brain was positive for soft tissue contusion of the right parietal occipital scalp and 1.6 cm meningioma at the peripheral aspect of the right cerebellum without significant mass effect. CTA head shows no large vessel occlusion. Echo shows EF 54-00%, grade I diastolic dysfunction, & no atrial level shunt detected   HTN: continue on ARB. Continue to hold BB secondary to bradycardia   Hypothyroidism: not currently on thyroid medication secondary to it causing hepatic steatosis as per pt's daughter   Transaminitis: etiology unclear. Labile w/ minimal elevation. Continue w/ outpatient work-up   Meningioma: new dx. Will need outpatient f/u    Musculoskeletal back pain: continue on norco, morphine prn. PT/OT recs SNF    DVT prophylaxis: lovenox  Code Status: full  Family Communication: discussed pt's care w/ pt's daughter and answered her questions  Disposition Plan: likely d/c to SNF  Level of care: Med-Surg  Status is: Inpatient Remains inpatient appropriate because: severity of illness, waiting on SNF placement       Consultants:  Neuro   Procedures:   Antimicrobials:    Subjective: Pt c/o dizziness, improved w/ meclizine   Objective: Vitals:   08/06/21 1547 08/06/21 2004 08/07/21 0339 08/07/21 0805  BP: (!) 111/54 (!) 125/59 (!) 142/55 (!) 129/53  Pulse: 74 78 77 68  Resp: 16 15 16 16   Temp: 98.1 F (36.7 C) 98 F (36.7 C) 98.7 F (37.1 C) 97.7 F (36.5 C)  TempSrc:  Oral Oral   SpO2: 95% 95% 95% 98%   Weight:      Height:        Intake/Output Summary (Last 24 hours) at 08/07/2021 0810 Last data filed at 08/07/2021 0343 Gross per 24 hour  Intake 720 ml  Output 350 ml  Net 370 ml   Filed Weights   08/02/21 1319  Weight: 72.3 kg    Examination:  General exam: Appears calm & comfortable  Respiratory system: clear breath sounds b/l. No wheezes, rales Cardiovascular system:  S1 & S2+. No rubs or gallops Gastrointestinal system: Abd is soft, NT, ND & hypoactive bowel sounds  Central nervous system: alert and oriented. Moves all extremities   Psychiatry: Judgement and insight appears normal. Flat mood and affect     Data Reviewed: I have personally reviewed following labs and imaging studies  CBC: Recent Labs  Lab 08/02/21 1454 08/03/21 0354 08/04/21 0640 08/05/21 0537 08/06/21 0431 08/07/21 0618  WBC 9.1 9.5 7.6 8.6 8.7 8.3  NEUTROABS 5.5  --   --   --   --   --   HGB 14.9 13.5 14.2 15.0 14.8 14.6  HCT 46.1* 42.5 43.9 45.9 45.9 45.9  MCV 91.5 92.0 91.6 92.0 91.8 92.7  PLT 250 233 218 225 222 867   Basic Metabolic Panel: Recent Labs  Lab 08/02/21 1454 08/03/21 0354 08/04/21 0640 08/05/21 0537 08/06/21 0431 08/07/21 0618  NA 137 137 136 134* 135 135  K 4.5 4.6 3.9 4.3 4.3 4.4  CL 103 105 104 101 103  101  CO2 24 27 26 24 25 24   GLUCOSE 160* 115* 110* 107* 125* 123*  BUN 17 18 16 18 19  24*  CREATININE 0.89 0.89 0.59 0.88 0.79 0.75  CALCIUM 8.9 9.0 8.9 9.0 8.9 8.9  MG 2.1  --   --   --   --   --   PHOS 3.5  --   --   --   --   --    GFR: Estimated Creatinine Clearance: 46.1 mL/min (by C-G formula based on SCr of 0.75 mg/dL). Liver Function Tests: Recent Labs  Lab 08/02/21 1454 08/04/21 0640 08/05/21 0537 08/06/21 0431 08/07/21 0618  AST 81* 46* 75* 88* 83*  ALT 66* 50* 67* 77* 77*  ALKPHOS 531* 454* 455* 459* 446*  BILITOT 1.3* 0.6 1.1 1.2 1.0  PROT 7.6 6.9 6.9 6.9 6.8  ALBUMIN 3.5 3.1* 3.1* 3.1* 2.9*   No results for input(s): LIPASE, AMYLASE  in the last 168 hours. No results for input(s): AMMONIA in the last 168 hours. Coagulation Profile: No results for input(s): INR, PROTIME in the last 168 hours. Cardiac Enzymes: No results for input(s): CKTOTAL, CKMB, CKMBINDEX, TROPONINI in the last 168 hours. BNP (last 3 results) No results for input(s): PROBNP in the last 8760 hours. HbA1C: No results for input(s): HGBA1C in the last 72 hours. CBG: No results for input(s): GLUCAP in the last 168 hours. Lipid Profile: No results for input(s): CHOL, HDL, LDLCALC, TRIG, CHOLHDL, LDLDIRECT in the last 72 hours. Thyroid Function Tests: No results for input(s): TSH, T4TOTAL, FREET4, T3FREE, THYROIDAB in the last 72 hours. Anemia Panel: No results for input(s): VITAMINB12, FOLATE, FERRITIN, TIBC, IRON, RETICCTPCT in the last 72 hours.  Sepsis Labs: No results for input(s): PROCALCITON, LATICACIDVEN in the last 168 hours.  Recent Results (from the past 240 hour(s))  Resp Panel by RT-PCR (Flu A&B, Covid) Nasopharyngeal Swab     Status: None   Collection Time: 08/02/21  2:54 PM   Specimen: Nasopharyngeal Swab; Nasopharyngeal(NP) swabs in vial transport medium  Result Value Ref Range Status   SARS Coronavirus 2 by RT PCR NEGATIVE NEGATIVE Final    Comment: (NOTE) SARS-CoV-2 target nucleic acids are NOT DETECTED.  The SARS-CoV-2 RNA is generally detectable in upper respiratory specimens during the acute phase of infection. The lowest concentration of SARS-CoV-2 viral copies this assay can detect is 138 copies/mL. A negative result does not preclude SARS-Cov-2 infection and should not be used as the sole basis for treatment or other patient management decisions. A negative result may occur with  improper specimen collection/handling, submission of specimen other than nasopharyngeal swab, presence of viral mutation(s) within the areas targeted by this assay, and inadequate number of viral copies(<138 copies/mL). A negative result must be  combined with clinical observations, patient history, and epidemiological information. The expected result is Negative.  Fact Sheet for Patients:  EntrepreneurPulse.com.au  Fact Sheet for Healthcare Providers:  IncredibleEmployment.be  This test is no t yet approved or cleared by the [place] FDA and  has been authorized for detection and/or diagnosis of SARS-CoV-2 by FDA under an Emergency Use Authorization (EUA). This EUA will remain  in effect (meaning this test can be used) for the duration of the COVID-19 declaration under Section 564(b)(1) of the Act, 21 U.S.C.section 360bbb-3(b)(1), unless the authorization is terminated  or revoked sooner.       Influenza A by PCR NEGATIVE NEGATIVE Final   Influenza B by PCR NEGATIVE NEGATIVE Final  Comment: (NOTE) The Xpert Xpress SARS-CoV-2/FLU/RSV plus assay is intended as an aid in the diagnosis of influenza from Nasopharyngeal swab specimens and should not be used as a sole basis for treatment. Nasal washings and aspirates are unacceptable for Xpert Xpress SARS-CoV-2/FLU/RSV testing.  Fact Sheet for Patients: EntrepreneurPulse.com.au  Fact Sheet for Healthcare Providers: IncredibleEmployment.be  This test is not yet approved or cleared by the [place] FDA and has been authorized for detection and/or diagnosis of SARS-CoV-2 by FDA under an Emergency Use Authorization (EUA). This EUA will remain in effect (meaning this test can be used) for the duration of the COVID-19 declaration under Section 564(b)(1) of the Act, 21 U.S.C. section 360bbb-3(b)(1), unless the authorization is terminated or revoked.  Performed at [facility], [address], [place], [place] [address]          Radiology Studies: No results found.      Scheduled Meds:  vitamin C  1,000 mg Oral Daily   docusate sodium  200 mg Oral BID    dorzolamide-timolol  1 drop Both Eyes BID   enoxaparin (LOVENOX) injection  40 mg Subcutaneous Q24H   famotidine  20 mg Oral BID   irbesartan  150 mg Oral Daily   meclizine  25 mg Oral BID   multivitamin with minerals  1 tablet Oral Daily   polyethylene glycol  17 g Oral Daily   tiotropium  18 mcg Inhalation Daily   Continuous Infusions:   LOS: 4 days    Time spent: 15 mins     Wyvonnia Dusky, MD [facility] Pager [phone]  If 7PM-7AM, please contact night-coverage 08/07/2021, 8:10 AM

## 2021-08-07 NOTE — TOC Progression Note (Signed)
Transition of Care [facility]) - Progression Note    Patient Details  Name: LAURYNN MCCORVEY MRN: [id number] Date of Birth: 01-22-1932  [facility] CM/SW Contact  Kerin Salen, RN Phone Number: 08/07/2021, 2:23 PM  Clinical Narrative: [facility] called and spoke with patients daughter [name] about [facility] acceptance for rehab. Daughter approves. Possible discharge in the morning.      Expected Discharge Plan: [place] Barriers to Discharge: Continued Medical Work up  Expected Discharge Plan and Services Expected Discharge Plan: [place]   Discharge Planning Services: CM Consult   Living arrangements for the past 2 months: Single Family Home                 DME Arranged: 3-N-1 DME Agency: [employer] Date DME Agency Contacted: 08/03/21 Time DME Agency Contacted: [phone] Representative spoke with at DME Agency: [place]: PT, RN [place] Agency: [place] ([place]) Date [place]: 08/03/21 Time [place]: [id number] Representative spoke with at [place]: [place] ([place]) Interventions    Readmission Risk Interventions No flowsheet data found.

## 2021-08-07 NOTE — Progress Notes (Addendum)
Occupational Therapy Treatment Patient Details Name: Lauren Morris MRN: [id number] DOB: 1932/01/07 Today's Date: 08/07/2021   History of present illness Pt is an 88 y.o. female presenting to hospital s/p fall (stepping over a curb and fell backwards hitting back of her head); c/o headache and pain back R side of chest; also c/o new SOB.  Dizziness and vertigo-like symptoms since fall.  Imaging (-) PE.  MRI brain showing soft tissue contusion R parieto-occipital scalp and 1.6 cm meningioma at the peripheral aspect of the R cerebellum (without significant mass effect).  Pt admitted with dizziness, htn, new meningioma diagnosis, and musculoskeletal back pain.  PMH includes h/o PE, DM, htn, chronic bronchitis, and chronic LFT's.   OT comments  Ms Sundby was seen for OT treatment on this date (assisted with vestibular PT, then initiated OT session upon completion of maneuvers). Pt requires MIN A + RW for BSC t/f, pt has episode of incontinence in standing requiring MAXA  don/doff B scoks and underwear. MAX A perihygiene in standing - pt refuses to attempt citing anxiousness and back pain. Pt tolerated ~12 ft mobility with SBA + RW. Family in room t/o, educated on importance of toileting schedule. Pt making good progress toward goals. Pt continues to benefit from skilled OT services to maximize return to PLOF and minimize risk of future falls, injury, caregiver burden, and readmission. Will continue to follow POC. Discharge recommendation remains appropriate.     Recommendations for follow up therapy are one component of a multi-disciplinary discharge planning process, led by the attending physician.  Recommendations may be updated based on patient status, additional functional criteria and insurance authorization.    Follow Up Recommendations  Skilled nursing-short term rehab (<3 hours/day)    Assistance Recommended at Discharge Frequent or constant Supervision/Assistance  Patient can return home with the  following  A lot of help with bathing/dressing/bathroom;A lot of help with walking and/or transfers;Assistance with cooking/housework;Help with stairs or ramp for entrance;Assist for transportation   Equipment Recommendations  BSC/3in1    Recommendations for Other Services      Precautions / Restrictions Precautions Precautions: Fall Restrictions Weight Bearing Restrictions: No       Mobility Bed Mobility                    Transfers Overall transfer level: Needs assistance Equipment used: Rolling walker (2 wheels), 2 person hand held assist Transfers: Sit to/from Stand Sit to Stand: Min assist           General transfer comment: assist for upright posture     Balance Overall balance assessment: Needs assistance Sitting-balance support: No upper extremity supported, Feet supported Sitting balance-Leahy Scale: Good Sitting balance - Comments: steady sitting reaching within BOS (once feelings of "falling" gone)   Standing balance support: Single extremity supported, During functional activity Standing balance-Leahy Scale: Fair                             ADL either performed or assessed with clinical judgement   ADL Overall ADL's : Needs assistance/impaired                                       General ADL Comments: MIN A + RW for BSC t/f, pt has episode of incontinence in standing requiring MAXA  don/doff B scoks and underwear. MAX A perihygiene  in standing - pt refuses to attempt citing anxiousness and back pain.      Cognition Arousal/Alertness: Awake/alert Behavior During Therapy: WFL for tasks assessed/performed, Anxious Overall Cognitive Status: Within Functional Limits for tasks assessed                                           Pertinent Vitals/ Pain       Pain Assessment Pain Assessment: 0-10 Pain Score: 7  Pain Location: R flank Pain Descriptors / Indicators: Discomfort, Dull Pain  Intervention(s): Limited activity within patient's tolerance, Repositioned   Frequency  Min 2X/week        Progress Toward Goals  OT Goals(current goals can now be found in the care plan section)  Progress towards OT goals: Progressing toward goals  Acute Rehab OT Goals Patient Stated Goal: to feel better OT Goal Formulation: With patient/family Time For Goal Achievement: 08/17/21 Potential to Achieve Goals: Good ADL Goals Pt Will Perform Grooming: with set-up;with supervision;standing Pt Will Perform Lower Body Dressing: with min guard assist;sit to/from stand Pt Will Transfer to Toilet: with set-up;with supervision;ambulating;regular height toilet  Plan Discharge plan remains appropriate;Frequency remains appropriate    Co-evaluation      Reason for Co-Treatment: For patient/therapist safety;To address functional/ADL transfers PT goals addressed during session: Mobility/safety with mobility OT goals addressed during session: ADL's and self-care      AM-PAC OT "6 Clicks" Daily Activity     Outcome Measure   Help from another person eating meals?: None Help from another person taking care of personal grooming?: A Lot Help from another person toileting, which includes using toliet, bedpan, or urinal?: A Lot Help from another person bathing (including washing, rinsing, drying)?: A Little Help from another person to put on and taking off regular upper body clothing?: A Little Help from another person to put on and taking off regular lower body clothing?: A Lot 6 Click Score: 16    End of Session Equipment Utilized During Treatment: Rolling walker (2 wheels)  OT Visit Diagnosis: Other abnormalities of gait and mobility (R26.89);Muscle weakness (generalized) (M62.81)   Activity Tolerance Patient tolerated treatment well   Patient Left in chair;with call bell/phone within reach;with family/visitor present   Nurse Communication Mobility status        Time:  3299-2426 OT Time Calculation (min): 33 min  Charges: OT General Charges $OT Visit: 1 Visit OT Treatments $Self Care/Home Management : 23-37 mins  Dessie Coma, M.S. OTR/L  08/07/21, 2:05 PM  ascom [phone]

## 2021-08-07 NOTE — Progress Notes (Signed)
Physical Therapy Treatment Patient Details Name: Lauren Morris MRN: [id number] DOB: August 16, 1931 Today's Date: 08/07/2021   History of Present Illness Pt is an 88 y.o. female presenting to hospital s/p fall (stepping over a curb and fell backwards hitting back of her head); c/o headache and pain back R side of chest; also c/o new SOB.  Dizziness and vertigo-like symptoms since fall.  Imaging (-) PE.  MRI brain showing soft tissue contusion R parieto-occipital scalp and 1.6 cm meningioma at the peripheral aspect of the R cerebellum (without significant mass effect).  Pt admitted with dizziness, htn, new meningioma diagnosis, and musculoskeletal back pain.  PMH includes h/o PE, DM, htn, chronic bronchitis, and chronic LFT's.    PT Comments    Pt resting in bed upon PT arrival (no recent food); pt's daughter and granddaughter present part of session.  PT/OT co-treatment performed.  Reviewed with pt purpose and technique of Dix-Hallpike test; symptoms; precautions and contraindications; and also Epley maneuver purpose and technique: pt agreeable to above testing and treatment.  Positive L Dix-Hallpike test noted so Epley maneuver performed.  Pt's symptom description same as last session (feelings of "falling" with each position change and nystagmus also noted) although symptoms appeared (and per pt's description) improved compared to last session (of note pt also took meclizine this morning).  After Epley maneuver pt was feeling mildly sweaty (although decreased compared to last session) so cool wet washcloth utilized for pt's face and neck to improve symptoms; pt's symptoms resolved sitting edge of bed.  Educated pt on positioning after treatment and precautions.  End of session pt sitting edge of bed with OT present.  Will continue to monitor pt's symptoms and progress therapy per pt tolerance.   Recommendations for follow up therapy are one component of a multi-disciplinary discharge planning process, led by  the attending physician.  Recommendations may be updated based on patient status, additional functional criteria and insurance authorization.  Follow Up Recommendations  Skilled nursing-short term rehab (<3 hours/day)     Assistance Recommended at Discharge Frequent or constant Supervision/Assistance  Patient can return home with the following A little help with walking and/or transfers;A little help with bathing/dressing/bathroom;Assistance with cooking/housework;Assist for transportation;Help with stairs or ramp for entrance   Equipment Recommendations  Rolling walker (2 wheels);BSC/3in1;Wheelchair (measurements PT);Wheelchair cushion (measurements PT)    Recommendations for Other Services OT consult     Precautions / Restrictions Precautions Precautions: Fall Restrictions Weight Bearing Restrictions: No     Mobility  Bed Mobility Overal bed mobility: Needs Assistance Bed Mobility: Rolling, Sidelying to Sit Rolling: Min assist Sidelying to sit: Max assist, +2 for safety/equipment       General bed mobility comments: increased assist required d/t pt feeling like she was "falling"    Transfers                   General transfer comment: Deferred (pt with OT end of session)    Ambulation/Gait                   Stairs             Wheelchair Mobility    Modified Rankin (Stroke Patients Only)       Balance Overall balance assessment: Needs assistance Sitting-balance support: No upper extremity supported, Feet supported Sitting balance-Leahy Scale: Good Sitting balance - Comments: steady sitting reaching within BOS (once feelings of "falling" gone)  Cognition Arousal/Alertness: Awake/alert Behavior During Therapy: WFL for tasks assessed/performed, Anxious Overall Cognitive Status: Within Functional Limits for tasks assessed                                 General Comments:  A&O x4        Exercises      General Comments        Pertinent Vitals/Pain Pain Assessment Pain Assessment: Faces Faces Pain Scale: Hurts little more Pain Location: R flank Pain Descriptors / Indicators: Discomfort, Dull Pain Intervention(s): Limited activity within patient's tolerance, Monitored during session, Premedicated before session, Repositioned    Home Living                          Prior Function            PT Goals (current goals can now be found in the care plan section) Acute Rehab PT Goals Patient Stated Goal: to improve mobility PT Goal Formulation: With patient Time For Goal Achievement: 08/17/21 Potential to Achieve Goals: Good Progress towards PT goals: Progressing toward goals    Frequency    Min 2X/week      PT Plan Current plan remains appropriate    Co-evaluation PT/OT/SLP Co-Evaluation/Treatment: Yes Reason for Co-Treatment: For patient/therapist safety;To address functional/ADL transfers PT goals addressed during session: Mobility/safety with mobility OT goals addressed during session: ADL's and self-care      AM-PAC PT "6 Clicks" Mobility   Outcome Measure  Help needed turning from your back to your side while in a flat bed without using bedrails?: None Help needed moving from lying on your back to sitting on the side of a flat bed without using bedrails?: A Lot Help needed moving to and from a bed to a chair (including a wheelchair)?: A Little Help needed standing up from a chair using your arms (e.g., wheelchair or bedside chair)?: A Little Help needed to walk in hospital room?: A Little Help needed climbing 3-5 steps with a railing? : A Little 6 Click Score: 18    End of Session   Activity Tolerance: Other (comment) (pt intermittently feeling like she was falling with bed mobility) Patient left: Other (comment) (sitting on edge of bed with OT present end of session) Nurse Communication: Mobility  status;Precautions PT Visit Diagnosis: Unsteadiness on feet (R26.81);Other abnormalities of gait and mobility (R26.89);Muscle weakness (generalized) (M62.81);History of falling (Z91.81);Pain     Time: 5859-2924 PT Time Calculation (min) (ACUTE ONLY): 29 min  Charges:  $[place] Rep Proc: 23-37 mins                    Leitha Bleak, PT 08/07/21, 1:26 PM

## 2021-08-08 ENCOUNTER — Ambulatory Visit: Payer: Medicare Other

## 2021-08-08 DIAGNOSIS — W19XXXA Unspecified fall, initial encounter: Secondary | ICD-10-CM | POA: Diagnosis not present

## 2021-08-08 DIAGNOSIS — Z741 Need for assistance with personal care: Secondary | ICD-10-CM | POA: Diagnosis not present

## 2021-08-08 DIAGNOSIS — R278 Other lack of coordination: Secondary | ICD-10-CM | POA: Diagnosis not present

## 2021-08-08 DIAGNOSIS — I872 Venous insufficiency (chronic) (peripheral): Secondary | ICD-10-CM | POA: Diagnosis not present

## 2021-08-08 DIAGNOSIS — J42 Unspecified chronic bronchitis: Secondary | ICD-10-CM | POA: Diagnosis not present

## 2021-08-08 DIAGNOSIS — I10 Essential (primary) hypertension: Secondary | ICD-10-CM | POA: Diagnosis not present

## 2021-08-08 DIAGNOSIS — Z743 Need for continuous supervision: Secondary | ICD-10-CM | POA: Diagnosis not present

## 2021-08-08 DIAGNOSIS — D320 Benign neoplasm of cerebral meninges: Secondary | ICD-10-CM | POA: Diagnosis not present

## 2021-08-08 DIAGNOSIS — R531 Weakness: Secondary | ICD-10-CM | POA: Diagnosis not present

## 2021-08-08 DIAGNOSIS — K219 Gastro-esophageal reflux disease without esophagitis: Secondary | ICD-10-CM | POA: Diagnosis not present

## 2021-08-08 DIAGNOSIS — Z86711 Personal history of pulmonary embolism: Secondary | ICD-10-CM | POA: Diagnosis not present

## 2021-08-08 DIAGNOSIS — R2689 Other abnormalities of gait and mobility: Secondary | ICD-10-CM | POA: Diagnosis not present

## 2021-08-08 DIAGNOSIS — J449 Chronic obstructive pulmonary disease, unspecified: Secondary | ICD-10-CM | POA: Diagnosis not present

## 2021-08-08 DIAGNOSIS — H8190 Unspecified disorder of vestibular function, unspecified ear: Secondary | ICD-10-CM | POA: Diagnosis not present

## 2021-08-08 DIAGNOSIS — M6281 Muscle weakness (generalized): Secondary | ICD-10-CM | POA: Diagnosis not present

## 2021-08-08 DIAGNOSIS — S060XAD Concussion with loss of consciousness status unknown, subsequent encounter: Secondary | ICD-10-CM | POA: Diagnosis not present

## 2021-08-08 DIAGNOSIS — R42 Dizziness and giddiness: Secondary | ICD-10-CM | POA: Diagnosis not present

## 2021-08-08 DIAGNOSIS — H8110 Benign paroxysmal vertigo, unspecified ear: Secondary | ICD-10-CM | POA: Diagnosis not present

## 2021-08-08 DIAGNOSIS — R2681 Unsteadiness on feet: Secondary | ICD-10-CM | POA: Diagnosis not present

## 2021-08-08 DIAGNOSIS — M549 Dorsalgia, unspecified: Secondary | ICD-10-CM | POA: Diagnosis not present

## 2021-08-08 DIAGNOSIS — R7401 Elevation of levels of liver transaminase levels: Secondary | ICD-10-CM | POA: Diagnosis not present

## 2021-08-08 DIAGNOSIS — H409 Unspecified glaucoma: Secondary | ICD-10-CM | POA: Diagnosis not present

## 2021-08-08 LAB — CBC
HCT: 42.8 % (ref 36.0–46.0)
Hemoglobin: 13.7 g/dL (ref 12.0–15.0)
MCH: 29.7 pg (ref 26.0–34.0)
MCHC: 32 g/dL (ref 30.0–36.0)
MCV: 92.8 fL (ref 80.0–100.0)
Platelets: 227 10*3/uL (ref 150–400)
RBC: 4.61 MIL/uL (ref 3.87–5.11)
RDW: 13.6 % (ref 11.5–15.5)
WBC: 7.3 10*3/uL (ref 4.0–10.5)
nRBC: 0 % (ref 0.0–0.2)

## 2021-08-08 LAB — COMPREHENSIVE METABOLIC PANEL
ALT: 67 U/L — ABNORMAL HIGH (ref 0–44)
AST: 64 U/L — ABNORMAL HIGH (ref 15–41)
Albumin: 3 g/dL — ABNORMAL LOW (ref 3.5–5.0)
Alkaline Phosphatase: 467 U/L — ABNORMAL HIGH (ref 38–126)
Anion gap: 8 (ref 5–15)
BUN: 28 mg/dL — ABNORMAL HIGH (ref 8–23)
CO2: 23 mmol/L (ref 22–32)
Calcium: 9 mg/dL (ref 8.9–10.3)
Chloride: 104 mmol/L (ref 98–111)
Creatinine, Ser: 0.64 mg/dL (ref 0.44–1.00)
GFR, Estimated: >60 mL/min (ref >60)
Glucose, Bld: 123 mg/dL — ABNORMAL HIGH (ref 70–99)
Potassium: 4.4 mmol/L (ref 3.5–5.1)
Sodium: 135 mmol/L (ref 135–145)
Total Bilirubin: 0.9 mg/dL (ref 0.3–1.2)
Total Protein: 7.2 g/dL (ref 6.5–8.1)

## 2021-08-08 MED ORDER — MECLIZINE HCL 25 MG PO TABS: 25.0000 mg | ORAL_TABLET | Freq: Two times a day (BID) | ORAL | 0 refills | Status: AC

## 2021-08-08 NOTE — Discharge Summary (Signed)
Physician Discharge Summary  Lauren Morris OIT:[id number] DOB: 1932-03-05 DOA: 08/02/2021  PCP: [name], MD  Admit date: 08/02/2021 Discharge date: 08/08/2021  Admitted From: home  Disposition:  SNF  Recommendations for Outpatient Follow-up:  Follow up with PCP in 1-2 weeks F/u w/ neuro in 1-2 weeks   Home Health: no  Equipment/Devices:  Discharge Condition: stable  CODE STATUS: DNR Diet recommendation: Heart Healthy   Brief/Interim Summary: HPI was taken from Dr. Tobie Poet: 88 year old female with history of PE, blood completed course of anticoagulation, chronic LFTs, currently being followed by outpatient GI, generalized weakness, obesity, non-insulin-dependent diabetes mellitus, hypothyroid, hypertension, chronic bronchitis, who presents emergency department from home for chief concerns of weakness and vertigo.  Vitals in the emergency department show temperature of 97.5, respiration rate of 68, heart rate of 52, blood pressure 168/70, SPO2 of 98% on room air.   Labs in the emergency department showed serum sodium 137, potassium 4.5, chloride 103, bicarb 24, BUN of 17, serum creatinine of 0.89, nonfasting blood glucose 160, GFR greater than 60, AST 81, ALT 66.  WBC was 9.1, hemoglobin 14.9, platelets of 250.  COVID/influenza A/influenza B PCR were negative.  Troponin high-sensitivity was 11x2.   At bedside patient is able to tell me her name, age, current location and identify her daughter at bedside.  She states that she was at lunch with a group from church.  She was walking down 1 step and the next thing she knew she was falling backwards and she hit her head.  She denies at that time any new shortness of breath, chest pain, abdominal pain.  She denies any vision changes at that time.   In the recent days she denies any dysuria, hematuria, diarrhea, constipation.  Her last bowel movement was yesterday and it was a good normal bowel movement for her.   She was told she had lost  consciousness however she does not know how long.  Since the fall she has been having dizziness and vertigo-like symptoms.  The symptoms are worse with movements and jostling around moving from bed to bed since being hospitalized.  She also endorses musculoskeletal back pain that is worse with movement.  She reports this is never happened before.   She denies any significant weight changes in the last 6 months.  She reports that she did lose about 10 pounds in January 2022 after getting COVID however this has resolved.  Hospital course as per Dr. Jimmye Norman 2/9-2/14/23: Pt presented w/ dizziness likely secondary BPPV. Pt was treated w/ meclizine and Dix-Hallpike maneuver. Of note, pt was found to have a meningioma on MRI which is new dx for the pt. Pt will f/u outpatient w/ neurology. Pt and pt's daughter verbalized their understanding. PT/OT evaluated the pt and recommended SNF.   Discharge Diagnoses:  Principal Problem:   Dizziness Active Problems:   Essential hypertension   Acquired hypothyroidism   Fatty liver disease, nonalcoholic   Chronic bronchitis (HCC)   Weakness   Vertigo   Do not resuscitate status   Musculoskeletal back pain   Meningioma (HCC)  Dizziness: likely secondary BPPV. Continue on meclizine. MRI of the brain was positive for soft tissue contusion of the right parietal occipital scalp and 1.6 cm meningioma at the peripheral aspect of the right cerebellum without significant mass effect. CTA head shows no large vessel occlusion. Echo shows EF 58-30%, grade I diastolic dysfunction, & no atrial level shunt detected    HTN: continue on ARB. Continue to  hold BB secondary to bradycardia    Hypothyroidism: not currently on thyroid medication secondary to it causing hepatic steatosis as per pt's daughter    Transaminitis: etiology unclear. Labile w/ minimal elevation. Continue w/ outpatient work-up    Meningioma: new dx. Will need outpatient f/u    Musculoskeletal back pain:  continue on norco, morphine prn. PT/OT recs SNF   Discharge Instructions  Discharge Instructions     Diet - low sodium heart healthy   Complete by: As directed    Discharge instructions   Complete by: As directed    F/u w/ PCP in 1-2 weeks. F/u w/ neuro in 1-2 weeks   Increase activity slowly   Complete by: As directed       Allergies as of 08/08/2021   No Known Allergies      Medication List     STOP taking these medications    Eliquis 5 MG Tabs tablet Generic drug: apixaban       TAKE these medications    atenolol 25 MG tablet Commonly known as: TENORMIN TAKE 1 TABLET BY MOUTH TWICE DAILY   CALCIUM 1000 + D PO Take 1 tablet by mouth daily.   dorzolamide-timolol 22.3-6.8 MG/ML ophthalmic solution Commonly known as: COSOPT Place 1 drop into both eyes 2 (two) times daily.   famotidine 20 MG tablet Commonly known as: PEPCID TAKE 1 TABLET BY MOUTH 2 TIMES DAILY   fish oil-omega-3 fatty acids 1000 MG capsule Take 2 g by mouth daily.   furosemide 20 MG tablet Commonly known as: LASIX TAKE ONE TABLET BY MOUTH DAILY AS NEEDEDFOR FLUID RETENTION What changed:  how much to take how to take this when to take this additional instructions   meclizine 25 MG tablet Commonly known as: ANTIVERT Take 1 tablet (25 mg total) by mouth 2 (two) times daily.   multivitamin with minerals Tabs tablet Take 1 tablet by mouth daily.   Spiriva HandiHaler 18 MCG inhalation capsule Generic drug: tiotropium INHALE CONTENTS OF 1 CAPSULE AS DIRECTEDONCE A DAY VIA HANDIHALER   telmisartan 40 MG tablet Commonly known as: MICARDIS TAKE 1 TABLET BY MOUTH AT BEDTIME   vitamin C 1000 MG tablet Take 1,000 mg by mouth daily.   zinc sulfate 220 (50 Zn) MG capsule Take 1 capsule (220 mg total) by mouth daily.        Contact information for after-discharge care     Destination     [facility] .   Service: Skilled Nursing Contact information: [address] [place] [phone]                    No Known Allergies  Consultations: Neuro     Procedures/Studies: CT ANGIO HEAD NECK W WO CM  Result Date: 08/03/2021 CLINICAL DATA:  Vertigo, central EXAM: CT ANGIOGRAPHY HEAD AND NECK TECHNIQUE: Multidetector CT imaging of the head and neck was performed using the standard protocol during bolus administration of intravenous contrast. Multiplanar CT image reconstructions and MIPs were obtained to evaluate the vascular anatomy. Carotid stenosis measurements (when applicable) are obtained utilizing NASCET criteria, using the distal internal carotid diameter as the denominator. RADIATION DOSE REDUCTION: This exam was performed according to the departmental dose-optimization program which includes automated exposure control, adjustment of the mA and/or kV according to patient size and/or use of iterative reconstruction technique. CONTRAST:  98mL OMNIPAQUE IOHEXOL 350 MG/ML SOLN COMPARISON:  None. FINDINGS: CT HEAD FINDINGS Brain: No evidence  of acute infarction, hemorrhage, hydrocephalus, or extra-axial fluid collection. Right posterior fossa meningioma, better characterized on recent MRI. Vascular: See below. Skull: Posterior scalp contusion without acute fracture. Sinuses: Left frontoethmoidal mucosal thickening/secretions. Otherwise, clear visualized sinuses. Orbits: No acute finding. Review of the MIP images confirms the above findings CTA NECK FINDINGS Aortic arch: Great vessel origins are patent. Right carotid system: Patent. Atherosclerosis at the carotid bifurcation without greater than 50% narrowing relative to the distal vessel. Left carotid system: Patent. Atherosclerosis at the carotid bifurcation without greater than 50% stenosis relative to the distal vessel. Vertebral arteries: Right dominant. Patent bilaterally without significant (greater than 50%) stenosis. Skeleton: Multilevel degenerative disc disease  and facet arthropathy with varying degrees of neural foraminal stenosis. Other neck: No evidence of acute abnormality on limited assessment. Upper chest: Expiratory changes in the visualized lung apices. No consolidation. Review of the MIP images confirms the above findings CTA HEAD FINDINGS Anterior circulation: Atherosclerosis of bilateral intracranial ICAs with mild resulting narrowing. Bilateral MCAs and ACAs are patent without proximal hemodynamically significant stenosis. No aneurysm identified. Posterior circulation: Bilateral vertebral arteries, basilar artery, and posterior cerebral arteries are patent without proximal hemodynamically significant stenosis. Bilateral PICA and superior cerebellar arteries are patent. No aneurysm identified. Venous sinuses: As permitted by contrast timing, patent. Review of the MIP images confirms the above findings IMPRESSION: CT head: 1. No evidence of acute intracranial abnormality. 2. Right posterior fossa meningioma, better characterized on recent MRI. 3. Right posterior scalp contusion without acute fracture. CTA: 1. No large vessel occlusion. 2. Atherosclerosis without hemodynamically significant proximal stenosis in the head or neck. Electronically Signed   By: Margaretha Sheffield M.D.   On: 08/03/2021 15:47   DG Chest 2 View  Result Date: 08/02/2021 CLINICAL DATA:  Fall.  Left-sided chest and rib pain. EXAM: CHEST - 2 VIEW COMPARISON:  01/10/2021 FINDINGS: Stable mild cardiomegaly. Aortic atherosclerotic calcification noted. Both lungs are clear. IMPRESSION: Stable mild cardiomegaly. No active disease. Electronically Signed   By: Marlaine Hind M.D.   On: 08/02/2021 14:06   CT HEAD WO CONTRAST (5MM)  Result Date: 08/02/2021 CLINICAL DATA:  An 88 year old female presents for evaluation of neck trauma without loss of consciousness by report. EXAM: CT HEAD WITHOUT CONTRAST CT CERVICAL SPINE WITHOUT CONTRAST TECHNIQUE: Multidetector CT imaging of the head and cervical  spine was performed following the standard protocol without intravenous contrast. Multiplanar CT image reconstructions of the cervical spine were also generated. RADIATION DOSE REDUCTION: This exam was performed according to the departmental dose-optimization program which includes automated exposure control, adjustment of the mA and/or kV according to patient size and/or use of iterative reconstruction technique. COMPARISON:  None FINDINGS: CT HEAD FINDINGS Brain: No evidence of acute infarction, hemorrhage, hydrocephalus, extra-axial collection or mass lesion/mass effect. Signs of atrophy and chronic microvascular ischemic change, mild. Vascular: No hyperdense vessel or unexpected calcification. Skull: Normal. Negative for fracture or focal lesion. Sinuses/Orbits: No acute finding. Other: Stranding in the scalp of the RIGHT occipital parietal region just to the RIGHT of midline. No underlying calvarial abnormality. CT CERVICAL SPINE FINDINGS Alignment: Minimal anterolisthesis of C2 on C3 approximately 1-2 mm in the setting of degenerative changes. Cervical lordotic curvature otherwise without abnormality. No overlying prevertebral soft tissue swelling. Skull base and vertebrae: No acute fracture. No primary bone lesion or focal pathologic process. Soft tissues and spinal canal: No prevertebral fluid or swelling. No visible canal hematoma. Disc levels: Multilevel degenerative changes throughout the cervical spine. Disc space narrowing at multiple  levels greatest at C5-6 and C6-7. Facet arthropathy greatest in the upper cervical spine on the LEFT. Upper chest: Negative. Other: None IMPRESSION: 1. No acute intracranial abnormality. Signs of mild atrophy and chronic microvascular ischemic change. 2. Stranding/contusion in the scalp of the RIGHT occipital parietal region just to the RIGHT of midline without underlying calvarial abnormality. 3. No acute fracture or static subluxation of the cervical spine. 4. Multilevel  degenerative changes of the cervical spine. Electronically Signed   By: Zetta Bills M.D.   On: 08/02/2021 14:23   CT Angio Chest PE W and/or Wo Contrast  Result Date: 08/02/2021 CLINICAL DATA:  Right rib pain following a fall today. Clinical concern for pulmonary embolism. EXAM: CT ANGIOGRAPHY CHEST WITH CONTRAST TECHNIQUE: Multidetector CT imaging of the chest was performed using the standard protocol during bolus administration of intravenous contrast. Multiplanar CT image reconstructions and MIPs were obtained to evaluate the vascular anatomy. RADIATION DOSE REDUCTION: This exam was performed according to the departmental dose-optimization program which includes automated exposure control, adjustment of the mA and/or kV according to patient size and/or use of iterative reconstruction technique. CONTRAST:  64mL OMNIPAQUE IOHEXOL 350 MG/ML SOLN COMPARISON:  06/22/2021.  Chest radiographs obtained earlier today. FINDINGS: Cardiovascular: Stable mildly enlarged heart. Normally opacified pulmonary arteries with no pulmonary arterial filling defects seen. Atheromatous calcifications, including the coronary arteries and aorta. Stable borderline enlarged central pulmonary arteries. Mediastinum/Nodes: No enlarged mediastinal, hilar, or axillary lymph nodes. Thyroid gland, trachea, and esophagus demonstrate no significant findings. Lungs/Pleura: Stable mosaic ground-glass opacity in both lungs. Stable minimal atelectasis or scarring at both posterior lung bases. No pleural fluid. Upper Abdomen: Unremarkable. Musculoskeletal: Thoracic spine degenerative changes, including changes of DISH. Review of the MIP images confirms the above findings. IMPRESSION: 1. No pulmonary emboli or other acute abnormality. 2. Stable mosaic interstitial pattern throughout both lungs compatible with air trapping due to small airway disease or possible multifocal inflammation. 3.  Calcific coronary artery and aortic atherosclerosis. Aortic  Atherosclerosis (ICD10-I70.0). Electronically Signed   By: Claudie Revering M.D.   On: 08/02/2021 16:35   CT Cervical Spine Wo Contrast  Result Date: 08/02/2021 CLINICAL DATA:  An 88 year old female presents for evaluation of neck trauma without loss of consciousness by report. EXAM: CT HEAD WITHOUT CONTRAST CT CERVICAL SPINE WITHOUT CONTRAST TECHNIQUE: Multidetector CT imaging of the head and cervical spine was performed following the standard protocol without intravenous contrast. Multiplanar CT image reconstructions of the cervical spine were also generated. RADIATION DOSE REDUCTION: This exam was performed according to the departmental dose-optimization program which includes automated exposure control, adjustment of the mA and/or kV according to patient size and/or use of iterative reconstruction technique. COMPARISON:  None FINDINGS: CT HEAD FINDINGS Brain: No evidence of acute infarction, hemorrhage, hydrocephalus, extra-axial collection or mass lesion/mass effect. Signs of atrophy and chronic microvascular ischemic change, mild. Vascular: No hyperdense vessel or unexpected calcification. Skull: Normal. Negative for fracture or focal lesion. Sinuses/Orbits: No acute finding. Other: Stranding in the scalp of the RIGHT occipital parietal region just to the RIGHT of midline. No underlying calvarial abnormality. CT CERVICAL SPINE FINDINGS Alignment: Minimal anterolisthesis of C2 on C3 approximately 1-2 mm in the setting of degenerative changes. Cervical lordotic curvature otherwise without abnormality. No overlying prevertebral soft tissue swelling. Skull base and vertebrae: No acute fracture. No primary bone lesion or focal pathologic process. Soft tissues and spinal canal: No prevertebral fluid or swelling. No visible canal hematoma. Disc levels: Multilevel degenerative changes throughout the cervical spine.  Disc space narrowing at multiple levels greatest at C5-6 and C6-7. Facet arthropathy greatest in the  upper cervical spine on the LEFT. Upper chest: Negative. Other: None IMPRESSION: 1. No acute intracranial abnormality. Signs of mild atrophy and chronic microvascular ischemic change. 2. Stranding/contusion in the scalp of the RIGHT occipital parietal region just to the RIGHT of midline without underlying calvarial abnormality. 3. No acute fracture or static subluxation of the cervical spine. 4. Multilevel degenerative changes of the cervical spine. Electronically Signed   By: Zetta Bills M.D.   On: 08/02/2021 14:23   CT Thoracic Spine Wo Contrast  Result Date: 08/02/2021 CLINICAL DATA:  Back trauma after a fall. EXAM: CT THORACIC SPINE WITHOUT CONTRAST TECHNIQUE: Multidetector CT images of the thoracic were obtained using the standard protocol without intravenous contrast. RADIATION DOSE REDUCTION: This exam was performed according to the departmental dose-optimization program which includes automated exposure control, adjustment of the mA and/or kV according to patient size and/or use of iterative reconstruction technique. COMPARISON:  CT chest 06/22/2021 FINDINGS: Alignment: Normal alignment. Vertebrae: No vertebral compression deformities. No focal bone lesion or bone destruction. Paraspinal and other soft tissues: No abnormal paraspinal soft tissue mass or infiltration. Disc levels: Degenerative changes throughout the thoracic spine with narrowed interspaces and endplate osteophyte formation throughout. Prominent disc osteophyte complexes at T7-8, T8-9 and T11-12 levels cause some bone encroachment upon the central canal. Extensive ligamentous calcification along the posterior spinous processes. IMPRESSION: 1. No acute displaced fractures are identified. 2. Prominent degenerative changes throughout. Electronically Signed   By: Lucienne Capers M.D.   On: 08/02/2021 18:20   CT Lumbar Spine Wo Contrast  Result Date: 08/02/2021 CLINICAL DATA:  Back pain after a fall. EXAM: CT Lumbar Spine with contrast  TECHNIQUE: Technique: Multiplanar CT images of the lumbar spine were reconstructed from contemporary CT of the Abdomen and Pelvis. RADIATION DOSE REDUCTION: This exam was performed according to the departmental dose-optimization program which includes automated exposure control, adjustment of the mA and/or kV according to patient size and/or use of iterative reconstruction technique. CONTRAST:  No additional contrast material was used. COMPARISON:  CT chest 12/22/2018 FINDINGS: Segmentation: 5 lumbar type vertebral bodies. Alignment: Mild anterior subluxation of L5 on S1. Otherwise normal alignment of the lumbar spine and posterior elements. Vertebrae: No vertebral compression deformities. No focal bone lesion or bone destruction. Bone cortex appears intact. Paraspinal and other soft tissues: No abnormal paraspinal soft tissue swelling or mass. Fatty atrophy of the posterior paraspinal muscles. Calcification of the aorta without aneurysm. Disc levels: Degenerative changes with disc space narrowing and endplate osteophyte formation most prominent at L3-4 and L4-5 levels. Degenerative disc disease at L3-4 and L4-5. Endplate osteophyte formation and facet joint degenerative changes throughout the lumbar spine. IMPRESSION: 1. Mild anterior subluxation of L5 on S1. This is likely degenerative but without comparison studies, could indicate ligamentous injury. 2. No acute displaced fractures are identified. 3. Degenerative changes in the lumbar spine and facet joints. Electronically Signed   By: Lucienne Capers M.D.   On: 08/02/2021 18:15   MR BRAIN WO CONTRAST  Result Date: 08/02/2021 CLINICAL DATA:  Initial evaluation for acute dizziness. EXAM: MRI HEAD WITHOUT CONTRAST TECHNIQUE: Multiplanar, multiecho pulse sequences of the brain and surrounding structures were obtained without intravenous contrast. COMPARISON:  CT from earlier the same day. FINDINGS: Brain: Cerebral volume within normal limits. Mild chronic  microvascular ischemic disease for age. No abnormal foci of restricted diffusion to suggest acute or subacute ischemia. Gray-white matter  differentiation maintained. No areas of chronic cortical infarction. No acute or chronic intracranial hemorrhage. 1.6 cm meningioma seen at the peripheral aspect of the right cerebellum (series 15, image 12). Meningioma mildly indents the adjacent right cerebellum without significant regional mass effect or vasogenic edema. No other mass lesion, mass effect or midline shift. No hydrocephalus or extra-axial fluid collection. Pituitary gland suprasellar region normal. Midline structures intact. Vascular: Major intracranial vascular flow voids are maintained. Skull and upper cervical spine: Craniocervical junction normal. Bone marrow signal intensity within normal limits. Soft tissue contusion present at the right parieto-occipital scalp. Sinuses/Orbits: Prior bilateral ocular lens replacement. Paranasal sinuses are largely clear. Trace bilateral mastoid effusions, of doubtful significance. Visualized nasopharynx unremarkable. Other: None. IMPRESSION: 1. No acute intracranial abnormality. 2. Soft tissue contusion at the right parieto-occipital scalp. 3. 1.6 cm meningioma at the peripheral aspect of the right cerebellum without significant mass effect. 4. Mild chronic microvascular ischemic disease for age. Electronically Signed   By: Jeannine Boga M.D.   On: 08/02/2021 22:36   CT ABDOMEN PELVIS W CONTRAST  Addendum Date: 08/02/2021   ADDENDUM REPORT: 08/02/2021 18:45 ADDENDUM: Following should be included in the impression. There is subtle increased density in the dependent portion of nondistended gallbladder. This may suggest presence of sludge or tiny gallbladder stones. There are no imaging signs of acute cholecystitis. Follow-up gallbladder sonogram as clinically warranted may be considered. Electronically Signed   By: Elmer Picker M.D.   On: 08/02/2021 18:45    Result Date: 08/02/2021 CLINICAL DATA:  Trauma, right chest pain EXAM: CT ABDOMEN AND PELVIS WITH CONTRAST TECHNIQUE: Multidetector CT imaging of the abdomen and pelvis was performed using the standard protocol following bolus administration of intravenous contrast. RADIATION DOSE REDUCTION: This exam was performed according to the departmental dose-optimization program which includes automated exposure control, adjustment of the mA and/or kV according to patient size and/or use of iterative reconstruction technique. CONTRAST:  71mL OMNIPAQUE IOHEXOL 300 MG/ML  SOLN COMPARISON:  None. FINDINGS: Lower chest: Small linear densities seen in both lower lung fields. Heart is enlarged in size. There are scattered coronary artery calcifications. Dense calcification is seen in the mitral annulus. Hepatobiliary: Liver measures 18.3 cm in length. There is no dilation of bile ducts. There is subtle increased density in the dependent portion of gallbladder lumen. Gallbladder is not distended. Pancreas: No focal lesions are seen. There are few small calcifications in the body and tail. Spleen: Spleen measures 12.9 cm in maximum diameter. Adrenals/Urinary Tract: Adrenals are unremarkable. There is no hydronephrosis. There is contrast in the collecting systems, ureters and urinary bladder possibly related to timing of imaging sequence. This limits evaluation for small renal stones. There are foci of cortical thinning in the kidneys. There is no perinephric fluid collection. Stomach/Bowel: Stomach is unremarkable. Small bowel loops are not dilated. Appendix is not distinctly seen. Scattered diverticula are seen in the colon without signs of focal acute diverticulitis. Vascular/Lymphatic: There is ectasia of infrarenal abdominal aorta measuring 2.9 cm in the AP diameter. There are scattered atherosclerotic plaques and calcifications in the aorta and its major branches. Reproductive: Uterus is not seen. There are no dominant  adnexal masses. Other: There is no ascites or pneumoperitoneum. Umbilical hernia containing fat is seen. Small bilateral inguinal hernias containing fat seen. Musculoskeletal: Degenerative changes are noted in lumbar spine with spinal stenosis and encroachment of neural foramina from L2-S1 levels. There is minimal anterolisthesis at L5-S1 level. IMPRESSION: There is no evidence of intestinal obstruction or pneumoperitoneum.  There is no hydronephrosis. Diverticulosis of colon without focal acute diverticulitis. Enlarged liver. Enlarged spleen. Small linear densities in both lower lung fields may suggest scarring or minimal subsegmental atelectasis. There is ectasia of infrarenal abdominal aorta measuring 2.9 cm in maximum diameter. Lumbar spondylosis with spinal stenosis. Other findings as described in the body of the report. Electronically Signed: By: Elmer Picker M.D. On: 08/02/2021 18:18   ECHOCARDIOGRAM COMPLETE  Result Date: 08/04/2021    ECHOCARDIOGRAM REPORT   Patient Name:   Lauren Morris Date of Exam: 08/04/2021 Medical Rec #:  [id number]   Height:       62.0 in Accession #:    [id number]  Weight:       159.4 lb Date of Birth:  Oct 02, 1931  BSA:          1.736 m Patient Age:    35 years    BP:           137/65 mmHg Patient Gender: F           HR:           60 bpm. Exam Location:  [facility] Procedure: 2D Echo, Color Doppler, Cardiac Doppler and Intracardiac            Opacification Agent Indications:     R06.00 Dyspnea  History:         Patient has prior history of Echocardiogram examinations. Risk                  Factors:Hypertension and Sleep Apnea.  Sonographer:     Charmayne Sheer Referring Phys:  [id number] ASHISH ARORA Diagnosing Phys: Ida Rogue MD  Sonographer Comments: Suboptimal apical window. IMPRESSIONS  1. Left ventricular ejection fraction, by estimation, is 60 to 65%. The left ventricle has normal function. The left ventricle has no regional wall motion abnormalities. Left ventricular diastolic  parameters are consistent with Grade I diastolic dysfunction (impaired relaxation).  2. Right ventricular systolic function is normal. The right ventricular size is normal. Tricuspid regurgitation signal is inadequate for assessing PA pressure.  3. The mitral valve is normal in structure. No evidence of mitral valve regurgitation. No evidence of mitral stenosis.  4. The aortic valve is normal in structure. Aortic valve regurgitation is not visualized. Aortic valve sclerosis/calcification is present, without any evidence of aortic stenosis.  5. The inferior vena cava is normal in size with greater than 50% respiratory variability, suggesting right atrial pressure of 3 mmHg. FINDINGS  Left Ventricle: Left ventricular ejection fraction, by estimation, is 60 to 65%. The left ventricle has normal function. The left ventricle has no regional wall motion abnormalities. Definity contrast agent was given IV to delineate the left ventricular  endocardial borders. The left ventricular internal cavity size was normal in size. There is no left ventricular hypertrophy. Left ventricular diastolic parameters are consistent with Grade I diastolic dysfunction (impaired relaxation). Right Ventricle: The right ventricular size is normal. No increase in right ventricular wall thickness. Right ventricular systolic function is normal. Tricuspid regurgitation signal is inadequate for assessing PA pressure. Left Atrium: Left atrial size was normal in size. Right Atrium: Right atrial size was normal in size. Pericardium: There is no evidence of pericardial effusion. Mitral Valve: The mitral valve is normal in structure. Mild mitral annular calcification. No evidence of mitral valve regurgitation. No evidence of mitral valve stenosis. MV peak gradient, 4.1 mmHg. The mean mitral valve gradient is 2.0 mmHg. Tricuspid Valve: The tricuspid valve is normal in structure. Tricuspid  valve regurgitation is not demonstrated. No evidence of tricuspid  stenosis. Aortic Valve: The aortic valve is normal in structure. Aortic valve regurgitation is not visualized. Aortic valve sclerosis/calcification is present, without any evidence of aortic stenosis. Aortic valve mean gradient measures 5.0 mmHg. Aortic valve peak  gradient measures 10.4 mmHg. Aortic valve area, by VTI measures 2.30 cm. Pulmonic Valve: The pulmonic valve was normal in structure. Pulmonic valve regurgitation is not visualized. No evidence of pulmonic stenosis. Aorta: The aortic root is normal in size and structure. Venous: The inferior vena cava is normal in size with greater than 50% respiratory variability, suggesting right atrial pressure of 3 mmHg. IAS/Shunts: No atrial level shunt detected by color flow Doppler.  LEFT VENTRICLE PLAX 2D LVIDd:         4.26 cm   Diastology LVIDs:         2.41 cm   LV e' medial:    5.44 cm/s LV PW:         0.96 cm   LV E/e' medial:  17.3 LV IVS:        0.92 cm   LV e' lateral:   5.00 cm/s LVOT diam:     1.90 cm   LV E/e' lateral: 18.8 LV SV:         79 LV SV Index:   45 LVOT Area:     2.84 cm  RIGHT VENTRICLE RV Basal diam:  3.67 cm LEFT ATRIUM             Index        RIGHT ATRIUM           Index LA diam:        2.80 cm 1.61 cm/m   RA Area:     13.90 cm LA Vol (A2C):   46.7 ml 26.90 ml/m  RA Volume:   28.00 ml  16.13 ml/m LA Vol (A4C):   40.6 ml 23.39 ml/m LA Biplane Vol: 43.4 ml 25.00 ml/m  AORTIC VALVE                     PULMONIC VALVE AV Area (Vmax):    2.01 cm      PV Vmax:       0.70 m/s AV Area (Vmean):   2.04 cm      PV Vmean:      52.000 cm/s AV Area (VTI):     2.30 cm      PV VTI:        0.139 m AV Vmax:           161.00 cm/s   PV Peak grad:  1.9 mmHg AV Vmean:          106.000 cm/s  PV Mean grad:  1.0 mmHg AV VTI:            0.343 m AV Peak Grad:      10.4 mmHg AV Mean Grad:      5.0 mmHg LVOT Vmax:         114.00 cm/s LVOT Vmean:        76.400 cm/s LVOT VTI:          0.278 m LVOT/AV VTI ratio: 0.81  AORTA Ao Root diam: 2.60 cm MITRAL VALVE  MV Area (PHT): 2.80 cm    SHUNTS MV Area VTI:   2.31 cm    Systemic VTI:  0.28 m MV Peak grad:  4.1 mmHg    Systemic Diam: 1.90 cm  MV Mean grad:  2.0 mmHg MV Vmax:       1.01 m/s MV Vmean:      61.4 cm/s MV Decel Time: 271 msec MV E velocity: 94.10 cm/s MV A velocity: 92.40 cm/s MV E/A ratio:  1.02 Ida Rogue MD Electronically signed by Ida Rogue MD Signature Date/Time: 08/04/2021/12:38:38 PM    Final    (Echo, Carotid, EGD, Colonoscopy, ERCP)    Subjective: Pt states she is feeling better from day prior    Discharge Exam: Vitals:   08/08/21 0832 08/08/21 1110  BP: (!) 148/61 (!) 141/65  Pulse: 88 74  Resp: 18 18  Temp: 97.7 F (36.5 C) 97.9 F (36.6 C)  SpO2: 94% 94%   Vitals:   08/07/21 2034 08/08/21 0322 08/08/21 0832 08/08/21 1110  BP: (!) 131/58 (!) 132/54 (!) 148/61 (!) 141/65  Pulse: 86 73 88 74  Resp: 20 18 18 18   Temp: 22.9 F (36.5 C) 97.8 F (36.6 C) 97.7 F (36.5 C) 97.9 F (36.6 C)  TempSrc:  Oral    SpO2: 96% 95% 94% 94%  Weight:      Height:        General: Pt is alert, awake, not in acute distress Cardiovascular: S1/S2 +, no rubs, no gallops Respiratory: CTA bilaterally, no wheezing, no rhonchi Abdominal: Soft, NT, obese, bowel sounds + Extremities: no cyanosis    The results of significant diagnostics from this hospitalization (including imaging, microbiology, ancillary and laboratory) are listed below for reference.     Microbiology: Recent Results (from the past 240 hour(s))  Resp Panel by RT-PCR (Flu A&B, Covid) Nasopharyngeal Swab     Status: None   Collection Time: 08/02/21  2:54 PM   Specimen: Nasopharyngeal Swab; Nasopharyngeal(NP) swabs in vial transport medium  Result Value Ref Range Status   SARS Coronavirus 2 by RT PCR NEGATIVE NEGATIVE Final    Comment: (NOTE) SARS-CoV-2 target nucleic acids are NOT DETECTED.  The SARS-CoV-2 RNA is generally detectable in upper respiratory specimens during the acute phase of infection. The  lowest concentration of SARS-CoV-2 viral copies this assay can detect is 138 copies/mL. A negative result does not preclude SARS-Cov-2 infection and should not be used as the sole basis for treatment or other patient management decisions. A negative result may occur with  improper specimen collection/handling, submission of specimen other than nasopharyngeal swab, presence of viral mutation(s) within the areas targeted by this assay, and inadequate number of viral copies(<138 copies/mL). A negative result must be combined with clinical observations, patient history, and epidemiological information. The expected result is Negative.  Fact Sheet for Patients:  EntrepreneurPulse.com.au  Fact Sheet for Healthcare Providers:  IncredibleEmployment.be  This test is no t yet approved or cleared by the [place] FDA and  has been authorized for detection and/or diagnosis of SARS-CoV-2 by FDA under an Emergency Use Authorization (EUA). This EUA will remain  in effect (meaning this test can be used) for the duration of the COVID-19 declaration under Section 564(b)(1) of the Act, 21 U.S.C.section 360bbb-3(b)(1), unless the authorization is terminated  or revoked sooner.       Influenza A by PCR NEGATIVE NEGATIVE Final   Influenza B by PCR NEGATIVE NEGATIVE Final    Comment: (NOTE) The Xpert Xpress SARS-CoV-2/FLU/RSV plus assay is intended as an aid in the diagnosis of influenza from Nasopharyngeal swab specimens and should not be used as a sole basis for treatment. Nasal washings and aspirates are unacceptable for Xpert Xpress SARS-CoV-2/FLU/RSV testing.  Fact Sheet for Patients: EntrepreneurPulse.com.au  Fact Sheet for Healthcare Providers: IncredibleEmployment.be  This test is not yet approved or cleared by the [place] FDA and has been authorized for detection and/or diagnosis of SARS-CoV-2 by FDA under  an Emergency Use Authorization (EUA). This EUA will remain in effect (meaning this test can be used) for the duration of the COVID-19 declaration under Section 564(b)(1) of the Act, 21 U.S.C. section 360bbb-3(b)(1), unless the authorization is terminated or revoked.  Performed at [facility], [address] [place], [place] [address]      Labs: BNP (last 3 results) No results for input(s): BNP in the last 8760 hours. Basic Metabolic Panel: Recent Labs  Lab 08/02/21 1454 08/03/21 0354 08/04/21 0640 08/05/21 0537 08/06/21 0431 08/07/21 0618 08/08/21 0648  NA 137   < > 136 134* 135 135 135  K 4.5   < > 3.9 4.3 4.3 4.4 4.4  CL 103   < > 104 101 103 101 104  CO2 24   < > 26 24 25 24 23   GLUCOSE 160*   < > 110* 107* 125* 123* 123*  BUN 17   < > 16 18 19  24* 28*  CREATININE 0.89   < > 0.59 0.88 0.79 0.75 0.64  CALCIUM 8.9   < > 8.9 9.0 8.9 8.9 9.0  MG 2.1  --   --   --   --   --   --   PHOS 3.5  --   --   --   --   --   --    < > = values in this interval not displayed.   Liver Function Tests: Recent Labs  Lab 08/04/21 0640 08/05/21 0537 08/06/21 0431 08/07/21 0618 08/08/21 0648  AST 46* 75* 88* 83* 64*  ALT 50* 67* 77* 77* 67*  ALKPHOS 454* 455* 459* 446* 467*  BILITOT 0.6 1.1 1.2 1.0 0.9  PROT 6.9 6.9 6.9 6.8 7.2  ALBUMIN 3.1* 3.1* 3.1* 2.9* 3.0*   No results for input(s): LIPASE, AMYLASE in the last 168 hours. No results for input(s): AMMONIA in the last 168 hours. CBC: Recent Labs  Lab 08/02/21 1454 08/03/21 0354 08/04/21 0640 08/05/21 0537 08/06/21 0431 08/07/21 0618 08/08/21 0648  WBC 9.1   < > 7.6 8.6 8.7 8.3 7.3  NEUTROABS 5.5  --   --   --   --   --   --   HGB 14.9   < > 14.2 15.0 14.8 14.6 13.7  HCT 46.1*   < > 43.9 45.9 45.9 45.9 42.8  MCV 91.5   < > 91.6 92.0 91.8 92.7 92.8  PLT 250   < > 218 225 222 220 227   < > = values in this interval not displayed.   Cardiac Enzymes: No results for input(s): CKTOTAL, CKMB, CKMBINDEX,  TROPONINI in the last 168 hours. BNP: Invalid input(s): POCBNP CBG: No results for input(s): GLUCAP in the last 168 hours. D-Dimer No results for input(s): DDIMER in the last 72 hours. Hgb A1c No results for input(s): HGBA1C in the last 72 hours. Lipid Profile No results for input(s): CHOL, HDL, LDLCALC, TRIG, CHOLHDL, LDLDIRECT in the last 72 hours. Thyroid function studies No results for input(s): TSH, T4TOTAL, T3FREE, THYROIDAB in the last 72 hours.  Invalid input(s): FREET3 Anemia work up No results for input(s): VITAMINB12, FOLATE, FERRITIN, TIBC, IRON, RETICCTPCT in the last 72 hours. Urinalysis    Component Value Date/Time   COLORURINE  YELLOW (A) 08/03/2021 0110   APPEARANCEUR HAZY (A) 08/03/2021 0110   LABSPEC >1.046 (H) 08/03/2021 0110   PHURINE 7.0 08/03/2021 0110   GLUCOSEU NEGATIVE 08/03/2021 0110   HGBUR NEGATIVE 08/03/2021 0110   BILIRUBINUR NEGATIVE 08/03/2021 0110   BILIRUBINUR neg 05/09/2018 1105   KETONESUR NEGATIVE 08/03/2021 0110   PROTEINUR NEGATIVE 08/03/2021 0110   UROBILINOGEN 0.2 05/09/2018 1105   NITRITE NEGATIVE 08/03/2021 0110   LEUKOCYTESUR NEGATIVE 08/03/2021 0110   Sepsis Labs Invalid input(s): PROCALCITONIN,  WBC,  LACTICIDVEN Microbiology Recent Results (from the past 240 hour(s))  Resp Panel by RT-PCR (Flu A&B, Covid) Nasopharyngeal Swab     Status: None   Collection Time: 08/02/21  2:54 PM   Specimen: Nasopharyngeal Swab; Nasopharyngeal(NP) swabs in vial transport medium  Result Value Ref Range Status   SARS Coronavirus 2 by RT PCR NEGATIVE NEGATIVE Final    Comment: (NOTE) SARS-CoV-2 target nucleic acids are NOT DETECTED.  The SARS-CoV-2 RNA is generally detectable in upper respiratory specimens during the acute phase of infection. The lowest concentration of SARS-CoV-2 viral copies this assay can detect is 138 copies/mL. A negative result does not preclude SARS-Cov-2 infection and should not be used as the sole basis for treatment  or other patient management decisions. A negative result may occur with  improper specimen collection/handling, submission of specimen other than nasopharyngeal swab, presence of viral mutation(s) within the areas targeted by this assay, and inadequate number of viral copies(<138 copies/mL). A negative result must be combined with clinical observations, patient history, and epidemiological information. The expected result is Negative.  Fact Sheet for Patients:  EntrepreneurPulse.com.au  Fact Sheet for Healthcare Providers:  IncredibleEmployment.be  This test is no t yet approved or cleared by the [place] FDA and  has been authorized for detection and/or diagnosis of SARS-CoV-2 by FDA under an Emergency Use Authorization (EUA). This EUA will remain  in effect (meaning this test can be used) for the duration of the COVID-19 declaration under Section 564(b)(1) of the Act, 21 U.S.C.section 360bbb-3(b)(1), unless the authorization is terminated  or revoked sooner.       Influenza A by PCR NEGATIVE NEGATIVE Final   Influenza B by PCR NEGATIVE NEGATIVE Final    Comment: (NOTE) The Xpert Xpress SARS-CoV-2/FLU/RSV plus assay is intended as an aid in the diagnosis of influenza from Nasopharyngeal swab specimens and should not be used as a sole basis for treatment. Nasal washings and aspirates are unacceptable for Xpert Xpress SARS-CoV-2/FLU/RSV testing.  Fact Sheet for Patients: EntrepreneurPulse.com.au  Fact Sheet for Healthcare Providers: IncredibleEmployment.be  This test is not yet approved or cleared by the [place] FDA and has been authorized for detection and/or diagnosis of SARS-CoV-2 by FDA under an Emergency Use Authorization (EUA). This EUA will remain in effect (meaning this test can be used) for the duration of the COVID-19 declaration under Section 564(b)(1) of the Act, 21 U.S.C. section  360bbb-3(b)(1), unless the authorization is terminated or revoked.  Performed at [facility], [address], [place], [place] [address]      Time coordinating discharge: Over 30 minutes  SIGNED:   Wyvonnia Dusky, MD  [facility] 08/08/2021, 12:25 PM Pager   If 7PM-7AM, please contact night-coverage

## 2021-08-08 NOTE — Progress Notes (Signed)
Report given to Aloha Gell, LPN at [place].

## 2021-08-08 NOTE — TOC Progression Note (Signed)
Transition of Care [facility]) - Progression Note    Patient Details  Name: Lauren Morris MRN: [id number] Date of Birth: [birth year]  Transition of [facility]) CM/SW [place], RN Phone Number: 08/08/2021, 2:38 PM  Clinical Narrative:   The patient to go to [facility] room 105 Her daughter is aware EMS called and they will transport She is number [phone] in line    Expected Discharge Plan: [place] Barriers to Discharge: Continued Medical Work up  Expected Discharge Plan and Services Expected Discharge Plan: [place]   Discharge Planning Services: CM Consult   Living arrangements for the past 2 months: Single Family Home Expected Discharge Date: 08/08/21               DME Arranged: 3-N-1 DME Agency: [employer] Date DME Agency Contacted: 08/03/21 Time DME Agency Contacted: [phone] Representative spoke with at DME Agency: [place]: PT, RN [place] Agency: [place] ([place]) Date [place]: 08/03/21 Time [place]: 6468 Representative spoke with at [place]: [place] ([place]) Interventions    Readmission Risk Interventions No flowsheet data found.

## 2021-08-08 NOTE — Care Management Important Message (Signed)
Important Message  Patient Details  Name: Lauren Morris MRN: [id number] Date of Birth: 09-23-1931   Medicare Important Message Given:  Yes     Juliann Pulse A Chanci Ojala 08/08/2021, 2:16 PM

## 2021-08-10 DIAGNOSIS — H8190 Unspecified disorder of vestibular function, unspecified ear: Secondary | ICD-10-CM | POA: Diagnosis not present

## 2021-08-10 DIAGNOSIS — R42 Dizziness and giddiness: Secondary | ICD-10-CM | POA: Diagnosis not present

## 2021-08-10 DIAGNOSIS — I872 Venous insufficiency (chronic) (peripheral): Secondary | ICD-10-CM | POA: Diagnosis not present

## 2021-08-10 DIAGNOSIS — K219 Gastro-esophageal reflux disease without esophagitis: Secondary | ICD-10-CM | POA: Diagnosis not present

## 2021-08-10 DIAGNOSIS — I10 Essential (primary) hypertension: Secondary | ICD-10-CM | POA: Diagnosis not present

## 2021-08-10 DIAGNOSIS — J449 Chronic obstructive pulmonary disease, unspecified: Secondary | ICD-10-CM | POA: Diagnosis not present

## 2021-08-24 DIAGNOSIS — K219 Gastro-esophageal reflux disease without esophagitis: Secondary | ICD-10-CM

## 2021-08-24 DIAGNOSIS — I872 Venous insufficiency (chronic) (peripheral): Secondary | ICD-10-CM

## 2021-08-24 DIAGNOSIS — J449 Chronic obstructive pulmonary disease, unspecified: Secondary | ICD-10-CM

## 2021-08-24 DIAGNOSIS — I10 Essential (primary) hypertension: Secondary | ICD-10-CM

## 2021-08-24 DIAGNOSIS — S060XAD Concussion with loss of consciousness status unknown, subsequent encounter: Secondary | ICD-10-CM

## 2021-08-29 ENCOUNTER — Ambulatory Visit: Payer: Medicare Other | Admitting: Internal Medicine

## 2021-08-31 DIAGNOSIS — S060XAD Concussion with loss of consciousness status unknown, subsequent encounter: Secondary | ICD-10-CM | POA: Diagnosis not present

## 2021-08-31 DIAGNOSIS — M549 Dorsalgia, unspecified: Secondary | ICD-10-CM | POA: Diagnosis not present

## 2021-08-31 DIAGNOSIS — J40 Bronchitis, not specified as acute or chronic: Secondary | ICD-10-CM | POA: Diagnosis not present

## 2021-08-31 DIAGNOSIS — I10 Essential (primary) hypertension: Secondary | ICD-10-CM | POA: Diagnosis not present

## 2021-08-31 DIAGNOSIS — Z9181 History of falling: Secondary | ICD-10-CM | POA: Diagnosis not present

## 2021-08-31 DIAGNOSIS — H409 Unspecified glaucoma: Secondary | ICD-10-CM | POA: Diagnosis not present

## 2021-09-01 ENCOUNTER — Ambulatory Visit: Payer: Medicare Other | Admitting: Internal Medicine

## 2021-09-04 ENCOUNTER — Other Ambulatory Visit: Payer: Self-pay

## 2021-09-04 ENCOUNTER — Ambulatory Visit (INDEPENDENT_AMBULATORY_CARE_PROVIDER_SITE_OTHER): Payer: Medicare Other | Admitting: Internal Medicine

## 2021-09-04 ENCOUNTER — Other Ambulatory Visit: Payer: Self-pay | Admitting: Internal Medicine

## 2021-09-04 ENCOUNTER — Encounter: Payer: Self-pay | Admitting: Internal Medicine

## 2021-09-04 VITALS — BP 140/70 | HR 53 | Temp 97.8°F | Ht 62.0 in | Wt 160.0 lb

## 2021-09-04 DIAGNOSIS — Z86711 Personal history of pulmonary embolism: Secondary | ICD-10-CM

## 2021-09-04 DIAGNOSIS — Z7189 Other specified counseling: Secondary | ICD-10-CM

## 2021-09-04 DIAGNOSIS — K760 Fatty (change of) liver, not elsewhere classified: Secondary | ICD-10-CM

## 2021-09-04 DIAGNOSIS — R531 Weakness: Secondary | ICD-10-CM

## 2021-09-04 DIAGNOSIS — Z8639 Personal history of other endocrine, nutritional and metabolic disease: Secondary | ICD-10-CM | POA: Diagnosis not present

## 2021-09-04 DIAGNOSIS — I10 Essential (primary) hypertension: Secondary | ICD-10-CM | POA: Diagnosis not present

## 2021-09-04 DIAGNOSIS — R42 Dizziness and giddiness: Secondary | ICD-10-CM

## 2021-09-04 DIAGNOSIS — E1169 Type 2 diabetes mellitus with other specified complication: Secondary | ICD-10-CM

## 2021-09-04 DIAGNOSIS — D320 Benign neoplasm of cerebral meninges: Secondary | ICD-10-CM

## 2021-09-04 DIAGNOSIS — E0500 Thyrotoxicosis with diffuse goiter without thyrotoxic crisis or storm: Secondary | ICD-10-CM | POA: Diagnosis not present

## 2021-09-04 DIAGNOSIS — E669 Obesity, unspecified: Secondary | ICD-10-CM

## 2021-09-04 DIAGNOSIS — E1159 Type 2 diabetes mellitus with other circulatory complications: Secondary | ICD-10-CM | POA: Diagnosis not present

## 2021-09-04 DIAGNOSIS — I152 Hypertension secondary to endocrine disorders: Secondary | ICD-10-CM | POA: Diagnosis not present

## 2021-09-04 LAB — COMPREHENSIVE METABOLIC PANEL
ALT: 70 U/L — ABNORMAL HIGH (ref 0–35)
AST: 65 U/L — ABNORMAL HIGH (ref 0–37)
Albumin: 3.8 g/dL (ref 3.5–5.2)
Alkaline Phosphatase: 479 U/L — ABNORMAL HIGH (ref 39–117)
BUN: 20 mg/dL (ref 6–23)
CO2: 25 mEq/L (ref 19–32)
Calcium: 9.9 mg/dL (ref 8.4–10.5)
Chloride: 101 mEq/L (ref 96–112)
Creatinine, Ser: 0.78 mg/dL (ref 0.40–1.20)
GFR: 68.39 mL/min (ref >60.00)
Glucose, Bld: 103 mg/dL — ABNORMAL HIGH (ref 70–99)
Potassium: 4.1 mEq/L (ref 3.5–5.1)
Sodium: 136 mEq/L (ref 135–145)
Total Bilirubin: 1 mg/dL (ref 0.2–1.2)
Total Protein: 7.5 g/dL (ref 6.0–8.3)

## 2021-09-04 LAB — CBC WITH DIFFERENTIAL/PLATELET
Basophils Absolute: 0.1 10*3/uL (ref 0.0–0.1)
Basophils Relative: 1 % (ref 0.0–3.0)
Eosinophils Absolute: 0.2 10*3/uL (ref 0.0–0.7)
Eosinophils Relative: 2.9 % (ref 0.0–5.0)
HCT: 43.5 % (ref 36.0–46.0)
Hemoglobin: 14.7 g/dL (ref 12.0–15.0)
Lymphocytes Relative: 34.6 % (ref 12.0–46.0)
Lymphs Abs: 2.7 10*3/uL (ref 0.7–4.0)
MCHC: 33.7 g/dL (ref 30.0–36.0)
MCV: 89.6 fl (ref 78.0–100.0)
Monocytes Absolute: 0.7 10*3/uL (ref 0.1–1.0)
Monocytes Relative: 9.4 % (ref 3.0–12.0)
Neutro Abs: 4.1 10*3/uL (ref 1.4–7.7)
Neutrophils Relative %: 52.1 % (ref 43.0–77.0)
Platelets: 227 10*3/uL (ref 150.0–400.0)
RBC: 4.86 Mil/uL (ref 3.87–5.11)
RDW: 14 % (ref 11.5–15.5)
WBC: 7.9 10*3/uL (ref 4.0–10.5)

## 2021-09-04 LAB — HEMOGLOBIN A1C: Hgb A1c MFr Bld: 6.1 % (ref 4.6–6.5)

## 2021-09-04 LAB — TSH: TSH: 5.53 u[IU]/mL — ABNORMAL HIGH (ref 0.35–5.50)

## 2021-09-04 MED ORDER — VITAMIN C 1000 MG PO TABS: 1000.0000 mg | ORAL_TABLET | Freq: Every day | ORAL | 3 refills | Status: AC

## 2021-09-04 MED ORDER — OMEGA-3 FATTY ACIDS 1000 MG PO CAPS: 2.0000 g | ORAL_CAPSULE | Freq: Every day | ORAL | 11 refills | Status: AC

## 2021-09-04 MED ORDER — FUROSEMIDE 20 MG PO TABS: 20.0000 mg | ORAL_TABLET | Freq: Every day | ORAL | 1 refills | Status: DC

## 2021-09-04 MED ORDER — ZINC SULFATE 220 (50 ZN) MG PO CAPS: 220.0000 mg | ORAL_CAPSULE | Freq: Every day | ORAL | 11 refills | Status: AC

## 2021-09-04 NOTE — Assessment & Plan Note (Signed)
Well controlled on current regimen of atenolol losartan and furosemide . Renal function stable, no changes today. ?

## 2021-09-04 NOTE — Assessment & Plan Note (Signed)
Resulting in a fall [place].  She has completed rehab and is now using a walker. Advised to get Life Alert  ?

## 2021-09-04 NOTE — Telephone Encounter (Signed)
Last refilled in 2020 ?Last OV: 09/04/2021 ?Next OV: 03/07/2022 ?

## 2021-09-04 NOTE — Progress Notes (Signed)
Subjective:  Patient ID: Lauren Morris, female    DOB: 08-Sep-1931  Age: 88 y.o. MRN: [id number]  CC: The primary encounter diagnosis was Counseling regarding end of life decision making. Diagnoses of Personal history of PE (pulmonary embolism), Obesity, diabetes, and hypertension syndrome ([place]), Meningioma of cerebellum ([place]), Thyrotoxicosis due to Graves' disease, Weakness, Vertigo, History of thyrotoxicosis, Essential hypertension, Fatty liver disease, nonalcoholic, and Generalized weakness were also pertinent to this visit.   This visit occurred during the SARS-CoV-2 public health emergency.  Safety protocols were in place, including screening questions prior to the visit, additional usage of staff PPE, and extensive cleaning of exam room while observing appropriate contact time as indicated for disinfecting solutions.    HPI DELA SWEENY presents for follow up on recent hospitalization and rehab stay  Chief Complaint  Patient presents with   Follow-up    Follow up from a fall about 1 month ago   1) History of fall,  occurred  while leaving church with a group,  fell backward while walking down some steps and reportedly  LOC.   Hit head on cement.  No scalp laceration,  but developed a hematoma , unable to walk due to right sided thoracic pain involving hip side and torso .  Was Taken to [facility] , was admitted for workup of vertigo and weakness. Stayed at [facility] from Feb 8 to Feb 14  extensive workup done due to persistent vertigo. And right sided thoracic pain.    A  1.6 cm  Meningioma was an incidental finding in thre right cerebellum, no mass effect.  Discharged to SNF at [facility]   for PT and rehab,  discharged home  on March 8 .  Had Hallpike or Epley 2 maneuvers during hospitalization for correction of inner ear.  First one was not tolerated due to worsening vertigo symptoms.  Medicine was started (meclizine)  and 2nd maneuver helped.   Had episodes of brief vertigo  during SNF with rolling  over in bed but resolved rapidly.  Reviewed all  images obtained during hospitalization included CT  head,  cervical,  thoracic and lumbar spines.  CT abd and pelvis. MRI brain.  MRA brain all   Has been receiving  home  PT at home since discharge from [facility]. Using a walker   Vertigo has resolved,  has not seen neurologist or been referred .  her thoracic pain has been relieved   Dr Silvio Pate stopped her  vitamin C  zinc and fish oil  . These were reviewed and resume dtoday    Outpatient Medications Prior to Visit  Medication Sig Dispense Refill   atenolol (TENORMIN) 25 MG tablet TAKE 1 TABLET BY MOUTH TWICE DAILY 180 tablet 1   Calcium Carb-Cholecalciferol (CALCIUM 1000 + D PO) Take 1 tablet by mouth daily.     dorzolamide-timolol (COSOPT) 22.3-6.8 MG/ML ophthalmic solution Place 1 drop into both eyes 2 (two) times daily.     famotidine (PEPCID) 20 MG tablet TAKE 1 TABLET BY MOUTH 2 TIMES DAILY 180 tablet 1   losartan (COZAAR) 50 MG tablet Take 50 mg by mouth daily.     meclizine (ANTIVERT) 25 MG tablet Take 1 tablet (25 mg total) by mouth 2 (two) times daily. 60 tablet 0   Multiple Vitamin (MULTIVITAMIN WITH MINERALS) TABS tablet Take 1 tablet by mouth daily. 30 tablet 0   umeclidinium bromide (INCRUSE ELLIPTA) 62.5 MCG/ACT AEPB Inhale 1 puff into the lungs daily.  furosemide (LASIX) 20 MG tablet TAKE ONE TABLET BY MOUTH DAILY AS NEEDEDFOR FLUID RETENTION (Patient taking differently: Take 20 mg by mouth daily. TAKE ONE TABLET BY MOUTH DAILY AS NEEDED FOR FLUID RETENTION) 30 tablet 0   Ascorbic Acid (VITAMIN C) 1000 MG tablet Take 1,000 mg by mouth daily. (Patient not taking: Reported on 09/04/2021)     fish oil-omega-3 fatty acids 1000 MG capsule Take 2 g by mouth daily. (Patient not taking: Reported on 09/04/2021)     SPIRIVA HANDIHALER 18 MCG inhalation capsule INHALE CONTENTS OF 1 CAPSULE AS DIRECTEDONCE A DAY VIA HANDIHALER (Patient not taking: Reported on 09/04/2021) 30 capsule 12    telmisartan (MICARDIS) 40 MG tablet TAKE 1 TABLET BY MOUTH AT BEDTIME (Patient not taking: Reported on 09/04/2021) 90 tablet 1   zinc sulfate 220 (50 Zn) MG capsule Take 1 capsule (220 mg total) by mouth daily. (Patient not taking: Reported on 09/04/2021) 30 capsule 0   No facility-administered medications prior to visit.    Review of Systems;  Patient denies headache, fevers, malaise, unintentional weight loss, skin rash, eye pain, sinus congestion and sinus pain, sore throat, dysphagia,  hemoptysis , cough, dyspnea, wheezing, chest pain, palpitations, orthopnea, edema, abdominal pain, nausea, melena, diarrhea, constipation, flank pain, dysuria, hematuria, urinary  Frequency, nocturia, numbness, tingling, seizures,  Focal weakness, Loss of consciousness,  Tremor, insomnia, depression, anxiety, and suicidal ideation.      Objective:  BP 140/70 (BP Location: Left Arm, Patient Position: Sitting, Cuff Size: Normal)    Pulse (!) 53    Temp 97.8 F (36.6 C) (Oral)    Ht '5\' 2"'$  (1.575 m)    Wt 160 lb (72.6 kg)    SpO2 97%    BMI 29.26 kg/m   BP Readings from Last 3 Encounters:  09/04/21 140/70  08/08/21 (!) 153/63  07/01/21 (!) 152/77    Wt Readings from Last 3 Encounters:  09/04/21 160 lb (72.6 kg)  08/02/21 159 lb 6.3 oz (72.3 kg)  03/17/21 159 lb 6.4 oz (72.3 kg)    General appearance: alert, cooperative and appears stated age Ears: normal TM's and external ear canals both ears Throat: lips, mucosa, and tongue normal; teeth and gums normal Neck: no adenopathy, no carotid bruit, supple, symmetrical, trachea midline and thyroid not enlarged, symmetric, no tenderness/mass/nodules Back: symmetric, no curvature. ROM normal. No CVA tenderness. Lungs: clear to auscultation bilaterally Heart: regular rate and rhythm, S1, S2 normal, no murmur, click, rub or gallop Abdomen: soft, non-tender; bowel sounds normal; no masses,  no organomegaly Pulses: 2+ and symmetric Skin: Skin color, texture,  turgor normal. No rashes or lesions Lymph nodes: Cervical, supraclavicular, and axillary nodes normal.  Lab Results  Component Value Date   HGBA1C 6.1 09/04/2021   HGBA1C 6.1 05/25/2021   HGBA1C 6.8 (H) 12/12/2020    Lab Results  Component Value Date   CREATININE 0.78 09/04/2021   CREATININE 0.64 08/08/2021   CREATININE 0.75 08/07/2021    Lab Results  Component Value Date   WBC 7.9 09/04/2021   HGB 14.7 09/04/2021   HCT 43.5 09/04/2021   PLT 227.0 09/04/2021   GLUCOSE 103 (H) 09/04/2021   CHOL 156 01/14/2020   TRIG 101.0 01/14/2020   HDL 51.30 01/14/2020   LDLDIRECT 80.0 04/08/2017   LDLCALC 85 01/14/2020   ALT 70 (H) 09/04/2021   AST 65 (H) 09/04/2021   NA 136 09/04/2021   K 4.1 09/04/2021   CL 101 09/04/2021   CREATININE 0.78 09/04/2021  BUN 20 09/04/2021   CO2 25 09/04/2021   TSH 5.53 (H) 09/04/2021   HGBA1C 6.1 09/04/2021   MICROALBUR 0.9 05/25/2021    ECHOCARDIOGRAM COMPLETE  Result Date: 08/04/2021    ECHOCARDIOGRAM REPORT   Patient Name:   ARNESHIA ADE Date of Exam: 08/04/2021 Medical Rec #:  [id number]   Height:       62.0 in Accession #:    [id number]  Weight:       159.4 lb Date of Birth:  [birth year]  BSA:          1.736 m Patient Age:    71 years    BP:           137/65 mmHg Patient Gender: F           HR:           60 bpm. Exam Location:  [facility] Procedure: 2D Echo, Color Doppler, Cardiac Doppler and Intracardiac            Opacification Agent Indications:     R06.00 Dyspnea  History:         Patient has prior history of Echocardiogram examinations. Risk                  Factors:Hypertension and Sleep Apnea.  Sonographer:     Charmayne Sheer Referring Phys:  [id number] ASHISH ARORA Diagnosing Phys: Ida Rogue MD  Sonographer Comments: Suboptimal apical window. IMPRESSIONS  1. Left ventricular ejection fraction, by estimation, is 60 to 65%. The left ventricle has normal function. The left ventricle has no regional wall motion abnormalities. Left ventricular diastolic  parameters are consistent with Grade I diastolic dysfunction (impaired relaxation).  2. Right ventricular systolic function is normal. The right ventricular size is normal. Tricuspid regurgitation signal is inadequate for assessing PA pressure.  3. The mitral valve is normal in structure. No evidence of mitral valve regurgitation. No evidence of mitral stenosis.  4. The aortic valve is normal in structure. Aortic valve regurgitation is not visualized. Aortic valve sclerosis/calcification is present, without any evidence of aortic stenosis.  5. The inferior vena cava is normal in size with greater than 50% respiratory variability, suggesting right atrial pressure of 3 mmHg. FINDINGS  Left Ventricle: Left ventricular ejection fraction, by estimation, is 60 to 65%. The left ventricle has normal function. The left ventricle has no regional wall motion abnormalities. Definity contrast agent was given IV to delineate the left ventricular  endocardial borders. The left ventricular internal cavity size was normal in size. There is no left ventricular hypertrophy. Left ventricular diastolic parameters are consistent with Grade I diastolic dysfunction (impaired relaxation). Right Ventricle: The right ventricular size is normal. No increase in right ventricular wall thickness. Right ventricular systolic function is normal. Tricuspid regurgitation signal is inadequate for assessing PA pressure. Left Atrium: Left atrial size was normal in size. Right Atrium: Right atrial size was normal in size. Pericardium: There is no evidence of pericardial effusion. Mitral Valve: The mitral valve is normal in structure. Mild mitral annular calcification. No evidence of mitral valve regurgitation. No evidence of mitral valve stenosis. MV peak gradient, 4.1 mmHg. The mean mitral valve gradient is 2.0 mmHg. Tricuspid Valve: The tricuspid valve is normal in structure. Tricuspid valve regurgitation is not demonstrated. No evidence of tricuspid  stenosis. Aortic Valve: The aortic valve is normal in structure. Aortic valve regurgitation is not visualized. Aortic valve sclerosis/calcification is present, without any evidence of aortic stenosis. Aortic valve mean gradient measures 5.0  mmHg. Aortic valve peak  gradient measures 10.4 mmHg. Aortic valve area, by VTI measures 2.30 cm. Pulmonic Valve: The pulmonic valve was normal in structure. Pulmonic valve regurgitation is not visualized. No evidence of pulmonic stenosis. Aorta: The aortic root is normal in size and structure. Venous: The inferior vena cava is normal in size with greater than 50% respiratory variability, suggesting right atrial pressure of 3 mmHg. IAS/Shunts: No atrial level shunt detected by color flow Doppler.  LEFT VENTRICLE PLAX 2D LVIDd:         4.26 cm   Diastology LVIDs:         2.41 cm   LV e' medial:    5.44 cm/s LV PW:         0.96 cm   LV E/e' medial:  17.3 LV IVS:        0.92 cm   LV e' lateral:   5.00 cm/s LVOT diam:     1.90 cm   LV E/e' lateral: 18.8 LV SV:         79 LV SV Index:   45 LVOT Area:     2.84 cm  RIGHT VENTRICLE RV Basal diam:  3.67 cm LEFT ATRIUM             Index        RIGHT ATRIUM           Index LA diam:        2.80 cm 1.61 cm/m   RA Area:     13.90 cm LA Vol (A2C):   46.7 ml 26.90 ml/m  RA Volume:   28.00 ml  16.13 ml/m LA Vol (A4C):   40.6 ml 23.39 ml/m LA Biplane Vol: 43.4 ml 25.00 ml/m  AORTIC VALVE                     PULMONIC VALVE AV Area (Vmax):    2.01 cm      PV Vmax:       0.70 m/s AV Area (Vmean):   2.04 cm      PV Vmean:      52.000 cm/s AV Area (VTI):     2.30 cm      PV VTI:        0.139 m AV Vmax:           161.00 cm/s   PV Peak grad:  1.9 mmHg AV Vmean:          106.000 cm/s  PV Mean grad:  1.0 mmHg AV VTI:            0.343 m AV Peak Grad:      10.4 mmHg AV Mean Grad:      5.0 mmHg LVOT Vmax:         114.00 cm/s LVOT Vmean:        76.400 cm/s LVOT VTI:          0.278 m LVOT/AV VTI ratio: 0.81  AORTA Ao Root diam: 2.60 cm MITRAL VALVE  MV Area (PHT): 2.80 cm    SHUNTS MV Area VTI:   2.31 cm    Systemic VTI:  0.28 m MV Peak grad:  4.1 mmHg    Systemic Diam: 1.90 cm MV Mean grad:  2.0 mmHg MV Vmax:       1.01 m/s MV Vmean:      61.4 cm/s MV Decel Time: 271 msec MV E velocity: 94.10 cm/s MV A velocity: 92.40 cm/s MV E/A ratio:  1.02 Ida Rogue MD Electronically signed by Ida Rogue MD Signature Date/Time: 08/04/2021/12:38:38 PM    Final     Assessment & Plan:   Problem List Items Addressed This Visit     Essential hypertension    Well controlled on current regimen of atenolol losartan and furosemide . Renal function stable, no changes today.      Relevant Medications   losartan (COZAAR) 50 MG tablet   furosemide (LASIX) 20 MG tablet   Fatty liver disease, nonalcoholic    Liver enzymes remai elevated but synthetic function remains intact  Lab Results  Component Value Date   ALT 70 (H) 09/04/2021   AST 65 (H) 09/04/2021   GGT 596 (H) 11/25/2020   ALKPHOS 479 (H) 09/04/2021   BILITOT 1.0 09/04/2021         Generalized weakness    Resulting in a fall on church steps.  She has completed rehab and is now using a walker. Advised to get Life Alert       History of thyrotoxicosis    Has not taken  Methimazole  For at least a year due to liver enzyme elevation which was attributed to concurrent use of methimazole,  Current  TSH is elevated., not suppressed  n Lab Results  Component Value Date   TSH 5.53 (H) 09/04/2021          Obesity, diabetes, and hypertension syndrome ([facility])   Relevant Medications   losartan (COZAAR) 50 MG tablet   furosemide (LASIX) 20 MG tablet   Other Relevant Orders   Comprehensive metabolic panel (Completed)   Hemoglobin A1c (Completed)   Personal history of PE (pulmonary embolism)    Occurred in June 2022, Multiple emboli Suggested by V/Q scan.  Provoked by COVID 19 infection. Managed with Ell[facility] for 6 months.  CTA  I Dece 2022 showed complete resolution        Weakness    Relevant Orders   CBC with Differential/Platelet (Completed)   Vertigo    Resolved curretnly, although unclear of what etiology and unclear which treatment helped.  She has a 1.6 cm occipital meningioma on the right..      Other Visit Diagnoses     Counseling regarding end of life decision making    -  Primary   Relevant Orders   DNR (Do Not Resuscitate)   Meningioma of cerebellum [facility])       Relevant Orders   Ambulatory referral to Neurology       I spent 40 minutes dedicated to the care of this patient on the date of this encounter to include pre-visit review of patient's medical history,  most recent imaging studies, Face-to-face time with the patient , and post visit ordering of testing and therapeutics.    Follow-up: Return in about 6 months (around 03/07/2022).   [name], MD

## 2021-09-04 NOTE — Assessment & Plan Note (Signed)
Occurred in June 2022, Multiple emboli Suggested by V/Q scan.  Provoked by COVID 19 infection. Managed with [facility] for 6 months.  CTA  I Dece 2022 showed complete resolution   ?

## 2021-09-04 NOTE — Patient Instructions (Signed)
You can resume taking your daily doses of Vitamin C,  zinc and Fish oil ? ? ?Please reschedule your appt with Dr Cruzita Lederer ? ? ? ?

## 2021-09-04 NOTE — Assessment & Plan Note (Signed)
Liver enzymes remai elevated but synthetic function remains intact ? ?Lab Results  ?Component Value Date  ? ALT 70 (H) 09/04/2021  ? AST 65 (H) 09/04/2021  ? GGT 596 (H) 11/25/2020  ? ALKPHOS 479 (H) 09/04/2021  ? BILITOT 1.0 09/04/2021  ? ? ?

## 2021-09-04 NOTE — Assessment & Plan Note (Addendum)
Has not taken  Methimazole  For at least a year due to liver enzyme elevation which was attributed to concurrent use of methimazole,  Current  TSH is elevated., not suppressed  ?n ?Lab Results  ?Component Value Date  ? TSH 5.53 (H) 09/04/2021  ? ? ? ?

## 2021-09-04 NOTE — Assessment & Plan Note (Signed)
Resolved curretnly, although unclear of what etiology and unclear which treatment helped.  She has a 1.6 cm occipital meningioma on the right.Lauren Morris ?

## 2021-09-04 NOTE — Telephone Encounter (Signed)
Pt called in requesting for refill on medication (furosemide (LASIX) 20 MG tablet). Pt stated that pharmacy advise them she needs a new script because its been so long since she had this medication. Pt requesting callback.  ?

## 2021-09-06 DIAGNOSIS — Z9181 History of falling: Secondary | ICD-10-CM | POA: Diagnosis not present

## 2021-09-06 DIAGNOSIS — H409 Unspecified glaucoma: Secondary | ICD-10-CM | POA: Diagnosis not present

## 2021-09-06 DIAGNOSIS — J40 Bronchitis, not specified as acute or chronic: Secondary | ICD-10-CM | POA: Diagnosis not present

## 2021-09-06 DIAGNOSIS — S060XAD Concussion with loss of consciousness status unknown, subsequent encounter: Secondary | ICD-10-CM | POA: Diagnosis not present

## 2021-09-06 DIAGNOSIS — M549 Dorsalgia, unspecified: Secondary | ICD-10-CM | POA: Diagnosis not present

## 2021-09-06 DIAGNOSIS — I10 Essential (primary) hypertension: Secondary | ICD-10-CM | POA: Diagnosis not present

## 2021-09-12 DIAGNOSIS — H409 Unspecified glaucoma: Secondary | ICD-10-CM | POA: Diagnosis not present

## 2021-09-12 DIAGNOSIS — Z9181 History of falling: Secondary | ICD-10-CM | POA: Diagnosis not present

## 2021-09-12 DIAGNOSIS — J40 Bronchitis, not specified as acute or chronic: Secondary | ICD-10-CM | POA: Diagnosis not present

## 2021-09-12 DIAGNOSIS — S060XAD Concussion with loss of consciousness status unknown, subsequent encounter: Secondary | ICD-10-CM | POA: Diagnosis not present

## 2021-09-12 DIAGNOSIS — I10 Essential (primary) hypertension: Secondary | ICD-10-CM | POA: Diagnosis not present

## 2021-09-12 DIAGNOSIS — M549 Dorsalgia, unspecified: Secondary | ICD-10-CM | POA: Diagnosis not present

## 2021-09-13 DIAGNOSIS — H409 Unspecified glaucoma: Secondary | ICD-10-CM | POA: Diagnosis not present

## 2021-09-13 DIAGNOSIS — M549 Dorsalgia, unspecified: Secondary | ICD-10-CM | POA: Diagnosis not present

## 2021-09-13 DIAGNOSIS — Z9181 History of falling: Secondary | ICD-10-CM | POA: Diagnosis not present

## 2021-09-13 DIAGNOSIS — S060XAD Concussion with loss of consciousness status unknown, subsequent encounter: Secondary | ICD-10-CM | POA: Diagnosis not present

## 2021-09-13 DIAGNOSIS — J40 Bronchitis, not specified as acute or chronic: Secondary | ICD-10-CM | POA: Diagnosis not present

## 2021-09-13 DIAGNOSIS — I10 Essential (primary) hypertension: Secondary | ICD-10-CM | POA: Diagnosis not present

## 2021-09-18 ENCOUNTER — Ambulatory Visit: Payer: Medicare Other | Admitting: Internal Medicine

## 2021-09-20 ENCOUNTER — Ambulatory Visit: Payer: Medicare Other | Admitting: Internal Medicine

## 2021-09-22 DIAGNOSIS — S060XAD Concussion with loss of consciousness status unknown, subsequent encounter: Secondary | ICD-10-CM | POA: Diagnosis not present

## 2021-09-22 DIAGNOSIS — I10 Essential (primary) hypertension: Secondary | ICD-10-CM | POA: Diagnosis not present

## 2021-09-22 DIAGNOSIS — J40 Bronchitis, not specified as acute or chronic: Secondary | ICD-10-CM | POA: Diagnosis not present

## 2021-09-22 DIAGNOSIS — Z9181 History of falling: Secondary | ICD-10-CM | POA: Diagnosis not present

## 2021-09-22 DIAGNOSIS — M549 Dorsalgia, unspecified: Secondary | ICD-10-CM | POA: Diagnosis not present

## 2021-09-22 DIAGNOSIS — H409 Unspecified glaucoma: Secondary | ICD-10-CM | POA: Diagnosis not present

## 2021-09-26 DIAGNOSIS — M549 Dorsalgia, unspecified: Secondary | ICD-10-CM | POA: Diagnosis not present

## 2021-09-26 DIAGNOSIS — I10 Essential (primary) hypertension: Secondary | ICD-10-CM | POA: Diagnosis not present

## 2021-09-26 DIAGNOSIS — S060XAD Concussion with loss of consciousness status unknown, subsequent encounter: Secondary | ICD-10-CM | POA: Diagnosis not present

## 2021-09-26 DIAGNOSIS — H409 Unspecified glaucoma: Secondary | ICD-10-CM | POA: Diagnosis not present

## 2021-09-26 DIAGNOSIS — Z9181 History of falling: Secondary | ICD-10-CM | POA: Diagnosis not present

## 2021-09-26 DIAGNOSIS — J40 Bronchitis, not specified as acute or chronic: Secondary | ICD-10-CM | POA: Diagnosis not present

## 2021-09-27 DIAGNOSIS — R059 Cough, unspecified: Secondary | ICD-10-CM | POA: Diagnosis not present

## 2021-09-27 DIAGNOSIS — R051 Acute cough: Secondary | ICD-10-CM | POA: Diagnosis not present

## 2021-09-27 DIAGNOSIS — Z20822 Contact with and (suspected) exposure to covid-19: Secondary | ICD-10-CM | POA: Diagnosis not present

## 2021-09-30 DIAGNOSIS — J40 Bronchitis, not specified as acute or chronic: Secondary | ICD-10-CM | POA: Diagnosis not present

## 2021-09-30 DIAGNOSIS — I10 Essential (primary) hypertension: Secondary | ICD-10-CM | POA: Diagnosis not present

## 2021-09-30 DIAGNOSIS — M549 Dorsalgia, unspecified: Secondary | ICD-10-CM | POA: Diagnosis not present

## 2021-09-30 DIAGNOSIS — H409 Unspecified glaucoma: Secondary | ICD-10-CM | POA: Diagnosis not present

## 2021-09-30 DIAGNOSIS — Z9181 History of falling: Secondary | ICD-10-CM | POA: Diagnosis not present

## 2021-09-30 DIAGNOSIS — S060XAD Concussion with loss of consciousness status unknown, subsequent encounter: Secondary | ICD-10-CM | POA: Diagnosis not present

## 2021-10-09 DIAGNOSIS — Z20822 Contact with and (suspected) exposure to covid-19: Secondary | ICD-10-CM | POA: Diagnosis not present

## 2021-10-11 DIAGNOSIS — M549 Dorsalgia, unspecified: Secondary | ICD-10-CM | POA: Diagnosis not present

## 2021-10-11 DIAGNOSIS — J40 Bronchitis, not specified as acute or chronic: Secondary | ICD-10-CM | POA: Diagnosis not present

## 2021-10-11 DIAGNOSIS — H409 Unspecified glaucoma: Secondary | ICD-10-CM | POA: Diagnosis not present

## 2021-10-11 DIAGNOSIS — I10 Essential (primary) hypertension: Secondary | ICD-10-CM | POA: Diagnosis not present

## 2021-10-11 DIAGNOSIS — Z9181 History of falling: Secondary | ICD-10-CM | POA: Diagnosis not present

## 2021-10-11 DIAGNOSIS — S060XAD Concussion with loss of consciousness status unknown, subsequent encounter: Secondary | ICD-10-CM | POA: Diagnosis not present

## 2021-10-25 DIAGNOSIS — J40 Bronchitis, not specified as acute or chronic: Secondary | ICD-10-CM | POA: Diagnosis not present

## 2021-10-25 DIAGNOSIS — H409 Unspecified glaucoma: Secondary | ICD-10-CM | POA: Diagnosis not present

## 2021-10-25 DIAGNOSIS — M549 Dorsalgia, unspecified: Secondary | ICD-10-CM | POA: Diagnosis not present

## 2021-10-25 DIAGNOSIS — Z9181 History of falling: Secondary | ICD-10-CM | POA: Diagnosis not present

## 2021-10-25 DIAGNOSIS — I10 Essential (primary) hypertension: Secondary | ICD-10-CM | POA: Diagnosis not present

## 2021-10-25 DIAGNOSIS — S060XAD Concussion with loss of consciousness status unknown, subsequent encounter: Secondary | ICD-10-CM | POA: Diagnosis not present

## 2021-10-30 DIAGNOSIS — Z20822 Contact with and (suspected) exposure to covid-19: Secondary | ICD-10-CM | POA: Diagnosis not present

## 2021-11-02 DIAGNOSIS — H04123 Dry eye syndrome of bilateral lacrimal glands: Secondary | ICD-10-CM | POA: Diagnosis not present

## 2021-11-02 DIAGNOSIS — H524 Presbyopia: Secondary | ICD-10-CM | POA: Diagnosis not present

## 2021-11-02 DIAGNOSIS — H35373 Puckering of macula, bilateral: Secondary | ICD-10-CM | POA: Diagnosis not present

## 2021-11-02 DIAGNOSIS — H401131 Primary open-angle glaucoma, bilateral, mild stage: Secondary | ICD-10-CM | POA: Diagnosis not present

## 2021-11-12 ENCOUNTER — Emergency Department: Payer: Medicare Other

## 2021-11-12 ENCOUNTER — Other Ambulatory Visit: Payer: Self-pay

## 2021-11-12 ENCOUNTER — Observation Stay
Admission: EM | Admit: 2021-11-12 | Discharge: 2021-11-13 | Disposition: A | Discharge status: Home Health | Payer: Medicare Other | Attending: Internal Medicine | Admitting: Internal Medicine

## 2021-11-12 ENCOUNTER — Encounter: Payer: Self-pay | Admitting: Family Medicine

## 2021-11-12 DIAGNOSIS — R2681 Unsteadiness on feet: Secondary | ICD-10-CM | POA: Diagnosis not present

## 2021-11-12 DIAGNOSIS — G4733 Obstructive sleep apnea (adult) (pediatric): Secondary | ICD-10-CM | POA: Diagnosis not present

## 2021-11-12 DIAGNOSIS — E119 Type 2 diabetes mellitus without complications: Secondary | ICD-10-CM | POA: Diagnosis not present

## 2021-11-12 DIAGNOSIS — Z20822 Contact with and (suspected) exposure to covid-19: Secondary | ICD-10-CM | POA: Diagnosis not present

## 2021-11-12 DIAGNOSIS — R0902 Hypoxemia: Secondary | ICD-10-CM | POA: Diagnosis not present

## 2021-11-12 DIAGNOSIS — N3000 Acute cystitis without hematuria: Secondary | ICD-10-CM | POA: Diagnosis not present

## 2021-11-12 DIAGNOSIS — Z79899 Other long term (current) drug therapy: Secondary | ICD-10-CM | POA: Insufficient documentation

## 2021-11-12 DIAGNOSIS — R29818 Other symptoms and signs involving the nervous system: Secondary | ICD-10-CM | POA: Diagnosis not present

## 2021-11-12 DIAGNOSIS — R531 Weakness: Secondary | ICD-10-CM | POA: Diagnosis not present

## 2021-11-12 DIAGNOSIS — E860 Dehydration: Secondary | ICD-10-CM

## 2021-11-12 DIAGNOSIS — N390 Urinary tract infection, site not specified: Secondary | ICD-10-CM

## 2021-11-12 DIAGNOSIS — M6281 Muscle weakness (generalized): Principal | ICD-10-CM | POA: Insufficient documentation

## 2021-11-12 DIAGNOSIS — E039 Hypothyroidism, unspecified: Secondary | ICD-10-CM | POA: Diagnosis not present

## 2021-11-12 DIAGNOSIS — Z66 Do not resuscitate: Secondary | ICD-10-CM | POA: Diagnosis not present

## 2021-11-12 DIAGNOSIS — D329 Benign neoplasm of meninges, unspecified: Secondary | ICD-10-CM | POA: Diagnosis present

## 2021-11-12 DIAGNOSIS — R0689 Other abnormalities of breathing: Secondary | ICD-10-CM | POA: Diagnosis not present

## 2021-11-12 DIAGNOSIS — Z86711 Personal history of pulmonary embolism: Secondary | ICD-10-CM | POA: Diagnosis present

## 2021-11-12 DIAGNOSIS — I10 Essential (primary) hypertension: Secondary | ICD-10-CM | POA: Diagnosis present

## 2021-11-12 DIAGNOSIS — E785 Hyperlipidemia, unspecified: Secondary | ICD-10-CM | POA: Diagnosis present

## 2021-11-12 DIAGNOSIS — R059 Cough, unspecified: Secondary | ICD-10-CM | POA: Diagnosis not present

## 2021-11-12 DIAGNOSIS — E669 Obesity, unspecified: Secondary | ICD-10-CM | POA: Diagnosis present

## 2021-11-12 DIAGNOSIS — E1169 Type 2 diabetes mellitus with other specified complication: Secondary | ICD-10-CM | POA: Diagnosis present

## 2021-11-12 DIAGNOSIS — Z8616 Personal history of COVID-19: Secondary | ICD-10-CM | POA: Diagnosis not present

## 2021-11-12 HISTORY — DX: Urinary tract infection, site not specified: N39.0

## 2021-11-12 LAB — CBC WITH DIFFERENTIAL/PLATELET
Abs Immature Granulocytes: 0.06 10*3/uL (ref 0.00–0.07)
Basophils Absolute: 0 10*3/uL (ref 0.0–0.1)
Basophils Relative: 0 %
Eosinophils Absolute: 0.1 10*3/uL (ref 0.0–0.5)
Eosinophils Relative: 1 %
HCT: 46.1 % — ABNORMAL HIGH (ref 36.0–46.0)
Hemoglobin: 14.9 g/dL (ref 12.0–15.0)
Immature Granulocytes: 1 %
Lymphocytes Relative: 16 %
Lymphs Abs: 1.6 10*3/uL (ref 0.7–4.0)
MCH: 29.2 pg (ref 26.0–34.0)
MCHC: 32.3 g/dL (ref 30.0–36.0)
MCV: 90.4 fL (ref 80.0–100.0)
Monocytes Absolute: 0.9 10*3/uL (ref 0.1–1.0)
Monocytes Relative: 9 %
Neutro Abs: 7.4 10*3/uL (ref 1.7–7.7)
Neutrophils Relative %: 73 %
Platelets: 246 10*3/uL (ref 150–400)
RBC: 5.1 MIL/uL (ref 3.87–5.11)
RDW: 13.4 % (ref 11.5–15.5)
WBC: 10.2 10*3/uL (ref 4.0–10.5)
nRBC: 0 % (ref 0.0–0.2)

## 2021-11-12 LAB — CBC
HCT: 42.2 % (ref 36.0–46.0)
Hemoglobin: 13.4 g/dL (ref 12.0–15.0)
MCH: 28.9 pg (ref 26.0–34.0)
MCHC: 31.8 g/dL (ref 30.0–36.0)
MCV: 90.9 fL (ref 80.0–100.0)
Platelets: 220 10*3/uL (ref 150–400)
RBC: 4.64 MIL/uL (ref 3.87–5.11)
RDW: 13.5 % (ref 11.5–15.5)
WBC: 10.9 10*3/uL — ABNORMAL HIGH (ref 4.0–10.5)
nRBC: 0 % (ref 0.0–0.2)

## 2021-11-12 LAB — COMPREHENSIVE METABOLIC PANEL
ALT: 71 U/L — ABNORMAL HIGH (ref 0–44)
AST: 70 U/L — ABNORMAL HIGH (ref 15–41)
Albumin: 3.4 g/dL — ABNORMAL LOW (ref 3.5–5.0)
Alkaline Phosphatase: 586 U/L — ABNORMAL HIGH (ref 38–126)
Anion gap: 9 (ref 5–15)
BUN: 17 mg/dL (ref 8–23)
CO2: 25 mmol/L (ref 22–32)
Calcium: 9.2 mg/dL (ref 8.9–10.3)
Chloride: 102 mmol/L (ref 98–111)
Creatinine, Ser: 0.88 mg/dL (ref 0.44–1.00)
GFR, Estimated: >60 mL/min (ref >60)
Glucose, Bld: 133 mg/dL — ABNORMAL HIGH (ref 70–99)
Potassium: 3.8 mmol/L (ref 3.5–5.1)
Sodium: 136 mmol/L (ref 135–145)
Total Bilirubin: 1.7 mg/dL — ABNORMAL HIGH (ref 0.3–1.2)
Total Protein: 8 g/dL (ref 6.5–8.1)

## 2021-11-12 LAB — RESP PANEL BY RT-PCR (FLU A&B, COVID) ARPGX2
Influenza A by PCR: NEGATIVE
Influenza B by PCR: NEGATIVE
SARS Coronavirus 2 by RT PCR: NEGATIVE

## 2021-11-12 LAB — URINALYSIS, ROUTINE W REFLEX MICROSCOPIC
Bilirubin Urine: NEGATIVE
Glucose, UA: NEGATIVE mg/dL
Hgb urine dipstick: NEGATIVE
Ketones, ur: NEGATIVE mg/dL
Nitrite: POSITIVE — AB
Protein, ur: NEGATIVE mg/dL
Specific Gravity, Urine: 1.015 (ref 1.005–1.030)
pH: 6 (ref 5.0–8.0)

## 2021-11-12 LAB — LACTIC ACID, PLASMA: Lactic Acid, Venous: 1.4 mmol/L (ref 0.5–1.9)

## 2021-11-12 LAB — TROPONIN I (HIGH SENSITIVITY)
Troponin I (High Sensitivity): 14 ng/L (ref <18)
Troponin I (High Sensitivity): 15 ng/L (ref <18)

## 2021-11-12 LAB — CREATININE, SERUM
Creatinine, Ser: 0.91 mg/dL (ref 0.44–1.00)
GFR, Estimated: >60 mL/min (ref >60)

## 2021-11-12 MED ORDER — LOSARTAN POTASSIUM 50 MG PO TABS
50.0000 mg | ORAL_TABLET | Freq: Every day | ORAL | Status: DC
  Administered 2021-11-12 – 2021-11-13 (×2): 50 mg via ORAL
  Filled 2021-11-12 (×2): qty 1

## 2021-11-12 MED ORDER — SODIUM CHLORIDE 0.9 % IV SOLN
1.0000 g | Freq: Once | INTRAVENOUS | Status: AC
  Administered 2021-11-12: 1 g via INTRAVENOUS
  Filled 2021-11-12: qty 10

## 2021-11-12 MED ORDER — ENOXAPARIN SODIUM 30 MG/0.3ML IJ SOSY
30.0000 mg | PREFILLED_SYRINGE | INTRAMUSCULAR | Status: DC
  Administered 2021-11-12: 30 mg via SUBCUTANEOUS
  Filled 2021-11-12: qty 0.3

## 2021-11-12 MED ORDER — DOCUSATE SODIUM 100 MG PO CAPS
100.0000 mg | ORAL_CAPSULE | Freq: Two times a day (BID) | ORAL | Status: DC
  Administered 2021-11-12 – 2021-11-13 (×2): 100 mg via ORAL
  Filled 2021-11-12 (×2): qty 1

## 2021-11-12 MED ORDER — SODIUM CHLORIDE 0.9 % IV SOLN
1.0000 g | INTRAVENOUS | Status: DC
  Administered 2021-11-13: 1 g via INTRAVENOUS
  Filled 2021-11-12: qty 10

## 2021-11-12 MED ORDER — SODIUM CHLORIDE 0.9 % IV BOLUS
500.0000 mL | Freq: Once | INTRAVENOUS | Status: AC
  Administered 2021-11-12: 500 mL via INTRAVENOUS

## 2021-11-12 MED ORDER — ONDANSETRON HCL 4 MG PO TABS: 4.0000 mg | ORAL_TABLET | Freq: Four times a day (QID) | ORAL | Status: DC | PRN

## 2021-11-12 MED ORDER — DORZOLAMIDE HCL-TIMOLOL MAL 2-0.5 % OP SOLN
1.0000 [drp] | Freq: Once | OPHTHALMIC | Status: AC
  Administered 2021-11-12: 1 [drp] via OPHTHALMIC
  Filled 2021-11-12: qty 10

## 2021-11-12 MED ORDER — SENNA 8.6 MG PO TABS
1.0000 | ORAL_TABLET | Freq: Two times a day (BID) | ORAL | Status: DC
  Administered 2021-11-12 – 2021-11-13 (×2): 8.6 mg via ORAL
  Filled 2021-11-12 (×2): qty 1

## 2021-11-12 MED ORDER — ATENOLOL 25 MG PO TABS
25.0000 mg | ORAL_TABLET | Freq: Two times a day (BID) | ORAL | Status: DC
  Administered 2021-11-12 – 2021-11-13 (×2): 25 mg via ORAL
  Filled 2021-11-12 (×3): qty 1

## 2021-11-12 MED ORDER — UMECLIDINIUM BROMIDE 62.5 MCG/ACT IN AEPB
1.0000 | INHALATION_SPRAY | Freq: Every day | RESPIRATORY_TRACT | Status: DC
  Administered 2021-11-13: 1 via RESPIRATORY_TRACT
  Filled 2021-11-12 (×2): qty 7

## 2021-11-12 MED ORDER — SODIUM CHLORIDE 0.9 % IV SOLN: INTRAVENOUS | Status: DC

## 2021-11-12 MED ORDER — FAMOTIDINE 20 MG PO TABS
20.0000 mg | ORAL_TABLET | Freq: Two times a day (BID) | ORAL | Status: DC
  Administered 2021-11-12 – 2021-11-13 (×2): 20 mg via ORAL
  Filled 2021-11-12 (×2): qty 1

## 2021-11-12 MED ORDER — ONDANSETRON HCL 4 MG/2ML IJ SOLN: 4.0000 mg | Freq: Four times a day (QID) | INTRAMUSCULAR | Status: DC | PRN

## 2021-11-12 MED ORDER — ACETAMINOPHEN 325 MG PO TABS: 650.0000 mg | ORAL_TABLET | Freq: Four times a day (QID) | ORAL | Status: DC | PRN

## 2021-11-12 MED ORDER — ACETAMINOPHEN 650 MG RE SUPP
650.0000 mg | Freq: Four times a day (QID) | RECTAL | Status: DC | PRN
Start: 2021-11-12 — End: 2021-11-13

## 2021-11-12 NOTE — Assessment & Plan Note (Signed)
Obtain lipid profile.

## 2021-11-12 NOTE — Assessment & Plan Note (Addendum)
Obtain total thyroid profile.  Can consider levothyroxine if required.

## 2021-11-12 NOTE — ED Provider Notes (Signed)
[facility] Provider Note    Event Date/Time   First MD Initiated Contact with Patient 11/12/21 1044     (approximate)   History   Weakness   HPI  Lauren Morris is a 88 y.o. female with a history of hypertension, hypothyroidism, and PE (not on anticoagulation) who presents from her facility with generalized weakness since yesterday.  The patient reports a cough and states her mouth is dry but denies any other focal symptoms.  She normally is able to walk with assistance but is unable to get up today.  She denies fever, vomiting, chest or abdominal pain, or urinary symptoms.    Physical Exam   Triage Vital Signs: ED Triage Vitals  Enc Vitals Group     BP 11/12/21 1051 (!) 142/53     Pulse Rate 11/12/21 1051 86     Resp 11/12/21 1051 16     Temp 11/12/21 1051 98.6 F (37 C)     Temp Source 11/12/21 1051 Oral     SpO2 11/12/21 1051 99 %     Weight 11/12/21 1047 158 lb 11.7 oz (72 kg)     Height 11/12/21 1047 5' 2"  (1.575 m)     Head Circumference --      Peak Flow --      Pain Score 11/12/21 1047 0     Pain Loc --      Pain Edu? --      Excl. in [place]? --     Most recent vital signs: Vitals:   11/12/21 1237 11/12/21 1330  BP: 115/60 139/61  Pulse: 72 82  Resp: 20 (!) 26  Temp:    SpO2: 96% 98%     General: Alert and oriented, somewhat frail appearing but in no acute distress. CV:  Good peripheral perfusion.  Normal heart sounds. Resp:  Normal effort.  Lungs CTA B Abd:  Soft and nontender.  No distention.  Other:  No peripheral edema.  Dry mucous membranes.  EOMI.  PERRLA.  No facial droop.  No pronator drift.  Motor and sensory intact in all extremities.  No ataxia on finger-to-nose.   ED Results / Procedures / Treatments   Labs (all labs ordered are listed, but only abnormal results are displayed) Labs Reviewed  COMPREHENSIVE METABOLIC PANEL - Abnormal; Notable for the following components:      Result Value   Glucose, Bld 133 (*)     Albumin 3.4 (*)    AST 70 (*)    ALT 71 (*)    Alkaline Phosphatase 586 (*)    Total Bilirubin 1.7 (*)    All other components within normal limits  CBC WITH DIFFERENTIAL/PLATELET - Abnormal; Notable for the following components:   HCT 46.1 (*)    All other components within normal limits  URINALYSIS, ROUTINE W REFLEX MICROSCOPIC - Abnormal; Notable for the following components:   Color, Urine YELLOW (*)    APPearance HAZY (*)    Nitrite POSITIVE (*)    Leukocytes,Ua TRACE (*)    Bacteria, UA RARE (*)    All other components within normal limits  RESP PANEL BY RT-PCR (FLU A&B, COVID) ARPGX2  LACTIC ACID, PLASMA  TROPONIN I (HIGH SENSITIVITY)  TROPONIN I (HIGH SENSITIVITY)     EKG  ED ECG REPORT I, Arta Silence, the attending physician, personally viewed and interpreted this ECG.  Date: 11/12/2021 EKG Time: 1049 Rate: 79 Rhythm: normal sinus rhythm QRS Axis: normal Intervals: normal ST/T Wave  abnormalities: normal Narrative Interpretation: no evidence of acute ischemia    RADIOLOGY  Chest x-ray: I independently viewed and interpreted the images; there is no focal consolidation or edema  CT head: No ICH or evidence of acute stroke  PROCEDURES:  Critical Care performed: No  Procedures   MEDICATIONS ORDERED IN ED: Medications  cefTRIAXone (ROCEPHIN) 1 g in sodium chloride 0.9 % 100 mL IVPB (1 g Intravenous New Bag/Given 11/12/21 1415)  sodium chloride 0.9 % bolus 500 mL (0 mLs Intravenous Stopped 11/12/21 1155)  sodium chloride 0.9 % bolus 500 mL (0 mLs Intravenous Stopped 11/12/21 1352)  dorzolamide-timolol (COSOPT) 22.3-6.8 MG/ML ophthalmic solution 1 drop (1 drop Both Eyes Given 11/12/21 1352)     IMPRESSION / MDM / ASSESSMENT AND PLAN / ED COURSE  I reviewed the triage vital signs and the nursing notes.  88 year old female with PMH as noted above presents with generalized weakness and difficulty ambulating since yesterday associated with cough and  dry mouth.  I reviewed the past medical records.  Per the hospitalist discharge summary from 08/08/2021, the patient was admitted at that time for weakness and vertigo thought to be likely due to BPPV.  She had a negative neurology work-up.  On exam today, the patient is alert and oriented, slightly weak and frail appearing but in no acute distress.  Her vital signs are normal.  Physical exam is unremarkable for acute findings although she does have quite dry mucous membranes.  Neuro exam is nonfocal.  Differential diagnosis includes, but is not limited to, dehydration, electrolyte abnormality, AKI, other metabolic etiology, UTI, pneumonia, other acute infection, or less likely primary cardiac etiology.  We will obtain lab work-up, give a fluid bolus, and reassess  The patient is on the cardiac monitor to evaluate for evidence of arrhythmia and/or significant heart rate changes.  ----------------------------------------- 1:09 PM on 11/12/2021 -----------------------------------------  Work-up so far is reassuring.  Chest x-ray shows no acute findings.  There is no leukocytosis and the lactate is normal.  Respiratory panel is negative.  Troponin is normal.  Electrolytes are normal.  The patient has transaminitis, elevated alk phos and slightly elevated bilirubin; however, I have reviewed recent labs and this appears to be chronic.  The patient has had similar elevations over the last several months that were noted during her last hospitalization.  There is no evidence that this is an acute process or related to her acute weakness.  She has no abdominal pain, jaundice, or clinical evidence of biliary obstruction.  I discussed the patient further with her daughter who is now here.  She advised that the patient slumped to the left, lost all strength, and appeared to be weaker in the left leg.  She then was noted to become generally weak.  I repeated the neurologic exam and the patient still has no  unilateral deficits.  However, given this additional history, there is a possibility of TIA.  I have ordered a CT.  ----------------------------------------- 2:17 PM on 11/12/2021 -----------------------------------------  CT head is negative for acute findings.  Urinalysis reveals evidence of UTI which likely explains patient's generalized weakness.  I have ordered IV ceftriaxone.  Given her weakness and difficulty with ambulation she will need admission.  I consulted Dr. Dwyane Dee from the hospitalist service; based on her discussion he agrees to admit the patient   FINAL CLINICAL IMPRESSION(S) / ED DIAGNOSES   Final diagnoses:  Generalized weakness  Acute cystitis without hematuria  Dehydration     Rx / DC  Orders   ED Discharge Orders     None        Note:  This document was prepared using Dragon voice recognition software and may include unintentional dictation errors.    Arta Silence, MD 11/12/21 [phone]

## 2021-11-12 NOTE — Assessment & Plan Note (Signed)
Patient report history of pulmonary embolism but not on anticoagulation.

## 2021-11-12 NOTE — ED Triage Notes (Signed)
BIB EMS from home. Weakness started yesterday. Stroke screen for EMS is negative. Pt leaning to the left. Pt states she normally can walk with assistance. Pt unable to bear any weight today. EMS smelled foul smell when moving. Pt denies N/V/D  20 R Wrist EKG abnormal 154/87 RR 29 92% on RA placed on [place] at 2 liters.  Afebrile.

## 2021-11-12 NOTE — Hospital Course (Addendum)
This 88 years old female with PMH significant for hypertension, hypothyroidism, PE not on anticoagulation presented in the ED from assisted living facility with generalized weakness since yesterday.  Patient reports progressive worsening of weakness and yesterday she slumped over on the left side and was unable to stand up. She also reports cough,  dry mouth but denies any focal symptoms.  Patient usually uses a walker fibrillation but was unable to get up today.  Daughter at bedside denies any fever, chills, diarrhea, sick contacts, recent travel.  ED course: She is hemodynamically stable except hypertension. Temp 98.6, HR 73, RR 19, BP 121/58, SPO2 97% on room air. Labs include sodium 136, potassium 3.8, chloride 102, bicarb 25, glucose 133, BUN 17, creatinine 0.88, calcium 9.2, anion gap 9, alkaline phosphatase 586, albumin 3.4, AST 70, ALT 71, total protein 8.0, total bilirubin 1.7, troponin 14> 15, lactic acid 1.4, WBC 10.2, hemoglobin 14.9, hematocrit 46.1, platelet 246, influenza negative, COVID-negative, UA nitrites positive. CT head : No acute intracranial process. Stable small calcified meningioma in right posterior fossa.  Chest x-ray no active lung disease. EKG : Normal sinus rhythm, left atrial enlargement.  Patient was placed on observation for concern of UTI and generalized weakness.  Started on ceftriaxone.    5/22: Unfortunately urine cultures were not done in ED.  Ordered as add-on as she is already on antibiotics.  Patient does not have any specific urinary symptoms.  She had dry cough mostly at night for years, no recent change.  Right upper quadrant ultrasound was negative for any significant abnormality.  She received ceftriaxone and we decided to treat her concern of UTI as an elderly sometime the only presentation is generalized weakness.  She was given 5 days of Keflex for home. Over physical therapist also evaluated her and they are recommending home health services which were  ordered.  She will continue with her home medications and follow-up with her providers.

## 2021-11-12 NOTE — Assessment & Plan Note (Signed)
Continue ceftriaxone daily, follow-up urine cultures.

## 2021-11-12 NOTE — ED Notes (Signed)
Patient placed on purewick at this time. 

## 2021-11-12 NOTE — Assessment & Plan Note (Addendum)
Patient presented with generalized weakness, slumped over on the left side. CT head unremarkable, UA consistent with UTI. TIA less likely, no focal neurological deficits noted. Initiated empirically on ceftriaxone, continue IV hydration. Follow-up urine culture.

## 2021-11-12 NOTE — H&P (Signed)
History and Physical    Patient: Lauren Morris JQB:[id number] DOB: [birth year] DOA: 11/12/2021 DOS: the patient was seen and examined on 11/12/2021  PCP: [name], MD   Patient coming from: ALF/ILF  Chief Complaint:  Chief Complaint  Patient presents with   Weakness   HPI: Lauren Morris is a 88 y.o. female with PMH significant for hypertension, hypothyroidism, PE not on anticoagulation presented in the ED from assisted living facility with generalized weakness since yesterday.  Patient reports progressive worsening of weakness and yesterday she slumped over on the left side and was unable to stand up. She also reports cough,  dry mouth but denies any focal symptoms.  Patient usually uses a walker fibrillation but was unable to get up today.  Daughter at bedside denies any fever, chills, diarrhea, sick contacts, recent travel.  ED course: She is hemodynamically stable except hypertension. Temp 98.6, HR 73, RR 19, BP 121/58, SPO2 97% on room air. Labs include sodium 136, potassium 3.8, chloride 102, bicarb 25, glucose 133, BUN 17, creatinine 0.88, calcium 9.2, anion gap 9, alkaline phosphatase 586, albumin 3.4, AST 70, ALT 71, total protein 8.0, total bilirubin 1.7, troponin 14> 15, lactic acid 1.4, WBC 10.2, hemoglobin 14.9, hematocrit 46.1, platelet 246, influenza negative, COVID-negative, UA nitrites positive. CT head : No acute intracranial process. Stable small calcified meningioma in right posterior fossa.  Chest x-ray no active lung disease. EKG : Normal sinus rhythm, left atrial enlargement.  Review of Systems: Review of Systems  Constitutional: Negative.   HENT: Negative.    Eyes: Negative.   Respiratory:  Positive for cough.   Cardiovascular: Negative.   Gastrointestinal: Negative.   Genitourinary: Negative.   Musculoskeletal: Negative.   Skin: Negative.   Neurological:  Positive for weakness.       Generalized weakness.  Endo/Heme/Allergies: Negative.    Psychiatric/Behavioral: Negative.     Past Medical History:  Diagnosis Date   Cyst    hx o on right breast   DJD (degenerative joint disease)    right knee   Glaucoma    Hypertension    Pneumonia due to COVID-19 virus 12/12/2020   Sleep apnea    Thyroid disease    Past Surgical History:  Procedure Laterality Date   ABDOMINAL HYSTERECTOMY     VITRECTOMY  July 2012   right eye with membrane peel [name], [place])   Social History:  reports that she has never smoked. She has never used smokeless tobacco. She reports that she does not drink alcohol and does not use drugs.  No Known Allergies  Family History  Problem Relation Age of Onset   Cancer Father    Hypertension Father    Heart attack Mother    Hypertension Mother     Prior to Admission medications   Medication Sig Start Date End Date Taking? Authorizing Provider  Ascorbic Acid (VITAMIN C) 1000 MG tablet Take 1 tablet (1,000 mg total) by mouth daily. 09/04/21   [name], MD  atenolol (TENORMIN) 25 MG tablet TAKE 1 TABLET BY MOUTH TWICE DAILY 07/07/21   [name], MD  Calcium Carb-Cholecalciferol (CALCIUM 1000 + D PO) Take 1 tablet by mouth daily.    [provider]  dorzolamide-timolol (COSOPT) 22.3-6.8 MG/ML ophthalmic solution Place 1 drop into both eyes 2 (two) times daily.    [provider]  famotidine (PEPCID) 20 MG tablet TAKE 1 TABLET BY MOUTH 2 TIMES DAILY 07/07/21   [name], MD  fish oil-omega-3 fatty acids 1000 MG capsule Take 2 capsules (2 g total) by mouth daily. 09/04/21   [name], MD  furosemide (LASIX) 20 MG tablet Take 1 tablet (20 mg total) by mouth daily. TAKE ONE TABLET BY MOUTH DAILY AS NEEDED FOR FLUID RETENTION 09/04/21   [name], MD  losartan (COZAAR) 50 MG tablet Take 50 mg by mouth daily.    [provider]  Multiple Vitamin (MULTIVITAMIN WITH MINERALS) TABS tablet Take 1 tablet by mouth daily. 11/27/20   Regalado, Belkys A, MD  umeclidinium  bromide (INCRUSE ELLIPTA) 62.5 MCG/ACT AEPB Inhale 1 puff into the lungs daily.    [provider]  zinc sulfate 220 (50 Zn) MG capsule Take 1 capsule (220 mg total) by mouth daily. 09/04/21   [name], MD    Physical Exam: Vitals:   11/12/21 1151 11/12/21 1237 11/12/21 1330 11/12/21 1400  BP: (!) 141/59 115/60 139/61 (!) 121/58  Pulse: 74 72 82 73  Resp: (!) 22 20 (!) 26 19  Temp:      TempSrc:      SpO2: 98% 96% 98% 97%  Weight:      Height:       General exam: Appears comfortable, not in any acute distress.  Deconditioned Respiratory system: CTA bilaterally, no wheezing, no crackles, normal respiratory effort. Cardiovascular system: S1-S2 heard, regular rate and rhythm, no murmur. Gastrointestinal system: Abdomen is soft, nontender, nondistended, BS+ Central nervous system: Alert, oriented x 2, no focal neurological deficits. Extremities: No edema, no cyanosis, no clubbing. Psychiatry:Mood, insight, judgment normal  Data Reviewed: I have Reviewed nursing notes, Vitals, and Lab results since pt's last encounter. Pertinent lab results CBC, BMP, mag, Phos, UA I have ordered test including CBC, CMP, mag, Phos I have independently visualized and interpreted imaging CT head which showed no acute infiltrate. I have independently visualized and interpreted EKG which showed EKG: normal EKG, normal sinus rhythm, unchanged from previous tracings, left atrial enlargement. I have discussed pt's care plan and test results with patient and daughter.   Assessment and Plan: * Generalized weakness Patient presented with generalized weakness, slumped over on the left side. CT head unremarkable, UA consistent with UTI. TIA less likely, no focal neurological deficits noted. Initiated empirically on ceftriaxone, continue IV hydration. Follow-up urine culture.  UTI (urinary tract infection) Continue ceftriaxone daily, follow-up urine cultures.  Personal history of PE (pulmonary  embolism) Patient report history of pulmonary embolism but not on anticoagulation.  Hyperlipidemia LDL goal <100 Obtain lipid profile.  Acquired hypothyroidism Obtain total thyroid profile.  Can consider levothyroxine if required.  Essential hypertension Continue atenolol 25 mg twice daily, losartan 50 mg daily.    Advance Care Planning:   Code Status: DNR   Consults: None  Family Communication: Daughter at bed side.  Severity of Illness: The appropriate patient status for this patient is OBSERVATION. Observation status is judged to be reasonable and necessary in order to provide the required intensity of service to ensure the patient's safety. The patient's presenting symptoms, physical exam findings, and initial radiographic and laboratory data in the context of their medical condition is felt to place them at decreased risk for further clinical deterioration. Furthermore, it is anticipated that the patient will be medically stable for discharge from the hospital within 2 midnights of admission.   Author: Shawna Clamp, MD 11/12/2021 3:08 PM  For on call review [email or web address].

## 2021-11-12 NOTE — ED Notes (Signed)
Patient given [place] sandwich tray and coke as requested.

## 2021-11-12 NOTE — Assessment & Plan Note (Signed)
Continue atenolol 25 mg twice daily, losartan 50 mg daily.

## 2021-11-13 ENCOUNTER — Telehealth: Payer: Self-pay | Admitting: Internal Medicine

## 2021-11-13 DIAGNOSIS — M6281 Muscle weakness (generalized): Secondary | ICD-10-CM | POA: Diagnosis not present

## 2021-11-13 DIAGNOSIS — R531 Weakness: Secondary | ICD-10-CM | POA: Diagnosis not present

## 2021-11-13 LAB — COMPREHENSIVE METABOLIC PANEL
ALT: 54 U/L — ABNORMAL HIGH (ref 0–44)
AST: 50 U/L — ABNORMAL HIGH (ref 15–41)
Albumin: 2.7 g/dL — ABNORMAL LOW (ref 3.5–5.0)
Alkaline Phosphatase: 437 U/L — ABNORMAL HIGH (ref 38–126)
Anion gap: 8 (ref 5–15)
BUN: 14 mg/dL (ref 8–23)
CO2: 22 mmol/L (ref 22–32)
Calcium: 8.1 mg/dL — ABNORMAL LOW (ref 8.9–10.3)
Chloride: 106 mmol/L (ref 98–111)
Creatinine, Ser: 0.65 mg/dL (ref 0.44–1.00)
GFR, Estimated: >60 mL/min (ref >60)
Glucose, Bld: 112 mg/dL — ABNORMAL HIGH (ref 70–99)
Potassium: 3.4 mmol/L — ABNORMAL LOW (ref 3.5–5.1)
Sodium: 136 mmol/L (ref 135–145)
Total Bilirubin: 1.2 mg/dL (ref 0.3–1.2)
Total Protein: 6.6 g/dL (ref 6.5–8.1)

## 2021-11-13 LAB — LIPID PANEL
Cholesterol: 165 mg/dL (ref 0–200)
HDL: 33 mg/dL — ABNORMAL LOW (ref >40)
LDL Cholesterol: 113 mg/dL — ABNORMAL HIGH (ref 0–99)
Total CHOL/HDL Ratio: 5 RATIO
Triglycerides: 95 mg/dL (ref <150)
VLDL: 19 mg/dL (ref 0–40)

## 2021-11-13 LAB — CBC
HCT: 39.8 % (ref 36.0–46.0)
Hemoglobin: 12.6 g/dL (ref 12.0–15.0)
MCH: 29.1 pg (ref 26.0–34.0)
MCHC: 31.7 g/dL (ref 30.0–36.0)
MCV: 91.9 fL (ref 80.0–100.0)
Platelets: 200 10*3/uL (ref 150–400)
RBC: 4.33 MIL/uL (ref 3.87–5.11)
RDW: 13.2 % (ref 11.5–15.5)
WBC: 8.2 10*3/uL (ref 4.0–10.5)
nRBC: 0 % (ref 0.0–0.2)

## 2021-11-13 LAB — PHOSPHORUS: Phosphorus: 2.9 mg/dL (ref 2.5–4.6)

## 2021-11-13 LAB — MAGNESIUM: Magnesium: 2 mg/dL (ref 1.7–2.4)

## 2021-11-13 MED ORDER — ENOXAPARIN SODIUM 40 MG/0.4ML IJ SOSY: 40.0000 mg | PREFILLED_SYRINGE | INTRAMUSCULAR | Status: DC

## 2021-11-13 MED ORDER — CEPHALEXIN 500 MG PO CAPS: 500.0000 mg | ORAL_CAPSULE | Freq: Two times a day (BID) | ORAL | 0 refills | Status: DC

## 2021-11-13 NOTE — Evaluation (Signed)
Physical Therapy Evaluation Patient Details Name: Lauren Morris MRN: [id number] DOB: 1931/12/29 Today's Date: 11/13/2021  History of Present Illness  Pt admitted for weakness and falls. History includes HTN, PE, and glacoma.  Clinical Impression  Pt is a pleasant 88 year old female who was admitted for weakness/falls. Pt performs bed mobility, transfers, and ambulation with cga and RW. Cued for use of AD for all mobility at this time for fall prevention.  Pt demonstrates deficits with strength/balance/mobility. Equal weakness on B LEs, no sensation deficit. Would benefit from skilled PT to address above deficits and promote optimal return to PLOF. Recommend transition to [facility] upon discharge from acute hospitalization.      Recommendations for follow up therapy are one component of a multi-disciplinary discharge planning process, led by the attending physician.  Recommendations may be updated based on patient status, additional functional criteria and insurance authorization.  Follow Up Recommendations Home health PT    Assistance Recommended at Discharge Intermittent Supervision/Assistance  Patient can return home with the following  A little help with walking and/or transfers;A little help with bathing/dressing/bathroom;Help with stairs or ramp for entrance    Equipment Recommendations None recommended by PT  Recommendations for Other Services       Functional Status Assessment Patient has had a recent decline in their functional status and demonstrates the ability to make significant improvements in function in a reasonable and predictable amount of time.     Precautions / Restrictions Precautions Precautions: Fall Restrictions Weight Bearing Restrictions: No      Mobility  Bed Mobility Overal bed mobility: Needs Assistance Bed Mobility: Supine to Sit     Supine to sit: Min guard     General bed mobility comments: using railings and once seated, upright posture noted.     Transfers Overall transfer level: Needs assistance Equipment used: Rolling walker (2 wheels) Transfers: Sit to/from Stand Sit to Stand: Min guard           General transfer comment: safe technique, however does attempt to pull up on RW. ONce standing, needs cues for pulling up undergarmets with pads.    Ambulation/Gait Ambulation/Gait assistance: Min guard Gait Distance (Feet): 40 Feet Assistive device: Rolling walker (2 wheels) Gait Pattern/deviations: Step-through pattern       General Gait Details: ambulated in room, per patient request. Pt with heavy incontience during ambulation requiring return back to chair. All mobility performed on RA with sats at 94%.  Stairs            Wheelchair Mobility    Modified Rankin (Stroke Patients Only)       Balance Overall balance assessment: Needs assistance, History of Falls Sitting-balance support: Feet supported Sitting balance-Leahy Scale: Good     Standing balance support: Bilateral upper extremity supported Standing balance-Leahy Scale: Good                               Pertinent Vitals/Pain Pain Assessment Pain Assessment: No/denies pain    Home Living Family/patient expects to be discharged to:: Private residence Living Arrangements: Alone Available Help at Discharge: Family;Available 24 hours/day Type of Home: House Home Access: Stairs to enter Entrance Stairs-Rails: Right Entrance Stairs-Number of Steps: 2   Home Layout: One level Home Equipment: Conservation officer, nature (2 wheels);Cane - single point;Shower seat - built in      Prior Function Prior Level of Function : Independent/Modified Independent;Driving  Mobility Comments: was previously using SPC outside of home, and no AD in house ADLs Comments: indep     Hand Dominance        Extremity/Trunk Assessment   Upper Extremity Assessment Upper Extremity Assessment: Generalized weakness (B UE grossly 4/5)     Lower Extremity Assessment Lower Extremity Assessment: Generalized weakness (B LE grossly 4/5)       Communication   Communication: No difficulties  Cognition Arousal/Alertness: Awake/alert Behavior During Therapy: WFL for tasks assessed/performed Overall Cognitive Status: Within Functional Limits for tasks assessed                                          General Comments      Exercises Other Exercises Other Exercises: min assist for donning brief. incontient episode during mobility. Assisted with hygiene   Assessment/Plan    PT Assessment Patient needs continued PT services  PT Problem List Decreased strength;Decreased activity tolerance;Decreased balance;Decreased mobility       PT Treatment Interventions DME instruction;Gait training;Therapeutic exercise    PT Goals (Current goals can be found in the Care Plan section)  Acute Rehab PT Goals Patient Stated Goal: to get stronger PT Goal Formulation: With patient Time For Goal Achievement: 11/27/21 Potential to Achieve Goals: Good    Frequency Min 2X/week     Co-evaluation               AM-PAC PT "6 Clicks" Mobility  Outcome Measure Help needed turning from your back to your side while in a flat bed without using bedrails?: None Help needed moving from lying on your back to sitting on the side of a flat bed without using bedrails?: A Little Help needed moving to and from a bed to a chair (including a wheelchair)?: A Little Help needed standing up from a chair using your arms (e.g., wheelchair or bedside chair)?: A Little Help needed to walk in hospital room?: A Little Help needed climbing 3-5 steps with a railing? : A Little 6 Click Score: 19    End of Session   Activity Tolerance: Patient tolerated treatment well Patient left: in chair;with chair alarm set Nurse Communication: Mobility status PT Visit Diagnosis: Unsteadiness on feet (R26.81);Muscle weakness (generalized)  (M62.81);Difficulty in walking, not elsewhere classified (R26.2)    Time: 1005-1047 PT Time Calculation (min) (ACUTE ONLY): 42 min   Charges:   PT Evaluation $PT Eval Low Complexity: 1 Low PT Treatments $Therapeutic Activity: 8-22 mins        Greggory Stallion, PT, DPT, [facility] [phone]   [name] 11/13/2021, 1:22 PM

## 2021-11-13 NOTE — Plan of Care (Signed)
VSS, patient alert and oriented, IV removed, discharge instructions reviewed with family and patient and patient discharged to home.  Home health to contact family tomorrow.

## 2021-11-13 NOTE — Evaluation (Signed)
Occupational Therapy Evaluation Patient Details Name: Lauren Morris MRN: [id number] DOB: 07/12/1931 Today's Date: 11/13/2021   History of Present Illness Pt admitted for weakness and falls. History includes HTN, PE, and glacoma.   Clinical Impression   Patient presenting with decreased Ind in self care, balance, functional mobility/transfers,endurance, and safety awareness. Patient reports living at home alone with use of [facility] for mobility in community. She is able to perform ADLs independently but daughter does come over when pt gets into shower for safety.Pt incontinent of urine when standing from recliner chair. Min guard assist to transfer onto [facility] in an attempt to empty bladder. Pt able to perform hygiene with set up A while seated on commode. Assistance to thread mesh underwear onto B feet. OT assisting with placement of purewick, brief, and pads in order to return to recliner chair. Pt reports she wear a disposable brief at home.   Patient will benefit from acute OT to increase overall independence in the areas of ADLs, functional mobility, and safety awareness in order to safely discharge home with family.      Recommendations for follow up therapy are one component of a multi-disciplinary discharge planning process, led by the attending physician.  Recommendations may be updated based on patient status, additional functional criteria and insurance authorization.   Follow Up Recommendations  Home health OT    Assistance Recommended at Discharge Intermittent Supervision/Assistance  Patient can return home with the following A little help with walking and/or transfers;A little help with bathing/dressing/bathroom;Help with stairs or ramp for entrance;Assist for transportation;Assistance with cooking/housework    Functional Status Assessment  Patient has had a recent decline in their functional status and demonstrates the ability to make significant improvements in function in a reasonable  and predictable amount of time.  Equipment Recommendations  None recommended by OT       Precautions / Restrictions Precautions Precautions: Fall Restrictions Weight Bearing Restrictions: No      Mobility Bed Mobility               General bed mobility comments: seated in recliner chair    Transfers Overall transfer level: Needs assistance Equipment used: Rolling walker (2 wheels) Transfers: Sit to/from Stand Sit to Stand: Min guard                  Balance Overall balance assessment: Needs assistance, History of Falls Sitting-balance support: Feet supported Sitting balance-Leahy Scale: Good     Standing balance support: Bilateral upper extremity supported Standing balance-Leahy Scale: Good                             ADL either performed or assessed with clinical judgement   ADL Overall ADL's : Needs assistance/impaired     Grooming: Wash/dry hands;Sitting;Set up;Supervision/safety               Lower Body Dressing: Minimal assistance;Sit to/from stand   Toilet Transfer: Min guard;BSC/3in1   Toileting- Water quality scientist and Hygiene: Minimal assistance;Sit to/from stand               Vision Baseline Vision/History: 1 Wears glasses Patient Visual Report: No change from baseline              Pertinent Vitals/Pain Pain Assessment Pain Assessment: No/denies pain     Hand Dominance Right   Extremity/Trunk Assessment Upper Extremity Assessment Upper Extremity Assessment: Generalized weakness   Lower Extremity Assessment Lower Extremity Assessment: Generalized  weakness       Communication Communication Communication: No difficulties   Cognition Arousal/Alertness: Awake/alert Behavior During Therapy: WFL for tasks assessed/performed Overall Cognitive Status: Within Functional Limits for tasks assessed                                                  Home Living Family/patient expects to  be discharged to:: Private residence Living Arrangements: Alone Available Help at Discharge: Family;Available 24 hours/day Type of Home: House Home Access: Stairs to enter [employer] of Steps: 2 Entrance Stairs-Rails: Right Home Layout: One level     Bathroom Shower/Tub: Occupational psychologist: Standard     Home Equipment: Conservation officer, nature (2 wheels);Cane - single point;Shower seat - built in          Prior Functioning/Environment Prior Level of Function : Independent/Modified Independent;Driving             Mobility Comments: was previously using SPC outside of home, and no AD in house ADLs Comments: indep but her daughter is over at her home when she gets into the shower for safety        OT Problem List: Decreased strength;Decreased activity tolerance;Impaired balance (sitting and/or standing);Decreased safety awareness      OT Treatment/Interventions: Self-care/ADL training;Balance training;Therapeutic activities;Therapeutic exercise;Energy conservation;DME and/or AE instruction;Visual/perceptual remediation/compensation;Patient/family education    OT Goals(Current goals can be found in the care plan section) Acute Rehab OT Goals Patient Stated Goal: to go home OT Goal Formulation: With patient/family Time For Goal Achievement: 11/27/21 Potential to Achieve Goals: Fair ADL Goals Pt Will Perform Grooming: with supervision;standing Pt Will Perform Lower Body Dressing: with supervision;sit to/from stand Pt Will Transfer to Toilet: with supervision Pt Will Perform Toileting - Clothing Manipulation and hygiene: with supervision  OT Frequency: Min 2X/week       AM-PAC OT "6 Clicks" Daily Activity     Outcome Measure Help from another person eating meals?: None Help from another person taking care of personal grooming?: A Little Help from another person toileting, which includes using toliet, bedpan, or urinal?: A Little Help from another  person bathing (including washing, rinsing, drying)?: A Little Help from another person to put on and taking off regular upper body clothing?: None Help from another person to put on and taking off regular lower body clothing?: A Little 6 Click Score: 20   End of Session Equipment Utilized During Treatment: Other (comment) (3 in 1) Nurse Communication: Mobility status  Activity Tolerance: Patient tolerated treatment well;Patient limited by fatigue Patient left: in bed;with call bell/phone within reach;with bed alarm set  OT Visit Diagnosis: Unsteadiness on feet (R26.81);Repeated falls (R29.6);Muscle weakness (generalized) (M62.81)                Time: 5320-2334 OT Time Calculation (min): 35 min Charges:  OT General Charges $OT Visit: 1 Visit OT Evaluation $OT Eval Moderate Complexity: 1 Mod OT Treatments $Self Care/Home Management : 23-37 mins Darleen Crocker, MS, OTR/L , CBIS ascom [phone]  11/13/21, 3:41 PM

## 2021-11-13 NOTE — Plan of Care (Signed)

## 2021-11-13 NOTE — Progress Notes (Signed)
Anticoagulation monitoring(Lovenox):  89yo  F ordered Lovenox 30 mg Q24h    Filed Weights   11/12/21 1047  Weight: 72 kg (158 lb 11.7 oz)   BMI 29   Lab Results  Component Value Date   CREATININE 0.65 11/13/2021   CREATININE 0.91 11/12/2021   CREATININE 0.88 11/12/2021   Estimated Creatinine Clearance: 46.1 mL/min (by C-G formula based on SCr of 0.65 mg/dL). Hemoglobin & Hematocrit     Component Value Date/Time   HGB 12.6 11/13/2021 0531   HCT 39.8 11/13/2021 0531     Per Protocol for Patient with estCrcl now >30 ml/min and BMI < 30, will transition to Lovenox 40 mg Q24h      Chinita Greenland PharmD Clinical Pharmacist 11/13/2021

## 2021-11-13 NOTE — Telephone Encounter (Signed)
Patient was scheduled for a Hospital follow up for 11/16/2021. Patient is being released today from the hospital.

## 2021-11-13 NOTE — Telephone Encounter (Signed)
Noted. Will follow.  

## 2021-11-13 NOTE — TOC Initial Note (Signed)
Transition of Care [facility]) - Initial/Assessment Note    Patient Details  Name: Lauren Morris MRN: [id number] Date of Birth: 1932/03/25  Transition of Care [facility]) CM/SW Contact:    Beverly Sessions, RN Phone Number: 11/13/2021, 3:13 PM  Clinical Narrative:                  Admitted for: UTi, generalized weakness Admitted from: Home alone [id number] Current home health/prior home health/DME: [facility]  Therapy recommending home health.  Patient in agreement.  Does not recall what agency she had in Feb, but had [facility] in June 2022 and was satisfied with her services.  She would like to use them again  Referral made and accepted by Corene Cornea with [facility]   Expected Discharge Plan: [place] Barriers to Discharge: Barriers Resolved   Patient Goals and CMS Choice        Expected Discharge Plan and Services Expected Discharge Plan: [place]       Living arrangements for the past 2 months: Single Family Home Expected Discharge Date: 11/13/21                         HH Arranged: PT, OT, RN [place] Agency: [place]) Date HH Agency Contacted: 11/13/21   Representative spoke with at [place]: Corene Cornea  Prior Living Arrangements/Services Living arrangements for the past 2 months: [place] Lives with:: Self Patient language and need for interpreter reviewed:: Yes Do you feel safe going back to the place where you live?: Yes      Need for Family Participation in Patient Care: Yes (Comment) Care giver support system in place?: Yes (comment) Current home services: DME Criminal Activity/Legal Involvement Pertinent to Current Situation/Hospitalization: No - Comment as needed  Activities of Daily Living Home Assistive Devices/Equipment: Cane (specify quad or straight) ADL Screening (condition at time of admission) Patient's cognitive ability adequate to safely complete daily activities?: Yes Is the  patient deaf or have difficulty hearing?: No Does the patient have difficulty seeing, even when wearing glasses/contacts?: No Does the patient have difficulty concentrating, remembering, or making decisions?: No Patient able to express need for assistance with ADLs?: Yes Does the patient have difficulty dressing or bathing?: No Independently performs ADLs?: Yes (appropriate for developmental age) Does the patient have difficulty walking or climbing stairs?: No Weakness of Legs: Both Weakness of Arms/Hands: None  Permission Sought/Granted                  Emotional Assessment       Orientation: : Oriented to Self, Oriented to  Time, Oriented to Place, Oriented to Situation Alcohol / Substance Use: Not Applicable Psych Involvement: No (comment)  Admission diagnosis:  Dehydration [E86.0] Acute cystitis without hematuria [N30.00] Generalized weakness [R53.1] Patient Active Problem List   Diagnosis Date Noted   UTI (urinary tract infection) 11/12/2021   Dizziness 08/03/2021   Do not resuscitate status 08/03/2021   Musculoskeletal back pain 08/03/2021   Meningioma ([place]) 08/03/2021   Weakness 08/02/2021   Vertigo 08/02/2021   Personal history of PE (pulmonary embolism) 12/12/2020   Obesity, diabetes, and hypertension syndrome ([place]) 11/24/2020   Chronic bronchitis ([place]) 01/16/2020   Abnormal EKG 07/07/2019   Generalized weakness 07/07/2019   History of thyrotoxicosis 07/07/2019   Abnormal chest CT 05/25/2016   Fatty liver disease, nonalcoholic 73/53/2992   Do not resuscitate discussion 03/11/2012   Hyperlipidemia  LDL goal <100 04/15/2011   Essential hypertension 02/22/2011   Acquired hypothyroidism 02/22/2011   PCP:  [name], MD Pharmacy:   [place], [place] - [address] [place] [address] Phone: [phone] Fax: [phone]     Social Determinants of Health (SDOH) Interventions    Readmission Risk  Interventions     View : No data to display.

## 2021-11-13 NOTE — Discharge Summary (Signed)
Physician Discharge Summary   Patient: Lauren Morris MRN: [id number] DOB: June 23, 1932  Admit date:     11/12/2021  Discharge date: 11/13/21  Discharge Physician: Lorella Nimrod   PCP: [name], MD   Recommendations at discharge:  Follow-up final urine culture results Follow-up with primary care provider within a week  Discharge Diagnoses: Principal Problem:   Generalized weakness Active Problems:   Essential hypertension   Acquired hypothyroidism   Hyperlipidemia LDL goal <100   Obesity, diabetes, and hypertension syndrome (HCC)   Personal history of PE (pulmonary embolism)   Meningioma (HCC)   UTI (urinary tract infection)   Hospital Course: This 88 years old female with PMH significant for hypertension, hypothyroidism, PE not on anticoagulation presented in the ED from assisted living facility with generalized weakness since yesterday.  Patient reports progressive worsening of weakness and yesterday she slumped over on the left side and was unable to stand up. She also reports cough,  dry mouth but denies any focal symptoms.  Patient usually uses a walker fibrillation but was unable to get up today.  Daughter at bedside denies any fever, chills, diarrhea, sick contacts, recent travel.  ED course: She is hemodynamically stable except hypertension. Temp 98.6, HR 73, RR 19, BP 121/58, SPO2 97% on room air. Labs include sodium 136, potassium 3.8, chloride 102, bicarb 25, glucose 133, BUN 17, creatinine 0.88, calcium 9.2, anion gap 9, alkaline phosphatase 586, albumin 3.4, AST 70, ALT 71, total protein 8.0, total bilirubin 1.7, troponin 14> 15, lactic acid 1.4, WBC 10.2, hemoglobin 14.9, hematocrit 46.1, platelet 246, influenza negative, COVID-negative, UA nitrites positive. CT head : No acute intracranial process. Stable small calcified meningioma in right posterior fossa.  Chest x-ray no active lung disease. EKG : Normal sinus rhythm, left atrial enlargement.  Patient was placed  on observation for concern of UTI and generalized weakness.  Started on ceftriaxone.    5/22: Unfortunately urine cultures were not done in ED.  Ordered as add-on as she is already on antibiotics.  Patient does not have any specific urinary symptoms.  She had dry cough mostly at night for years, no recent change.  Right upper quadrant ultrasound was negative for any significant abnormality.  She received ceftriaxone and we decided to treat her concern of UTI as an elderly sometime the only presentation is generalized weakness.  She was given 5 days of Keflex for home. Over physical therapist also evaluated her and they are recommending home health services which were ordered.  She will continue with her home medications and follow-up with her providers.  Assessment and Plan: * Generalized weakness Patient presented with generalized weakness, slumped over on the left side. CT head unremarkable, UA consistent with UTI. TIA less likely, no focal neurological deficits noted. Initiated empirically on ceftriaxone, continue IV hydration. Follow-up urine culture.  UTI (urinary tract infection) Continue ceftriaxone daily, follow-up urine cultures.  Personal history of PE (pulmonary embolism) Patient report history of pulmonary embolism but not on anticoagulation.  Hyperlipidemia LDL goal <100 Obtain lipid profile.  Acquired hypothyroidism Obtain total thyroid profile.  Can consider levothyroxine if required.  Essential hypertension Continue atenolol 25 mg twice daily, losartan 50 mg daily.   Consultants: None Procedures performed: None Disposition: Home health Diet recommendation:  Discharge Diet Orders (From admission, onward)     Start     Ordered   11/13/21 0000  Diet - low sodium heart healthy        11/13/21 1341  Cardiac diet DISCHARGE MEDICATION: Allergies as of 11/13/2021   No Known Allergies      Medication List     STOP taking these medications     Incruse Ellipta 62.5 MCG/ACT Aepb Generic drug: umeclidinium bromide       TAKE these medications    atenolol 25 MG tablet Commonly known as: TENORMIN TAKE 1 TABLET BY MOUTH TWICE DAILY   CALCIUM 1000 + D PO Take 1 tablet by mouth daily.   cephALEXin 500 MG capsule Commonly known as: KEFLEX Take 1 capsule (500 mg total) by mouth 2 (two) times daily for 5 days.   dorzolamide-timolol 22.3-6.8 MG/ML ophthalmic solution Commonly known as: COSOPT Place 1 drop into both eyes 2 (two) times daily.   famotidine 20 MG tablet Commonly known as: PEPCID TAKE 1 TABLET BY MOUTH 2 TIMES DAILY   fish oil-omega-3 fatty acids 1000 MG capsule Take 2 capsules (2 g total) by mouth daily.   furosemide 20 MG tablet Commonly known as: LASIX Take 1 tablet (20 mg total) by mouth daily. TAKE ONE TABLET BY MOUTH DAILY AS NEEDED FOR FLUID RETENTION   multivitamin with minerals Tabs tablet Take 1 tablet by mouth daily.   Spiriva HandiHaler 18 MCG inhalation capsule Generic drug: tiotropium Place 1 capsule into inhaler and inhale daily.   telmisartan 40 MG tablet Commonly known as: MICARDIS Take 40 mg by mouth at bedtime.   Visine Dry Eye 0.2-0.2-1 % Soln Generic drug: Glycerin-Hypromellose-PEG 400 Place 1 drop into both eyes 3 (three) times daily.   vitamin C 1000 MG tablet Take 1 tablet (1,000 mg total) by mouth daily.   zinc sulfate 220 (50 Zn) MG capsule Take 1 capsule (220 mg total) by mouth daily.        Follow-up Information     [name], MD. Schedule an appointment as soon as possible for a visit in 1 week(s).   Specialty: Internal Medicine Contact information: [address] [place] [address] [phone]                Discharge Exam: Danley Danker Weights   11/12/21 1047  Weight: 72 kg   General.     In no acute distress. Pulmonary.  Lungs clear bilaterally, normal respiratory effort. CV.  Regular rate and rhythm, no JVD, rub or  murmur. Abdomen.  Soft, nontender, nondistended, BS positive. CNS.  Alert and oriented .  No focal neurologic deficit. Extremities.  No edema, no cyanosis, pulses intact and symmetrical. Psychiatry.  Judgment and insight appears normal.   Condition at discharge: stable  The results of significant diagnostics from this hospitalization (including imaging, microbiology, ancillary and laboratory) are listed below for reference.   Imaging Studies: DG Chest 2 View  Result Date: 11/12/2021 CLINICAL DATA:  Cough.  Generalized weakness. EXAM: CHEST - 2 VIEW COMPARISON:  08/02/2021 FINDINGS: Stable mild cardiomegaly. Aortic atherosclerotic calcification incidentally noted. Both lungs are clear. IMPRESSION: Stable mild cardiomegaly. No active lung disease. Electronically Signed   By: Marlaine Hind M.D.   On: 11/12/2021 12:01   CT Head Wo Contrast  Result Date: 11/12/2021 CLINICAL DATA:  Acute neurologic deficit. Weakness and unsteadiness beginning yesterday. EXAM: CT HEAD WITHOUT CONTRAST TECHNIQUE: Contiguous axial images were obtained from the base of the skull through the vertex without intravenous contrast. RADIATION DOSE REDUCTION: This exam was performed according to the departmental dose-optimization program which includes automated exposure control, adjustment of the mA and/or kV according to patient size and/or use of  iterative reconstruction technique. COMPARISON:  08/03/2021 FINDINGS: Brain: No evidence of intracranial hemorrhage, acute infarction, hydrocephalus, or extra-axial fluid collection. A small calcified meningioma is seen in the right lateral posterior fossa adjacent to the cerebellar hemisphere, measuring 1.5 x 0.9 cm on image 8/2 this remains stable compared prior study. No evidence of mass effect or shift. Vascular: No hyperdense vessel or other acute findings. Skull: No evidence of fracture or other significant bone abnormality. Sinuses/Orbits:  No acute findings. Other: None.  IMPRESSION: No acute intracranial abnormality. Stable small calcified meningioma in right posterior fossa. Electronically Signed   By: Marlaine Hind M.D.   On: 11/12/2021 13:45    Microbiology: Results for orders placed or performed during the hospital encounter of 11/12/21  Resp Panel by RT-PCR (Flu A&B, Covid) Nasopharyngeal Swab     Status: None   Collection Time: 11/12/21 10:50 AM   Specimen: Nasopharyngeal Swab; Nasopharyngeal(NP) swabs in vial transport medium  Result Value Ref Range Status   SARS Coronavirus 2 by RT PCR NEGATIVE NEGATIVE Final    Comment: (NOTE) SARS-CoV-2 target nucleic acids are NOT DETECTED.  The SARS-CoV-2 RNA is generally detectable in upper respiratory specimens during the acute phase of infection. The lowest concentration of SARS-CoV-2 viral copies this assay can detect is 138 copies/mL. A negative result does not preclude SARS-Cov-2 infection and should not be used as the sole basis for treatment or other patient management decisions. A negative result may occur with  improper specimen collection/handling, submission of specimen other than nasopharyngeal swab, presence of viral mutation(s) within the areas targeted by this assay, and inadequate number of viral copies(<138 copies/mL). A negative result must be combined with clinical observations, patient history, and epidemiological information. The expected result is Negative.  Fact Sheet for Patients:  EntrepreneurPulse.com.au  Fact Sheet for Healthcare Providers:  IncredibleEmployment.be  This test is no t yet approved or cleared by the [place] FDA and  has been authorized for detection and/or diagnosis of SARS-CoV-2 by FDA under an Emergency Use Authorization (EUA). This EUA will remain  in effect (meaning this test can be used) for the duration of the COVID-19 declaration under Section 564(b)(1) of the Act, 21 U.S.C.section 360bbb-3(b)(1), unless the  authorization is terminated  or revoked sooner.       Influenza A by PCR NEGATIVE NEGATIVE Final   Influenza B by PCR NEGATIVE NEGATIVE Final    Comment: (NOTE) The Xpert Xpress SARS-CoV-2/FLU/RSV plus assay is intended as an aid in the diagnosis of influenza from Nasopharyngeal swab specimens and should not be used as a sole basis for treatment. Nasal washings and aspirates are unacceptable for Xpert Xpress SARS-CoV-2/FLU/RSV testing.  Fact Sheet for Patients: EntrepreneurPulse.com.au  Fact Sheet for Healthcare Providers: IncredibleEmployment.be  This test is not yet approved or cleared by the [place] FDA and has been authorized for detection and/or diagnosis of SARS-CoV-2 by FDA under an Emergency Use Authorization (EUA). This EUA will remain in effect (meaning this test can be used) for the duration of the COVID-19 declaration under Section 564(b)(1) of the Act, 21 U.S.C. section 360bbb-3(b)(1), unless the authorization is terminated or revoked.  Performed at [facility], [address] [place], [place] [address]     Labs: CBC: Recent Labs  Lab 11/12/21 1050 11/12/21 1638 11/13/21 0531  WBC 10.2 10.9* 8.2  NEUTROABS 7.4  --   --   HGB 14.9 13.4 12.6  HCT 46.1* 42.2 39.8  MCV 90.4 90.9 91.9  PLT 246 220 200  Basic Metabolic Panel: Recent Labs  Lab 11/12/21 1050 11/12/21 1638 11/13/21 0531  NA 136  --  136  K 3.8  --  3.4*  CL 102  --  106  CO2 25  --  22  GLUCOSE 133*  --  112*  BUN 17  --  14  CREATININE 0.88 0.91 0.65  CALCIUM 9.2  --  8.1*  MG  --   --  2.0  PHOS  --   --  2.9   Liver Function Tests: Recent Labs  Lab 11/12/21 1050 11/13/21 0531  AST 70* 50*  ALT 71* 54*  ALKPHOS 586* 437*  BILITOT 1.7* 1.2  PROT 8.0 6.6  ALBUMIN 3.4* 2.7*   CBG: No results for input(s): GLUCAP in the last 168 hours.  Discharge time spent: greater than 30 minutes.  This record has been created  using Systems analyst. Errors have been sought and corrected,but may not always be located. Such creation errors do not reflect on the standard of care.   Signed: Lorella Nimrod, MD [facility] 11/13/2021

## 2021-11-14 ENCOUNTER — Ambulatory Visit: Payer: Medicare Other | Admitting: Internal Medicine

## 2021-11-14 LAB — THYROID PANEL
Free Thyroxine Index: 1.6 (ref 1.2–4.9)
T3 Uptake Ratio: 27 % (ref 24–39)
T4, Total: 5.9 ug/dL (ref 4.5–12.0)

## 2021-11-15 ENCOUNTER — Telehealth: Payer: Self-pay

## 2021-11-15 ENCOUNTER — Other Ambulatory Visit: Payer: Self-pay | Admitting: Internal Medicine

## 2021-11-15 LAB — URINE CULTURE: Culture: >=100000 — AB

## 2021-11-15 MED ORDER — CIPROFLOXACIN HCL 250 MG PO TABS: 250.0000 mg | ORAL_TABLET | Freq: Two times a day (BID) | ORAL | 0 refills | Status: AC

## 2021-11-15 NOTE — Telephone Encounter (Signed)
Transition Care Management Follow-up Telephone Call Date of discharge and from where: 11/13/21 [facility] How have you been since you were released from the hospital? Patient states, "I am doing okay. Weakness is slowly improving since discharge. Waking up to use the restroom at night but other than that I am resting well. No pain at all. Walking indoors to increase my strength. Eating/drinking well." Denies fever, pain and all harmful symptoms. Eating yogurt as directed. New medication ordered by PCP to be picked up today.  Any questions or concerns? No  Items Reviewed: Did the pt receive and understand the discharge instructions provided? Yes  Medications obtained and verified? Yes  Any new allergies since your discharge? No  Dietary orders reviewed? Yes, low sodium heart healthy. Do you have support at home? Yes   Home Care and Equipment/Supplies: Were home health services ordered? Yes, patient awaiting call back from [facility] for scheduling.   Functional Questionnaire: (I = Independent and D = Dependent) ADLs: I  Bathing/Dressing- Independent. For safety, does not bathe unless family is in the home. Lives alone.  Meal Prep- I  Eating- I  Maintaining continence- Brief/pad worn for leaking.  Transferring/Ambulation- Walker  Managing Meds- I  Follow up appointments reviewed:  PCP Hospital f/u appt confirmed? Yes  Scheduled to see PCP on 11/16/21 @ 11:00. [place] [facility] f/u appt confirmed? N/A   Are transportation arrangements needed? No  If their condition worsens, is the pt aware to call PCP or go to the Emergency Dept.? Yes Was the patient provided with contact information for the PCP's office or ED? Yes Was to pt encouraged to call back with questions or concerns? Yes

## 2021-11-16 ENCOUNTER — Encounter: Payer: Self-pay | Admitting: Internal Medicine

## 2021-11-16 ENCOUNTER — Ambulatory Visit (INDEPENDENT_AMBULATORY_CARE_PROVIDER_SITE_OTHER): Payer: Medicare Other | Admitting: Internal Medicine

## 2021-11-16 VITALS — BP 140/74 | HR 62 | Temp 97.9°F | Ht 62.0 in | Wt 160.8 lb

## 2021-11-16 DIAGNOSIS — D329 Benign neoplasm of meninges, unspecified: Secondary | ICD-10-CM

## 2021-11-16 DIAGNOSIS — Z09 Encounter for follow-up examination after completed treatment for conditions other than malignant neoplasm: Secondary | ICD-10-CM

## 2021-11-16 DIAGNOSIS — N3000 Acute cystitis without hematuria: Secondary | ICD-10-CM

## 2021-11-16 DIAGNOSIS — R531 Weakness: Secondary | ICD-10-CM

## 2021-11-16 NOTE — Assessment & Plan Note (Signed)
Patient is stable post discharge and has no new issues or questions about discharge plans at the visit today for hospital follow up.  I have reviewed the records from the hospital admission in detail with patient and daughter [name]  today.

## 2021-11-16 NOTE — Assessment & Plan Note (Signed)
Incidental finding during workup for acute dizziness.  Neurology follow up was postponed by hospital admission and will be rescheduled with provider in [place]

## 2021-11-16 NOTE — Patient Instructions (Signed)
  Please  continue eating your yogurt daily for  3 weeks to prevent a serious antibiotic associated diarrhea  Called clostridium dificile colitis    If your symptoms return, you can submit a urine specimen in the [place]

## 2021-11-16 NOTE — Assessment & Plan Note (Signed)
Her weakness was profound and asymmetric,  The only symptom of her UTI.

## 2021-11-16 NOTE — Progress Notes (Addendum)
Subjective:  Patient ID: Lauren Morris, female    DOB: 11-May-1932  Age: 88 y.o. MRN: [id number]  CC: The primary encounter diagnosis was Acute cystitis without hematuria. Diagnoses of Meningioma ([place]), Generalized weakness, and Hospital discharge follow-up were also pertinent to this visit.   HPI Lauren Morris presents for  Chief Complaint  Patient presents with   Hospitalization Follow-up   88 YR OLD female with type 2 DM, hypertension and aortic atherosclerosis was admitted to [facility] on May 21 with  profound bilateral lower extremity weakness that started the morning of presentation.  At home she was unable to stand without maximal assistance and daughter called EMS.  Per daughter [name] ,  she noted that the patient's left side was objectively weaker  than the right ) .  she was taken to the ER and iunderwnet a head CT that was negative for acute CVA.  (MRI was not repeated as she had received one in Feb 2023 during workup for acute dizziness and a right cerebellar meningioma was noted. )UA was suggestive of infection and empiric IV abx were started and patient was admitted ofr 23 hr obs.  She was observed for 24 hours and sent home with keflex for treatment of E Coli   UTI on May 23.   The Urine culture was sent to me and based on sensitivities I switched her to cipro yesterda y.  She is eating a serving of Activia daily    Fhe states that she is feeling better but still weak from lying in the hospital bed for over one day .  Waiting for home PT to start   Neurology appt for follow up on 16 cm right sided meningioma  was missed bc of the hospitalization  and will be rescheduled   Neuro exam normal  today , no strength deficits   Outpatient Medications Prior to Visit  Medication Sig Dispense Refill   Ascorbic Acid (VITAMIN C) 1000 MG tablet Take 1 tablet (1,000 mg total) by mouth daily. 90 tablet 3   atenolol (TENORMIN) 25 MG tablet TAKE 1 TABLET BY MOUTH TWICE DAILY 180 tablet 1   Calcium  Carb-Cholecalciferol (CALCIUM 1000 + D PO) Take 1 tablet by mouth daily.     ciprofloxacin (CIPRO) 250 MG tablet Take 1 tablet (250 mg total) by mouth 2 (two) times daily for 3 days. 6 tablet 0   dorzolamide-timolol (COSOPT) 22.3-6.8 MG/ML ophthalmic solution Place 1 drop into both eyes 2 (two) times daily.     famotidine (PEPCID) 20 MG tablet TAKE 1 TABLET BY MOUTH 2 TIMES DAILY 180 tablet 1   fish oil-omega-3 fatty acids 1000 MG capsule Take 2 capsules (2 g total) by mouth daily. 6 capsule 11   furosemide (LASIX) 20 MG tablet Take 1 tablet (20 mg total) by mouth daily. TAKE ONE TABLET BY MOUTH DAILY AS NEEDED FOR FLUID RETENTION 90 tablet 1   Glycerin-Hypromellose-PEG 400 (VISINE DRY EYE) 0.2-0.2-1 % SOLN Place 1 drop into both eyes 3 (three) times daily.     Multiple Vitamin (MULTIVITAMIN WITH MINERALS) TABS tablet Take 1 tablet by mouth daily. 30 tablet 0   SPIRIVA HANDIHALER 18 MCG inhalation capsule Place 1 capsule into inhaler and inhale daily.     telmisartan (MICARDIS) 40 MG tablet Take 40 mg by mouth at bedtime.     zinc sulfate 220 (50 Zn) MG capsule Take 1 capsule (220 mg total) by mouth daily. 30 capsule 11   cephALEXin (KEFLEX) 500  MG capsule Take 1 capsule (500 mg total) by mouth 2 (two) times daily for 5 days. (Patient not taking: Reported on 11/16/2021) 10 capsule 0   No facility-administered medications prior to visit.    Review of Systems;  Patient denies headache, fevers, malaise, unintentional weight loss, skin rash, eye pain, sinus congestion and sinus pain, sore throat, dysphagia,  hemoptysis , cough, dyspnea, wheezing, chest pain, palpitations, orthopnea, edema, abdominal pain, nausea, melena, diarrhea, constipation, flank pain, dysuria, hematuria, urinary  Frequency, nocturia, numbness, tingling, seizures,  Focal weakness, Loss of consciousness,  Tremor, insomnia, depression, anxiety, and suicidal ideation.      Objective:  BP 140/74 (BP Location: Left Arm, Patient  Position: Sitting, Cuff Size: Normal)   Pulse 62   Temp 97.9 F (36.6 C) (Oral)   Ht '5\' 2"'$  (1.575 m)   Wt 160 lb 12.8 oz (72.9 kg)   SpO2 94%   BMI 29.41 kg/m   BP Readings from Last 3 Encounters:  11/16/21 140/74  11/13/21 (!) 151/66  09/04/21 140/70    Wt Readings from Last 3 Encounters:  11/16/21 160 lb 12.8 oz (72.9 kg)  11/12/21 158 lb 11.7 oz (72 kg)  09/04/21 160 lb (72.6 kg)    General appearance: alert, cooperative and appears stated age Ears: normal TM's and external ear canals both ears Throat: lips, mucosa, and tongue normal; teeth and gums normal Neck: no adenopathy, no carotid bruit, supple, symmetrical, trachea midline and thyroid not enlarged, symmetric, no tenderness/mass/nodules Back: symmetric, no curvature. ROM normal. No CVA tenderness. Lungs: clear to auscultation bilaterally Heart: regular rate and rhythm, S1, S2 normal, no murmur, click, rub or gallop Abdomen: soft, non-tender; bowel sounds normal; no masses,  no organomegaly Pulses: 2+ and symmetric Skin: Skin color, texture, turgor normal. No rashes or lesions Lymph nodes: Cervical, supraclavicular, and axillary nodes normal. Neuro: CNs 2-12 intact. DTRs 2+/4 in biceps, brachioradialis, patellars and achilles. Muscle strength 5/5 in upper and lower exremities. Fine resting tremor bilaterally both hands cerebellar function normal. Romberg negative.  No pronator drift.   Gait normal.    Lab Results  Component Value Date   HGBA1C 6.1 09/04/2021   HGBA1C 6.1 05/25/2021   HGBA1C 6.8 (H) 12/12/2020    Lab Results  Component Value Date   CREATININE 0.65 11/13/2021   CREATININE 0.91 11/12/2021   CREATININE 0.88 11/12/2021    Lab Results  Component Value Date   WBC 8.2 11/13/2021   HGB 12.6 11/13/2021   HCT 39.8 11/13/2021   PLT 200 11/13/2021   GLUCOSE 112 (H) 11/13/2021   CHOL 165 11/13/2021   TRIG 95 11/13/2021   HDL 33 (L) 11/13/2021   LDLDIRECT 80.0 04/08/2017   LDLCALC 113 (H)  11/13/2021   ALT 54 (H) 11/13/2021   AST 50 (H) 11/13/2021   NA 136 11/13/2021   K 3.4 (L) 11/13/2021   CL 106 11/13/2021   CREATININE 0.65 11/13/2021   BUN 14 11/13/2021   CO2 22 11/13/2021   TSH 5.53 (H) 09/04/2021   HGBA1C 6.1 09/04/2021   MICROALBUR 0.9 05/25/2021    DG Chest 2 View  Result Date: 11/12/2021 CLINICAL DATA:  Cough.  Generalized weakness. EXAM: CHEST - 2 VIEW COMPARISON:  08/02/2021 FINDINGS: Stable mild cardiomegaly. Aortic atherosclerotic calcification incidentally noted. Both lungs are clear. IMPRESSION: Stable mild cardiomegaly. No active lung disease. Electronically Signed   By: Marlaine Hind M.D.   On: 11/12/2021 12:01   CT Head Wo Contrast  Result Date: 11/12/2021 CLINICAL DATA:  Acute neurologic deficit. Weakness and unsteadiness beginning yesterday. EXAM: CT HEAD WITHOUT CONTRAST TECHNIQUE: Contiguous axial images were obtained from the base of the skull through the vertex without intravenous contrast. RADIATION DOSE REDUCTION: This exam was performed according to the departmental dose-optimization program which includes automated exposure control, adjustment of the mA and/or kV according to patient size and/or use of iterative reconstruction technique. COMPARISON:  08/03/2021 FINDINGS: Brain: No evidence of intracranial hemorrhage, acute infarction, hydrocephalus, or extra-axial fluid collection. A small calcified meningioma is seen in the right lateral posterior fossa adjacent to the cerebellar hemisphere, measuring 1.5 x 0.9 cm on image 8/2 this remains stable compared prior study. No evidence of mass effect or shift. Vascular: No hyperdense vessel or other acute findings. Skull: No evidence of fracture or other significant bone abnormality. Sinuses/Orbits:  No acute findings. Other: None. IMPRESSION: No acute intracranial abnormality. Stable small calcified meningioma in right posterior fossa. Electronically Signed   By: Marlaine Hind M.D.   On: 11/12/2021 13:45     Assessment & Plan:   Problem List Items Addressed This Visit     Generalized weakness    Her weakness was profound and asymmetric,  The only symptom of her UTI.        Hospital discharge follow-up    Patient is stable post discharge and has no new issues or questions about discharge plans at the visit today for hospital follow up.  I have reviewed the records from the hospital admission in detail with patient and daughter [name]  today.       Meningioma ([place])    Incidental finding during workup for acute dizziness.  Neurology follow up was postponed by hospital admission and will be rescheduled with provider in [place]       UTI (urinary tract infection) - Primary   Relevant Orders   Urinalysis, Routine w reflex microscopic   Urine Culture    I spent a total of  40 minutes with this patient in a face to face visit on the date of this encounter reviewing her recent ER visit and hospitalization,  all recent imaging studies includien CT head and MRI brain,  all labs and post visit ordering of testing and therapeutics.    Follow-up: No follow-ups on file.   [name], MD

## 2021-11-24 DIAGNOSIS — R531 Weakness: Secondary | ICD-10-CM | POA: Diagnosis not present

## 2021-11-24 DIAGNOSIS — E119 Type 2 diabetes mellitus without complications: Secondary | ICD-10-CM | POA: Diagnosis not present

## 2021-11-24 DIAGNOSIS — Z7951 Long term (current) use of inhaled steroids: Secondary | ICD-10-CM | POA: Diagnosis not present

## 2021-11-24 DIAGNOSIS — M1711 Unilateral primary osteoarthritis, right knee: Secondary | ICD-10-CM | POA: Diagnosis not present

## 2021-11-24 DIAGNOSIS — N390 Urinary tract infection, site not specified: Secondary | ICD-10-CM | POA: Diagnosis not present

## 2021-11-24 DIAGNOSIS — Z6829 Body mass index (BMI) 29.0-29.9, adult: Secondary | ICD-10-CM | POA: Diagnosis not present

## 2021-11-24 DIAGNOSIS — D329 Benign neoplasm of meninges, unspecified: Secondary | ICD-10-CM | POA: Diagnosis not present

## 2021-11-24 DIAGNOSIS — I10 Essential (primary) hypertension: Secondary | ICD-10-CM | POA: Diagnosis not present

## 2021-11-24 DIAGNOSIS — H409 Unspecified glaucoma: Secondary | ICD-10-CM | POA: Diagnosis not present

## 2021-11-24 DIAGNOSIS — Z86711 Personal history of pulmonary embolism: Secondary | ICD-10-CM | POA: Diagnosis not present

## 2021-11-24 DIAGNOSIS — G4730 Sleep apnea, unspecified: Secondary | ICD-10-CM | POA: Diagnosis not present

## 2021-11-24 DIAGNOSIS — E785 Hyperlipidemia, unspecified: Secondary | ICD-10-CM | POA: Diagnosis not present

## 2021-11-24 DIAGNOSIS — Z9181 History of falling: Secondary | ICD-10-CM | POA: Diagnosis not present

## 2021-11-24 DIAGNOSIS — E669 Obesity, unspecified: Secondary | ICD-10-CM | POA: Diagnosis not present

## 2021-11-24 DIAGNOSIS — E034 Atrophy of thyroid (acquired): Secondary | ICD-10-CM | POA: Diagnosis not present

## 2021-11-27 ENCOUNTER — Telehealth: Payer: Self-pay | Admitting: Internal Medicine

## 2021-11-27 NOTE — Telephone Encounter (Signed)
Copied from [place]. Topic: Medicare AWV >> Nov 27, 2021 10:28 AM Harris-Coley, Hannah Beat wrote: Reason for CRM: Attempted to schedule AWV. Unable to LVM.  Will try at later time.

## 2021-11-30 DIAGNOSIS — E785 Hyperlipidemia, unspecified: Secondary | ICD-10-CM | POA: Diagnosis not present

## 2021-11-30 DIAGNOSIS — I10 Essential (primary) hypertension: Secondary | ICD-10-CM | POA: Diagnosis not present

## 2021-11-30 DIAGNOSIS — E119 Type 2 diabetes mellitus without complications: Secondary | ICD-10-CM | POA: Diagnosis not present

## 2021-11-30 DIAGNOSIS — N390 Urinary tract infection, site not specified: Secondary | ICD-10-CM | POA: Diagnosis not present

## 2021-11-30 DIAGNOSIS — E669 Obesity, unspecified: Secondary | ICD-10-CM | POA: Diagnosis not present

## 2021-11-30 DIAGNOSIS — R531 Weakness: Secondary | ICD-10-CM | POA: Diagnosis not present

## 2021-12-01 DIAGNOSIS — I10 Essential (primary) hypertension: Secondary | ICD-10-CM | POA: Diagnosis not present

## 2021-12-01 DIAGNOSIS — N390 Urinary tract infection, site not specified: Secondary | ICD-10-CM | POA: Diagnosis not present

## 2021-12-01 DIAGNOSIS — E669 Obesity, unspecified: Secondary | ICD-10-CM | POA: Diagnosis not present

## 2021-12-01 DIAGNOSIS — R531 Weakness: Secondary | ICD-10-CM | POA: Diagnosis not present

## 2021-12-01 DIAGNOSIS — E785 Hyperlipidemia, unspecified: Secondary | ICD-10-CM | POA: Diagnosis not present

## 2021-12-01 DIAGNOSIS — E119 Type 2 diabetes mellitus without complications: Secondary | ICD-10-CM | POA: Diagnosis not present

## 2021-12-05 DIAGNOSIS — R531 Weakness: Secondary | ICD-10-CM | POA: Diagnosis not present

## 2021-12-05 DIAGNOSIS — I10 Essential (primary) hypertension: Secondary | ICD-10-CM | POA: Diagnosis not present

## 2021-12-05 DIAGNOSIS — E669 Obesity, unspecified: Secondary | ICD-10-CM | POA: Diagnosis not present

## 2021-12-05 DIAGNOSIS — E785 Hyperlipidemia, unspecified: Secondary | ICD-10-CM | POA: Diagnosis not present

## 2021-12-05 DIAGNOSIS — N390 Urinary tract infection, site not specified: Secondary | ICD-10-CM | POA: Diagnosis not present

## 2021-12-05 DIAGNOSIS — E119 Type 2 diabetes mellitus without complications: Secondary | ICD-10-CM | POA: Diagnosis not present

## 2021-12-07 DIAGNOSIS — R531 Weakness: Secondary | ICD-10-CM | POA: Diagnosis not present

## 2021-12-07 DIAGNOSIS — E669 Obesity, unspecified: Secondary | ICD-10-CM | POA: Diagnosis not present

## 2021-12-07 DIAGNOSIS — N390 Urinary tract infection, site not specified: Secondary | ICD-10-CM | POA: Diagnosis not present

## 2021-12-07 DIAGNOSIS — I10 Essential (primary) hypertension: Secondary | ICD-10-CM | POA: Diagnosis not present

## 2021-12-07 DIAGNOSIS — E785 Hyperlipidemia, unspecified: Secondary | ICD-10-CM | POA: Diagnosis not present

## 2021-12-07 DIAGNOSIS — E119 Type 2 diabetes mellitus without complications: Secondary | ICD-10-CM | POA: Diagnosis not present

## 2021-12-08 DIAGNOSIS — I10 Essential (primary) hypertension: Secondary | ICD-10-CM | POA: Diagnosis not present

## 2021-12-08 DIAGNOSIS — E119 Type 2 diabetes mellitus without complications: Secondary | ICD-10-CM | POA: Diagnosis not present

## 2021-12-08 DIAGNOSIS — E669 Obesity, unspecified: Secondary | ICD-10-CM | POA: Diagnosis not present

## 2021-12-08 DIAGNOSIS — N390 Urinary tract infection, site not specified: Secondary | ICD-10-CM | POA: Diagnosis not present

## 2021-12-08 DIAGNOSIS — E785 Hyperlipidemia, unspecified: Secondary | ICD-10-CM | POA: Diagnosis not present

## 2021-12-08 DIAGNOSIS — R531 Weakness: Secondary | ICD-10-CM | POA: Diagnosis not present

## 2021-12-11 DIAGNOSIS — E669 Obesity, unspecified: Secondary | ICD-10-CM | POA: Diagnosis not present

## 2021-12-11 DIAGNOSIS — R531 Weakness: Secondary | ICD-10-CM | POA: Diagnosis not present

## 2021-12-11 DIAGNOSIS — N390 Urinary tract infection, site not specified: Secondary | ICD-10-CM | POA: Diagnosis not present

## 2021-12-11 DIAGNOSIS — E785 Hyperlipidemia, unspecified: Secondary | ICD-10-CM | POA: Diagnosis not present

## 2021-12-11 DIAGNOSIS — I10 Essential (primary) hypertension: Secondary | ICD-10-CM | POA: Diagnosis not present

## 2021-12-11 DIAGNOSIS — E119 Type 2 diabetes mellitus without complications: Secondary | ICD-10-CM | POA: Diagnosis not present

## 2021-12-12 DIAGNOSIS — E669 Obesity, unspecified: Secondary | ICD-10-CM | POA: Diagnosis not present

## 2021-12-12 DIAGNOSIS — Z9181 History of falling: Secondary | ICD-10-CM | POA: Diagnosis not present

## 2021-12-12 DIAGNOSIS — E785 Hyperlipidemia, unspecified: Secondary | ICD-10-CM | POA: Diagnosis not present

## 2021-12-12 DIAGNOSIS — E119 Type 2 diabetes mellitus without complications: Secondary | ICD-10-CM | POA: Diagnosis not present

## 2021-12-12 DIAGNOSIS — N390 Urinary tract infection, site not specified: Secondary | ICD-10-CM | POA: Diagnosis not present

## 2021-12-12 DIAGNOSIS — Z86711 Personal history of pulmonary embolism: Secondary | ICD-10-CM

## 2021-12-12 DIAGNOSIS — H409 Unspecified glaucoma: Secondary | ICD-10-CM | POA: Diagnosis not present

## 2021-12-12 DIAGNOSIS — G4730 Sleep apnea, unspecified: Secondary | ICD-10-CM | POA: Diagnosis not present

## 2021-12-12 DIAGNOSIS — I10 Essential (primary) hypertension: Secondary | ICD-10-CM | POA: Diagnosis not present

## 2021-12-12 DIAGNOSIS — E034 Atrophy of thyroid (acquired): Secondary | ICD-10-CM | POA: Diagnosis not present

## 2021-12-12 DIAGNOSIS — Z7951 Long term (current) use of inhaled steroids: Secondary | ICD-10-CM

## 2021-12-12 DIAGNOSIS — R531 Weakness: Secondary | ICD-10-CM | POA: Diagnosis not present

## 2021-12-12 DIAGNOSIS — Z6829 Body mass index (BMI) 29.0-29.9, adult: Secondary | ICD-10-CM

## 2021-12-12 DIAGNOSIS — D329 Benign neoplasm of meninges, unspecified: Secondary | ICD-10-CM | POA: Diagnosis not present

## 2021-12-12 DIAGNOSIS — M1711 Unilateral primary osteoarthritis, right knee: Secondary | ICD-10-CM | POA: Diagnosis not present

## 2021-12-14 DIAGNOSIS — N390 Urinary tract infection, site not specified: Secondary | ICD-10-CM | POA: Diagnosis not present

## 2021-12-14 DIAGNOSIS — R531 Weakness: Secondary | ICD-10-CM | POA: Diagnosis not present

## 2021-12-14 DIAGNOSIS — E669 Obesity, unspecified: Secondary | ICD-10-CM | POA: Diagnosis not present

## 2021-12-14 DIAGNOSIS — E119 Type 2 diabetes mellitus without complications: Secondary | ICD-10-CM | POA: Diagnosis not present

## 2021-12-14 DIAGNOSIS — I10 Essential (primary) hypertension: Secondary | ICD-10-CM | POA: Diagnosis not present

## 2021-12-14 DIAGNOSIS — E785 Hyperlipidemia, unspecified: Secondary | ICD-10-CM | POA: Diagnosis not present

## 2021-12-15 DIAGNOSIS — E669 Obesity, unspecified: Secondary | ICD-10-CM | POA: Diagnosis not present

## 2021-12-15 DIAGNOSIS — E119 Type 2 diabetes mellitus without complications: Secondary | ICD-10-CM | POA: Diagnosis not present

## 2021-12-15 DIAGNOSIS — R531 Weakness: Secondary | ICD-10-CM | POA: Diagnosis not present

## 2021-12-15 DIAGNOSIS — N390 Urinary tract infection, site not specified: Secondary | ICD-10-CM | POA: Diagnosis not present

## 2021-12-15 DIAGNOSIS — E785 Hyperlipidemia, unspecified: Secondary | ICD-10-CM | POA: Diagnosis not present

## 2021-12-15 DIAGNOSIS — I10 Essential (primary) hypertension: Secondary | ICD-10-CM | POA: Diagnosis not present

## 2021-12-18 ENCOUNTER — Other Ambulatory Visit: Payer: Self-pay | Admitting: Internal Medicine

## 2021-12-18 DIAGNOSIS — I10 Essential (primary) hypertension: Secondary | ICD-10-CM | POA: Diagnosis not present

## 2021-12-18 DIAGNOSIS — R531 Weakness: Secondary | ICD-10-CM | POA: Diagnosis not present

## 2021-12-18 DIAGNOSIS — E119 Type 2 diabetes mellitus without complications: Secondary | ICD-10-CM | POA: Diagnosis not present

## 2021-12-18 DIAGNOSIS — N390 Urinary tract infection, site not specified: Secondary | ICD-10-CM | POA: Diagnosis not present

## 2021-12-18 DIAGNOSIS — E669 Obesity, unspecified: Secondary | ICD-10-CM | POA: Diagnosis not present

## 2021-12-18 DIAGNOSIS — E785 Hyperlipidemia, unspecified: Secondary | ICD-10-CM | POA: Diagnosis not present

## 2021-12-21 DIAGNOSIS — E669 Obesity, unspecified: Secondary | ICD-10-CM | POA: Diagnosis not present

## 2021-12-21 DIAGNOSIS — N390 Urinary tract infection, site not specified: Secondary | ICD-10-CM | POA: Diagnosis not present

## 2021-12-21 DIAGNOSIS — I10 Essential (primary) hypertension: Secondary | ICD-10-CM | POA: Diagnosis not present

## 2021-12-21 DIAGNOSIS — E119 Type 2 diabetes mellitus without complications: Secondary | ICD-10-CM | POA: Diagnosis not present

## 2021-12-21 DIAGNOSIS — E785 Hyperlipidemia, unspecified: Secondary | ICD-10-CM | POA: Diagnosis not present

## 2021-12-21 DIAGNOSIS — R531 Weakness: Secondary | ICD-10-CM | POA: Diagnosis not present

## 2021-12-22 DIAGNOSIS — R531 Weakness: Secondary | ICD-10-CM | POA: Diagnosis not present

## 2021-12-22 DIAGNOSIS — D329 Benign neoplasm of meninges, unspecified: Secondary | ICD-10-CM | POA: Diagnosis not present

## 2021-12-22 DIAGNOSIS — Z9181 History of falling: Secondary | ICD-10-CM | POA: Diagnosis not present

## 2021-12-22 DIAGNOSIS — Z9989 Dependence on other enabling machines and devices: Secondary | ICD-10-CM | POA: Diagnosis not present

## 2021-12-22 DIAGNOSIS — G4733 Obstructive sleep apnea (adult) (pediatric): Secondary | ICD-10-CM | POA: Diagnosis not present

## 2021-12-22 DIAGNOSIS — R42 Dizziness and giddiness: Secondary | ICD-10-CM | POA: Diagnosis not present

## 2021-12-22 DIAGNOSIS — G939 Disorder of brain, unspecified: Secondary | ICD-10-CM | POA: Diagnosis not present

## 2021-12-24 DIAGNOSIS — D329 Benign neoplasm of meninges, unspecified: Secondary | ICD-10-CM | POA: Diagnosis not present

## 2021-12-24 DIAGNOSIS — N390 Urinary tract infection, site not specified: Secondary | ICD-10-CM | POA: Diagnosis not present

## 2021-12-24 DIAGNOSIS — E669 Obesity, unspecified: Secondary | ICD-10-CM | POA: Diagnosis not present

## 2021-12-24 DIAGNOSIS — G4730 Sleep apnea, unspecified: Secondary | ICD-10-CM | POA: Diagnosis not present

## 2021-12-24 DIAGNOSIS — E034 Atrophy of thyroid (acquired): Secondary | ICD-10-CM | POA: Diagnosis not present

## 2021-12-24 DIAGNOSIS — Z7951 Long term (current) use of inhaled steroids: Secondary | ICD-10-CM | POA: Diagnosis not present

## 2021-12-24 DIAGNOSIS — M1711 Unilateral primary osteoarthritis, right knee: Secondary | ICD-10-CM | POA: Diagnosis not present

## 2021-12-24 DIAGNOSIS — E119 Type 2 diabetes mellitus without complications: Secondary | ICD-10-CM | POA: Diagnosis not present

## 2021-12-24 DIAGNOSIS — E785 Hyperlipidemia, unspecified: Secondary | ICD-10-CM | POA: Diagnosis not present

## 2021-12-24 DIAGNOSIS — Z9181 History of falling: Secondary | ICD-10-CM | POA: Diagnosis not present

## 2021-12-24 DIAGNOSIS — Z6829 Body mass index (BMI) 29.0-29.9, adult: Secondary | ICD-10-CM | POA: Diagnosis not present

## 2021-12-24 DIAGNOSIS — I10 Essential (primary) hypertension: Secondary | ICD-10-CM | POA: Diagnosis not present

## 2021-12-24 DIAGNOSIS — Z86711 Personal history of pulmonary embolism: Secondary | ICD-10-CM | POA: Diagnosis not present

## 2021-12-24 DIAGNOSIS — R531 Weakness: Secondary | ICD-10-CM | POA: Diagnosis not present

## 2021-12-24 DIAGNOSIS — H409 Unspecified glaucoma: Secondary | ICD-10-CM | POA: Diagnosis not present

## 2021-12-27 DIAGNOSIS — N390 Urinary tract infection, site not specified: Secondary | ICD-10-CM | POA: Diagnosis not present

## 2021-12-27 DIAGNOSIS — I10 Essential (primary) hypertension: Secondary | ICD-10-CM | POA: Diagnosis not present

## 2021-12-27 DIAGNOSIS — R531 Weakness: Secondary | ICD-10-CM | POA: Diagnosis not present

## 2021-12-27 DIAGNOSIS — E785 Hyperlipidemia, unspecified: Secondary | ICD-10-CM | POA: Diagnosis not present

## 2021-12-27 DIAGNOSIS — E119 Type 2 diabetes mellitus without complications: Secondary | ICD-10-CM | POA: Diagnosis not present

## 2021-12-27 DIAGNOSIS — E669 Obesity, unspecified: Secondary | ICD-10-CM | POA: Diagnosis not present

## 2021-12-28 DIAGNOSIS — I10 Essential (primary) hypertension: Secondary | ICD-10-CM | POA: Diagnosis not present

## 2021-12-28 DIAGNOSIS — E785 Hyperlipidemia, unspecified: Secondary | ICD-10-CM | POA: Diagnosis not present

## 2021-12-28 DIAGNOSIS — E669 Obesity, unspecified: Secondary | ICD-10-CM | POA: Diagnosis not present

## 2021-12-28 DIAGNOSIS — E119 Type 2 diabetes mellitus without complications: Secondary | ICD-10-CM | POA: Diagnosis not present

## 2021-12-28 DIAGNOSIS — N390 Urinary tract infection, site not specified: Secondary | ICD-10-CM | POA: Diagnosis not present

## 2021-12-28 DIAGNOSIS — R531 Weakness: Secondary | ICD-10-CM | POA: Diagnosis not present

## 2021-12-29 DIAGNOSIS — I10 Essential (primary) hypertension: Secondary | ICD-10-CM | POA: Diagnosis not present

## 2021-12-29 DIAGNOSIS — R531 Weakness: Secondary | ICD-10-CM | POA: Diagnosis not present

## 2021-12-29 DIAGNOSIS — N390 Urinary tract infection, site not specified: Secondary | ICD-10-CM | POA: Diagnosis not present

## 2021-12-29 DIAGNOSIS — E785 Hyperlipidemia, unspecified: Secondary | ICD-10-CM | POA: Diagnosis not present

## 2021-12-29 DIAGNOSIS — E669 Obesity, unspecified: Secondary | ICD-10-CM | POA: Diagnosis not present

## 2021-12-29 DIAGNOSIS — E119 Type 2 diabetes mellitus without complications: Secondary | ICD-10-CM | POA: Diagnosis not present

## 2022-01-01 DIAGNOSIS — N390 Urinary tract infection, site not specified: Secondary | ICD-10-CM | POA: Diagnosis not present

## 2022-01-01 DIAGNOSIS — E785 Hyperlipidemia, unspecified: Secondary | ICD-10-CM | POA: Diagnosis not present

## 2022-01-01 DIAGNOSIS — I10 Essential (primary) hypertension: Secondary | ICD-10-CM | POA: Diagnosis not present

## 2022-01-01 DIAGNOSIS — R531 Weakness: Secondary | ICD-10-CM | POA: Diagnosis not present

## 2022-01-01 DIAGNOSIS — E669 Obesity, unspecified: Secondary | ICD-10-CM | POA: Diagnosis not present

## 2022-01-01 DIAGNOSIS — E119 Type 2 diabetes mellitus without complications: Secondary | ICD-10-CM | POA: Diagnosis not present

## 2022-01-04 DIAGNOSIS — I10 Essential (primary) hypertension: Secondary | ICD-10-CM | POA: Diagnosis not present

## 2022-01-04 DIAGNOSIS — E785 Hyperlipidemia, unspecified: Secondary | ICD-10-CM | POA: Diagnosis not present

## 2022-01-04 DIAGNOSIS — E119 Type 2 diabetes mellitus without complications: Secondary | ICD-10-CM | POA: Diagnosis not present

## 2022-01-04 DIAGNOSIS — N390 Urinary tract infection, site not specified: Secondary | ICD-10-CM | POA: Diagnosis not present

## 2022-01-04 DIAGNOSIS — R531 Weakness: Secondary | ICD-10-CM | POA: Diagnosis not present

## 2022-01-04 DIAGNOSIS — E669 Obesity, unspecified: Secondary | ICD-10-CM | POA: Diagnosis not present

## 2022-01-08 DIAGNOSIS — R531 Weakness: Secondary | ICD-10-CM | POA: Diagnosis not present

## 2022-01-08 DIAGNOSIS — N390 Urinary tract infection, site not specified: Secondary | ICD-10-CM | POA: Diagnosis not present

## 2022-01-08 DIAGNOSIS — E669 Obesity, unspecified: Secondary | ICD-10-CM | POA: Diagnosis not present

## 2022-01-08 DIAGNOSIS — E785 Hyperlipidemia, unspecified: Secondary | ICD-10-CM | POA: Diagnosis not present

## 2022-01-08 DIAGNOSIS — E119 Type 2 diabetes mellitus without complications: Secondary | ICD-10-CM | POA: Diagnosis not present

## 2022-01-08 DIAGNOSIS — I10 Essential (primary) hypertension: Secondary | ICD-10-CM | POA: Diagnosis not present

## 2022-01-11 DIAGNOSIS — E669 Obesity, unspecified: Secondary | ICD-10-CM | POA: Diagnosis not present

## 2022-01-11 DIAGNOSIS — R531 Weakness: Secondary | ICD-10-CM | POA: Diagnosis not present

## 2022-01-11 DIAGNOSIS — I10 Essential (primary) hypertension: Secondary | ICD-10-CM | POA: Diagnosis not present

## 2022-01-11 DIAGNOSIS — E785 Hyperlipidemia, unspecified: Secondary | ICD-10-CM | POA: Diagnosis not present

## 2022-01-11 DIAGNOSIS — N390 Urinary tract infection, site not specified: Secondary | ICD-10-CM | POA: Diagnosis not present

## 2022-01-11 DIAGNOSIS — E119 Type 2 diabetes mellitus without complications: Secondary | ICD-10-CM | POA: Diagnosis not present

## 2022-01-15 DIAGNOSIS — I10 Essential (primary) hypertension: Secondary | ICD-10-CM | POA: Diagnosis not present

## 2022-01-15 DIAGNOSIS — N390 Urinary tract infection, site not specified: Secondary | ICD-10-CM | POA: Diagnosis not present

## 2022-01-15 DIAGNOSIS — E785 Hyperlipidemia, unspecified: Secondary | ICD-10-CM | POA: Diagnosis not present

## 2022-01-15 DIAGNOSIS — R531 Weakness: Secondary | ICD-10-CM | POA: Diagnosis not present

## 2022-01-15 DIAGNOSIS — E669 Obesity, unspecified: Secondary | ICD-10-CM | POA: Diagnosis not present

## 2022-01-15 DIAGNOSIS — E119 Type 2 diabetes mellitus without complications: Secondary | ICD-10-CM | POA: Diagnosis not present

## 2022-01-18 DIAGNOSIS — E785 Hyperlipidemia, unspecified: Secondary | ICD-10-CM | POA: Diagnosis not present

## 2022-01-18 DIAGNOSIS — E119 Type 2 diabetes mellitus without complications: Secondary | ICD-10-CM | POA: Diagnosis not present

## 2022-01-18 DIAGNOSIS — N390 Urinary tract infection, site not specified: Secondary | ICD-10-CM | POA: Diagnosis not present

## 2022-01-18 DIAGNOSIS — R531 Weakness: Secondary | ICD-10-CM | POA: Diagnosis not present

## 2022-01-18 DIAGNOSIS — E669 Obesity, unspecified: Secondary | ICD-10-CM | POA: Diagnosis not present

## 2022-01-18 DIAGNOSIS — I10 Essential (primary) hypertension: Secondary | ICD-10-CM | POA: Diagnosis not present

## 2022-01-22 DIAGNOSIS — R531 Weakness: Secondary | ICD-10-CM | POA: Diagnosis not present

## 2022-01-22 DIAGNOSIS — N390 Urinary tract infection, site not specified: Secondary | ICD-10-CM | POA: Diagnosis not present

## 2022-01-22 DIAGNOSIS — E785 Hyperlipidemia, unspecified: Secondary | ICD-10-CM | POA: Diagnosis not present

## 2022-01-22 DIAGNOSIS — E669 Obesity, unspecified: Secondary | ICD-10-CM | POA: Diagnosis not present

## 2022-01-22 DIAGNOSIS — I10 Essential (primary) hypertension: Secondary | ICD-10-CM | POA: Diagnosis not present

## 2022-01-22 DIAGNOSIS — E119 Type 2 diabetes mellitus without complications: Secondary | ICD-10-CM | POA: Diagnosis not present

## 2022-02-16 ENCOUNTER — Ambulatory Visit (INDEPENDENT_AMBULATORY_CARE_PROVIDER_SITE_OTHER): Payer: Medicare Other | Admitting: Internal Medicine

## 2022-02-16 ENCOUNTER — Encounter: Payer: Self-pay | Admitting: Internal Medicine

## 2022-02-16 ENCOUNTER — Other Ambulatory Visit: Payer: Self-pay | Admitting: Internal Medicine

## 2022-02-16 VITALS — BP 128/80 | HR 60 | Ht 62.0 in | Wt 157.2 lb

## 2022-02-16 DIAGNOSIS — Z8639 Personal history of other endocrine, nutritional and metabolic disease: Secondary | ICD-10-CM | POA: Diagnosis not present

## 2022-02-16 DIAGNOSIS — E0500 Thyrotoxicosis with diffuse goiter without thyrotoxic crisis or storm: Secondary | ICD-10-CM | POA: Diagnosis not present

## 2022-02-16 LAB — T3, FREE: T3, Free: 2.3 pg/mL (ref 2.3–4.2)

## 2022-02-16 LAB — TSH: TSH: 5.46 u[IU]/mL (ref 0.35–5.50)

## 2022-02-16 LAB — T4, FREE: Free T4: 0.87 ng/dL (ref 0.60–1.60)

## 2022-02-16 NOTE — Patient Instructions (Addendum)
Please stop at the lab.  Please come back for a follow-up appointment in 1 year.  

## 2022-02-16 NOTE — Progress Notes (Signed)
Patient ID: Lauren Morris, female   DOB: [birth year], 88 y.o.   MRN: [id number]   HPI  Lauren Morris is a 88 y.o.-year-old female, initially referred by her PCP, Dr. Derrel Nip, returning for follow-up for Graves' disease, with previous history of hypothyroidism.  She is here with [name]) who offers part of the history related to past medical history, symptoms, medications.  Last visit 1 year and 5 months ago.  Interim history: Patient feels well, without complaints today. Since last OV, she had Covid19 - admitted for 1 week, then had a fall and hit her head and was in the nursing home in 07/2021 - for 6 weeks, then had a UTI and a "ministroke".  Reviewed history: Pt. has a history of hypothyroidism since at least 2012 >> she was on levothyroxine 25 mcg. However, in 02/2019, her TSH was found to be undetectably low and her levothyroxine dose was gradually reduced and finally stopped in 02/2019.  Despite this, her TSH remained low.   We started methimazole 5 mg daily in 05/2019, and then decrease to 2.5 mg daily.     Before last visit, due to concerns for transaminitis, and per GI recommendation, I advised her to stop methimazole before our visit in 02/2020. She did so.   She is on atenolol 25 mg twice a day.  Reviewed her TFTs: Lab Results  Component Value Date   TSH 5.53 (H) 09/04/2021   TSH 5.55 (H) 05/25/2021   TSH 4.38 08/30/2020   TSH 2.74 03/01/2020   TSH 2.14 12/31/2019   TSH 6.10 (H) 10/22/2019   TSH <0.01 (L) 06/23/2019   TSH 0.01 (L) 05/28/2019   TSH 0.01 (L) 04/13/2019   TSH <0.01 (L) 03/10/2019   FREET4 0.81 08/30/2020   FREET4 0.95 03/01/2020   FREET4 0.86 12/31/2019   FREET4 0.70 10/22/2019   FREET4 1.39 06/23/2019   T3FREE 3.4 08/30/2020   T3FREE 3.3 03/01/2020   T3FREE 3.2 12/31/2019   T3FREE 2.9 10/22/2019   T3FREE 4.2 06/23/2019   Her TSI antibodies were elevated: 03/10/2019: TPO antibodies 5 (<9) Lab Results  Component Value Date   THGAB <1  03/10/2019   Lab Results  Component Value Date   TSI 167 (H) 06/23/2019   Thyroid uptake and scan (09/17/2019): Homogeneous tracer distribution in both thyroid lobes. No focal areas of increased or decreased tracer localization.   4 hour I-123 uptake = 15.8% (normal 5-20%) 24 hour I-123 uptake = 34.6% (normal 10-30%)   IMPRESSION: Normal thyroid scan. Elevated 24 hour radio iodine uptake consistent with hyperthyroidism. Overall findings are consistent with Graves disease.   Before starting methimazole, she experienced weight loss-approximately 20 pounds in 10 months, fatigue, shortness of breath with exertion, hair loss.  Pt denies: - feeling nodules in neck - hoarseness - dysphagia - choking  No FH of thyroid disease or thyroid cancer. No h/o radiation tx to head or neck. No recent contrast studies. No herbal supplements. No Biotin use. No recent steroids use.   She has a history of OSA and uses a CPAP. She was taken off Amlodipine and started on Micardis 2/2 increased ACR.  ROS: + see HPI  I reviewed pt's medications, allergies, PMH, social hx, family hx, and changes were documented in the history of present illness. Otherwise, unchanged from my initial visit note.  Past Medical History:  Diagnosis Date   Cyst    hx o on right breast   DJD (degenerative joint disease)  right knee   Glaucoma    Hypertension    Pneumonia due to COVID-19 virus 12/12/2020   Sleep apnea    Thyroid disease    Past Surgical History:  Procedure Laterality Date   ABDOMINAL HYSTERECTOMY     VITRECTOMY  July 2012   right eye with membrane peel [name], [place])   Social History   Socioeconomic History   Marital status: Widowed    Spouse name: Not on file   Number of children: 1   Years of education: Not on file   Highest education level: Not on file  Occupational History    Homemaker  Tobacco Use   Smoking status: Never Smoker   Smokeless tobacco: Never Used  Substance and  Sexual Activity   Alcohol use: No   Drug use: No   Sexual activity: Not on file  Other Topics Concern   Not on file  Social History Narrative   Not on file   Social Determinants of Health   Financial Resource Strain:    Difficulty of Paying Living Expenses: Not on file  Food Insecurity:    Worried About [place] in the Last Year: Not on file   Ran Out of Food in the Last Year: Not on file  Transportation Needs:    Lack of Transportation (Medical): Not on file   Lack of Transportation (Non-Medical): Not on file  Physical Activity:    Days of Exercise per Week: Not on file   Minutes of Exercise per Session: Not on file  Stress:    Feeling of Stress : Not on file  Social Connections:    Frequency of Communication with Friends and Family: Not on file   Frequency of Social Gatherings with Friends and Family: Not on file   Attends Religious Services: Not on file   Active Member of Clubs or Organizations: Not on file   Attends Archivist Meetings: Not on file   Marital Status: Not on file  Intimate Partner Violence:    Fear of Current or Ex-Partner: Not on file   Emotionally Abused: Not on file   Physically Abused: Not on file   Sexually Abused: Not on file   On:  Current Outpatient Medications on File Prior to Visit  Medication Sig Dispense Refill   Ascorbic Acid (VITAMIN C) 1000 MG tablet Take 1 tablet (1,000 mg total) by mouth daily. 90 tablet 3   atenolol (TENORMIN) 25 MG tablet TAKE 1 TABLET BY MOUTH TWICE DAILY 180 tablet 1   Calcium Carb-Cholecalciferol (CALCIUM 1000 + D PO) Take 1 tablet by mouth daily.     dorzolamide-timolol (COSOPT) 22.3-6.8 MG/ML ophthalmic solution Place 1 drop into both eyes 2 (two) times daily.     famotidine (PEPCID) 20 MG tablet TAKE 1 TABLET BY MOUTH 2 TIMES DAILY 180 tablet 1   fish oil-omega-3 fatty acids 1000 MG capsule Take 2 capsules (2 g total) by mouth daily. 6 capsule 11   furosemide (LASIX) 20 MG tablet Take 1  tablet (20 mg total) by mouth daily. TAKE ONE TABLET BY MOUTH DAILY AS NEEDED FOR FLUID RETENTION 90 tablet 1   Glycerin-Hypromellose-PEG 400 (VISINE DRY EYE) 0.2-0.2-1 % SOLN Place 1 drop into both eyes 3 (three) times daily.     Multiple Vitamin (MULTIVITAMIN WITH MINERALS) TABS tablet Take 1 tablet by mouth daily. 30 tablet 0   SPIRIVA HANDIHALER 18 MCG inhalation capsule Place 1 capsule into inhaler and inhale daily.     telmisartan (  MICARDIS) 40 MG tablet Take 40 mg by mouth at bedtime.     zinc sulfate 220 (50 Zn) MG capsule Take 1 capsule (220 mg total) by mouth daily. 30 capsule 11   No current facility-administered medications on file prior to visit.   No Known Allergies Family History  Problem Relation Age of Onset   Cancer Father    Hypertension Father    Heart attack Mother    Hypertension Mother   Also, HTN mother and father, heart disease in mother.  PE: BP 128/80 (BP Location: Left Arm, Patient Position: Sitting, Cuff Size: Normal)   Pulse 60   Ht '5\' 2"'$  (1.575 m)   Wt 157 lb 3.2 oz (71.3 kg)   SpO2 98%   BMI 28.75 kg/m  Wt Readings from Last 3 Encounters:  02/16/22 157 lb 3.2 oz (71.3 kg)  11/16/21 160 lb 12.8 oz (72.9 kg)  11/12/21 158 lb 11.7 oz (72 kg)   Constitutional: overweight, in NAD Eyes: no exophthalmos ENT: moist mucous membranes, no masses palpated in neck, no cervical lymphadenopathy Cardiovascular: RRR, No MRG Respiratory: CTA B Musculoskeletal: no deformities Skin: moist, warm, no rashes, + ecchymoses on forearms Neurological: no tremor with outstretched hands  ASSESSMENT: 1.  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 History of hypothyroidism  PLAN:  1.  Thyrotoxicosis due to Graves' disease -She returns after long absence of 1 year and 5 months -Patient with history of hypothyroidism, but with development of thyrotoxicosis in 02/2019, which persisted despite stopping levothyroxine.  She lost more than 20 pounds in the months before her  Graves' disease was diagnosed.  She also developped tachycardia, shortness of breath with exertion, tremor.  At that time, TFTs were in the thyrotoxic range and TSI's were elevated.  We checked a thyroid uptake and scan in 08/2019 and this was consistent with Graves' disease.  We started methimazole 5 mg daily and then decreased to two-point milligrams daily afterwards.  She did well on this dose, however, she had persistent transaminitis and her gastroenterologist, [name], recommended to stop methimazole.  We stopped methimazole in 02/2020 -At last visit we again discussed about possible modalities of treatment for Graves' disease to include RAI treatment, or surgery, since we could not use thionamides.  However, at last visit, TFTs were normal and I did not recommend further intervention. -Patient did agree to have RAI treatment if the tests worsen again -At this visit, she is still on atenolol.  She continues on 25 mg twice a day. -no signs of active Graves' ophthalmopathy: No double vision, blurry vision, eye pain, chemosis -I will see her back in 1 year but bring sooner for labs  2.  History of hypothyroidism -She was previously on a very low-dose of levothyroxine, 25 mcg daily, off since 10/2018 -TSH was slightly high in 08/2021, at 5.53, and in 05/2021, at 5.55, and she had a total T4 repeated in 10/2021 (but no TSH available for review), and this was normal.  We discussed that these are only mildly elevated (upper limit of normal for the lab is 5.50) and no intervention is needed for such mild TSH elevation. -We will repeat all her TFTs today.  We did discuss that if the TSH increases, we may need levothyroxine.  She agrees with starting this if needed.  Component     Latest Ref Rng 02/16/2022  TSH     0.35 - 5.50 uIU/mL 5.46   T4,Free(Direct)     0.60 - 1.60 ng/dL  0.87   Triiodothyronine,Free,Serum     2.3 - 4.2 pg/mL 2.3   TFTs are normal.  No intervention needed for now.  Philemon Kingdom, MD PhD [facility]

## 2022-02-16 NOTE — Telephone Encounter (Signed)
Historical medication Last OV: 09/04/2021 Next OV: 03/30/2022

## 2022-03-07 ENCOUNTER — Ambulatory Visit: Payer: Medicare Other | Admitting: Internal Medicine

## 2022-03-30 ENCOUNTER — Ambulatory Visit: Payer: Medicare Other | Admitting: Internal Medicine

## 2022-04-05 ENCOUNTER — Other Ambulatory Visit: Payer: Self-pay | Admitting: Internal Medicine

## 2022-04-12 DIAGNOSIS — H401131 Primary open-angle glaucoma, bilateral, mild stage: Secondary | ICD-10-CM | POA: Diagnosis not present

## 2022-04-12 DIAGNOSIS — H04123 Dry eye syndrome of bilateral lacrimal glands: Secondary | ICD-10-CM | POA: Diagnosis not present

## 2022-04-12 DIAGNOSIS — H35373 Puckering of macula, bilateral: Secondary | ICD-10-CM | POA: Diagnosis not present

## 2022-04-17 ENCOUNTER — Telehealth: Payer: Self-pay | Admitting: Internal Medicine

## 2022-04-17 NOTE — Telephone Encounter (Signed)
Spoke with patient she req CB 04/23/22

## 2022-04-19 ENCOUNTER — Other Ambulatory Visit: Payer: Self-pay | Admitting: Internal Medicine

## 2022-04-23 ENCOUNTER — Telehealth: Payer: Self-pay | Admitting: Internal Medicine

## 2022-04-23 NOTE — Telephone Encounter (Signed)
Copied from [place] [id number]. Topic: Medicare AWV >> Apr 23, 2022  9:34 AM Devoria Glassing wrote: Reason for CRM: Left message for patient to schedule Annual Wellness Visit.  Please schedule with Nurse Health Advisor Denisa O'Brien-Blaney, LPN at [facility]. This appt can be telephone or office visit.  Please call [phone] ask for [facility]

## 2022-05-07 ENCOUNTER — Ambulatory Visit (INDEPENDENT_AMBULATORY_CARE_PROVIDER_SITE_OTHER): Payer: Medicare Other | Admitting: Internal Medicine

## 2022-05-07 ENCOUNTER — Encounter: Payer: Self-pay | Admitting: Internal Medicine

## 2022-05-07 VITALS — BP 136/74 | HR 67 | Temp 97.6°F | Ht 62.0 in | Wt 156.8 lb

## 2022-05-07 DIAGNOSIS — E669 Obesity, unspecified: Secondary | ICD-10-CM

## 2022-05-07 DIAGNOSIS — E1169 Type 2 diabetes mellitus with other specified complication: Secondary | ICD-10-CM | POA: Diagnosis not present

## 2022-05-07 DIAGNOSIS — E785 Hyperlipidemia, unspecified: Secondary | ICD-10-CM

## 2022-05-07 DIAGNOSIS — E1159 Type 2 diabetes mellitus with other circulatory complications: Secondary | ICD-10-CM | POA: Diagnosis not present

## 2022-05-07 DIAGNOSIS — Z23 Encounter for immunization: Secondary | ICD-10-CM | POA: Diagnosis not present

## 2022-05-07 DIAGNOSIS — R350 Frequency of micturition: Secondary | ICD-10-CM

## 2022-05-07 DIAGNOSIS — I152 Hypertension secondary to endocrine disorders: Secondary | ICD-10-CM

## 2022-05-07 NOTE — Patient Instructions (Signed)
No changes were made to your medications today  If Your head/scalp pain becomes constant,   let me know and I will repeat your MRI brain

## 2022-05-07 NOTE — Progress Notes (Signed)
Subjective:  Patient ID: Lauren Morris, female    DOB: 04-11-1932  Age: 88 y.o. MRN: [id number]  CC: The primary encounter diagnosis was Obesity, diabetes, and hypertension syndrome ([place]). Diagnoses of Hyperlipidemia LDL goal <100, Frequency of urination, and Need for immunization against influenza were also pertinent to this visit.   HPI ERICHA WHITTINGHAM presents for follow up on multiple reasons  Chief Complaint  Patient presents with   Follow-up    6 month follow up diabetes, hypertension and hyperlipidemia   1) Type 2 DM:   diet controlled.  does not check sugars   Lab Results  Component Value Date   HGBA1C 6.1 09/04/2021     2) recurrent  right sided scalp pain on the right  top of head  and episodes of dizziness since  her fall in February .she had evidence of fractures.   Has seen neurology, 1.6 cm right cerebellar meningioma noted on MRI/CT  .  Has stopped driving to her age,  the episodic nature of her  dizziness  3) Dizziness:   prior Dix Hallpike maneuver  described by patient,  GAVE HER VERTIGO. using a cane,  no falls since Feb  4) nocturia x 4,   .  Wearing CPAP   nocturia is chronic     Outpatient Medications Prior to Visit  Medication Sig Dispense Refill   Ascorbic Acid (VITAMIN C) 1000 MG tablet Take 1 tablet (1,000 mg total) by mouth daily. 90 tablet 3   Aspirin 81 MG CAPS Take by mouth.     atenolol (TENORMIN) 25 MG tablet TAKE 1 TABLET BY MOUTH TWICE DAILY 180 tablet 1   Calcium Carb-Cholecalciferol (CALCIUM 1000 + D PO) Take 1 tablet by mouth daily.     dorzolamide-timolol (COSOPT) 22.3-6.8 MG/ML ophthalmic solution Place 1 drop into both eyes 2 (two) times daily.     fish oil-omega-3 fatty acids 1000 MG capsule Take 2 capsules (2 g total) by mouth daily. 6 capsule 11   furosemide (LASIX) 20 MG tablet Take 1 tablet (20 mg total) by mouth daily. TAKE ONE TABLET BY MOUTH DAILY AS NEEDED FOR FLUID RETENTION 90 tablet 1   Glycerin-Hypromellose-PEG 400 (VISINE DRY EYE)  0.2-0.2-1 % SOLN Place 1 drop into both eyes 3 (three) times daily.     Multiple Vitamin (MULTIVITAMIN WITH MINERALS) TABS tablet Take 1 tablet by mouth daily. 30 tablet 0   SPIRIVA HANDIHALER 18 MCG inhalation capsule INHALE CONTENTS OF 1 CAPSULE AS DIRECTEDONCE A DAY VIA HANDIHALER 30 capsule 2   telmisartan (MICARDIS) 40 MG tablet TAKE 1 TABLET BY MOUTH AT BEDTIME 90 tablet 1   zinc sulfate 220 (50 Zn) MG capsule Take 1 capsule (220 mg total) by mouth daily. 30 capsule 11   famotidine (PEPCID) 20 MG tablet TAKE 1 TABLET BY MOUTH 2 TIMES DAILY (Patient not taking: Reported on 05/07/2022) 180 tablet 1   No facility-administered medications prior to visit.    Review of Systems;  Patient denies headache, fevers, malaise, unintentional weight loss, skin rash, eye pain, sinus congestion and sinus pain, sore throat, dysphagia,  hemoptysis , cough, dyspnea, wheezing, chest pain, palpitations, orthopnea, edema, abdominal pain, nausea, melena, diarrhea, constipation, flank pain, dysuria, hematuria, urinary  Frequency, nocturia, numbness, tingling, seizures,  Focal weakness, Loss of consciousness,  Tremor, insomnia, depression, anxiety, and suicidal ideation.      Objective:  BP 136/74 (BP Location: Left Arm, Patient Position: Sitting, Cuff Size: Normal)   Pulse 67   Temp  97.6 F (36.4 C) (Oral)   Ht _0  (1.575 m)   Wt 156 lb 12.8 oz (71.1 kg)   SpO2 95%   BMI 28.68 kg/m   BP Readings from Last 3 Encounters:  05/07/22 136/74  02/16/22 128/80  11/16/21 140/74    Wt Readings from Last 3 Encounters:  05/07/22 156 lb 12.8 oz (71.1 kg)  02/16/22 157 lb 3.2 oz (71.3 kg)  11/16/21 160 lb 12.8 oz (72.9 kg)    General appearance: alert, cooperative and appears stated age Ears: normal TM's and external ear canals both ears Throat: lips, mucosa, and tongue normal; teeth and gums normal Neck: no adenopathy, no carotid bruit, supple, symmetrical, trachea midline and thyroid not enlarged,  symmetric, no tenderness/mass/nodules Back: symmetric, no curvature. ROM normal. No CVA tenderness. Lungs: clear to auscultation bilaterally Heart: regular rate and rhythm, S1, S2 normal, no murmur, click, rub or gallop Abdomen: soft, non-tender; bowel sounds normal; no masses,  no organomegaly Pulses: 2+ and symmetric Skin: Skin color, texture, turgor normal. No rashes or lesions Lymph nodes: Cervical, supraclavicular, and axillary nodes normal. Neuro:  awake and interactive with normal mood and affect. Higher cortical functions are normal. Speech is clear without word-finding difficulty or dysarthria. Extraocular movements are intact. Visual fields of both eyes are grossly intact. Sensation to light touch is grossly intact bilaterally of upper and lower extremities. Motor examination shows 4+/5 symmetric hand grip and upper extremity and 5/5 lower extremity strength. There is no pronation or drift. Gait is non-ataxic   Lab Results  Component Value Date   HGBA1C 6.1 09/04/2021   HGBA1C 6.1 05/25/2021   HGBA1C 6.8 (H) 12/12/2020    Lab Results  Component Value Date   CREATININE 0.65 11/13/2021   CREATININE 0.91 11/12/2021   CREATININE 0.88 11/12/2021    Lab Results  Component Value Date   WBC 8.2 11/13/2021   HGB 12.6 11/13/2021   HCT 39.8 11/13/2021   PLT 200 11/13/2021   GLUCOSE 112 (H) 11/13/2021   CHOL 165 11/13/2021   TRIG 95 11/13/2021   HDL 33 (L) 11/13/2021   LDLDIRECT 80.0 04/08/2017   LDLCALC 113 (H) 11/13/2021   ALT 54 (H) 11/13/2021   AST 50 (H) 11/13/2021   NA 136 11/13/2021   K 3.4 (L) 11/13/2021   CL 106 11/13/2021   CREATININE 0.65 11/13/2021   BUN 14 11/13/2021   CO2 22 11/13/2021   TSH 5.46 02/16/2022   HGBA1C 6.1 09/04/2021   MICROALBUR 0.9 05/25/2021    CT Head Wo Contrast  Result Date: 11/12/2021 CLINICAL DATA:  Acute neurologic deficit. Weakness and unsteadiness beginning yesterday. EXAM: CT HEAD WITHOUT CONTRAST TECHNIQUE: Contiguous axial  images were obtained from the base of the skull through the vertex without intravenous contrast. RADIATION DOSE REDUCTION: This exam was performed according to the departmental dose-optimization program which includes automated exposure control, adjustment of the mA and/or kV according to patient size and/or use of iterative reconstruction technique. COMPARISON:  08/03/2021 FINDINGS: Brain: No evidence of intracranial hemorrhage, acute infarction, hydrocephalus, or extra-axial fluid collection. A small calcified meningioma is seen in the right lateral posterior fossa adjacent to the cerebellar hemisphere, measuring 1.5 x 0.9 cm on image 8/2 this remains stable compared prior study. No evidence of mass effect or shift. Vascular: No hyperdense vessel or other acute findings. Skull: No evidence of fracture or other significant bone abnormality. Sinuses/Orbits:  No acute findings. Other: None. IMPRESSION: No acute intracranial abnormality. Stable small calcified meningioma in right  posterior fossa. Electronically Signed   By: Marlaine Hind M.D.   On: 11/12/2021 13:45   DG Chest 2 View  Result Date: 11/12/2021 CLINICAL DATA:  Cough.  Generalized weakness. EXAM: CHEST - 2 VIEW COMPARISON:  08/02/2021 FINDINGS: Stable mild cardiomegaly. Aortic atherosclerotic calcification incidentally noted. Both lungs are clear. IMPRESSION: Stable mild cardiomegaly. No active lung disease. Electronically Signed   By: Marlaine Hind M.D.   On: 11/12/2021 12:01    Assessment & Plan:   Problem List Items Addressed This Visit     Hyperlipidemia LDL goal <100   Relevant Orders   Lipid Profile   Direct LDL   Obesity, diabetes, and hypertension syndrome ([place]) - Primary   Relevant Orders   Comp Met (CMET)   HgB A1c   Urine Microalbumin w/creat. ratio   Other Visit Diagnoses     Frequency of urination       Relevant Orders   Urinalysis, Routine w reflex microscopic   Need for immunization against influenza       Relevant  Orders   Flu Vaccine QUAD High Dose(Fluad) (Completed)       I spent a total of 40  minutes with this patient in a face to face visit on the date of this encounter reviewing the last office visit with me in       March , he r most recent visit with neurology,    ,  patient's diet and exercise habits, home blood pressure  readings,  and post visit ordering of testing and therapeutics.    Follow-up: Return in about 6 months (around 11/05/2022).   [name], MD

## 2022-05-08 LAB — COMPREHENSIVE METABOLIC PANEL
ALT: 76 U/L — ABNORMAL HIGH (ref 0–35)
AST: 79 U/L — ABNORMAL HIGH (ref 0–37)
Albumin: 3.8 g/dL (ref 3.5–5.2)
Alkaline Phosphatase: 598 U/L — ABNORMAL HIGH (ref 39–117)
BUN: 24 mg/dL — ABNORMAL HIGH (ref 6–23)
CO2: 25 mEq/L (ref 19–32)
Calcium: 9.5 mg/dL (ref 8.4–10.5)
Chloride: 99 mEq/L (ref 96–112)
Creatinine, Ser: 0.99 mg/dL (ref 0.40–1.20)
GFR: 51.13 mL/min — ABNORMAL LOW (ref >60.00)
Glucose, Bld: 95 mg/dL (ref 70–99)
Potassium: 4.4 mEq/L (ref 3.5–5.1)
Sodium: 133 mEq/L — ABNORMAL LOW (ref 135–145)
Total Bilirubin: 1.1 mg/dL (ref 0.2–1.2)
Total Protein: 8 g/dL (ref 6.0–8.3)

## 2022-05-08 LAB — URINALYSIS, ROUTINE W REFLEX MICROSCOPIC
Bilirubin Urine: NEGATIVE
Hgb urine dipstick: NEGATIVE
Nitrite: NEGATIVE
RBC / HPF: NONE SEEN (ref 0–?)
Specific Gravity, Urine: 1.02 (ref 1.000–1.030)
Total Protein, Urine: NEGATIVE
Urine Glucose: NEGATIVE
Urobilinogen, UA: 0.2 (ref 0.0–1.0)
pH: 5.5 (ref 5.0–8.0)

## 2022-05-08 LAB — HEMOGLOBIN A1C: Hgb A1c MFr Bld: 6 % (ref 4.6–6.5)

## 2022-05-08 LAB — LIPID PANEL
Cholesterol: 207 mg/dL — ABNORMAL HIGH (ref 0–200)
HDL: 37 mg/dL — ABNORMAL LOW (ref >39.00)
LDL Cholesterol: 137 mg/dL — ABNORMAL HIGH (ref 0–99)
NonHDL: 170.11
Total CHOL/HDL Ratio: 6
Triglycerides: 166 mg/dL — ABNORMAL HIGH (ref 0.0–149.0)
VLDL: 33.2 mg/dL (ref 0.0–40.0)

## 2022-05-08 LAB — MICROALBUMIN / CREATININE URINE RATIO
Creatinine,U: 115.5 mg/dL
Microalb Creat Ratio: 1.4 mg/g (ref 0.0–30.0)
Microalb, Ur: 1.6 mg/dL (ref 0.0–1.9)

## 2022-05-08 LAB — LDL CHOLESTEROL, DIRECT: Direct LDL: 120 mg/dL

## 2022-05-14 ENCOUNTER — Telehealth: Payer: Self-pay | Admitting: Internal Medicine

## 2022-05-14 NOTE — Telephone Encounter (Signed)
Spoke with patient she req CB next week she was not avail this week.

## 2022-05-21 ENCOUNTER — Telehealth: Payer: Self-pay | Admitting: Internal Medicine

## 2022-05-21 NOTE — Telephone Encounter (Signed)
Spoke with patient she req CB 05/2022 she was not feeling well

## 2022-05-28 ENCOUNTER — Telehealth: Payer: Self-pay | Admitting: Internal Medicine

## 2022-05-28 ENCOUNTER — Other Ambulatory Visit: Payer: Self-pay | Admitting: Internal Medicine

## 2022-05-28 DIAGNOSIS — J410 Simple chronic bronchitis: Secondary | ICD-10-CM

## 2022-05-28 NOTE — Telephone Encounter (Signed)
Copied from [place] [id number]. Topic: Medicare AWV >> May 28, 2022 11:14 AM Devoria Glassing wrote: Reason for CRM: Called patient to schedule Annual Wellness Visit.  Please schedule with Nurse Health Advisor Denisa O'Brien-Blaney, LPN at [facility]. This appt can be telephone or office visit.  Please call [phone] ask for [facility]

## 2022-05-28 NOTE — Telephone Encounter (Signed)
Pt would like to be called in regards to the price of her inhaler

## 2022-05-28 NOTE — Telephone Encounter (Signed)
Spoke with pt and she stated that her daughter picked up her Sprivia over the weekend and the cost of it was $130. Pt stated that she was just paying $30 a month. Is there a different inhaler that can be sent in that would be cheaper or should I refer her to [facility] for pharmacy assistance.

## 2022-05-29 NOTE — Telephone Encounter (Signed)
Medication Samples have been provided to the patient.  Drug name: Posey Rea Respimat       Strength: 1.25 mcg        Qty: 1  LOT: [id number]  Exp.Date: Jun 26 2023  Dosing instructions: Inhale 1 puff into lungs daily.   The patient has been instructed regarding the correct time, dose, and frequency of taking this medication, including desired effects and most common side effects.   Kelsey Durflinger 4:22 PM 05/29/2022

## 2022-05-29 NOTE — Telephone Encounter (Signed)
Patient states she is following up on her call from yesterday.  I read Dr. Helene Kelp Tullo's message to patient.  Patient states the Sprivia inhaler has helped.  Patient states she is willing to use another inhaler if it will work just as well and would be cheaper.

## 2022-05-29 NOTE — Telephone Encounter (Signed)
Referral for pharmacy assistance has been made and a sample has been placed up front for pt. Pt is aware and stated that daughter will come by to pick up.

## 2022-05-29 NOTE — Telephone Encounter (Signed)
Dr. Derrel Nip is aware that it is respimat and not the handihaler.

## 2022-06-04 ENCOUNTER — Telehealth: Payer: Self-pay | Admitting: Pharmacist

## 2022-06-04 NOTE — Progress Notes (Signed)
[facility])                                            [place] Team    06/04/2022  MARYSOL WELLNITZ 12/22/1931 [id number]   Spoke briefly with Mrs. Virginie Josten regarding patient assistance program for Spiriva. HIPAA identifiers were verified. Patient will discuss with her daughter, she requests a call back tomorrow.   Loretha Brasil, PharmD [place] Pharmacist Office: [phone]

## 2022-06-04 NOTE — Progress Notes (Signed)
[facility])                                            [place] Team    06/04/2022  ROSEALYNN MATEUS [birth year] [id number]  Received follow up telephone call from patient's daughter, [name] regarding the patient assistance. HIPAA identifiers were confirmed, Mrs. Dalbert Batman will call me back regarding income.   Loretha Brasil, PharmD [place] Pharmacist Office: [phone]

## 2022-06-07 ENCOUNTER — Telehealth: Payer: Self-pay | Admitting: Pharmacist

## 2022-06-07 NOTE — Progress Notes (Signed)
[facility])  [place] Team  06/07/2022  Lauren Morris 10-Aug-1931 [id number]  Reason for referral: Medication Assistance with [place] referral is being closed due to the following reasons:  -I spoke with patient's daughter, [name]. Patient is above the threshold for the household income limit to qualify for the Spiriva.   [place] is happy to assist the patient/family in the future for clinical pharmacy needs, following a discussion from your team about [place] outreach.   Thank you for allowing [facility] pharmacy to be a part of this patient's care.    Loretha Brasil, PharmD [place] Pharmacist Office: [phone]

## 2022-06-13 ENCOUNTER — Other Ambulatory Visit: Payer: Self-pay | Admitting: Internal Medicine

## 2022-06-29 ENCOUNTER — Telehealth: Payer: Self-pay | Admitting: Internal Medicine

## 2022-06-29 MED ORDER — TIOTROPIUM BROMIDE MONOHYDRATE 18 MCG IN CAPS
ORAL_CAPSULE | RESPIRATORY_TRACT | 2 refills | Status: DC
Start: 2022-06-29 — End: 2022-10-13

## 2022-06-29 NOTE — Telephone Encounter (Signed)
Pt need a refill on [facility]

## 2022-06-29 NOTE — Telephone Encounter (Signed)
Medication has been refilled.

## 2022-07-09 ENCOUNTER — Other Ambulatory Visit: Payer: Self-pay | Admitting: Internal Medicine

## 2022-08-23 DIAGNOSIS — H04123 Dry eye syndrome of bilateral lacrimal glands: Secondary | ICD-10-CM | POA: Diagnosis not present

## 2022-08-23 DIAGNOSIS — H00025 Hordeolum internum left lower eyelid: Secondary | ICD-10-CM | POA: Diagnosis not present

## 2022-08-23 DIAGNOSIS — H35373 Puckering of macula, bilateral: Secondary | ICD-10-CM | POA: Diagnosis not present

## 2022-08-23 DIAGNOSIS — H401131 Primary open-angle glaucoma, bilateral, mild stage: Secondary | ICD-10-CM | POA: Diagnosis not present

## 2022-08-24 ENCOUNTER — Telehealth: Payer: Self-pay | Admitting: Internal Medicine

## 2022-08-24 NOTE — Telephone Encounter (Signed)
Copied from [place] [id number]. Topic: Medicare AWV >> Aug 24, 2022  3:25 PM Lollie Marrow wrote: Reason for CRM: Called patient to schedule Medicare Annual Wellness Visit (AWV). Left message for patient to call back and schedule Medicare Annual Wellness Visit (AWV).  Last date of AWV: 10/24/2014  Please schedule an appointment at any time with Denisa, [place].  If any questions, please contact me at [phone].    Thank you,  [place] Direct dial  [phone]

## 2022-09-05 DIAGNOSIS — H18232 Secondary corneal edema, left eye: Secondary | ICD-10-CM | POA: Diagnosis not present

## 2022-09-05 DIAGNOSIS — H04123 Dry eye syndrome of bilateral lacrimal glands: Secondary | ICD-10-CM | POA: Diagnosis not present

## 2022-09-07 DIAGNOSIS — H18232 Secondary corneal edema, left eye: Secondary | ICD-10-CM | POA: Diagnosis not present

## 2022-09-07 DIAGNOSIS — B0052 Herpesviral keratitis: Secondary | ICD-10-CM | POA: Diagnosis not present

## 2022-09-10 DIAGNOSIS — H35373 Puckering of macula, bilateral: Secondary | ICD-10-CM | POA: Diagnosis not present

## 2022-09-10 DIAGNOSIS — H04123 Dry eye syndrome of bilateral lacrimal glands: Secondary | ICD-10-CM | POA: Diagnosis not present

## 2022-09-10 DIAGNOSIS — H16012 Central corneal ulcer, left eye: Secondary | ICD-10-CM | POA: Diagnosis not present

## 2022-09-10 DIAGNOSIS — H18232 Secondary corneal edema, left eye: Secondary | ICD-10-CM | POA: Diagnosis not present

## 2022-09-10 DIAGNOSIS — H401131 Primary open-angle glaucoma, bilateral, mild stage: Secondary | ICD-10-CM | POA: Diagnosis not present

## 2022-09-10 DIAGNOSIS — B0052 Herpesviral keratitis: Secondary | ICD-10-CM | POA: Diagnosis not present

## 2022-09-12 DIAGNOSIS — H16012 Central corneal ulcer, left eye: Secondary | ICD-10-CM | POA: Diagnosis not present

## 2022-09-12 DIAGNOSIS — B0052 Herpesviral keratitis: Secondary | ICD-10-CM | POA: Diagnosis not present

## 2022-09-14 DIAGNOSIS — H18232 Secondary corneal edema, left eye: Secondary | ICD-10-CM | POA: Diagnosis not present

## 2022-09-14 DIAGNOSIS — H16012 Central corneal ulcer, left eye: Secondary | ICD-10-CM | POA: Diagnosis not present

## 2022-09-14 DIAGNOSIS — B0052 Herpesviral keratitis: Secondary | ICD-10-CM | POA: Diagnosis not present

## 2022-09-20 DIAGNOSIS — B0052 Herpesviral keratitis: Secondary | ICD-10-CM | POA: Diagnosis not present

## 2022-09-24 DIAGNOSIS — H16012 Central corneal ulcer, left eye: Secondary | ICD-10-CM | POA: Diagnosis not present

## 2022-09-24 DIAGNOSIS — B0052 Herpesviral keratitis: Secondary | ICD-10-CM | POA: Diagnosis not present

## 2022-09-24 DIAGNOSIS — H04123 Dry eye syndrome of bilateral lacrimal glands: Secondary | ICD-10-CM | POA: Diagnosis not present

## 2022-10-09 DIAGNOSIS — B0052 Herpesviral keratitis: Secondary | ICD-10-CM | POA: Diagnosis not present

## 2022-10-09 DIAGNOSIS — H16012 Central corneal ulcer, left eye: Secondary | ICD-10-CM | POA: Diagnosis not present

## 2022-10-09 DIAGNOSIS — H401131 Primary open-angle glaucoma, bilateral, mild stage: Secondary | ICD-10-CM | POA: Diagnosis not present

## 2022-10-09 DIAGNOSIS — H35373 Puckering of macula, bilateral: Secondary | ICD-10-CM | POA: Diagnosis not present

## 2022-10-09 DIAGNOSIS — H16223 Keratoconjunctivitis sicca, not specified as Sjogren's, bilateral: Secondary | ICD-10-CM | POA: Diagnosis not present

## 2022-10-09 DIAGNOSIS — H18232 Secondary corneal edema, left eye: Secondary | ICD-10-CM | POA: Diagnosis not present

## 2022-10-10 ENCOUNTER — Inpatient Hospital Stay
Admission: EM | Admit: 2022-10-10 | Discharge: 2022-10-13 | DRG: 689 | Disposition: A | Discharge status: Skilled Nursing Facility | Payer: Medicare Other | Attending: Internal Medicine | Admitting: Internal Medicine

## 2022-10-10 ENCOUNTER — Encounter: Payer: Self-pay | Admitting: Internal Medicine

## 2022-10-10 ENCOUNTER — Other Ambulatory Visit: Payer: Self-pay

## 2022-10-10 ENCOUNTER — Emergency Department: Payer: Medicare Other

## 2022-10-10 DIAGNOSIS — I959 Hypotension, unspecified: Secondary | ICD-10-CM | POA: Diagnosis present

## 2022-10-10 DIAGNOSIS — I10 Essential (primary) hypertension: Secondary | ICD-10-CM | POA: Diagnosis not present

## 2022-10-10 DIAGNOSIS — M6281 Muscle weakness (generalized): Secondary | ICD-10-CM | POA: Diagnosis not present

## 2022-10-10 DIAGNOSIS — H409 Unspecified glaucoma: Secondary | ICD-10-CM | POA: Diagnosis not present

## 2022-10-10 DIAGNOSIS — K7460 Unspecified cirrhosis of liver: Secondary | ICD-10-CM | POA: Diagnosis present

## 2022-10-10 DIAGNOSIS — Z86711 Personal history of pulmonary embolism: Secondary | ICD-10-CM | POA: Diagnosis not present

## 2022-10-10 DIAGNOSIS — R0902 Hypoxemia: Secondary | ICD-10-CM | POA: Diagnosis not present

## 2022-10-10 DIAGNOSIS — Z79899 Other long term (current) drug therapy: Secondary | ICD-10-CM | POA: Diagnosis not present

## 2022-10-10 DIAGNOSIS — R2689 Other abnormalities of gait and mobility: Secondary | ICD-10-CM | POA: Diagnosis not present

## 2022-10-10 DIAGNOSIS — R54 Age-related physical debility: Secondary | ICD-10-CM | POA: Diagnosis present

## 2022-10-10 DIAGNOSIS — A419 Sepsis, unspecified organism: Secondary | ICD-10-CM | POA: Diagnosis not present

## 2022-10-10 DIAGNOSIS — Z66 Do not resuscitate: Secondary | ICD-10-CM | POA: Diagnosis present

## 2022-10-10 DIAGNOSIS — N390 Urinary tract infection, site not specified: Secondary | ICD-10-CM | POA: Diagnosis not present

## 2022-10-10 DIAGNOSIS — I152 Hypertension secondary to endocrine disorders: Secondary | ICD-10-CM | POA: Diagnosis present

## 2022-10-10 DIAGNOSIS — R6520 Severe sepsis without septic shock: Secondary | ICD-10-CM | POA: Diagnosis present

## 2022-10-10 DIAGNOSIS — R6510 Systemic inflammatory response syndrome (SIRS) of non-infectious origin without acute organ dysfunction: Secondary | ICD-10-CM | POA: Diagnosis not present

## 2022-10-10 DIAGNOSIS — J4489 Other specified chronic obstructive pulmonary disease: Secondary | ICD-10-CM | POA: Diagnosis not present

## 2022-10-10 DIAGNOSIS — I872 Venous insufficiency (chronic) (peripheral): Secondary | ICD-10-CM | POA: Diagnosis not present

## 2022-10-10 DIAGNOSIS — J449 Chronic obstructive pulmonary disease, unspecified: Secondary | ICD-10-CM | POA: Diagnosis not present

## 2022-10-10 DIAGNOSIS — Z8616 Personal history of COVID-19: Secondary | ICD-10-CM

## 2022-10-10 DIAGNOSIS — E8770 Fluid overload, unspecified: Secondary | ICD-10-CM | POA: Diagnosis not present

## 2022-10-10 DIAGNOSIS — E669 Obesity, unspecified: Secondary | ICD-10-CM | POA: Diagnosis present

## 2022-10-10 DIAGNOSIS — Z7401 Bed confinement status: Secondary | ICD-10-CM | POA: Diagnosis not present

## 2022-10-10 DIAGNOSIS — Z7982 Long term (current) use of aspirin: Secondary | ICD-10-CM

## 2022-10-10 DIAGNOSIS — R531 Weakness: Secondary | ICD-10-CM

## 2022-10-10 DIAGNOSIS — E039 Hypothyroidism, unspecified: Secondary | ICD-10-CM | POA: Diagnosis present

## 2022-10-10 DIAGNOSIS — H018 Other specified inflammations of eyelid: Secondary | ICD-10-CM | POA: Diagnosis not present

## 2022-10-10 DIAGNOSIS — Z9989 Dependence on other enabling machines and devices: Secondary | ICD-10-CM

## 2022-10-10 DIAGNOSIS — Z8249 Family history of ischemic heart disease and other diseases of the circulatory system: Secondary | ICD-10-CM | POA: Diagnosis not present

## 2022-10-10 DIAGNOSIS — G4733 Obstructive sleep apnea (adult) (pediatric): Secondary | ICD-10-CM | POA: Diagnosis not present

## 2022-10-10 DIAGNOSIS — E119 Type 2 diabetes mellitus without complications: Secondary | ICD-10-CM | POA: Diagnosis present

## 2022-10-10 DIAGNOSIS — D320 Benign neoplasm of cerebral meninges: Secondary | ICD-10-CM | POA: Diagnosis not present

## 2022-10-10 DIAGNOSIS — Z90710 Acquired absence of both cervix and uterus: Secondary | ICD-10-CM | POA: Diagnosis not present

## 2022-10-10 DIAGNOSIS — R278 Other lack of coordination: Secondary | ICD-10-CM | POA: Diagnosis not present

## 2022-10-10 DIAGNOSIS — N3000 Acute cystitis without hematuria: Principal | ICD-10-CM | POA: Diagnosis present

## 2022-10-10 DIAGNOSIS — Z8701 Personal history of pneumonia (recurrent): Secondary | ICD-10-CM

## 2022-10-10 DIAGNOSIS — J810 Acute pulmonary edema: Secondary | ICD-10-CM | POA: Diagnosis not present

## 2022-10-10 DIAGNOSIS — R748 Abnormal levels of other serum enzymes: Secondary | ICD-10-CM | POA: Insufficient documentation

## 2022-10-10 DIAGNOSIS — H8110 Benign paroxysmal vertigo, unspecified ear: Secondary | ICD-10-CM | POA: Diagnosis not present

## 2022-10-10 DIAGNOSIS — K219 Gastro-esophageal reflux disease without esophagitis: Secondary | ICD-10-CM | POA: Diagnosis not present

## 2022-10-10 DIAGNOSIS — J90 Pleural effusion, not elsewhere classified: Secondary | ICD-10-CM | POA: Diagnosis not present

## 2022-10-10 DIAGNOSIS — J811 Chronic pulmonary edema: Secondary | ICD-10-CM | POA: Diagnosis not present

## 2022-10-10 DIAGNOSIS — Z741 Need for assistance with personal care: Secondary | ICD-10-CM | POA: Diagnosis not present

## 2022-10-10 DIAGNOSIS — R0689 Other abnormalities of breathing: Secondary | ICD-10-CM | POA: Diagnosis not present

## 2022-10-10 HISTORY — DX: Systemic inflammatory response syndrome (sirs) of non-infectious origin without acute organ dysfunction: R65.10

## 2022-10-10 LAB — URINALYSIS, ROUTINE W REFLEX MICROSCOPIC
Bilirubin Urine: NEGATIVE
Glucose, UA: NEGATIVE mg/dL
Hgb urine dipstick: NEGATIVE
Ketones, ur: NEGATIVE mg/dL
Leukocytes,Ua: NEGATIVE
Nitrite: POSITIVE — AB
Protein, ur: NEGATIVE mg/dL
Specific Gravity, Urine: 1.016 (ref 1.005–1.030)
pH: 5 (ref 5.0–8.0)

## 2022-10-10 LAB — CBC WITH DIFFERENTIAL/PLATELET
Abs Immature Granulocytes: 0.06 10*3/uL (ref 0.00–0.07)
Basophils Absolute: 0 10*3/uL (ref 0.0–0.1)
Basophils Relative: 0 %
Eosinophils Absolute: 0.1 10*3/uL (ref 0.0–0.5)
Eosinophils Relative: 1 %
HCT: 45.1 % (ref 36.0–46.0)
Hemoglobin: 14.3 g/dL (ref 12.0–15.0)
Immature Granulocytes: 1 %
Lymphocytes Relative: 12 %
Lymphs Abs: 1.3 10*3/uL (ref 0.7–4.0)
MCH: 30.2 pg (ref 26.0–34.0)
MCHC: 31.7 g/dL (ref 30.0–36.0)
MCV: 95.1 fL (ref 80.0–100.0)
Monocytes Absolute: 0.9 10*3/uL (ref 0.1–1.0)
Monocytes Relative: 8 %
Neutro Abs: 8.7 10*3/uL — ABNORMAL HIGH (ref 1.7–7.7)
Neutrophils Relative %: 78 %
Platelets: 205 10*3/uL (ref 150–400)
RBC: 4.74 MIL/uL (ref 3.87–5.11)
RDW: 15.4 % (ref 11.5–15.5)
WBC: 11 10*3/uL — ABNORMAL HIGH (ref 4.0–10.5)
nRBC: 0 % (ref 0.0–0.2)

## 2022-10-10 LAB — PROTIME-INR
INR: 1.2 (ref 0.8–1.2)
Prothrombin Time: 14.9 seconds (ref 11.4–15.2)

## 2022-10-10 LAB — LACTIC ACID, PLASMA
Lactic Acid, Venous: 1.7 mmol/L (ref 0.5–1.9)
Lactic Acid, Venous: 1.4 mmol/L (ref 0.5–1.9)

## 2022-10-10 LAB — COMPREHENSIVE METABOLIC PANEL
ALT: 87 U/L — ABNORMAL HIGH (ref 0–44)
AST: 105 U/L — ABNORMAL HIGH (ref 15–41)
Albumin: 3.3 g/dL — ABNORMAL LOW (ref 3.5–5.0)
Alkaline Phosphatase: 601 U/L — ABNORMAL HIGH (ref 38–126)
Anion gap: 9 (ref 5–15)
BUN: 23 mg/dL (ref 8–23)
CO2: 21 mmol/L — ABNORMAL LOW (ref 22–32)
Calcium: 8.9 mg/dL (ref 8.9–10.3)
Chloride: 106 mmol/L (ref 98–111)
Creatinine, Ser: 0.99 mg/dL (ref 0.44–1.00)
GFR, Estimated: 55 mL/min — ABNORMAL LOW (ref >60)
Glucose, Bld: 184 mg/dL — ABNORMAL HIGH (ref 70–99)
Potassium: 3.8 mmol/L (ref 3.5–5.1)
Sodium: 136 mmol/L (ref 135–145)
Total Bilirubin: 1.4 mg/dL — ABNORMAL HIGH (ref 0.3–1.2)
Total Protein: 8 g/dL (ref 6.5–8.1)

## 2022-10-10 LAB — PROCALCITONIN: Procalcitonin: 0.3 ng/mL

## 2022-10-10 MED ORDER — ADULT MULTIVITAMIN W/MINERALS CH
1.0000 | ORAL_TABLET | Freq: Every day | ORAL | Status: DC
  Administered 2022-10-10 – 2022-10-13 (×4): 1 via ORAL
  Filled 2022-10-10 (×4): qty 1

## 2022-10-10 MED ORDER — SODIUM CHLORIDE 0.9 % IV SOLN
2.0000 g | INTRAVENOUS | Status: AC
  Administered 2022-10-11 – 2022-10-12 (×2): 2 g via INTRAVENOUS
  Filled 2022-10-10: qty 2
  Filled 2022-10-10: qty 20

## 2022-10-10 MED ORDER — DORZOLAMIDE HCL-TIMOLOL MAL 2-0.5 % OP SOLN
1.0000 [drp] | Freq: Two times a day (BID) | OPHTHALMIC | Status: DC
  Administered 2022-10-10 – 2022-10-13 (×6): 1 [drp] via OPHTHALMIC
  Filled 2022-10-10: qty 10

## 2022-10-10 MED ORDER — SODIUM CHLORIDE 0.9 % IV SOLN: INTRAVENOUS | Status: DC

## 2022-10-10 MED ORDER — ONDANSETRON HCL 4 MG PO TABS: 4.0000 mg | ORAL_TABLET | Freq: Four times a day (QID) | ORAL | Status: DC | PRN

## 2022-10-10 MED ORDER — ALBUTEROL SULFATE (2.5 MG/3ML) 0.083% IN NEBU: 2.5000 mg | INHALATION_SOLUTION | Freq: Four times a day (QID) | RESPIRATORY_TRACT | Status: DC | PRN

## 2022-10-10 MED ORDER — VALACYCLOVIR HCL 500 MG PO TABS
500.0000 mg | ORAL_TABLET | Freq: Every day | ORAL | Status: DC
  Administered 2022-10-10 – 2022-10-12 (×3): 500 mg via ORAL
  Filled 2022-10-10 (×3): qty 1

## 2022-10-10 MED ORDER — ACETAMINOPHEN 650 MG RE SUPP: 650.0000 mg | Freq: Four times a day (QID) | RECTAL | Status: DC | PRN

## 2022-10-10 MED ORDER — ACETAMINOPHEN 325 MG PO TABS
650.0000 mg | ORAL_TABLET | Freq: Four times a day (QID) | ORAL | Status: DC | PRN
  Administered 2022-10-12: 650 mg via ORAL
  Filled 2022-10-10: qty 2

## 2022-10-10 MED ORDER — SODIUM CHLORIDE 0.9 % IV BOLUS
1000.0000 mL | Freq: Once | INTRAVENOUS | Status: AC
  Administered 2022-10-10: 1000 mL via INTRAVENOUS

## 2022-10-10 MED ORDER — ENOXAPARIN SODIUM 40 MG/0.4ML IJ SOSY
40.0000 mg | PREFILLED_SYRINGE | INTRAMUSCULAR | Status: DC
  Administered 2022-10-10 – 2022-10-12 (×3): 40 mg via SUBCUTANEOUS
  Filled 2022-10-10 (×3): qty 0.4

## 2022-10-10 MED ORDER — HYDRALAZINE HCL 20 MG/ML IJ SOLN: 5.0000 mg | Freq: Three times a day (TID) | INTRAMUSCULAR | Status: DC | PRN

## 2022-10-10 MED ORDER — VITAMIN C 500 MG PO TABS
1000.0000 mg | ORAL_TABLET | Freq: Every day | ORAL | Status: DC
  Administered 2022-10-11 – 2022-10-13 (×3): 1000 mg via ORAL
  Filled 2022-10-10 (×3): qty 2

## 2022-10-10 MED ORDER — OMEGA-3-ACID ETHYL ESTERS 1 G PO CAPS
2.0000 g | ORAL_CAPSULE | Freq: Every day | ORAL | Status: DC
  Administered 2022-10-11 – 2022-10-13 (×3): 2 g via ORAL
  Filled 2022-10-10 (×3): qty 2

## 2022-10-10 MED ORDER — LACTATED RINGERS IV BOLUS (SEPSIS)
1000.0000 mL | Freq: Once | INTRAVENOUS | Status: AC
  Administered 2022-10-10: 1000 mL via INTRAVENOUS

## 2022-10-10 MED ORDER — MELATONIN 5 MG PO TABS: 5.0000 mg | ORAL_TABLET | Freq: Every evening | ORAL | Status: DC | PRN

## 2022-10-10 MED ORDER — POLYVINYL ALCOHOL 1.4 % OP SOLN
1.0000 [drp] | Freq: Three times a day (TID) | OPHTHALMIC | Status: DC
  Administered 2022-10-10 – 2022-10-13 (×9): 1 [drp] via OPHTHALMIC
  Filled 2022-10-10 (×2): qty 15

## 2022-10-10 MED ORDER — ONDANSETRON HCL 4 MG/2ML IJ SOLN: 4.0000 mg | Freq: Four times a day (QID) | INTRAMUSCULAR | Status: DC | PRN

## 2022-10-10 MED ORDER — SODIUM CHLORIDE 0.9 % IV SOLN
2.0000 g | Freq: Once | INTRAVENOUS | Status: AC
  Administered 2022-10-10: 2 g via INTRAVENOUS
  Filled 2022-10-10: qty 20

## 2022-10-10 MED ORDER — ASPIRIN 81 MG PO TBEC
81.0000 mg | DELAYED_RELEASE_TABLET | Freq: Every day | ORAL | Status: DC
  Administered 2022-10-11 – 2022-10-13 (×3): 81 mg via ORAL
  Filled 2022-10-10 (×3): qty 1

## 2022-10-10 MED ORDER — FAMOTIDINE 20 MG PO TABS
20.0000 mg | ORAL_TABLET | Freq: Every day | ORAL | Status: DC
  Administered 2022-10-11: 20 mg via ORAL
  Filled 2022-10-10: qty 1

## 2022-10-10 MED ORDER — LACTATED RINGERS IV SOLN: 150.0000 mL/h | INTRAVENOUS | Status: DC

## 2022-10-10 MED ORDER — ZINC SULFATE 220 (50 ZN) MG PO CAPS
220.0000 mg | ORAL_CAPSULE | Freq: Every day | ORAL | Status: DC
  Administered 2022-10-10 – 2022-10-13 (×4): 220 mg via ORAL
  Filled 2022-10-10 (×4): qty 1

## 2022-10-10 NOTE — Assessment & Plan Note (Addendum)
With UTI and mild hypotension responsive to IV fluid, increased liver enzymes Blood cultures x 2 are in process Will continue to follow second lactic acid Check procalcitonin Continue ceftriaxone 2 g IV daily Additional LR 1 L bolus ordered on admission (1 L NS ordered and given by EDP) to complete 2 L bolus Sodium chloride infusion at 150 mL/h, 1 day ordered Goal MAP is greater than 65 Admit to inpatient, telemetry medical

## 2022-10-10 NOTE — Sepsis Progress Note (Signed)
Code Sepsis protocol being monitored by eLink. 

## 2022-10-10 NOTE — ED Triage Notes (Signed)
Pt here for weakness. Pt states has been having pain with urination. Rhonda, pa in to see pt in triage. Pt denies headache, abd pain. Pt alert and oriented x3.

## 2022-10-10 NOTE — Assessment & Plan Note (Signed)
Presumed secondary to SIRS, completed by history of nonalcoholic liver cirrhosis, will continue to fluid  CMP in the AM

## 2022-10-10 NOTE — Assessment & Plan Note (Signed)
Fall precautions.  

## 2022-10-10 NOTE — Assessment & Plan Note (Signed)
-   CPAP nightly ordered 

## 2022-10-10 NOTE — ED Provider Triage Note (Signed)
Emergency Medicine Provider Triage Evaluation Note  Lauren Morris , a 89 y.o. female  was evaluated in triage.  Pt complains of presents to the ED via EMS with complaint of urinary symptoms.  Patient noted to have hypotension in triage.  Review of Systems  Positive: Increased weakness, history of hypertension, left knee pain. Negative: Negative chest pain, shortness of breath.  Physical Exam  BP (!) 82/44   Pulse 63   Temp 98.2 F (36.8 C) (Oral)   Resp 17   Ht  (1.575 m)   Wt 71.7 kg   SpO2 94%   BMI 28.90 kg/m  Gen:   Awake, no distress  talkative Resp:  Normal effort   MSK:   Moves extremities, no edema lower extremity Other:    Medical Decision Making  Medically screening exam initiated at 9:58 AM.  Appropriate orders placed.  Lauren Morris was informed that the remainder of the evaluation will be completed by another provider, this initial triage assessment does not replace that evaluation, and the importance of remaining in the ED until their evaluation is complete.     Tommi Rumps, PA-C 10/10/22 1012

## 2022-10-10 NOTE — ED Triage Notes (Signed)
First Nurse Note;  Pt via [facility] from home. Pt c/o increased weakness since this morning. Pt is normally ambulatory. Pt has hx of HTN. Pt c/o L knee soreness but denies any recent falls. Pt is A&Ox4 and NAD 156/107 Bp 98.2 temp 100% on RA 73 HR 172 CBG  20G R forearm.

## 2022-10-10 NOTE — Assessment & Plan Note (Addendum)
Holding scheduled home antihypertensive medications at this time due to low normotensive, low blood pressure initially on ED presentation AM team to resume home antihypertensive when the benefits outweigh the risk Hydralazine 5 mg IV every 8 hours as needed for SBP greater than 175, 4 day ordered

## 2022-10-10 NOTE — Assessment & Plan Note (Signed)
-   This complicates overall care and prognosis.  

## 2022-10-10 NOTE — Hospital Course (Signed)
Ms. Lauren Morris is an 89 year old female with hypertension, GERD, Graves' disease with history of hypothyroid, OSA on CPAP, who presents emergency department for chief concerns of weakness and dysuria.  Vitals showed temperature of 98.2, respiration of 18, heart rate 63, blood pressure initially 82/44, SpO2 of 94% on room air.  Serum sodium is 136, potassium 3.8, chloride 106, bicarb 21, BUN of 23, serum creatinine 0.99, nonfasting blood glucose 184, WBC 11, hemoglobin 14.3, platelets of 205.  Lactic acid was initially 1.7.  UA was positive for nitrates.  ED treatment: Ceftriaxone 2 g IV one-time dose, sodium chloride 1 L bolus.  Patient's blood pressure improved to 115/55 with a MAP of 72

## 2022-10-10 NOTE — H&P (Addendum)
History and Physical   Lauren Morris:[id number] DOB: 1932-03-19 DOA: 10/10/2022  PCP: Sherlene Shams, MD  Outpatient Specialists: Dr. Elvera Lennox, endocrinology Patient coming from: Home via EMS  I have personally briefly reviewed patient's old medical records in [facility] EMR.  Chief Concern: Weakness, dysuria  HPI: Lauren Morris is an 89 year old female with hypertension, GERD, Graves' disease with history of hypothyroid, OSA on CPAP, who presents emergency department for chief concerns of weakness and dysuria.  Vitals showed temperature of 98.2, respiration of 18, heart rate 63, blood pressure initially 82/44, SpO2 of 94% on room air.  Serum sodium is 136, potassium 3.8, chloride 106, bicarb 21, BUN of 23, serum creatinine 0.99, nonfasting blood glucose 184, WBC 11, hemoglobin 14.3, platelets of 205.  Lactic acid was initially 1.7.  UA was positive for nitrates.  ED treatment: Ceftriaxone 2 g IV one-time dose, sodium chloride 1 L bolus.  Patient's blood pressure improved to 115/55 with a MAP of 72 --------------------------- At bedside, patient was able to tell me her name, age, location, current calendar year.  She was able to identify her daughter Lauren Morris at bedside.  She reports that this morning she felt too weak to get up from bed.  She reports that at baseline she performs her own ADLs.  She does her own cooking and loads the dishes in the dishwasher.  She does not really clean the house as she has a helper that occasionally comes to help clean her home.  She denies chest pain, shortness of breath, current swelling of her lower extremities, nausea, vomiting, fever, cough.  She endorses chills.  She denies abdominal pain, syncope, loss of consciousness.  She denies dysuria, hematuria, changes in urinary habits that she is aware of.  Social history: She lives at home on her own.  She denies tobacco, EtOH, recreational drug use.  ROS: Constitutional: no weight change, no  fever ENT/Mouth: no sore throat, no rhinorrhea Eyes: no eye pain, no vision changes Cardiovascular: no chest pain, no dyspnea,  no edema, no palpitations Respiratory: no cough, no sputum, no wheezing Gastrointestinal: no nausea, no vomiting, no diarrhea, no constipation Genitourinary: no urinary incontinence, no dysuria, no hematuria Musculoskeletal: no arthralgias, no myalgias Skin: no skin lesions, no pruritus, Neuro: + weakness, no loss of consciousness, no syncope Psych: no anxiety, no depression, no decrease appetite Heme/Lymph: no bruising, no bleeding  ED Course: Discussed with emergency medicine provider, patient requiring hospitalization for chief concerns of UTI with hypotension.  Assessment/Plan  Principal Problem:   SIRS (systemic inflammatory response syndrome) Active Problems:   Essential hypertension   Generalized weakness   Obesity, diabetes, and hypertension syndrome   Do not resuscitate status   OSA on CPAP   Elevated liver enzymes   Assessment and Plan:  * SIRS (systemic inflammatory response syndrome) With UTI and mild hypotension responsive to IV fluid, increased liver enzymes Blood cultures x 2 are in process Will continue to follow second lactic acid Check procalcitonin Continue ceftriaxone 2 g IV daily Additional LR 1 L bolus ordered on admission (1 L NS ordered and given by EDP) to complete 2 L bolus Sodium chloride infusion at 150 mL/h, 1 day ordered Goal MAP is greater than 65 Admit to inpatient, telemetry medical  Elevated liver enzymes Presumed secondary to SIRS, completed by history of nonalcoholic liver cirrhosis, will continue to fluid  CMP in the AM  OSA on CPAP CPAP nightly ordered  Do not resuscitate status DNR form uploaded to  [employer]  Obesity, diabetes, and hypertension syndrome This complicates overall care and prognosis.   Generalized weakness Fall precautions  Essential hypertension Holding scheduled home  antihypertensive medications at this time due to low normotensive, low blood pressure initially on ED presentation AM team to resume home antihypertensive when the benefits outweigh the risk Hydralazine 5 mg IV every 8 hours as needed for SBP greater than 175, 4 day ordered  Chart reviewed.   DVT prophylaxis: Enoxaparin 40 mg subcutaneous every 24 hours, TED hose Code Status: DNR confirmed with patient and daughter at bedside.  Her MOST form was at bedside and I took an image and loaded it to [employer] Diet: Heart healthy Family Communication: Updated daughter, [place] Morris at bedside with patient's permission Disposition Plan: Pending clinical course Consults called: None at this time Admission status: Inpatient, telemetry medical  Past Medical History:  Diagnosis Date   Cyst    hx o on right breast   DJD (degenerative joint disease)    right knee   Glaucoma    Hypertension    Pneumonia due to COVID-19 virus 12/12/2020   Sleep apnea    Thyroid disease    Past Surgical History:  Procedure Laterality Date   ABDOMINAL HYSTERECTOMY     VITRECTOMY  July 2012   right eye with membrane peel Lelon Perla, [place])   Social History:  reports that she has never smoked. She has never used smokeless tobacco. She reports that she does not drink alcohol and does not use drugs.  No Known Allergies Family History  Problem Relation Age of Onset   Cancer Father    Hypertension Father    Heart attack Mother    Hypertension Mother    Family history: Family history reviewed and not pertinent.  Prior to Admission medications   Medication Sig Start Date End Date Taking? Authorizing Provider  moxifloxacin (VIGAMOX) 0.5 % ophthalmic solution Place 1 drop into both eyes 3 (three) times daily. 09/05/22  Yes [provider]  prednisoLONE acetate (PRED FORTE) 1 % ophthalmic suspension Place 1 drop into both eyes 4 (four) times daily. 09/06/22  Yes [provider]  RESTASIS 0.05 % ophthalmic  emulsion Place 1 drop into both eyes 2 (two) times daily. 10/09/22  Yes [provider]  tobramycin-dexamethasone Wallene Dales) ophthalmic solution Place 1 drop into the left eye 4 (four) times daily. 08/23/22  Yes [provider]  valACYclovir (VALTREX) 500 MG tablet Take 500 mg by mouth daily. 09/17/22  Yes [provider]  Ascorbic Acid (VITAMIN C) 1000 MG tablet Take 1 tablet (1,000 mg total) by mouth daily. 09/04/21   Sherlene Shams, MD  Aspirin 81 MG CAPS Take by mouth.    [provider]  atenolol (TENORMIN) 25 MG tablet TAKE 1 TABLET BY MOUTH TWICE DAILY 06/14/22   Sherlene Shams, MD  Calcium Carb-Cholecalciferol (CALCIUM 1000 + D PO) Take 1 tablet by mouth daily.    [provider]  dorzolamide-timolol (COSOPT) 22.3-6.8 MG/ML ophthalmic solution Place 1 drop into both eyes 2 (two) times daily.    [provider]  famotidine (PEPCID) 20 MG tablet TAKE 1 TABLET BY MOUTH 2 TIMES DAILY 06/14/22   Sherlene Shams, MD  fish oil-omega-3 fatty acids 1000 MG capsule Take 2 capsules (2 g total) by mouth daily. 09/04/21   Sherlene Shams, MD  furosemide (LASIX) 20 MG tablet Take 1 tablet (20 mg total) by mouth daily. TAKE ONE TABLET BY MOUTH DAILY AS NEEDED FOR  FLUID RETENTION 09/04/21   Sherlene Shams, MD  Glycerin-Hypromellose-PEG 400 (VISINE DRY EYE) 0.2-0.2-1 % SOLN Place 1 drop into both eyes 3 (three) times daily.    [provider]  Multiple Vitamin (MULTIVITAMIN WITH MINERALS) TABS tablet Take 1 tablet by mouth daily. 11/27/20   Regalado, Belkys A, MD  telmisartan (MICARDIS) 40 MG tablet TAKE 1 TABLET BY MOUTH AT BEDTIME 07/09/22   Sherlene Shams, MD  tiotropium (SPIRIVA HANDIHALER) 18 MCG inhalation capsule INHALE CONTENTS OF 1 CAPSULE AS DIRECTEDONCE A DAY VIA HANDIHALER 06/29/22   Sherlene Shams, MD  zinc sulfate 220 (50 Zn) MG capsule Take 1 capsule (220 mg total) by mouth daily. 09/04/21   Sherlene Shams, MD   Physical Exam: Vitals:    10/10/22 1059 10/10/22 1154 10/10/22 1343 10/10/22 1453  BP: (!) 115/55 (!) 116/56 119/67 114/70  Pulse: 71 66 66 65  Resp: Temp: 97.8 F (36.6 C)  98.1 F (36.7 C) 97.9 F (36.6 C)  TempSrc: Oral  Oral Oral  SpO2: 94% 94% 95% 98%  Weight:      Height:       Constitutional: appears age-appropriate, frail, NAD, calm, comfortable Eyes: PERRL, lids and conjunctivae normal ENMT: Mucous membranes are moist. Posterior pharynx clear of any exudate or lesions. Age-appropriate dentition. Hearing appropriate Neck: normal, supple, no masses, no thyromegaly Respiratory: clear to auscultation bilaterally, no wheezing, no crackles. Normal respiratory effort. No accessory muscle use.  Cardiovascular: Regular rate and rhythm, no murmurs / rubs / gallops. No extremity edema. 2+ pedal pulses. No carotid bruits.  Abdomen: no tenderness, no masses palpated, no hepatosplenomegaly. Bowel sounds positive.  GU: No suprapubic tenderness.  PurWick in place Musculoskeletal: no clubbing / cyanosis. No joint deformity upper and lower extremities. Good ROM, no contractures, no atrophy. Normal muscle tone.  Skin: no rashes, lesions, ulcers. No induration Neurologic: Sensation intact. Strength 5/5 in all 4.  Psychiatric: Normal judgment and insight. Alert and oriented x 3. Normal mood.   EKG: independently reviewed, showing sinus rhythm with rate of 60, QTc 422  Chest x-ray on Admission: I personally reviewed and I agree with radiologist reading as below.  DG Chest Port 1 View  Result Date: 10/10/2022 CLINICAL DATA:  Sepsis EXAM: PORTABLE CHEST 1 VIEW COMPARISON:  Radiograph 11/12/2021 FINDINGS: Unchanged mildly enlarged cardiac silhouette. There is no new focal airspace disease. There is no large effusion or evidence of pneumothorax. No acute osseous abnormality. Thoracic spondylosis. IMPRESSION: Unchanged mildly enlarged cardiac silhouette. No new focal airspace disease. Electronically Signed   By:  Caprice Renshaw M.D.   On: 10/10/2022 10:32    Labs on Admission: I have personally reviewed following labs  CBC: Recent Labs  Lab 10/10/22 1009  WBC 11.0*  NEUTROABS 8.7*  HGB 14.3  HCT 45.1  MCV 95.1  PLT 205   Basic Metabolic Panel: Recent Labs  Lab 10/10/22 1009  NA 136  K 3.8  CL 106  CO2 21*  GLUCOSE 184*  BUN 23  CREATININE 0.99  CALCIUM 8.9   GFR: Estimated Creatinine Clearance: 36.4 mL/min (by C-G formula based on SCr of 0.99 mg/dL).  Liver Function Tests: Recent Labs  Lab 10/10/22 1009  AST 105*  ALT 87*  ALKPHOS 601*  BILITOT 1.4*  PROT 8.0  ALBUMIN 3.3*   Coagulation Profile: Recent Labs  Lab 10/10/22 1009  INR 1.2   Urine analysis:    Component Value Date/Time   COLORURINE AMBER (A)  10/10/2022 1101   APPEARANCEUR HAZY (A) 10/10/2022 1101   LABSPEC 1.016 10/10/2022 1101   PHURINE 5.0 10/10/2022 1101   GLUCOSEU NEGATIVE 10/10/2022 1101   GLUCOSEU NEGATIVE 05/07/2022 1638   HGBUR NEGATIVE 10/10/2022 1101   BILIRUBINUR NEGATIVE 10/10/2022 1101   BILIRUBINUR neg 05/09/2018 1105   KETONESUR NEGATIVE 10/10/2022 1101   PROTEINUR NEGATIVE 10/10/2022 1101   UROBILINOGEN 0.2 05/07/2022 1638   NITRITE POSITIVE (A) 10/10/2022 1101   LEUKOCYTESUR NEGATIVE 10/10/2022 1101   This document was prepared using Dragon Voice Recognition software and may include unintentional dictation errors.  Dr. Sedalia Muta [facility]  If 7PM-7AM, please contact overnight-coverage provider If 7AM-7PM, please contact day coverage provider [email or web address]  10/10/2022, 4:10 PM

## 2022-10-10 NOTE — Assessment & Plan Note (Signed)
DNR form uploaded to [employer]

## 2022-10-10 NOTE — Consult Note (Signed)
CODE SEPSIS - PHARMACY COMMUNICATION  **Broad Spectrum Antibiotics should be administered within 1 hour of Sepsis diagnosis**  Time Code Sepsis Called/Page Received: 1254  Antibiotics Ordered: Ceftriaxone  Time of 1st antibiotic administration: 1237  Additional action taken by pharmacy: n/a  If necessary, Name of Provider/Nurse Contacted: na   Londyn Hotard Rodriguez-Guzman PharmD, BCPS 10/10/2022 1:01 PM

## 2022-10-10 NOTE — ED Provider Notes (Signed)
[facility] Provider Note    Event Date/Time   First MD Initiated Contact with Patient 10/10/22 1024     (approximate)   History   Hypotension   HPI  Lauren Morris is a 89 y.o. female with history of hypertension, UTI, vertigo who presents with complaints of weakness.  Patient reports she felt well yesterday, she ambulates with a cane typically.  This morning she felt very weak and could not get out of bed.  She reports this has happened in the past when she has gotten a urinary tract infection.  She denies chest pain.  No shortness of breath, no cough reported.  No fevers.  No neurodeficits     Physical Exam   Triage Vital Signs: ED Triage Vitals  Enc Vitals Group     BP 10/10/22 0952 (!) 87/46     Pulse Rate 10/10/22 0952 63     Resp 10/10/22 0952 17     Temp 10/10/22 0952 98.2 F (36.8 C)     Temp Source 10/10/22 0952 Oral     SpO2 10/10/22 0952 94 %     Weight 10/10/22 0955 71.7 kg (158 lb)     Height 10/10/22 0955 1.575 m ( )     Head Circumference --      Peak Flow --      Pain Score 10/10/22 0955 0     Pain Loc --      Pain Edu? --      Excl. in GC? --     Most recent vital signs: Vitals:   10/10/22 1059 10/10/22 1154  BP: (!) 115/55 (!) 116/56  Pulse: 71 66  Resp: 16 18  Temp: 97.8 F (36.6 C)   SpO2: 94% 94%     General: Awake, no distress.  CV:  Good peripheral perfusion.  Regular rate and rhythm Resp:  Normal effort.  Abd:  No distention.  Soft, nontender Other:  Normal range of motion of all extremities, normal neuroexam   ED Results / Procedures / Treatments   Labs (all labs ordered are listed, but only abnormal results are displayed) Labs Reviewed  COMPREHENSIVE METABOLIC PANEL - Abnormal; Notable for the following components:      Result Value   CO2 21 (*)    Glucose, Bld 184 (*)    Albumin 3.3 (*)    AST 105 (*)    ALT 87 (*)    Alkaline Phosphatase 601 (*)    Total Bilirubin 1.4 (*)    GFR,  Estimated 55 (*)    All other components within normal limits  CBC WITH DIFFERENTIAL/PLATELET - Abnormal; Notable for the following components:   WBC 11.0 (*)    Neutro Abs 8.7 (*)    All other components within normal limits  URINALYSIS, ROUTINE W REFLEX MICROSCOPIC - Abnormal; Notable for the following components:   Color, Urine AMBER (*)    APPearance HAZY (*)    Nitrite POSITIVE (*)    Bacteria, UA MANY (*)    All other components within normal limits  CULTURE, BLOOD (ROUTINE X 2)  CULTURE, BLOOD (ROUTINE X 2)  LACTIC ACID, PLASMA  PROTIME-INR  LACTIC ACID, PLASMA  LACTIC ACID, PLASMA  PROCALCITONIN     EKG  ED ECG REPORT I, Jene Every, the attending physician, personally viewed and interpreted this ECG.  Date: 10/10/2022  Rhythm: normal sinus rhythm QRS Axis: normal Intervals: normal ST/T Wave abnormalities: normal Narrative Interpretation: no evidence of acute  ischemia    RADIOLOGY Chest x-ray viewed interpreted me, no acute abnormality    PROCEDURES:  Critical Care performed:   Procedures   MEDICATIONS ORDERED IN ED: Medications  famotidine (PEPCID) tablet 20 mg (has no administration in time range)  ascorbic acid (VITAMIN C) tablet 1,000 mg (has no administration in time range)  cefTRIAXone (ROCEPHIN) 2 g in sodium chloride 0.9 % 100 mL IVPB (has no administration in time range)  0.9 %  sodium chloride infusion (has no administration in time range)  hydrALAZINE (APRESOLINE) injection 5 mg (has no administration in time range)  acetaminophen (TYLENOL) tablet 650 mg (has no administration in time range)    Or  acetaminophen (TYLENOL) suppository 650 mg (has no administration in time range)  ondansetron (ZOFRAN) tablet 4 mg (has no administration in time range)    Or  ondansetron (ZOFRAN) injection 4 mg (has no administration in time range)  enoxaparin (LOVENOX) injection 40 mg (has no administration in time range)  aspirin EC tablet 81 mg (has  no administration in time range)  albuterol (PROVENTIL) (2.5 MG/3ML) 0.083% nebulizer solution 2.5 mg (has no administration in time range)  sodium chloride 0.9 % bolus 1,000 mL (0 mLs Intravenous Stopped 10/10/22 1154)  cefTRIAXone (ROCEPHIN) 2 g in sodium chloride 0.9 % 100 mL IVPB (0 g Intravenous Stopped 10/10/22 1303)  lactated ringers bolus 1,000 mL (0 mLs Intravenous Stopped 10/10/22 1339)     IMPRESSION / MDM / ASSESSMENT AND PLAN / ED COURSE  I reviewed the triage vital signs and the nursing notes. Patient's presentation is most consistent with acute presentation with potential threat to life or bodily function.  Patient presents with weakness as detailed above, found to be hypotensive in triage.  Differential includes sepsis, dehydration, kidney injury  Will give normal saline IV bolus, obtain labs including lactic, urinalysis, chest x-ray  Lactic acid is normal, mild elevated white blood cell count.  Urinalysis suspicious for urinary tract infection.  IV Rocephin ordered.  Not consistent with sepsis, patient's blood pressure improved with fluids, have discussed with the hospitalist for admission      FINAL CLINICAL IMPRESSION(S) / ED DIAGNOSES   Final diagnoses:  Acute cystitis without hematuria  Hypotension, unspecified hypotension type     Rx / DC Orders   ED Discharge Orders     None        Note:  This document was prepared using Dragon voice recognition software and may include unintentional dictation errors.   Jene Every, MD 10/10/22 1341

## 2022-10-11 DIAGNOSIS — R6510 Systemic inflammatory response syndrome (SIRS) of non-infectious origin without acute organ dysfunction: Secondary | ICD-10-CM | POA: Diagnosis not present

## 2022-10-11 LAB — COMPREHENSIVE METABOLIC PANEL
ALT: 62 U/L — ABNORMAL HIGH (ref 0–44)
AST: 71 U/L — ABNORMAL HIGH (ref 15–41)
Albumin: 2.5 g/dL — ABNORMAL LOW (ref 3.5–5.0)
Alkaline Phosphatase: 439 U/L — ABNORMAL HIGH (ref 38–126)
Anion gap: 7 (ref 5–15)
BUN: 18 mg/dL (ref 8–23)
CO2: 21 mmol/L — ABNORMAL LOW (ref 22–32)
Calcium: 8.3 mg/dL — ABNORMAL LOW (ref 8.9–10.3)
Chloride: 112 mmol/L — ABNORMAL HIGH (ref 98–111)
Creatinine, Ser: 0.71 mg/dL (ref 0.44–1.00)
GFR, Estimated: >60 mL/min (ref >60)
Glucose, Bld: 115 mg/dL — ABNORMAL HIGH (ref 70–99)
Potassium: 3.9 mmol/L (ref 3.5–5.1)
Sodium: 140 mmol/L (ref 135–145)
Total Bilirubin: 0.9 mg/dL (ref 0.3–1.2)
Total Protein: 6.2 g/dL — ABNORMAL LOW (ref 6.5–8.1)

## 2022-10-11 LAB — CBC
HCT: 38.8 % (ref 36.0–46.0)
Hemoglobin: 12.6 g/dL (ref 12.0–15.0)
MCH: 30.2 pg (ref 26.0–34.0)
MCHC: 32.5 g/dL (ref 30.0–36.0)
MCV: 93 fL (ref 80.0–100.0)
Platelets: 157 10*3/uL (ref 150–400)
RBC: 4.17 MIL/uL (ref 3.87–5.11)
RDW: 15.6 % — ABNORMAL HIGH (ref 11.5–15.5)
WBC: 6.6 10*3/uL (ref 4.0–10.5)
nRBC: 0 % (ref 0.0–0.2)

## 2022-10-11 LAB — PROCALCITONIN: Procalcitonin: 0.29 ng/mL

## 2022-10-11 MED ORDER — CYCLOSPORINE 0.05 % OP EMUL
1.0000 [drp] | Freq: Two times a day (BID) | OPHTHALMIC | Status: DC
  Administered 2022-10-11 – 2022-10-13 (×5): 1 [drp] via OPHTHALMIC
  Filled 2022-10-11 (×5): qty 30

## 2022-10-11 MED ORDER — FAMOTIDINE 20 MG PO TABS
20.0000 mg | ORAL_TABLET | Freq: Two times a day (BID) | ORAL | Status: DC
  Administered 2022-10-11 – 2022-10-13 (×4): 20 mg via ORAL
  Filled 2022-10-11 (×4): qty 1

## 2022-10-11 MED ORDER — TIOTROPIUM BROMIDE MONOHYDRATE 18 MCG IN CAPS
18.0000 ug | ORAL_CAPSULE | Freq: Every day | RESPIRATORY_TRACT | Status: DC
  Administered 2022-10-11 – 2022-10-13 (×3): 18 ug via RESPIRATORY_TRACT
  Filled 2022-10-11: qty 5

## 2022-10-11 MED ORDER — SODIUM CHLORIDE 0.9 % IV SOLN: INTRAVENOUS | Status: DC

## 2022-10-11 MED ORDER — FUROSEMIDE 10 MG/ML IJ SOLN
40.0000 mg | Freq: Once | INTRAMUSCULAR | Status: AC
  Administered 2022-10-11: 40 mg via INTRAVENOUS
  Filled 2022-10-11: qty 4

## 2022-10-11 NOTE — Progress Notes (Signed)
PROGRESS NOTE    Lauren Morris  ZOX:[id number] DOB: 09/12/1931 DOA: 10/10/2022 PCP: Sherlene Shams, MD    Brief Narrative:  89 year old female with hypertension, GERD, Graves' disease with history of hypothyroid, OSA on CPAP, who presents emergency department for chief concerns of weakness and dysuria. She reports that this morning she felt too weak to get up from bed.  She reports that at baseline she performs her own ADLs.  She does her own cooking and loads the dishes in the dishwasher.  She does not really clean the house as she has a helper that occasionally comes to help clean her home.   She denies chest pain, shortness of breath, current swelling of her lower extremities, nausea, vomiting, fever, cough.  She endorses chills.  She denies abdominal pain, syncope, loss of consciousness.  She denies dysuria, hematuria, changes in urinary habits that she is aware of.  Patient endorses feeling better after administration of intravenous fluids and initiation of IV antibiotics.  Interestingly the urinalysis is nitrite positive but negative for pyuria.  Unclear etiology.  Will treat empirically for uncomplicated cystitis.  Assessment & Plan:   Principal Problem:   SIRS (systemic inflammatory response syndrome) Active Problems:   Essential hypertension   Generalized weakness   Obesity, diabetes, and hypertension syndrome   Do not resuscitate status   OSA on CPAP   Elevated liver enzymes  Severe sepsis secondary to UTI Presumed source urine.  Patient with mild hypotension responsive to IV fluids.  Increased liver enzymes presumably secondary to sepsis Interestingly urine is nitrite positive but pyuria negative Plan: Continue ceftriaxone 2 g IV daily, last dose 4/19 Hold IV fluids for now Encourage p.o. fluid intake Monitor cultures   Elevated liver enzymes Presumed secondary to SIRS/sepsis, completed by history of nonalcoholic liver cirrhosis, will continue to fluid  LFTs  downtrending   OSA on CPAP CPAP nightly ordered   Do not resuscitate status DNR form uploaded to [employer]   Obesity, diabetes, and hypertension syndrome This complicates overall care and prognosis.    Generalized weakness Fall precautions   Essential hypertension Slowly restart antihypertensive regimen   DVT prophylaxis: SQ Lovenox Code Status: DNR Family Communication: Daughter at bedside 4/18 Disposition Plan: Status is: Inpatient Remains inpatient appropriate because: UTI on IV antibiotics   Level of care: Telemetry Medical  Consultants:  None  Procedures:  None  Antimicrobials: Ceftriaxone   Subjective: Seen and examined.  Reports improvement in symptoms since admission.  Objective: Vitals:   10/10/22 2221 10/10/22 2322 10/11/22 0352 10/11/22 0850  BP: 127/67 (!) 118/50 (!) 117/43 (!) 158/104  Pulse: 63 67 69 78  Resp:  Temp:  98.2 F (36.8 C) 98.2 F (36.8 C) (!) 97.5 F (36.4 C)  TempSrc:    Oral  SpO2:  100% 94% 98%  Weight:      Height:        Intake/Output Summary (Last 24 hours) at 10/11/2022 1150 Last data filed at 10/11/2022 0300 Gross per 24 hour  Intake 2112.62 ml  Output 300 ml  Net 1812.62 ml   Filed Weights   10/10/22 0955  Weight: 71.7 kg    Examination:  General exam: NAD.  Frail Respiratory system: Scattered crackles bilaterally.  Normal work of breathing.  Room air Cardiovascular system: S1-S2, RRR, no murmurs, no pedal edema Gastrointestinal system: Soft, NT/ND, normal bowel sounds Central nervous system: Alert.  X 2.  No focal deficits Extremities: Symmetric 5 x 5 power.  Skin: No rashes, lesions or ulcers Psychiatry: Judgement and insight appear normal. Mood & affect appropriate.     Data Reviewed: I have personally reviewed following labs and imaging studies  CBC: Recent Labs  Lab 10/10/22 1009 10/11/22 0548  WBC 11.0* 6.6  NEUTROABS 8.7*  --   HGB 14.3 12.6  HCT 45.1 38.8  MCV 95.1 93.0   PLT 205 157   Basic Metabolic Panel: Recent Labs  Lab 10/10/22 1009 10/11/22 0548  NA 136 140  K 3.8 3.9  CL 106 112*  CO2 21* 21*  GLUCOSE 184* 115*  BUN 23 18  CREATININE 0.99 0.71  CALCIUM 8.9 8.3*   GFR: Estimated Creatinine Clearance: 45 mL/min (by C-G formula based on SCr of 0.71 mg/dL). Liver Function Tests: Recent Labs  Lab 10/10/22 1009 10/11/22 0548  AST 105* 71*  ALT 87* 62*  ALKPHOS 601* 439*  BILITOT 1.4* 0.9  PROT 8.0 6.2*  ALBUMIN 3.3* 2.5*   No results for input(s): "LIPASE", "AMYLASE" in the last 168 hours. No results for input(s): "AMMONIA" in the last 168 hours. Coagulation Profile: Recent Labs  Lab 10/10/22 1009  INR 1.2   Cardiac Enzymes: No results for input(s): "CKTOTAL", "CKMB", "CKMBINDEX", "TROPONINI" in the last 168 hours. BNP (last 3 results) No results for input(s): "PROBNP" in the last 8760 hours. HbA1C: No results for input(s): "HGBA1C" in the last 72 hours. CBG: No results for input(s): "GLUCAP" in the last 168 hours. Lipid Profile: No results for input(s): "CHOL", "HDL", "LDLCALC", "TRIG", "CHOLHDL", "LDLDIRECT" in the last 72 hours. Thyroid Function Tests: No results for input(s): "TSH", "T4TOTAL", "FREET4", "T3FREE", "THYROIDAB" in the last 72 hours. Anemia Panel: No results for input(s): "VITAMINB12", "FOLATE", "FERRITIN", "TIBC", "IRON", "RETICCTPCT" in the last 72 hours. Sepsis Labs: Recent Labs  Lab 10/10/22 1009 10/10/22 1255 10/11/22 0548  PROCALCITON  --  0.30 0.29  LATICACIDVEN 1.7 1.4  --     No results found for this or any previous visit (from the past 240 hour(s)).       Radiology Studies: DG Chest Port 1 View  Result Date: 10/10/2022 CLINICAL DATA:  Sepsis EXAM: PORTABLE CHEST 1 VIEW COMPARISON:  Radiograph 11/12/2021 FINDINGS: Unchanged mildly enlarged cardiac silhouette. There is no new focal airspace disease. There is no large effusion or evidence of pneumothorax. No acute osseous abnormality.  Thoracic spondylosis. IMPRESSION: Unchanged mildly enlarged cardiac silhouette. No new focal airspace disease. Electronically Signed   By: Caprice Renshaw M.D.   On: 10/10/2022 10:32        Scheduled Meds:  vitamin C  1,000 mg Oral Daily   aspirin EC  81 mg Oral Daily   dorzolamide-timolol  1 drop Both Eyes BID   enoxaparin (LOVENOX) injection  40 mg Subcutaneous Q24H   famotidine  20 mg Oral BID   furosemide  40 mg Intravenous Once   multivitamin with minerals  1 tablet Oral Daily   omega-3 acid ethyl esters  2 g Oral Daily   polyvinyl alcohol  1 drop Both Eyes TID   tiotropium  18 mcg Inhalation Daily   valACYclovir  500 mg Oral QHS   zinc sulfate  220 mg Oral Daily   Continuous Infusions:  cefTRIAXone (ROCEPHIN)  IV       LOS: 1 day      Tresa Moore, MD [facility]   If 7PM-7AM, please contact night-coverage  10/11/2022, 11:50 AM

## 2022-10-11 NOTE — Evaluation (Signed)
Physical Therapy Evaluation Patient Details Name: Lauren Morris MRN: [id number] DOB: 1932/05/28 Today's Date: 10/11/2022  History of Present Illness  89 year old female with hypertension, GERD, Graves' disease with history of hypothyroid, OSA on CPAP, who presents emergency department for chief concerns of weakness and dysuria. She reports that this morning she felt too weak to get up from bed.  She reports that at baseline she performs her own ADLs.  She does her own cooking and loads the dishes in the dishwasher.  She does not really clean the house as she has a helper that occasionally comes to help clean her home.   Clinical Impression  Patient alert agreeable to PT. Reported at baseline she lives alone and is modI/I for mobility and ADLs. She was able to perform supine to sit with modA, and to return to supine minA. Fair sitting balance. Sit <> stand performed at least 5 times this session due to urinary incontinence. Pt needed assistance for pericare, donning/doffing underwear. Unable to truly ambulate at this time due to incontinence but did display fatigue with activity.  Overall the patient demonstrated deficits (see "PT Problem List") that impede the patient's functional abilities, safety, and mobility and would benefit from skilled PT intervention. Recommendation is to maximize function and safety to return to PLOF.        Recommendations for follow up therapy are one component of a multi-disciplinary discharge planning process, led by the attending physician.  Recommendations may be updated based on patient status, additional functional criteria and insurance authorization.  Follow Up Recommendations Can patient physically be transported by private vehicle: Yes     Assistance Recommended at Discharge Frequent or constant Supervision/Assistance  Patient can return home with the following  A lot of help with walking and/or transfers;A lot of help with bathing/dressing/bathroom;Assistance  with cooking/housework;Direct supervision/assist for medications management;Assist for transportation;Help with stairs or ramp for entrance    Equipment Recommendations Other (comment) (TBD)  Recommendations for Other Services       Functional Status Assessment Patient has had a recent decline in their functional status and demonstrates the ability to make significant improvements in function in a reasonable and predictable amount of time.     Precautions / Restrictions Precautions Precautions: Fall Precaution Comments: O2 saturation Restrictions Weight Bearing Restrictions: No      Mobility  Bed Mobility Overal bed mobility: Needs Assistance Bed Mobility: Supine to Sit, Sit to Supine     Supine to sit: Mod assist, HOB elevated Sit to supine: Min assist, HOB elevated        Transfers Overall transfer level: Needs assistance Equipment used: Rolling walker (2 wheels) Transfers: Sit to/from Stand, Bed to chair/wheelchair/BSC Sit to Stand: Min assist           General transfer comment: min A progressing to min guard    Ambulation/Gait               General Gait Details: unable do to incontinence  Stairs            Wheelchair Mobility    Modified Rankin (Stroke Patients Only)       Balance Overall balance assessment: Needs assistance Sitting-balance support: Feet supported Sitting balance-Leahy Scale: Good     Standing balance support: Reliant on assistive device for balance, Bilateral upper extremity supported Standing balance-Leahy Scale: Fair  Pertinent Vitals/Pain Pain Assessment Pain Assessment: No/denies pain    Home Living Family/patient expects to be discharged to:: Private residence Living Arrangements: Alone Available Help at Discharge: Family;Available 24 hours/day Type of Home: House Home Access: Stairs to enter Entrance Stairs-Rails: Right Entrance Stairs-Number of Steps: 2    Home Layout: One level Home Equipment: Agricultural consultant (2 wheels);Cane - single point;Shower seat - built in;BSC/3in1      Prior Function Prior Level of Function : Independent/Modified Independent             Mobility Comments: Pt reports using SPC for functional mobility ADLs Comments: Mod I at baseline and very active. She does not drive so family assists with that. She does her own cooking and cleaning but has someone come to deep clean.     Hand Dominance   Dominant Hand: Right    Extremity/Trunk Assessment   Upper Extremity Assessment Upper Extremity Assessment: Defer to OT evaluation    Lower Extremity Assessment Lower Extremity Assessment: Generalized weakness    Cervical / Trunk Assessment Cervical / Trunk Assessment: Normal  Communication   Communication: No difficulties  Cognition Arousal/Alertness: Awake/alert Behavior During Therapy: WFL for tasks assessed/performed Overall Cognitive Status: Within Functional Limits for tasks assessed                                          General Comments      Exercises Other Exercises Other Exercises: pt with urinary incontinence throughout mobility. ADLs (underwear change, BSC transfers, gown change, majority of session)   Assessment/Plan    PT Assessment Patient needs continued PT services  PT Problem List Decreased strength;Decreased mobility;Decreased range of motion;Decreased activity tolerance;Decreased balance;Pain;Decreased knowledge of use of DME       PT Treatment Interventions DME instruction;Therapeutic activities;Gait training;Therapeutic exercise;Patient/family education;Stair training;Balance training;Functional mobility training;Neuromuscular re-education    PT Goals (Current goals can be found in the Care Plan section)  Acute Rehab PT Goals Patient Stated Goal: to go home PT Goal Formulation: With patient Time For Goal Achievement: 10/25/22 Potential to Achieve Goals:  Good    Frequency Min 4X/week     Co-evaluation               AM-PAC PT "6 Clicks" Mobility  Outcome Measure Help needed turning from your back to your side while in a flat bed without using bedrails?: A Little Help needed moving from lying on your back to sitting on the side of a flat bed without using bedrails?: A Little Help needed moving to and from a bed to a chair (including a wheelchair)?: A Little Help needed standing up from a chair using your arms (e.g., wheelchair or bedside chair)?: A Little Help needed to walk in hospital room?: A Little Help needed climbing 3-5 steps with a railing? : A Lot 6 Click Score: 17    End of Session Equipment Utilized During Treatment: Gait belt Activity Tolerance: Patient tolerated treatment well Patient left: in bed;with call bell/phone within reach;with bed alarm set Nurse Communication: Mobility status PT Visit Diagnosis: Other abnormalities of gait and mobility (R26.89);Muscle weakness (generalized) (M62.81)    Time: 9604-5409 PT Time Calculation (min) (ACUTE ONLY): 32 min   Charges:   PT Evaluation $PT Eval Low Complexity: 1 Low PT Treatments $Therapeutic Activity: 23-37 mins        Olga Coaster PT, DPT 4:04 PM,10/11/22

## 2022-10-11 NOTE — Evaluation (Signed)
Occupational Therapy Evaluation Patient Details Name: Lauren Morris MRN: [id number] DOB: 1931/11/09 Today's Date: 10/11/2022   History of Present Illness 89 year old female with hypertension, GERD, Graves' disease with history of hypothyroid, OSA on CPAP, who presents emergency department for chief concerns of weakness and dysuria. She reports that this morning she felt too weak to get up from bed.  She reports that at baseline she performs her own ADLs.  She does her own cooking and loads the dishes in the dishwasher.  She does not really clean the house as she has a helper that occasionally comes to help clean her home.   Clinical Impression   Patient presenting with decreased Ind in self care,balance, functional mobility/transfers, endurance, and safety awareness.  Patient reports being mod I at baseline with use of SPC for mobility. She lives alone and is very active with several family members available to assist at discharge. Pt's O2 saturation is 86% on RA while seated on EOB. Pt needing to be placed on 3Ls via [place]  in order to stand and take several steps in room and remain at or above 93%. Pt reports feeling very congested today. Patient will benefit from acute OT to increase overall independence in the areas of ADLs, functional mobility,and safety awareness in order to safely discharge.     Recommendations for follow up therapy are one component of a multi-disciplinary discharge planning process, led by the attending physician.  Recommendations may be updated based on patient status, additional functional criteria and insurance authorization.   Assistance Recommended at Discharge Intermittent Supervision/Assistance  Patient can return home with the following A little help with walking and/or transfers;A little help with bathing/dressing/bathroom;Assistance with cooking/housework;Assist for transportation;Help with stairs or ramp for entrance    Functional Status Assessment  Patient has had  a recent decline in their functional status and demonstrates the ability to make significant improvements in function in a reasonable and predictable amount of time.  Equipment Recommendations  None recommended by OT           Mobility Bed Mobility Overal bed mobility: Modified Independent                  Transfers Overall transfer level: Needs assistance Equipment used: Rolling walker (2 wheels) Transfers: Sit to/from Stand, Bed to chair/wheelchair/BSC Sit to Stand: Min guard, Min assist           General transfer comment: min A progressing to min guard      Balance Overall balance assessment: Needs assistance Sitting-balance support: Feet supported Sitting balance-Leahy Scale: Good     Standing balance support: Reliant on assistive device for balance, Bilateral upper extremity supported Standing balance-Leahy Scale: Fair                             ADL either performed or assessed with clinical judgement   ADL Overall ADL's : Needs assistance/impaired     Grooming: Wash/dry hands;Wash/dry face;Sitting;Supervision/safety;Set up                   Toilet Transfer: Min guard;Minimal assistance;BSC/3in1;Rolling walker (2 wheels) Toilet Transfer Details (indicate cue type and reason): simulated         Functional mobility during ADLs: Min guard;Minimal assistance;Rolling walker (2 wheels)       Vision Patient Visual Report: No change from baseline              Pertinent Vitals/Pain Pain Assessment Pain  Assessment: No/denies pain     Hand Dominance Right   Extremity/Trunk Assessment Upper Extremity Assessment Upper Extremity Assessment: Generalized weakness;Overall [facility] for tasks assessed   Lower Extremity Assessment Lower Extremity Assessment: Defer to PT evaluation       Communication Communication Communication: No difficulties   Cognition Arousal/Alertness: Awake/alert Behavior During Therapy: WFL for tasks  assessed/performed Overall Cognitive Status: Within Functional Limits for tasks assessed                                                  Home Living Family/patient expects to be discharged to:: Private residence Living Arrangements: Alone Available Help at Discharge: Family;Available 24 hours/day Type of Home: House Home Access: Stairs to enter [employer] of Steps: 2 Entrance Stairs-Rails: Right Home Layout: One level     Bathroom Shower/Tub: Walk-in shower;Sponge bathes at baseline   [employer]: Standard     Home Equipment: Agricultural consultant (2 wheels);Cane - single point;Shower seat - built in;BSC/3in1          Prior Functioning/Environment Prior Level of Function : Independent/Modified Independent             Mobility Comments: Pt reports using SPC for functional mobility ADLs Comments: Mod I at baseline and very active. She does not drive so family assists with that. She does her own cooking and cleaning but has someone come to deep clean.        OT Problem List: Decreased strength;Decreased range of motion;Decreased cognition;Decreased activity tolerance;Decreased safety awareness;Impaired balance (sitting and/or standing);Decreased knowledge of use of DME or AE      OT Treatment/Interventions: Self-care/ADL training;Therapeutic exercise;Therapeutic activities;Energy conservation;DME and/or AE instruction;Patient/family education;Balance training    OT Goals(Current goals can be found in the care plan section) Acute Rehab OT Goals Patient Stated Goal: to go home OT Goal Formulation: With patient/family Time For Goal Achievement: 10/25/22 Potential to Achieve Goals: Fair ADL Goals Pt Will Perform Grooming: with modified independence;standing Pt Will Perform Lower Body Dressing: with supervision;sit to/from stand Pt Will Transfer to Toilet: with supervision;ambulating Pt Will Perform Toileting - Clothing Manipulation and  hygiene: with supervision;sit to/from stand  OT Frequency: Min 1X/week       AM-PAC OT "6 Clicks" Daily Activity     Outcome Measure Help from another person eating meals?: None Help from another person taking care of personal grooming?: A Little Help from another person toileting, which includes using toliet, bedpan, or urinal?: A Lot Help from another person bathing (including washing, rinsing, drying)?: A Lot Help from another person to put on and taking off regular upper body clothing?: A Little Help from another person to put on and taking off regular lower body clothing?: A Lot 6 Click Score: 16   End of Session Equipment Utilized During Treatment: Rolling walker (2 wheels) Nurse Communication: Mobility status;Other (comment) (O2 requirement need-now on 3Ls)  Activity Tolerance: Patient limited by fatigue Patient left: in bed;with call bell/phone within reach;with bed alarm set;with family/visitor present  OT Visit Diagnosis: Unsteadiness on feet (R26.81);Muscle weakness (generalized) (M62.81);History of falling (Z91.81)                Time: 0865-7846 OT Time Calculation (min): 23 min Charges:  OT General Charges $OT Visit: 1 Visit OT Evaluation $OT Eval Moderate Complexity: 1 Mod OT Treatments $Therapeutic Activity: 8-22 mins Jackquline Denmark, MS,  OTR/L , CBIS ascom [phone]  10/11/22, 2:58 PM

## 2022-10-12 ENCOUNTER — Inpatient Hospital Stay: Payer: Medicare Other

## 2022-10-12 DIAGNOSIS — R6510 Systemic inflammatory response syndrome (SIRS) of non-infectious origin without acute organ dysfunction: Secondary | ICD-10-CM | POA: Diagnosis not present

## 2022-10-12 LAB — BRAIN NATRIURETIC PEPTIDE: B Natriuretic Peptide: 140.1 pg/mL — ABNORMAL HIGH (ref 0.0–100.0)

## 2022-10-12 MED ORDER — FUROSEMIDE 10 MG/ML IJ SOLN
40.0000 mg | Freq: Once | INTRAMUSCULAR | Status: AC
  Administered 2022-10-12: 40 mg via INTRAVENOUS
  Filled 2022-10-12: qty 4

## 2022-10-12 MED ORDER — POLYETHYLENE GLYCOL 3350 17 G PO PACK
17.0000 g | PACK | Freq: Every day | ORAL | Status: DC | PRN
  Filled 2022-10-12: qty 1

## 2022-10-12 MED ORDER — GUAIFENESIN ER 600 MG PO TB12
600.0000 mg | ORAL_TABLET | Freq: Two times a day (BID) | ORAL | Status: DC
  Administered 2022-10-12 – 2022-10-13 (×3): 600 mg via ORAL
  Filled 2022-10-12 (×3): qty 1

## 2022-10-12 MED ORDER — GUAIFENESIN-DM 100-10 MG/5ML PO SYRP
5.0000 mL | ORAL_SOLUTION | ORAL | Status: DC | PRN
  Administered 2022-10-12: 5 mL via ORAL
  Filled 2022-10-12: qty 10

## 2022-10-12 NOTE — Progress Notes (Signed)
Physical Therapy Treatment Patient Details Name: Lauren Morris MRN: [id number] DOB: 10-Oct-1931 Today's Date: 10/12/2022   History of Present Illness 89 year old female with hypertension, GERD, Graves' disease with history of hypothyroid, OSA on CPAP, who presents emergency department for chief concerns of weakness and dysuria. She reports that this morning she felt too weak to get up from bed.  She reports that at baseline she performs her own ADLs.  She does her own cooking and loads the dishes in the dishwasher.  She does not really clean the house as she has a helper that occasionally comes to help clean her home.    PT Comments    Patient alert, with family at bedside, denied pain. Trialed on room air and able to maintains spO2 >90% throughout session, RN notified. The patient put good effort into supine to sit transfer but ultimately required modA and reliance on bed rails.  Sit <> stand twice during session, CGA with RW but very effortful for the patient. She ambulated ~69ft total but with very slow, shuffled step and RW, close chair follow, fatigued quickly and some SOB noted. Pt up in recliner at end of session with all needs in reach. The patient would benefit from further skilled PT intervention to continue to progress towards goals.    Recommendations for follow up therapy are one component of a multi-disciplinary discharge planning process, led by the attending physician.  Recommendations may be updated based on patient status, additional functional criteria and insurance authorization.  Follow Up Recommendations  Can patient physically be transported by private vehicle: Yes    Assistance Recommended at Discharge Frequent or constant Supervision/Assistance  Patient can return home with the following A lot of help with walking and/or transfers;A lot of help with bathing/dressing/bathroom;Assistance with cooking/housework;Direct supervision/assist for medications management;Assist for  transportation;Help with stairs or ramp for entrance   Equipment Recommendations  Other (comment) (TBD)    Recommendations for Other Services       Precautions / Restrictions Precautions Precautions: Fall Precaution Comments: O2 saturation Restrictions Weight Bearing Restrictions: No     Mobility  Bed Mobility Overal bed mobility: Needs Assistance Bed Mobility: Supine to Sit     Supine to sit: HOB elevated, Mod assist          Transfers Overall transfer level: Needs assistance Equipment used: Rolling walker (2 wheels) Transfers: Sit to/from Stand Sit to Stand: Min guard           General transfer comment: very slow    Ambulation/Gait   Gait Distance (Feet): 8 Feet           General Gait Details: very slow, pt fatigued   Stairs             Wheelchair Mobility    Modified Rankin (Stroke Patients Only)       Balance Overall balance assessment: Needs assistance Sitting-balance support: Feet supported Sitting balance-Leahy Scale: Good     Standing balance support: Reliant on assistive device for balance, Bilateral upper extremity supported Standing balance-Leahy Scale: Fair                              Cognition Arousal/Alertness: Awake/alert Behavior During Therapy: WFL for tasks assessed/performed Overall Cognitive Status: Within Functional Limits for tasks assessed  Exercises      General Comments        Pertinent Vitals/Pain Pain Assessment Pain Assessment: No/denies pain    Home Living                          Prior Function            PT Goals (current goals can now be found in the care plan section) Progress towards PT goals: Progressing toward goals    Frequency    Min 4X/week      PT Plan Current plan remains appropriate    Co-evaluation              AM-PAC PT "6 Clicks" Mobility   Outcome Measure  Help needed  turning from your back to your side while in a flat bed without using bedrails?: A Little Help needed moving from lying on your back to sitting on the side of a flat bed without using bedrails?: A Little Help needed moving to and from a bed to a chair (including a wheelchair)?: A Little Help needed standing up from a chair using your arms (e.g., wheelchair or bedside chair)?: A Little Help needed to walk in hospital room?: A Little Help needed climbing 3-5 steps with a railing? : A Lot 6 Click Score: 17    End of Session Equipment Utilized During Treatment: Gait belt Activity Tolerance: Patient tolerated treatment well Patient left: in bed;with call bell/phone within reach;with bed alarm set Nurse Communication: Mobility status PT Visit Diagnosis: Other abnormalities of gait and mobility (R26.89);Muscle weakness (generalized) (M62.81)     Time: 1610-9604 PT Time Calculation (min) (ACUTE ONLY): 32 min  Charges:  $Therapeutic Activity: 23-37 mins                     Olga Coaster PT, DPT 11:43 AM,10/12/22

## 2022-10-12 NOTE — Evaluation (Signed)
Occupational Therapy Evaluation Patient Details Name: Lauren Morris MRN: [id number] DOB: 07-29-1931 Today's Date: 10/12/2022   History of Present Illness 89 year old female with hypertension, GERD, Graves' disease with history of hypothyroid, OSA on CPAP, who presents emergency department for chief concerns of weakness and dysuria. She reports that this morning she felt too weak to get up from bed.  She reports that at baseline she performs her own ADLs.  She does her own cooking and loads the dishes in the dishwasher.  She does not really clean the house as she has a helper that occasionally comes to help clean her home.   Clinical Impression   Upon entering the room, pt supine in bed on RA. O2 saturation remains at 93% while supine in bed. OT provided pt with incentive spirometer and pt needing min cuing for proper technique. She performs x 10 reps this session. Pt reports feeling very fatigued and having just recently returned to bed from recliner chair. Pt declined OOB activity secondary to fatigue but bridges in bed x 3 reps for repositioning in bed. Call bell and all needed items within reach upon exiting the room.      Recommendations for follow up therapy are one component of a multi-disciplinary discharge planning process, led by the attending physician.  Recommendations may be updated based on patient status, additional functional criteria and insurance authorization.   Assistance Recommended at Discharge Intermittent Supervision/Assistance  Patient can return home with the following A little help with walking and/or transfers;A little help with bathing/dressing/bathroom;Assistance with cooking/housework;Assist for transportation;Help with stairs or ramp for entrance       Equipment Recommendations  None recommended by OT       Precautions / Restrictions Precautions Precautions: Fall Precaution Comments: O2 saturation Restrictions Weight Bearing Restrictions: No      Mobility  Bed Mobility                    Transfers                              ADL either performed or assessed with clinical judgement      Vision Patient Visual Report: No change from baseline              Pertinent Vitals/Pain Pain Assessment Pain Assessment: No/denies pain     Extremity/Trunk Assessment Upper Extremity Assessment Upper Extremity Assessment: Generalized weakness   Lower Extremity Assessment Lower Extremity Assessment: Generalized weakness          Cognition Arousal/Alertness: Awake/alert Behavior During Therapy: WFL for tasks assessed/performed Overall Cognitive Status: Within Functional Limits for tasks assessed                                         OT Frequency: Min 1X/week       AM-PAC OT "6 Clicks" Daily Activity     Outcome Measure Help from another person eating meals?: None Help from another person taking care of personal grooming?: A Little Help from another person toileting, which includes using toliet, bedpan, or urinal?: A Lot Help from another person bathing (including washing, rinsing, drying)?: A Lot Help from another person to put on and taking off regular upper body clothing?: A Little Help from another person to put on and taking off regular lower body clothing?: A Lot 6 Click Score: 16  End of Session Equipment Utilized During Treatment: Rolling walker (2 wheels) Nurse Communication: Mobility status  Activity Tolerance: Patient limited by fatigue Patient left: in bed;with call bell/phone within reach;with bed alarm set;with family/visitor present  OT Visit Diagnosis: Unsteadiness on feet (R26.81);Muscle weakness (generalized) (M62.81);History of falling (Z91.81)                Time: 6644-0347 OT Time Calculation (min): 16 min Charges:  OT General Charges $OT Visit: 1 Visit OT Treatments $Therapeutic Activity: 8-22 mins  Jackquline Denmark, MS, OTR/L , CBIS ascom [phone]   10/12/22, 3:52 PM

## 2022-10-12 NOTE — [place] FL2 (Signed)
[place] MEDICAID FL2 LEVEL OF CARE FORM     IDENTIFICATION  Patient Name: Lauren Morris Birthdate: 05-16-1932 Sex: female Admission Date (Current Location): 10/10/2022  [address] [employer] and [place] Number:  Chiropodist and Address:  [facility], [address] [place], [place] [address]      Provider Number: [id number]  Attending Physician Name and Address:  Tresa Moore, MD  Relative Name and Phone Number:  Marlaine Hind (Daughter) [phone]    Current Level of Care: Hospital Recommended Level of Care: Skilled Nursing Facility Prior Approval Number:    Date Approved/Denied:   PASRR Number: [id number]  Discharge Plan:      Current Diagnoses: Patient Active Problem List   Diagnosis Date Noted   SIRS (systemic inflammatory response syndrome) 10/10/2022   OSA on CPAP 10/10/2022   Elevated liver enzymes 10/10/2022   Graves disease 02/16/2022   UTI (urinary tract infection) 11/12/2021   Dizziness 08/03/2021   Do not resuscitate status 08/03/2021   Musculoskeletal back pain 08/03/2021   Meningioma 08/03/2021   Weakness 08/02/2021   Vertigo 08/02/2021   Hospital discharge follow-up 12/12/2020   Personal history of PE (pulmonary embolism) 12/12/2020   Obesity, diabetes, and hypertension syndrome 11/24/2020   Chronic bronchitis 01/16/2020   Abnormal EKG 07/07/2019   Generalized weakness 07/07/2019   H/O: hypothyroidism 07/07/2019   Abnormal chest CT 05/25/2016   Fatty liver disease, nonalcoholic 10/09/2014   Do not resuscitate discussion 03/11/2012   Hyperlipidemia LDL goal <100 04/15/2011   Essential hypertension 02/22/2011   Acquired hypothyroidism 02/22/2011    Orientation RESPIRATION BLADDER Height & Weight     Self, Situation, Time, Place  Normal Continent Weight: 158 lb (71.7 kg) Height:   (157.5 cm)  BEHAVIORAL SYMPTOMS/MOOD NEUROLOGICAL BOWEL NUTRITION STATUS      Continent Diet  AMBULATORY STATUS  COMMUNICATION OF NEEDS Skin     Verbally Normal                       Personal Care Assistance Level of Assistance              Functional Limitations Info  Sight, Speech, Hearing Sight Info: Impaired Hearing Info: Adequate Speech Info: Adequate    SPECIAL CARE FACTORS FREQUENCY  PT (By licensed PT), OT (By licensed OT)     PT Frequency: 5 Times a week OT Frequency: 5 times a week            Contractures Contractures Info: Not present    Additional Factors Info  Code Status Code Status Info: DNR             Current Medications (10/12/2022):  This is the current hospital active medication list Current Facility-Administered Medications  Medication Dose Route Frequency Provider Last Rate Last Admin   acetaminophen (TYLENOL) tablet 650 mg  650 mg Oral Q6H PRN Cox, Amy N, DO   650 mg at 10/12/22 1001   Or   acetaminophen (TYLENOL) suppository 650 mg  650 mg Rectal Q6H PRN Cox, Amy N, DO       albuterol (PROVENTIL) (2.5 MG/3ML) 0.083% nebulizer solution 2.5 mg  2.5 mg Nebulization Q6H PRN Cox, Amy N, DO       ascorbic acid (VITAMIN C) tablet 1,000 mg  1,000 mg Oral Daily Cox, Amy N, DO   1,000 mg at 10/12/22 8469   aspirin EC tablet 81 mg  81 mg Oral Daily Cox, Amy N, DO  81 mg at 10/12/22 0955   cycloSPORINE (RESTASIS) 0.05 % ophthalmic emulsion 1 drop  1 drop Both Eyes BID Lolita Patella B, MD   1 drop at 10/12/22 0957   dorzolamide-timolol (COSOPT) 2-0.5 % ophthalmic solution 1 drop  1 drop Both Eyes BID Cox, Amy N, DO   1 drop at 10/12/22 0958   enoxaparin (LOVENOX) injection 40 mg  40 mg Subcutaneous Q24H Cox, Amy N, DO   40 mg at 10/11/22 2141   famotidine (PEPCID) tablet 20 mg  20 mg Oral BID Jaynie Bream, RPH   20 mg at 10/12/22 1610   guaiFENesin (MUCINEX) 12 hr tablet 600 mg  600 mg Oral BID Lolita Patella B, MD   600 mg at 10/12/22 1211   guaiFENesin-dextromethorphan (ROBITUSSIN DM) 100-10 MG/5ML syrup 5 mL  5 mL Oral Q4H PRN Lolita Patella B, MD   5 mL at 10/12/22 1202   hydrALAZINE (APRESOLINE) injection 5 mg  5 mg Intravenous Q8H PRN Cox, Amy N, DO       melatonin tablet 5 mg  5 mg Oral QHS PRN Cox, Amy N, DO       multivitamin with minerals tablet 1 tablet  1 tablet Oral Daily Cox, Amy N, DO   1 tablet at 10/12/22 9604   omega-3 acid ethyl esters (LOVAZA) capsule 2 g  2 g Oral Daily Cox, Amy N, DO   2 g at 10/12/22 0956   ondansetron (ZOFRAN) tablet 4 mg  4 mg Oral Q6H PRN Cox, Amy N, DO       Or   ondansetron (ZOFRAN) injection 4 mg  4 mg Intravenous Q6H PRN Cox, Amy N, DO       polyethylene glycol (MIRALAX / GLYCOLAX) packet 17 g  17 g Oral Daily PRN Sreenath, Sudheer B, MD       polyvinyl alcohol (LIQUIFILM TEARS) 1.4 % ophthalmic solution 1 drop  1 drop Both Eyes TID Cox, Amy N, DO   1 drop at 10/12/22 0959   tiotropium (SPIRIVA) inhalation capsule ([facility] use ONLY) 18 mcg  18 mcg Inhalation Daily Jaynie Bream, RPH   18 mcg at 10/12/22 0955   valACYclovir (VALTREX) tablet 500 mg  500 mg Oral QHS Cox, Amy N, DO   500 mg at 10/11/22 2141   zinc sulfate capsule 220 mg  220 mg Oral Daily Cox, Amy N, DO   220 mg at 10/12/22 5409     Discharge Medications: Please see discharge summary for a list of discharge medications.  Relevant Imaging Results:  Relevant Lab Results:   Additional Information [id number]  Allena Katz, LCSW

## 2022-10-12 NOTE — Progress Notes (Signed)
PROGRESS NOTE    Lauren Morris  WUJ:[id number] DOB: May 01, 1932 DOA: 10/10/2022 PCP: Sherlene Shams, MD    Brief Narrative:  89 year old female with hypertension, GERD, Graves' disease with history of hypothyroid, OSA on CPAP, who presents emergency department for chief concerns of weakness and dysuria. She reports that this morning she felt too weak to get up from bed.  She reports that at baseline she performs her own ADLs.  She does her own cooking and loads the dishes in the dishwasher.  She does not really clean the house as she has a helper that occasionally comes to help clean her home.   She denies chest pain, shortness of breath, current swelling of her lower extremities, nausea, vomiting, fever, cough.  She endorses chills.  She denies abdominal pain, syncope, loss of consciousness.  She denies dysuria, hematuria, changes in urinary habits that she is aware of.  Patient endorses feeling better after administration of intravenous fluids and initiation of IV antibiotics.  Interestingly the urinalysis is nitrite positive but negative for pyuria.  Unclear etiology.  Will treat empirically for uncomplicated cystitis.  Assessment & Plan:   Principal Problem:   SIRS (systemic inflammatory response syndrome) Active Problems:   Essential hypertension   Generalized weakness   Obesity, diabetes, and hypertension syndrome   Do not resuscitate status   OSA on CPAP   Elevated liver enzymes  Severe sepsis secondary to UTI Presumed source urine.  Patient with mild hypotension responsive to IV fluids.  Increased liver enzymes presumably secondary to sepsis Interestingly urine is nitrite positive but pyuria negative Plan: Continue ceftriaxone 2 g IV daily, last dose today 4/19 Hold IV fluids for now Encourage p.o. fluid intake Monitor cultures  Pulmonary edema Occurred after aggressive IVF  No documented h/o HCF Plan: Lasix  IV x 1 Check BNP 2D echo   Elevated liver  enzymes Presumed secondary to SIRS/sepsis, completed by history of nonalcoholic liver cirrhosis, will continue to fluid  LFTs downtrending   OSA on CPAP CPAP nightly ordered   Do not resuscitate status DNR form uploaded to [employer]   Obesity, diabetes, and hypertension syndrome This complicates overall care and prognosis.    Generalized weakness Fall precautions   Essential hypertension Slowly restart antihypertensive regimen   DVT prophylaxis: SQ Lovenox Code Status: DNR Family Communication: Daughter at bedside 4/18, 4/19 Disposition Plan: Status is: Inpatient Remains inpatient appropriate because: UTI on IV antibiotics   Level of care: Telemetry Medical  Consultants:  None  Procedures:  None  Antimicrobials:   Subjective: Seen and examined.  Somewhat weak  Objective: Vitals:   10/11/22 0850 10/11/22 1536 10/12/22 0515 10/12/22 0748  BP: (!) 158/104 (!) 145/77 (!) 123/56 137/63  Pulse: 78 82 69 70  Resp: Temp: (!) 97.5 F (36.4 C) 98.2 F (36.8 C) 98.2 F (36.8 C) (!) 97.3 F (36.3 C)  TempSrc: Oral Oral Oral Oral  SpO2: 98% 99% 98% 99%  Weight:      Height:        Intake/Output Summary (Last 24 hours) at 10/12/2022 1214 Last data filed at 10/12/2022 0955 Gross per 24 hour  Intake 340 ml  Output 2000 ml  Net -1660 ml   Filed Weights   10/10/22 0955  Weight: 71.7 kg    Examination:  General exam: NAD.  Frail, appears fatigued Respiratory system: Diffuse crackles bilaterally.  Normal work of breathing.  2 L Cardiovascular system: S1-S2, RRR, no murmurs, no pedal  edema Gastrointestinal system: Soft, NT/ND, normal bowel sounds Central nervous system: Alert.  X 2.  No focal deficits Extremities: Symmetric 5 x 5 power. Skin: No rashes, lesions or ulcers Psychiatry: Judgement and insight appear normal. Mood & affect appropriate.     Data Reviewed: I have personally reviewed following labs and imaging studies  CBC: Recent  Labs  Lab 10/10/22 1009 10/11/22 0548  WBC 11.0* 6.6  NEUTROABS 8.7*  --   HGB 14.3 12.6  HCT 45.1 38.8  MCV 95.1 93.0  PLT 205 157   Basic Metabolic Panel: Recent Labs  Lab 10/10/22 1009 10/11/22 0548  NA 136 140  K 3.8 3.9  CL 106 112*  CO2 21* 21*  GLUCOSE 184* 115*  BUN 23 18  CREATININE 0.99 0.71  CALCIUM 8.9 8.3*   GFR: Estimated Creatinine Clearance: 45 mL/min (by C-G formula based on SCr of 0.71 mg/dL). Liver Function Tests: Recent Labs  Lab 10/10/22 1009 10/11/22 0548  AST 105* 71*  ALT 87* 62*  ALKPHOS 601* 439*  BILITOT 1.4* 0.9  PROT 8.0 6.2*  ALBUMIN 3.3* 2.5*   No results for input(s): "LIPASE", "AMYLASE" in the last 168 hours. No results for input(s): "AMMONIA" in the last 168 hours. Coagulation Profile: Recent Labs  Lab 10/10/22 1009  INR 1.2   Cardiac Enzymes: No results for input(s): "CKTOTAL", "CKMB", "CKMBINDEX", "TROPONINI" in the last 168 hours. BNP (last 3 results) No results for input(s): "PROBNP" in the last 8760 hours. HbA1C: No results for input(s): "HGBA1C" in the last 72 hours. CBG: No results for input(s): "GLUCAP" in the last 168 hours. Lipid Profile: No results for input(s): "CHOL", "HDL", "LDLCALC", "TRIG", "CHOLHDL", "LDLDIRECT" in the last 72 hours. Thyroid Function Tests: No results for input(s): "TSH", "T4TOTAL", "FREET4", "T3FREE", "THYROIDAB" in the last 72 hours. Anemia Panel: No results for input(s): "VITAMINB12", "FOLATE", "FERRITIN", "TIBC", "IRON", "RETICCTPCT" in the last 72 hours. Sepsis Labs: Recent Labs  Lab 10/10/22 1009 10/10/22 1255 10/11/22 0548  PROCALCITON  --  0.30 0.29  LATICACIDVEN 1.7 1.4  --     Recent Results (from the past 240 hour(s))  Culture, blood (Routine x 2)     Status: None (Preliminary result)   Collection Time: 10/10/22 10:09 AM   Specimen: BLOOD RIGHT ARM  Result Value Ref Range Status   Specimen Description BLOOD RIGHT ARM  Final   Special Requests   Final    BOTTLES  DRAWN AEROBIC AND ANAEROBIC Blood Culture adequate volume   Culture   Final    NO GROWTH 2 DAYS Performed at [facility], [address], [place], [place] [address]    Report Status PENDING  Incomplete  Culture, blood (Routine x 2)     Status: None (Preliminary result)   Collection Time: 10/10/22 10:09 AM   Specimen: BLOOD  Result Value Ref Range Status   Specimen Description BLOOD LEFT [facility]  Final   Special Requests   Final    BOTTLES DRAWN AEROBIC AND ANAEROBIC Blood Culture adequate volume   Culture   Final    NO GROWTH 2 DAYS Performed at [facility], [address] [place], [place] [address]    Report Status PENDING  Incomplete         Radiology Studies: DG Chest Port 1 View  Result Date: 10/12/2022 CLINICAL DATA:  Hypoxia. EXAM: PORTABLE CHEST 1 VIEW COMPARISON:  10/10/2022. FINDINGS: 0837 hours. New mild interstitial pulmonary edema with trace bilateral pleural effusions. Stable mild cardiomegaly and mediastinal  contours. No pneumothorax. IMPRESSION: New mild interstitial pulmonary edema with trace bilateral pleural effusions. Electronically Signed   By: Orvan Falconer M.D.   On: 10/12/2022 09:34        Scheduled Meds:  vitamin C  1,000 mg Oral Daily   aspirin EC  81 mg Oral Daily   cycloSPORINE  1 drop Both Eyes BID   dorzolamide-timolol  1 drop Both Eyes BID   enoxaparin (LOVENOX) injection  40 mg Subcutaneous Q24H   famotidine  20 mg Oral BID   guaiFENesin  600 mg Oral BID   multivitamin with minerals  1 tablet Oral Daily   omega-3 acid ethyl esters  2 g Oral Daily   polyvinyl alcohol  1 drop Both Eyes TID   tiotropium  18 mcg Inhalation Daily   valACYclovir  500 mg Oral QHS   zinc sulfate  220 mg Oral Daily   Continuous Infusions:  cefTRIAXone (ROCEPHIN)  IV 2 g (10/12/22 1203)     LOS: 2 days      Tresa Moore, MD [facility]   If 7PM-7AM, please contact night-coverage  10/12/2022, 12:14 PM

## 2022-10-12 NOTE — Care Management Important Message (Signed)
Important Message  Patient Details  Name: Lauren Morris MRN: [id number] Date of Birth: 05/31/1932   Medicare Important Message Given:  N/A - LOS <3 / Initial given by admissions     Olegario Messier A Earma Nicolaou 10/12/2022, 8:03 AM

## 2022-10-13 DIAGNOSIS — K219 Gastro-esophageal reflux disease without esophagitis: Secondary | ICD-10-CM | POA: Diagnosis not present

## 2022-10-13 DIAGNOSIS — R079 Chest pain, unspecified: Secondary | ICD-10-CM | POA: Diagnosis not present

## 2022-10-13 DIAGNOSIS — Z741 Need for assistance with personal care: Secondary | ICD-10-CM | POA: Diagnosis not present

## 2022-10-13 DIAGNOSIS — E039 Hypothyroidism, unspecified: Secondary | ICD-10-CM | POA: Diagnosis not present

## 2022-10-13 DIAGNOSIS — R2689 Other abnormalities of gait and mobility: Secondary | ICD-10-CM | POA: Diagnosis not present

## 2022-10-13 DIAGNOSIS — Z8639 Personal history of other endocrine, nutritional and metabolic disease: Secondary | ICD-10-CM | POA: Diagnosis not present

## 2022-10-13 DIAGNOSIS — Z86711 Personal history of pulmonary embolism: Secondary | ICD-10-CM | POA: Diagnosis not present

## 2022-10-13 DIAGNOSIS — M6281 Muscle weakness (generalized): Secondary | ICD-10-CM | POA: Diagnosis not present

## 2022-10-13 DIAGNOSIS — N390 Urinary tract infection, site not specified: Secondary | ICD-10-CM | POA: Diagnosis not present

## 2022-10-13 DIAGNOSIS — I872 Venous insufficiency (chronic) (peripheral): Secondary | ICD-10-CM | POA: Diagnosis not present

## 2022-10-13 DIAGNOSIS — H409 Unspecified glaucoma: Secondary | ICD-10-CM | POA: Diagnosis not present

## 2022-10-13 DIAGNOSIS — H018 Other specified inflammations of eyelid: Secondary | ICD-10-CM | POA: Diagnosis not present

## 2022-10-13 DIAGNOSIS — Z7401 Bed confinement status: Secondary | ICD-10-CM | POA: Diagnosis not present

## 2022-10-13 DIAGNOSIS — R6510 Systemic inflammatory response syndrome (SIRS) of non-infectious origin without acute organ dysfunction: Secondary | ICD-10-CM | POA: Diagnosis not present

## 2022-10-13 DIAGNOSIS — J4489 Other specified chronic obstructive pulmonary disease: Secondary | ICD-10-CM | POA: Diagnosis not present

## 2022-10-13 DIAGNOSIS — J410 Simple chronic bronchitis: Secondary | ICD-10-CM | POA: Diagnosis not present

## 2022-10-13 DIAGNOSIS — J449 Chronic obstructive pulmonary disease, unspecified: Secondary | ICD-10-CM | POA: Diagnosis not present

## 2022-10-13 DIAGNOSIS — D320 Benign neoplasm of cerebral meninges: Secondary | ICD-10-CM | POA: Diagnosis not present

## 2022-10-13 DIAGNOSIS — I10 Essential (primary) hypertension: Secondary | ICD-10-CM | POA: Diagnosis not present

## 2022-10-13 DIAGNOSIS — R278 Other lack of coordination: Secondary | ICD-10-CM | POA: Diagnosis not present

## 2022-10-13 DIAGNOSIS — H8110 Benign paroxysmal vertigo, unspecified ear: Secondary | ICD-10-CM | POA: Diagnosis not present

## 2022-10-13 MED ORDER — ANORO ELLIPTA 62.5-25 MCG/ACT IN AEPB: 1.0000 | INHALATION_SPRAY | Freq: Every day | RESPIRATORY_TRACT | 1 refills | Status: DC

## 2022-10-13 MED ORDER — MELATONIN 5 MG PO TABS: 5.0000 mg | ORAL_TABLET | Freq: Every evening | ORAL | 0 refills | Status: DC | PRN

## 2022-10-13 MED ORDER — ALBUTEROL SULFATE (2.5 MG/3ML) 0.083% IN NEBU: 2.5000 mg | INHALATION_SOLUTION | Freq: Four times a day (QID) | RESPIRATORY_TRACT | 12 refills | Status: AC | PRN

## 2022-10-13 MED ORDER — GUAIFENESIN ER 600 MG PO TB12: 600.0000 mg | ORAL_TABLET | Freq: Two times a day (BID) | ORAL | Status: DC

## 2022-10-13 NOTE — Plan of Care (Signed)
Report given to Zella Ball at [facility].    Problem: Fluid Volume: Goal: Hemodynamic stability will improve Outcome: Adequate for Discharge   Problem: Clinical Measurements: Goal: Diagnostic test results will improve Outcome: Adequate for Discharge Goal: Signs and symptoms of infection will decrease Outcome: Adequate for Discharge   Problem: Respiratory: Goal: Ability to maintain adequate ventilation will improve Outcome: Adequate for Discharge   Problem: Education: Goal: Knowledge of General Education information will improve Description: Including pain rating scale, medication(s)/side effects and non-pharmacologic comfort measures Outcome: Adequate for Discharge   Problem: Health Behavior/Discharge Planning: Goal: Ability to manage health-related needs will improve Outcome: Adequate for Discharge   Problem: Clinical Measurements: Goal: Ability to maintain clinical measurements within normal limits will improve Outcome: Adequate for Discharge Goal: Will remain free from infection Outcome: Adequate for Discharge Goal: Diagnostic test results will improve Outcome: Adequate for Discharge Goal: Respiratory complications will improve Outcome: Adequate for Discharge Goal: Cardiovascular complication will be avoided Outcome: Adequate for Discharge   Problem: Activity: Goal: Risk for activity intolerance will decrease Outcome: Adequate for Discharge   Problem: Nutrition: Goal: Adequate nutrition will be maintained Outcome: Adequate for Discharge   Problem: Coping: Goal: Level of anxiety will decrease Outcome: Adequate for Discharge   Problem: Elimination: Goal: Will not experience complications related to bowel motility Outcome: Adequate for Discharge Goal: Will not experience complications related to urinary retention Outcome: Adequate for Discharge   Problem: Pain Managment: Goal: General experience of comfort will improve Outcome: Adequate for Discharge   Problem:  Safety: Goal: Ability to remain free from injury will improve Outcome: Adequate for Discharge   Problem: Skin Integrity: Goal: Risk for impaired skin integrity will decrease Outcome: Adequate for Discharge

## 2022-10-13 NOTE — Discharge Summary (Signed)
Physician Discharge Summary  Lauren Morris ZOX:[id number] DOB: Aug 26, 1931 DOA: 10/10/2022  PCP: Sherlene Shams, MD  Admit date: 10/10/2022 Discharge date: 10/13/2022  Admitted From: Home Disposition:  SNF  Recommendations for Outpatient Follow-up:  Follow up with PCP in 1-2 weeks   Home Health:No Equipment/Devices:None   Discharge Condition:Stable CODE STATUS:DNR  Diet recommendation: Reg  Brief/Interim Summary: 89 year old female with hypertension, GERD, Graves' disease with history of hypothyroid, OSA on CPAP, who presents emergency department for chief concerns of weakness and dysuria. She reports that this morning she felt too weak to get up from bed.  She reports that at baseline she performs her own ADLs.  She does her own cooking and loads the dishes in the dishwasher.  She does not really clean the house as she has a helper that occasionally comes to help clean her home.   She denies chest pain, shortness of breath, current swelling of her lower extremities, nausea, vomiting, fever, cough.  She endorses chills.  She denies abdominal pain, syncope, loss of consciousness.  She denies dysuria, hematuria, changes in urinary habits that she is aware of.   Patient endorses feeling better after administration of intravenous fluids and initiation of IV antibiotics.  Interestingly the urinalysis is nitrite positive but negative for pyuria.  Unclear etiology.  Will treat empirically for uncomplicated cystitis.  Fluid overload state resolved at time of dc.  Patient on RA.  Appropriate for DC to SNF    Discharge Diagnoses:  Principal Problem:   SIRS (systemic inflammatory response syndrome) Active Problems:   Essential hypertension   Generalized weakness   Obesity, diabetes, and hypertension syndrome   Do not resuscitate status   OSA on CPAP   Elevated liver enzymes  Severe sepsis secondary to UTI Presumed source urine.  Patient with mild hypotension responsive to IV fluids.   Increased liver enzymes presumably secondary to sepsis Interestingly urine is nitrite positive but pyuria negative Plan: Completed rocephin, no further abx   Pulmonary edema Occurred after aggressive IVF  No documented h/o HCF Plan: Fluid overload resolved Unclear etiology Consider outpatient echocardiogram   Elevated liver enzymes Presumed secondary to SIRS/sepsis, completed by history of nonalcoholic liver cirrhosis, will continue to fluid  LFTs downtrending  OSA on CPAP CPAP nightly ordered   Do not resuscitate status DNR form uploaded to [employer]   Obesity, diabetes, and hypertension syndrome This complicates overall care and prognosis.    Generalized weakness Fall precautions   Essential hypertension Slowly restart antihypertensive regimen   Discharge Instructions  Discharge Instructions     Diet - low sodium heart healthy   Complete by: As directed    Increase activity slowly   Complete by: As directed       Allergies as of 10/13/2022   No Known Allergies      Medication List     STOP taking these medications    prednisoLONE acetate 1 % ophthalmic suspension Commonly known as: PRED FORTE   tiotropium 18 MCG inhalation capsule Commonly known as: Spiriva HandiHaler   tobramycin-dexamethasone ophthalmic solution Commonly known as: TOBRADEX   Visine Dry Eye 0.2-0.2-1 % Soln Generic drug: Glycerin-Hypromellose-PEG 400       TAKE these medications    albuterol (2.5 MG/3ML) 0.083% nebulizer solution Commonly known as: PROVENTIL Take 3 mLs (2.5 mg total) by nebulization every 6 (six) hours as needed for wheezing or shortness of breath.   Anoro Ellipta 62.5-25 MCG/ACT Aepb Generic drug: umeclidinium-vilanterol Inhale 1 puff into the lungs  daily.   Aspirin 81 MG Caps Take 1 capsule by mouth daily at 6 (six) AM.   atenolol 25 MG tablet Commonly known as: TENORMIN TAKE 1 TABLET BY MOUTH TWICE DAILY   CALCIUM 1000 + D PO Take 1 tablet  by mouth daily.   dorzolamide-timolol 2-0.5 % ophthalmic solution Commonly known as: COSOPT Place 1 drop into both eyes 2 (two) times daily.   famotidine 20 MG tablet Commonly known as: PEPCID TAKE 1 TABLET BY MOUTH 2 TIMES DAILY   fish oil-omega-3 fatty acids 1000 MG capsule Take 2 capsules (2 g total) by mouth daily.   guaiFENesin 600 MG 12 hr tablet Commonly known as: MUCINEX Take 1 tablet (600 mg total) by mouth 2 (two) times daily.   melatonin 5 MG Tabs Take 1 tablet (5 mg total) by mouth at bedtime as needed.   multivitamin with minerals Tabs tablet Take 1 tablet by mouth daily.   Restasis 0.05 % ophthalmic emulsion Generic drug: cycloSPORINE Place 1 drop into both eyes 2 (two) times daily.   telmisartan 40 MG tablet Commonly known as: MICARDIS TAKE 1 TABLET BY MOUTH AT BEDTIME   valACYclovir 500 MG tablet Commonly known as: VALTREX Take 500 mg by mouth at bedtime.   vitamin C 1000 MG tablet Take 1 tablet (1,000 mg total) by mouth daily.   zinc sulfate 220 (50 Zn) MG capsule Take 1 capsule (220 mg total) by mouth daily.        No Known Allergies  Consultations: None   Procedures/Studies: DG Chest Port 1 View  Result Date: 10/12/2022 CLINICAL DATA:  Hypoxia. EXAM: PORTABLE CHEST 1 VIEW COMPARISON:  10/10/2022. FINDINGS: 0837 hours. New mild interstitial pulmonary edema with trace bilateral pleural effusions. Stable mild cardiomegaly and mediastinal contours. No pneumothorax. IMPRESSION: New mild interstitial pulmonary edema with trace bilateral pleural effusions. Electronically Signed   By: Orvan Falconer M.D.   On: 10/12/2022 09:34   DG Chest Port 1 View  Result Date: 10/10/2022 CLINICAL DATA:  Sepsis EXAM: PORTABLE CHEST 1 VIEW COMPARISON:  Radiograph 11/12/2021 FINDINGS: Unchanged mildly enlarged cardiac silhouette. There is no new focal airspace disease. There is no large effusion or evidence of pneumothorax. No acute osseous abnormality. Thoracic  spondylosis. IMPRESSION: Unchanged mildly enlarged cardiac silhouette. No new focal airspace disease. Electronically Signed   By: Caprice Renshaw M.D.   On: 10/10/2022 10:32      Subjective: Seen and examined on day of dc.  Stable, appropriate for SNF discharge  Discharge Exam: Vitals:   10/13/22 0738 10/13/22 0738  BP: (!) 131/59 (!) 131/59  Pulse: 77 77  Resp: 18 18  Temp: 98.6 F (37 C) 98.6 F (37 C)  SpO2:  94%   Vitals:   10/12/22 2006 10/13/22 0434 10/13/22 0738 10/13/22 0738  BP: (!) 163/70 129/66 (!) 131/59 (!) 131/59  Pulse: 93 83 77 77  Resp: 18 20 18 18   Temp: 99.7 F (37.6 C) 98.5 F (36.9 C) 98.6 F (37 C) 98.6 F (37 C)  TempSrc:  Oral Oral Oral  SpO2: 93% 94%  94%  Weight:      Height:        General: Pt is alert, awake, not in acute distress Cardiovascular: RRR, S1/S2 +, no rubs, no gallops Respiratory: CTA bilaterally, no wheezing, no rhonchi Abdominal: Soft, NT, ND, bowel sounds + Extremities: no edema, no cyanosis    The results of significant diagnostics from this hospitalization (including imaging, microbiology, ancillary and laboratory) are listed  below for reference.     Microbiology: Recent Results (from the past 240 hour(s))  Culture, blood (Routine x 2)     Status: None (Preliminary result)   Collection Time: 10/10/22 10:09 AM   Specimen: BLOOD RIGHT ARM  Result Value Ref Range Status   Specimen Description BLOOD RIGHT ARM  Final   Special Requests   Final    BOTTLES DRAWN AEROBIC AND ANAEROBIC Blood Culture adequate volume   Culture   Final    NO GROWTH 3 DAYS Performed at [facility], [address] [place], [place] [address]    Report Status PENDING  Incomplete  Culture, blood (Routine x 2)     Status: None (Preliminary result)   Collection Time: 10/10/22 10:09 AM   Specimen: BLOOD  Result Value Ref Range Status   Specimen Description BLOOD LEFT AC  Final   Special Requests   Final    BOTTLES DRAWN AEROBIC AND  ANAEROBIC Blood Culture adequate volume   Culture   Final    NO GROWTH 3 DAYS Performed at [facility], [address] [place], [place] [address]    Report Status PENDING  Incomplete     Labs: BNP (last 3 results) Recent Labs    10/12/22 1139  BNP 140.1*   Basic Metabolic Panel: Recent Labs  Lab 10/10/22 1009 10/11/22 0548  NA 136 140  K 3.8 3.9  CL 106 112*  CO2 21* 21*  GLUCOSE 184* 115*  BUN 23 18  CREATININE 0.99 0.71  CALCIUM 8.9 8.3*   Liver Function Tests: Recent Labs  Lab 10/10/22 1009 10/11/22 0548  AST 105* 71*  ALT 87* 62*  ALKPHOS 601* 439*  BILITOT 1.4* 0.9  PROT 8.0 6.2*  ALBUMIN 3.3* 2.5*   No results for input(s): "LIPASE", "AMYLASE" in the last 168 hours. No results for input(s): "AMMONIA" in the last 168 hours. CBC: Recent Labs  Lab 10/10/22 1009 10/11/22 0548  WBC 11.0* 6.6  NEUTROABS 8.7*  --   HGB 14.3 12.6  HCT 45.1 38.8  MCV 95.1 93.0  PLT 205 157   Cardiac Enzymes: No results for input(s): "CKTOTAL", "CKMB", "CKMBINDEX", "TROPONINI" in the last 168 hours. BNP: Invalid input(s): "POCBNP" CBG: No results for input(s): "GLUCAP" in the last 168 hours. D-Dimer No results for input(s): "DDIMER" in the last 72 hours. Hgb A1c No results for input(s): "HGBA1C" in the last 72 hours. Lipid Profile No results for input(s): "CHOL", "HDL", "LDLCALC", "TRIG", "CHOLHDL", "LDLDIRECT" in the last 72 hours. Thyroid function studies No results for input(s): "TSH", "T4TOTAL", "T3FREE", "THYROIDAB" in the last 72 hours.  Invalid input(s): "FREET3" Anemia work up No results for input(s): "VITAMINB12", "FOLATE", "FERRITIN", "TIBC", "IRON", "RETICCTPCT" in the last 72 hours. Urinalysis    Component Value Date/Time   COLORURINE AMBER (A) 10/10/2022 1101   APPEARANCEUR HAZY (A) 10/10/2022 1101   LABSPEC 1.016 10/10/2022 1101   PHURINE 5.0 10/10/2022 1101   GLUCOSEU NEGATIVE 10/10/2022 1101   GLUCOSEU NEGATIVE 05/07/2022 1638    HGBUR NEGATIVE 10/10/2022 1101   BILIRUBINUR NEGATIVE 10/10/2022 1101   BILIRUBINUR neg 05/09/2018 1105   KETONESUR NEGATIVE 10/10/2022 1101   PROTEINUR NEGATIVE 10/10/2022 1101   UROBILINOGEN 0.2 05/07/2022 1638   NITRITE POSITIVE (A) 10/10/2022 1101   LEUKOCYTESUR NEGATIVE 10/10/2022 1101   Sepsis Labs Recent Labs  Lab 10/10/22 1009 10/11/22 0548  WBC 11.0* 6.6   Microbiology Recent Results (from the past 240 hour(s))  Culture, blood (Routine x 2)  Status: None (Preliminary result)   Collection Time: 10/10/22 10:09 AM   Specimen: BLOOD RIGHT ARM  Result Value Ref Range Status   Specimen Description BLOOD RIGHT ARM  Final   Special Requests   Final    BOTTLES DRAWN AEROBIC AND ANAEROBIC Blood Culture adequate volume   Culture   Final    NO GROWTH 3 DAYS Performed at [facility], [address], [place], [place] [address]    Report Status PENDING  Incomplete  Culture, blood (Routine x 2)     Status: None (Preliminary result)   Collection Time: 10/10/22 10:09 AM   Specimen: BLOOD  Result Value Ref Range Status   Specimen Description BLOOD LEFT [facility]  Final   Special Requests   Final    BOTTLES DRAWN AEROBIC AND ANAEROBIC Blood Culture adequate volume   Culture   Final    NO GROWTH 3 DAYS Performed at [facility], [address] [place], [place] [address]    Report Status PENDING  Incomplete     Time coordinating discharge: Over 30 minutes  SIGNED:   Tresa Moore, MD  [facility] 10/13/2022, 8:55 AM Pager   If 7PM-7AM, please contact night-coverage

## 2022-10-13 NOTE — Progress Notes (Signed)
Mobility Specialist - Progress Note   10/13/22 0926  Mobility  Activity Ambulated with assistance in room;Stood at bedside;Dangled on edge of bed  Level of Assistance Contact guard assist, steadying assist  Assistive Device Front wheel walker  Distance Ambulated (ft) 5 ft  Activity Response Tolerated well  Mobility Referral Yes  $Mobility charge 1 Mobility    Pt EOB on RA upon arrival. Pt STS and ambulates a few steps from bed to recliner CGA with extra time. Pt left in recliner with needs in reach and RN in room.   Terrilyn Saver  Mobility Specialist  10/13/22 9:30 AM

## 2022-10-13 NOTE — TOC Transition Note (Signed)
Transition of Care [facility]) - CM/SW Discharge Note   Patient Details  Name: Lauren Morris MRN: [id number] Date of Birth: 01-06-1932  Transition of Care [facility]) CM/SW Contact:  Allena Katz, LCSW Phone Number: 10/13/2022, 9:13 AM   Clinical Narrative:   Pt discharging today to [facility]. CSW spoke with Sue Lush at [facility] who is able to take pt today and will provide CSW number for report. Per RN, family requesting EMS transport. CSW to print medical necessity to unit. DC summary sent.            Patient Goals and CMS Choice      Discharge Placement                         Discharge Plan and Services Additional resources added to the After Visit Summary for                                       Social Determinants of Health (SDOH) Interventions SDOH Screenings   Food Insecurity: No Food Insecurity (10/10/2022)  Housing: Low Risk  (10/10/2022)  Transportation Needs: No Transportation Needs (10/10/2022)  Utilities: Not At Risk (10/10/2022)  Depression (PHQ2-9): Low Risk  (05/07/2022)  Tobacco Use: Low Risk  (10/10/2022)     Readmission Risk Interventions     No data to display

## 2022-10-15 ENCOUNTER — Non-Acute Institutional Stay (SKILLED_NURSING_FACILITY): Payer: Medicare Other | Admitting: Student

## 2022-10-15 ENCOUNTER — Encounter: Payer: Self-pay | Admitting: Student

## 2022-10-15 DIAGNOSIS — R6510 Systemic inflammatory response syndrome (SIRS) of non-infectious origin without acute organ dysfunction: Secondary | ICD-10-CM

## 2022-10-15 DIAGNOSIS — H0189 Other specified inflammation of unspecified eye, unspecified eyelid: Secondary | ICD-10-CM

## 2022-10-15 DIAGNOSIS — J410 Simple chronic bronchitis: Secondary | ICD-10-CM | POA: Diagnosis not present

## 2022-10-15 DIAGNOSIS — I10 Essential (primary) hypertension: Secondary | ICD-10-CM | POA: Diagnosis not present

## 2022-10-15 DIAGNOSIS — Z8639 Personal history of other endocrine, nutritional and metabolic disease: Secondary | ICD-10-CM

## 2022-10-15 DIAGNOSIS — H018 Other specified inflammations of eyelid: Secondary | ICD-10-CM

## 2022-10-15 LAB — CULTURE, BLOOD (ROUTINE X 2)
Culture: NO GROWTH
Culture: NO GROWTH
Special Requests: ADEQUATE
Special Requests: ADEQUATE

## 2022-10-15 NOTE — Progress Notes (Signed)
Provider:  Dr. Earnestine Mealing Location:  Other [place].  Nursing Home Room Number: [facility] 104A Place of Service:  SNF (31)  PCP: Sherlene Shams, MD Patient Care Team: Sherlene Shams, MD as PCP - General (Internal Medicine)  Extended Emergency Contact Information Primary Emergency Contact: Marlaine Hind Address: [address]          [place], [place] [address] [place] Lauren Morris of [place] Home Phone: [phone] Relation: Daughter  Code Status: DNR Goals of Care: Advanced Directive information    10/15/2022    9:30 AM  Advanced Directives  Does Patient Have a Medical Advance Directive? Yes  Type of Advance Directive Out of facility DNR (pink MOST or yellow form)  Does patient want to make changes to medical advance directive? No - Patient declined      Chief Complaint  Patient presents with   New Admit To SNF    Admission     HPI: Patient is a 89 y.o. female seen today for admission to [facility] after hospitalization. She has a PMH HTN, GERD, Graves, OSA on CPAP who was admitted for urosepsis and weakness.  She couldn't get out of bed because of weakness. She has had an ulcer and infection on the eye for a long time-- has to keep treatment for 90 days.   She went via EMS. In the hosptial Wednesday, Thursday and Friday. She Completed rocephin without further antibiotics. She was so fluid up that they had to use diuretics-- which made her more weak.   She lives at home alone. She uses a cane to walk. She does the work around [employer] that she can. She gets some help periodically. She doesn't drive anymore because she had a fall in 2022 and hasn't driven since then (she was admitted to [facility] at that time). She prepares her meals as well. She typically manages her finances, but her eyes have not improved yet. She manages her medications at home.  She is eating well. Having good bowel movements.   Her daughter is at bedside Past Medical History:  Diagnosis Date   Cyst     hx o on right breast   DJD (degenerative joint disease)    right knee   Glaucoma    Hypertension    Pneumonia due to COVID-19 virus 12/12/2020   Sleep apnea    Thyroid disease    Past Surgical History:  Procedure Laterality Date   ABDOMINAL HYSTERECTOMY     VITRECTOMY  July 2012   right eye with membrane peel Lelon Perla, [place])    reports that she has never smoked. She has never used smokeless tobacco. She reports that she does not drink alcohol and does not use drugs. Social History   Socioeconomic History   Marital status: Widowed    Spouse name: Not on file   Number of children: Not on file   Years of education: Not on file   Highest education level: Not on file  Occupational History   Not on file  Tobacco Use   Smoking status: Never   Smokeless tobacco: Never  Vaping Use   Vaping Use: Never used  Substance and Sexual Activity   Alcohol use: No   Drug use: No   Sexual activity: Not Currently  Other Topics Concern   Not on file  Social History Narrative   Not on file   Social Determinants of Health   Financial Resource Strain: Not on file  Food Insecurity: No Food Insecurity (10/10/2022)  Hunger Vital Sign    Worried About Running Out of Food in the Last Year: Never true    Ran Out of Food in the Last Year: Never true  Transportation Needs: No Transportation Needs (10/10/2022)   PRAPARE - Administrator, Civil Service (Medical): No    Lack of Transportation (Non-Medical): No  Physical Activity: Not on file  Stress: Not on file  Social Connections: Not on file  Intimate Partner Violence: Not At Risk (10/10/2022)   Humiliation, Afraid, Rape, and Kick questionnaire    Fear of Current or Ex-Partner: No    Emotionally Abused: No    Physically Abused: No    Sexually Abused: No    Functional Status Survey:    Family History  Problem Relation Age of Onset   Cancer Father    Hypertension Father    Heart attack Mother    Hypertension Mother      Health Maintenance  Topic Date Due   Zoster Vaccines- Shingrix (1 of 2) Never done   Medicare Annual Wellness (AWV)  04/08/2018   DTaP/Tdap/Td (2 - Td or Tdap) 04/14/2021   COVID-19 Vaccine (4 - 2023-24 season) 02/23/2022   FOOT EXAM  03/17/2022   MAMMOGRAM  05/08/2023 (Originally 07/11/2018)   HEMOGLOBIN A1C  11/05/2022   INFLUENZA VACCINE  01/24/2023   OPHTHALMOLOGY EXAM  04/13/2023   Pneumonia Vaccine 81+ Years old  Completed   DEXA SCAN  Completed   HPV VACCINES  Aged Out    No Known Allergies  Outpatient Encounter Medications as of 10/15/2022  Medication Sig   albuterol (PROVENTIL) (2.5 MG/3ML) 0.083% nebulizer solution Take 3 mLs (2.5 mg total) by nebulization every 6 (six) hours as needed for wheezing or shortness of breath.   Ascorbic Acid (VITAMIN C) 1000 MG tablet Take 1 tablet (1,000 mg total) by mouth daily.   Aspirin 81 MG CAPS Take 1 capsule by mouth daily at 6 (six) AM.   atenolol (TENORMIN) 25 MG tablet TAKE 1 TABLET BY MOUTH TWICE DAILY   Calcium Carb-Cholecalciferol (CALCIUM 1000 + D PO) Take 1 tablet by mouth daily.   dorzolamide-timolol (COSOPT) 22.3-6.8 MG/ML ophthalmic solution Place 1 drop into both eyes 2 (two) times daily.   famotidine (PEPCID) 20 MG tablet TAKE 1 TABLET BY MOUTH 2 TIMES DAILY   fish oil-omega-3 fatty acids 1000 MG capsule Take 2 capsules (2 g total) by mouth daily.   guaiFENesin (MUCINEX) 600 MG 12 hr tablet Take 1 tablet (600 mg total) by mouth 2 (two) times daily.   melatonin 5 MG TABS Take 1 tablet (5 mg total) by mouth at bedtime as needed.   Multiple Vitamin (MULTIVITAMIN WITH MINERALS) TABS tablet Take 1 tablet by mouth daily.   RESTASIS 0.05 % ophthalmic emulsion Place 1 drop into both eyes 2 (two) times daily.   telmisartan (MICARDIS) 40 MG tablet TAKE 1 TABLET BY MOUTH AT BEDTIME   umeclidinium-vilanterol (ANORO ELLIPTA) 62.5-25 MCG/ACT AEPB Inhale 1 puff into the lungs daily.   valACYclovir (VALTREX) 500 MG tablet Take 500 mg  by mouth at bedtime.   Zinc Oxide (TRIPLE PASTE) 12.8 % ointment Apply 1 Application topically. Every shift   zinc sulfate 220 (50 Zn) MG capsule Take 1 capsule (220 mg total) by mouth daily.   No facility-administered encounter medications on file as of 10/15/2022.    Review of Systems  Vitals:   10/15/22 0914  BP: 130/78  Pulse: 63  Resp: 16  Temp: 97.7 F (36.5  C)  SpO2: 95%  Weight: 155 lb 12.8 oz (70.7 kg)  Height: 5\' 2"  (1.575 m)   Body mass index is 28.5 kg/m. Physical Exam Vitals reviewed.  Constitutional:      Appearance: Normal appearance.  Cardiovascular:     Rate and Rhythm: Normal rate.     Pulses: Normal pulses.  Pulmonary:     Effort: Pulmonary effort is normal.  Abdominal:     General: Abdomen is flat.     Palpations: Abdomen is soft.  Skin:    General: Skin is warm and dry.  Neurological:     Mental Status: She is alert and oriented to person, place, and time.     Labs reviewed: Basic Metabolic Panel: Recent Labs    11/13/21 0531 05/07/22 1638 10/10/22 1009 10/11/22 0548  NA 136 133* 136 140  K 3.4* 4.4 3.8 3.9  CL 106 99 106 112*  CO2 22 25 21* 21*  GLUCOSE 112* 95 184* 115*  BUN 14 24* 23 18  CREATININE 0.65 0.99 0.99 0.71  CALCIUM 8.1* 9.5 8.9 8.3*  MG 2.0  --   --   --   PHOS 2.9  --   --   --    Liver Function Tests: Recent Labs    05/07/22 1638 10/10/22 1009 10/11/22 0548  AST 79* 105* 71*  ALT 76* 87* 62*  ALKPHOS 598* 601* 439*  BILITOT 1.1 1.4* 0.9  PROT 8.0 8.0 6.2*  ALBUMIN 3.8 3.3* 2.5*   No results for input(s): "LIPASE", "AMYLASE" in the last 8760 hours. No results for input(s): "AMMONIA" in the last 8760 hours. CBC: Recent Labs    11/12/21 1050 11/12/21 1638 11/13/21 0531 10/10/22 1009 10/11/22 0548  WBC 10.2   < > 8.2 11.0* 6.6  NEUTROABS 7.4  --   --  8.7*  --   HGB 14.9   < > 12.6 14.3 12.6  HCT 46.1*   < > 39.8 45.1 38.8  MCV 90.4   < > 91.9 95.1 93.0  PLT 246   < > 200 205 157   < > = values  in this interval not displayed.   Cardiac Enzymes: No results for input(s): "CKTOTAL", "CKMB", "CKMBINDEX", "TROPONINI" in the last 8760 hours. BNP: Invalid input(s): "POCBNP" Lab Results  Component Value Date   HGBA1C 6.0 05/07/2022   Lab Results  Component Value Date   TSH 5.46 02/16/2022   Lab Results  Component Value Date   VITAMINB12 294 08/02/2021   No results found for: "FOLATE" Lab Results  Component Value Date   IRON 93 06/16/2018   TIBC 267 06/16/2018   FERRITIN 407 (H) 11/26/2020    Imaging and Procedures obtained prior to SNF admission: DG Chest Port 1 View  Result Date: 10/10/2022 CLINICAL DATA:  Sepsis EXAM: PORTABLE CHEST 1 VIEW COMPARISON:  Radiograph 11/12/2021 FINDINGS: Unchanged mildly enlarged cardiac silhouette. There is no new focal airspace disease. There is no large effusion or evidence of pneumothorax. No acute osseous abnormality. Thoracic spondylosis. IMPRESSION: Unchanged mildly enlarged cardiac silhouette. No new focal airspace disease. Electronically Signed   By: Caprice Renshaw M.D.   On: 10/10/2022 10:32    Assessment/Plan 1. SIRS (systemic inflammatory response syndrome) Patient with recent admission for sirs criteria in the setting of urinary tract infection. S/p treatment. Much improved. Eating and drinking well. Continue to monitor for symptoms.   2. Essential hypertension BP well-controlled. Continue Tlmisartan and atenolol.   3. H/O: hypothyroidism Most recent TSH >  6 months ago. Recheck with next set of labs.   4. Eyelid ulceration Followed by ophthalmology. Continue Valtrex 500 mg daily for 90 day treatment. Continue eye drops as previously prescribed  5. Chronic Bronchitis Continue PRN albuterol and anoro ellipta (at home was on spiriva.   Family/ staff Communication: daughter and nursing  Labs/tests ordered: CBC and BMP in 1 week

## 2022-11-02 ENCOUNTER — Telehealth: Payer: Self-pay | Admitting: Internal Medicine

## 2022-11-02 NOTE — Telephone Encounter (Signed)
Tiffany from [facility] called stating pt is not allowing [place] to come out until next week due to her having family comng this week

## 2022-11-05 ENCOUNTER — Ambulatory Visit: Payer: Medicare Other | Admitting: Internal Medicine

## 2022-11-05 DIAGNOSIS — Z8701 Personal history of pneumonia (recurrent): Secondary | ICD-10-CM | POA: Diagnosis not present

## 2022-11-05 DIAGNOSIS — E0500 Thyrotoxicosis with diffuse goiter without thyrotoxic crisis or storm: Secondary | ICD-10-CM | POA: Diagnosis not present

## 2022-11-05 DIAGNOSIS — G4730 Sleep apnea, unspecified: Secondary | ICD-10-CM | POA: Diagnosis not present

## 2022-11-05 DIAGNOSIS — A419 Sepsis, unspecified organism: Secondary | ICD-10-CM | POA: Diagnosis not present

## 2022-11-05 DIAGNOSIS — M1711 Unilateral primary osteoarthritis, right knee: Secondary | ICD-10-CM | POA: Diagnosis not present

## 2022-11-05 DIAGNOSIS — H018 Other specified inflammations of eyelid: Secondary | ICD-10-CM | POA: Diagnosis not present

## 2022-11-05 DIAGNOSIS — Z79899 Other long term (current) drug therapy: Secondary | ICD-10-CM | POA: Diagnosis not present

## 2022-11-05 DIAGNOSIS — I959 Hypotension, unspecified: Secondary | ICD-10-CM | POA: Diagnosis not present

## 2022-11-05 DIAGNOSIS — E039 Hypothyroidism, unspecified: Secondary | ICD-10-CM | POA: Diagnosis not present

## 2022-11-05 DIAGNOSIS — K219 Gastro-esophageal reflux disease without esophagitis: Secondary | ICD-10-CM | POA: Diagnosis not present

## 2022-11-05 DIAGNOSIS — Z9181 History of falling: Secondary | ICD-10-CM | POA: Diagnosis not present

## 2022-11-05 DIAGNOSIS — H409 Unspecified glaucoma: Secondary | ICD-10-CM | POA: Diagnosis not present

## 2022-11-05 DIAGNOSIS — Z556 Problems related to health literacy: Secondary | ICD-10-CM | POA: Diagnosis not present

## 2022-11-05 DIAGNOSIS — I10 Essential (primary) hypertension: Secondary | ICD-10-CM | POA: Diagnosis not present

## 2022-11-05 DIAGNOSIS — Z8616 Personal history of COVID-19: Secondary | ICD-10-CM | POA: Diagnosis not present

## 2022-11-05 DIAGNOSIS — N390 Urinary tract infection, site not specified: Secondary | ICD-10-CM | POA: Diagnosis not present

## 2022-11-05 DIAGNOSIS — J410 Simple chronic bronchitis: Secondary | ICD-10-CM | POA: Diagnosis not present

## 2022-11-05 DIAGNOSIS — G4733 Obstructive sleep apnea (adult) (pediatric): Secondary | ICD-10-CM | POA: Diagnosis not present

## 2022-11-05 DIAGNOSIS — E119 Type 2 diabetes mellitus without complications: Secondary | ICD-10-CM | POA: Diagnosis not present

## 2022-11-05 DIAGNOSIS — Z6829 Body mass index (BMI) 29.0-29.9, adult: Secondary | ICD-10-CM | POA: Diagnosis not present

## 2022-11-05 DIAGNOSIS — Z90710 Acquired absence of both cervix and uterus: Secondary | ICD-10-CM | POA: Diagnosis not present

## 2022-11-05 DIAGNOSIS — K7460 Unspecified cirrhosis of liver: Secondary | ICD-10-CM | POA: Diagnosis not present

## 2022-11-05 DIAGNOSIS — E669 Obesity, unspecified: Secondary | ICD-10-CM | POA: Diagnosis not present

## 2022-11-05 NOTE — Telephone Encounter (Signed)
FYI

## 2022-11-08 ENCOUNTER — Emergency Department: Payer: Medicare Other

## 2022-11-08 ENCOUNTER — Other Ambulatory Visit: Payer: Self-pay

## 2022-11-08 ENCOUNTER — Telehealth: Payer: Self-pay | Admitting: Internal Medicine

## 2022-11-08 ENCOUNTER — Emergency Department
Admission: EM | Admit: 2022-11-08 | Discharge: 2022-11-08 | Disposition: A | Discharge status: Home / Self Care | Payer: Medicare Other | Attending: Emergency Medicine | Admitting: Emergency Medicine

## 2022-11-08 ENCOUNTER — Inpatient Hospital Stay: Payer: Medicare Other | Admitting: Internal Medicine

## 2022-11-08 DIAGNOSIS — E039 Hypothyroidism, unspecified: Secondary | ICD-10-CM | POA: Diagnosis not present

## 2022-11-08 DIAGNOSIS — J189 Pneumonia, unspecified organism: Secondary | ICD-10-CM | POA: Diagnosis not present

## 2022-11-08 DIAGNOSIS — N390 Urinary tract infection, site not specified: Secondary | ICD-10-CM | POA: Diagnosis not present

## 2022-11-08 DIAGNOSIS — R0989 Other specified symptoms and signs involving the circulatory and respiratory systems: Secondary | ICD-10-CM | POA: Diagnosis not present

## 2022-11-08 DIAGNOSIS — A419 Sepsis, unspecified organism: Secondary | ICD-10-CM | POA: Diagnosis not present

## 2022-11-08 DIAGNOSIS — Z1152 Encounter for screening for COVID-19: Secondary | ICD-10-CM | POA: Diagnosis not present

## 2022-11-08 DIAGNOSIS — I10 Essential (primary) hypertension: Secondary | ICD-10-CM | POA: Insufficient documentation

## 2022-11-08 DIAGNOSIS — E119 Type 2 diabetes mellitus without complications: Secondary | ICD-10-CM | POA: Diagnosis not present

## 2022-11-08 DIAGNOSIS — M1711 Unilateral primary osteoarthritis, right knee: Secondary | ICD-10-CM | POA: Diagnosis not present

## 2022-11-08 DIAGNOSIS — R509 Fever, unspecified: Secondary | ICD-10-CM | POA: Diagnosis present

## 2022-11-08 DIAGNOSIS — J9809 Other diseases of bronchus, not elsewhere classified: Secondary | ICD-10-CM | POA: Diagnosis not present

## 2022-11-08 DIAGNOSIS — J410 Simple chronic bronchitis: Secondary | ICD-10-CM | POA: Diagnosis not present

## 2022-11-08 LAB — COMPREHENSIVE METABOLIC PANEL
ALT: 82 U/L — ABNORMAL HIGH (ref 0–44)
AST: 82 U/L — ABNORMAL HIGH (ref 15–41)
Albumin: 3.3 g/dL — ABNORMAL LOW (ref 3.5–5.0)
Alkaline Phosphatase: 514 U/L — ABNORMAL HIGH (ref 38–126)
Anion gap: 9 (ref 5–15)
BUN: 17 mg/dL (ref 8–23)
CO2: 22 mmol/L (ref 22–32)
Calcium: 9.2 mg/dL (ref 8.9–10.3)
Chloride: 100 mmol/L (ref 98–111)
Creatinine, Ser: 0.91 mg/dL (ref 0.44–1.00)
GFR, Estimated: >60 mL/min (ref >60)
Glucose, Bld: 193 mg/dL — ABNORMAL HIGH (ref 70–99)
Potassium: 3.8 mmol/L (ref 3.5–5.1)
Sodium: 131 mmol/L — ABNORMAL LOW (ref 135–145)
Total Bilirubin: 1.3 mg/dL — ABNORMAL HIGH (ref 0.3–1.2)
Total Protein: 8.2 g/dL — ABNORMAL HIGH (ref 6.5–8.1)

## 2022-11-08 LAB — URINALYSIS, ROUTINE W REFLEX MICROSCOPIC
Bilirubin Urine: NEGATIVE
Glucose, UA: NEGATIVE mg/dL
Hgb urine dipstick: NEGATIVE
Ketones, ur: NEGATIVE mg/dL
Nitrite: NEGATIVE
Protein, ur: NEGATIVE mg/dL
Specific Gravity, Urine: 1.017 (ref 1.005–1.030)
pH: 5 (ref 5.0–8.0)

## 2022-11-08 LAB — CBC WITH DIFFERENTIAL/PLATELET
Abs Immature Granulocytes: 0.01 10*3/uL (ref 0.00–0.07)
Basophils Absolute: 0 10*3/uL (ref 0.0–0.1)
Basophils Relative: 1 %
Eosinophils Absolute: 0.1 10*3/uL (ref 0.0–0.5)
Eosinophils Relative: 1 %
HCT: 43.4 % (ref 36.0–46.0)
Hemoglobin: 14.2 g/dL (ref 12.0–15.0)
Immature Granulocytes: 0 %
Lymphocytes Relative: 19 %
Lymphs Abs: 1.5 10*3/uL (ref 0.7–4.0)
MCH: 30.9 pg (ref 26.0–34.0)
MCHC: 32.7 g/dL (ref 30.0–36.0)
MCV: 94.3 fL (ref 80.0–100.0)
Monocytes Absolute: 0.7 10*3/uL (ref 0.1–1.0)
Monocytes Relative: 9 %
Neutro Abs: 5.6 10*3/uL (ref 1.7–7.7)
Neutrophils Relative %: 70 %
Platelets: 169 10*3/uL (ref 150–400)
RBC: 4.6 MIL/uL (ref 3.87–5.11)
RDW: 14.8 % (ref 11.5–15.5)
WBC: 7.9 10*3/uL (ref 4.0–10.5)
nRBC: 0 % (ref 0.0–0.2)

## 2022-11-08 LAB — RESP PANEL BY RT-PCR (RSV, FLU A&B, COVID)  RVPGX2
Influenza A by PCR: NEGATIVE
Influenza B by PCR: NEGATIVE
Resp Syncytial Virus by PCR: NEGATIVE
SARS Coronavirus 2 by RT PCR: NEGATIVE

## 2022-11-08 LAB — LACTIC ACID, PLASMA
Lactic Acid, Venous: 2.1 mmol/L (ref 0.5–1.9)
Lactic Acid, Venous: 0.9 mmol/L (ref 0.5–1.9)

## 2022-11-08 MED ORDER — SODIUM CHLORIDE 0.9 % IV SOLN
500.0000 mg | Freq: Once | INTRAVENOUS | Status: AC
  Administered 2022-11-08: 500 mg via INTRAVENOUS
  Filled 2022-11-08: qty 5

## 2022-11-08 MED ORDER — LACTATED RINGERS IV BOLUS
1000.0000 mL | Freq: Once | INTRAVENOUS | Status: AC
  Administered 2022-11-08: 1000 mL via INTRAVENOUS

## 2022-11-08 MED ORDER — CEFPODOXIME PROXETIL 200 MG PO TABS: 200.0000 mg | ORAL_TABLET | Freq: Two times a day (BID) | ORAL | 0 refills | Status: AC

## 2022-11-08 MED ORDER — AZITHROMYCIN 250 MG PO TABS: ORAL_TABLET | ORAL | 0 refills | Status: DC

## 2022-11-08 MED ORDER — SODIUM CHLORIDE 0.9 % IV SOLN
1.0000 g | Freq: Once | INTRAVENOUS | Status: AC
  Administered 2022-11-08: 1 g via INTRAVENOUS
  Filled 2022-11-08: qty 10

## 2022-11-08 NOTE — Discharge Instructions (Addendum)
Your CAT scan showed you likely have a pneumonia.  You may also have a urinary tract infection.  Please take the cefpodoxime twice a day for the next 7 days and the azithromycin once a day for the next 4 days.  If you develop worsening shortness of breath confusion abdominal pain or anything else that is concerning to you then please return the emergency department.

## 2022-11-08 NOTE — Telephone Encounter (Signed)
Patient's daughter, [name], called to state she is returning a missed call.  I do not see a call from [place], but we received a call from [facility] a little earlier today.  I read our message from Darral Dash at [facility] to [place] and [place] states patient's temperature is now 100.5, but she still has the other symptoms and has gotten weaker instead of stronger today.  Patient's daughter states she is not sure whether or not she should take patient somewhere.  I transferred call to Access Nurse.

## 2022-11-08 NOTE — ED Notes (Addendum)
EDT assisted pt to bathroom before getting into ED stretcher. Clean catch urine sample was collected while pt was in the bathroom.  Pt in bed in her desired position of comfort with a blanket per pt request. Pt connected to all monitoring devices, including the 12 lead, and instructed to leave them on for monitoring purposes. Both bed rails raised to promote safety due to pt complaining of dizziness and being unsteady. Bed locked in lowest position. Call bell within reach and pt made aware of its proper use.   Pt with family member at bedside currently. MD also at bedside. RN made aware of pt arrival.

## 2022-11-08 NOTE — Telephone Encounter (Signed)
Reviewed chart and noted that patient was taken to ED.

## 2022-11-08 NOTE — ED Notes (Signed)
Pt ambulated with walker. Pt had steady gait, denied SOB or dizziness. Pts O2 sats on RA 95-100%,

## 2022-11-08 NOTE — ED Provider Notes (Signed)
[facility] Provider Note    Event Date/Time   First MD Initiated Contact with Patient 11/08/22 1929     (approximate)   History   Fever   HPI  Lauren Morris is a 89 y.o. female with past medical history of GERD, hypothyroidism, OSA who presents because of fever and cough.  Patient has had cough productive of clear and intermittently yellow sputum for about 2 days.  Today home health came out and she had temp of 100.5.  She denies nausea vomiting diarrhea.  Denies shortness of breath.  Has had nasal congestion with postnasal drip.  Denies abdominal pain diarrhea.  Still eating and drinking.  Denies headache or bodyaches.  No sick contacts.     Past Medical History:  Diagnosis Date   Cyst    hx o on right breast   DJD (degenerative joint disease)    right knee   Glaucoma    Hypertension    Pneumonia due to COVID-19 virus 12/12/2020   Sleep apnea    Thyroid disease     Patient Active Problem List   Diagnosis Date Noted   SIRS (systemic inflammatory response syndrome) (HCC) 10/10/2022   OSA on CPAP 10/10/2022   Elevated liver enzymes 10/10/2022   Graves disease 02/16/2022   UTI (urinary tract infection) 11/12/2021   Dizziness 08/03/2021   Do not resuscitate status 08/03/2021   Musculoskeletal back pain 08/03/2021   Meningioma (HCC) 08/03/2021   Weakness 08/02/2021   Vertigo 08/02/2021   Hospital discharge follow-up 12/12/2020   Personal history of PE (pulmonary embolism) 12/12/2020   Obesity, diabetes, and hypertension syndrome (HCC) 11/24/2020   Chronic bronchitis (HCC) 01/16/2020   Abnormal EKG 07/07/2019   Generalized weakness 07/07/2019   H/O: hypothyroidism 07/07/2019   Abnormal chest CT 05/25/2016   Fatty liver disease, nonalcoholic 10/09/2014   Do not resuscitate discussion 03/11/2012   Hyperlipidemia LDL goal <100 04/15/2011   Essential hypertension 02/22/2011   Acquired hypothyroidism 02/22/2011     Physical Exam  Triage Vital  Signs: ED Triage Vitals  Enc Vitals Group     BP 11/08/22 1833 (!) 125/54     Pulse Rate 11/08/22 1833 78     Resp 11/08/22 1833 18     Temp 11/08/22 1915 98.4 F (36.9 C)     Temp Source 11/08/22 1915 Oral     SpO2 11/08/22 1833 92 %     Weight 11/08/22 1832 155 lb 13.8 oz (70.7 kg)     Height 11/08/22 1832 5\' 2"  (1.575 m)     Head Circumference --      Peak Flow --      Pain Score 11/08/22 1831 0     Pain Loc --      Pain Edu? --      Excl. in GC? --     Most recent vital signs: Vitals:   11/08/22 1935 11/08/22 2100  BP: (!) 144/70 (!) 140/64  Pulse: 69 75  Resp: (!) 25 18  Temp: 98.2 F (36.8 C)   SpO2: 96% 98%     General: Awake, no distress.  CV:  Good peripheral perfusion.  No peripheral edema Resp:  Normal effort.  Patient has wet sounding cough, scattered expiratory wheezing at the bases that clears with coughing Abd:  No distention.  Abdomen is soft and nontender throughout Neuro:             Awake, Alert, Oriented x 3  Other:  ED Results / Procedures / Treatments  Labs (all labs ordered are listed, but only abnormal results are displayed) Labs Reviewed  LACTIC ACID, PLASMA - Abnormal; Notable for the following components:      Result Value   Lactic Acid, Venous 2.1 (*)    All other components within normal limits  COMPREHENSIVE METABOLIC PANEL - Abnormal; Notable for the following components:   Sodium 131 (*)    Glucose, Bld 193 (*)    Total Protein 8.2 (*)    Albumin 3.3 (*)    AST 82 (*)    ALT 82 (*)    Alkaline Phosphatase 514 (*)    Total Bilirubin 1.3 (*)    All other components within normal limits  URINALYSIS, ROUTINE W REFLEX MICROSCOPIC - Abnormal; Notable for the following components:   Color, Urine YELLOW (*)    APPearance HAZY (*)    Leukocytes,Ua TRACE (*)    Bacteria, UA MANY (*)    All other components within normal limits  RESP PANEL BY RT-PCR (RSV, FLU A&B, COVID)  RVPGX2  URINE CULTURE  CBC WITH DIFFERENTIAL/PLATELET   LACTIC ACID, PLASMA  LACTIC ACID, PLASMA  LACTIC ACID, PLASMA  LACTIC ACID, PLASMA     EKG     RADIOLOGY I reviewed and interpreted the CXR which does not show any acute cardiopulmonary process   PROCEDURES:  Critical Care performed: No  Procedures  The patient is on the cardiac monitor to evaluate for evidence of arrhythmia and/or significant heart rate changes.   MEDICATIONS ORDERED IN ED: Medications  lactated ringers bolus 1,000 mL (0 mLs Intravenous Stopped 11/08/22 2218)  cefTRIAXone (ROCEPHIN) 1 g in sodium chloride 0.9 % 100 mL IVPB (0 g Intravenous Stopped 11/08/22 2309)  azithromycin (ZITHROMAX) 500 mg in sodium chloride 0.9 % 250 mL IVPB (0 mg Intravenous Stopped 11/08/22 2218)     IMPRESSION / MDM / ASSESSMENT AND PLAN / ED COURSE  I reviewed the triage vital signs and the nursing notes.                              Patient's presentation is most consistent with acute complicated illness / injury requiring diagnostic workup.  Differential diagnosis includes, but is not limited to, pneumonia, viral illness, bronchitis, UTI, sepsis  Patient is a 89 year old female presents with fever and cough.  Cough has been going on for 2 days and is productive of clear and at times yellow sputum.  Today was first day she had a temp of 100.5 which is measured by home health who came out to see her.  Denies vomiting diarrhea or other GI symptoms including abdominal pain.  Denies urinary symptoms.  Patient's temp is normal here.  Initial O2 sat is documented at 92%.  When I am in the room she is at 96% with normal respiratory rate although respiratory rate is documented 25 patient is breathing comfortably.  She does have some expiratory wheezing at the bases that clears with coughing.  Abdominal exam is benign.  Patient is mentating normally and appears well.    Labs are notable for normal CBC, lactate is 2.1 just barely above the upper limit of normal.  LFTs are elevated and  they were during her last admission as well.  Patient is not having abdominal pain essentially asymptomatic this will need to be followed up.  Analysis pending.  Will give a bolus of fluid and obtain a CT of  the chest without contrast to evaluate for occult pneumonia.  Will send COVID and flu testing as well.  COVID and flu testing is negative.  CT chest does suggest possible pneumonia and given her symptoms will cover.  Urinalysis is also suggestive of possible UTI but given patient has no urinary symptoms unclear whether this is just asymptomatic bacteriuria.  Will give a dose of Rocephin and azithromycin in the ED.  Discussed admission versus home with patient and she definitely prefers to go home.  We did ambulate her and her sats remained above 95%.  Repeat lactate is negative.  Her vital signs are stable.  No leukocytosis.  Clinically I do think that she can be treated as an outpatient.  Will prescribe 7 days of cefpodoxime to cover for both urinary and pneumonia.  Will add azithromycin x 4 days for CAP coverage.  Patient is to see her primary doctor tomorrow.      FINAL CLINICAL IMPRESSION(S) / ED DIAGNOSES   Final diagnoses:  Community acquired pneumonia, unspecified laterality  Urinary tract infection without hematuria, site unspecified     Rx / DC Orders   ED Discharge Orders          Ordered    cefpodoxime (VANTIN) 200 MG tablet  2 times daily        11/08/22 2311    azithromycin (ZITHROMAX) 250 MG tablet        11/08/22 2311             Note:  This document was prepared using Dragon voice recognition software and may include unintentional dictation errors.   Georga Hacking, MD 11/08/22 [phone]

## 2022-11-08 NOTE — Telephone Encounter (Signed)
Esther from [facility] called stating pt has a temp of 105, pulse of 81, respiration of 88, BP of 106/75 and O2 level of 95. Darral Dash stated pt has coughed up yellow salvia 2x today and she has diffulty motor planning when walking and lower extemity weakness.

## 2022-11-08 NOTE — Telephone Encounter (Signed)
After reading the original message taken from the home health nurse I call and advised pt's daughter that pt needs to go to the ED to be evaluated. The daughter then told that the vitals in the first message were incorrect that the temperature was 100.5 and the bp was 160/72 and was not sure about the rest of the vitals. The daughter also stated to me that pt has gotten weaker through out the day and coughing up brown mucus according to pt's sisters who are staying with her. Daughter stated that she was leaving work right now and going to get pt. After that I tried calling the home health nurse to clarify vitals, no answer. I then talked to Dr. Clent Ridges and she advised that pt go on over to the ED for evaluation. Called daughter back to confirm that she was taking her to the ED. Daughter was almost home and would get her to the ED.

## 2022-11-08 NOTE — ED Triage Notes (Addendum)
ARrives from home with daughter.  Patient c/o weakness and fever.  Noticed fever today and also productive cough today.  Recent stay at [facility]

## 2022-11-09 ENCOUNTER — Ambulatory Visit (INDEPENDENT_AMBULATORY_CARE_PROVIDER_SITE_OTHER): Payer: Medicare Other | Admitting: Internal Medicine

## 2022-11-09 ENCOUNTER — Encounter: Payer: Self-pay | Admitting: Internal Medicine

## 2022-11-09 VITALS — BP 110/50 | HR 75 | Temp 97.5°F | Ht 62.0 in | Wt 156.2 lb

## 2022-11-09 DIAGNOSIS — N390 Urinary tract infection, site not specified: Secondary | ICD-10-CM

## 2022-11-09 DIAGNOSIS — B9689 Other specified bacterial agents as the cause of diseases classified elsewhere: Secondary | ICD-10-CM

## 2022-11-09 DIAGNOSIS — J189 Pneumonia, unspecified organism: Secondary | ICD-10-CM

## 2022-11-09 MED ORDER — ANORO ELLIPTA 62.5-25 MCG/ACT IN AEPB: 1.0000 | INHALATION_SPRAY | Freq: Every day | RESPIRATORY_TRACT | 1 refills | Status: DC

## 2022-11-09 NOTE — Progress Notes (Unsigned)
Subjective:  Patient ID: Lauren Morris, female    DOB: 06-24-1932  Age: 89 y.o. MRN: [id number]  CC: There were no encounter diagnoses.   HPI Lauren Morris presents for hospital and ER follow up  1) hospitalied April 17 -20 for UTI complicated by lower extremity weakness and decresed vision form eye infection .  Was d sent to SNF.([facility]) for rehab.  Came home one week ago  was doing the home exercises and getting home PT>   ER follow up:  treated and released yesterday for CAP .  Reviewed ER notes:  "COVID and flu testing is negative.  CT chest does suggest possible pneumonia and given her symptoms will cover.  Urinalysis is also suggestive of possible UTI but given patient has no urinary symptoms unclear whether this is just asymptomatic bacteriuria.  Will give a dose of Rocephin and azithromycin in the ED.  SHE HAS NOT PICKED UP THE ABX YET    Discussed admission versus home with patient and she definitely prefers to go home.  We did ambulate her and her sats remained above 95%.  Repeat lactate is negative.  Her vital signs are stable.  No leukocytosis.  Clinically I do think that she can be treated as an outpatient.  Will prescribe 7 days of cefpodoxime to cover for both urinary and pneumonia.  Will add azithromycin x 4 days for CAP coverage.  "  She notes coughing after eating and drinking intermittently   Outpatient Medications Prior to Visit  Medication Sig Dispense Refill   albuterol (PROVENTIL) (2.5 MG/3ML) 0.083% nebulizer solution Take 3 mLs (2.5 mg total) by nebulization every 6 (six) hours as needed for wheezing or shortness of breath. 75 mL 12   Ascorbic Acid (VITAMIN C) 1000 MG tablet Take 1 tablet (1,000 mg total) by mouth daily. 90 tablet 3   Aspirin 81 MG CAPS Take 1 capsule by mouth daily at 6 (six) AM.     atenolol (TENORMIN) 25 MG tablet TAKE 1 TABLET BY MOUTH TWICE DAILY 180 tablet 1   azithromycin (ZITHROMAX) 250 MG tablet Take 1 pill daily for the next 4 days 4 each 0    Calcium Carb-Cholecalciferol (CALCIUM 1000 + D PO) Take 1 tablet by mouth daily.     cefpodoxime (VANTIN) 200 MG tablet Take 1 tablet (200 mg total) by mouth 2 (two) times daily for 7 days. 14 tablet 0   dorzolamide-timolol (COSOPT) 22.3-6.8 MG/ML ophthalmic solution Place 1 drop into both eyes 2 (two) times daily.     famotidine (PEPCID) 20 MG tablet TAKE 1 TABLET BY MOUTH 2 TIMES DAILY 180 tablet 1   fish oil-omega-3 fatty acids 1000 MG capsule Take 2 capsules (2 g total) by mouth daily. 6 capsule 11   guaiFENesin (MUCINEX) 600 MG 12 hr tablet Take 1 tablet (600 mg total) by mouth 2 (two) times daily.     melatonin 5 MG TABS Take 1 tablet (5 mg total) by mouth at bedtime as needed. 30 tablet 0   Multiple Vitamin (MULTIVITAMIN WITH MINERALS) TABS tablet Take 1 tablet by mouth daily. 30 tablet 0   RESTASIS 0.05 % ophthalmic emulsion Place 1 drop into both eyes 2 (two) times daily.     telmisartan (MICARDIS) 40 MG tablet TAKE 1 TABLET BY MOUTH AT BEDTIME 90 tablet 1   umeclidinium-vilanterol (ANORO ELLIPTA) 62.5-25 MCG/ACT AEPB Inhale 1 puff into the lungs daily. 1 each 1   valACYclovir (VALTREX) 500 MG tablet Take 500 mg  by mouth at bedtime.     Zinc Oxide (TRIPLE PASTE) 12.8 % ointment Apply 1 Application topically. Every shift     zinc sulfate 220 (50 Zn) MG capsule Take 1 capsule (220 mg total) by mouth daily. 30 capsule 11   No facility-administered medications prior to visit.    Review of Systems;  Patient denies headache, fevers, malaise, unintentional weight loss, skin rash, eye pain, sinus congestion and sinus pain, sore throat, dysphagia,  hemoptysis , cough, dyspnea, wheezing, chest pain, palpitations, orthopnea, edema, abdominal pain, nausea, melena, diarrhea, constipation, flank pain, dysuria, hematuria, urinary  Frequency, nocturia, numbness, tingling, seizures,  Focal weakness, Loss of consciousness,  Tremor, insomnia, depression, anxiety, and suicidal ideation.      Objective:   There were no vitals taken for this visit.  BP Readings from Last 3 Encounters:  11/08/22 (!) 145/133  10/15/22 130/78  10/13/22 (!) 131/59    Wt Readings from Last 3 Encounters:  11/08/22 155 lb 13.8 oz (70.7 kg)  10/15/22 155 lb 12.8 oz (70.7 kg)  10/10/22 158 lb (71.7 kg)    Physical Exam  Lab Results  Component Value Date   HGBA1C 6.0 05/07/2022   HGBA1C 6.1 09/04/2021   HGBA1C 6.1 05/25/2021    Lab Results  Component Value Date   CREATININE 0.91 11/08/2022   CREATININE 0.71 10/11/2022   CREATININE 0.99 10/10/2022    Lab Results  Component Value Date   WBC 7.9 11/08/2022   HGB 14.2 11/08/2022   HCT 43.4 11/08/2022   PLT 169 11/08/2022   GLUCOSE 193 (H) 11/08/2022   CHOL 207 (H) 05/07/2022   TRIG 166.0 (H) 05/07/2022   HDL 37.00 (L) 05/07/2022   LDLDIRECT 120.0 05/07/2022   LDLCALC 137 (H) 05/07/2022   ALT 82 (H) 11/08/2022   AST 82 (H) 11/08/2022   NA 131 (L) 11/08/2022   K 3.8 11/08/2022   CL 100 11/08/2022   CREATININE 0.91 11/08/2022   BUN 17 11/08/2022   CO2 22 11/08/2022   TSH 5.46 02/16/2022   INR 1.2 10/10/2022   HGBA1C 6.0 05/07/2022   MICROALBUR 1.6 05/07/2022    CT CHEST WO CONTRAST  Addendum Date: 11/08/2022   ADDENDUM REPORT: 11/08/2022 20:33 ADDENDUM: Following should be added to the impression There are a few small patchy alveolar densities in the posterior lower lung fields largest measuring 9 mm in diameter in the left lower lobe. This may be part of pneumonia. Follow-up CT in 3 months should be considered to rule out a parenchymal nodule such as neoplasm. Electronically Signed   By: Ernie Avena M.D.   On: 11/08/2022 20:33   Result Date: 11/08/2022 CLINICAL DATA:  Fever, productive cough EXAM: CT CHEST WITHOUT CONTRAST TECHNIQUE: Multidetector CT imaging of the chest was performed following the standard protocol without IV contrast. RADIATION DOSE REDUCTION: This exam was performed according to the departmental  dose-optimization program which includes automated exposure control, adjustment of the mA and/or kV according to patient size and/or use of iterative reconstruction technique. COMPARISON:  08/02/2021 FINDINGS: Cardiovascular: Coronary artery calcifications are seen. Calcifications are seen in mitral annulus. There are scattered calcifications in thoracic aorta and its major branches. Mediastinum/Nodes: There are few slightly enlarged lymph nodes and mediastinum largest in the precarinal region with no significant interval change. Lungs/Pleura: There are small scattered foci of ground-glass densities in the parahilar regions. Small patchy alveolar densities are seen in both lower lobes largest measuring 9 mm in diameter. There is no focal pulmonary consolidation.  There is peribronchial thickening suggesting bronchitis. There is no pleural effusion or pneumothorax. Upper Abdomen: Arterial calcifications are seen in abdominal aorta and its branches. There are enlarged lymph nodes along the lesser curvature aspect of the stomach measuring up to 1.2 cm in short axis. Similar findings were seen in the previous CT abdomen done on 08/02/2021. Musculoskeletal: No acute findings are seen. IMPRESSION: There is no focal pulmonary consolidation. There are patchy and ground-glass densities in both parahilar regions suggesting scarring or interstitial pneumonia. Small linear patchy densities in the lower lung fields may suggest subsegmental atelectasis or pneumonia. There is peribronchial thickening suggesting bronchitis. Coronary artery disease.  Calcifications are seen in mitral annulus. Slightly enlarged lymph nodes in mediastinum and upper abdomen have not changed significantly. Electronically Signed: By: Ernie Avena M.D. On: 11/08/2022 20:22   DG Chest 2 View  Result Date: 11/08/2022 CLINICAL DATA:  Cough, fever EXAM: CHEST - 2 VIEW COMPARISON:  Previous studies including the examination of 10/12/2022 FINDINGS:  Transverse diameter of heart is increased. Central pulmonary vessels are less prominent. There is improvement in the aeration of both lungs. There are no new focal infiltrates. There is no significant pleural effusion or pneumothorax. IMPRESSION: There is interval decrease in pulmonary vascular congestion and improvement in aeration in both lungs. There are no new infiltrates or signs of pulmonary edema. There is no significant pleural effusion or pneumothorax. Electronically Signed   By: Ernie Avena M.D.   On: 11/08/2022 19:07    Assessment & Plan:  .There are no diagnoses linked to this encounter.   I provided 30 minutes of face-to-face time during this encounter reviewing patient's last visit with me, patient's  most recent visit with cardiology,  nephrology,  and neurology,  recent surgical and non surgical procedures, previous  labs and imaging studies, counseling on currently addressed issues,  and post visit ordering to diagnostics and therapeutics .   Follow-up: No follow-ups on file.   Sherlene Shams, MD

## 2022-11-09 NOTE — Patient Instructions (Addendum)
You do not need the multivitamin  or the zinc anymore  You were diagnosed with pneumonia and bronchitis   Continue using the albuterol neb and increase if needed to  every 6 hour  Resume the Anoro Ellipta that they gave you at [facility] (rx sent to [facility]) This is for your breathing    You should start  both antibiotics (azithromycin and cefpodoxime) as soon as possible, take them with meals to prevent nausea  Please take a probiotic ( Align, Floraque or Culturelle), tor eat a serving of yogurt   Yogurt, or another dietary source) for a minimum of 3 weeks to prevent a serious antibiotic associated diarrhea  Called clostridium dificile colitis.  Taking a probiotic may also prevent vaginitis due to yeast infections and can be continued indefinitely if you feel that it improves your digestion or your elimination (bowels).    Use  delsym for the cough   Maintain good hydration throughtout your illness

## 2022-11-10 LAB — URINE CULTURE: Culture: >=100000 — AB

## 2022-11-11 DIAGNOSIS — B9689 Other specified bacterial agents as the cause of diseases classified elsewhere: Secondary | ICD-10-CM | POA: Insufficient documentation

## 2022-11-11 DIAGNOSIS — N390 Urinary tract infection, site not specified: Secondary | ICD-10-CM | POA: Insufficient documentation

## 2022-11-11 LAB — URINE CULTURE

## 2022-11-11 NOTE — Assessment & Plan Note (Signed)
Empiric CAP treatment  with cefpdoxime

## 2022-11-11 NOTE — Assessment & Plan Note (Signed)
Bilateral LL infiltrates noted on chst CT.  Advised to continue azithromycin and cefpodoxime   Repeat CT needed I 3 months to ensure resolution

## 2022-11-13 ENCOUNTER — Telehealth: Payer: Self-pay

## 2022-11-13 DIAGNOSIS — M1711 Unilateral primary osteoarthritis, right knee: Secondary | ICD-10-CM | POA: Diagnosis not present

## 2022-11-13 DIAGNOSIS — A419 Sepsis, unspecified organism: Secondary | ICD-10-CM | POA: Diagnosis not present

## 2022-11-13 DIAGNOSIS — E119 Type 2 diabetes mellitus without complications: Secondary | ICD-10-CM | POA: Diagnosis not present

## 2022-11-13 DIAGNOSIS — J410 Simple chronic bronchitis: Secondary | ICD-10-CM | POA: Diagnosis not present

## 2022-11-13 DIAGNOSIS — N390 Urinary tract infection, site not specified: Secondary | ICD-10-CM | POA: Diagnosis not present

## 2022-11-13 DIAGNOSIS — I10 Essential (primary) hypertension: Secondary | ICD-10-CM | POA: Diagnosis not present

## 2022-11-13 NOTE — Telephone Encounter (Signed)
Transition Care Management Follow-up Telephone Call Date of discharge and from where: [facility] 5/16 How have you been since you were released from the hospital? [facility] Any questions or concerns? No  Items Reviewed: Did the pt receive and understand the discharge instructions provided? Yes  Medications obtained and verified? Yes  Other? No  Any new allergies since your discharge? No  Dietary orders reviewed? No Do you have support at home? Yes at night    Follow up appointments reviewed:  PCP Hospital f/u appt confirmed? Yes  Scheduled to see PCP on 5/17 @ . Specialist Hospital f/u appt confirmed? Yes  Scheduled to see  on  @ . Are transportation arrangements needed? No  If their condition worsens, is the pt aware to call PCP or go to the Emergency Dept.? Yes Was the patient provided with contact information for the PCP's office or ED? Yes Was to pt encouraged to call back with questions or concerns? Yes

## 2022-11-16 DIAGNOSIS — E119 Type 2 diabetes mellitus without complications: Secondary | ICD-10-CM | POA: Diagnosis not present

## 2022-11-16 DIAGNOSIS — M1711 Unilateral primary osteoarthritis, right knee: Secondary | ICD-10-CM | POA: Diagnosis not present

## 2022-11-16 DIAGNOSIS — I10 Essential (primary) hypertension: Secondary | ICD-10-CM | POA: Diagnosis not present

## 2022-11-16 DIAGNOSIS — N390 Urinary tract infection, site not specified: Secondary | ICD-10-CM | POA: Diagnosis not present

## 2022-11-16 DIAGNOSIS — A419 Sepsis, unspecified organism: Secondary | ICD-10-CM | POA: Diagnosis not present

## 2022-11-16 DIAGNOSIS — J410 Simple chronic bronchitis: Secondary | ICD-10-CM | POA: Diagnosis not present

## 2022-11-20 ENCOUNTER — Telehealth: Payer: Self-pay | Admitting: Internal Medicine

## 2022-11-20 ENCOUNTER — Other Ambulatory Visit: Payer: Self-pay

## 2022-11-20 DIAGNOSIS — H401131 Primary open-angle glaucoma, bilateral, mild stage: Secondary | ICD-10-CM | POA: Diagnosis not present

## 2022-11-20 DIAGNOSIS — H18232 Secondary corneal edema, left eye: Secondary | ICD-10-CM | POA: Diagnosis not present

## 2022-11-20 DIAGNOSIS — B0052 Herpesviral keratitis: Secondary | ICD-10-CM | POA: Diagnosis not present

## 2022-11-20 DIAGNOSIS — H16223 Keratoconjunctivitis sicca, not specified as Sjogren's, bilateral: Secondary | ICD-10-CM | POA: Diagnosis not present

## 2022-11-20 NOTE — Telephone Encounter (Signed)
Spoke with pt and pt's daughter to find out which inhaler pt was needing a refill on, it is the [place]. However pt stated that she has been on this inhaler for a very long time and didn't know if she needed to continue with it or not. The reason she is questioning it is because she was started on Anoro when she was diagnosed with pneumonia so she isn't sure if she needs to continue with both or just the [place] or just the Anoro.

## 2022-11-20 NOTE — Telephone Encounter (Signed)
Patient called and said that [facility] pharmacy does not have in stock her inhaler. Patient spelled out 3 different  inhalers and none are on patient 's list. Patient states she got the on inhaler from our office.   Please call patient, she will be leaving her house for a doctors appoitment around 12:15 pm and will not be back until 2:30 pm.

## 2022-11-20 NOTE — Telephone Encounter (Signed)
Spoke with pt and informed her of the message below. Pt gave a verbal understanding.  

## 2022-11-21 DIAGNOSIS — A419 Sepsis, unspecified organism: Secondary | ICD-10-CM | POA: Diagnosis not present

## 2022-11-21 DIAGNOSIS — E119 Type 2 diabetes mellitus without complications: Secondary | ICD-10-CM | POA: Diagnosis not present

## 2022-11-21 DIAGNOSIS — J410 Simple chronic bronchitis: Secondary | ICD-10-CM | POA: Diagnosis not present

## 2022-11-21 DIAGNOSIS — N390 Urinary tract infection, site not specified: Secondary | ICD-10-CM | POA: Diagnosis not present

## 2022-11-21 DIAGNOSIS — I10 Essential (primary) hypertension: Secondary | ICD-10-CM | POA: Diagnosis not present

## 2022-11-21 DIAGNOSIS — M1711 Unilateral primary osteoarthritis, right knee: Secondary | ICD-10-CM | POA: Diagnosis not present

## 2022-11-23 DIAGNOSIS — M1711 Unilateral primary osteoarthritis, right knee: Secondary | ICD-10-CM | POA: Diagnosis not present

## 2022-11-23 DIAGNOSIS — A419 Sepsis, unspecified organism: Secondary | ICD-10-CM | POA: Diagnosis not present

## 2022-11-23 DIAGNOSIS — I10 Essential (primary) hypertension: Secondary | ICD-10-CM | POA: Diagnosis not present

## 2022-11-23 DIAGNOSIS — N390 Urinary tract infection, site not specified: Secondary | ICD-10-CM | POA: Diagnosis not present

## 2022-11-23 DIAGNOSIS — J410 Simple chronic bronchitis: Secondary | ICD-10-CM | POA: Diagnosis not present

## 2022-11-23 DIAGNOSIS — E119 Type 2 diabetes mellitus without complications: Secondary | ICD-10-CM | POA: Diagnosis not present

## 2022-11-26 ENCOUNTER — Encounter: Payer: Self-pay | Admitting: Internal Medicine

## 2022-11-26 ENCOUNTER — Ambulatory Visit (INDEPENDENT_AMBULATORY_CARE_PROVIDER_SITE_OTHER): Payer: Medicare Other | Admitting: Internal Medicine

## 2022-11-26 VITALS — BP 126/56 | HR 73 | Temp 97.7°F | Ht 62.0 in | Wt 154.8 lb

## 2022-11-26 DIAGNOSIS — J189 Pneumonia, unspecified organism: Secondary | ICD-10-CM

## 2022-11-26 DIAGNOSIS — J410 Simple chronic bronchitis: Secondary | ICD-10-CM

## 2022-11-26 DIAGNOSIS — R918 Other nonspecific abnormal finding of lung field: Secondary | ICD-10-CM

## 2022-11-26 MED ORDER — ANORO ELLIPTA 62.5-25 MCG/ACT IN AEPB
1.0000 | INHALATION_SPRAY | Freq: Every day | RESPIRATORY_TRACT | 5 refills | Status: DC
Start: 2022-11-26 — End: 2023-10-24

## 2022-11-26 NOTE — Patient Instructions (Signed)
Try supplementing your diet with Ensure or Boost once  for protein intake   Anoro has been sent to [facility] for future refills   I will order Repeat  CT for mid August and scheudule a follow up after that

## 2022-11-26 NOTE — Progress Notes (Unsigned)
Subjective:  Patient ID: Lauren Morris, female    DOB: January 01, 1932  Age: 89 y.o. MRN: [id number]  CC: There were no encounter diagnoses.   HPI Lauren Morris presents for  Chief Complaint  Patient presents with   Medical Management of Chronic Issues    2 week follow up    1) Follow up on pneumonia. Diagnosed in ER  2 weeks ago .CT noted patchy densities.    Her cough has resolved,  started sneezing today.  .  Feeling somewhat better.   Appetite better.  Energy is better some days than others.  Sleeping well but interrupted by nocturia x 4-6 times per night.  .  Avoids drinking liquids at night . NEEDS REFILL ON ANORO.  GETTING PT 2/WEEK,  using a walker when alone and making it to her mailbox with PT using Cane  PT is being reduced to once week   eating yogurt daily as her probiotic   2) Recent UTI :  no symptoms.   Bilateral rales at bases.   Outpatient Medications Prior to Visit  Medication Sig Dispense Refill   albuterol (PROVENTIL) (2.5 MG/3ML) 0.083% nebulizer solution Take 3 mLs (2.5 mg total) by nebulization every 6 (six) hours as needed for wheezing or shortness of breath. 75 mL 12   Ascorbic Acid (VITAMIN C) 1000 MG tablet Take 1 tablet (1,000 mg total) by mouth daily. 90 tablet 3   Aspirin 81 MG CAPS Take 1 capsule by mouth daily at 6 (six) AM.     atenolol (TENORMIN) 25 MG tablet TAKE 1 TABLET BY MOUTH TWICE DAILY 180 tablet 1   azithromycin (ZITHROMAX) 250 MG tablet Take 1 pill daily for the next 4 days 4 each 0   Calcium Carb-Cholecalciferol (CALCIUM 1000 + D PO) Take 1 tablet by mouth daily.     dorzolamide-timolol (COSOPT) 22.3-6.8 MG/ML ophthalmic solution Place 1 drop into both eyes 2 (two) times daily.     famotidine (PEPCID) 20 MG tablet TAKE 1 TABLET BY MOUTH 2 TIMES DAILY 180 tablet 1   fish oil-omega-3 fatty acids 1000 MG capsule Take 2 capsules (2 g total) by mouth daily. 6 capsule 11   guaiFENesin (MUCINEX) 600 MG 12 hr tablet Take 1 tablet (600 mg total) by mouth 2  (two) times daily.     melatonin 5 MG TABS Take 1 tablet (5 mg total) by mouth at bedtime as needed. 30 tablet 0   Multiple Vitamin (MULTIVITAMIN WITH MINERALS) TABS tablet Take 1 tablet by mouth daily. 30 tablet 0   RESTASIS 0.05 % ophthalmic emulsion Place 1 drop into both eyes 2 (two) times daily.     telmisartan (MICARDIS) 40 MG tablet TAKE 1 TABLET BY MOUTH AT BEDTIME 90 tablet 1   umeclidinium-vilanterol (ANORO ELLIPTA) 62.5-25 MCG/ACT AEPB Inhale 1 puff into the lungs daily. 1 each 1   valACYclovir (VALTREX) 500 MG tablet Take 500 mg by mouth at bedtime.     Zinc Oxide (TRIPLE PASTE) 12.8 % ointment Apply 1 Application topically. Every shift     zinc sulfate 220 (50 Zn) MG capsule Take 1 capsule (220 mg total) by mouth daily. 30 capsule 11   No facility-administered medications prior to visit.    Review of Systems;  Patient denies headache, fevers, malaise, unintentional weight loss, skin rash, eye pain, sinus congestion and sinus pain, sore throat, dysphagia,  hemoptysis , cough, dyspnea, wheezing, chest pain, palpitations, orthopnea, edema, abdominal pain, nausea, melena, diarrhea, constipation, flank pain,  dysuria, hematuria, urinary  Frequency, nocturia, numbness, tingling, seizures,  Focal weakness, Loss of consciousness,  Tremor, insomnia, depression, anxiety, and suicidal ideation.      Objective:  BP (!) 126/56   Pulse 73   Temp 97.7 F (36.5 C) (Oral)   Ht 5\' 2"  (1.575 m)   Wt 154 lb 12.8 oz (70.2 kg)   SpO2 94%   BMI 28.31 kg/m   BP Readings from Last 3 Encounters:  11/26/22 (!) 126/56  11/09/22 (!) 110/50  11/08/22 (!) 145/133    Wt Readings from Last 3 Encounters:  11/26/22 154 lb 12.8 oz (70.2 kg)  11/09/22 156 lb 3.2 oz (70.9 kg)  11/08/22 155 lb 13.8 oz (70.7 kg)    Physical Exam  Lab Results  Component Value Date   HGBA1C 6.0 05/07/2022   HGBA1C 6.1 09/04/2021   HGBA1C 6.1 05/25/2021    Lab Results  Component Value Date   CREATININE 0.91  11/08/2022   CREATININE 0.71 10/11/2022   CREATININE 0.99 10/10/2022    Lab Results  Component Value Date   WBC 7.9 11/08/2022   HGB 14.2 11/08/2022   HCT 43.4 11/08/2022   PLT 169 11/08/2022   GLUCOSE 193 (H) 11/08/2022   CHOL 207 (H) 05/07/2022   TRIG 166.0 (H) 05/07/2022   HDL 37.00 (L) 05/07/2022   LDLDIRECT 120.0 05/07/2022   LDLCALC 137 (H) 05/07/2022   ALT 82 (H) 11/08/2022   AST 82 (H) 11/08/2022   NA 131 (L) 11/08/2022   K 3.8 11/08/2022   CL 100 11/08/2022   CREATININE 0.91 11/08/2022   BUN 17 11/08/2022   CO2 22 11/08/2022   TSH 5.46 02/16/2022   INR 1.2 10/10/2022   HGBA1C 6.0 05/07/2022   MICROALBUR 1.6 05/07/2022    CT CHEST WO CONTRAST  Addendum Date: 11/08/2022   ADDENDUM REPORT: 11/08/2022 20:33 ADDENDUM: Following should be added to the impression There are a few small patchy alveolar densities in the posterior lower lung fields largest measuring 9 mm in diameter in the left lower lobe. This may be part of pneumonia. Follow-up CT in 3 months should be considered to rule out a parenchymal nodule such as neoplasm. Electronically Signed   By: Ernie Avena M.D.   On: 11/08/2022 20:33   Result Date: 11/08/2022 CLINICAL DATA:  Fever, productive cough EXAM: CT CHEST WITHOUT CONTRAST TECHNIQUE: Multidetector CT imaging of the chest was performed following the standard protocol without IV contrast. RADIATION DOSE REDUCTION: This exam was performed according to the departmental dose-optimization program which includes automated exposure control, adjustment of the mA and/or kV according to patient size and/or use of iterative reconstruction technique. COMPARISON:  08/02/2021 FINDINGS: Cardiovascular: Coronary artery calcifications are seen. Calcifications are seen in mitral annulus. There are scattered calcifications in thoracic aorta and its major branches. Mediastinum/Nodes: There are few slightly enlarged lymph nodes and mediastinum largest in the precarinal region  with no significant interval change. Lungs/Pleura: There are small scattered foci of ground-glass densities in the parahilar regions. Small patchy alveolar densities are seen in both lower lobes largest measuring 9 mm in diameter. There is no focal pulmonary consolidation. There is peribronchial thickening suggesting bronchitis. There is no pleural effusion or pneumothorax. Upper Abdomen: Arterial calcifications are seen in abdominal aorta and its branches. There are enlarged lymph nodes along the lesser curvature aspect of the stomach measuring up to 1.2 cm in short axis. Similar findings were seen in the previous CT abdomen done on 08/02/2021. Musculoskeletal: No acute findings  are seen. IMPRESSION: There is no focal pulmonary consolidation. There are patchy and ground-glass densities in both parahilar regions suggesting scarring or interstitial pneumonia. Small linear patchy densities in the lower lung fields may suggest subsegmental atelectasis or pneumonia. There is peribronchial thickening suggesting bronchitis. Coronary artery disease.  Calcifications are seen in mitral annulus. Slightly enlarged lymph nodes in mediastinum and upper abdomen have not changed significantly. Electronically Signed: By: Ernie Avena M.D. On: 11/08/2022 20:22   DG Chest 2 View  Result Date: 11/08/2022 CLINICAL DATA:  Cough, fever EXAM: CHEST - 2 VIEW COMPARISON:  Previous studies including the examination of 10/12/2022 FINDINGS: Transverse diameter of heart is increased. Central pulmonary vessels are less prominent. There is improvement in the aeration of both lungs. There are no new focal infiltrates. There is no significant pleural effusion or pneumothorax. IMPRESSION: There is interval decrease in pulmonary vascular congestion and improvement in aeration in both lungs. There are no new infiltrates or signs of pulmonary edema. There is no significant pleural effusion or pneumothorax. Electronically Signed   By: Ernie Avena M.D.   On: 11/08/2022 19:07    Assessment & Plan:  .There are no diagnoses linked to this encounter.   I provided 30 minutes of face-to-face time during this encounter reviewing patient's last visit with me, patient's  most recent visit with cardiology,  nephrology,  and neurology,  recent surgical and non surgical procedures, previous  labs and imaging studies, counseling on currently addressed issues,  and post visit ordering to diagnostics and therapeutics .   Follow-up: No follow-ups on file.   Sherlene Shams, MD

## 2022-11-27 DIAGNOSIS — J410 Simple chronic bronchitis: Secondary | ICD-10-CM | POA: Insufficient documentation

## 2022-11-27 NOTE — Assessment & Plan Note (Signed)
Continue Anoro 

## 2022-11-27 NOTE — Assessment & Plan Note (Signed)
Clinically resolved wit persistent bilateral LL rales.  Repeat CT in 3 months

## 2022-11-27 NOTE — Assessment & Plan Note (Signed)
Adding spiriva

## 2022-11-28 DIAGNOSIS — N390 Urinary tract infection, site not specified: Secondary | ICD-10-CM | POA: Diagnosis not present

## 2022-11-28 DIAGNOSIS — E119 Type 2 diabetes mellitus without complications: Secondary | ICD-10-CM | POA: Diagnosis not present

## 2022-11-28 DIAGNOSIS — M1711 Unilateral primary osteoarthritis, right knee: Secondary | ICD-10-CM | POA: Diagnosis not present

## 2022-11-28 DIAGNOSIS — A419 Sepsis, unspecified organism: Secondary | ICD-10-CM | POA: Diagnosis not present

## 2022-11-28 DIAGNOSIS — J410 Simple chronic bronchitis: Secondary | ICD-10-CM | POA: Diagnosis not present

## 2022-11-28 DIAGNOSIS — I10 Essential (primary) hypertension: Secondary | ICD-10-CM | POA: Diagnosis not present

## 2022-12-03 DIAGNOSIS — N390 Urinary tract infection, site not specified: Secondary | ICD-10-CM | POA: Diagnosis not present

## 2022-12-03 DIAGNOSIS — J410 Simple chronic bronchitis: Secondary | ICD-10-CM | POA: Diagnosis not present

## 2022-12-03 DIAGNOSIS — A419 Sepsis, unspecified organism: Secondary | ICD-10-CM | POA: Diagnosis not present

## 2022-12-03 DIAGNOSIS — M1711 Unilateral primary osteoarthritis, right knee: Secondary | ICD-10-CM | POA: Diagnosis not present

## 2022-12-03 DIAGNOSIS — E119 Type 2 diabetes mellitus without complications: Secondary | ICD-10-CM | POA: Diagnosis not present

## 2022-12-03 DIAGNOSIS — I10 Essential (primary) hypertension: Secondary | ICD-10-CM | POA: Diagnosis not present

## 2022-12-05 DIAGNOSIS — I10 Essential (primary) hypertension: Secondary | ICD-10-CM | POA: Diagnosis not present

## 2022-12-05 DIAGNOSIS — Z79899 Other long term (current) drug therapy: Secondary | ICD-10-CM | POA: Diagnosis not present

## 2022-12-05 DIAGNOSIS — M1711 Unilateral primary osteoarthritis, right knee: Secondary | ICD-10-CM | POA: Diagnosis not present

## 2022-12-05 DIAGNOSIS — K219 Gastro-esophageal reflux disease without esophagitis: Secondary | ICD-10-CM | POA: Diagnosis not present

## 2022-12-05 DIAGNOSIS — G4730 Sleep apnea, unspecified: Secondary | ICD-10-CM | POA: Diagnosis not present

## 2022-12-05 DIAGNOSIS — G4733 Obstructive sleep apnea (adult) (pediatric): Secondary | ICD-10-CM | POA: Diagnosis not present

## 2022-12-05 DIAGNOSIS — J410 Simple chronic bronchitis: Secondary | ICD-10-CM | POA: Diagnosis not present

## 2022-12-05 DIAGNOSIS — H409 Unspecified glaucoma: Secondary | ICD-10-CM | POA: Diagnosis not present

## 2022-12-05 DIAGNOSIS — Z9181 History of falling: Secondary | ICD-10-CM | POA: Diagnosis not present

## 2022-12-05 DIAGNOSIS — E669 Obesity, unspecified: Secondary | ICD-10-CM | POA: Diagnosis not present

## 2022-12-05 DIAGNOSIS — Z8616 Personal history of COVID-19: Secondary | ICD-10-CM | POA: Diagnosis not present

## 2022-12-05 DIAGNOSIS — Z6829 Body mass index (BMI) 29.0-29.9, adult: Secondary | ICD-10-CM | POA: Diagnosis not present

## 2022-12-05 DIAGNOSIS — Z556 Problems related to health literacy: Secondary | ICD-10-CM | POA: Diagnosis not present

## 2022-12-05 DIAGNOSIS — I959 Hypotension, unspecified: Secondary | ICD-10-CM | POA: Diagnosis not present

## 2022-12-05 DIAGNOSIS — E119 Type 2 diabetes mellitus without complications: Secondary | ICD-10-CM | POA: Diagnosis not present

## 2022-12-05 DIAGNOSIS — K7460 Unspecified cirrhosis of liver: Secondary | ICD-10-CM | POA: Diagnosis not present

## 2022-12-05 DIAGNOSIS — Z8701 Personal history of pneumonia (recurrent): Secondary | ICD-10-CM | POA: Diagnosis not present

## 2022-12-05 DIAGNOSIS — H018 Other specified inflammations of eyelid: Secondary | ICD-10-CM | POA: Diagnosis not present

## 2022-12-05 DIAGNOSIS — N390 Urinary tract infection, site not specified: Secondary | ICD-10-CM | POA: Diagnosis not present

## 2022-12-05 DIAGNOSIS — E0500 Thyrotoxicosis with diffuse goiter without thyrotoxic crisis or storm: Secondary | ICD-10-CM | POA: Diagnosis not present

## 2022-12-05 DIAGNOSIS — A419 Sepsis, unspecified organism: Secondary | ICD-10-CM | POA: Diagnosis not present

## 2022-12-05 DIAGNOSIS — Z90710 Acquired absence of both cervix and uterus: Secondary | ICD-10-CM | POA: Diagnosis not present

## 2022-12-05 DIAGNOSIS — E039 Hypothyroidism, unspecified: Secondary | ICD-10-CM | POA: Diagnosis not present

## 2022-12-06 ENCOUNTER — Telehealth: Payer: Self-pay | Admitting: Internal Medicine

## 2022-12-06 ENCOUNTER — Ambulatory Visit: Payer: Medicare Other

## 2022-12-06 NOTE — Telephone Encounter (Signed)
tiffany from [facility] called stating she need confirmation to see if pt has the following diagnosis: COPD, venous insufficiency, BPV, benign neoplasm and cerebral meninges

## 2022-12-06 NOTE — Telephone Encounter (Signed)
Advised Tiffany that per pt's current problem list the only one that she has is the cerebral meningoma

## 2022-12-10 DIAGNOSIS — J410 Simple chronic bronchitis: Secondary | ICD-10-CM | POA: Diagnosis not present

## 2022-12-10 DIAGNOSIS — N390 Urinary tract infection, site not specified: Secondary | ICD-10-CM | POA: Diagnosis not present

## 2022-12-10 DIAGNOSIS — I10 Essential (primary) hypertension: Secondary | ICD-10-CM | POA: Diagnosis not present

## 2022-12-10 DIAGNOSIS — M1711 Unilateral primary osteoarthritis, right knee: Secondary | ICD-10-CM | POA: Diagnosis not present

## 2022-12-10 DIAGNOSIS — A419 Sepsis, unspecified organism: Secondary | ICD-10-CM | POA: Diagnosis not present

## 2022-12-10 DIAGNOSIS — E119 Type 2 diabetes mellitus without complications: Secondary | ICD-10-CM | POA: Diagnosis not present

## 2022-12-24 DIAGNOSIS — A419 Sepsis, unspecified organism: Secondary | ICD-10-CM | POA: Diagnosis not present

## 2022-12-24 DIAGNOSIS — M1711 Unilateral primary osteoarthritis, right knee: Secondary | ICD-10-CM | POA: Diagnosis not present

## 2022-12-24 DIAGNOSIS — N390 Urinary tract infection, site not specified: Secondary | ICD-10-CM | POA: Diagnosis not present

## 2022-12-24 DIAGNOSIS — E119 Type 2 diabetes mellitus without complications: Secondary | ICD-10-CM | POA: Diagnosis not present

## 2022-12-24 DIAGNOSIS — I10 Essential (primary) hypertension: Secondary | ICD-10-CM | POA: Diagnosis not present

## 2022-12-24 DIAGNOSIS — J410 Simple chronic bronchitis: Secondary | ICD-10-CM | POA: Diagnosis not present

## 2022-12-28 DIAGNOSIS — H35373 Puckering of macula, bilateral: Secondary | ICD-10-CM | POA: Diagnosis not present

## 2022-12-28 DIAGNOSIS — H168 Other keratitis: Secondary | ICD-10-CM | POA: Diagnosis not present

## 2022-12-28 DIAGNOSIS — B0052 Herpesviral keratitis: Secondary | ICD-10-CM | POA: Diagnosis not present

## 2022-12-28 DIAGNOSIS — H16223 Keratoconjunctivitis sicca, not specified as Sjogren's, bilateral: Secondary | ICD-10-CM | POA: Diagnosis not present

## 2022-12-28 DIAGNOSIS — H401131 Primary open-angle glaucoma, bilateral, mild stage: Secondary | ICD-10-CM | POA: Diagnosis not present

## 2022-12-28 DIAGNOSIS — H1789 Other corneal scars and opacities: Secondary | ICD-10-CM | POA: Diagnosis not present

## 2023-01-01 DIAGNOSIS — J410 Simple chronic bronchitis: Secondary | ICD-10-CM | POA: Diagnosis not present

## 2023-01-01 DIAGNOSIS — I10 Essential (primary) hypertension: Secondary | ICD-10-CM | POA: Diagnosis not present

## 2023-01-01 DIAGNOSIS — E119 Type 2 diabetes mellitus without complications: Secondary | ICD-10-CM | POA: Diagnosis not present

## 2023-01-01 DIAGNOSIS — N390 Urinary tract infection, site not specified: Secondary | ICD-10-CM | POA: Diagnosis not present

## 2023-01-01 DIAGNOSIS — A419 Sepsis, unspecified organism: Secondary | ICD-10-CM | POA: Diagnosis not present

## 2023-01-01 DIAGNOSIS — M1711 Unilateral primary osteoarthritis, right knee: Secondary | ICD-10-CM | POA: Diagnosis not present

## 2023-01-12 ENCOUNTER — Other Ambulatory Visit: Payer: Self-pay | Admitting: Internal Medicine

## 2023-01-24 DIAGNOSIS — E119 Type 2 diabetes mellitus without complications: Secondary | ICD-10-CM | POA: Diagnosis not present

## 2023-01-24 DIAGNOSIS — K219 Gastro-esophageal reflux disease without esophagitis: Secondary | ICD-10-CM

## 2023-01-24 DIAGNOSIS — Z556 Problems related to health literacy: Secondary | ICD-10-CM

## 2023-01-24 DIAGNOSIS — A419 Sepsis, unspecified organism: Secondary | ICD-10-CM | POA: Diagnosis not present

## 2023-01-24 DIAGNOSIS — H018 Other specified inflammations of eyelid: Secondary | ICD-10-CM | POA: Diagnosis not present

## 2023-01-24 DIAGNOSIS — G4730 Sleep apnea, unspecified: Secondary | ICD-10-CM | POA: Diagnosis not present

## 2023-01-24 DIAGNOSIS — Z6829 Body mass index (BMI) 29.0-29.9, adult: Secondary | ICD-10-CM

## 2023-01-24 DIAGNOSIS — Z79899 Other long term (current) drug therapy: Secondary | ICD-10-CM

## 2023-01-24 DIAGNOSIS — Z8616 Personal history of COVID-19: Secondary | ICD-10-CM

## 2023-01-24 DIAGNOSIS — H409 Unspecified glaucoma: Secondary | ICD-10-CM | POA: Diagnosis not present

## 2023-01-24 DIAGNOSIS — E039 Hypothyroidism, unspecified: Secondary | ICD-10-CM

## 2023-01-24 DIAGNOSIS — Z8701 Personal history of pneumonia (recurrent): Secondary | ICD-10-CM

## 2023-01-24 DIAGNOSIS — Z9181 History of falling: Secondary | ICD-10-CM

## 2023-01-24 DIAGNOSIS — I10 Essential (primary) hypertension: Secondary | ICD-10-CM | POA: Diagnosis not present

## 2023-01-24 DIAGNOSIS — E669 Obesity, unspecified: Secondary | ICD-10-CM

## 2023-01-24 DIAGNOSIS — Z90710 Acquired absence of both cervix and uterus: Secondary | ICD-10-CM

## 2023-01-24 DIAGNOSIS — M1711 Unilateral primary osteoarthritis, right knee: Secondary | ICD-10-CM | POA: Diagnosis not present

## 2023-01-24 DIAGNOSIS — N390 Urinary tract infection, site not specified: Secondary | ICD-10-CM | POA: Diagnosis not present

## 2023-01-24 DIAGNOSIS — I959 Hypotension, unspecified: Secondary | ICD-10-CM | POA: Diagnosis not present

## 2023-01-24 DIAGNOSIS — K7460 Unspecified cirrhosis of liver: Secondary | ICD-10-CM | POA: Diagnosis not present

## 2023-01-24 DIAGNOSIS — E0500 Thyrotoxicosis with diffuse goiter without thyrotoxic crisis or storm: Secondary | ICD-10-CM | POA: Diagnosis not present

## 2023-01-24 DIAGNOSIS — G4733 Obstructive sleep apnea (adult) (pediatric): Secondary | ICD-10-CM

## 2023-01-24 DIAGNOSIS — J410 Simple chronic bronchitis: Secondary | ICD-10-CM | POA: Diagnosis not present

## 2023-02-04 DIAGNOSIS — B0052 Herpesviral keratitis: Secondary | ICD-10-CM | POA: Diagnosis not present

## 2023-02-04 DIAGNOSIS — H35373 Puckering of macula, bilateral: Secondary | ICD-10-CM | POA: Diagnosis not present

## 2023-02-04 DIAGNOSIS — H16223 Keratoconjunctivitis sicca, not specified as Sjogren's, bilateral: Secondary | ICD-10-CM | POA: Diagnosis not present

## 2023-02-04 DIAGNOSIS — H168 Other keratitis: Secondary | ICD-10-CM | POA: Diagnosis not present

## 2023-02-08 ENCOUNTER — Ambulatory Visit
Admission: RE | Admit: 2023-02-08 | Discharge: 2023-02-08 | Disposition: A | Discharge status: Home / Self Care | Payer: Medicare Other | Source: Ambulatory Visit | Attending: Internal Medicine | Admitting: Internal Medicine

## 2023-02-08 DIAGNOSIS — R918 Other nonspecific abnormal finding of lung field: Secondary | ICD-10-CM | POA: Insufficient documentation

## 2023-02-19 ENCOUNTER — Ambulatory Visit: Payer: Medicare Other | Admitting: Internal Medicine

## 2023-03-04 ENCOUNTER — Encounter: Payer: Self-pay | Admitting: Internal Medicine

## 2023-03-04 ENCOUNTER — Ambulatory Visit (INDEPENDENT_AMBULATORY_CARE_PROVIDER_SITE_OTHER): Payer: Medicare Other | Admitting: Internal Medicine

## 2023-03-04 VITALS — BP 124/78 | HR 58 | Ht 62.0 in | Wt 158.4 lb

## 2023-03-04 DIAGNOSIS — Z8639 Personal history of other endocrine, nutritional and metabolic disease: Secondary | ICD-10-CM

## 2023-03-04 DIAGNOSIS — E0500 Thyrotoxicosis with diffuse goiter without thyrotoxic crisis or storm: Secondary | ICD-10-CM | POA: Diagnosis not present

## 2023-03-04 LAB — T3, FREE: T3, Free: 2.9 pg/mL (ref 2.3–4.2)

## 2023-03-04 LAB — TSH: TSH: 3.51 u[IU]/mL (ref 0.35–5.50)

## 2023-03-04 LAB — T4, FREE: Free T4: 1.04 ng/dL (ref 0.60–1.60)

## 2023-03-04 NOTE — Progress Notes (Signed)
Patient ID: Lauren Morris, female   DOB: 1932-01-17, 89 y.o.   MRN: [id number]   HPI  Lauren Morris is a 89 y.o.-year-old female, initially referred by her PCP, Dr. Darrick Huntsman, returning for follow-up for Graves' disease, with previous history of hypothyroidism.  She is here with her daughter Lauren Morris) who offers part of the history related to past medical history, symptoms, medications.  Last visit 1 year ago.  Interim history: She had cystitis in 09/2022, pneumonia in 10/2022 and bronchitis in 11/2022. She had an eye infection this past year and is on steroid drops. She has blurry vision. No weight loss, palpitations, tremors, anxiety, insomnia.  Reviewed history: Pt. has a history of hypothyroidism since at least 2012 >> she was on levothyroxine 25 mcg. However, in 02/2019, her TSH was found to be undetectably low and her levothyroxine dose was gradually reduced and finally stopped in 02/2019.  Despite this, her TSH remained low.   We started methimazole 5 mg daily in 05/2019, and then decreased to 2.5 mg daily.     Before last visit, due to concerns for transaminitis, and per GI recommendation, I advised her to stop methimazole before our visit in 02/2020. She did so.   She is on atenolol 25 mg twice a day.  Reviewed her TFTs: Lab Results  Component Value Date   TSH 5.46 02/16/2022   TSH 5.53 (H) 09/04/2021   TSH 5.55 (H) 05/25/2021   TSH 4.38 08/30/2020   TSH 2.74 03/01/2020   TSH 2.14 12/31/2019   TSH 6.10 (H) 10/22/2019   TSH <0.01 (L) 06/23/2019   TSH 0.01 (L) 05/28/2019   TSH 0.01 (L) 04/13/2019   FREET4 0.87 02/16/2022   FREET4 0.81 08/30/2020   FREET4 0.95 03/01/2020   FREET4 0.86 12/31/2019   FREET4 0.70 10/22/2019   FREET4 1.39 06/23/2019   T3FREE 2.3 02/16/2022   T3FREE 3.4 08/30/2020   T3FREE 3.3 03/01/2020   T3FREE 3.2 12/31/2019   T3FREE 2.9 10/22/2019   T3FREE 4.2 06/23/2019   Her TSI antibodies were elevated: 03/10/2019: TPO antibodies 5 (<9) Lab Results   Component Value Date   THGAB <1 03/10/2019   Lab Results  Component Value Date   TSI 167 (H) 06/23/2019   Thyroid uptake and scan (09/17/2019): Homogeneous tracer distribution in both thyroid lobes. No focal areas of increased or decreased tracer localization.   4 hour I-123 uptake = 15.8% (normal 5-20%) 24 hour I-123 uptake = 34.6% (normal 10-30%)   IMPRESSION: Normal thyroid scan. Elevated 24 hour radio iodine uptake consistent with hyperthyroidism. Overall findings are consistent with Graves disease.   Before starting methimazole, she experienced weight loss-approximately 20 pounds in 10 months, fatigue, shortness of breath with exertion, hair loss.  Pt denies: - feeling nodules in neck - hoarseness - dysphagia - choking  No FH of thyroid disease or thyroid cancer. No h/o radiation tx to head or neck. No Biotin use. No recent steroids use.   She has a history of OSA and uses a CPAP. She was taken off Amlodipine and started on Micardis 2/2 increased ACR.  ROS: + see HPI  I reviewed pt's medications, allergies, PMH, social hx, family hx, and changes were documented in the history of present illness. Otherwise, unchanged from my initial visit note.  Past Medical History:  Diagnosis Date   Cyst    hx o on right breast   DJD (degenerative joint disease)    right knee   Glaucoma  Hypertension    Pneumonia due to COVID-19 virus 12/12/2020   Sleep apnea    Thyroid disease    Past Surgical History:  Procedure Laterality Date   ABDOMINAL HYSTERECTOMY     VITRECTOMY  July 2012   right eye with membrane peel Lauren Morris, [place])   Social History   Socioeconomic History   Marital status: Widowed    Spouse name: Not on file   Number of children: 1   Years of education: Not on file   Highest education level: Not on file  Occupational History    Homemaker  Tobacco Use   Smoking status: Never Smoker   Smokeless tobacco: Never Used  Substance and Sexual Activity    Alcohol use: No   Drug use: No   Sexual activity: Not on file  Other Topics Concern   Not on file  Social History Narrative   Not on file   Social Determinants of Health   Financial Resource Strain:    Difficulty of Paying Living Expenses: Not on file  Food Insecurity:    Worried About Running Out of Food in the Last Year: Not on file   Ran Out of Food in the Last Year: Not on file  Transportation Needs:    Lack of Transportation (Medical): Not on file   Lack of Transportation (Non-Medical): Not on file  Physical Activity:    Days of Exercise per Week: Not on file   Minutes of Exercise per Session: Not on file  Stress:    Feeling of Stress : Not on file  Social Connections:    Frequency of Communication with Friends and Family: Not on file   Frequency of Social Gatherings with Friends and Family: Not on file   Attends Religious Services: Not on file   Active Member of Clubs or Organizations: Not on file   Attends Banker Meetings: Not on file   Marital Status: Not on file  Intimate Partner Violence:    Fear of Current or Ex-Partner: Not on file   Emotionally Abused: Not on file   Physically Abused: Not on file   Sexually Abused: Not on file   On:  Current Outpatient Medications on File Prior to Visit  Medication Sig Dispense Refill   albuterol (PROVENTIL) (2.5 MG/3ML) 0.083% nebulizer solution Take 3 mLs (2.5 mg total) by nebulization every 6 (six) hours as needed for wheezing or shortness of breath. 75 mL 12   Ascorbic Acid (VITAMIN C) 1000 MG tablet Take 1 tablet (1,000 mg total) by mouth daily. 90 tablet 3   Aspirin 81 MG CAPS Take 1 capsule by mouth daily at 6 (six) AM.     atenolol (TENORMIN) 25 MG tablet TAKE 1 TABLET BY MOUTH TWICE DAILY 180 tablet 1   Calcium Carb-Cholecalciferol (CALCIUM 1000 + D PO) Take 1 tablet by mouth daily.     dorzolamide-timolol (COSOPT) 22.3-6.8 MG/ML ophthalmic solution Place 1 drop into both eyes 2 (two) times daily.      famotidine (PEPCID) 20 MG tablet TAKE 1 TABLET BY MOUTH 2 TIMES DAILY 180 tablet 1   fish oil-omega-3 fatty acids 1000 MG capsule Take 2 capsules (2 g total) by mouth daily. 6 capsule 11   guaiFENesin (MUCINEX) 600 MG 12 hr tablet Take 1 tablet (600 mg total) by mouth 2 (two) times daily.     melatonin 5 MG TABS Take 1 tablet (5 mg total) by mouth at bedtime as needed. 30 tablet 0   Multiple Vitamin (MULTIVITAMIN  WITH MINERALS) TABS tablet Take 1 tablet by mouth daily. 30 tablet 0   RESTASIS 0.05 % ophthalmic emulsion Place 1 drop into both eyes 2 (two) times daily.     telmisartan (MICARDIS) 40 MG tablet TAKE 1 TABLET BY MOUTH AT BEDTIME 90 tablet 1   umeclidinium-vilanterol (ANORO ELLIPTA) 62.5-25 MCG/ACT AEPB Inhale 1 puff into the lungs daily. 30 each 5   valACYclovir (VALTREX) 500 MG tablet Take 500 mg by mouth at bedtime.     Zinc Oxide (TRIPLE PASTE) 12.8 % ointment Apply 1 Application topically. Every shift     zinc sulfate 220 (50 Zn) MG capsule Take 1 capsule (220 mg total) by mouth daily. 30 capsule 11   No current facility-administered medications on file prior to visit.   No Known Allergies Family History  Problem Relation Age of Onset   Cancer Father    Hypertension Father    Heart attack Mother    Hypertension Mother   Also, HTN mother and father, heart disease in mother.  PE: BP 124/78   Pulse (!) 58   Ht 5\' 2"  (1.575 m)   Wt 158 lb 6.4 oz (71.8 kg)   SpO2 96%   BMI 28.97 kg/m  Wt Readings from Last 3 Encounters:  11/26/22 154 lb 12.8 oz (70.2 kg)  11/09/22 156 lb 3.2 oz (70.9 kg)  11/08/22 155 lb 13.8 oz (70.7 kg)   Constitutional: overweight, in NAD Eyes: no exophthalmos ENT: no masses palpated in neck, no cervical lymphadenopathy Cardiovascular: RRR, No MRG Respiratory: CTA B Musculoskeletal: no deformities Skin: no rashes, + ecchymoses on forearms Neurological: no tremor with outstretched hands  ASSESSMENT: 1.  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 History of hypothyroidism  PLAN:  1.  Thyrotoxicosis due to Graves' disease -Patient with history of hypothyroidism, but with development of thyrotoxicosis in 02/2019, which persisted despite stopping levothyroxine.  She lost more than 20 pounds in the months before her Graves' disease was diagnosed.  She also developed tachycardia, shortness of breath with exertion, tremor.  At that time, TFTs were in the thyrotoxic range and TSI's were elevated.  We checked a thyroid uptake and scan in 08/2019 and this was consistent with Graves' disease.  We started methimazole 5 mg daily and then decreased the dose to 2.5 mg daily.  She did well on this, however, she had persistent transaminitis and her gastroenterologist, Dr. Kinnie Scales, recommended to stop methimazole.  We stopped this in 02/2020 -We did discuss about possible modalities for treatment for Graves' disease to include RAI treatment or surgery since we could not use thionamides, however, her TFTs are either within the target range or slightly abnormal with a TSH slightly higher than the upper limit of the target range, which is actually normal for age.  She did agree to have an RAI treatment if the tests were sent again. -At today's visit, she is asymptomatic and feels well. -She is still on atenolol 25 mg twice a day.  Her pulse is a little low today, in the 50s.  I advised her to discuss with Dr. Darrick Huntsman and see if the atenolol dose may need to be decreased. -No signs of active Graves' ophthalmopathy: No double vision, blurry vision, eye pain, chemosis -Will check her TFTs today -I will see her back in a year or possibly sooner for labs  2.  History of hypothyroidism -She was previously on a very low-dose of levothyroxine, 25 mcg daily but off since 10/2018 -Her TSH levels  are either at or slightly above the upper limit of the target range but free thyroid hormones are normal. -We discussed that slightly elevated TSH is normal for age and  I would not recommend treatment for this -At today's visit we will repeat her TFTs but reserve levothyroxine only if the TSH increases above 7-10  Component     Latest Ref Rng 03/04/2023  TSH     0.35 - 5.50 uIU/mL 3.51   T4,Free(Direct)     0.60 - 1.60 ng/dL 3.29   Triiodothyronine,Free,Serum     2.3 - 4.2 pg/mL 2.9   Normal TFTs.  Carlus Pavlov, MD PhD [facility]

## 2023-03-04 NOTE — Patient Instructions (Signed)
Please stop at the lab.  Please come back for a follow-up appointment in 1 year.  

## 2023-04-23 DIAGNOSIS — H1789 Other corneal scars and opacities: Secondary | ICD-10-CM | POA: Diagnosis not present

## 2023-04-23 DIAGNOSIS — B0052 Herpesviral keratitis: Secondary | ICD-10-CM | POA: Diagnosis not present

## 2023-04-23 DIAGNOSIS — H168 Other keratitis: Secondary | ICD-10-CM | POA: Diagnosis not present

## 2023-04-23 DIAGNOSIS — H16223 Keratoconjunctivitis sicca, not specified as Sjogren's, bilateral: Secondary | ICD-10-CM | POA: Diagnosis not present

## 2023-04-29 DIAGNOSIS — H168 Other keratitis: Secondary | ICD-10-CM | POA: Diagnosis not present

## 2023-04-29 DIAGNOSIS — H16223 Keratoconjunctivitis sicca, not specified as Sjogren's, bilateral: Secondary | ICD-10-CM | POA: Diagnosis not present

## 2023-04-29 DIAGNOSIS — B0052 Herpesviral keratitis: Secondary | ICD-10-CM | POA: Diagnosis not present

## 2023-05-06 DIAGNOSIS — H401131 Primary open-angle glaucoma, bilateral, mild stage: Secondary | ICD-10-CM | POA: Diagnosis not present

## 2023-05-06 DIAGNOSIS — B0052 Herpesviral keratitis: Secondary | ICD-10-CM | POA: Diagnosis not present

## 2023-05-06 DIAGNOSIS — H16223 Keratoconjunctivitis sicca, not specified as Sjogren's, bilateral: Secondary | ICD-10-CM | POA: Diagnosis not present

## 2023-05-06 DIAGNOSIS — H35373 Puckering of macula, bilateral: Secondary | ICD-10-CM | POA: Diagnosis not present

## 2023-05-11 ENCOUNTER — Other Ambulatory Visit: Payer: Self-pay | Admitting: Internal Medicine

## 2023-05-20 DIAGNOSIS — H16223 Keratoconjunctivitis sicca, not specified as Sjogren's, bilateral: Secondary | ICD-10-CM | POA: Diagnosis not present

## 2023-05-20 DIAGNOSIS — B0052 Herpesviral keratitis: Secondary | ICD-10-CM | POA: Diagnosis not present

## 2023-07-03 DIAGNOSIS — H35373 Puckering of macula, bilateral: Secondary | ICD-10-CM | POA: Diagnosis not present

## 2023-07-03 DIAGNOSIS — H168 Other keratitis: Secondary | ICD-10-CM | POA: Diagnosis not present

## 2023-07-03 DIAGNOSIS — H401131 Primary open-angle glaucoma, bilateral, mild stage: Secondary | ICD-10-CM | POA: Diagnosis not present

## 2023-07-03 DIAGNOSIS — H16223 Keratoconjunctivitis sicca, not specified as Sjogren's, bilateral: Secondary | ICD-10-CM | POA: Diagnosis not present

## 2023-07-15 ENCOUNTER — Other Ambulatory Visit: Payer: Self-pay | Admitting: Internal Medicine

## 2023-07-25 DIAGNOSIS — L249 Irritant contact dermatitis, unspecified cause: Secondary | ICD-10-CM | POA: Diagnosis not present

## 2023-09-02 ENCOUNTER — Encounter: Payer: Self-pay | Admitting: Internal Medicine

## 2023-09-02 ENCOUNTER — Ambulatory Visit (INDEPENDENT_AMBULATORY_CARE_PROVIDER_SITE_OTHER): Admitting: Internal Medicine

## 2023-09-02 VITALS — BP 118/80 | HR 60 | Temp 97.4°F

## 2023-09-02 DIAGNOSIS — R300 Dysuria: Secondary | ICD-10-CM

## 2023-09-02 DIAGNOSIS — J069 Acute upper respiratory infection, unspecified: Secondary | ICD-10-CM | POA: Diagnosis not present

## 2023-09-02 DIAGNOSIS — J3489 Other specified disorders of nose and nasal sinuses: Secondary | ICD-10-CM

## 2023-09-02 DIAGNOSIS — R52 Pain, unspecified: Secondary | ICD-10-CM

## 2023-09-02 DIAGNOSIS — N3000 Acute cystitis without hematuria: Secondary | ICD-10-CM

## 2023-09-02 LAB — POC URINALSYSI DIPSTICK (AUTOMATED)
Bilirubin, UA: NEGATIVE
Blood, UA: NEGATIVE
Glucose, UA: NEGATIVE
Ketones, UA: NEGATIVE
Leukocytes, UA: NEGATIVE
Nitrite, UA: POSITIVE
Protein, UA: NEGATIVE
Spec Grav, UA: 1.02 (ref 1.010–1.025)
Urobilinogen, UA: 1 U/dL
pH, UA: 5 (ref 5.0–8.0)

## 2023-09-02 LAB — POCT INFLUENZA A/B
Influenza A, POC: NEGATIVE
Influenza B, POC: NEGATIVE

## 2023-09-02 LAB — POC COVID19 BINAXNOW: SARS Coronavirus 2 Ag: NEGATIVE

## 2023-09-02 MED ORDER — CEPHALEXIN 500 MG PO CAPS: 500.0000 mg | ORAL_CAPSULE | Freq: Two times a day (BID) | ORAL | 0 refills | Status: DC

## 2023-09-02 NOTE — Patient Instructions (Addendum)
-  It was a pleasure meeting you today -I suspect you have likely have a viral upper respiratory tract infection that is causing your runny nose -Your flu and COVID tests are negative -I suspect you likely have a urinary tract infection and will prescribe Keflex for you (the last urinary tract infection you had was sensitive to this antibiotic) -We will follow-up urine culture to see what bacteria grows and make sure it is sensitive to this antibiotic -You can take Mucinex (not the D version) for the cough -I would also suggest saline nasal rinses and using over-the-counter Nettie pot to help with your nasal congestion -I would also advise warm showers with steam inhalation to help with your congestion -Please call us if your symptoms persist or worsen

## 2023-09-02 NOTE — Assessment & Plan Note (Signed)
-  Patient presents today with rhinorrhea, nasal congestion as well as associated cough with clear phlegm -On exam, she had mild right maxillary sinus tenderness.  Lungs are clear to auscultation bilaterally.  Her TMs appeared normal on ear exam.  She was noted to have some congestion and rhinorrhea on nasal exam -I suspect she likely has a viral URI -COVID and flu test were negative -Conservative measures were advised including warm showers with steam inhalation, saline nasal rinses as well as over-the-counter Nettie pot and Tylenol as needed -No further workup at this time

## 2023-09-02 NOTE — Assessment & Plan Note (Signed)
-  Patient complained of dysuria, increased urinary frequency and urgency over the last 2 days.  She does have a history of UTIs and was admitted last year for UTI with sepsis secondary to Klebsiella -Patient's urine dipstick showed positive nitrates but no pyuria.  This appears to be how she presented to the hospital last year as well -Her last urine culture grew Klebsiella which was sensitive to Keflex but resistant to nitrofurantoin -Will treat her current UTI with Keflex (5-day course) -Will follow-up urine culture

## 2023-09-02 NOTE — Progress Notes (Signed)
 Acute Office Visit  Subjective:     Patient ID: Lauren Morris, female    DOB: 03-04-1932, 89 y.o.   MRN: [id number]  No chief complaint on file.   HPI Patient is in today for dysuria as well as possible rhinorrhea.  Patient states that her symptoms began on Friday and she has had significant rhinorrhea.  She also complains of subjective fevers and chills and bodyaches.  Patient does state that her symptoms are slightly improved from Friday and Saturday.  She does complain of associated cough but states that this is not her main complaint.  Cough is productive of clear phlegm.  Patient also complains of increased urinary frequency and urgency as well as mild dysuria over the last 2 to 3 days.  Review of Systems  Constitutional:  Positive for chills, fever and malaise/fatigue.  HENT:  Positive for congestion and sinus pain. Negative for ear discharge, ear pain and sore throat.   Respiratory:  Positive for cough and sputum production. Negative for hemoptysis, shortness of breath and wheezing.   Cardiovascular: Negative.   Genitourinary:  Positive for dysuria, frequency and urgency. Negative for flank pain and hematuria.  Neurological: Negative.   Psychiatric/Behavioral: Negative.          Objective:    BP 118/80   Pulse 60   Temp (!) 97.4 F (36.3 C)   SpO2 99%    Physical Exam Constitutional:      Appearance: Normal appearance.  HENT:     Head: Normocephalic and atraumatic.     Right Ear: Tympanic membrane, ear canal and external ear normal.     Left Ear: Tympanic membrane, ear canal and external ear normal.     Nose: Congestion and rhinorrhea present.     Right Sinus: Maxillary sinus tenderness present. No frontal sinus tenderness.     Left Sinus: No maxillary sinus tenderness or frontal sinus tenderness.     Mouth/Throat:     Mouth: Mucous membranes are moist.     Pharynx: Oropharynx is clear. No oropharyngeal exudate or posterior oropharyngeal erythema.   Cardiovascular:     Rate and Rhythm: Normal rate and regular rhythm.  Pulmonary:     Effort: Pulmonary effort is normal.     Breath sounds: Normal breath sounds. No wheezing, rhonchi or rales.  Abdominal:     General: Bowel sounds are normal. There is no distension.     Palpations: Abdomen is soft.     Tenderness: There is no abdominal tenderness. There is no guarding or rebound.  Neurological:     Mental Status: She is alert.  Psychiatric:        Mood and Affect: Mood normal.        Behavior: Behavior normal.     Results for orders placed or performed in visit on 09/02/23  POC COVID-19 BinaxNow  Result Value Ref Range   SARS Coronavirus 2 Ag Negative Negative  POCT Influenza A/B  Result Value Ref Range   Influenza A, POC Negative Negative   Influenza B, POC Negative Negative  POCT Urinalysis Dipstick (Automated)  Result Value Ref Range   Color, UA yellow    Clarity, UA clear    Glucose, UA Negative Negative   Bilirubin, UA neg    Ketones, UA neg    Spec Grav, UA 1.020 1.010 - 1.025   Blood, UA neg    pH, UA 5.0 5.0 - 8.0   Protein, UA Negative Negative   Urobilinogen, UA 1.0 0.2  or 1.0 E.U./dL   Nitrite, UA positive    Leukocytes, UA Negative Negative        Assessment & Plan:   Problem List Items Addressed This Visit       Respiratory   Viral URI   -Patient presents today with rhinorrhea, nasal congestion as well as associated cough with clear phlegm -On exam, she had mild right maxillary sinus tenderness.  Lungs are clear to auscultation bilaterally.  Her TMs appeared normal on ear exam.  She was noted to have some congestion and rhinorrhea on nasal exam -I suspect she likely has a viral URI -COVID and flu test were negative -Conservative measures were advised including warm showers with steam inhalation, saline nasal rinses as well as over-the-counter Nettie pot and Tylenol as needed -No further workup at this time      Relevant Medications    cephALEXin (KEFLEX) 500 MG capsule     Genitourinary   UTI (urinary tract infection)   -Patient complained of dysuria, increased urinary frequency and urgency over the last 2 days.  She does have a history of UTIs and was admitted last year for UTI with sepsis secondary to Klebsiella -Patient's urine dipstick showed positive nitrates but no pyuria.  This appears to be how she presented to the hospital last year as well -Her last urine culture grew Klebsiella which was sensitive to Keflex but resistant to nitrofurantoin -Will treat her current UTI with Keflex (5-day course) -Will follow-up urine culture      Relevant Medications   cephALEXin (KEFLEX) 500 MG capsule   Other Visit Diagnoses       Stuffy and runny nose    -  Primary   Relevant Orders   POC COVID-19 BinaxNow (Completed)   POCT Influenza A/B (Completed)   POCT Urinalysis Dipstick (Automated) (Completed)     Body aches       Relevant Orders   POC COVID-19 BinaxNow (Completed)   POCT Influenza A/B (Completed)   POCT Urinalysis Dipstick (Automated) (Completed)     Dysuria       Relevant Orders   Urine Culture       Meds ordered this encounter  Medications   cephALEXin (KEFLEX) 500 MG capsule    Sig: Take 1 capsule (500 mg total) by mouth 2 (two) times daily.    Dispense:  10 capsule    Refill:  0    No follow-ups on file.  Earl Lagos, MD

## 2023-09-05 ENCOUNTER — Other Ambulatory Visit: Payer: Self-pay | Admitting: Internal Medicine

## 2023-09-05 DIAGNOSIS — N3000 Acute cystitis without hematuria: Secondary | ICD-10-CM

## 2023-09-05 LAB — URINE CULTURE
MICRO NUMBER:: 16180650
SPECIMEN QUALITY:: ADEQUATE

## 2023-09-05 MED ORDER — AMOXICILLIN-POT CLAVULANATE 875-125 MG PO TABS: 1.0000 | ORAL_TABLET | Freq: Two times a day (BID) | ORAL | 0 refills | Status: DC

## 2023-09-05 NOTE — Assessment & Plan Note (Signed)
-  Patient's urine culture grew Klebsiella.  Sensitivity for cefazolin not reported -Our office called the patient and patient noted that she is still experiencing urinary frequency and urgency despite 2 days of antibiotics -Will transition patient to Augmentin to treat this UTI -Will avoid Bactrim given her decreased GFR and being on telmisartan (increased risk for hyperkalemia in this setting) -No further workup at this time

## 2023-09-09 ENCOUNTER — Telehealth: Payer: Self-pay

## 2023-09-09 NOTE — Telephone Encounter (Signed)
 Copied from CRM [id number]. Topic: General - Other >> Sep 09, 2023 12:48 PM Rodman Pickle T wrote: Reason for CRM: patient daughter inlaw is calling in stating patient does not want the wellness exam but would like to change her appt to dr visit with dr Darrick Huntsman to be checked for a uti

## 2023-09-10 ENCOUNTER — Ambulatory Visit (INDEPENDENT_AMBULATORY_CARE_PROVIDER_SITE_OTHER): Admitting: Internal Medicine

## 2023-09-10 ENCOUNTER — Encounter: Payer: Self-pay | Admitting: Internal Medicine

## 2023-09-10 VITALS — BP 138/74 | HR 65 | Ht 62.0 in | Wt 159.2 lb

## 2023-09-10 DIAGNOSIS — E1169 Type 2 diabetes mellitus with other specified complication: Secondary | ICD-10-CM

## 2023-09-10 DIAGNOSIS — J0100 Acute maxillary sinusitis, unspecified: Secondary | ICD-10-CM | POA: Diagnosis not present

## 2023-09-10 DIAGNOSIS — E039 Hypothyroidism, unspecified: Secondary | ICD-10-CM | POA: Diagnosis not present

## 2023-09-10 DIAGNOSIS — B9689 Other specified bacterial agents as the cause of diseases classified elsewhere: Secondary | ICD-10-CM

## 2023-09-10 DIAGNOSIS — E1159 Type 2 diabetes mellitus with other circulatory complications: Secondary | ICD-10-CM | POA: Diagnosis not present

## 2023-09-10 DIAGNOSIS — I152 Hypertension secondary to endocrine disorders: Secondary | ICD-10-CM

## 2023-09-10 DIAGNOSIS — N390 Urinary tract infection, site not specified: Secondary | ICD-10-CM | POA: Diagnosis not present

## 2023-09-10 DIAGNOSIS — Z66 Do not resuscitate: Secondary | ICD-10-CM

## 2023-09-10 DIAGNOSIS — E0500 Thyrotoxicosis with diffuse goiter without thyrotoxic crisis or storm: Secondary | ICD-10-CM | POA: Diagnosis not present

## 2023-09-10 DIAGNOSIS — J069 Acute upper respiratory infection, unspecified: Secondary | ICD-10-CM

## 2023-09-10 DIAGNOSIS — E785 Hyperlipidemia, unspecified: Secondary | ICD-10-CM | POA: Diagnosis not present

## 2023-09-10 DIAGNOSIS — Z7189 Other specified counseling: Secondary | ICD-10-CM

## 2023-09-10 DIAGNOSIS — R300 Dysuria: Secondary | ICD-10-CM

## 2023-09-10 DIAGNOSIS — E669 Obesity, unspecified: Secondary | ICD-10-CM

## 2023-09-10 LAB — COMPREHENSIVE METABOLIC PANEL
ALT: 62 U/L — ABNORMAL HIGH (ref 0–35)
AST: 79 U/L — ABNORMAL HIGH (ref 0–37)
Albumin: 3.6 g/dL (ref 3.5–5.2)
Alkaline Phosphatase: 451 U/L — ABNORMAL HIGH (ref 39–117)
BUN: 13 mg/dL (ref 6–23)
CO2: 26 meq/L (ref 19–32)
Calcium: 9.7 mg/dL (ref 8.4–10.5)
Chloride: 101 meq/L (ref 96–112)
Creatinine, Ser: 0.77 mg/dL (ref 0.40–1.20)
GFR: 68.48 mL/min (ref >60.00)
Glucose, Bld: 104 mg/dL — ABNORMAL HIGH (ref 70–99)
Potassium: 4.4 meq/L (ref 3.5–5.1)
Sodium: 136 meq/L (ref 135–145)
Total Bilirubin: 1.4 mg/dL — ABNORMAL HIGH (ref 0.2–1.2)
Total Protein: 7.5 g/dL (ref 6.0–8.3)

## 2023-09-10 LAB — POCT URINALYSIS DIPSTICK
Bilirubin, UA: NEGATIVE
Blood, UA: NEGATIVE
Glucose, UA: NEGATIVE
Ketones, UA: NEGATIVE
Leukocytes, UA: NEGATIVE
Nitrite, UA: NEGATIVE
Protein, UA: NEGATIVE
Spec Grav, UA: 1.015 (ref 1.010–1.025)
Urobilinogen, UA: 0.2 U/dL
pH, UA: 5.5 (ref 5.0–8.0)

## 2023-09-10 LAB — LIPID PANEL
Cholesterol: 183 mg/dL (ref 0–200)
HDL: 49.4 mg/dL (ref >39.00)
LDL Cholesterol: 113 mg/dL — ABNORMAL HIGH (ref 0–99)
NonHDL: 133.9
Total CHOL/HDL Ratio: 4
Triglycerides: 104 mg/dL (ref 0.0–149.0)
VLDL: 20.8 mg/dL (ref 0.0–40.0)

## 2023-09-10 LAB — HEMOGLOBIN A1C: Hgb A1c MFr Bld: 5.9 % (ref 4.6–6.5)

## 2023-09-10 LAB — URINALYSIS, MICROSCOPIC ONLY: RBC / HPF: NONE SEEN (ref 0–?)

## 2023-09-10 LAB — MICROALBUMIN / CREATININE URINE RATIO
Creatinine,U: 40.8 mg/dL
Microalb Creat Ratio: 33.7 mg/g — ABNORMAL HIGH (ref 0.0–30.0)
Microalb, Ur: 1.4 mg/dL (ref 0.0–1.9)

## 2023-09-10 LAB — LDL CHOLESTEROL, DIRECT: Direct LDL: 105 mg/dL

## 2023-09-10 MED ORDER — TELMISARTAN 40 MG PO TABS: 40.0000 mg | ORAL_TABLET | Freq: Every day | ORAL | 1 refills | Status: DC

## 2023-09-10 MED ORDER — FAMOTIDINE 20 MG PO TABS: 20.0000 mg | ORAL_TABLET | Freq: Two times a day (BID) | ORAL | 1 refills | Status: DC

## 2023-09-10 MED ORDER — ATENOLOL 25 MG PO TABS: 25.0000 mg | ORAL_TABLET | Freq: Two times a day (BID) | ORAL | 1 refills | Status: DC

## 2023-09-10 NOTE — Assessment & Plan Note (Signed)
-  COVID and flu test were negative. She was prescribed augmentin for sinus congestion /sinusitis

## 2023-09-10 NOTE — Progress Notes (Signed)
 Subjective:  Patient ID: Lauren Morris, female    DOB: 12/05/1931  Age: 89 y.o. MRN: [id number]  CC: The primary encounter diagnosis was Obesity, diabetes, and hypertension syndrome (HCC). Diagnoses of Hyperlipidemia LDL goal <100, Dysuria, DNR (do not resuscitate) discussion, Do not resuscitate status, Acquired hypothyroidism, Graves disease, UTI due to Klebsiella species, Acute non-recurrent maxillary sinusitis, and Viral URI were also pertinent to this visit.   HPI Lauren Morris presents for  Chief Complaint  Patient presents with   Dysuria   1) follow up on UTI : still taking augmentin prescribed by Dr Velora Mediate (antibiotic was changed from keflex to augmentin for concurrent sinusitis )  symptoms started with frequency 10 days ago.    2) SINUSITIS; RIGHT MAXILLARY TENDERNESS ACC'D BY YELLOW DISCAHRGE TAKING AUGMENITN SINCE MARCH 13/14  3) t2dm with hypertension :  REVIEWED LAST UACR done in April 2024  CORRECTS TO 13   TAKING TELMISARTAN .  HAS BASELINE URGE INCONTINENCE  4 DRY SKIN      Outpatient Medications Prior to Visit  Medication Sig Dispense Refill   albuterol (PROVENTIL) (2.5 MG/3ML) 0.083% nebulizer solution Take 3 mLs (2.5 mg total) by nebulization every 6 (six) hours as needed for wheezing or shortness of breath. 75 mL 12   amoxicillin-clavulanate (AUGMENTIN) 875-125 MG tablet Take 1 tablet by mouth 2 (two) times daily. 10 tablet 0   Ascorbic Acid (VITAMIN C) 1000 MG tablet Take 1 tablet (1,000 mg total) by mouth daily. 90 tablet 3   Aspirin 81 MG CAPS Take 1 capsule by mouth daily at 6 (six) AM.     Calcium Carb-Cholecalciferol (CALCIUM 1000 + D PO) Take 1 tablet by mouth daily.     Difluprednate 0.05 % EMUL Place 1 drop into the left eye 2 (two) times daily.     dorzolamide-timolol (COSOPT) 22.3-6.8 MG/ML ophthalmic solution Place 1 drop into both eyes 2 (two) times daily.     fish oil-omega-3 fatty acids 1000 MG capsule Take 2 capsules (2 g total) by mouth daily. 6  capsule 11   guaiFENesin (MUCINEX) 600 MG 12 hr tablet Take 1 tablet (600 mg total) by mouth 2 (two) times daily.     Multiple Vitamin (MULTIVITAMIN WITH MINERALS) TABS tablet Take 1 tablet by mouth daily. 30 tablet 0   RESTASIS 0.05 % ophthalmic emulsion Place 1 drop into both eyes 2 (two) times daily.     timolol (TIMOPTIC) 0.5 % ophthalmic solution SMARTSIG:In Eye(s)     umeclidinium-vilanterol (ANORO ELLIPTA) 62.5-25 MCG/ACT AEPB Inhale 1 puff into the lungs daily. 30 each 5   Zinc Oxide (TRIPLE PASTE) 12.8 % ointment Apply 1 Application topically. Every shift     zinc sulfate 220 (50 Zn) MG capsule Take 1 capsule (220 mg total) by mouth daily. 30 capsule 11   atenolol (TENORMIN) 25 MG tablet TAKE ONE TABLET BY MOUTH TWICE DAILY 180 tablet 0   famotidine (PEPCID) 20 MG tablet TAKE 1 TABLET BY MOUTH 2 TIMES DAILY 180 tablet 0   telmisartan (MICARDIS) 40 MG tablet TAKE 1 TABLET BY MOUTH AT BEDTIME 90 tablet 1   melatonin 5 MG TABS Take 1 tablet (5 mg total) by mouth at bedtime as needed. (Patient not taking: Reported on 09/10/2023) 30 tablet 0   valACYclovir (VALTREX) 500 MG tablet Take 500 mg by mouth at bedtime. (Patient not taking: Reported on 09/10/2023)     No facility-administered medications prior to visit.    Review of Systems;  Patient  denies headache, fevers, malaise, unintentional weight loss, skin rash, eye pain, sinus congestion and sinus pain, sore throat, dysphagia,  hemoptysis , cough, dyspnea, wheezing, chest pain, palpitations, orthopnea, edema, abdominal pain, nausea, melena, diarrhea, constipation, flank pain, dysuria, hematuria,  numbness, tingling, seizures,  Focal weakness, Loss of consciousness,  Tremor, insomnia, depression, anxiety, and suicidal ideation.      Objective:  BP 138/74   Pulse 65   Ht 5\' 2"  (1.575 m)   Wt 159 lb 3.2 oz (72.2 kg)   SpO2 95%   BMI 29.12 kg/m   BP Readings from Last 3 Encounters:  09/10/23 138/74  09/02/23 118/80  03/04/23 124/78     Wt Readings from Last 3 Encounters:  09/10/23 159 lb 3.2 oz (72.2 kg)  03/04/23 158 lb 6.4 oz (71.8 kg)  11/26/22 154 lb 12.8 oz (70.2 kg)    Physical Exam Vitals reviewed.  Constitutional:      General: She is not in acute distress.    Appearance: Normal appearance. She is normal weight. She is not ill-appearing, toxic-appearing or diaphoretic.  HENT:     Head: Normocephalic.      Comments: Right maxillary tenderness to palpitation  Eyes:     General: No scleral icterus.       Right eye: No discharge.        Left eye: No discharge.     Conjunctiva/sclera: Conjunctivae normal.  Cardiovascular:     Rate and Rhythm: Normal rate and regular rhythm.     Heart sounds: Normal heart sounds.  Pulmonary:     Effort: Pulmonary effort is normal. No respiratory distress.     Breath sounds: Normal breath sounds.  Musculoskeletal:        General: Normal range of motion.  Skin:    General: Skin is warm and dry.  Neurological:     General: No focal deficit present.     Mental Status: She is alert and oriented to person, place, and time. Mental status is at baseline.  Psychiatric:        Mood and Affect: Mood normal.        Behavior: Behavior normal.        Thought Content: Thought content normal.        Judgment: Judgment normal.    Lab Results  Component Value Date   HGBA1C 5.9 09/10/2023   HGBA1C 6.0 05/07/2022   HGBA1C 6.1 09/04/2021    Lab Results  Component Value Date   CREATININE 0.77 09/10/2023   CREATININE 0.91 11/08/2022   CREATININE 0.71 10/11/2022    Lab Results  Component Value Date   WBC 7.9 11/08/2022   HGB 14.2 11/08/2022   HCT 43.4 11/08/2022   PLT 169 11/08/2022   GLUCOSE 104 (H) 09/10/2023   CHOL 183 09/10/2023   TRIG 104.0 09/10/2023   HDL 49.40 09/10/2023   LDLDIRECT 105.0 09/10/2023   LDLCALC 113 (H) 09/10/2023   ALT 62 (H) 09/10/2023   AST 79 (H) 09/10/2023   NA 136 09/10/2023   K 4.4 09/10/2023   CL 101 09/10/2023   CREATININE 0.77  09/10/2023   BUN 13 09/10/2023   CO2 26 09/10/2023   TSH 3.51 03/04/2023   INR 1.2 10/10/2022   HGBA1C 5.9 09/10/2023   MICROALBUR 1.4 09/10/2023    CT Chest Wo Contrast Result Date: 02/17/2023 CLINICAL DATA:  89 year old female history of pneumonia. Complications suspected. EXAM: CT CHEST WITHOUT CONTRAST TECHNIQUE: Multidetector CT imaging of the chest was performed following the standard  protocol without IV contrast. RADIATION DOSE REDUCTION: This exam was performed according to the departmental dose-optimization program which includes automated exposure control, adjustment of the mA and/or kV according to patient size and/or use of iterative reconstruction technique. COMPARISON:  Chest CT 11/08/2022. FINDINGS: Comment: Today's study is slightly limited by patient respiratory motion. Cardiovascular: Heart size is mildly enlarged. There is no significant pericardial fluid, thickening or pericardial calcification. There is aortic atherosclerosis, as well as atherosclerosis of the great vessels of the mediastinum and the coronary arteries, including calcified atherosclerotic plaque in the left main, left anterior descending, left circumflex and right coronary arteries. Mild calcifications of the aortic valve. Moderate calcifications of the mitral annulus. Mediastinum/Nodes: No pathologically enlarged mediastinal or hilar lymph nodes. Please note that accurate exclusion of hilar adenopathy is limited on noncontrast CT scans. Esophagus is unremarkable in appearance. No axillary lymphadenopathy. Lungs/Pleura: There is a mosaic attenuation throughout the lungs bilaterally, suggesting air trapping from small airways disease. No confluent consolidative airspace disease. Trace right pleural effusion. No left pleural effusion. Mild chronic scarring in the lung bases bilaterally, similar to the prior study. No definite suspicious appearing pulmonary nodules or masses are noted on today's motion limited examination.  Upper Abdomen: Aortic atherosclerosis. Liver has a shrunken appearance and nodular contour, indicative of underlying cirrhosis. Musculoskeletal: There are no aggressive appearing lytic or blastic lesions noted in the visualized portions of the skeleton. IMPRESSION: 1. No findings to suggest residual pneumonia. 2. Widespread air trapping indicative of small airways disease. 3. Cardiomegaly. 4. Aortic atherosclerosis, in addition to left main and three-vessel coronary artery disease. 5. There are calcifications of the aortic valve and mitral annulus. Echocardiographic correlation for evaluation of potential valvular dysfunction may be warranted if clinically indicated. 6. Cirrhosis. Aortic Atherosclerosis (ICD10-I70.0). Electronically Signed   By: Trudie Reed M.D.   On: 02/17/2023 13:36    Assessment & Plan:  .Obesity, diabetes, and hypertension syndrome (HCC) Assessment & Plan:  Historically well-controlled on diet alone .  hemoglobin A1c has been consistently at or  less than 7.0 . Patient is up-to-date on eye exams and foot exam is normal today. Patient has no microalbuminuria., but  UaCR is >30 Patient has declined  statin therapy for CAD risk reduction but taking telmisartan for renal protection and hypertension    Lab Results  Component Value Date   HGBA1C 5.9 09/10/2023   Lab Results  Component Value Date   MICROALBUR 1.4 09/10/2023   MICROALBUR 1.6 05/07/2022        Orders: -     Microalbumin / creatinine urine ratio -     Hemoglobin A1c -     Comprehensive metabolic panel  Hyperlipidemia LDL goal <100 -     Lipid panel -     LDL cholesterol, direct  Dysuria -     POCT urinalysis dipstick -     Urine Culture -     Urine Microscopic  DNR (do not resuscitate) discussion -     Do not attempt resuscitation (DNR)  Do not resuscitate status Assessment & Plan: Reviewed /confirmed with patient today.     Acquired hypothyroidism Assessment & Plan: Reviewed Dr Lavella Hammock  noted form September.  Will Obtain total thyroid profile.  Can consider  increasing her dose of levothyroxine if TSH is > 7  Orders: -     Thyroid Panel With TSH  Graves disease -     Thyroid Panel With TSH  UTI due to Klebsiella species Assessment & Plan: Primary  symptom was frequency,  not dysuria.  She is on day 6 of augmentin.  Repeat culture pending given persistent symptoms    Acute non-recurrent maxillary sinusitis Assessment & Plan: She remains tender over the right maxillary sinus and drainage from right side is yellow  no fevers of blood drainage.  Advised to add zyrtec for pollen    Viral URI Assessment & Plan: -COVID and flu test were negative. She was prescribed augmentin for sinus congestion /sinusitis     Other orders -     Atenolol; Take 1 tablet (25 mg total) by mouth 2 (two) times daily.  Dispense: 180 tablet; Refill: 1 -     Famotidine; Take 1 tablet (20 mg total) by mouth 2 (two) times daily.  Dispense: 180 tablet; Refill: 1 -     Telmisartan; Take 1 tablet (40 mg total) by mouth at bedtime.  Dispense: 90 tablet; Refill: 1     I spent 34 minutes on the day of this face to face encounter reviewing patient's  most recent visit with cardiology,  nephrology,  and neurology,  prior relevant surgical and non surgical procedures, recent  labs and imaging studies, counseling on weight management,  reviewing the assessment and plan with patient, and post visit ordering and reviewing of  diagnostics and therapeutics with patient  .   Follow-up: Return in about 6 months (around 03/12/2024).   Sherlene Shams, MD

## 2023-09-10 NOTE — Assessment & Plan Note (Signed)
 She remains tender over the right maxillary sinus and drainage from right side is yellow  no fevers of blood drainage.  Advised to add zyrtec for pollen

## 2023-09-10 NOTE — Assessment & Plan Note (Addendum)
 Primary symptom was frequency,  not dysuria.  She is on day 6 of augmentin.  Repeat culture pending given persistent symptoms

## 2023-09-10 NOTE — Assessment & Plan Note (Signed)
 REMAINS well-controlled on diet alone .  hemoglobin A1c has been consistently at or  less than 7.0 . Patient is up-to-date on eye exams and foot exam is normal today. Patient has  INCREASING microalbuminuria., and   UaCR is  increasing.  Advised to increase telmisartan to 80 mg daily.   Patient has declined  statin therapy for CAD risk reduction Lab Results  Component Value Date   HGBA1C 5.9 09/10/2023   Lab Results  Component Value Date   MICROALBUR 1.4 09/10/2023   MICROALBUR 1.6 05/07/2022

## 2023-09-10 NOTE — Assessment & Plan Note (Signed)
 Reviewed /confirmed with patient today.

## 2023-09-10 NOTE — Patient Instructions (Addendum)
 Finish the Augmentin  . And resume zyrtec at night for allergies.    Repeat urine is pending and will take 2 days to complete  By now you have  received the message from [facility] about a miscalculation on the test that measures the albumin to creatinine ratio which helps [place] determine if early kidney disease is present. I have reviewed your most recent screening test for nephropathy, which  is done annually and will be repeated today.  Your last one was NORMAL

## 2023-09-10 NOTE — Telephone Encounter (Signed)
 Appt has been changed to an appt with Dr. Darrick Huntsman.

## 2023-09-10 NOTE — Assessment & Plan Note (Signed)
 Reviewed Dr Lavella Hammock noted form September.  Will Obtain total thyroid profile.  Can consider  increasing her dose of levothyroxine if TSH is > 7

## 2023-09-11 ENCOUNTER — Ambulatory Visit: Payer: Medicare Other

## 2023-09-11 LAB — URINE CULTURE
MICRO NUMBER:: 16215337
Result:: NO GROWTH
SPECIMEN QUALITY:: ADEQUATE

## 2023-09-11 LAB — THYROID PANEL WITH TSH
Free Thyroxine Index: 2.4 (ref 1.4–3.8)
T3 Uptake: 28 % (ref 22–35)
T4, Total: 8.4 ug/dL (ref 5.1–11.9)
TSH: 3.05 m[IU]/L (ref 0.40–4.50)

## 2023-09-12 ENCOUNTER — Other Ambulatory Visit: Payer: Self-pay | Admitting: Internal Medicine

## 2023-09-12 ENCOUNTER — Encounter: Payer: Self-pay | Admitting: Internal Medicine

## 2023-09-12 MED ORDER — TELMISARTAN 80 MG PO TABS: 80.0000 mg | ORAL_TABLET | Freq: Every day | ORAL | 1 refills | Status: DC

## 2023-09-12 NOTE — Addendum Note (Signed)
 Addended by: Sherlene Shams on: 09/12/2023 07:11 AM   Modules accepted: Orders

## 2023-09-13 ENCOUNTER — Encounter: Payer: Self-pay | Admitting: Internal Medicine

## 2023-09-13 ENCOUNTER — Ambulatory Visit: Payer: Self-pay

## 2023-09-13 NOTE — Telephone Encounter (Signed)
  Chief Complaint: Information only  Disposition: [] ED /[] Urgent Care (no appt availability in office) / [] Appointment(In office/virtual)/ []  [facility]/ [x] Home Care/ [] Refused Recommended Disposition /[] [facility]/ []  Follow-up with PCP Additional Notes: Patient's daughter, [place] S[place] on [place], was calling for clarification on secure message from 09/12/23. Information relayed to caller. Per protocol, home care appropriate. Care advice reviewed, caller verbalized understanding and denies further questions at this time. Advised to monitor for worsening signs and symptoms and call back with any changes. Alerting PCP for review.    Reason for Disposition  Health Information question, no triage required and triager able to answer question  Answer Assessment - Initial Assessment Questions 1. REASON FOR CALL or QUESTION: "What is your reason for calling today?" or "How can I best help you?" or "What question do you have that I can help answer?"     Clarification on mychart message  Protocols used: Information Only Call - No Triage-A-AH

## 2023-09-13 NOTE — Telephone Encounter (Signed)
 Pt's daughter called and stated that pt is still having UTI symptoms despite having just finished an antibiotic. Called pt to see what symptoms she was still having and she stated that its just urinary frequency and it does seem a little bit better than yesterday. Daughter would like to know if pt needs any more antibiotics.

## 2023-09-13 NOTE — Telephone Encounter (Signed)
 Copied from CRM [id number]. Topic: Clinical - Medical Advice >> Sep 13, 2023 12:54 PM Isabell A wrote: Reason for CRM: Daughter [place] Sink calling in regards to patient still having UTI symptoms, wants to know if more medication will be called in.  Callback number: [phone]   Chief Complaint: Urinary Symptoms on Antibiotic Follow Up    Additional Notes: This triage RN attempted to contact the patient, no answer at this time. Left a voicemail.

## 2023-09-13 NOTE — Telephone Encounter (Signed)
 Copied from CRM [id number] [id number]. Topic: Clinical - Medical Advice >> Sep 13, 2023 12:54 PM Isabell A wrote: Reason for CRM: Daughter [place] Sink calling in regards to patient still having UTI symptoms, wants to know if more medication will be called in.  Callback number: [phone] >> Sep 13, 2023  1:27 PM Dimitri Ped wrote: Patient daughter called back cause she received a call from nurse triage  concerning patient . Got patient transferred over to nurse triage to speak with Morrie Sheldon

## 2023-09-24 DIAGNOSIS — H35373 Puckering of macula, bilateral: Secondary | ICD-10-CM | POA: Diagnosis not present

## 2023-09-24 DIAGNOSIS — H401131 Primary open-angle glaucoma, bilateral, mild stage: Secondary | ICD-10-CM | POA: Diagnosis not present

## 2023-09-24 DIAGNOSIS — H3581 Retinal edema: Secondary | ICD-10-CM | POA: Diagnosis not present

## 2023-09-24 DIAGNOSIS — B0052 Herpesviral keratitis: Secondary | ICD-10-CM | POA: Diagnosis not present

## 2023-10-14 ENCOUNTER — Other Ambulatory Visit: Payer: Self-pay

## 2023-10-14 ENCOUNTER — Emergency Department

## 2023-10-14 ENCOUNTER — Inpatient Hospital Stay
Admission: EM | Admit: 2023-10-14 | Discharge: 2023-10-22 | DRG: 871 | Disposition: A | Discharge status: Skilled Nursing Facility | Attending: Student | Admitting: Student

## 2023-10-14 DIAGNOSIS — J410 Simple chronic bronchitis: Secondary | ICD-10-CM

## 2023-10-14 DIAGNOSIS — R0989 Other specified symptoms and signs involving the circulatory and respiratory systems: Secondary | ICD-10-CM | POA: Diagnosis not present

## 2023-10-14 DIAGNOSIS — R6520 Severe sepsis without septic shock: Principal | ICD-10-CM

## 2023-10-14 DIAGNOSIS — R4182 Altered mental status, unspecified: Secondary | ICD-10-CM | POA: Diagnosis not present

## 2023-10-14 DIAGNOSIS — R6521 Severe sepsis with septic shock: Secondary | ICD-10-CM | POA: Diagnosis present

## 2023-10-14 DIAGNOSIS — G8194 Hemiplegia, unspecified affecting left nondominant side: Secondary | ICD-10-CM | POA: Diagnosis present

## 2023-10-14 DIAGNOSIS — T361X5A Adverse effect of cephalosporins and other beta-lactam antibiotics, initial encounter: Secondary | ICD-10-CM | POA: Diagnosis not present

## 2023-10-14 DIAGNOSIS — J9811 Atelectasis: Secondary | ICD-10-CM | POA: Diagnosis not present

## 2023-10-14 DIAGNOSIS — I69354 Hemiplegia and hemiparesis following cerebral infarction affecting left non-dominant side: Secondary | ICD-10-CM | POA: Diagnosis not present

## 2023-10-14 DIAGNOSIS — Z66 Do not resuscitate: Secondary | ICD-10-CM | POA: Diagnosis present

## 2023-10-14 DIAGNOSIS — J90 Pleural effusion, not elsewhere classified: Secondary | ICD-10-CM | POA: Diagnosis present

## 2023-10-14 DIAGNOSIS — N179 Acute kidney failure, unspecified: Secondary | ICD-10-CM | POA: Diagnosis not present

## 2023-10-14 DIAGNOSIS — I619 Nontraumatic intracerebral hemorrhage, unspecified: Secondary | ICD-10-CM | POA: Diagnosis not present

## 2023-10-14 DIAGNOSIS — K7460 Unspecified cirrhosis of liver: Secondary | ICD-10-CM | POA: Diagnosis not present

## 2023-10-14 DIAGNOSIS — E876 Hypokalemia: Secondary | ICD-10-CM | POA: Diagnosis present

## 2023-10-14 DIAGNOSIS — I700 Atherosclerosis of aorta: Secondary | ICD-10-CM | POA: Diagnosis not present

## 2023-10-14 DIAGNOSIS — R531 Weakness: Secondary | ICD-10-CM | POA: Diagnosis not present

## 2023-10-14 DIAGNOSIS — E785 Hyperlipidemia, unspecified: Secondary | ICD-10-CM | POA: Diagnosis present

## 2023-10-14 DIAGNOSIS — N3000 Acute cystitis without hematuria: Secondary | ICD-10-CM | POA: Diagnosis not present

## 2023-10-14 DIAGNOSIS — I10 Essential (primary) hypertension: Secondary | ICD-10-CM | POA: Diagnosis not present

## 2023-10-14 DIAGNOSIS — I110 Hypertensive heart disease with heart failure: Secondary | ICD-10-CM | POA: Diagnosis present

## 2023-10-14 DIAGNOSIS — Z7982 Long term (current) use of aspirin: Secondary | ICD-10-CM

## 2023-10-14 DIAGNOSIS — I6381 Other cerebral infarction due to occlusion or stenosis of small artery: Secondary | ICD-10-CM | POA: Diagnosis present

## 2023-10-14 DIAGNOSIS — I639 Cerebral infarction, unspecified: Secondary | ICD-10-CM

## 2023-10-14 DIAGNOSIS — J449 Chronic obstructive pulmonary disease, unspecified: Secondary | ICD-10-CM | POA: Diagnosis not present

## 2023-10-14 DIAGNOSIS — R0689 Other abnormalities of breathing: Secondary | ICD-10-CM | POA: Diagnosis not present

## 2023-10-14 DIAGNOSIS — I959 Hypotension, unspecified: Secondary | ICD-10-CM | POA: Diagnosis not present

## 2023-10-14 DIAGNOSIS — K760 Fatty (change of) liver, not elsewhere classified: Secondary | ICD-10-CM | POA: Diagnosis not present

## 2023-10-14 DIAGNOSIS — R188 Other ascites: Secondary | ICD-10-CM | POA: Diagnosis not present

## 2023-10-14 DIAGNOSIS — I6389 Other cerebral infarction: Secondary | ICD-10-CM | POA: Diagnosis not present

## 2023-10-14 DIAGNOSIS — J9601 Acute respiratory failure with hypoxia: Secondary | ICD-10-CM | POA: Diagnosis present

## 2023-10-14 DIAGNOSIS — G936 Cerebral edema: Secondary | ICD-10-CM | POA: Diagnosis not present

## 2023-10-14 DIAGNOSIS — J189 Pneumonia, unspecified organism: Secondary | ICD-10-CM | POA: Diagnosis not present

## 2023-10-14 DIAGNOSIS — R29810 Facial weakness: Secondary | ICD-10-CM | POA: Diagnosis present

## 2023-10-14 DIAGNOSIS — A419 Sepsis, unspecified organism: Secondary | ICD-10-CM | POA: Diagnosis present

## 2023-10-14 DIAGNOSIS — Z8616 Personal history of COVID-19: Secondary | ICD-10-CM

## 2023-10-14 DIAGNOSIS — R41841 Cognitive communication deficit: Secondary | ICD-10-CM | POA: Diagnosis not present

## 2023-10-14 DIAGNOSIS — E119 Type 2 diabetes mellitus without complications: Secondary | ICD-10-CM

## 2023-10-14 DIAGNOSIS — I5031 Acute diastolic (congestive) heart failure: Secondary | ICD-10-CM | POA: Diagnosis present

## 2023-10-14 DIAGNOSIS — Z9889 Other specified postprocedural states: Secondary | ICD-10-CM

## 2023-10-14 DIAGNOSIS — R0600 Dyspnea, unspecified: Secondary | ICD-10-CM | POA: Diagnosis not present

## 2023-10-14 DIAGNOSIS — N390 Urinary tract infection, site not specified: Secondary | ICD-10-CM | POA: Diagnosis not present

## 2023-10-14 DIAGNOSIS — I6523 Occlusion and stenosis of bilateral carotid arteries: Secondary | ICD-10-CM | POA: Diagnosis not present

## 2023-10-14 DIAGNOSIS — Z87440 Personal history of urinary (tract) infections: Secondary | ICD-10-CM

## 2023-10-14 DIAGNOSIS — T360X5A Adverse effect of penicillins, initial encounter: Secondary | ICD-10-CM | POA: Diagnosis not present

## 2023-10-14 DIAGNOSIS — K8020 Calculus of gallbladder without cholecystitis without obstruction: Secondary | ICD-10-CM | POA: Diagnosis not present

## 2023-10-14 DIAGNOSIS — R918 Other nonspecific abnormal finding of lung field: Secondary | ICD-10-CM | POA: Diagnosis not present

## 2023-10-14 DIAGNOSIS — E1169 Type 2 diabetes mellitus with other specified complication: Secondary | ICD-10-CM | POA: Diagnosis not present

## 2023-10-14 DIAGNOSIS — E039 Hypothyroidism, unspecified: Secondary | ICD-10-CM | POA: Diagnosis present

## 2023-10-14 DIAGNOSIS — G4733 Obstructive sleep apnea (adult) (pediatric): Secondary | ICD-10-CM | POA: Diagnosis not present

## 2023-10-14 DIAGNOSIS — Z8249 Family history of ischemic heart disease and other diseases of the circulatory system: Secondary | ICD-10-CM

## 2023-10-14 DIAGNOSIS — R29700 NIHSS score 0: Secondary | ICD-10-CM | POA: Diagnosis not present

## 2023-10-14 DIAGNOSIS — D320 Benign neoplasm of cerebral meninges: Secondary | ICD-10-CM | POA: Diagnosis not present

## 2023-10-14 DIAGNOSIS — Z48813 Encounter for surgical aftercare following surgery on the respiratory system: Secondary | ICD-10-CM | POA: Diagnosis not present

## 2023-10-14 DIAGNOSIS — R0902 Hypoxemia: Secondary | ICD-10-CM | POA: Diagnosis not present

## 2023-10-14 DIAGNOSIS — K828 Other specified diseases of gallbladder: Secondary | ICD-10-CM | POA: Diagnosis not present

## 2023-10-14 DIAGNOSIS — K766 Portal hypertension: Secondary | ICD-10-CM | POA: Diagnosis present

## 2023-10-14 DIAGNOSIS — G319 Degenerative disease of nervous system, unspecified: Secondary | ICD-10-CM | POA: Diagnosis not present

## 2023-10-14 DIAGNOSIS — D329 Benign neoplasm of meninges, unspecified: Secondary | ICD-10-CM | POA: Diagnosis not present

## 2023-10-14 DIAGNOSIS — I517 Cardiomegaly: Secondary | ICD-10-CM | POA: Diagnosis not present

## 2023-10-14 DIAGNOSIS — I6782 Cerebral ischemia: Secondary | ICD-10-CM | POA: Diagnosis not present

## 2023-10-14 DIAGNOSIS — K7689 Other specified diseases of liver: Secondary | ICD-10-CM | POA: Diagnosis not present

## 2023-10-14 DIAGNOSIS — L271 Localized skin eruption due to drugs and medicaments taken internally: Secondary | ICD-10-CM | POA: Diagnosis not present

## 2023-10-14 DIAGNOSIS — R2681 Unsteadiness on feet: Secondary | ICD-10-CM | POA: Diagnosis not present

## 2023-10-14 DIAGNOSIS — M6259 Muscle wasting and atrophy, not elsewhere classified, multiple sites: Secondary | ICD-10-CM | POA: Diagnosis not present

## 2023-10-14 DIAGNOSIS — E872 Acidosis, unspecified: Secondary | ICD-10-CM | POA: Diagnosis present

## 2023-10-14 DIAGNOSIS — Z90710 Acquired absence of both cervix and uterus: Secondary | ICD-10-CM

## 2023-10-14 DIAGNOSIS — Z8701 Personal history of pneumonia (recurrent): Secondary | ICD-10-CM

## 2023-10-14 DIAGNOSIS — R161 Splenomegaly, not elsewhere classified: Secondary | ICD-10-CM | POA: Diagnosis not present

## 2023-10-14 DIAGNOSIS — E0500 Thyrotoxicosis with diffuse goiter without thyrotoxic crisis or storm: Secondary | ICD-10-CM | POA: Diagnosis not present

## 2023-10-14 DIAGNOSIS — Z79890 Hormone replacement therapy (postmenopausal): Secondary | ICD-10-CM

## 2023-10-14 DIAGNOSIS — E1159 Type 2 diabetes mellitus with other circulatory complications: Secondary | ICD-10-CM | POA: Diagnosis not present

## 2023-10-14 HISTORY — DX: Sepsis, unspecified organism: A41.9

## 2023-10-14 LAB — CBC WITH DIFFERENTIAL/PLATELET
Abs Immature Granulocytes: 0.12 10*3/uL — ABNORMAL HIGH (ref 0.00–0.07)
Basophils Absolute: 0 10*3/uL (ref 0.0–0.1)
Basophils Relative: 0 %
Eosinophils Absolute: 0 10*3/uL (ref 0.0–0.5)
Eosinophils Relative: 0 %
HCT: 45.5 % (ref 36.0–46.0)
Hemoglobin: 14.9 g/dL (ref 12.0–15.0)
Immature Granulocytes: 1 %
Lymphocytes Relative: 7 %
Lymphs Abs: 0.8 10*3/uL (ref 0.7–4.0)
MCH: 31.6 pg (ref 26.0–34.0)
MCHC: 32.7 g/dL (ref 30.0–36.0)
MCV: 96.6 fL (ref 80.0–100.0)
Monocytes Absolute: 0.9 10*3/uL (ref 0.1–1.0)
Monocytes Relative: 8 %
Neutro Abs: 10 10*3/uL — ABNORMAL HIGH (ref 1.7–7.7)
Neutrophils Relative %: 84 %
Platelets: 152 10*3/uL (ref 150–400)
RBC: 4.71 MIL/uL (ref 3.87–5.11)
RDW: 14.3 % (ref 11.5–15.5)
WBC: 11.8 10*3/uL — ABNORMAL HIGH (ref 4.0–10.5)
nRBC: 0 % (ref 0.0–0.2)

## 2023-10-14 LAB — RESP PANEL BY RT-PCR (RSV, FLU A&B, COVID)  RVPGX2
Influenza A by PCR: NEGATIVE
Influenza B by PCR: NEGATIVE
Resp Syncytial Virus by PCR: NEGATIVE
SARS Coronavirus 2 by RT PCR: NEGATIVE

## 2023-10-14 LAB — COMPREHENSIVE METABOLIC PANEL WITH GFR
ALT: 69 U/L — ABNORMAL HIGH (ref 0–44)
AST: 97 U/L — ABNORMAL HIGH (ref 15–41)
Albumin: 3 g/dL — ABNORMAL LOW (ref 3.5–5.0)
Alkaline Phosphatase: 328 U/L — ABNORMAL HIGH (ref 38–126)
Anion gap: 11 (ref 5–15)
BUN: 24 mg/dL — ABNORMAL HIGH (ref 8–23)
CO2: 21 mmol/L — ABNORMAL LOW (ref 22–32)
Calcium: 8.6 mg/dL — ABNORMAL LOW (ref 8.9–10.3)
Chloride: 108 mmol/L (ref 98–111)
Creatinine, Ser: 1.35 mg/dL — ABNORMAL HIGH (ref 0.44–1.00)
GFR, Estimated: 38 mL/min — ABNORMAL LOW (ref >60)
Glucose, Bld: 144 mg/dL — ABNORMAL HIGH (ref 70–99)
Potassium: 3.2 mmol/L — ABNORMAL LOW (ref 3.5–5.1)
Sodium: 140 mmol/L (ref 135–145)
Total Bilirubin: 1.9 mg/dL — ABNORMAL HIGH (ref 0.0–1.2)
Total Protein: 6.6 g/dL (ref 6.5–8.1)

## 2023-10-14 LAB — PROTIME-INR
INR: 1.2 (ref 0.8–1.2)
Prothrombin Time: 15.3 s — ABNORMAL HIGH (ref 11.4–15.2)

## 2023-10-14 LAB — LACTIC ACID, PLASMA
Lactic Acid, Venous: 3.2 mmol/L (ref 0.5–1.9)
Lactic Acid, Venous: 2.5 mmol/L (ref 0.5–1.9)

## 2023-10-14 MED ORDER — SODIUM CHLORIDE 0.9 % IV BOLUS
1000.0000 mL | Freq: Once | INTRAVENOUS | Status: AC
  Administered 2023-10-14: 1000 mL via INTRAVENOUS

## 2023-10-14 MED ORDER — UMECLIDINIUM-VILANTEROL 62.5-25 MCG/ACT IN AEPB
1.0000 | INHALATION_SPRAY | Freq: Every day | RESPIRATORY_TRACT | Status: DC
  Administered 2023-10-15 – 2023-10-22 (×8): 1 via RESPIRATORY_TRACT
  Filled 2023-10-14 (×3): qty 14

## 2023-10-14 MED ORDER — POTASSIUM CHLORIDE 10 MEQ/100ML IV SOLN
10.0000 meq | Freq: Once | INTRAVENOUS | Status: DC
  Filled 2023-10-14: qty 100

## 2023-10-14 MED ORDER — CYCLOSPORINE 0.05 % OP EMUL
1.0000 [drp] | Freq: Two times a day (BID) | OPHTHALMIC | Status: DC
  Administered 2023-10-15 – 2023-10-22 (×15): 1 [drp] via OPHTHALMIC
  Filled 2023-10-14 (×17): qty 30

## 2023-10-14 MED ORDER — TIMOLOL MALEATE 0.5 % OP SOLN
1.0000 [drp] | Freq: Every day | OPHTHALMIC | Status: DC
  Administered 2023-10-15 – 2023-10-22 (×8): 1 [drp] via OPHTHALMIC
  Filled 2023-10-14 (×2): qty 5

## 2023-10-14 MED ORDER — POTASSIUM CHLORIDE 10 MEQ/100ML IV SOLN: 10.0000 meq | INTRAVENOUS | Status: DC

## 2023-10-14 MED ORDER — IPRATROPIUM-ALBUTEROL 0.5-2.5 (3) MG/3ML IN SOLN
3.0000 mL | RESPIRATORY_TRACT | Status: DC | PRN
  Administered 2023-10-15 (×3): 3 mL via RESPIRATORY_TRACT
  Filled 2023-10-14 (×3): qty 3

## 2023-10-14 MED ORDER — PIPERACILLIN-TAZOBACTAM 3.375 G IVPB
3.3750 g | Freq: Once | INTRAVENOUS | Status: AC
  Administered 2023-10-14: 3.375 g via INTRAVENOUS
  Filled 2023-10-14: qty 50

## 2023-10-14 MED ORDER — ACETAMINOPHEN 650 MG RE SUPP: 650.0000 mg | Freq: Four times a day (QID) | RECTAL | Status: DC | PRN

## 2023-10-14 MED ORDER — ASPIRIN 81 MG PO TBEC
81.0000 mg | DELAYED_RELEASE_TABLET | Freq: Every day | ORAL | Status: DC
  Administered 2023-10-15 – 2023-10-22 (×8): 81 mg via ORAL
  Filled 2023-10-14 (×8): qty 1

## 2023-10-14 MED ORDER — ACETAMINOPHEN 325 MG PO TABS
650.0000 mg | ORAL_TABLET | Freq: Four times a day (QID) | ORAL | Status: DC | PRN
  Administered 2023-10-22: 650 mg via ORAL
  Filled 2023-10-14 (×2): qty 2

## 2023-10-14 MED ORDER — ENOXAPARIN SODIUM 30 MG/0.3ML IJ SOSY
30.0000 mg | PREFILLED_SYRINGE | INTRAMUSCULAR | Status: DC
  Administered 2023-10-15: 30 mg via SUBCUTANEOUS
  Filled 2023-10-14: qty 0.3

## 2023-10-14 MED ORDER — LACTATED RINGERS IV SOLN: INTRAVENOUS | Status: DC

## 2023-10-14 MED ORDER — ONDANSETRON HCL 4 MG/2ML IJ SOLN
4.0000 mg | Freq: Four times a day (QID) | INTRAMUSCULAR | Status: DC | PRN
Start: 2023-10-14 — End: 2023-10-22

## 2023-10-14 MED ORDER — VALACYCLOVIR HCL 500 MG PO TABS
500.0000 mg | ORAL_TABLET | Freq: Every day | ORAL | Status: DC
  Administered 2023-10-15 – 2023-10-21 (×7): 500 mg via ORAL
  Filled 2023-10-14 (×8): qty 1

## 2023-10-14 MED ORDER — SODIUM CHLORIDE 0.9% FLUSH
3.0000 mL | Freq: Two times a day (BID) | INTRAVENOUS | Status: DC
  Administered 2023-10-14 – 2023-10-22 (×15): 3 mL via INTRAVENOUS

## 2023-10-14 MED ORDER — VANCOMYCIN HCL 1750 MG/350ML IV SOLN
1750.0000 mg | Freq: Once | INTRAVENOUS | Status: AC
  Administered 2023-10-14: 1750 mg via INTRAVENOUS
  Filled 2023-10-14: qty 350

## 2023-10-14 MED ORDER — SODIUM CHLORIDE 0.9 % IV SOLN
2.0000 g | INTRAVENOUS | Status: DC
  Administered 2023-10-15 – 2023-10-17 (×3): 2 g via INTRAVENOUS
  Filled 2023-10-14 (×3): qty 20

## 2023-10-14 MED ORDER — ONDANSETRON HCL 4 MG PO TABS: 4.0000 mg | ORAL_TABLET | Freq: Four times a day (QID) | ORAL | Status: DC | PRN

## 2023-10-14 MED ORDER — SODIUM CHLORIDE 0.9 % IV BOLUS: 500.0000 mL | Freq: Once | INTRAVENOUS | Status: DC

## 2023-10-14 MED ORDER — POTASSIUM CHLORIDE 10 MEQ/100ML IV SOLN
10.0000 meq | Freq: Once | INTRAVENOUS | Status: AC
  Administered 2023-10-14: 10 meq via INTRAVENOUS

## 2023-10-14 MED ORDER — FAMOTIDINE 20 MG PO TABS
20.0000 mg | ORAL_TABLET | Freq: Two times a day (BID) | ORAL | Status: DC
  Administered 2023-10-15 – 2023-10-22 (×16): 20 mg via ORAL
  Filled 2023-10-14 (×17): qty 1

## 2023-10-14 MED ORDER — POTASSIUM CHLORIDE 10 MEQ/100ML IV SOLN
10.0000 meq | Freq: Once | INTRAVENOUS | Status: AC
  Administered 2023-10-14: 10 meq via INTRAVENOUS
  Filled 2023-10-14: qty 100

## 2023-10-14 MED ORDER — PREDNISOLONE ACETATE 1 % OP SUSP
2.0000 [drp] | Freq: Four times a day (QID) | OPHTHALMIC | Status: DC
  Administered 2023-10-15 – 2023-10-16 (×4): 2 [drp] via OPHTHALMIC
  Filled 2023-10-14: qty 5

## 2023-10-14 MED ORDER — POTASSIUM CHLORIDE 20 MEQ PO PACK: 40.0000 meq | PACK | Freq: Two times a day (BID) | ORAL | Status: DC

## 2023-10-14 MED ORDER — POLYETHYLENE GLYCOL 3350 17 G PO PACK: 17.0000 g | PACK | Freq: Every day | ORAL | Status: DC | PRN

## 2023-10-14 NOTE — Assessment & Plan Note (Signed)
Continue CPAP at bedtime 

## 2023-10-14 NOTE — Assessment & Plan Note (Signed)
 Continue home Synthroid

## 2023-10-14 NOTE — Progress Notes (Signed)
 PHARMACY CONSULT NOTE - FOLLOW UP  Pharmacy Consult for Electrolyte Monitoring and Replacement   Recent Labs: Potassium (mmol/L)  Date Value  10/14/2023 3.2 (L)   Magnesium  (mg/dL)  Date Value  16/03/9603 2.0   Calcium  (mg/dL)  Date Value  54/02/8118 8.6 (L)   Albumin  (g/dL)  Date Value  14/78/2956 3.0 (L)   Phosphorus (mg/dL)  Date Value  21/30/8657 2.9   Sodium (mmol/L)  Date Value  10/14/2023 140     Assessment: 4/21 @ 1946:  K = 3.2  Goal of Therapy:  Electrolytes WNL   Plan:  KCl 10 mEq IV X 1 given in ED on 4/21 @ 2130. KCl 10 mEq IV X 1 ordered for 4/22 @ ~ 0000. Additional KCl 10 mEq IV X 1 ordered for 4/22 @ 0100. - recheck electrolytes on 4/22 with AM labs.   Alvenia Job ,PharmD Clinical Pharmacist 10/14/2023 11:23 PM

## 2023-10-14 NOTE — ED Provider Notes (Signed)
 [facility] Provider Note    Event Date/Time   First MD Initiated Contact with Patient 10/14/23 1933     (approximate)   History   Fatigue  Pt arrives via ems from home with c/o AMS, lethargy, and weakness. LKW at 0800 today. Per ems pt was found slumped up in recliner and not to her baseline. Normally pt is A&Ox4 and is ambulatory with a cane, but daughter states she saw the pt at 0800 and called ems due to pt not being able to performe daily living tasks. Per ems pt hypotensive at 77/42. Ems administered 500ml of LR and 750ml of NS and brought BP to 123/62. Glucose 155. Pt at baseline is on room air but is on 4L of O2 at 100%   HPI Lauren Morris is a 89 y.o. female PMH multiple medical comorbidities including hypertension, meningioma, Graves' disease, prior UTIs, chronic bronchitis, DNR status presents for evaluation of altered mental status. -Per EMS, patient was last seen normal around 8 AM when she was at home.  Normally AO x 4.  Family came and saw her this evening, and she was minimally responsive.  Called ambulance, found to be hypotensive to 70s over 50s.  Given some 750 cc IV fluid and brought to emergency department for eval. - On my evaluation, patient has no complaints but does endorse she feels weak.  Endorses urinary frequency but otherwise no urinary symptoms.  No cough, abdominal pain, nausea/vomiting/diarrhea.  Denies any recent falls. - Collateral gathered from daughter at bedside, states mother slept most of the day yesterday and seems to have done so again today.  She was acting strangely earlier in the evening, attempted to call family members but then would not speak.  Felt she may have been leaning to the left but did not notice any obvious facial droop.  Says she was not speaking much but not grossly altered.  Does note history of prior UTIs.  Goals of care discussion with patient and daughter, patient confirms DNR/DNI with limited interventions.   At this time would not be amenable to invasive interventions such as central line placement though is amenable to antibiotics.      Physical Exam   Triage Vital Signs: Most recent vital signs: Vitals:   10/14/23 2330 10/14/23 2345  BP: (!) 81/39 (!) 92/43  Pulse: 83 82  Resp: 18 17  Temp:    SpO2: 93% 93%  Temp 99.7 oral   General: Awake, no distress.  HEENT:  atraumatic CV:  Good peripheral perfusion. RRR, RP 2+ Resp:  Mild tachypnea, CTAB Abd:  No distention. Nontender to deep palpation throughout Neuro:  Alert, interactive, CN II-XII intact, FNF wnl, finger taps fast b/l, 5/5 strength in bilateral finger extension/grip, arm flexion/extension, EHL/FHL. BUE AG 10+ sec no drift, BLE AG 5+ sec no drift.  SILT.  Gait testing deferred.   ED Results / Procedures / Treatments   Labs (all labs ordered are listed, but only abnormal results are displayed) Labs Reviewed  LACTIC ACID, PLASMA - Abnormal; Notable for the following components:      Result Value   Lactic Acid, Venous 3.2 (*)    All other components within normal limits  LACTIC ACID, PLASMA - Abnormal; Notable for the following components:   Lactic Acid, Venous 2.5 (*)    All other components within normal limits  COMPREHENSIVE METABOLIC PANEL WITH GFR - Abnormal; Notable for the following components:   Potassium 3.2 (*)  CO2 21 (*)    Glucose, Bld 144 (*)    BUN 24 (*)    Creatinine, Ser 1.35 (*)    Calcium  8.6 (*)    Albumin  3.0 (*)    AST 97 (*)    ALT 69 (*)    Alkaline Phosphatase 328 (*)    Total Bilirubin 1.9 (*)    GFR, Estimated 38 (*)    All other components within normal limits  CBC WITH DIFFERENTIAL/PLATELET - Abnormal; Notable for the following components:   WBC 11.8 (*)    Neutro Abs 10.0 (*)    Abs Immature Granulocytes 0.12 (*)    All other components within normal limits  PROTIME-INR - Abnormal; Notable for the following components:   Prothrombin Time 15.3 (*)    All other components  within normal limits  RESP PANEL BY RT-PCR (RSV, FLU A&B, COVID)  RVPGX2  CULTURE, BLOOD (ROUTINE X 2)  CULTURE, BLOOD (ROUTINE X 2)  URINALYSIS, W/ REFLEX TO CULTURE (INFECTION SUSPECTED)  COMPREHENSIVE METABOLIC PANEL WITH GFR  CBC  MAGNESIUM      EKG  Ecg = nsr, rate 83, right bundle branch block present.  No obvious ST elevation or depression.  No significant repolarization abnormality.  No clear evidence of ischemia or arrhythmia on my read.   RADIOLOGY CT head with evidence of right-sided CVA on my read, radiology report reviewed, suspect late acute to subacute  PROCEDURES:  Critical Care performed: Yes, see critical care procedure note(s)  .Critical Care  Performed by: Collis Deaner, MD Authorized by: Collis Deaner, MD   Critical care provider statement:    Critical care time (minutes):  30   Critical care was necessary to treat or prevent imminent or life-threatening deterioration of the following conditions:  Sepsis and shock   Critical care was time spent personally by me on the following activities:  Development of treatment plan with patient or surrogate, discussions with consultants, evaluation of patient's response to treatment, examination of patient, ordering and review of laboratory studies, ordering and review of radiographic studies, ordering and performing treatments and interventions, pulse oximetry, re-evaluation of patient's condition and review of old charts   I assumed direction of critical care for this patient from another provider in my specialty: no     Care discussed with: admitting provider      MEDICATIONS ORDERED IN ED: Medications  enoxaparin  (LOVENOX ) injection 30 mg (30 mg Subcutaneous Given 10/15/23 0015)  sodium chloride  flush (NS) 0.9 % injection 3 mL (3 mLs Intravenous Given 10/14/23 2353)  cefTRIAXone  (ROCEPHIN ) 2 g in sodium chloride  0.9 % 100 mL IVPB (has no administration in time range)  acetaminophen  (TYLENOL ) tablet 650 mg (has  no administration in time range)    Or  acetaminophen  (TYLENOL ) suppository 650 mg (has no administration in time range)  polyethylene glycol (MIRALAX  / GLYCOLAX ) packet 17 g (has no administration in time range)  ondansetron  (ZOFRAN ) tablet 4 mg (has no administration in time range)    Or  ondansetron  (ZOFRAN ) injection 4 mg (has no administration in time range)  ipratropium-albuterol  (DUONEB) 0.5-2.5 (3) MG/3ML nebulizer solution 3 mL (has no administration in time range)  lactated ringers  infusion ( Intravenous New Bag/Given 10/15/23 0015)  aspirin  EC tablet 81 mg (81 mg Oral Given 10/15/23 0015)  famotidine  (PEPCID ) tablet 20 mg (20 mg Oral Given 10/15/23 0015)  cycloSPORINE  (RESTASIS ) 0.05 % ophthalmic emulsion 1 drop (has no administration in time range)  prednisoLONE  acetate (PRED FORTE ) 1 %  ophthalmic suspension 2 drop (has no administration in time range)  timolol  (TIMOPTIC ) 0.5 % ophthalmic solution 1 drop (has no administration in time range)  valACYclovir  (VALTREX ) tablet 500 mg (has no administration in time range)  umeclidinium-vilanterol (ANORO ELLIPTA ) 62.5-25 MCG/ACT 1 puff (has no administration in time range)  sodium chloride  0.9 % bolus 1,000 mL (0 mLs Intravenous Stopped 10/14/23 2130)  piperacillin -tazobactam (ZOSYN ) IVPB 3.375 g (0 g Intravenous Stopped 10/15/23 0017)  sodium chloride  0.9 % bolus 1,000 mL (0 mLs Intravenous Stopped 10/14/23 2230)  vancomycin  (VANCOREADY) IVPB 1750 mg/350 mL (0 mg Intravenous Stopped 10/14/23 2353)  potassium chloride  10 mEq in 100 mL IVPB (0 mEq Intravenous Stopped 10/14/23 2230)  potassium chloride  10 mEq in 100 mL IVPB (0 mEq Intravenous Stopped 10/14/23 2353)     IMPRESSION / MDM / ASSESSMENT AND PLAN / ED COURSE  I reviewed the triage vital signs and the nursing notes.                              DDX/MDM/AP: Differential diagnosis includes, but is not limited to, possible underlying infection UTI, pneumonia, doubt intra-abdominal  pathology given benign exam.  Consider intracranial pathology including hemorrhage.  Doubt ACS or arrhythmia.  Consider possible prior postictal state.  No evidence of CVA at time of my eval, consider possible TIA.  Plan: - Labs - N.p.o. - Chest x-ray - EKG - CT head - IV fluids - Low threshold for antibiotics - Anticipate admission  Patient's presentation is most consistent with acute presentation with potential threat to life or bodily function.  The patient is on the cardiac monitor to evaluate for evidence of arrhythmia and/or significant heart rate changes.  ED course below.  Workup with leukocytosis to 11.8 with left shift, concerning for sepsis especially in the setting of hypotension, broad-spectrum antibiotics added.  Given total 2000 cc IV fluid in the emergency department and received an additional 750 cc prior to arrival, will defer further fluid at this time as blood pressure appears to be improving and stable and desire to avoid hypervolemia in this 89 year old patient.  Treated with broad-spectrum antibiotics.  CT head concerning for CVA, likely late acute to subacute, outside of any acute stroke treatment window.  Admitted to medicine service.  Unclear source of infection at this time, UA pending.  Right upper quadrant ultrasound unremarkable.  Clinical Course as of 10/15/23 0026  Mon Oct 14, 2023  2117 Viral swab negative [MM]  2117 CMP with mild hypokalemia, will replete.  AKI present, getting IV fluid.  Appears to chronically have mild transaminitis which is stable. [MM]  2120 CT head with no bleeding on my interpretation, ?  old infarct right frontal region  Formal read pending [MM]  2122 Chest x-ray with no obvious consolidation on my read, similar to prior [MM]  2124 Hospitalist consult order placed  Right upper quadrant ultrasound added to ensure no acute biliary pathology though LFTs overall similar to prior and patient with no associated symptoms.  Surgery would  not be within goals of care. [MM]  2341 Call from radiology, no right basal ganglia subacute to late acute stroke [MM]    Clinical Course User Index [MM] Collis Deaner, MD     FINAL CLINICAL IMPRESSION(S) / ED DIAGNOSES   Final diagnoses:  Severe sepsis [facility])  Cerebrovascular accident (CVA), unspecified mechanism (HCC)  Septic shock (HCC)     Rx / DC Orders  ED Discharge Orders     None        Note:  This document was prepared using Dragon voice recognition software and may include unintentional dictation errors.   Collis Deaner, MD 10/15/23 [phone]

## 2023-10-14 NOTE — Assessment & Plan Note (Signed)
 Diet controlled type 2 diabetes with last A1c of 5.9%.  - No indication for SSI at this time

## 2023-10-14 NOTE — H&P (Signed)
 History and Physical    Patient: Lauren Morris ZOX:[id number] DOB: 15-Oct-1931 DOA: 10/14/2023 DOS: the patient was seen and examined on 10/14/2023 PCP: Thersia Flax, MD  Patient coming from: Home  Chief Complaint:  Chief Complaint  Patient presents with   Fatigue   HPI: Lauren Morris is a 89 y.o. female with medical history significant of recurrent Klebsiella UTI, type 2 diabetes, Graves' disease now with acquired hypothyroidism, hypertension, hyperlipidemia, OSA on CPAP, recurrent bronchitis, meningioma, who presents to the ED due to altered mental status.  History obtained from both Lauren Morris and her daughter at bedside.  Lauren Morris states that over the last 1 day, she has noticed increasing difficulty with walking, and this usually happens when she has an underlying UTI.  Her daughter at bedside states that yesterday, Lauren Morris slept all day, which is unusual for her.  Then she checked on her today at 8 AM, and she seemed close to her baseline.  When she checked on her again at approximately 5 or 6 PM, Lauren Morris was laying in her recliner, and still in her pajamas.  She has not gotten up or eaten all day.  She states that this is markedly unusual as Lauren Morris usually sticks to a schedule.  She seemed confused at the time but was still able to answer questions appropriately.  Mrs. Dever denies any nausea, vomiting, chest pain, shortness of breath, palpitations, lower extremity swelling, cough, rhinorrhea, sinus congestion.  She denies any urinary symptoms at this time other than frequency, which she states is chronic.  ED course: On arrival to the ED, patient was hypotensive at 84/66 with heart rate of 87.  She was saturating at 92% on room air.  She was afebrile at 99.7.  She was tachypneic at 24/min.  Initial workup notable for WBC of 11.8, potassium 3.2, bicarb 21, creatinine 1.35, BUN 24, alkaline phosphatase 328, AST 97, ALT 69, total bilirubin 1.9, GFR 38.  Lactic acid 3.2.  Influenza, RSV and  COVID-19 negative.  Chest x-ray and CT head obtained with results pending.  Patient given 2 L of IV fluids, Zosyn  and vancomycin .  TRH contacted for admission.  Review of Systems: As mentioned in the history of present illness. All other systems reviewed and are negative.  Past Medical History:  Diagnosis Date   Community acquired pneumonia 04/05/2016   Cyst    hx o on right breast   DJD (degenerative joint disease)    right knee   Glaucoma    Hypertension    Pneumonia due to COVID-19 virus 12/12/2020   Sleep apnea    Thyroid  disease    Past Surgical History:  Procedure Laterality Date   ABDOMINAL HYSTERECTOMY     VITRECTOMY  July 2012   right eye with membrane peel [facility], [place])   Social History:  reports that she has never smoked. She has never used smokeless tobacco. She reports that she does not drink alcohol  and does not use drugs.  No Known Allergies  Family History  Problem Relation Age of Onset   Cancer Father    Hypertension Father    Heart attack Mother    Hypertension Mother     Prior to Admission medications   Medication Sig Start Date End Date Taking? Authorizing Provider  albuterol  (PROVENTIL ) (2.5 MG/3ML) 0.083% nebulizer solution Take 3 mLs (2.5 mg total) by nebulization every 6 (six) hours as needed for wheezing or shortness of breath. 10/13/22   Sreenath, Daymon Evans,  MD  amoxicillin -clavulanate (AUGMENTIN ) 875-125 MG tablet Take 1 tablet by mouth 2 (two) times daily. 09/05/23   Narendra, Nischal, MD  Ascorbic Acid  (VITAMIN C ) 1000 MG tablet Take 1 tablet (1,000 mg total) by mouth daily. 09/04/21   Thersia Flax, MD  Aspirin  81 MG CAPS Take 1 capsule by mouth daily at 6 (six) AM.    [provider]  atenolol  (TENORMIN ) 25 MG tablet Take 1 tablet (25 mg total) by mouth 2 (two) times daily. 09/10/23   Thersia Flax, MD  Calcium  Carb-Cholecalciferol (CALCIUM  1000 + D PO) Take 1 tablet by mouth daily.    [provider]  Difluprednate 0.05 %  EMUL Place 1 drop into the left eye 2 (two) times daily. 02/07/23   [provider]  dorzolamide -timolol  (COSOPT ) 22.3-6.8 MG/ML ophthalmic solution Place 1 drop into both eyes 2 (two) times daily.    [provider]  famotidine  (PEPCID ) 20 MG tablet Take 1 tablet (20 mg total) by mouth 2 (two) times daily. 09/10/23   Thersia Flax, MD  fish oil-omega-3 fatty acids  1000 MG capsule Take 2 capsules (2 g total) by mouth daily. 09/04/21   Tullo, Teresa L, MD  guaiFENesin  (MUCINEX ) 600 MG 12 hr tablet Take 1 tablet (600 mg total) by mouth 2 (two) times daily. 10/13/22   Sreenath, Sudheer B, MD  melatonin 5 MG TABS Take 1 tablet (5 mg total) by mouth at bedtime as needed. Patient not taking: Reported on 09/10/2023 10/13/22   Tiajuana Fluke, MD  Multiple Vitamin (MULTIVITAMIN WITH MINERALS) TABS tablet Take 1 tablet by mouth daily. 11/27/20   Regalado, Belkys A, MD  RESTASIS  0.05 % ophthalmic emulsion Place 1 drop into both eyes 2 (two) times daily. 10/09/22   [provider]  telmisartan  (MICARDIS ) 80 MG tablet Take 1 tablet (80 mg total) by mouth at bedtime. 09/12/23   Thersia Flax, MD  timolol  (TIMOPTIC ) 0.5 % ophthalmic solution SMARTSIG:In Eye(s) 08/06/23   [provider]  umeclidinium-vilanterol (ANORO ELLIPTA ) 62.5-25 MCG/ACT AEPB Inhale 1 puff into the lungs daily. 11/26/22   Thersia Flax, MD  valACYclovir  (VALTREX ) 500 MG tablet Take 500 mg by mouth at bedtime. Patient not taking: Reported on 09/10/2023 09/17/22   [provider]  Zinc  Oxide (TRIPLE PASTE) 12.8 % ointment Apply 1 Application topically. Every shift    [provider]  zinc  sulfate 220 (50 Zn) MG capsule Take 1 capsule (220 mg total) by mouth daily. 09/04/21   Thersia Flax, MD    Physical Exam: Vitals:   10/14/23 2221 10/14/23 2222 10/14/23 2225 10/14/23 2230  BP:   (!) 106/56 (!) 104/56  Pulse: 83 84 84 85  Resp: (!) 25 (!) 22 (!) 24 (!) 24  Temp:      TempSrc:       SpO2: 99% 97% 96% 98%  Weight:      Height:       Physical Exam Vitals and nursing note reviewed.  Constitutional:      Appearance: She is normal weight. She is ill-appearing.  HENT:     Head: Normocephalic and atraumatic.     Mouth/Throat:     Pharynx: Oropharynx is clear.     Comments: Very dry oropharynx Eyes:     Extraocular Movements: Extraocular movements intact.     Conjunctiva/sclera: Conjunctivae normal.     Pupils: Pupils are equal, round, and reactive to light.  Cardiovascular:     Rate and Rhythm:  Normal rate and regular rhythm.     Heart sounds: No murmur heard. Pulmonary:     Effort: Pulmonary effort is normal. Tachypnea present.     Breath sounds: Transmitted upper airway sounds present. No rhonchi or rales.     Comments: Auditory upper airway wheezing heard, with no posterior wheezing Abdominal:     General: Bowel sounds are normal. There is no distension.     Palpations: Abdomen is soft.     Tenderness: There is no abdominal tenderness. There is no guarding.  Musculoskeletal:     Right lower leg: No edema.     Left lower leg: No edema.  Skin:    General: Skin is warm and dry.  Neurological:     Mental Status: She is alert.     Comments: Patient is alert and oriented to person, place, time and situation.  4/5 strength throughout  Psychiatric:        Mood and Affect: Mood normal.        Behavior: Behavior normal.    Data Reviewed: CBC with WBC of 11.8, hemoglobin of 14.9, MCV of 96.6, platelets 152 CMP with sodium of 140, potassium 3.2, bicarb 21, glucose 144, creatinine 1.35, AST 97, ALT 69, alkaline phosphatase 328, total bilirubin 1.9 and GFR 38 Lactic acid 3.2 COVID-19, influenza and RSV PCR negative  EKG personally reviewed.  Sinus rhythm with a rate of 83.  First-degree AV block.  Right bundle branch block.  Otherwise, no acute ischemic changes.  No results found.  Results are pending, will review when available.  Assessment and Plan:  *  Septic shock (HCC) Patient is presenting with altered mental status, found to be significantly hypotensive and tachypneic with leukocytosis and elevated lactic acid.  Rectal temperature of 99.7.  Patient has a history of recurrent Klebsiella UTI; in the absence of any other sources, will assume that this is urinary in nature.  No new urinary symptoms reported, however patient is a poor historian.  - Telemetry monitoring - S/p 2.75 L bolus - Continue gentle maintenance fluids given prior history of flash pulmonary edema - Blood and urine cultures - Urinalysis - Chest x-ray - If recurrent hypotension nonresponsive to fluids, patient would be amenable to a short course of peripheral vasopressor only.  Not amenable to central line placement or any other advanced interventions  UTI (urinary tract infection) Per chart review, patient has a history of Klebsiella pneumonia UTI with last culture demonstrating resistance to ampicillin, Macrobid.  - S/p vancomycin  and Zosyn  - Continue with Rocephin  2 g daily - Urine culture  AKI (acute kidney injury) (HCC) Secondary to sepsis.  - IV fluids as noted above - Repeat BMP in the a.m. - Hold nephrotoxic agents - Bladder scan with strict in and out  OSA (obstructive sleep apnea) - Continue CPAP at bedtime  COPD (chronic obstructive pulmonary disease) (HCC) Previous suspicion for COPD, however PFTs have been unremarkable.  She is still on chronic Anoro.  Wheezing heard on examination, however more consistent with transmitted upper airway with severely dry oropharynx.  Patient denies any cough, shortness of breath.  - Continue home bronchodilators - DuoNebs as needed  Fatty liver disease, nonalcoholic History of MAFLD with chronically elevated LFTs, unchanged today.  EDP has ordered a right upper quadrant ultrasound.  - Follow-up ultrasound results - Continue to trend LFTs while admitted  Type 2 diabetes mellitus (HCC) Diet controlled type 2  diabetes with last A1c of 5.9%.  - No indication for SSI  at this time  Acquired hypothyroidism - Continue home Synthroid   Essential hypertension - Hold home antihypertensives in the setting of hypotension  Advance Care Planning:   Code Status: Limited: Do not attempt resuscitation (DNR) -DNR-LIMITED -Do Not Intubate/DNI  confirmed by patient and her daughter at bedside  Consults: None  Family Communication: Patient's daughter updated at bedside  Severity of Illness: The appropriate patient status for this patient is INPATIENT. Inpatient status is judged to be reasonable and necessary in order to provide the required intensity of service to ensure the patient's safety. The patient's presenting symptoms, physical exam findings, and initial radiographic and laboratory data in the context of their chronic comorbidities is felt to place them at high risk for further clinical deterioration. Furthermore, it is not anticipated that the patient will be medically stable for discharge from the hospital within 2 midnights of admission.   * I certify that at the point of admission it is my clinical judgment that the patient will require inpatient hospital care spanning beyond 2 midnights from the point of admission due to high intensity of service, high risk for further deterioration and high frequency of surveillance required.*  Author: Avi Body, MD 10/14/2023 11:10 PM  For on call review [email or web address].

## 2023-10-14 NOTE — Progress Notes (Signed)
 ED Pharmacy Antibiotic Sign Off An antibiotic consult was received from an ED provider for vancomycin  per pharmacy dosing for sepsis. A chart review was completed to assess appropriateness.   The following one time order(s) were placed:  Vancomycin  1.75 g IV x 1  Further antibiotic and/or antibiotic pharmacy consults should be ordered by the admitting provider if indicated.   Thank you for allowing pharmacy to be a part of this patient's care.   Alice Innocent, [facility]  Clinical Pharmacist 10/14/23 8:15 PM

## 2023-10-14 NOTE — Assessment & Plan Note (Signed)
 Per chart review, patient has a history of Klebsiella pneumonia UTI with last culture demonstrating resistance to ampicillin, Macrobid.  - S/p vancomycin  and Zosyn  - Continue with Rocephin  2 g daily - Urine culture

## 2023-10-14 NOTE — Assessment & Plan Note (Signed)
 Secondary to sepsis.  - IV fluids as noted above - Repeat BMP in the a.m. - Hold nephrotoxic agents - Bladder scan with strict in and out

## 2023-10-14 NOTE — Assessment & Plan Note (Signed)
 -  Hold home antihypertensives in the setting of hypotension

## 2023-10-14 NOTE — ED Triage Notes (Signed)
 Pt arrives via ems from home with c/o AMS, lethargy, and weakness. LKW at 0800 today. Per ems pt was found slumped up in recliner and not to her baseline. Normally pt is A&Ox4 and is ambulatory with a cane, but daughter states she saw the pt at 0800 and called ems due to pt not being able to performe daily living tasks. Per ems pt hypotensive at 77/42. Ems administered 500ml of LR and 750ml of NS and brought BP to 123/62. Glucose 155. Pt at baseline is on room air but is on 4L of O2 at 100%

## 2023-10-14 NOTE — Assessment & Plan Note (Addendum)
 Previous suspicion for COPD, however PFTs have been unremarkable.  She is still on chronic Anoro.  Wheezing heard on examination, however more consistent with transmitted upper airway with severely dry oropharynx.  Patient denies any cough, shortness of breath.  - Continue home bronchodilators - DuoNebs as needed

## 2023-10-14 NOTE — Assessment & Plan Note (Signed)
 History of MAFLD with chronically elevated LFTs, unchanged today.  EDP has ordered a right upper quadrant ultrasound.  - Follow-up ultrasound results - Continue to trend LFTs while admitted

## 2023-10-14 NOTE — Assessment & Plan Note (Addendum)
 Patient is presenting with altered mental status, found to be significantly hypotensive and tachypneic with leukocytosis and elevated lactic acid.  Rectal temperature of 99.7.  Patient has a history of recurrent Klebsiella UTI; in the absence of any other sources, will assume that this is urinary in nature.  No new urinary symptoms reported, however patient is a poor historian.  - Telemetry monitoring - S/p 2.75 L bolus - Continue gentle maintenance fluids given prior history of flash pulmonary edema - Blood and urine cultures - Urinalysis - Chest x-ray - If recurrent hypotension nonresponsive to fluids, patient would be amenable to a short course of peripheral vasopressor only.  Not amenable to central line placement or any other advanced interventions

## 2023-10-15 ENCOUNTER — Inpatient Hospital Stay

## 2023-10-15 ENCOUNTER — Encounter: Payer: Self-pay | Admitting: Radiology

## 2023-10-15 ENCOUNTER — Inpatient Hospital Stay (HOSPITAL_COMMUNITY)
Admit: 2023-10-15 | Discharge: 2023-10-15 | Disposition: A | Discharge status: Home / Self Care | Attending: Neurology | Admitting: Neurology

## 2023-10-15 DIAGNOSIS — R6521 Severe sepsis with septic shock: Secondary | ICD-10-CM | POA: Diagnosis not present

## 2023-10-15 DIAGNOSIS — I639 Cerebral infarction, unspecified: Secondary | ICD-10-CM | POA: Diagnosis not present

## 2023-10-15 DIAGNOSIS — A419 Sepsis, unspecified organism: Secondary | ICD-10-CM | POA: Diagnosis not present

## 2023-10-15 DIAGNOSIS — K8020 Calculus of gallbladder without cholecystitis without obstruction: Secondary | ICD-10-CM

## 2023-10-15 DIAGNOSIS — R29700 NIHSS score 0: Secondary | ICD-10-CM

## 2023-10-15 DIAGNOSIS — I6389 Other cerebral infarction: Secondary | ICD-10-CM | POA: Diagnosis not present

## 2023-10-15 DIAGNOSIS — K760 Fatty (change of) liver, not elsewhere classified: Secondary | ICD-10-CM | POA: Diagnosis not present

## 2023-10-15 LAB — URINALYSIS, W/ REFLEX TO CULTURE (INFECTION SUSPECTED)
Bilirubin Urine: NEGATIVE
Glucose, UA: NEGATIVE mg/dL
Ketones, ur: NEGATIVE mg/dL
Leukocytes,Ua: NEGATIVE
Nitrite: NEGATIVE
Protein, ur: NEGATIVE mg/dL
RBC / HPF: 0 RBC/hpf (ref 0–5)
Specific Gravity, Urine: 1.025 (ref 1.005–1.030)
pH: 5 (ref 5.0–8.0)

## 2023-10-15 LAB — COMPREHENSIVE METABOLIC PANEL WITH GFR
ALT: 60 U/L — ABNORMAL HIGH (ref 0–44)
AST: 76 U/L — ABNORMAL HIGH (ref 15–41)
Albumin: 2.6 g/dL — ABNORMAL LOW (ref 3.5–5.0)
Alkaline Phosphatase: 248 U/L — ABNORMAL HIGH (ref 38–126)
Anion gap: 8 (ref 5–15)
BUN: 24 mg/dL — ABNORMAL HIGH (ref 8–23)
CO2: 18 mmol/L — ABNORMAL LOW (ref 22–32)
Calcium: 7.7 mg/dL — ABNORMAL LOW (ref 8.9–10.3)
Chloride: 112 mmol/L — ABNORMAL HIGH (ref 98–111)
Creatinine, Ser: 1.05 mg/dL — ABNORMAL HIGH (ref 0.44–1.00)
GFR, Estimated: 51 mL/min — ABNORMAL LOW (ref >60)
Glucose, Bld: 166 mg/dL — ABNORMAL HIGH (ref 70–99)
Potassium: 4 mmol/L (ref 3.5–5.1)
Sodium: 138 mmol/L (ref 135–145)
Total Bilirubin: 1.6 mg/dL — ABNORMAL HIGH (ref 0.0–1.2)
Total Protein: 6.1 g/dL — ABNORMAL LOW (ref 6.5–8.1)

## 2023-10-15 LAB — CBC
HCT: 41.1 % (ref 36.0–46.0)
Hemoglobin: 13.2 g/dL (ref 12.0–15.0)
MCH: 31.7 pg (ref 26.0–34.0)
MCHC: 32.1 g/dL (ref 30.0–36.0)
MCV: 98.8 fL (ref 80.0–100.0)
Platelets: 119 10*3/uL — ABNORMAL LOW (ref 150–400)
RBC: 4.16 MIL/uL (ref 3.87–5.11)
RDW: 14.6 % (ref 11.5–15.5)
WBC: 16.7 10*3/uL — ABNORMAL HIGH (ref 4.0–10.5)
nRBC: 0 % (ref 0.0–0.2)

## 2023-10-15 LAB — MAGNESIUM: Magnesium: 1.8 mg/dL (ref 1.7–2.4)

## 2023-10-15 LAB — LACTIC ACID, PLASMA: Lactic Acid, Venous: 1.3 mmol/L (ref 0.5–1.9)

## 2023-10-15 MED ORDER — METRONIDAZOLE 500 MG/100ML IV SOLN
500.0000 mg | Freq: Two times a day (BID) | INTRAVENOUS | Status: DC
  Administered 2023-10-15 – 2023-10-17 (×5): 500 mg via INTRAVENOUS
  Filled 2023-10-15 (×5): qty 100

## 2023-10-15 MED ORDER — FUROSEMIDE 10 MG/ML IJ SOLN
80.0000 mg | Freq: Once | INTRAMUSCULAR | Status: AC
  Administered 2023-10-15: 80 mg via INTRAVENOUS
  Filled 2023-10-15: qty 8

## 2023-10-15 MED ORDER — ENOXAPARIN SODIUM 40 MG/0.4ML IJ SOSY
40.0000 mg | PREFILLED_SYRINGE | INTRAMUSCULAR | Status: DC
  Administered 2023-10-15 – 2023-10-21 (×7): 40 mg via SUBCUTANEOUS
  Filled 2023-10-15 (×7): qty 0.4

## 2023-10-15 MED ORDER — SODIUM CHLORIDE 0.9 % IV BOLUS
1000.0000 mL | Freq: Once | INTRAVENOUS | Status: AC
Start: 2023-10-15 — End: 2023-10-15
  Administered 2023-10-15: 1000 mL via INTRAVENOUS

## 2023-10-15 MED ORDER — MIDODRINE HCL 5 MG PO TABS
10.0000 mg | ORAL_TABLET | Freq: Three times a day (TID) | ORAL | Status: DC
  Administered 2023-10-15 – 2023-10-16 (×2): 10 mg via ORAL
  Filled 2023-10-15 (×2): qty 2

## 2023-10-15 MED ORDER — STROKE: EARLY STAGES OF RECOVERY BOOK: Freq: Once | Status: DC

## 2023-10-15 MED ORDER — ATORVASTATIN CALCIUM 20 MG PO TABS
40.0000 mg | ORAL_TABLET | Freq: Every day | ORAL | Status: DC
  Administered 2023-10-15 – 2023-10-21 (×7): 40 mg via ORAL
  Filled 2023-10-15 (×7): qty 2

## 2023-10-15 MED ORDER — MAGNESIUM SULFATE 2 GM/50ML IV SOLN
2.0000 g | Freq: Once | INTRAVENOUS | Status: AC
  Administered 2023-10-15: 2 g via INTRAVENOUS
  Filled 2023-10-15: qty 50

## 2023-10-15 NOTE — Consult Note (Signed)
 PHARMACY CONSULT NOTE - ELECTROLYTES  Pharmacy Consult for Electrolyte Monitoring and Replacement   Recent Labs: Potassium (mmol/L)  Date Value  10/15/2023 4.0   Magnesium  (mg/dL)  Date Value  09/81/1914 1.8   Calcium  (mg/dL)  Date Value  78/29/5621 7.7 (L)   Albumin  (g/dL)  Date Value  30/86/5784 2.6 (L)   Phosphorus (mg/dL)  Date Value  69/62/9528 2.9   Sodium (mmol/L)  Date Value  10/15/2023 138   Height: 5\' 2"  (157.5 cm) Weight: 76.2 kg (167 lb 14.4 oz) IBW/kg (Calculated) : 50.1 Estimated Creatinine Clearance: 34.7 mL/min (A) (by C-G formula based on SCr of 1.05 mg/dL (H)).  Assessment  Lauren Morris is a 89 y.o. female presenting with septic shock. PMH significant for recurrent Klebsiella UTI, type 2 diabetes, Graves' disease now with acquired hypothyroidism, hypertension, hyperlipidemia, OSA on CPAP, recurrent bronchitis, meningioma . Pharmacy has been consulted to monitor and replace electrolytes.  Diet: Dysphagia 1 MIVF: LR @ 75 mL/hr Pertinent medications: N/A  Goal of Therapy: Electrolytes within normal limits  Plan:  Mg 1.8, magnesium  sulfate 2 g IV x 1 Follow-up electrolytes with AM labs tomorrow  Thank you for allowing pharmacy to be a part of this patient's care.  Page Boast 10/15/2023 8:41 AM

## 2023-10-15 NOTE — ED Notes (Signed)
 Pt appears more labored than before with breathing. Using neck muscles. 97% on 2L. Neck and upper chest sound clear. Lower lobes are diminished. Inhaler arrived from pharmacy. Will give inhaler and duoneb. Hospitalist messaged to inform of status change. Also temp has come up to normal, bear hugger removed per request.

## 2023-10-15 NOTE — Consult Note (Signed)
 [facility] SURGICAL CONSULTATION NOTE (initial) - cpt: 96045   HISTORY OF PRESENT ILLNESS (HPI):  89 y.o. female presented to [facility] ED yesterday secondary to AMS. On chart review, patient's daughter reports that the patient spent a majority of the day in her recliner without eating which is quite abnormal for her. Unable to perform any ADLs that day. History beyond that is not clear. Otherwise, she denied any abdominal pain, nausea, emesis, fever, chills, CP, SOB. Previous abdominal surgeries positive for abdominal hysterectomy. According to daughter prior to this presentation patient was independent and able to perform ADLs. Work up in the ED revealed a leukocytosis to 11.8K (now 16.7K this AM), Hgb to 14.9, AKI with sCr 0 1.35 (now 1.05), hypokalemia to 3.2 (resolved), venous lactate 3.2. She did have CT Head concerning for acute right basal gangliar infarct. Follow up MRI confirmed this.   Additionally, she was found to have elevation in LFTs and bilirubin to 1.9, which is chronic in nature. She did however undergo RUQ US  which showed cholelithiasis and wall thickening. Also noted to have changes consistent with cirrhosis and known history of NAFLD. Currently on Rocephin , Flagyl . NPO this morning.   Surgery is consulted by hospitalist physician Dr. Deena Farrier, MD in this context for evaluation and management of possible cholecystitis.  PAST MEDICAL HISTORY (PMH):  Past Medical History:  Diagnosis Date   Community acquired pneumonia 04/05/2016   Cyst    hx o on right breast   DJD (degenerative joint disease)    right knee   Glaucoma    Hypertension    Pneumonia due to COVID-19 virus 12/12/2020   Sleep apnea    Thyroid  disease      PAST SURGICAL HISTORY (PSH):  Past Surgical History:  Procedure Laterality Date   ABDOMINAL HYSTERECTOMY     VITRECTOMY  July 2012   right eye with membrane peel Marlane Silver, [place])     MEDICATIONS:  Prior to Admission medications    Medication Sig Start Date End Date Taking? Authorizing Provider  Ascorbic Acid  (VITAMIN C ) 1000 MG tablet Take 1 tablet (1,000 mg total) by mouth daily. 09/04/21  Yes Thersia Flax, MD  Aspirin  81 MG CAPS Take 1 capsule by mouth daily at 6 (six) AM.   Yes [provider]  atenolol  (TENORMIN ) 25 MG tablet Take 1 tablet (25 mg total) by mouth 2 (two) times daily. 09/10/23  Yes Thersia Flax, MD  Calcium  Carb-Cholecalciferol (CALCIUM  1000 + D PO) Take 1 tablet by mouth daily.   Yes [provider]  famotidine  (PEPCID ) 20 MG tablet Take 1 tablet (20 mg total) by mouth 2 (two) times daily. 09/10/23  Yes Thersia Flax, MD  fish oil-omega-3 fatty acids  1000 MG capsule Take 2 capsules (2 g total) by mouth daily. 09/04/21  Yes Thersia Flax, MD  guaiFENesin  (MUCINEX ) 600 MG 12 hr tablet Take 1 tablet (600 mg total) by mouth 2 (two) times daily. 10/13/22  Yes Sreenath, Sudheer B, MD  Multiple Vitamin (MULTIVITAMIN WITH MINERALS) TABS tablet Take 1 tablet by mouth daily. 11/27/20  Yes Regalado, Belkys A, MD  prednisoLONE  acetate (PREDNISOLONE  ACETATE P-F) 1 % ophthalmic suspension Place 2 drops into the left eye 4 (four) times daily.   Yes [provider]  RESTASIS  0.05 % ophthalmic emulsion Place 1 drop into both eyes 2 (two) times daily. 10/09/22  Yes [provider]  telmisartan  (MICARDIS ) 40 MG tablet Take 40 mg by mouth daily.   Yes  [provider]  timolol  (TIMOPTIC ) 0.5 % ophthalmic solution SMARTSIG:In Eye(s) 08/06/23  Yes [provider]  umeclidinium-vilanterol (ANORO ELLIPTA ) 62.5-25 MCG/ACT AEPB Inhale 1 puff into the lungs daily. 11/26/22  Yes Thersia Flax, MD  valACYclovir  (VALTREX ) 500 MG tablet Take 500 mg by mouth at bedtime. 09/17/22  Yes [provider]  albuterol  (PROVENTIL ) (2.5 MG/3ML) 0.083% nebulizer solution Take 3 mLs (2.5 mg total) by nebulization every 6 (six) hours as needed for wheezing or shortness of breath. Patient  not taking: Reported on 10/14/2023 10/13/22   Tiajuana Fluke, MD  amoxicillin -clavulanate (AUGMENTIN ) 875-125 MG tablet Take 1 tablet by mouth 2 (two) times daily. Patient not taking: Reported on 10/14/2023 09/05/23   Narendra, Nischal, MD  Difluprednate 0.05 % EMUL Place 1 drop into the left eye 2 (two) times daily. 02/07/23   [provider]  dorzolamide -timolol  (COSOPT ) 22.3-6.8 MG/ML ophthalmic solution Place 1 drop into both eyes 2 (two) times daily. Patient not taking: Reported on 10/14/2023    [provider]  melatonin 5 MG TABS Take 1 tablet (5 mg total) by mouth at bedtime as needed. Patient not taking: Reported on 09/10/2023 10/13/22   Tiajuana Fluke, MD  telmisartan  (MICARDIS ) 80 MG tablet Take 1 tablet (80 mg total) by mouth at bedtime. Patient not taking: Reported on 10/14/2023 09/12/23   Tullo, Teresa L, MD  Zinc  Oxide (TRIPLE PASTE) 12.8 % ointment Apply 1 Application topically. Every shift    [provider]  zinc  sulfate 220 (50 Zn) MG capsule Take 1 capsule (220 mg total) by mouth daily. 09/04/21   Thersia Flax, MD     ALLERGIES:  No Known Allergies   SOCIAL HISTORY:  Social History   Socioeconomic History   Marital status: Widowed    Spouse name: Not on file   Number of children: Not on file   Years of education: Not on file   Highest education level: Not on file  Occupational History   Not on file  Tobacco Use   Smoking status: Never   Smokeless tobacco: Never  Vaping Use   Vaping status: Never Used  Substance and Sexual Activity   Alcohol  use: No   Drug use: No   Sexual activity: Not Currently  Other Topics Concern   Not on file  Social History Narrative   Not on file   Social Drivers of Health   Financial Resource Strain: Not on file  Food Insecurity: No Food Insecurity (10/10/2022)   Hunger Vital Sign    Worried About Running Out of Food in the Last Year: Never true    Ran Out of Food in the Last Year: Never true   Transportation Needs: No Transportation Needs (10/10/2022)   PRAPARE - Administrator, Civil Service (Medical): No    Lack of Transportation (Non-Medical): No  Physical Activity: Not on file  Stress: Not on file  Social Connections: Not on file  Intimate Partner Violence: Not At Risk (10/10/2022)   Humiliation, Afraid, Rape, and Kick questionnaire    Fear of Current or Ex-Partner: No    Emotionally Abused: No    Physically Abused: No    Sexually Abused: No     FAMILY HISTORY:  Family History  Problem Relation Age of Onset   Cancer Father    Hypertension Father    Heart attack Mother    Hypertension Mother       REVIEW OF SYSTEMS:  Review of Systems  Constitutional:  Negative for chills and fever.  Respiratory:  Negative for cough and shortness of breath.   Cardiovascular:  Negative for chest pain and palpitations.  Gastrointestinal:  Negative for abdominal pain, constipation, diarrhea, nausea and vomiting.  Genitourinary:  Negative for dysuria and urgency.  Neurological:  Negative for focal weakness and headaches.       + AMS  All other systems reviewed and are negative.   VITAL SIGNS:  Temp:  [94.9 F (34.9 C)-99.7 F (37.6 C)] 94.9 F (34.9 C) (04/22 1020) Pulse Rate:  [70-92] 84 (04/22 1200) Resp:  [14-39] 22 (04/22 0900) BP: (81-125)/(39-66) 111/52 (04/22 1200) SpO2:  [84 %-100 %] 97 % (04/22 1200) Weight:  [76.2 kg] 76.2 kg (04/21 1940)     Height: 5\' 2"  (157.5 cm) Weight: 76.2 kg BMI (Calculated): 30.7   INTAKE/OUTPUT:  No intake/output data recorded.  PHYSICAL EXAM:  Physical Exam Vitals and nursing note reviewed. Exam conducted with a chaperone present.  Constitutional:      General: She is not in acute distress.    Appearance: Normal appearance.     Comments: Resting in bed; alert/awake, NAD. Daughter at bedside   HENT:     Head: Normocephalic and atraumatic.  Eyes:     General: Scleral icterus (Mildly icteric) present.     Pupils:  Pupils are equal, round, and reactive to light.  Cardiovascular:     Rate and Rhythm: Normal rate.     Pulses: Normal pulses.  Pulmonary:     Effort: Pulmonary effort is normal. No respiratory distress.     Comments: On [place] Abdominal:     General: Abdomen is flat. There is no distension.     Palpations: Abdomen is soft.     Tenderness: There is no abdominal tenderness. There is no guarding or rebound. Negative signs include Murphy's sign.     Comments: Abdomen is soft, non-tender, non-distended, no rebound/guarding.   Genitourinary:    Comments: Deferred Skin:    General: Skin is warm and dry.     Coloration: Skin is not jaundiced.  Neurological:     Mental Status: She is alert.  Psychiatric:        Mood and Affect: Mood normal.        Behavior: Behavior normal.      Labs:     Latest Ref Rng & Units 10/15/2023    4:25 AM 10/14/2023    7:46 PM 11/08/2022    6:35 PM  CBC  WBC 4.0 - 10.5 K/uL 16.7  11.8  7.9   Hemoglobin 12.0 - 15.0 g/dL 16.1  09.6  04.5   Hematocrit 36.0 - 46.0 % 41.1  45.5  43.4   Platelets 150 - 400 K/uL 119  152  169       Latest Ref Rng & Units 10/15/2023    4:25 AM 10/14/2023    7:46 PM 09/10/2023   11:00 AM  CMP  Glucose 70 - 99 mg/dL 409  811  914   BUN 8 - 23 mg/dL 24  24  13    Creatinine 0.44 - 1.00 mg/dL 7.82  9.56  2.13   Sodium 135 - 145 mmol/L 138  140  136   Potassium 3.5 - 5.1 mmol/L 4.0  3.2  4.4   Chloride 98 - 111 mmol/L 112  108  101   CO2 22 - 32 mmol/L 18  21  26    Calcium  8.9 - 10.3 mg/dL 7.7  8.6  9.7   Total  Protein 6.5 - 8.1 g/dL 6.1  6.6  7.5   Total Bilirubin 0.0 - 1.2 mg/dL 1.6  1.9  1.4   Alkaline Phos 38 - 126 U/L 248  328  451   AST 15 - 41 U/L 76  97  79   ALT 0 - 44 U/L 60  69  62     Imaging studies:   RUQ US  (10/14/2023) personally reviewed with thickening of gallbladder and mild pericholecystic fluid, and radiologist report reviewed below:  IMPRESSION: 1. Cholelithiasis with findings suggestive of acute  cholecystitis. 2. Cirrhosis. No focal liver lesions identified. Please note that liver protocol enhanced MR and CT are the most sensitive tests for the screening detection of hepatocellular carcinoma in the high risk setting of cirrhosis. 3. Trace right pleural effusion.   Assessment/Plan: (ICD-10's: K63.20) 89 y.o. female presenting with AMS found to have acute right basal ganglia CVA also with cholelithiasis and NAFLD with chronic LFT elevation, complicated by advanced age, comorbid conditions   - From abdominal perspective. Her examination is benign and she has denied any post-prandial pain previously. She has a known history of NAFLD and LFT changes which appear to be stable over the last 2-3 years on chart review. Noted changes on US  could be sequela of her liver disease. Suspicion for acute cholecystitis at this time is low. She does have a leukocytosis but abdomen again is benign. Regardless, I do not think she is a candidate for cholecystectomy in this setting. I did review role for percutaneous cholecystectomy in this setting if she truly become symptomatic or worsens, but she declines this stating "not at my age," which is reasonable. For now, I do think it is reasonable to continue Abx and monitor response. She understands that if she worsens clinically, develops pain, fever, worsening leukocytosis, we may need to revisit these options.  - Again, LFTs appear to be relatively stable over the last 2-3 years. I do not feel strongly she warrants additional imaging (ie: MRCP) at this time. Continue to monitor.  - Okay for diet from surgical perspective as long as cleared from other services as well  - Monitor abdominal examination   - Monitor leukocytosis   - Further management per primary service; we will be happy to follow along  All of the above findings and recommendations were discussed with the patient and her family (daughter at bedside), and all of their questions were answered to  their expressed satisfaction.  Thank you for the opportunity to participate in this patient's care.   -- Apolonio Bay, PA-C [facility] 10/15/2023, 12:23 PM M-F: 7am - 4pm

## 2023-10-15 NOTE — ED Notes (Signed)
 Neurologist at bedside.

## 2023-10-15 NOTE — ED Notes (Signed)
 Messaged pharmacy re: 10am meds coming from pharmacy (send and/or retime).

## 2023-10-15 NOTE — Progress Notes (Addendum)
 PROGRESS NOTE   HPI was taken from Dr. Guss Legacy: Lauren Morris is a 89 y.o. female with medical history significant of recurrent Klebsiella UTI, type 2 diabetes, Graves' disease now with acquired hypothyroidism, hypertension, hyperlipidemia, OSA on CPAP, recurrent bronchitis, meningioma, who presents to the ED due to altered mental status.   History obtained from both Lauren Morris and her daughter at bedside.  Lauren Morris states that over the last 1 day, she has noticed increasing difficulty with walking, and this usually happens when she has an underlying UTI.  Her daughter at bedside states that yesterday, Lauren Morris slept all day, which is unusual for her.  Then she checked on her today at 8 AM, and she seemed close to her baseline.  When she checked on her again at approximately 5 or 6 PM, Lauren Morris was laying in her recliner, and still in her pajamas.  She has not gotten up or eaten all day.  She states that this is markedly unusual as Lauren Morris usually sticks to a schedule.  She seemed confused at the time but was still able to answer questions appropriately.  Lauren Morris denies any nausea, vomiting, chest pain, shortness of breath, palpitations, lower extremity swelling, cough, rhinorrhea, sinus congestion.  She denies any urinary symptoms at this time other than frequency, which she states is chronic.   ED course: On arrival to the ED, patient was hypotensive at 84/66 with heart rate of 87.  She was saturating at 92% on room air.  She was afebrile at 99.7.  She was tachypneic at 24/min.  Initial workup notable for WBC of 11.8, potassium 3.2, bicarb 21, creatinine 1.35, BUN 24, alkaline phosphatase 328, AST 97, ALT 69, total bilirubin 1.9, GFR 38.  Lactic acid 3.2.  Influenza, RSV and COVID-19 negative.  Chest x-ray and CT head obtained with results pending.  Patient given 2 L of IV fluids, Zosyn  and vancomycin .  TRH contacted for admission.    Lauren Morris  UEA:[id number] DOB: 12/26/1931 DOA:  10/14/2023 PCP: Thersia Flax, MD    Assessment & Plan:   Principal Problem:   Septic shock [facility]) Active Problems:   UTI (urinary tract infection)   AKI (acute kidney injury) (HCC)   Essential hypertension   Acquired hypothyroidism   Type 2 diabetes mellitus (HCC)   Fatty liver disease, nonalcoholic   COPD (chronic obstructive pulmonary disease) (HCC)   OSA (obstructive sleep apnea)  Assessment and Plan: Septic shock: met criteria w/ AMS, hypotension, tachypnea, leukocytosis, elevated lactic acid & questionable acute cholecystitis vs unlikely UTI. Continue on IV rocephin  and start IV flagyl .   CVA: MRI showed right basal ganglia infarct, no hemorrhage or mass effect. Echo ordered. Continue w/ neuro checks. Neuro consulted   Questionable cholecystitis: as per US  but no abd pain, nausea or vomiting. Likely a poor surgical candidate & pt does not want a percutaneous drain currently. Continue on IV rocephin  and start IV flagyl . Gen surg following and recs apprec  Unlikely UTI: UA is positive for rare bacteria only.    AKI: likely secondary to sepsis   OSA: CPAP qhs   Respiratory distress/dyspnea: previous suspicion for COPD but PFTs have been unremarkable as per admitting physician. Bronchodilators prn. Repeat CXR ordered   Fatty liver disease: nonalcoholic. Hx of fatty liver disease. US  shows cirrhosis & likely acute cholecystitis   Transaminitis: likely secondary to NAFLD & cirrhosis. Will continue to monitor   DM2: well controlled, HbA1c 5.9. No need for SSI  currently    Hypothyroidism: continue on home dose of levothyroxine    HTN: holding all home anti-HTN meds secondary to hypotension       DVT prophylaxis: lovenox   Code Status: DNR Family Communication: discussed pt's care w/ pt's daughter at bedside and answered her questions  Disposition Plan: depends on PT/OT recs  Level of care: Progressive  Status is: Inpatient Remains inpatient appropriate because:  severity of illness    Consultants:  Neuro Gen surg   Procedures:   Antimicrobials: rocephin , flagyl    Subjective: Pt c/o malaise   Objective: Vitals:   10/15/23 0700 10/15/23 0745 10/15/23 0800 10/15/23 0830  BP: (!) 93/50  (!) 115/58 125/61  Pulse: 72  75 78  Resp: (!) 24  (!) 22 (!) 24  Temp:  (!) 95.9 F (35.5 C)    TempSrc:  Axillary    SpO2: 94%  99% 98%  Weight:      Height:        Intake/Output Summary (Last 24 hours) at 10/15/2023 0858 Last data filed at 10/15/2023 0744 Gross per 24 hour  Intake --  Output 150 ml  Net -150 ml   Filed Weights   10/14/23 1940  Weight: 76.2 kg    Examination:  General exam: Appears calm and comfortable  Respiratory system: decreased breath sounds b/l  Cardiovascular system: S1 & S2+. No rubs, gallops or clicks.  Gastrointestinal system: Abdomen is obese, soft and nontender. Normal bowel sounds heard. Central nervous system: Alert and awake. Moves all extremities  Psychiatry: Judgement and insight appears at baseline. Flat mood and affec     Data Reviewed: I have personally reviewed following labs and imaging studies  CBC: Recent Labs  Lab 10/14/23 1946 10/15/23 0425  WBC 11.8* 16.7*  NEUTROABS 10.0*  --   HGB 14.9 13.2  HCT 45.5 41.1  MCV 96.6 98.8  PLT 152 119*   Basic Metabolic Panel: Recent Labs  Lab 10/14/23 1946 10/15/23 0425  NA 140 138  K 3.2* 4.0  CL 108 112*  CO2 21* 18*  GLUCOSE 144* 166*  BUN 24* 24*  CREATININE 1.35* 1.05*  CALCIUM  8.6* 7.7*  MG  --  1.8   GFR: Estimated Creatinine Clearance: 34.7 mL/min (A) (by C-G formula based on SCr of 1.05 mg/dL (H)). Liver Function Tests: Recent Labs  Lab 10/14/23 1946 10/15/23 0425  AST 97* 76*  ALT 69* 60*  ALKPHOS 328* 248*  BILITOT 1.9* 1.6*  PROT 6.6 6.1*  ALBUMIN  3.0* 2.6*   No results for input(s): "LIPASE", "AMYLASE" in the last 168 hours. No results for input(s): "AMMONIA" in the last 168 hours. Coagulation  Profile: Recent Labs  Lab 10/14/23 1946  INR 1.2   Cardiac Enzymes: No results for input(s): "CKTOTAL", "CKMB", "CKMBINDEX", "TROPONINI" in the last 168 hours. BNP (last 3 results) No results for input(s): "PROBNP" in the last 8760 hours. HbA1C: No results for input(s): "HGBA1C" in the last 72 hours. CBG: No results for input(s): "GLUCAP" in the last 168 hours. Lipid Profile: No results for input(s): "CHOL", "HDL", "LDLCALC", "TRIG", "CHOLHDL", "LDLDIRECT" in the last 72 hours. Thyroid  Function Tests: No results for input(s): "TSH", "T4TOTAL", "FREET4", "T3FREE", "THYROIDAB" in the last 72 hours. Anemia Panel: No results for input(s): "VITAMINB12", "FOLATE", "FERRITIN", "TIBC", "IRON", "RETICCTPCT" in the last 72 hours. Sepsis Labs: Recent Labs  Lab 10/14/23 1950 10/14/23 2126  LATICACIDVEN 3.2* 2.5*    Recent Results (from the past 240 hours)  Blood Culture (routine x 2)  Status: None (Preliminary result)   Collection Time: 10/14/23  7:42 PM   Specimen: BLOOD  Result Value Ref Range Status   Specimen Description BLOOD BLOOD RIGHT ARM  Final   Special Requests   Final    BOTTLES DRAWN AEROBIC AND ANAEROBIC Blood Culture adequate volume   Culture   Final    NO GROWTH < 12 HOURS Performed at [facility], [address] [place], [place] [address]    Report Status PENDING  Incomplete  Blood Culture (routine x 2)     Status: None (Preliminary result)   Collection Time: 10/14/23  7:50 PM   Specimen: BLOOD  Result Value Ref Range Status   Specimen Description BLOOD BLOOD LEFT FOREARM  Final   Special Requests   Final    BOTTLES DRAWN AEROBIC AND ANAEROBIC Blood Culture adequate volume   Culture   Final    NO GROWTH < 12 HOURS Performed at [facility], [address], [place], [place] [address]    Report Status PENDING  Incomplete  Resp panel by RT-PCR (RSV, Flu A&B, Covid) Anterior Nasal Swab     Status: None   Collection Time: 10/14/23  8:01  PM   Specimen: Anterior Nasal Swab  Result Value Ref Range Status   SARS Coronavirus 2 by RT PCR NEGATIVE NEGATIVE Final    Comment: (NOTE) SARS-CoV-2 target nucleic acids are NOT DETECTED.  The SARS-CoV-2 RNA is generally detectable in upper respiratory specimens during the acute phase of infection. The lowest concentration of SARS-CoV-2 viral copies this assay can detect is 138 copies/mL. A negative result does not preclude SARS-Cov-2 infection and should not be used as the sole basis for treatment or other patient management decisions. A negative result may occur with  improper specimen collection/handling, submission of specimen other than nasopharyngeal swab, presence of viral mutation(s) within the areas targeted by this assay, and inadequate number of viral copies(<138 copies/mL). A negative result must be combined with clinical observations, patient history, and epidemiological information. The expected result is Negative.  Fact Sheet for Patients:  BloggerCourse.com  Fact Sheet for Healthcare Providers:  SeriousBroker.it  This test is no t yet approved or cleared by the [place]  FDA and  has been authorized for detection and/or diagnosis of SARS-CoV-2 by FDA under an Emergency Use Authorization (EUA). This EUA will remain  in effect (meaning this test can be used) for the duration of the COVID-19 declaration under Section 564(b)(1) of the Act, 21 U.S.C.section 360bbb-3(b)(1), unless the authorization is terminated  or revoked sooner.       Influenza A by PCR NEGATIVE NEGATIVE Final   Influenza B by PCR NEGATIVE NEGATIVE Final    Comment: (NOTE) The Xpert Xpress SARS-CoV-2/FLU/RSV plus assay is intended as an aid in the diagnosis of influenza from Nasopharyngeal swab specimens and should not be used as a sole basis for treatment. Nasal washings and aspirates are unacceptable for Xpert Xpress  SARS-CoV-2/FLU/RSV testing.  Fact Sheet for Patients: BloggerCourse.com  Fact Sheet for Healthcare Providers: SeriousBroker.it  This test is not yet approved or cleared by the [place]  FDA and has been authorized for detection and/or diagnosis of SARS-CoV-2 by FDA under an Emergency Use Authorization (EUA). This EUA will remain in effect (meaning this test can be used) for the duration of the COVID-19 declaration under Section 564(b)(1) of the Act, 21 U.S.C. section 360bbb-3(b)(1), unless the authorization is terminated or revoked.     Resp Syncytial Virus by PCR NEGATIVE NEGATIVE  Final    Comment: (NOTE) Fact Sheet for Patients: BloggerCourse.com  Fact Sheet for Healthcare Providers: SeriousBroker.it  This test is not yet approved or cleared by the [place]  FDA and has been authorized for detection and/or diagnosis of SARS-CoV-2 by FDA under an Emergency Use Authorization (EUA). This EUA will remain in effect (meaning this test can be used) for the duration of the COVID-19 declaration under Section 564(b)(1) of the Act, 21 U.S.C. section 360bbb-3(b)(1), unless the authorization is terminated or revoked.  Performed at [facility], [address] [place], [place] [address]          Radiology Studies: MR BRAIN WO CONTRAST Result Date: 10/15/2023 CLINICAL DATA:  Altered mental status EXAM: MRI HEAD WITHOUT CONTRAST TECHNIQUE: Multiplanar, multiecho pulse sequences of the brain and surrounding structures were obtained without intravenous contrast. COMPARISON:  08/02/2021 FINDINGS: Brain: Right basal ganglia acute infarct. No acute or chronic hemorrhage. There is multifocal hyperintense T2-weighted signal within the white matter. Parenchymal volume and CSF spaces are normal. The midline structures are normal. Unchanged right posterior fossa of meningioma.  Vascular: Normal flow voids. Skull and upper cervical spine: Normal calvarium and skull base. Visualized upper cervical spine and soft tissues are normal. Sinuses/Orbits:No paranasal sinus fluid levels or advanced mucosal thickening. No mastoid or middle ear effusion. Normal orbits. IMPRESSION: 1. Right basal ganglia acute infarct. No hemorrhage or mass effect. 2. Unchanged right posterior fossa meningioma. Electronically Signed   By: Juanetta Nordmann M.D.   On: 10/15/2023 03:30   CT HEAD WO CONTRAST ( ) Addendum Date: 10/14/2023 ADDENDUM REPORT: 10/14/2023 23:41 ADDENDUM: These results were called by telephone at the time of interpretation on 10/14/2023 at 11:41 pm to provider MICHAEL MIAN , who verbally acknowledged these results. Electronically Signed   By: Morgane  Naveau M.D.   On: 10/14/2023 23:41   Result Date: 10/14/2023 CLINICAL DATA:  altered mental status EXAM: CT HEAD WITHOUT CONTRAST TECHNIQUE: Contiguous axial images were obtained from the base of the skull through the vertex without intravenous contrast. RADIATION DOSE REDUCTION: This exam was performed according to the departmental dose-optimization program which includes automated exposure control, adjustment of the mA and/or kV according to patient size and/or use of iterative reconstruction technique. COMPARISON:  CT head 11/12/2021 FINDINGS: Brain: Trace patchy and confluent areas of decreased attenuation are noted throughout the deep and periventricular white matter of the cerebral hemispheres bilaterally, compatible with chronic microvascular ischemic disease. Interval development of right basal ganglia hypodensity with loss of gray-white matter differentiation and associated edema. No parenchymal hemorrhage. No mass lesion. No extra-axial collection. No midline shift. No hydrocephalus. Basilar cisterns are patent. Vascular: No hyperdense vessel. Atherosclerotic calcifications are present within the cavernous internal carotid arteries.  Skull: No acute fracture or focal lesion. Sinuses/Orbits: Paranasal sinuses and mastoid air cells are clear. Bilateral lens replacement. Otherwise the orbits are unremarkable. Other: None. IMPRESSION: 1. Acute to early subacute right basal ganglia infarction. Recommend MRI head noncontrast for further evaluation. 2. No intracranial hemorrhage. Electronically Signed: By: Morgane  Naveau M.D. On: 10/14/2023 23:32   US  ABDOMEN LIMITED RUQ (LIVER/GB) Result Date: 10/14/2023 CLINICAL DATA:  [id number] Transaminitis [id number] EXAM: ULTRASOUND ABDOMEN LIMITED RIGHT UPPER QUADRANT COMPARISON:  CT abdomen pelvis 08/02/2021 FINDINGS: Gallbladder: Cholelithiasis with associated gallbladder wall thickening and pericholecystic fluid. No sonographic Murphy sign noted by sonographer. Common bile duct: Diameter: 5 mm Liver: Nodular contour. No focal lesion identified. Within normal limits in parenchymal echogenicity. Portal vein is patent on color Doppler imaging with normal  direction of blood flow towards the liver. Other: Trace right pleural effusion. IMPRESSION: 1. Cholelithiasis with findings suggestive of acute cholecystitis. 2. Cirrhosis. No focal liver lesions identified. Please note that liver protocol enhanced MR and CT are the most sensitive tests for the screening detection of hepatocellular carcinoma in the high risk setting of cirrhosis. 3. Trace right pleural effusion. Electronically Signed   By: Morgane  Naveau M.D.   On: 10/14/2023 23:27   DG Chest Port 1 View Result Date: 10/14/2023 CLINICAL DATA:  Questionable sepsis - evaluate for abnormality EXAM: PORTABLE CHEST 1 VIEW COMPARISON:  Chest x-ray 11/08/2022, CT chest 02/08/2023 FINDINGS: The heart and mediastinal contours are unchanged. Atherosclerotic plaque. No focal consolidation. No pulmonary edema. Interval development of at least small left pleural effusion. Pleural effusion. No pneumothorax. No acute osseous abnormality. IMPRESSION: 1. Interval development of  at least small left pleural effusion. Followup PA and lateral chest X-ray is recommended in 3-4 weeks following therapy to ensure resolution and exclude underlying malignancy. 2.  Aortic Atherosclerosis (ICD10-I70.0). Electronically Signed   By: Morgane  Naveau M.D.   On: 10/14/2023 23:25        Scheduled Meds:  aspirin  EC  81 mg Oral Q0600   cycloSPORINE   1 drop Both Eyes BID   enoxaparin  (LOVENOX ) injection  30 mg Subcutaneous Q24H   famotidine   20 mg Oral BID   prednisoLONE  acetate  2 drop Left Eye QID   sodium chloride  flush  3 mL Intravenous Q12H   timolol   1 drop Both Eyes Daily   umeclidinium-vilanterol  1 puff Inhalation Daily   valACYclovir   500 mg Oral QHS   Continuous Infusions:  cefTRIAXone  (ROCEPHIN )  IV Stopped (10/15/23 0500)   lactated ringers  75 mL/hr at 10/15/23 0844   magnesium  sulfate bolus IVPB       LOS: 1 day       Alphonsus Jeans, MD [facility] Pager [phone]  If 7PM-7AM, please contact night-coverage [email or web address] 10/15/2023, 8:58 AM

## 2023-10-15 NOTE — ED Notes (Signed)
 Rectal temp still low. Changed brief, did peri care. Back on bear hugger. Dr Broadus Canes made aware of temp.

## 2023-10-15 NOTE — ED Notes (Signed)
Dietary called for tray

## 2023-10-15 NOTE — ED Notes (Signed)
 Sent med message to pharmacy for 8am inhaler.

## 2023-10-15 NOTE — ED Notes (Signed)
 Gave midodrine  (new order for BP), last BP 86/36, hospitalist informed.

## 2023-10-15 NOTE — Progress Notes (Signed)
 PT Cancellation Note  Patient Details Name: Lauren Morris MRN: [id number] DOB: 10-Sep-1931   Cancelled Treatment:       Reason PT Eval/Treat Not Completed: Medical issues which prohibited therapy;Patient not medically ready. Consult received, chart reviewed. Secure chat with RN. Pt still on bear hugger. Inappropriate for PT at this time. Will re-attempt at later date/time as pt is appropriate.    [name] 10/15/2023, 9:35 AM

## 2023-10-15 NOTE — ED Notes (Signed)
 Provided juice, water, applesauce.

## 2023-10-15 NOTE — ED Notes (Signed)
 Placed on bear hugger. Will recheck temp rectally after has been on bear hugger for enough time.

## 2023-10-15 NOTE — ED Notes (Signed)
 Provided applesauce and water. Pt has breakfast tray. Daughter at bedside.

## 2023-10-15 NOTE — ED Notes (Signed)
 This RN gave report to [employer] and performed care handoff. Call light in reach, bed wheels locked, side rail raised, pt updated on plan of care. Rounding completed. High fall risk precautions in place.

## 2023-10-15 NOTE — Consult Note (Signed)
 NEUROLOGY CONSULT NOTE   Date of service: October 15, 2023 Patient Name: Lauren Morris MRN:  [id number] DOB:  [birth year] Chief Complaint: "Difficulty walking" Requesting Provider: Alphonsus Jeans, MD  History of Present Illness  Lauren Morris is a 89 y.o. female with hx of hypertension after presenting following difficulty getting up from a chair.  She got up and was doing her normal activities yesterday, but then sat back down in a lazy boy, and then felt very tired and did not get up to move around much.  When her daughter tried to get her up later, they were unable to and had to call 911.  On arrival, she was found to be hypotensive and she was treated for sepsis.  She has been found to have cholecystitis and surgery has been consulted.  As part of her workup, an MRI of the brain was obtained which demonstrates a small to moderate-sized subcortical stroke on the right  LKW: 4/21 8 AM IV Thrombolysis: No, outside of window EVT: No, no LVO  NIHSS components Score: Comment  1a Level of Conscious 0[x]  1[]  2[]  3[]      1b LOC Questions 0[x]  1[]  2[]       1c LOC Commands 0[x]  1[]  2[]       2 Best Gaze 0[x]  1[]  2[]       3 Visual 0[x]  1[]  2[]  3[]      4 Facial Palsy 0[x]  1[]  2[]  3[]      5a Motor Arm - left 0[x]  1[]  2[]  3[]  4[]  UN[]    5b Motor Arm - Right 0[x]  1[]  2[]  3[]  4[]  UN[]    6a Motor Leg - Left 0[x]  1[]  2[]  3[]  4[]  UN[]    6b Motor Leg - Right 0[x]  1[]  2[]  3[]  4[]  UN[]    7 Limb Ataxia 0[x]  1[]  2[]  3[]  UN[]     8 Sensory 0[x]  1[]  2[]  UN[]      9 Best Language 0[x]  1[]  2[]  3[]      10 Dysarthria 0[x]  1[]  2[]  UN[]      11 Extinct. and Inattention 0[x]  1[]  2[]       TOTAL: 0      Past History   Past Medical History:  Diagnosis Date   Community acquired pneumonia 04/05/2016   Cyst    hx o on right breast   DJD (degenerative joint disease)    right knee   Glaucoma    Hypertension    Pneumonia due to COVID-19 virus 12/12/2020   Sleep apnea    Thyroid  disease     Past Surgical  History:  Procedure Laterality Date   ABDOMINAL HYSTERECTOMY     VITRECTOMY  July 2012   right eye with membrane peel Marlane Silver, [place])    Family History: Family History  Problem Relation Age of Onset   Cancer Father    Hypertension Father    Heart attack Mother    Hypertension Mother     Social History  reports that she has never smoked. She has never used smokeless tobacco. She reports that she does not drink alcohol  and does not use drugs.  No Known Allergies  Medications   Current Facility-Administered Medications:     stroke: early stages of recovery book, , Does not apply, Once, Augustin Leber, MD   acetaminophen  (TYLENOL ) tablet 650 mg, 650 mg, Oral, Q6H PRN **OR** acetaminophen  (TYLENOL ) suppository 650 mg, 650 mg, Rectal, Q6H PRN, Avi Body, MD   aspirin  EC tablet 81 mg, 81 mg, Oral, Q0600, Avi Body, MD, 81 mg at 10/15/23 0015  cefTRIAXone  (ROCEPHIN ) 2 g in sodium chloride  0.9 % 100 mL IVPB, 2 g, Intravenous, Q24H, Avi Body, MD, Stopped at 10/15/23 0500   cycloSPORINE  (RESTASIS ) 0.05 % ophthalmic emulsion 1 drop, 1 drop, Both Eyes, BID, Avi Body, MD   enoxaparin  (LOVENOX ) injection 30 mg, 30 mg, Subcutaneous, Q24H, Basaraba, Iulia, MD, 30 mg at 10/15/23 0015   famotidine  (PEPCID ) tablet 20 mg, 20 mg, Oral, BID, Basaraba, Iulia, MD, 20 mg at 10/15/23 0015   ipratropium-albuterol  (DUONEB) 0.5-2.5 (3) MG/3ML nebulizer solution 3 mL, 3 mL, Nebulization, Q4H PRN, Avi Body, MD   lactated ringers  infusion, , Intravenous, Continuous, Avi Body, MD, Last Rate: 75 mL/hr at 10/15/23 0844, Rate Verify at 10/15/23 0844   magnesium  sulfate IVPB 2 g 50 mL, 2 g, Intravenous, Once, Page Boast, RPH   ondansetron  (ZOFRAN ) tablet 4 mg, 4 mg, Oral, Q6H PRN **OR** ondansetron  (ZOFRAN ) injection 4 mg, 4 mg, Intravenous, Q6H PRN, Avi Body, MD   polyethylene glycol (MIRALAX  / GLYCOLAX ) packet 17 g, 17 g, Oral, Daily PRN, Avi Body,  MD   prednisoLONE  acetate (PRED FORTE ) 1 % ophthalmic suspension 2 drop, 2 drop, Left Eye, QID, Avi Body, MD   sodium chloride  flush (NS) 0.9 % injection 3 mL, 3 mL, Intravenous, Q12H, Avi Body, MD, 3 mL at 10/14/23 2353   timolol  (TIMOPTIC ) 0.5 % ophthalmic solution 1 drop, 1 drop, Both Eyes, Daily, Avi Body, MD   umeclidinium-vilanterol (ANORO ELLIPTA ) 62.5-25 MCG/ACT 1 puff, 1 puff, Inhalation, Daily, Avi Body, MD   valACYclovir  (VALTREX ) tablet 500 mg, 500 mg, Oral, QHS, Avi Body, MD  Current Outpatient Medications:    Ascorbic Acid  (VITAMIN C ) 1000 MG tablet, Take 1 tablet (1,000 mg total) by mouth daily., Disp: 90 tablet, Rfl: 3   Aspirin  81 MG CAPS, Take 1 capsule by mouth daily at 6 (six) AM., Disp: , Rfl:    atenolol  (TENORMIN ) 25 MG tablet, Take 1 tablet (25 mg total) by mouth 2 (two) times daily., Disp: 180 tablet, Rfl: 1   Calcium  Carb-Cholecalciferol (CALCIUM  1000 + D PO), Take 1 tablet by mouth daily., Disp: , Rfl:    famotidine  (PEPCID ) 20 MG tablet, Take 1 tablet (20 mg total) by mouth 2 (two) times daily., Disp: 180 tablet, Rfl: 1   fish oil-omega-3 fatty acids  1000 MG capsule, Take 2 capsules (2 g total) by mouth daily., Disp: 6 capsule, Rfl: 11   guaiFENesin  (MUCINEX ) 600 MG 12 hr tablet, Take 1 tablet (600 mg total) by mouth 2 (two) times daily., Disp: , Rfl:    Multiple Vitamin (MULTIVITAMIN WITH MINERALS) TABS tablet, Take 1 tablet by mouth daily., Disp: 30 tablet, Rfl: 0   prednisoLONE  acetate (PREDNISOLONE  ACETATE P-F) 1 % ophthalmic suspension, Place 2 drops into the left eye 4 (four) times daily., Disp: , Rfl:    RESTASIS  0.05 % ophthalmic emulsion, Place 1 drop into both eyes 2 (two) times daily., Disp: , Rfl:    telmisartan  (MICARDIS ) 40 MG tablet, Take 40 mg by mouth daily., Disp: , Rfl:    timolol  (TIMOPTIC ) 0.5 % ophthalmic solution, SMARTSIG:In Eye(s), Disp: , Rfl:    umeclidinium-vilanterol (ANORO ELLIPTA ) 62.5-25 MCG/ACT AEPB,  Inhale 1 puff into the lungs daily., Disp: 30 each, Rfl: 5   valACYclovir  (VALTREX ) 500 MG tablet, Take 500 mg by mouth at bedtime., Disp: , Rfl:    albuterol  (PROVENTIL ) (2.5 MG/3ML) 0.083% nebulizer solution, Take 3 mLs (2.5 mg total) by nebulization every 6 (six) hours as needed for wheezing or  shortness of breath. (Patient not taking: Reported on 2023-11-12), Disp: 75 mL, Rfl: 12   amoxicillin -clavulanate (AUGMENTIN ) 875-125 MG tablet, Take 1 tablet by mouth 2 (two) times daily. (Patient not taking: Reported on Nov 12, 2023), Disp: 10 tablet, Rfl: 0   Difluprednate 0.05 % EMUL, Place 1 drop into the left eye 2 (two) times daily., Disp: , Rfl:    dorzolamide -timolol  (COSOPT ) 22.3-6.8 MG/ML ophthalmic solution, Place 1 drop into both eyes 2 (two) times daily. (Patient not taking: Reported on 11-12-23), Disp: , Rfl:    melatonin 5 MG TABS, Take 1 tablet (5 mg total) by mouth at bedtime as needed. (Patient not taking: Reported on 09/10/2023), Disp: 30 tablet, Rfl: 0   telmisartan  (MICARDIS ) 80 MG tablet, Take 1 tablet (80 mg total) by mouth at bedtime. (Patient not taking: Reported on 11-12-23), Disp: 90 tablet, Rfl: 1   Zinc  Oxide (TRIPLE PASTE) 12.8 % ointment, Apply 1 Application topically. Every shift, Disp: , Rfl:    zinc  sulfate 220 (50 Zn) MG capsule, Take 1 capsule (220 mg total) by mouth daily., Disp: 30 capsule, Rfl: 11  Vitals   Vitals:   10/15/23 0745 10/15/23 0800 10/15/23 0830 10/15/23 0900  BP:  (!) 115/58 125/61 (!) 109/54  Pulse:  75 78 78  Resp:  (!) 22 (!) 24 (!) 22  Temp: (!) 95.9 F (35.5 C)     TempSrc: Axillary     SpO2:  99% 98% 97%  Weight:      Height:        Body mass index is 30.71 kg/m.  Physical Exam   Constitutional: Appears well-developed and well-nourished.   Neurologic Examination    Neuro: Mental Status: Patient is awake, alert, oriented to person, place, month, year, and situation. Patient is able to give a clear and coherent history. No signs  of aphasia or neglect Cranial Nerves: II: Visual Fields are full. Pupils are equal, round, and reactive to light.   III,IV, VI: EOMI without ptosis or diploplia.  V: Facial sensation is symmetric to temperature VII: Facial movement is symmetric.  VIII: hearing is intact to voice X: Uvula elevates symmetrically XII: tongue is midline without atrophy or fasciculations.  Motor: Tone is normal. Bulk is normal. 5/5 strength was present in all four extremities.  Sensory: Sensation is symmetric to light touch and temperature in the arms and legs. Cerebellar: She has significant tremor on finger-nose-finger bilaterally, but without clear past-pointing       Labs/Imaging/Neurodiagnostic studies   CBC:  Recent Labs  Lab 11/12/23 1946 10/15/23 0425  WBC 11.8* 16.7*  NEUTROABS 10.0*  --   HGB 14.9 13.2  HCT 45.5 41.1  MCV 96.6 98.8  PLT 152 119*   Basic Metabolic Panel:  Lab Results  Component Value Date   NA 138 10/15/2023   K 4.0 10/15/2023   CO2 18 (L) 10/15/2023   GLUCOSE 166 (H) 10/15/2023   BUN 24 (H) 10/15/2023   CREATININE 1.05 (H) 10/15/2023   CALCIUM  7.7 (L) 10/15/2023   GFRNONAA 51 (L) 10/15/2023   GFRAA >89 10/22/2011   Lipid Panel:  Lab Results  Component Value Date   LDLCALC 113 (H) 09/10/2023   HgbA1c:  Lab Results  Component Value Date   HGBA1C 5.9 09/10/2023   INR  Lab Results  Component Value Date   INR 1.2 Nov 12, 2023    MRI Brain(Personally reviewed): Subcortical stroke on the right   ASSESSMENT   KENNEDEY DIGILIO is a 89 y.o. female with subcortical  right hemispheric stroke.  It is slightly large for small vessel disease, but suspect thrombotic versus decompensated vessel disease in the setting of hypotension.  Less likely to be embolic.  Given that she may need surgery or procedure in the immediate timeframe, I would favor antiplatelet monotherapy with aspirin .   LDL was just recently checked in late March, no need to recheck as it was 105  and she has not been started on anticholesterol medicine.  A1c was also just checked and was 5.9, so no need to repeat.  RECOMMENDATIONS  Aspirin  81 mg daily Avoid hypotension Lipitor 40 mg daily Echo, telemetry PT, OT, ST ______________________________________________________________________    Signed, Ann Keto, MD [facility]

## 2023-10-15 NOTE — Progress Notes (Addendum)
 SLP Cancellation Note  Patient Details Name: Lauren Morris MRN: [id number] DOB: 16-Oct-1931   Cancelled treatment:       Reason Eval/Treat Not Completed: Medical issues which prohibited therapy (Pt hypothermic (temp 95.9) and currently on [employer]. Increased RR and AMS noted. Will continue efforts as appropriate.)  Addendum: Noted pt passed dysphagia screening. Pt also with pending Surgical Consult. Will defer clinical swallowing evaluation and cognitive-linguistic evaluation pending further medical work up.  Lauren Morris, M.S., CCC-SLP Speech-Language Pathologist [facility] [phone] [place])  Lauren Morris 10/15/2023, 9:45 AM

## 2023-10-15 NOTE — ED Notes (Signed)
 Overdue mag sulfate will be given once flagyl  completed.

## 2023-10-15 NOTE — Progress Notes (Addendum)
 OT Cancellation Note  Patient Details Name: Lauren Morris MRN: [id number] DOB: Nov 06, 1931   Cancelled Treatment:    Reason Eval/Treat Not Completed: Medical issues which prohibited therapy;Patient not medically ready. Consult received, chart reviewed. Secure chat with RN. Pt still on [employer]. Inappropriate for OT at this time. Will re-attempt at later date/time.   Addendum: 12:15pm: Pt temp continues to drop. Neurology and Gen Surg consults pending. Will hold this date and re-attempt OT evaluation next date as medically appropriate.   Syleena Mchan R., MPH, MS, OTR/L ascom [phone] 10/15/23, 9:08 AM

## 2023-10-15 NOTE — ED Notes (Signed)
 Messaged hospitalist re: BP low. Pt is mentating appropriately. Pt states respirations feel less labored. Looks slightly less labored than before.

## 2023-10-15 NOTE — ED Notes (Signed)
 Sent msg to update hospitalist.

## 2023-10-15 NOTE — ED Notes (Signed)
 Overdue meds from pharmacy are not available yet. Have messaged.

## 2023-10-15 NOTE — ED Notes (Signed)
 Pt eating a few bites with help of daughter. Pt will go to the floor soon.

## 2023-10-15 NOTE — ED Notes (Signed)
 Hospitalist at bedside explaining findings so far and plan of care/options.

## 2023-10-15 NOTE — ED Notes (Addendum)
 Error, note entered on wrong pt.

## 2023-10-15 NOTE — Progress Notes (Signed)
 PT Cancellation Note  Patient Details Name: Lauren Morris MRN: [id number] DOB: December 08, 1931   Cancelled Treatment:     PT evaluation and treatment not completed with 2nd attempt today in the PM today because pt demonstrates difficulty breathing, increased redness in face, uncomfortable and frail. BP 93/59 in supine. PT discussed with nurse and Nurse advised the same. PT will attempt again tomorrow if pt is appropriate.   Ashley Lawrence PT DPT 2:43 PM,10/15/23

## 2023-10-16 ENCOUNTER — Inpatient Hospital Stay

## 2023-10-16 DIAGNOSIS — K760 Fatty (change of) liver, not elsewhere classified: Secondary | ICD-10-CM | POA: Diagnosis not present

## 2023-10-16 DIAGNOSIS — A419 Sepsis, unspecified organism: Secondary | ICD-10-CM | POA: Diagnosis not present

## 2023-10-16 DIAGNOSIS — R6521 Severe sepsis with septic shock: Secondary | ICD-10-CM | POA: Diagnosis not present

## 2023-10-16 DIAGNOSIS — K8020 Calculus of gallbladder without cholecystitis without obstruction: Secondary | ICD-10-CM | POA: Diagnosis not present

## 2023-10-16 LAB — BLOOD CULTURE ID PANEL (REFLEXED) - BCID2

## 2023-10-16 LAB — CBC
HCT: 41 % (ref 36.0–46.0)
HCT: 39.8 % (ref 36.0–46.0)
Hemoglobin: 13.7 g/dL (ref 12.0–15.0)
Hemoglobin: 13.4 g/dL (ref 12.0–15.0)
MCH: 31.9 pg (ref 26.0–34.0)
MCH: 32.1 pg (ref 26.0–34.0)
MCHC: 33.4 g/dL (ref 30.0–36.0)
MCHC: 33.7 g/dL (ref 30.0–36.0)
MCV: 95.3 fL (ref 80.0–100.0)
MCV: 95.4 fL (ref 80.0–100.0)
Platelets: 134 10*3/uL — ABNORMAL LOW (ref 150–400)
Platelets: 131 10*3/uL — ABNORMAL LOW (ref 150–400)
RBC: 4.3 MIL/uL (ref 3.87–5.11)
RBC: 4.17 MIL/uL (ref 3.87–5.11)
RDW: 14.5 % (ref 11.5–15.5)
RDW: 14.5 % (ref 11.5–15.5)
WBC: 14 10*3/uL — ABNORMAL HIGH (ref 4.0–10.5)
WBC: 11.7 10*3/uL — ABNORMAL HIGH (ref 4.0–10.5)
nRBC: 0 % (ref 0.0–0.2)
nRBC: 0 % (ref 0.0–0.2)

## 2023-10-16 LAB — COMPREHENSIVE METABOLIC PANEL WITH GFR
ALT: 48 U/L — ABNORMAL HIGH (ref 0–44)
AST: 42 U/L — ABNORMAL HIGH (ref 15–41)
Albumin: 2.5 g/dL — ABNORMAL LOW (ref 3.5–5.0)
Alkaline Phosphatase: 200 U/L — ABNORMAL HIGH (ref 38–126)
Anion gap: 7 (ref 5–15)
BUN: 24 mg/dL — ABNORMAL HIGH (ref 8–23)
CO2: 19 mmol/L — ABNORMAL LOW (ref 22–32)
Calcium: 8.1 mg/dL — ABNORMAL LOW (ref 8.9–10.3)
Chloride: 111 mmol/L (ref 98–111)
Creatinine, Ser: 0.84 mg/dL (ref 0.44–1.00)
GFR, Estimated: >60 mL/min (ref >60)
Glucose, Bld: 109 mg/dL — ABNORMAL HIGH (ref 70–99)
Potassium: 3.5 mmol/L (ref 3.5–5.1)
Sodium: 137 mmol/L (ref 135–145)
Total Bilirubin: 1.2 mg/dL (ref 0.0–1.2)
Total Protein: 6 g/dL — ABNORMAL LOW (ref 6.5–8.1)

## 2023-10-16 LAB — ECHOCARDIOGRAM COMPLETE
AR max vel: 1.84 cm2
AV Area VTI: 1.92 cm2
AV Area mean vel: 1.77 cm2
AV Mean grad: 8.3 mmHg
AV Peak grad: 14.5 mmHg
Ao pk vel: 1.91 m/s
Area-P 1/2: 3.5 cm2
Height: 62 in
S' Lateral: 2 cm
Weight: 2686.4 [oz_av]

## 2023-10-16 LAB — PHOSPHORUS: Phosphorus: 3 mg/dL (ref 2.5–4.6)

## 2023-10-16 LAB — MAGNESIUM: Magnesium: 2.3 mg/dL (ref 1.7–2.4)

## 2023-10-16 MED ORDER — PREDNISOLONE ACETATE 1 % OP SUSP
2.0000 [drp] | Freq: Two times a day (BID) | OPHTHALMIC | Status: DC
  Administered 2023-10-16 – 2023-10-22 (×12): 2 [drp] via OPHTHALMIC

## 2023-10-16 MED ORDER — MORPHINE SULFATE (PF) 2 MG/ML IV SOLN
2.0000 mg | INTRAVENOUS | Status: DC | PRN
  Administered 2023-10-16 – 2023-10-17 (×2): 2 mg via INTRAVENOUS
  Filled 2023-10-16 (×2): qty 1

## 2023-10-16 MED ORDER — SODIUM BICARBONATE 650 MG PO TABS
650.0000 mg | ORAL_TABLET | Freq: Three times a day (TID) | ORAL | Status: AC
  Administered 2023-10-16 – 2023-10-17 (×6): 650 mg via ORAL
  Filled 2023-10-16 (×6): qty 1

## 2023-10-16 MED ORDER — SODIUM CHLORIDE 0.9 % IV SOLN: INTRAVENOUS | Status: AC

## 2023-10-16 MED ORDER — GADOBUTROL 1 MMOL/ML IV SOLN
7.0000 mL | Freq: Once | INTRAVENOUS | Status: AC | PRN
  Administered 2023-10-16: 7 mL via INTRAVENOUS

## 2023-10-16 MED ORDER — MIDODRINE HCL 5 MG PO TABS
5.0000 mg | ORAL_TABLET | Freq: Three times a day (TID) | ORAL | Status: DC
  Administered 2023-10-16 – 2023-10-21 (×15): 5 mg via ORAL
  Filled 2023-10-16 (×16): qty 1

## 2023-10-16 MED ORDER — ALBUMIN HUMAN 25 % IV SOLN
25.0000 g | Freq: Once | INTRAVENOUS | Status: AC
  Administered 2023-10-16: 12.5 g via INTRAVENOUS
  Filled 2023-10-16: qty 100

## 2023-10-16 MED ORDER — SALINE SPRAY 0.65 % NA SOLN: 1.0000 | NASAL | Status: DC | PRN

## 2023-10-16 MED ORDER — FUROSEMIDE 10 MG/ML IJ SOLN
80.0000 mg | Freq: Once | INTRAMUSCULAR | Status: AC
  Administered 2023-10-16: 80 mg via INTRAVENOUS
  Filled 2023-10-16: qty 8

## 2023-10-16 NOTE — Plan of Care (Signed)
   Problem: Education: Goal: Knowledge of General Education information will improve Description Including pain rating scale, medication(s)/side effects and non-pharmacologic comfort measures Outcome: Progressing

## 2023-10-16 NOTE — Evaluation (Signed)
 Clinical/Bedside Swallow Evaluation Patient Details  Name: Lauren Morris MRN: [id number] Date of Birth: 01/31/1932  Today's Date: 10/16/2023 Time: SLP Start Time (ACUTE ONLY): 1318 SLP Stop Time (ACUTE ONLY): 1328 SLP Time Calculation (min) (ACUTE ONLY): 10 min  Past Medical History:  Past Medical History:  Diagnosis Date   Community acquired pneumonia 04/05/2016   Cyst    hx o on right breast   DJD (degenerative joint disease)    right knee   Glaucoma    Hypertension    Pneumonia due to COVID-19 virus 12/12/2020   Sleep apnea    Thyroid  disease    Past Surgical History:  Past Surgical History:  Procedure Laterality Date   ABDOMINAL HYSTERECTOMY     VITRECTOMY  July 2012   right eye with membrane peel Marlane Silver, [place])   HPI:  89 y.o. female with PMHx of recurrent Klebsiella UTI, T2DM, Graves' disease now with acquired hypothyroidism, HTN, HLD, OSA on CPAP, recurrent bronchitis, meningioma, who presents to the ED due to altered mental status. MRI brain, "1. Right basal ganglia acute infarct. No hemorrhage or mass effect.   2. Unchanged right posterior fossa meningioma." CXR, "1. Interval development of at least small left pleural effusion.   Followup PA and lateral chest X-ray is recommended in 3-4 weeks   following therapy to ensure resolution and exclude underlying   malignancy.   2. Aortic Atherosclerosis (ICD10-I70.0)."    Assessment / Plan / Recommendation  Clinical Impression  Pt seen for clinical swallowing evaluation. Pt demonstrated an intact oropharyngeal swallow per clinical assessment. Recommend continuation of current diet with standard aspiration precautions. SLP to sign off as pt has no acute SLP needs. Recommend post-acute SLP services for cognitive-communication evaluation in a more functional setting. SLP Visit Diagnosis: Dysphagia, unspecified (R13.10)    Aspiration Risk  Mild aspiration risk    Diet Recommendation Regular;Thin liquid    Liquid Administration  via: Spoon;Cup;Straw Medication Administration: Whole meds with liquid Supervision: Patient able to self feed Compensations: Slow rate;Small sips/bites Postural Changes: Seated upright at 90 degrees;Remain upright for at least 30 minutes after po intake    Other  Recommendations Oral Care Recommendations: Oral care QID    Recommendations for follow up therapy are one component of a multi-disciplinary discharge planning process, led by the attending physician.  Recommendations may be updated based on patient status, additional functional criteria and insurance authorization.  Follow up Recommendations Skilled nursing-short term rehab (<3 hours/day) (for cognitive-communication evaluation)      Assistance Recommended at Discharge Intermittent Supervision/Assistance  Functional Status Assessment Patient has had a recent decline in their functional status and demonstrates the ability to make significant improvements in function in a reasonable and predictable amount of time. (for cognitive-communcation; swallowing WFL)         Prognosis Prognosis for improved oropharyngeal function: Good      Swallow Study   General HPI: 89 y.o. female with PMHx of recurrent Klebsiella UTI, T2DM, Graves' disease now with acquired hypothyroidism, HTN, HLD, OSA on CPAP, recurrent bronchitis, meningioma, who presents to the ED due to altered mental status. MRI brain, "1. Right basal ganglia acute infarct. No hemorrhage or mass effect.   2. Unchanged right posterior fossa meningioma." CXR, "1. Interval development of at least small left pleural effusion.   Followup PA and lateral chest X-ray is recommended in 3-4 weeks   following therapy to ensure resolution and exclude underlying   malignancy.   2. Aortic Atherosclerosis (ICD10-I70.0)." Type of Study: Bedside  Swallow Evaluation Previous Swallow Assessment: none Diet Prior to this Study: Regular;Thin liquids (Level 0) Temperature Spikes Noted: No Respiratory  Status: Nasal cannula (3L/min; WBC 14.0) History of Recent Intubation: No Behavior/Cognition: Alert;Cooperative;Pleasant mood Oral Cavity Assessment: Within Functional Limits Oral Care Completed by SLP: Recent completion by staff Oral Cavity - Dentition: Adequate natural dentition Vision: Functional for self-feeding Self-Feeding Abilities: Able to feed self Patient Positioning: Upright in chair Baseline Vocal Quality: Normal    Oral/Motor/Sensory Function Overall Oral Motor/Sensory Function: Within functional limits   Ice Chips Ice chips: Not tested   Thin Liquid Thin Liquid: Within functional limits Presentation: Straw;Self Fed    Nectar Thick Nectar Thick Liquid: Not tested   Honey Thick Honey Thick Liquid: Not tested   Puree Puree: Not tested   Solid     Solid: Within functional limits Presentation: Self Fed     Dia Forget, M.S., CCC-SLP Speech-Language Pathologist [facility] [phone] (ASCOM)  Leory Rands Merrilee Ancona 10/16/2023,1:46 PM

## 2023-10-16 NOTE — Evaluation (Signed)
 Physical Therapy Evaluation Patient Details Name: Lauren Morris MRN: [id number] DOB: 1931/10/18 Today's Date: 10/16/2023  History of Present Illness  Pt is an 89 year old presenting to the ED with AMS, admitted with septic shock, cholecystitis, UTI, and with MRI of the brain obtained which demonstrated a small to moderate-sized subcortical stroke on the right. PMH significant for Klebsiella UTI, type 2 diabetes, Graves' disease now with acquired hypothyroidism, hypertension, hyperlipidemia, OSA on CPAP, recurrent bronchitis, meningioma   Clinical Impression  Pt was pleasant and motivated to participate during the session and put forth good effort throughout. Pt presented with notable asymmetries in both UE and LE strength with her left side grossly weaker than her right per below.  Despite L-sided weakness pt was able to perform transfers with only minimal assist and was able to amb 12 feet with step-to pattern with no overt LOB or L knee buckling.  Pt reported no adverse symptoms during the session with SpO2 and HR WNL throughout on 3LO2/min.  Pt will benefit from continued PT services upon discharge to safely address deficits listed in patient problem list for decreased caregiver assistance and eventual return to PLOF.           If plan is discharge home, recommend the following: A lot of help with walking and/or transfers;A little help with bathing/dressing/bathroom;Assistance with cooking/housework;Help with stairs or ramp for entrance;Assist for transportation   Can travel by private vehicle   Yes    Equipment Recommendations None recommended by PT  Recommendations for Other Services       Functional Status Assessment Patient has had a recent decline in their functional status and demonstrates the ability to make significant improvements in function in a reasonable and predictable amount of time.     Precautions / Restrictions Precautions Precautions: Fall Recall of  Precautions/Restrictions: Intact Restrictions Weight Bearing Restrictions Per Provider Order: No      Mobility  Bed Mobility               General bed mobility comments: NT, in recliner    Transfers Overall transfer level: Needs assistance Equipment used: Rolling walker (2 wheels) Transfers: Sit to/from Stand Sit to Stand: Min assist           General transfer comment: Min verbal and tactile cues for sequencing    Ambulation/Gait Ambulation/Gait assistance: Contact guard assist Gait Distance (Feet): 12 Feet Assistive device: Rolling walker (2 wheels) Gait Pattern/deviations: Step-to pattern, Trunk flexed, Decreased stance time - left, Decreased step length - right Gait velocity: decreased     General Gait Details: Step-to sequencing with decreased stance time on the weaker LLE but generally steady with no overt LOB or buckling of the LLE  Stairs            Wheelchair Mobility     Tilt Bed    Modified Rankin (Stroke Patients Only)       Balance           Standing balance support: Bilateral upper extremity supported, During functional activity, Reliant on assistive device for balance Standing balance-Leahy Scale: Fair                               Pertinent Vitals/Pain Pain Assessment Pain Assessment: No/denies pain    Home Living Family/patient expects to be discharged to:: Private residence Living Arrangements: Alone Available Help at Discharge: Family;Available PRN/intermittently Type of Home: House Home Access: Stairs to  enter Entrance Stairs-Rails: Right Entrance Stairs-Number of Steps: 3   Home Layout: One level Home Equipment: Agricultural consultant (2 wheels);Cane - single point;Shower seat - built in;BSC/3in1;Rollator (4 wheels)      Prior Function Prior Level of Function : Independent/Modified Independent             Mobility Comments: Mod Ind amb with a SPC in the home and limited distances outside the home,  uses a rollator for longer community mobility, no falls in the last 6 months ADLs Comments: MOD I with ADL, assist for IADL such as for cleaning from family, does not drive     Extremity/Trunk Assessment   Upper Extremity Assessment Upper Extremity Assessment: Right hand dominant;LUE deficits/detail LUE Deficits / Details: LUE strength grossly 3+ to 4-/5 compared to 4 to 4+/5 on the RUE, decreased fine motor but grossly equal left/right LUE Sensation: WNL LUE Coordination: decreased fine motor    Lower Extremity Assessment Lower Extremity Assessment: LLE deficits/detail LLE Deficits / Details: LLE strength grossly 3+ to 4-/5 compared to 4+ to 5/5 on the RLE LLE Sensation: WNL       Communication   Communication Communication: No apparent difficulties    Cognition Arousal: Alert Behavior During Therapy: WFL for tasks assessed/performed   PT - Cognitive impairments: No apparent impairments                         Following commands: Intact       Cueing Cueing Techniques: Verbal cues, Tactile cues, Visual cues     General Comments General comments (skin integrity, edema, etc.): spo2 to 88% on 3.5L via [place] during mobility, >90% with PLB after approx 30 seconds; HR in 80s bpm post mobility    Exercises     Assessment/Plan    PT Assessment Patient needs continued PT services  PT Problem List Decreased strength;Decreased activity tolerance;Decreased balance;Decreased mobility;Decreased knowledge of use of DME       PT Treatment Interventions DME instruction;Gait training;Stair training;Functional mobility training;Therapeutic activities;Therapeutic exercise;Balance training;Neuromuscular re-education;Patient/family education    PT Goals (Current goals can be found in the Care Plan section)  Acute Rehab PT Goals Patient Stated Goal: To get strong enough to return home PT Goal Formulation: With patient Time For Goal Achievement: 10/29/23 Potential to Achieve  Goals: Good    Frequency Min 3X/week     Co-evaluation               AM-PAC PT "6 Clicks" Mobility  Outcome Measure Help needed turning from your back to your side while in a flat bed without using bedrails?: A Little Help needed moving from lying on your back to sitting on the side of a flat bed without using bedrails?: A Little Help needed moving to and from a bed to a chair (including a wheelchair)?: A Little Help needed standing up from a chair using your arms (e.g., wheelchair or bedside chair)?: A Little Help needed to walk in hospital room?: A Little Help needed climbing 3-5 steps with a railing? : A Lot 6 Click Score: 17    End of Session Equipment Utilized During Treatment: Gait belt;Oxygen  Activity Tolerance: Patient tolerated treatment well Patient left: in chair;with call bell/phone within reach;with chair alarm set;with family/visitor present Nurse Communication: Mobility status PT Visit Diagnosis: Difficulty in walking, not elsewhere classified (R26.2);Muscle weakness (generalized) (M62.81);Hemiplegia and hemiparesis Hemiplegia - Right/Left: Left Hemiplegia - dominant/non-dominant: Non-dominant    Time: 1010-1039 PT Time Calculation (min) (ACUTE  ONLY): 29 min   Charges:   PT Evaluation $PT Eval Moderate Complexity: 1 Mod   PT General Charges $$ ACUTE PT VISIT: 1 Visit    D. Scott Venessa Wickham PT, DPT 10/16/23, 11:24 AM

## 2023-10-16 NOTE — Progress Notes (Signed)
 PHARMACY - PHYSICIAN COMMUNICATION CRITICAL VALUE ALERT - BLOOD CULTURE IDENTIFICATION (BCID)  Lauren Morris is an 89 y.o. female who presented to [facility] on 10/14/2023 with a chief complaint of sepsis  Assessment:  Staph species in 1 of 4 bottles (aerobic).  Most likely contaminat.  (include suspected source if known)  Name of physician (or Provider) Contacted: Alonza Arthurs, NP   Current antibiotics: Ceftriaxone  2 gm IV Q24H  Changes to prescribed antibiotics recommended:  Patient is on recommended antibiotics - No changes needed  Results for orders placed or performed during the hospital encounter of 10/14/23  Blood Culture ID Panel (Reflexed) (Collected: 10/14/2023  7:42 PM)  Result Value Ref Range   Enterococcus faecalis NOT DETECTED NOT DETECTED   Enterococcus Faecium NOT DETECTED NOT DETECTED   Listeria monocytogenes NOT DETECTED NOT DETECTED   Staphylococcus species DETECTED (A) NOT DETECTED   Staphylococcus aureus (BCID) NOT DETECTED NOT DETECTED   Staphylococcus epidermidis NOT DETECTED NOT DETECTED   Staphylococcus lugdunensis NOT DETECTED NOT DETECTED   Streptococcus species NOT DETECTED NOT DETECTED   Streptococcus agalactiae NOT DETECTED NOT DETECTED   Streptococcus pneumoniae NOT DETECTED NOT DETECTED   Streptococcus pyogenes NOT DETECTED NOT DETECTED   A.calcoaceticus-baumannii NOT DETECTED NOT DETECTED   Bacteroides fragilis NOT DETECTED NOT DETECTED   Enterobacterales NOT DETECTED NOT DETECTED   Enterobacter cloacae complex NOT DETECTED NOT DETECTED   Escherichia coli NOT DETECTED NOT DETECTED   Klebsiella aerogenes NOT DETECTED NOT DETECTED   Klebsiella oxytoca NOT DETECTED NOT DETECTED   Klebsiella pneumoniae NOT DETECTED NOT DETECTED   Proteus species NOT DETECTED NOT DETECTED   Salmonella species NOT DETECTED NOT DETECTED   Serratia marcescens NOT DETECTED NOT DETECTED   Haemophilus influenzae NOT DETECTED NOT DETECTED   Neisseria meningitidis NOT  DETECTED NOT DETECTED   Pseudomonas aeruginosa NOT DETECTED NOT DETECTED   Stenotrophomonas maltophilia NOT DETECTED NOT DETECTED   Candida albicans NOT DETECTED NOT DETECTED   Candida auris NOT DETECTED NOT DETECTED   Candida glabrata NOT DETECTED NOT DETECTED   Candida krusei NOT DETECTED NOT DETECTED   Candida parapsilosis NOT DETECTED NOT DETECTED   Candida tropicalis NOT DETECTED NOT DETECTED   Cryptococcus neoformans/gattii NOT DETECTED NOT DETECTED    Sausha Raymond D 10/16/2023  5:50 AM

## 2023-10-16 NOTE — Progress Notes (Signed)
 Echo without evidence of embolic source and telemetry without evidence of atrial fibrillation.  Secondary stroke prevention will remain antiplatelet therapy with aspirin  81 mg daily and high intensity statin.  Neurology will be available as needed, please call with further questions or concerns.  Ann Keto, MD [facility]   If 7pm- 7am, please page neurology on call as listed in AMION.

## 2023-10-16 NOTE — Evaluation (Signed)
 Occupational Therapy Evaluation Patient Details Name: Lauren Morris MRN: [id number] DOB: Oct 13, 1931 Today's Date: 10/16/2023   History of Present Illness   Pt is an 89 year old presenting to the ED with AMS, admitted with septic shock, cholecystitis,  MRI of the brain was obtained which demonstrates a small to moderate-sized subcortical stroke on the right      PMH significant for Klebsiella UTI, type 2 diabetes, Graves' disease now with acquired hypothyroidism, hypertension, hyperlipidemia, OSA on CPAP, recurrent bronchitis, meningioma     Clinical Impressions Chart reviewed, pt greeted semi supine in bed, alert and oriented x4, agreeable to OT evaluation. PTA pt is generally MOD I-I in ADL, has assist for IADL, amb with SPC. Pt presents with deficits in strength, endurance, activity tolerance, balance, affecting safe and optimal ADL completion. Bed mobility completed with MOD-MAX A, STS with MIN A multiple attempts with intermittent vcs for technique, two step pivot transfers completed with MIN A with RW, intermittent vcs for technique. MAX A required for peri care following continent urine on bsc. Pt is performing ADL below PLOF, will benefit from acute OT to address functional deficits and to facilitate optimal ADL performance. Pt is left in chair, all needs met. OT will follow acutely.     If plan is discharge home, recommend the following:   A little help with walking and/or transfers;A lot of help with bathing/dressing/bathroom;Assistance with cooking/housework;Help with stairs or ramp for entrance     Functional Status Assessment   Patient has had a recent decline in their functional status and demonstrates the ability to make significant improvements in function in a reasonable and predictable amount of time.     Equipment Recommendations   Other (comment)     Recommendations for Other Services         Precautions/Restrictions   Precautions Precautions: Fall Recall  of Precautions/Restrictions: Intact Restrictions Weight Bearing Restrictions Per Provider Order: No     Mobility Bed Mobility Overal bed mobility: Needs Assistance Bed Mobility: Supine to Sit     Supine to sit: Mod assist, Max assist, HOB elevated, Used rails     General bed mobility comments: frequent vcs for technique    Transfers Overall transfer level: Needs assistance Equipment used: Rolling walker (2 wheels) Transfers: Sit to/from Stand Sit to Stand: Min assist           General transfer comment: intermittent vcs for RW technique      Balance Overall balance assessment: Needs assistance Sitting-balance support: Feet supported Sitting balance-Leahy Scale: Fair     Standing balance support: Bilateral upper extremity supported, During functional activity, Reliant on assistive device for balance Standing balance-Leahy Scale: Poor                             ADL either performed or assessed with clinical judgement   ADL Overall ADL's : Needs assistance/impaired Eating/Feeding: Set up;Supervision/ safety;Sitting   Grooming: Set up;Sitting       Lower Body Bathing: Maximal assistance;Sit to/from stand       Lower Body Dressing: Maximal assistance   Toilet Transfer: Minimal assistance;BSC/3in1;Rolling walker (2 wheels) Toilet Transfer Details (indicate cue type and reason): step pivot to bsc and then to chair Toileting- Clothing Manipulation and Hygiene: Maximal assistance;Sit to/from stand Toileting - Clothing Manipulation Details (indicate cue type and reason): continent urine on bsc     Functional mobility during ADLs: Minimal assistance;Rolling walker (2 wheels) (step pivot transfers)  Vision Baseline Vision/History: 3 Glaucoma Patient Visual Report: No change from baseline Vision Assessment?: Yes Eye Alignment: Within Functional Limits Ocular Range of Motion: Within Functional Limits Tracking/Visual Pursuits: Able to track  stimulus in all quads without difficulty Additional Comments: reports blurriess, does have glaucoma and takes "many" drops for her per report     Perception Perception: Within Functional Limits       Praxis Praxis: WFL       Pertinent Vitals/Pain Pain Assessment Pain Assessment: No/denies pain     Extremity/Trunk Assessment Upper Extremity Assessment Upper Extremity Assessment: Generalized weakness (no focal deficit noted)   Lower Extremity Assessment Lower Extremity Assessment: Generalized weakness       Communication Communication Communication: No apparent difficulties   Cognition Arousal: Alert Behavior During Therapy: WFL for tasks assessed/performed Cognition: No apparent impairments                               Following commands: Impaired Following commands impaired: Follows one step commands with increased time     Cueing  General Comments   Cueing Techniques: Verbal cues  spo2 to 88% on 3.5L via [place] during mobility, >90% with PLB after approx 30 seconds; HR in 80s bpm post mobility   Exercises Other Exercises Other Exercises: edu pt/daugther re: role of OT, role of rehab, discharge recommendations, safe ADL completion   Shoulder Instructions      Home Living Family/patient expects to be discharged to:: Private residence Living Arrangements: Alone Available Help at Discharge: Family Type of Home: House Home Access: Stairs to enter Secretary/administrator of Steps: 2 Entrance Stairs-Rails: Right Home Layout: One level     Bathroom Shower/Tub: Producer, television/film/video: Standard     Home Equipment: Agricultural consultant (2 wheels);Cane - single point;Shower seat - built in;BSC/3in1          Prior Functioning/Environment Prior Level of Function : Independent/Modified Independent             Mobility Comments: use of SPC ADLs Comments: MOD I with ADL, assist for IADL such as for cleaning from family, does not drive     OT Problem List: Decreased activity tolerance;Decreased strength;Decreased knowledge of use of DME or AE;Decreased knowledge of precautions;Decreased coordination;Impaired balance (sitting and/or standing)   OT Treatment/Interventions: Self-care/ADL training;Energy conservation;Balance training;Therapeutic exercise;DME and/or AE instruction;Neuromuscular education;Manual therapy;Therapeutic activities;Patient/family education      OT Goals(Current goals can be found in the care plan section)   Acute Rehab OT Goals Patient Stated Goal: improve funciton OT Goal Formulation: With patient/family Time For Goal Achievement: 10/30/23 Potential to Achieve Goals: Good ADL Goals Pt Will Perform Grooming: with modified independence;sitting;standing Pt Will Perform Lower Body Dressing: with modified independence;sit to/from stand;sitting/lateral leans Pt Will Transfer to Toilet: with modified independence;ambulating Pt Will Perform Toileting - Clothing Manipulation and hygiene: with modified independence;sitting/lateral leans;sit to/from stand   OT Frequency:  Min 2X/week    Co-evaluation              AM-PAC OT "6 Clicks" Daily Activity     Outcome Measure Help from another person eating meals?: None Help from another person taking care of personal grooming?: None Help from another person toileting, which includes using toliet, bedpan, or urinal?: A Lot Help from another person bathing (including washing, rinsing, drying)?: A Lot Help from another person to put on and taking off regular upper body clothing?: A Little Help from another person  to put on and taking off regular lower body clothing?: A Lot 6 Click Score: 17   End of Session Equipment Utilized During Treatment: Gait belt;Rolling walker (2 wheels);Oxygen  Nurse Communication: Mobility status  Activity Tolerance: Patient tolerated treatment well Patient left: in chair;with call bell/phone within reach;with chair alarm  set  OT Visit Diagnosis: Other abnormalities of gait and mobility (R26.89);Unsteadiness on feet (R26.81)                Time: 1610-9604 OT Time Calculation (min): 37 min Charges:  OT General Charges $OT Visit: 1 Visit OT Evaluation $OT Eval Moderate Complexity: 1 Mod  Gerre Kraft, OTD OTR/L  10/16/23, 10:48 AM

## 2023-10-16 NOTE — Consult Note (Signed)
 PHARMACY CONSULT NOTE - ELECTROLYTES  Pharmacy Consult for Electrolyte Monitoring and Replacement   Recent Labs: Potassium (mmol/L)  Date Value  10/16/2023 3.5   Magnesium  (mg/dL)  Date Value  16/03/9603 2.3   Calcium  (mg/dL)  Date Value  54/02/8118 8.1 (L)   Albumin  (g/dL)  Date Value  14/78/2956 2.5 (L)   Phosphorus (mg/dL)  Date Value  21/30/8657 3.0   Sodium (mmol/L)  Date Value  10/16/2023 137   Height: 5\' 2"  (157.5 cm) Weight: 76.2 kg (167 lb 14.4 oz) IBW/kg (Calculated) : 50.1 Estimated Creatinine Clearance: 43.4 mL/min (by C-G formula based on SCr of 0.84 mg/dL).  Assessment  Lauren Morris is a 89 y.o. female presenting with septic shock. PMH significant for recurrent Klebsiella UTI, type 2 diabetes, Graves' disease now with acquired hypothyroidism, hypertension, hyperlipidemia, OSA on CPAP, recurrent bronchitis, meningioma . Pharmacy has been consulted to monitor and replace electrolytes.  Diet: Dysphagia 1 MIVF:NS @ 30ml/hr Pertinent medications: N/A  Goal of Therapy: Electrolytes within normal limits  Plan:  Electrolytes are within normal limits this morning. No replacement needed. Follow-up electrolytes with AM labs tomorrow  Brennon Otterness Rodriguez-Guzman PharmD, BCPS 10/16/2023 7:41 AM

## 2023-10-16 NOTE — Progress Notes (Signed)
 [facility] SURGICAL PROGRESS NOTE (cpt [phone])  Hospital Day(s): 2.   Interval History: Patient seen and examined, no acute events or new complaints overnight. Patient reports she is without abdominal pain. No fever, chills, nausea, emesis. Leukocytosis is improving; WBC 14.0K this AM. Hgb to 13.7. Renal function normalized; sCr - 0.84; UO - 2050 ccs. Bilirubin normalized to 1.2. LFTs improved as well. She is on Rocephin /Flagyl . Regular diet; tolerating small amounts   Review of Systems:  Constitutional: denies fever, chills  HEENT: denies cough or congestion  Respiratory: denies any shortness of breath  Cardiovascular: denies chest pain or palpitations  Gastrointestinal: denies abdominal pain, N/V Genitourinary: denies burning with urination or urinary frequency Musculoskeletal: denies pain, decreased motor or sensation  Vital signs in last 24 hours: [min-max] current  Temp:  [94.9 F (34.9 C)-97.7 F (36.5 C)] 97.7 F (36.5 C) (04/23 0312) Pulse Rate:  [71-90] 72 (04/23 0312) Resp:  [22-34] 24 (04/23 0312) BP: (86-171)/(36-101) 103/47 (04/23 0312) SpO2:  [92 %-99 %] 94 % (04/23 0312) FiO2 (%):  [30 %] 30 % (04/22 2303)     Height: 5\' 2"  (157.5 cm) Weight: 76.2 kg BMI (Calculated): 30.7   Intake/Output last 2 shifts:  04/22 0701 - 04/23 0700 In: 1543.8 [P.O.:360; I.V.:850; IV Piggyback:333.8] Out: 2050 [Urine:2050]   Physical Exam:  Constitutional: alert, cooperative and no distress  HENT: normocephalic without obvious abnormality  Eyes: PERRL, EOM's grossly intact and symmetric  Respiratory: breathing non-labored at rest  Cardiovascular: regular rate and sinus rhythm  Gastrointestinal: soft, non-tender, and non-distended, no rebound/guarding. Murphy Sign negative    Labs:     Latest Ref Rng & Units 10/16/2023    4:23 AM 10/15/2023    4:25 AM 10/14/2023    7:46 PM  CBC  WBC 4.0 - 10.5 K/uL 14.0  16.7  11.8   Hemoglobin 12.0 - 15.0 g/dL 46.9  62.9  52.8    Hematocrit 36.0 - 46.0 % 41.0  41.1  45.5   Platelets 150 - 400 K/uL 134  119  152       Latest Ref Rng & Units 10/16/2023    4:23 AM 10/15/2023    4:25 AM 10/14/2023    7:46 PM  CMP  Glucose 70 - 99 mg/dL 413  244  010   BUN 8 - 23 mg/dL 24  24  24    Creatinine 0.44 - 1.00 mg/dL 2.72  5.36  6.44   Sodium 135 - 145 mmol/L 137  138  140   Potassium 3.5 - 5.1 mmol/L 3.5  4.0  3.2   Chloride 98 - 111 mmol/L 111  112  108   CO2 22 - 32 mmol/L 19  18  21    Calcium  8.9 - 10.3 mg/dL 8.1  7.7  8.6   Total Protein 6.5 - 8.1 g/dL 6.0  6.1  6.6   Total Bilirubin 0.0 - 1.2 mg/dL 1.2  1.6  1.9   Alkaline Phos 38 - 126 U/L 200  248  328   AST 15 - 41 U/L 42  76  97   ALT 0 - 44 U/L 48  60  69     Imaging studies: No new pertinent imaging studies   Assessment/Plan: (ICD-10's: K34.20) 89 y.o. female presenting with AMS found to have acute right basal ganglia CVA also with cholelithiasis and NAFLD with chronic LFT elevation, complicated by advanced age, comorbid conditions   - Again, from a surgical perspective, low suspicion for acute  cholecystitis at this time. She is against any procedures as well if possible.   - We will plan for MRI Liver tomorrow to quantify degree of cirrhosis  - Continue diet as tolerated   - Continue IV Abx    - Monitor abdominal examination    - Monitor leukocytosis; improved              - Further management per primary service; we will be happy to follow along   All of the above findings and recommendations were discussed with the patient, patient's family(daughter at bedside), and the medical team, and all of patient's and family's questions were answered to their expressed satisfaction.  -- Apolonio Bay, PA-C [facility] 10/16/2023, 7:33 AM M-F: 7am - 4pm

## 2023-10-16 NOTE — Evaluation (Signed)
 Speech Language Pathology Evaluation Patient Details Name: Lauren Morris MRN: [id number] DOB: [birth year] Today's Date: 10/16/2023 Time: 4098-1191 SLP Time Calculation (min) (ACUTE ONLY): 13 min  Problem List:  Patient Active Problem List   Diagnosis Date Noted   Calculus of gallbladder without cholecystitis without obstruction 10/15/2023   Septic shock (HCC) 10/14/2023   OSA (obstructive sleep apnea) 10/14/2023   AKI (acute kidney injury) (HCC) 10/14/2023   Maxillary sinusitis, acute 09/10/2023   Viral URI 09/02/2023   Chronic bronchitis, simple (HCC) 11/27/2022   UTI due to Klebsiella species 11/11/2022   SIRS (systemic inflammatory response syndrome) (HCC) 10/10/2022   Elevated liver enzymes 10/10/2022   Graves disease 02/16/2022   UTI (urinary tract infection) 11/12/2021   Dizziness 08/03/2021   Do not resuscitate status 08/03/2021   Musculoskeletal back pain 08/03/2021   Meningioma (HCC) 08/03/2021   Weakness 08/02/2021   Vertigo 08/02/2021   Hospital discharge follow-up 12/12/2020   Personal history of PE (pulmonary embolism) 12/12/2020   Obesity, diabetes, and hypertension syndrome (HCC) 11/24/2020   COPD (chronic obstructive pulmonary disease) (HCC) 01/16/2020   Abnormal EKG 07/07/2019   Generalized weakness 07/07/2019   H/O: hypothyroidism 07/07/2019   Abnormal chest CT 05/25/2016   Fatty liver disease, nonalcoholic 10/09/2014   Type 2 diabetes mellitus (HCC) 08/04/2012   Hyperlipidemia LDL goal <100 04/15/2011   Essential hypertension 02/22/2011   Acquired hypothyroidism 02/22/2011   Past Medical History:  Past Medical History:  Diagnosis Date   Community acquired pneumonia 04/05/2016   Cyst    hx o on right breast   DJD (degenerative joint disease)    right knee   Glaucoma    Hypertension    Pneumonia due to COVID-19 virus 12/12/2020   Sleep apnea    Thyroid  disease    Past Surgical History:  Past Surgical History:  Procedure Laterality Date    ABDOMINAL HYSTERECTOMY     VITRECTOMY  July 2012   right eye with membrane peel Lauren Morris)   HPI:  89 y.o. female with PMHx of recurrent Klebsiella UTI, T2DM, Graves' disease now with acquired hypothyroidism, HTN, HLD, OSA on CPAP, recurrent bronchitis, meningioma, who presents to the ED due to altered mental status. MRI brain, "1. Right basal ganglia acute infarct. No hemorrhage or mass effect.   2. Unchanged right posterior fossa meningioma." CXR, "1. Interval development of at least small left pleural effusion.   Followup PA and lateral chest X-ray is recommended in 3-4 weeks   following therapy to ensure resolution and exclude underlying   malignancy.   2. Aortic Atherosclerosis (ICD10-I70.0)."   Assessment / Plan / Recommendation Clinical Impression  Pt seen for cognitive-communication evaluation. Pt presents with mild difficulty with immediate and short term recall and processing of complex auditory comprehension. Pt also with minimal articulatory imprecision; however, speech is intelligible to an unfamiliar listener. Pt and family note cognitive and communication changes since admisision. Recommend post-acute SLP services for functional cognitive-communication evaluation in a more functional setting. SLP to sign off at this level of care as pt has no acute SLP needs.    SLP Assessment  SLP Recommendation/Assessment: All further Speech Lanaguage Pathology  needs can be addressed in the next venue of care SLP Visit Diagnosis: Cognitive communication deficit (R41.841)    Recommendations for follow up therapy are one component of a multi-disciplinary discharge planning process, led by the attending physician.  Recommendations may be updated based on patient status, additional functional criteria and insurance authorization.    Follow  Up Recommendations  Skilled nursing-short term rehab (<3 hours/day)    Assistance Recommended at Discharge  Intermittent Supervision/Assistance  Functional  Status Assessment Patient has had a recent decline in their functional status and demonstrates the ability to make significant improvements in function in a reasonable and predictable amount of time.        SLP Evaluation Cognition  Overall Cognitive Status: Impaired/Different from baseline Arousal/Alertness: Awake/alert Orientation Level: Oriented X4 Attention: Sustained Memory: Impaired Memory Impairment: Storage deficit;Retrieval deficit Awareness: Appears intact Problem Solving: Appears intact Safety/Judgment: Appears intact       Comprehension  Auditory Comprehension Overall Auditory Comprehension: Appears within functional limits for tasks assessed (extra time)    Expression Expression Primary Mode of Expression: Verbal Verbal Expression Overall Verbal Expression: Appears within functional limits for tasks assessed   Oral / Motor  Oral Motor/Sensory Function Overall Oral Motor/Sensory Function: Within functional limits Motor Speech Overall Motor Speech: Appears within functional limits for tasks assessed Respiration: Within functional limits Phonation: Normal Resonance: Within functional limits Articulation:  (minimal) Intelligibility: Intelligible Motor Planning: Witnin functional limits           Dia Forget, M.S., CCC-SLP Speech-Language Pathologist [facility] [phone] (ASCOM)  Lauren Morris 10/16/2023, 1:40 PM

## 2023-10-16 NOTE — Progress Notes (Addendum)
 PROGRESS NOTE   Lauren Morris  ZOX:[id number] DOB: 07-27-1931 DOA: 10/14/2023 PCP: Thersia Flax, MD   HPI was taken from Dr. Guss Legacy: Lauren Morris is a 89 y.o. female with medical history significant of recurrent Klebsiella UTI, type 2 diabetes, Graves' disease now with acquired hypothyroidism, hypertension, hyperlipidemia, OSA on CPAP, recurrent bronchitis, meningioma, who presents to the ED due to altered mental status.   History obtained from both Lauren Morris and her daughter at bedside.  Lauren Morris states that over the last 1 day, she has noticed increasing difficulty with walking, and this usually happens when she has an underlying UTI.  Her daughter at bedside states that yesterday, Lauren Morris slept all day, which is unusual for her.  Then she checked on her today at 8 AM, and she seemed close to her baseline.  When she checked on her again at approximately 5 or 6 PM, Lauren Morris was laying in her recliner, and still in her pajamas.  She has not gotten up or eaten all day.  She states that this is markedly unusual as Lauren Morris usually sticks to a schedule.  She seemed confused at the time but was still able to answer questions appropriately.  Lauren Morris denies any nausea, vomiting, chest pain, shortness of breath, palpitations, lower extremity swelling, cough, rhinorrhea, sinus congestion.  She denies any urinary symptoms at this time other than frequency, which she states is chronic.   ED course: On arrival to the ED, patient was hypotensive at 84/66 with heart rate of 87.  She was saturating at 92% on room air.  She was afebrile at 99.7.  She was tachypneic at 24/min.  Initial workup notable for WBC of 11.8, potassium 3.2, bicarb 21, creatinine 1.35, BUN 24, alkaline phosphatase 328, AST 97, ALT 69, total bilirubin 1.9, GFR 38.  Lactic acid 3.2.  Influenza, RSV and COVID-19 negative.  Chest x-ray and CT head obtained with results pending.  Patient given 2 L of IV fluids, Zosyn  and vancomycin .  [facility]  contacted for admission.    Assessment & Plan:   Principal Problem:   Septic shock (HCC) Active Problems:   UTI (urinary tract infection)   AKI (acute kidney injury) (HCC)   Essential hypertension   Acquired hypothyroidism   Type 2 diabetes mellitus (HCC)   Fatty liver disease, nonalcoholic   COPD (chronic obstructive pulmonary disease) (HCC)   OSA (obstructive sleep apnea)   Calculus of gallbladder without cholecystitis without obstruction  Assessment and Plan:  # Septic shock: met criteria w/ AMS, hypotension, tachypnea, leukocytosis, elevated lactic acid & questionable acute cholecystitis vs unlikely UTI.  Continue on IV rocephin  and start IV flagyl .   # Acute CVA: MRI showed right basal ganglia infarct, no hemorrhage or mass effect. TTE LVEF 60 to 65%, no WMA, grade 2 diastolic dysfunction, severe PAH, moderate left pleural effusion, no significant valvular abnormality. Continue w/ neuro checks. Neuro consulted, commended aspirin  and statin, no need of DAPT, patient may need surgical intervention.  Questionable cholecystitis: as per US  but no abd pain, nausea or vomiting. Likely a poor surgical candidate & pt does not want a percutaneous drain currently.  Continue on IV rocephin  and start IV flagyl .  Blood culture 1/2 growing staph species, d/w ID, rec repeat blood cultures Gen surg following and recs MRI of the liver, which is pending   Unlikely UTI: UA is positive for rare bacteria only.    AKI: likely secondary to sepsis  Cr 1.35---0.84 resolved  OSA: CPAP qhs   # Acute hypoxic respiratory failure, with pulmonary edema and bilateral pleural effusion, could be due to pneumonia CXR: Bilateral reticular interstitial infiltrates with a small bilateral pleural effusions left more than right. Left lower lobe atelectasis. Findings could correlate with congestive changes, pneumonia not excluded. Negative COVID, RSV and flu Continue above antibiotics Trend WBC  count Lactic acid 2.5--1.3 trended down 4/23 Lasix  80 mg x 1 dose given Continue DuoNeb as needed   Fatty liver disease: nonalcoholic. Hx of fatty liver disease. US  shows cirrhosis & likely acute cholecystitis   Transaminitis: likely secondary to NAFLD & cirrhosis. Will continue to monitor   DM2: well controlled, HbA1c 5.9. No need for SSI currently    Hypothyroidism: continue on home dose of levothyroxine    HTN: holding all home anti-HTN meds secondary to hypotension   # Metabolic acidosis, started bicarb 3 times daily for 2 days Monitor BMP   DVT prophylaxis: lovenox   Code Status: DNR Family Communication: discussed pt's care w/ pt's daughter at bedside and answered her questions  Disposition Plan: depends on PT/OT recs  Level of care: Progressive  Status is: Inpatient Remains inpatient appropriate because: severity of illness    Consultants:  Neuro Gen surg   Procedures:   Antimicrobials: rocephin , flagyl    Subjective: No significant overnight events, patient was resting comfortably on the recliner.  Mild shortness of breath and feels like it is on the right side.  No any other specific complaints  Objective: Vitals:   10/16/23 0047 10/16/23 0312 10/16/23 0814 10/16/23 1133  BP: 121/60 (!) 103/47 122/60 (!) 120/58  Pulse: 73 72 72 71  Resp: (!) 34 (!) 24 16 18   Temp: (!) 97.5 F (36.4 C) 97.7 F (36.5 C) 97.6 F (36.4 C) 97.6 F (36.4 C)  TempSrc:   Oral   SpO2: 97% 94% 99% 99%  Weight:      Height:        Intake/Output Summary (Last 24 hours) at 10/16/2023 1511 Last data filed at 10/16/2023 1036 Gross per 24 hour  Intake 423.83 ml  Output 1750 ml  Net -1326.17 ml   Filed Weights   10/14/23 1940  Weight: 76.2 kg    Examination:  General exam: Appears calm and comfortable  Respiratory system: Equal air entry bilaterally, bilateral crackles, no wheezes Cardiovascular system: S1 & S2+. No rubs, gallops or clicks.  Gastrointestinal system:  Abdomen is obese, soft and nontender. Normal bowel sounds heard. Central nervous system: Alert and awake.  Right-sided weakness power 4/5 no any other focal deficits  Psychiatry: Judgement and insight appears at baseline. Flat mood and affec     Data Reviewed: I have personally reviewed following labs and imaging studies  CBC: Recent Labs  Lab 10/14/23 1946 10/15/23 0425 10/16/23 0423  WBC 11.8* 16.7* 14.0*  NEUTROABS 10.0*  --   --   HGB 14.9 13.2 13.7  HCT 45.5 41.1 41.0  MCV 96.6 98.8 95.3  PLT 152 119* 134*   Basic Metabolic Panel: Recent Labs  Lab 10/14/23 1946 10/15/23 0425 10/16/23 0423  NA 140 138 137  K 3.2* 4.0 3.5  CL 108 112* 111  CO2 21* 18* 19*  GLUCOSE 144* 166* 109*  BUN 24* 24* 24*  CREATININE 1.35* 1.05* 0.84  CALCIUM  8.6* 7.7* 8.1*  MG  --  1.8 2.3  PHOS  --   --  3.0   GFR: Estimated Creatinine Clearance: 43.4 mL/min (by C-G formula based on SCr of 0.84 mg/dL).  Liver Function Tests: Recent Labs  Lab 10/14/23 1946 10/15/23 0425 10/16/23 0423  AST 97* 76* 42*  ALT 69* 60* 48*  ALKPHOS 328* 248* 200*  BILITOT 1.9* 1.6* 1.2  PROT 6.6 6.1* 6.0*  ALBUMIN  3.0* 2.6* 2.5*   No results for input(s): "LIPASE", "AMYLASE" in the last 168 hours. No results for input(s): "AMMONIA" in the last 168 hours. Coagulation Profile: Recent Labs  Lab 10/14/23 1946  INR 1.2   Cardiac Enzymes: No results for input(s): "CKTOTAL", "CKMB", "CKMBINDEX", "TROPONINI" in the last 168 hours. BNP (last 3 results) No results for input(s): "PROBNP" in the last 8760 hours. HbA1C: No results for input(s): "HGBA1C" in the last 72 hours. CBG: No results for input(s): "GLUCAP" in the last 168 hours. Lipid Profile: No results for input(s): "CHOL", "HDL", "LDLCALC", "TRIG", "CHOLHDL", "LDLDIRECT" in the last 72 hours. Thyroid  Function Tests: No results for input(s): "TSH", "T4TOTAL", "FREET4", "T3FREE", "THYROIDAB" in the last 72 hours. Anemia Panel: No results for  input(s): "VITAMINB12", "FOLATE", "FERRITIN", "TIBC", "IRON", "RETICCTPCT" in the last 72 hours. Sepsis Labs: Recent Labs  Lab 10/14/23 1950 10/14/23 2126 10/15/23 1507  LATICACIDVEN 3.2* 2.5* 1.3    Recent Results (from the past 240 hours)  Blood Culture (routine x 2)     Status: None (Preliminary result)   Collection Time: 10/14/23  7:42 PM   Specimen: BLOOD  Result Value Ref Range Status   Specimen Description   Final    BLOOD BLOOD RIGHT ARM Performed at [facility], [address], [place], [place] [address]    Special Requests   Final    BOTTLES DRAWN AEROBIC AND ANAEROBIC Blood Culture adequate volume Performed at [facility], [address] [place], [place] [address]    Culture  Setup Time   Final    GRAM POSITIVE COCCI AEROBIC BOTTLE ONLY CRITICAL RESULT CALLED TO, READ BACK BY AND VERIFIED WITH: JASON ROBBINS 10/16/23 0538 MW Performed at [facility], [address], [place], [place] [address]    Culture GRAM POSITIVE COCCI  Final   Report Status PENDING  Incomplete  Blood Culture ID Panel (Reflexed)     Status: Abnormal   Collection Time: 10/14/23  7:42 PM  Result Value Ref Range Status   Enterococcus faecalis NOT DETECTED NOT DETECTED Final   Enterococcus Faecium NOT DETECTED NOT DETECTED Final   Listeria monocytogenes NOT DETECTED NOT DETECTED Final   Staphylococcus species DETECTED (A) NOT DETECTED Final    Comment: CRITICAL RESULT CALLED TO, READ BACK BY AND VERIFIED WITH: JASON ROBBINS 10/16/23 0538 MW    Staphylococcus aureus (BCID) NOT DETECTED NOT DETECTED Final   Staphylococcus epidermidis NOT DETECTED NOT DETECTED Final   Staphylococcus lugdunensis NOT DETECTED NOT DETECTED Final   Streptococcus species NOT DETECTED NOT DETECTED Final   Streptococcus agalactiae NOT DETECTED NOT DETECTED Final   Streptococcus pneumoniae NOT DETECTED NOT DETECTED Final   Streptococcus pyogenes NOT DETECTED NOT DETECTED Final    A.calcoaceticus-baumannii NOT DETECTED NOT DETECTED Final   Bacteroides fragilis NOT DETECTED NOT DETECTED Final   Enterobacterales NOT DETECTED NOT DETECTED Final   Enterobacter cloacae complex NOT DETECTED NOT DETECTED Final   Escherichia coli NOT DETECTED NOT DETECTED Final   Klebsiella aerogenes NOT DETECTED NOT DETECTED Final   Klebsiella oxytoca NOT DETECTED NOT DETECTED Final   Klebsiella pneumoniae NOT DETECTED NOT DETECTED Final   Proteus species NOT DETECTED NOT DETECTED Final   Salmonella species NOT DETECTED NOT DETECTED Final  Serratia marcescens NOT DETECTED NOT DETECTED Final   Haemophilus influenzae NOT DETECTED NOT DETECTED Final   Neisseria meningitidis NOT DETECTED NOT DETECTED Final   Pseudomonas aeruginosa NOT DETECTED NOT DETECTED Final   Stenotrophomonas maltophilia NOT DETECTED NOT DETECTED Final   Candida albicans NOT DETECTED NOT DETECTED Final   Candida auris NOT DETECTED NOT DETECTED Final   Candida glabrata NOT DETECTED NOT DETECTED Final   Candida krusei NOT DETECTED NOT DETECTED Final   Candida parapsilosis NOT DETECTED NOT DETECTED Final   Candida tropicalis NOT DETECTED NOT DETECTED Final   Cryptococcus neoformans/gattii NOT DETECTED NOT DETECTED Final    Comment: Performed at [facility], [address] [place], [place] [address]  Blood Culture (routine x 2)     Status: None (Preliminary result)   Collection Time: 10/14/23  7:50 PM   Specimen: BLOOD  Result Value Ref Range Status   Specimen Description BLOOD BLOOD LEFT FOREARM  Final   Special Requests   Final    BOTTLES DRAWN AEROBIC AND ANAEROBIC Blood Culture adequate volume   Culture   Final    NO GROWTH 2 DAYS Performed at [facility], [address] [place], [place] [address]    Report Status PENDING  Incomplete  Resp panel by RT-PCR (RSV, Flu A&B, Covid) Anterior Nasal Swab     Status: None   Collection Time: 10/14/23  8:01 PM   Specimen: Anterior Nasal Swab   Result Value Ref Range Status   SARS Coronavirus 2 by RT PCR NEGATIVE NEGATIVE Final    Comment: (NOTE) SARS-CoV-2 target nucleic acids are NOT DETECTED.  The SARS-CoV-2 RNA is generally detectable in upper respiratory specimens during the acute phase of infection. The lowest concentration of SARS-CoV-2 viral copies this assay can detect is 138 copies/mL. A negative result does not preclude SARS-Cov-2 infection and should not be used as the sole basis for treatment or other patient management decisions. A negative result may occur with  improper specimen collection/handling, submission of specimen other than nasopharyngeal swab, presence of viral mutation(s) within the areas targeted by this assay, and inadequate number of viral copies(<138 copies/mL). A negative result must be combined with clinical observations, patient history, and epidemiological information. The expected result is Negative.  Fact Sheet for Patients:  BloggerCourse.com  Fact Sheet for Healthcare Providers:  SeriousBroker.it  This test is no t yet approved or cleared by the [place]  FDA and  has been authorized for detection and/or diagnosis of SARS-CoV-2 by FDA under an Emergency Use Authorization (EUA). This EUA will remain  in effect (meaning this test can be used) for the duration of the COVID-19 declaration under Section 564(b)(1) of the Act, 21 U.S.C.section 360bbb-3(b)(1), unless the authorization is terminated  or revoked sooner.       Influenza A by PCR NEGATIVE NEGATIVE Final   Influenza B by PCR NEGATIVE NEGATIVE Final    Comment: (NOTE) The Xpert Xpress SARS-CoV-2/FLU/RSV plus assay is intended as an aid in the diagnosis of influenza from Nasopharyngeal swab specimens and should not be used as a sole basis for treatment. Nasal washings and aspirates are unacceptable for Xpert Xpress SARS-CoV-2/FLU/RSV testing.  Fact Sheet for  Patients: BloggerCourse.com  Fact Sheet for Healthcare Providers: SeriousBroker.it  This test is not yet approved or cleared by the [place]  FDA and has been authorized for detection and/or diagnosis of SARS-CoV-2 by FDA under an Emergency Use Authorization (EUA). This EUA will remain in effect (meaning this test can be  used) for the duration of the COVID-19 declaration under Section 564(b)(1) of the Act, 21 U.S.C. section 360bbb-3(b)(1), unless the authorization is terminated or revoked.     Resp Syncytial Virus by PCR NEGATIVE NEGATIVE Final    Comment: (NOTE) Fact Sheet for Patients: BloggerCourse.com  Fact Sheet for Healthcare Providers: SeriousBroker.it  This test is not yet approved or cleared by the [place]  FDA and has been authorized for detection and/or diagnosis of SARS-CoV-2 by FDA under an Emergency Use Authorization (EUA). This EUA will remain in effect (meaning this test can be used) for the duration of the COVID-19 declaration under Section 564(b)(1) of the Act, 21 U.S.C. section 360bbb-3(b)(1), unless the authorization is terminated or revoked.  Performed at [facility], [address] [place], [place] [address]          Radiology Studies: ECHOCARDIOGRAM COMPLETE Result Date: 10/16/2023    ECHOCARDIOGRAM REPORT   Patient Name:   Lauren Morris Date of Exam: 10/15/2023 Medical Rec #:  [id number]   Height:       62.0 in Accession #:    [id number]  Weight:       167.9 lb Date of Birth:  Nov 10, 1931  BSA:          1.775 m Patient Age:    89 years    BP:           100/53 mmHg Patient Gender: F           HR:           79 bpm. Exam Location:  [facility] Procedure: 2D Echo, Cardiac Doppler and Color Doppler (Both Spectral and Color            Flow Doppler were utilized during procedure). Indications:     I63.9 Stroke  History:         Patient has prior  history of Echocardiogram examinations, most                  recent 08/04/2021. Risk Factors:Hypertension and Sleep Apnea.  Sonographer:     Brigid Canada RDCS Referring Phys:  [id number] MCNEILL P KIRKPATRICK Diagnosing Phys: Belva Boyden MD IMPRESSIONS  1. Left ventricular ejection fraction, by estimation, is 60 to 65%. The left ventricle has normal function. The left ventricle has no regional wall motion abnormalities. Left ventricular diastolic parameters are consistent with Grade II diastolic dysfunction (pseudonormalization).  2. Right ventricular systolic function is normal. The right ventricular size is normal. There is severely elevated pulmonary artery systolic pressure. The estimated right ventricular systolic pressure is 72.2 mmHg.  3. Moderate pleural effusion in the left lateral region.  4. The mitral valve is normal in structure. Mild mitral valve regurgitation. No evidence of mitral stenosis.  5. The aortic valve is normal in structure. There is mild calcification of the aortic valve. Aortic valve regurgitation is not visualized. Aortic valve sclerosis is present, with no evidence of aortic valve stenosis.  6. The inferior vena cava is normal in size with greater than 50% respiratory variability, suggesting right atrial pressure of 3 mmHg. FINDINGS  Left Ventricle: Left ventricular ejection fraction, by estimation, is 60 to 65%. The left ventricle has normal function. The left ventricle has no regional wall motion abnormalities. Strain was performed and the global longitudinal strain is indeterminate. The left ventricular internal cavity size was normal in size. There is no left ventricular hypertrophy. Left ventricular diastolic parameters are consistent with Grade II diastolic dysfunction (pseudonormalization). Right Ventricle: The right  ventricular size is normal. No increase in right ventricular wall thickness. Right ventricular systolic function is normal. There is severely elevated  pulmonary artery systolic pressure. The tricuspid regurgitant velocity is 4.10 m/s, and with an assumed right atrial pressure of 5 mmHg, the estimated right ventricular systolic pressure is 72.2 mmHg. Left Atrium: Left atrial size was normal in size. Right Atrium: Right atrial size was normal in size. Pericardium: There is no evidence of pericardial effusion. Mitral Valve: The mitral valve is normal in structure. Mild mitral annular calcification. Mild mitral valve regurgitation. No evidence of mitral valve stenosis. Tricuspid Valve: The tricuspid valve is normal in structure. Tricuspid valve regurgitation is mild . No evidence of tricuspid stenosis. The aortic valve is normal in structure. There is mild calcification of the aortic valve. Aortic valve regurgitation is not visualized. Aortic valve sclerosis is present, with no evidence of aortic valve stenosis. Pulmonic Valve: The pulmonic valve was normal in structure. Pulmonic valve regurgitation is not visualized. No evidence of pulmonic stenosis. Aorta: The aortic root is normal in size and structure. Venous: The inferior vena cava is normal in size with greater than 50% respiratory variability, suggesting right atrial pressure of 3 mmHg. IAS/Shunts: No atrial level shunt detected by color flow Doppler. Additional Comments: 3D was performed not requiring image post processing on an independent workstation and was indeterminate. There is a moderate pleural effusion in the left lateral region.  LEFT VENTRICLE PLAX 2D LVIDd:         3.60 cm   Diastology LVIDs:         2.00 cm   LV e' medial:    5.50 cm/s LV PW:         1.10 cm   LV E/e' medial:  20.5 LV IVS:        0.80 cm   LV e' lateral:   5.55 cm/s LVOT diam:     1.90 cm   LV E/e' lateral: 20.3 LV SV:         69 LV SV Index:   39 LVOT Area:     2.84 cm  RIGHT VENTRICLE            IVC RV Basal diam:  4.00 cm    IVC diam: 1.20 cm RV S prime:     9.79 cm/s TAPSE (M-mode): 1.8 cm LEFT ATRIUM             Index         RIGHT ATRIUM           Index LA diam:        4.00 cm 2.25 cm/m   RA Area:     12.70 cm LA Vol (A2C):   46.4 ml 26.15 ml/m  RA Volume:   30.40 ml  17.13 ml/m LA Vol (A4C):   47.7 ml 26.88 ml/m LA Biplane Vol: 47.3 ml 26.65 ml/m  AORTIC VALVE AV Area (Vmax):    1.84 cm AV Area (Vmean):   1.77 cm AV Area (VTI):     1.92 cm AV Vmax:           190.67 cm/s AV Vmean:          132.667 cm/s AV VTI:            0.361 m AV Peak Grad:      14.5 mmHg AV Mean Grad:      8.3 mmHg LVOT Vmax:         123.50 cm/s LVOT Vmean:  83.050 cm/s LVOT VTI:          0.244 m LVOT/AV VTI ratio: 0.68  AORTA Ao Root diam: 2.70 cm Ao Asc diam:  3.10 cm MITRAL VALVE                TRICUSPID VALVE MV Area (PHT): 3.50 cm     TR Peak grad:   67.2 mmHg MV Decel Time: 217 msec     TR Vmax:        410.00 cm/s MV E velocity: 112.50 cm/s MV A velocity: 100.50 cm/s  SHUNTS MV E/A ratio:  1.12         Systemic VTI:  0.24 m                             Systemic Diam: 1.90 cm Belva Boyden MD Electronically signed by Belva Boyden MD Signature Date/Time: 10/16/2023/7:15:03 AM    Final    DG Chest Port 1 View Result Date: 10/15/2023 CLINICAL DATA:  Dyspnea EXAM: PORTABLE CHEST 1 VIEW COMPARISON:  October 18, 2023 FINDINGS: Bilateral reticular interstitial infiltrates with a small bilateral pleural effusions left more than right. Left lower lobe atelectasis. Heart and mediastinum normal Findings could correlate with congestive changes, pneumonia not excluded. IMPRESSION: Bilateral reticular interstitial infiltrates with a small bilateral pleural effusions left more than right. Left lower lobe atelectasis. Findings could correlate with congestive changes, pneumonia not excluded. Electronically Signed   By: Fredrich Jefferson M.D.   On: 10/15/2023 14:25   MR BRAIN WO CONTRAST Result Date: 10/15/2023 CLINICAL DATA:  Altered mental status EXAM: MRI HEAD WITHOUT CONTRAST TECHNIQUE: Multiplanar, multiecho pulse sequences of the brain and surrounding  structures were obtained without intravenous contrast. COMPARISON:  08/02/2021 FINDINGS: Brain: Right basal ganglia acute infarct. No acute or chronic hemorrhage. There is multifocal hyperintense T2-weighted signal within the white matter. Parenchymal volume and CSF spaces are normal. The midline structures are normal. Unchanged right posterior fossa of meningioma. Vascular: Normal flow voids. Skull and upper cervical spine: Normal calvarium and skull base. Visualized upper cervical spine and soft tissues are normal. Sinuses/Orbits:No paranasal sinus fluid levels or advanced mucosal thickening. No mastoid or middle ear effusion. Normal orbits. IMPRESSION: 1. Right basal ganglia acute infarct. No hemorrhage or mass effect. 2. Unchanged right posterior fossa meningioma. Electronically Signed   By: Juanetta Nordmann M.D.   On: 10/15/2023 03:30   CT HEAD WO CONTRAST ( ) Addendum Date: 10/14/2023 ADDENDUM REPORT: 10/14/2023 23:41 ADDENDUM: These results were called by telephone at the time of interpretation on 10/14/2023 at 11:41 pm to provider MICHAEL MIAN , who verbally acknowledged these results. Electronically Signed   By: Morgane  Naveau M.D.   On: 10/14/2023 23:41   Result Date: 10/14/2023 CLINICAL DATA:  altered mental status EXAM: CT HEAD WITHOUT CONTRAST TECHNIQUE: Contiguous axial images were obtained from the base of the skull through the vertex without intravenous contrast. RADIATION DOSE REDUCTION: This exam was performed according to the departmental dose-optimization program which includes automated exposure control, adjustment of the mA and/or kV according to patient size and/or use of iterative reconstruction technique. COMPARISON:  CT head 11/12/2021 FINDINGS: Brain: Trace patchy and confluent areas of decreased attenuation are noted throughout the deep and periventricular white matter of the cerebral hemispheres bilaterally, compatible with chronic microvascular ischemic disease. Interval development  of right basal ganglia hypodensity with loss of gray-white matter differentiation and associated edema. No parenchymal hemorrhage. No mass lesion. No extra-axial  collection. No midline shift. No hydrocephalus. Basilar cisterns are patent. Vascular: No hyperdense vessel. Atherosclerotic calcifications are present within the cavernous internal carotid arteries. Skull: No acute fracture or focal lesion. Sinuses/Orbits: Paranasal sinuses and mastoid air cells are clear. Bilateral lens replacement. Otherwise the orbits are unremarkable. Other: None. IMPRESSION: 1. Acute to early subacute right basal ganglia infarction. Recommend MRI head noncontrast for further evaluation. 2. No intracranial hemorrhage. Electronically Signed: By: Morgane  Naveau M.D. On: 10/14/2023 23:32   US  ABDOMEN LIMITED RUQ (LIVER/GB) Result Date: 10/14/2023 CLINICAL DATA:  [id number] Transaminitis [id number] EXAM: ULTRASOUND ABDOMEN LIMITED RIGHT UPPER QUADRANT COMPARISON:  CT abdomen pelvis 08/02/2021 FINDINGS: Gallbladder: Cholelithiasis with associated gallbladder wall thickening and pericholecystic fluid. No sonographic Murphy sign noted by sonographer. Common bile duct: Diameter: 5 mm Liver: Nodular contour. No focal lesion identified. Within normal limits in parenchymal echogenicity. Portal vein is patent on color Doppler imaging with normal direction of blood flow towards the liver. Other: Trace right pleural effusion. IMPRESSION: 1. Cholelithiasis with findings suggestive of acute cholecystitis. 2. Cirrhosis. No focal liver lesions identified. Please note that liver protocol enhanced MR and CT are the most sensitive tests for the screening detection of hepatocellular carcinoma in the high risk setting of cirrhosis. 3. Trace right pleural effusion. Electronically Signed   By: Morgane  Naveau M.D.   On: 10/14/2023 23:27   DG Chest Port 1 View Result Date: 10/14/2023 CLINICAL DATA:  Questionable sepsis - evaluate for abnormality EXAM: PORTABLE  CHEST 1 VIEW COMPARISON:  Chest x-ray 11/08/2022, CT chest 02/08/2023 FINDINGS: The heart and mediastinal contours are unchanged. Atherosclerotic plaque. No focal consolidation. No pulmonary edema. Interval development of at least small left pleural effusion. Pleural effusion. No pneumothorax. No acute osseous abnormality. IMPRESSION: 1. Interval development of at least small left pleural effusion. Followup PA and lateral chest X-ray is recommended in 3-4 weeks following therapy to ensure resolution and exclude underlying malignancy. 2.  Aortic Atherosclerosis (ICD10-I70.0). Electronically Signed   By: Morgane  Naveau M.D.   On: 10/14/2023 23:25        Scheduled Meds:   stroke: early stages of recovery book   Does not apply Once   aspirin  EC  81 mg Oral Q0600   atorvastatin   40 mg Oral Daily   cycloSPORINE   1 drop Both Eyes BID   enoxaparin  (LOVENOX ) injection  40 mg Subcutaneous Q24H   famotidine   20 mg Oral BID   furosemide   80 mg Intravenous Once   midodrine   5 mg Oral TID WC   prednisoLONE  acetate  2 drop Left Eye QID   sodium bicarbonate   650 mg Oral TID   sodium chloride  flush  3 mL Intravenous Q12H   timolol   1 drop Both Eyes Daily   umeclidinium-vilanterol  1 puff Inhalation Daily   valACYclovir   500 mg Oral QHS   Continuous Infusions:  sodium chloride  20 mL/hr at 10/16/23 1318   cefTRIAXone  (ROCEPHIN )  IV 2 g (10/16/23 0435)   metronidazole  500 mg (10/16/23 1052)     LOS: 2 days    Total time spent: 55 minutes   Althia Atlas, MD [facility] Pager [phone]  If 7PM-7AM, please contact night-coverage [email or web address] 10/16/2023, 3:11 PM

## 2023-10-16 NOTE — Plan of Care (Signed)
   Problem: Respiratory: Goal: Ability to maintain adequate ventilation will improve Outcome: Progressing

## 2023-10-17 ENCOUNTER — Inpatient Hospital Stay

## 2023-10-17 DIAGNOSIS — K8020 Calculus of gallbladder without cholecystitis without obstruction: Secondary | ICD-10-CM | POA: Diagnosis not present

## 2023-10-17 DIAGNOSIS — R6521 Severe sepsis with septic shock: Secondary | ICD-10-CM | POA: Diagnosis not present

## 2023-10-17 DIAGNOSIS — A419 Sepsis, unspecified organism: Secondary | ICD-10-CM | POA: Diagnosis not present

## 2023-10-17 LAB — LACTATE DEHYDROGENASE, PLEURAL OR PERITONEAL FLUID: LD, Fluid: 87 U/L — ABNORMAL HIGH (ref 3–23)

## 2023-10-17 LAB — HEPATIC FUNCTION PANEL
ALT: 36 U/L (ref 0–44)
AST: 37 U/L (ref 15–41)
Albumin: 2.7 g/dL — ABNORMAL LOW (ref 3.5–5.0)
Alkaline Phosphatase: 172 U/L — ABNORMAL HIGH (ref 38–126)
Bilirubin, Direct: 0.4 mg/dL — ABNORMAL HIGH (ref 0.0–0.2)
Indirect Bilirubin: 0.5 mg/dL (ref 0.3–0.9)
Total Bilirubin: 0.9 mg/dL (ref 0.0–1.2)
Total Protein: 6 g/dL — ABNORMAL LOW (ref 6.5–8.1)

## 2023-10-17 LAB — BODY FLUID CELL COUNT WITH DIFFERENTIAL
Eos, Fluid: 0 %
Lymphs, Fluid: 52 %
Monocyte-Macrophage-Serous Fluid: 1 % — ABNORMAL LOW (ref 50–90)
Neutrophil Count, Fluid: 45 % — ABNORMAL HIGH (ref 0–25)
Other Cells, Fluid: 2 %
Total Nucleated Cell Count, Fluid: 2319 uL — ABNORMAL HIGH (ref 0–1000)

## 2023-10-17 LAB — RENAL FUNCTION PANEL
Albumin: 2.8 g/dL — ABNORMAL LOW (ref 3.5–5.0)
Anion gap: 12 (ref 5–15)
BUN: 24 mg/dL — ABNORMAL HIGH (ref 8–23)
CO2: 22 mmol/L (ref 22–32)
Calcium: 8.5 mg/dL — ABNORMAL LOW (ref 8.9–10.3)
Chloride: 106 mmol/L (ref 98–111)
Creatinine, Ser: 0.75 mg/dL (ref 0.44–1.00)
GFR, Estimated: >60 mL/min (ref >60)
Glucose, Bld: 88 mg/dL (ref 70–99)
Phosphorus: 1.9 mg/dL — ABNORMAL LOW (ref 2.5–4.6)
Potassium: 3.1 mmol/L — ABNORMAL LOW (ref 3.5–5.1)
Sodium: 140 mmol/L (ref 135–145)

## 2023-10-17 LAB — LACTATE DEHYDROGENASE: LDH: 134 U/L (ref 98–192)

## 2023-10-17 LAB — CBC
HCT: 39.4 % (ref 36.0–46.0)
Hemoglobin: 13.3 g/dL (ref 12.0–15.0)
MCH: 32 pg (ref 26.0–34.0)
MCHC: 33.8 g/dL (ref 30.0–36.0)
MCV: 94.7 fL (ref 80.0–100.0)
Platelets: 140 10*3/uL — ABNORMAL LOW (ref 150–400)
RBC: 4.16 MIL/uL (ref 3.87–5.11)
RDW: 14.3 % (ref 11.5–15.5)
WBC: 9.8 10*3/uL (ref 4.0–10.5)
nRBC: 0 % (ref 0.0–0.2)

## 2023-10-17 LAB — PROTEIN, PLEURAL OR PERITONEAL FLUID: Total protein, fluid: <3 g/dL

## 2023-10-17 LAB — MAGNESIUM: Magnesium: 2.1 mg/dL (ref 1.7–2.4)

## 2023-10-17 MED ORDER — FUROSEMIDE 10 MG/ML IJ SOLN
40.0000 mg | Freq: Two times a day (BID) | INTRAMUSCULAR | Status: DC
  Administered 2023-10-17 – 2023-10-19 (×5): 40 mg via INTRAVENOUS
  Filled 2023-10-17 (×5): qty 4

## 2023-10-17 MED ORDER — ENSURE ENLIVE PO LIQD
237.0000 mL | Freq: Three times a day (TID) | ORAL | Status: DC
  Administered 2023-10-17 – 2023-10-21 (×6): 237 mL via ORAL

## 2023-10-17 MED ORDER — POTASSIUM CHLORIDE CRYS ER 20 MEQ PO TBCR
40.0000 meq | EXTENDED_RELEASE_TABLET | Freq: Every day | ORAL | Status: DC
  Administered 2023-10-18 – 2023-10-19 (×2): 40 meq via ORAL
  Filled 2023-10-17 (×2): qty 2

## 2023-10-17 MED ORDER — POTASSIUM PHOSPHATES 15 MMOLE/5ML IV SOLN
30.0000 mmol | Freq: Once | INTRAVENOUS | Status: AC
  Administered 2023-10-17: 30 mmol via INTRAVENOUS
  Filled 2023-10-17: qty 10

## 2023-10-17 MED ORDER — LIDOCAINE HCL (PF) 1 % IJ SOLN
10.0000 mL | Freq: Once | INTRAMUSCULAR | Status: AC
  Administered 2023-10-17: 10 mL via INTRADERMAL

## 2023-10-17 MED ORDER — POTASSIUM CHLORIDE CRYS ER 20 MEQ PO TBCR: 40.0000 meq | EXTENDED_RELEASE_TABLET | Freq: Once | ORAL | Status: DC

## 2023-10-17 MED ORDER — POTASSIUM CHLORIDE CRYS ER 20 MEQ PO TBCR
20.0000 meq | EXTENDED_RELEASE_TABLET | Freq: Once | ORAL | Status: AC
  Administered 2023-10-17: 20 meq via ORAL
  Filled 2023-10-17: qty 1

## 2023-10-17 MED ORDER — AMOXICILLIN-POT CLAVULANATE 875-125 MG PO TABS
1.0000 | ORAL_TABLET | Freq: Two times a day (BID) | ORAL | Status: DC
  Administered 2023-10-17 – 2023-10-18 (×2): 1 via ORAL
  Filled 2023-10-17 (×2): qty 1

## 2023-10-17 NOTE — Progress Notes (Signed)
 Physical Therapy Treatment Patient Details Name: Lauren Morris MRN: [id number] DOB: 10/06/1931 Today's Date: 10/17/2023   History of Present Illness Pt is an 89 year old presenting to the ED with AMS, admitted with septic shock, cholecystitis, UTI, and with MRI of the brain obtained which demonstrated a small to moderate-sized subcortical stroke on the right. PMH significant for Klebsiella UTI, type 2 diabetes, Graves' disease now with acquired hypothyroidism, hypertension, hyperlipidemia, OSA on CPAP, recurrent bronchitis, meningioma    PT Comments  Upon arrival, pt was seated EOB with RN tech giving bath after incontinence episode of both bowl/urine. Pt was agreeable to session and extremely pleasant throughout. A and O x 4. Was able to stand and ambulate with assistance a short distance without desaturation of HR concern. Overall distance limited by fatigue. Acute PT will continue to follow ad progress per current POC.   If plan is discharge home, recommend the following: A little help with walking and/or transfers;A little help with bathing/dressing/bathroom;Assistance with cooking/housework;Assist for transportation;Help with stairs or ramp for entrance     Equipment Recommendations  Other (comment) (Defer to next level of care)       Precautions / Restrictions Precautions Precautions: Fall Recall of Precautions/Restrictions: Intact Restrictions Weight Bearing Restrictions Per Provider Order: No     Mobility  Bed Mobility  General bed mobility comments: pt has BM/urination in bed and was seated EOB with RN tech giving bath    Transfers Overall transfer level: Needs assistance Equipment used: Rolling walker (2 wheels) Transfers: Sit to/from Stand Sit to Stand: Min assist   Ambulation/Gait Ambulation/Gait assistance: Contact guard assist, Min assist Gait Distance (Feet): 40 Feet Assistive device: Rolling walker (2 wheels) Gait Pattern/deviations: Step-to pattern, Trunk flexed,  Decreased stance time - left, Decreased step length - right Gait velocity: decreased  General Gait Details: Pt was able to tolerte ambulation ~ 40 ft with RW without LOB. sao2 > 92% on rm air. distance limited by fatigue    Balance Overall balance assessment: Needs assistance Sitting-balance support: Feet supported Sitting balance-Leahy Scale: Fair     Standing balance support: Bilateral upper extremity supported, During functional activity, Reliant on assistive device for balance Standing balance-Leahy Scale: Fair    Hotel manager: No apparent difficulties  Cognition Arousal: Alert Behavior During Therapy: WFL for tasks assessed/performed   PT - Cognitive impairments: No apparent impairments    PT - Cognition Comments: pt is A and O x 4. on 2 L o2 but discontinued throughout session with sao2 > 92%. RN aware pt did not desaturate on rm air Following commands: Intact      Cueing Cueing Techniques: Verbal cues, Tactile cues, Visual cues     General Comments General comments (skin integrity, edema, etc.): lengthy discussion with pt/pt's daughter about rehab placement. TOC made awre of pt's daughter's request for update and their request for [facility] [place]. pt has been to [facility] 2 x in past      Pertinent Vitals/Pain Pain Assessment Pain Assessment: No/denies pain     PT Goals (current goals can now be found in the care plan section) Acute Rehab PT Goals Patient Stated Goal: To get strong enough to return home Progress towards PT goals: Progressing toward goals    Frequency    Min 3X/week       AM-PAC PT "6 Clicks" Mobility   Outcome Measure  Help needed turning from your back to your side while in a flat bed without using  bedrails?: A Little Help needed moving from lying on your back to sitting on the side of a flat bed without using bedrails?: A Little Help needed moving to and from a bed to a chair (including a  wheelchair)?: A Little Help needed standing up from a chair using your arms (e.g., wheelchair or bedside chair)?: A Little Help needed to walk in hospital room?: A Little Help needed climbing 3-5 steps with a railing? : A Lot 6 Click Score: 17    End of Session Equipment Utilized During Treatment: Oxygen  (Able to wean to rm air during session. RN staff aware) Activity Tolerance: Patient tolerated treatment well;Patient limited by fatigue Patient left: in chair;with call bell/phone within reach;with chair alarm set;with family/visitor present Nurse Communication: Mobility status PT Visit Diagnosis: Difficulty in walking, not elsewhere classified (R26.2);Muscle weakness (generalized) (M62.81);Hemiplegia and hemiparesis Hemiplegia - Right/Left: Left Hemiplegia - dominant/non-dominant: Non-dominant     Time: 1610-9604 PT Time Calculation (min) (ACUTE ONLY): 21 min  Charges:    $Gait Training: 8-22 mins PT General Charges $$ ACUTE PT VISIT: 1 Visit                     Chester Costa PTA 10/17/23, 4:56 PM

## 2023-10-17 NOTE — Progress Notes (Signed)
 US  tech contacted me after procedure to report O2 desat below 80%. This NP to bedside to reassess.  Improved pleth with equipment change and position change. She is however requiring 2L [place] to keep sats above 90% after thora (started 95% on RA). Otherwise, no significant change in clinical picture: WOB unchanged, HR and BP stable.  Post procedure CXR reviewed by Dr. Julietta Ogren, no pneumo.  No hematoma or leakage at site of puncture.  Will notify team of current O2 requirement.  Terressa Fess, NP

## 2023-10-17 NOTE — Progress Notes (Signed)
   10/17/23 0343  Glasgow Coma Scale  Eye Opening 4  Best Verbal Response (NON-intubated) 5  Best Motor Response 6  Glasgow Coma Scale Score 15  NIH Stroke Scale   Dizziness Present No  Headache Present No  Interval Other (Comment)  Level of Consciousness (1a.)    0  LOC Questions (1b. )    0  LOC Commands (1c. )    0  Best Gaze (2. )   0  Visual (3. )   0  Facial Palsy (4. )     0  Motor Arm, Left (5a. )    1  Motor Arm, Right (5b. )  0  Motor Leg, Left (6a. )   0  Motor Leg, Right (6b. )  0  Limb Ataxia (7. ) 0  Sensory (8. )   0  Best Language (9. )   0  Dysarthria (10. ) 0  Extinction/Inattention (11.)    0  Complete NIHSS TOTAL 1   Plan of care is reviewed. Pt is progressing. She is stable hemodynamically, afebrile, NSR on the monitor, NIHSS per documented above, on 3 LPM of O2 NCL, normal respiratory effort. She is able to sleep well, no obvious acute distress noted overnight. No major complaints. We will continue to monitor.  Brain Cahill, RN

## 2023-10-17 NOTE — TOC Initial Note (Signed)
 Transition of Care [facility]) - Initial/Assessment Note    Patient Details  Name: Lauren Morris MRN: [id number] Date of Birth: 1931/08/23  [facility]) CM/SW Contact:    Odilia Bennett, LCSW Phone Number: 10/17/2023, 4:30 PM  Clinical Narrative:  Patient/family not in room. CSW left voicemail for daughter. Per PT, patient and family are hoping for [facility] because she has been there in the past. CSW sent referral and left message for admissions coordinator asking her to review. When daughter calls back, will confirm patient has CPAP at home and see if she has other preferences if [facility] cannot offer.                Expected Discharge Plan: Skilled Nursing Facility Barriers to Discharge: Continued Medical Work up   Patient Goals and CMS Choice            Expected Discharge Plan and Services     Post Acute Care Choice: Skilled Nursing Facility Living arrangements for the past 2 months: Single Family Home                                      Prior Living Arrangements/Services Living arrangements for the past 2 months: Single Family Home Lives with:: Self Patient language and need for interpreter reviewed:: Yes        Need for Family Participation in Patient Care: Yes (Comment)     Criminal Activity/Legal Involvement Pertinent to Current Situation/Hospitalization: No - Comment as needed  Activities of Daily Living   ADL Screening (condition at time of admission) Independently performs ADLs?: No Does the patient have a NEW difficulty with bathing/dressing/toileting/self-feeding that is expected to last >3 days?: Yes (Initiates electronic notice to provider for possible OT consult) Does the patient have a NEW difficulty with getting in/out of bed, walking, or climbing stairs that is expected to last >3 days?: Yes (Initiates electronic notice to provider for possible PT consult) Does the patient have a NEW difficulty with communication that is  expected to last >3 days?: No Is the patient deaf or have difficulty hearing?: No Does the patient have difficulty seeing, even when wearing glasses/contacts?: No Does the patient have difficulty concentrating, remembering, or making decisions?: No  Permission Sought/Granted                  Emotional Assessment       Orientation: : Oriented to Self, Oriented to Place, Oriented to  Time, Oriented to Situation Alcohol  / Substance Use: Not Applicable Psych Involvement: No (comment)  Admission diagnosis:  Septic shock (HCC) [A41.9, R65.21] Sepsis (HCC) [A41.9] Severe sepsis (HCC) [A41.9, R65.20] Cerebrovascular accident (CVA), unspecified mechanism (HCC) [I63.9] Patient Active Problem List   Diagnosis Date Noted   Calculus of gallbladder without cholecystitis without obstruction 10/15/2023   Septic shock (HCC) 10/14/2023   OSA (obstructive sleep apnea) 10/14/2023   AKI (acute kidney injury) (HCC) 10/14/2023   Maxillary sinusitis, acute 09/10/2023   Viral URI 09/02/2023   Chronic bronchitis, simple (HCC) 11/27/2022   UTI due to Klebsiella species 11/11/2022   SIRS (systemic inflammatory response syndrome) (HCC) 10/10/2022   Elevated liver enzymes 10/10/2022   Graves disease 02/16/2022   UTI (urinary tract infection) 11/12/2021   Dizziness 08/03/2021   Do not resuscitate status 08/03/2021   Musculoskeletal back pain 08/03/2021   Meningioma (HCC) 08/03/2021   Weakness 08/02/2021   Vertigo  08/02/2021   Hospital discharge follow-up 12/12/2020   Personal history of PE (pulmonary embolism) 12/12/2020   Obesity, diabetes, and hypertension syndrome (HCC) 11/24/2020   COPD (chronic obstructive pulmonary disease) (HCC) 01/16/2020   Abnormal EKG 07/07/2019   Generalized weakness 07/07/2019   H/O: hypothyroidism 07/07/2019   Abnormal chest CT 05/25/2016   Fatty liver disease, nonalcoholic 10/09/2014   Type 2 diabetes mellitus (HCC) 08/04/2012   Hyperlipidemia LDL goal <100  04/15/2011   Essential hypertension 02/22/2011   Acquired hypothyroidism 02/22/2011   PCP:  Thersia Flax, MD Pharmacy:   [facility] - [place], [place] - [address] [place] [address] Phone: [phone] Fax: [phone]     Social Drivers of Health (SDOH) Social History: SDOH Screenings   Food Insecurity: No Food Insecurity (10/15/2023)  Housing: Low Risk  (10/15/2023)  Transportation Needs: No Transportation Needs (10/15/2023)  Utilities: Not At Risk (10/15/2023)  Depression (PHQ2-9): Low Risk  (09/10/2023)  Social Connections: Moderately Integrated (10/15/2023)  Tobacco Use: Low Risk  (10/15/2023)   SDOH Interventions:     Readmission Risk Interventions     No data to display

## 2023-10-17 NOTE — Progress Notes (Signed)
  [facility] PROGRESS NOTE (cpt [phone])  Hospital Day(s): 3.   Interval History: Patient seen and examined, no acute events or new complaints overnight. Patient reports she is doing well. Got a code night sleep. No abdominal pain. No fever, chills, nausea, emesis. Leukocytosis normalized; WBC 9.8K this AM. Hgb to 13.3. Renal function normalized; sCr - 0.75; UO - 2050 ccs. Hypokalemia to 3.1. Bilirubin normalized to 0.9. LFTs improved as well. She is on Rocephin /Flagyl . Regular diet; tolerating small amounts. + BM. She did have MRI Liver which showed known cirrhosis and cholelithiasis without gross evidence of cholecystitis nor choledocholithiasis.   Review of Systems:  Constitutional: denies fever, chills  HEENT: denies cough or congestion  Respiratory: denies any shortness of breath  Cardiovascular: denies chest pain or palpitations  Gastrointestinal: denies abdominal pain, N/V Genitourinary: denies burning with urination or urinary frequency Musculoskeletal: denies pain, decreased motor or sensation  Vital signs in last 24 hours: [min-max] current  Temp:  [97.6 F (36.4 C)-98.3 F (36.8 C)] 98.3 F (36.8 C) (04/24 0331) Pulse Rate:  [71-87] 87 (04/24 0331) Resp:  [16-20] 18 (04/24 0331) BP: (104-150)/(50-64) 122/61 (04/24 0331) SpO2:  [95 %-99 %] 95 % (04/24 0331)     Height: 5\' 2"  (157.5 cm) Weight: 76.2 kg BMI (Calculated): 30.7   Intake/Output last 2 shifts:  04/23 0701 - 04/24 0700 In: 1067.7 [P.O.:370; I.V.:380.8; IV Piggyback:316.9] Out: 2050 [Urine:2050]   Physical Exam:  Constitutional: alert, cooperative and no distress, sitting up in bed; tolerating breakfast  HENT: normocephalic without obvious abnormality  Eyes: PERRL, EOM's grossly intact and symmetric  Respiratory: breathing non-labored at rest  Cardiovascular: regular rate and sinus rhythm  Gastrointestinal: soft, non-tender, and non-distended, no rebound/guarding. Murphy Sign negative     Labs:     Latest Ref Rng & Units 10/17/2023    4:12 AM 10/16/2023    4:11 PM 10/16/2023    4:23 AM  CBC  WBC 4.0 - 10.5 K/uL 9.8  11.7  14.0   Hemoglobin 12.0 - 15.0 g/dL 60.4  54.0  98.1   Hematocrit 36.0 - 46.0 % 39.4  39.8  41.0   Platelets 150 - 400 K/uL 140  131  134       Latest Ref Rng & Units 10/17/2023    4:13 AM 10/17/2023    4:12 AM 10/16/2023    4:23 AM  CMP  Glucose 70 - 99 mg/dL 88   191   BUN 8 - 23 mg/dL 24   24   Creatinine 4.78 - 1.00 mg/dL 2.95   6.21   Sodium 308 - 145 mmol/L 140   137   Potassium 3.5 - 5.1 mmol/L 3.1   3.5   Chloride 98 - 111 mmol/L 106   111   CO2 22 - 32 mmol/L 22   19   Calcium  8.9 - 10.3 mg/dL 8.5   8.1   Total Protein 6.5 - 8.1 g/dL  6.0  6.0   Total Bilirubin 0.0 - 1.2 mg/dL  0.9  1.2   Alkaline Phos 38 - 126 U/L  172  200   AST 15 - 41 U/L  37  42   ALT 0 - 44 U/L  36  48     Imaging studies:   MRI Liver (10/16/2023) personally reviewed and agree with radiologist report reviewed below:  IMPRESSION: 1. Cirrhosis. No focal liver lesions. Mild splenomegaly and trace ascites. 2. Cholelithiasis. Mildly distended gallbladder with trace pericholecystic fluid, nonspecific in  the setting of ascites. No significant gallbladder wall thickening. No biliary ductal dilatation nor evidence of choledocholithiasis. 3. Severe aortic atherosclerosis. 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 4. Moderate bilateral pleural effusions.     Assessment/Plan: (ICD-10's: K72.20) 89 y.o. female presenting with AMS found to have acute right basal ganglia CVA also with cholelithiasis and NAFLD with chronic LFT elevation, complicated by advanced age, comorbid conditions   - MRI this morning showed changes consistent with cirrhosis, mild ascites. Again, with cholelithiasis and changes consistent with known liver disease without overt evidence of  cholecystitis nor choledocholithiasis. Continue to have low suspicion for cholecystitis and no indication for procedures. Patient continues to state she would not want surgery or cholecystostomy regardless.   - Continue diet as tolerated - no issues  - Continue IV Abx; I do think she can complete 7 days total out of abundance of caution   - Monitor abdominal examination    - Monitor leukocytosis; normalized             - Further management per primary service   - Nothing further at this time from surgical perspective, we will be happy to remain available as needed and be a resource for patient and family in outpatient setting as well.   All of the above findings and recommendations were discussed with the patient, patient's family(daughter at bedside), and the medical team, and all of patient's and family's questions were answered to their expressed satisfaction.  -- Apolonio Bay, PA-C [facility] 10/17/2023, 7:28 AM M-F: 7am - 4pm

## 2023-10-17 NOTE — [place] FL2 (Signed)
 [place]  MEDICAID FL2 LEVEL OF CARE FORM     IDENTIFICATION  Patient Name: Lauren Morris Birthdate: June 26, 1931 Sex: female Admission Date (Current Location): 10/14/2023  [facility] and [place] Number:  Chiropodist and Address:  [facility], [address] [place], [place] [address]      Provider Number: [id number]  Attending Physician Name and Address:  Althia Atlas, MD  Relative Name and Phone Number:       Current Level of Care: Hospital Recommended Level of Care: Skilled Nursing Facility Prior Approval Number:    Date Approved/Denied:   PASRR Number: [id number]  Discharge Plan: SNF    Current Diagnoses: Patient Active Problem List   Diagnosis Date Noted   Calculus of gallbladder without cholecystitis without obstruction 10/15/2023   Septic shock (HCC) 10/14/2023   OSA (obstructive sleep apnea) 10/14/2023   AKI (acute kidney injury) (HCC) 10/14/2023   Maxillary sinusitis, acute 09/10/2023   Viral URI 09/02/2023   Chronic bronchitis, simple (HCC) 11/27/2022   UTI due to Klebsiella species 11/11/2022   SIRS (systemic inflammatory response syndrome) (HCC) 10/10/2022   Elevated liver enzymes 10/10/2022   Graves disease 02/16/2022   UTI (urinary tract infection) 11/12/2021   Dizziness 08/03/2021   Do not resuscitate status 08/03/2021   Musculoskeletal back pain 08/03/2021   Meningioma (HCC) 08/03/2021   Weakness 08/02/2021   Vertigo 08/02/2021   Hospital discharge follow-up 12/12/2020   Personal history of PE (pulmonary embolism) 12/12/2020   Obesity, diabetes, and hypertension syndrome (HCC) 11/24/2020   COPD (chronic obstructive pulmonary disease) (HCC) 01/16/2020   Abnormal EKG 07/07/2019   Generalized weakness 07/07/2019   H/O: hypothyroidism 07/07/2019   Abnormal chest CT 05/25/2016   Fatty liver disease, nonalcoholic 10/09/2014   Type 2 diabetes mellitus (HCC) 08/04/2012   Hyperlipidemia LDL goal <100 04/15/2011    Essential hypertension 02/22/2011   Acquired hypothyroidism 02/22/2011    Orientation RESPIRATION BLADDER Height & Weight     Self, Time, Situation, Place  Normal, Other (Comment) (Bipap discontinued on 4/22. Per notes, has CPAP at home.) Incontinent, External catheter Weight: 167 lb 14.4 oz (76.2 kg) Height:  5\' 2"  (157.5 cm)  BEHAVIORAL SYMPTOMS/MOOD NEUROLOGICAL BOWEL NUTRITION STATUS   (None)  (None) Incontinent Diet (Regular)  AMBULATORY STATUS COMMUNICATION OF NEEDS Skin   Limited Assist Verbally Bruising                       Personal Care Assistance Level of Assistance  Bathing, Feeding, Dressing Bathing Assistance: Maximum assistance Feeding assistance: Limited assistance Dressing Assistance: Maximum assistance     Functional Limitations Info  Sight, Hearing, Speech Sight Info: Adequate Hearing Info: Adequate Speech Info: Adequate    SPECIAL CARE FACTORS FREQUENCY  PT (By licensed PT), OT (By licensed OT)     PT Frequency: 5 x week OT Frequency: 5 x week            Contractures Contractures Info: Not present    Additional Factors Info  Code Status, Allergies Code Status Info: DNR Allergies Info: NKDA           Current Medications (10/17/2023):  This is the current hospital active medication list Current Facility-Administered Medications  Medication Dose Route Frequency Provider Last Rate Last Admin    stroke: early stages of recovery book   Does not apply Once Augustin Leber, MD       acetaminophen  (TYLENOL ) tablet 650 mg  650 mg Oral Q6H PRN Guss Legacy,  Evelene Hint, MD       Or   acetaminophen  (TYLENOL ) suppository 650 mg  650 mg Rectal Q6H PRN Basaraba, Iulia, MD       amoxicillin -clavulanate (AUGMENTIN ) 875-125 MG per tablet 1 tablet  1 tablet Oral Q12H Althia Atlas, MD       aspirin  EC tablet 81 mg  81 mg Oral Q0600 Avi Body, MD   81 mg at 10/17/23 0536   atorvastatin  (LIPITOR) tablet 40 mg  40 mg Oral Daily Kirkpatrick, McNeill P,  MD   40 mg at 10/16/23 2221   cycloSPORINE  (RESTASIS ) 0.05 % ophthalmic emulsion 1 drop  1 drop Both Eyes BID Avi Body, MD   1 drop at 10/17/23 0912   enoxaparin  (LOVENOX ) injection 40 mg  40 mg Subcutaneous Q24H Ananias Balls, RPH   40 mg at 10/16/23 2221   famotidine  (PEPCID ) tablet 20 mg  20 mg Oral BID Basaraba, Iulia, MD   20 mg at 10/17/23 0912   furosemide  (LASIX ) injection 40 mg  40 mg Intravenous Q12H Althia Atlas, MD   40 mg at 10/17/23 1108   ipratropium-albuterol  (DUONEB) 0.5-2.5 (3) MG/3ML nebulizer solution 3 mL  3 mL Nebulization Q4H PRN Avi Body, MD   3 mL at 10/15/23 2057   midodrine  (PROAMATINE ) tablet 5 mg  5 mg Oral TID WC Althia Atlas, MD   5 mg at 10/17/23 1109   morphine  (PF) 2 MG/ML injection 2 mg  2 mg Intravenous Q4H PRN Gideon Kussmaul, NP   2 mg at 10/16/23 0120   ondansetron  (ZOFRAN ) tablet 4 mg  4 mg Oral Q6H PRN Avi Body, MD       Or   ondansetron  (ZOFRAN ) injection 4 mg  4 mg Intravenous Q6H PRN Basaraba, Iulia, MD       polyethylene glycol (MIRALAX  / GLYCOLAX ) packet 17 g  17 g Oral Daily PRN Basaraba, Iulia, MD       Cecily Cohen ON 10/18/2023] potassium chloride  SA (KLOR-CON  M) CR tablet 40 mEq  40 mEq Oral Daily Althia Atlas, MD       prednisoLONE  acetate (PRED FORTE ) 1 % ophthalmic suspension 2 drop  2 drop Left Eye BID Althia Atlas, MD   2 drop at 10/17/23 0919   sodium bicarbonate  tablet 650 mg  650 mg Oral TID Althia Atlas, MD   650 mg at 10/17/23 0912   sodium chloride  (OCEAN) 0.65 % nasal spray 1 spray  1 spray Each Nare PRN Pabon, Diego F, MD       sodium chloride  flush (NS) 0.9 % injection 3 mL  3 mL Intravenous Q12H Avi Body, MD   3 mL at 10/17/23 9528   timolol  (TIMOPTIC ) 0.5 % ophthalmic solution 1 drop  1 drop Both Eyes Daily Avi Body, MD   1 drop at 10/17/23 0919   umeclidinium-vilanterol (ANORO ELLIPTA ) 62.5-25 MCG/ACT 1 puff  1 puff Inhalation Daily Avi Body, MD   1 puff at 10/17/23 4132    valACYclovir  (VALTREX ) tablet 500 mg  500 mg Oral QHS Basaraba, Iulia, MD   500 mg at 10/16/23 2222     Discharge Medications: Please see discharge summary for a list of discharge medications.  Relevant Imaging Results:  Relevant Lab Results:   Additional Information SS#: [id number]  Odilia Bennett, LCSW

## 2023-10-17 NOTE — Plan of Care (Signed)
  Problem: Fluid Volume: Goal: Hemodynamic stability will improve Outcome: Progressing   Problem: Clinical Measurements: Goal: Diagnostic test results will improve Outcome: Progressing Goal: Signs and symptoms of infection will decrease Outcome: Progressing   Problem: Respiratory: Goal: Ability to maintain adequate ventilation will improve Outcome: Progressing   Problem: Education: Goal: Knowledge of disease or condition will improve Outcome: Progressing Goal: Knowledge of secondary prevention will improve (MUST DOCUMENT ALL) Outcome: Progressing Goal: Knowledge of patient specific risk factors will improve (DELETE if not current risk factor) Outcome: Progressing   Problem: Ischemic Stroke/TIA Tissue Perfusion: Goal: Complications of ischemic stroke/TIA will be minimized Outcome: Progressing   Problem: Coping: Goal: Will verbalize positive feelings about self Outcome: Progressing Goal: Will identify appropriate support needs Outcome: Progressing   Problem: Health Behavior/Discharge Planning: Goal: Ability to manage health-related needs will improve Outcome: Progressing Goal: Goals will be collaboratively established with patient/family Outcome: Progressing   Problem: Self-Care: Goal: Ability to participate in self-care as condition permits will improve Outcome: Progressing Goal: Verbalization of feelings and concerns over difficulty with self-care will improve Outcome: Progressing Goal: Ability to communicate needs accurately will improve Outcome: Progressing   Problem: Nutrition: Goal: Risk of aspiration will decrease Outcome: Progressing Goal: Dietary intake will improve Outcome: Progressing   Problem: Education: Goal: Knowledge of General Education information will improve Description: Including pain rating scale, medication(s)/side effects and non-pharmacologic comfort measures Outcome: Progressing   Problem: Health Behavior/Discharge Planning: Goal:  Ability to manage health-related needs will improve Outcome: Progressing   Problem: Clinical Measurements: Goal: Ability to maintain clinical measurements within normal limits will improve Outcome: Progressing Goal: Will remain free from infection Outcome: Progressing Goal: Diagnostic test results will improve Outcome: Progressing Goal: Respiratory complications will improve Outcome: Progressing Goal: Cardiovascular complication will be avoided Outcome: Progressing   Problem: Activity: Goal: Risk for activity intolerance will decrease Outcome: Progressing   Problem: Nutrition: Goal: Adequate nutrition will be maintained Outcome: Progressing   Problem: Coping: Goal: Level of anxiety will decrease Outcome: Progressing   Problem: Elimination: Goal: Will not experience complications related to bowel motility Outcome: Progressing Goal: Will not experience complications related to urinary retention Outcome: Progressing   Problem: Pain Managment: Goal: General experience of comfort will improve and/or be controlled Outcome: Progressing   Problem: Safety: Goal: Ability to remain free from injury will improve Outcome: Progressing   Problem: Skin Integrity: Goal: Risk for impaired skin integrity will decrease Outcome: Progressing

## 2023-10-17 NOTE — Progress Notes (Signed)
 PROGRESS NOTE   Lauren Morris  ZOX:[id number] DOB: 12-19-1931 DOA: 10/14/2023 PCP: Thersia Flax, MD   HPI was taken from Dr. Guss Legacy: Lauren Morris is a 89 y.o. female with medical history significant of recurrent Klebsiella UTI, type 2 diabetes, Graves' disease now with acquired hypothyroidism, hypertension, hyperlipidemia, OSA on CPAP, recurrent bronchitis, meningioma, who presents to the ED due to altered mental status.   History obtained from both Lauren Morris and her daughter at bedside.  Lauren Morris states that over the last 1 day, she has noticed increasing difficulty with walking, and this usually happens when she has an underlying UTI.  Her daughter at bedside states that yesterday, Lauren Morris slept all day, which is unusual for her.  Then she checked on her today at 8 AM, and she seemed close to her baseline.  When she checked on her again at approximately 5 or 6 PM, Lauren Morris was laying in her recliner, and still in her pajamas.  She has not gotten up or eaten all day.  She states that this is markedly unusual as Lauren Morris usually sticks to a schedule.  She seemed confused at the time but was still able to answer questions appropriately.  Lauren Morris denies any nausea, vomiting, chest pain, shortness of breath, palpitations, lower extremity swelling, cough, rhinorrhea, sinus congestion.  She denies any urinary symptoms at this time other than frequency, which she states is chronic.   ED course: On arrival to the ED, patient was hypotensive at 84/66 with heart rate of 87.  She was saturating at 92% on room air.  She was afebrile at 99.7.  She was tachypneic at 24/min.  Initial workup notable for WBC of 11.8, potassium 3.2, bicarb 21, creatinine 1.35, BUN 24, alkaline phosphatase 328, AST 97, ALT 69, total bilirubin 1.9, GFR 38.  Lactic acid 3.2.  Influenza, RSV and COVID-19 negative.  Chest x-ray and CT head obtained with results pending.  Patient given 2 L of IV fluids, Zosyn  and vancomycin .  TRH  contacted for admission.    Assessment & Plan:   Principal Problem:   Septic shock (HCC) Active Problems:   UTI (urinary tract infection)   AKI (acute kidney injury) (HCC)   Essential hypertension   Acquired hypothyroidism   Type 2 diabetes mellitus (HCC)   Fatty liver disease, nonalcoholic   COPD (chronic obstructive pulmonary disease) (HCC)   OSA (obstructive sleep apnea)   Calculus of gallbladder without cholecystitis without obstruction  Assessment and Plan:  # Septic shock: met criteria w/ AMS, hypotension, tachypnea, leukocytosis, elevated lactic acid & questionable acute cholecystitis vs unlikely UTI.  S/p IV rocephin  and IV flagyl .  Transition to oral antibiotics Augmentin  twice daily to complete 7 days course empirically.  No source of infection identified.  # Acute CVA: MRI showed right basal ganglia infarct, no hemorrhage or mass effect. TTE LVEF 60 to 65%, no WMA, grade 2 diastolic dysfunction, severe PAH, moderate left pleural effusion, no significant valvular abnormality. Continue w/ neuro checks. Neuro consulted, commended aspirin  and statin, no need of DAPT, patient may need surgical intervention.  Cholecystitis ruled out.  As per US  but no abd pain, nausea or vomiting. Likely a poor surgical candidate & pt does not want a percutaneous drain currently.  Continue on IV rocephin  and start IV flagyl .  Blood culture 1/2 growing staph species, d/w ID, rec repeat blood cultures Gen surg following and recs MRI of the liver, which is negative for cholecystitis.  Showed  ascites and bilateral pleural effusions.  # Acute hypoxic respiratory failure, with pulmonary edema and bilateral pleural effusion, could be due to pneumonia CXR: Bilateral reticular interstitial infiltrates with a small bilateral pleural effusions left more than right. Left lower lobe atelectasis. Findings could correlate with congestive changes, pneumonia not excluded. Negative COVID, RSV and  flu Continue above antibiotics Trend WBC count Lactic acid 2.5--1.3 trended down 4/23 Lasix  80 mg x 1 dose given Continue DuoNeb as needed 4/24 bilateral pleural effusion, IR consulted for thoracentesis, s/p left thoracentesis 650 mL fluid was tapped.  Partially exudative due to high LDH and neutrophils Follow-up fluid culture 4/24 started Lasix  40 mg IV twice daily   # Hypokalemia, potassium repleted.  # Hypophosphatemia, Phos repleted. Monitor electrolytes and replete as needed.  Unlikely UTI: UA is positive for rare bacteria only.    AKI: likely secondary to sepsis  Cr 1.35---0.84 resolved  OSA: CPAP qhs   Liver cirrhosis with ascites, nonalcoholic. Hx of fatty liver disease. US  shows cirrhosis & likely acute cholecystitis  MRI positive for cirrhosis, ascites and bilateral effusion  Transaminitis: likely secondary to NAFLD & cirrhosis. Will continue to monitor   DM2: well controlled, HbA1c 5.9. No need for SSI currently    Hypothyroidism: continue on home dose of levothyroxine    HTN: holding all home anti-HTN meds secondary to hypotension   # Metabolic acidosis, started bicarb 3 times daily for 2 days Monitor BMP   DVT prophylaxis: lovenox   Code Status: DNR Family Communication: discussed pt's care w/ pt's daughter at bedside and answered her questions  Disposition Plan: depends on PT/OT recs  Level of care: Progressive  Status is: Inpatient Remains inpatient appropriate because: severity of illness    Consultants:  Neuro Gen surg   Procedures:   Antimicrobials: rocephin , flagyl    Subjective: No significant overnight events, patient was resting comfortably on the recliner.  Mild shortness of breath and feels like it is on the right side.  No any other specific complaints  Objective: Vitals:   10/17/23 0838 10/17/23 1202 10/17/23 1555 10/17/23 1624  BP: (!) 127/56 132/63 (!) 131/47 (!) (P) 130/58  Pulse: 87 75 84 (P) 90  Resp:      Temp: 98.5 F  (36.9 C) 98.3 F (36.8 C)    TempSrc:  Oral    SpO2: 95% 97% 95% (P) 94%  Weight:      Height:        Intake/Output Summary (Last 24 hours) at 10/17/2023 1735 Last data filed at 10/17/2023 1512 Gross per 24 hour  Intake 1187.86 ml  Output 3050 ml  Net -1862.14 ml   Filed Weights   10/14/23 1940  Weight: 76.2 kg    Examination:  General exam: Appears calm and comfortable  Respiratory system: Equal air entry bilaterally, bilateral crackles, no wheezes Cardiovascular system: S1 & S2+. No rubs, gallops or clicks.  Gastrointestinal system: Abdomen is obese, soft and nontender. Normal bowel sounds heard. Central nervous system: Alert and awake.  Right-sided weakness power 4/5 no any other focal deficits  Psychiatry: Judgement and insight appears at baseline. Flat mood and affec     Data Reviewed: I have personally reviewed following labs and imaging studies  CBC: Recent Labs  Lab 10/14/23 1946 10/15/23 0425 10/16/23 0423 10/16/23 1611 10/17/23 0412  WBC 11.8* 16.7* 14.0* 11.7* 9.8  NEUTROABS 10.0*  --   --   --   --   HGB 14.9 13.2 13.7 13.4 13.3  HCT 45.5 41.1 41.0  39.8 39.4  MCV 96.6 98.8 95.3 95.4 94.7  PLT 152 119* 134* 131* 140*   Basic Metabolic Panel: Recent Labs  Lab 10/14/23 1946 10/15/23 0425 10/16/23 0423 10/17/23 0412 10/17/23 0413  NA 140 138 137  --  140  K 3.2* 4.0 3.5  --  3.1*  CL 108 112* 111  --  106  CO2 21* 18* 19*  --  22  GLUCOSE 144* 166* 109*  --  88  BUN 24* 24* 24*  --  24*  CREATININE 1.35* 1.05* 0.84  --  0.75  CALCIUM  8.6* 7.7* 8.1*  --  8.5*  MG  --  1.8 2.3 2.1  --   PHOS  --   --  3.0  --  1.9*   GFR: Estimated Creatinine Clearance: 45.5 mL/min (by C-G formula based on SCr of 0.75 mg/dL). Liver Function Tests: Recent Labs  Lab 10/14/23 1946 10/15/23 0425 10/16/23 0423 10/17/23 0412 10/17/23 0413  AST 97* 76* 42* 37  --   ALT 69* 60* 48* 36  --   ALKPHOS 328* 248* 200* 172*  --   BILITOT 1.9* 1.6* 1.2 0.9  --    PROT 6.6 6.1* 6.0* 6.0*  --   ALBUMIN  3.0* 2.6* 2.5* 2.7* 2.8*   No results for input(s): "LIPASE", "AMYLASE" in the last 168 hours. No results for input(s): "AMMONIA" in the last 168 hours. Coagulation Profile: Recent Labs  Lab 10/14/23 1946  INR 1.2   Cardiac Enzymes: No results for input(s): "CKTOTAL", "CKMB", "CKMBINDEX", "TROPONINI" in the last 168 hours. BNP (last 3 results) No results for input(s): "PROBNP" in the last 8760 hours. HbA1C: No results for input(s): "HGBA1C" in the last 72 hours. CBG: No results for input(s): "GLUCAP" in the last 168 hours. Lipid Profile: No results for input(s): "CHOL", "HDL", "LDLCALC", "TRIG", "CHOLHDL", "LDLDIRECT" in the last 72 hours. Thyroid  Function Tests: No results for input(s): "TSH", "T4TOTAL", "FREET4", "T3FREE", "THYROIDAB" in the last 72 hours. Anemia Panel: No results for input(s): "VITAMINB12", "FOLATE", "FERRITIN", "TIBC", "IRON", "RETICCTPCT" in the last 72 hours. Sepsis Labs: Recent Labs  Lab 10/14/23 1950 10/14/23 2126 10/15/23 1507  LATICACIDVEN 3.2* 2.5* 1.3    Recent Results (from the past 240 hours)  Blood Culture (routine x 2)     Status: Abnormal (Preliminary result)   Collection Time: 10/14/23  7:42 PM   Specimen: BLOOD  Result Value Ref Range Status   Specimen Description   Final    BLOOD BLOOD RIGHT ARM Performed at [facility], [address] [place], [place] [address]    Special Requests   Final    BOTTLES DRAWN AEROBIC AND ANAEROBIC Blood Culture adequate volume Performed at [facility], [address], [place], [place] [address]    Culture  Setup Time   Final    GRAM POSITIVE COCCI AEROBIC BOTTLE ONLY CRITICAL RESULT CALLED TO, READ BACK BY AND VERIFIED WITH: JASON ROBBINS 10/16/23 0538 MW    Culture (A)  Final    STAPHYLOCOCCUS HAEMOLYTICUS THE SIGNIFICANCE OF ISOLATING THIS ORGANISM FROM A SINGLE SET OF BLOOD CULTURES WHEN MULTIPLE SETS ARE DRAWN IS UNCERTAIN. PLEASE  NOTIFY THE MICROBIOLOGY DEPARTMENT WITHIN ONE WEEK IF SPECIATION AND SENSITIVITIES ARE REQUIRED. Performed at [facility], [address] [place], [place] [address]    Report Status PENDING  Incomplete  Blood Culture ID Panel (Reflexed)     Status: Abnormal   Collection Time: 10/14/23  7:42 PM  Result Value Ref Range  Status   Enterococcus faecalis NOT DETECTED NOT DETECTED Final   Enterococcus Faecium NOT DETECTED NOT DETECTED Final   Listeria monocytogenes NOT DETECTED NOT DETECTED Final   Staphylococcus species DETECTED (A) NOT DETECTED Final    Comment: CRITICAL RESULT CALLED TO, READ BACK BY AND VERIFIED WITH: JASON ROBBINS 10/16/23 0538 MW    Staphylococcus aureus (BCID) NOT DETECTED NOT DETECTED Final   Staphylococcus epidermidis NOT DETECTED NOT DETECTED Final   Staphylococcus lugdunensis NOT DETECTED NOT DETECTED Final   Streptococcus species NOT DETECTED NOT DETECTED Final   Streptococcus agalactiae NOT DETECTED NOT DETECTED Final   Streptococcus pneumoniae NOT DETECTED NOT DETECTED Final   Streptococcus pyogenes NOT DETECTED NOT DETECTED Final   A.calcoaceticus-baumannii NOT DETECTED NOT DETECTED Final   Bacteroides fragilis NOT DETECTED NOT DETECTED Final   Enterobacterales NOT DETECTED NOT DETECTED Final   Enterobacter cloacae complex NOT DETECTED NOT DETECTED Final   Escherichia coli NOT DETECTED NOT DETECTED Final   Klebsiella aerogenes NOT DETECTED NOT DETECTED Final   Klebsiella oxytoca NOT DETECTED NOT DETECTED Final   Klebsiella pneumoniae NOT DETECTED NOT DETECTED Final   Proteus species NOT DETECTED NOT DETECTED Final   Salmonella species NOT DETECTED NOT DETECTED Final   Serratia marcescens NOT DETECTED NOT DETECTED Final   Haemophilus influenzae NOT DETECTED NOT DETECTED Final   Neisseria meningitidis NOT DETECTED NOT DETECTED Final   Pseudomonas aeruginosa NOT DETECTED NOT DETECTED Final   Stenotrophomonas maltophilia NOT DETECTED NOT DETECTED Final    Candida albicans NOT DETECTED NOT DETECTED Final   Candida auris NOT DETECTED NOT DETECTED Final   Candida glabrata NOT DETECTED NOT DETECTED Final   Candida krusei NOT DETECTED NOT DETECTED Final   Candida parapsilosis NOT DETECTED NOT DETECTED Final   Candida tropicalis NOT DETECTED NOT DETECTED Final   Cryptococcus neoformans/gattii NOT DETECTED NOT DETECTED Final    Comment: Performed at [facility], [address] [place], [place] [address]  Blood Culture (routine x 2)     Status: None (Preliminary result)   Collection Time: 10/14/23  7:50 PM   Specimen: BLOOD  Result Value Ref Range Status   Specimen Description BLOOD BLOOD LEFT FOREARM  Final   Special Requests   Final    BOTTLES DRAWN AEROBIC AND ANAEROBIC Blood Culture adequate volume   Culture   Final    NO GROWTH 3 DAYS Performed at [facility], [address] [place], [place] [address]    Report Status PENDING  Incomplete  Resp panel by RT-PCR (RSV, Flu A&B, Covid) Anterior Nasal Swab     Status: None   Collection Time: 10/14/23  8:01 PM   Specimen: Anterior Nasal Swab  Result Value Ref Range Status   SARS Coronavirus 2 by RT PCR NEGATIVE NEGATIVE Final    Comment: (NOTE) SARS-CoV-2 target nucleic acids are NOT DETECTED.  The SARS-CoV-2 RNA is generally detectable in upper respiratory specimens during the acute phase of infection. The lowest concentration of SARS-CoV-2 viral copies this assay can detect is 138 copies/mL. A negative result does not preclude SARS-Cov-2 infection and should not be used as the sole basis for treatment or other patient management decisions. A negative result may occur with  improper specimen collection/handling, submission of specimen other than nasopharyngeal swab, presence of viral mutation(s) within the areas targeted by this assay, and inadequate number of viral copies(<138 copies/mL). A negative result must be combined with clinical observations, patient  history, and epidemiological information. The expected result is Negative.  Fact Sheet for Patients:  BloggerCourse.com  Fact Sheet for Healthcare Providers:  SeriousBroker.it  This test is no t yet approved or cleared by the [place]  FDA and  has been authorized for detection and/or diagnosis of SARS-CoV-2 by FDA under an Emergency Use Authorization (EUA). This EUA will remain  in effect (meaning this test can be used) for the duration of the COVID-19 declaration under Section 564(b)(1) of the Act, 21 U.S.C.section 360bbb-3(b)(1), unless the authorization is terminated  or revoked sooner.       Influenza A by PCR NEGATIVE NEGATIVE Final   Influenza B by PCR NEGATIVE NEGATIVE Final    Comment: (NOTE) The Xpert Xpress SARS-CoV-2/FLU/RSV plus assay is intended as an aid in the diagnosis of influenza from Nasopharyngeal swab specimens and should not be used as a sole basis for treatment. Nasal washings and aspirates are unacceptable for Xpert Xpress SARS-CoV-2/FLU/RSV testing.  Fact Sheet for Patients: BloggerCourse.com  Fact Sheet for Healthcare Providers: SeriousBroker.it  This test is not yet approved or cleared by the [place]  FDA and has been authorized for detection and/or diagnosis of SARS-CoV-2 by FDA under an Emergency Use Authorization (EUA). This EUA will remain in effect (meaning this test can be used) for the duration of the COVID-19 declaration under Section 564(b)(1) of the Act, 21 U.S.C. section 360bbb-3(b)(1), unless the authorization is terminated or revoked.     Resp Syncytial Virus by PCR NEGATIVE NEGATIVE Final    Comment: (NOTE) Fact Sheet for Patients: BloggerCourse.com  Fact Sheet for Healthcare Providers: SeriousBroker.it  This test is not yet approved or cleared by the [place]  FDA  and has been authorized for detection and/or diagnosis of SARS-CoV-2 by FDA under an Emergency Use Authorization (EUA). This EUA will remain in effect (meaning this test can be used) for the duration of the COVID-19 declaration under Section 564(b)(1) of the Act, 21 U.S.C. section 360bbb-3(b)(1), unless the authorization is terminated or revoked.  Performed at [facility], [address] [place], [place] [address]   Culture, blood (Routine X 2) w Reflex to ID Panel     Status: None (Preliminary result)   Collection Time: 10/16/23 10:16 AM   Specimen: BLOOD  Result Value Ref Range Status   Specimen Description BLOOD BLOOD LEFT ARM  Final   Special Requests   Final    BOTTLES DRAWN AEROBIC AND ANAEROBIC Blood Culture adequate volume   Culture   Final    NO GROWTH < 24 HOURS Performed at [facility], [address] [place], [place] [address]    Report Status PENDING  Incomplete  Culture, blood (Routine X 2) w Reflex to ID Panel     Status: None (Preliminary result)   Collection Time: 10/16/23 10:16 AM   Specimen: BLOOD  Result Value Ref Range Status   Specimen Description BLOOD BLOOD LEFT HAND  Final   Special Requests   Final    BOTTLES DRAWN AEROBIC ONLY Blood Culture results may not be optimal due to an inadequate volume of blood received in culture bottles   Culture   Final    NO GROWTH < 24 HOURS Performed at [facility], [address] [place], [place] [address]    Report Status PENDING  Incomplete         Radiology Studies: MR LIVER W WO CONTRAST Result Date: 10/17/2023 CLINICAL DATA:  Cirrhosis EXAM: MRI ABDOMEN WITHOUT AND WITH CONTRAST TECHNIQUE: Multiplanar multisequence MR imaging of the abdomen was performed both before  and after the administration of intravenous contrast. CONTRAST:  7mL GADAVIST  GADOBUTROL  1 MMOL/ML IV SOLN COMPARISON:  Right upper quadrant ultrasound, 10/14/2023, CT abdomen pelvis, 08/02/2021 FINDINGS: Lower  chest: Cardiomegaly.  Moderate bilateral pleural effusions. Hepatobiliary: Coarse, nodular cirrhotic morphology of the liver. No solid liver abnormality is seen. Multiple small gallstones. Mildly distended gallbladder with trace pericholecystic fluid, nonspecific in the setting of ascites. No significant gallbladder wall thickening. No biliary ductal dilatation nor evidence of choledocholithiasis. Pancreas: Unremarkable. No pancreatic ductal dilatation or surrounding inflammatory changes. Spleen: Mild splenomegaly, maximum coronal span 13.4 cm. Adrenals/Urinary Tract: Adrenal glands are unremarkable. Kidneys are normal, without renal calculi, solid lesion, or hydronephrosis. Stomach/Bowel: Stomach is within normal limits. No evidence of bowel wall thickening, distention, or inflammatory changes. Vascular/Lymphatic: Severe aortic atherosclerosis. 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 No enlarged abdominal lymph nodes. Other: No abdominal wall hernia.  Anasarca.  Trace ascites. Musculoskeletal: No acute or significant osseous findings. IMPRESSION: 1. Cirrhosis. No focal liver lesions. Mild splenomegaly and trace ascites. 2. Cholelithiasis. Mildly distended gallbladder with trace pericholecystic fluid, nonspecific in the setting of ascites. No significant gallbladder wall thickening. No biliary ductal dilatation nor evidence of choledocholithiasis. 3. Severe aortic atherosclerosis. 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 4. Moderate bilateral pleural effusions. Aortic Atherosclerosis (ICD10-I70.0). Electronically Signed   By: Fredricka Jenny M.D.   On: 10/17/2023 06:05   ECHOCARDIOGRAM COMPLETE Result Date: 10/16/2023    ECHOCARDIOGRAM REPORT   Patient Name:   Lauren Morris Date of Exam: 10/15/2023  Medical Rec #:  [id number]   Height:       62.0 in Accession #:    [id number]  Weight:       167.9 lb Date of Birth:  [birth year]  BSA:          1.775 m Patient Age:    89 years    BP:           100/53 mmHg Patient Gender: F           HR:           79 bpm. Exam Location:  [facility] Procedure: 2D Echo, Cardiac Doppler and Color Doppler (Both Spectral and Color            Flow Doppler were utilized during procedure). Indications:     I63.9 Stroke  History:         Patient has prior history of Echocardiogram examinations, most                  recent 08/04/2021. Risk Factors:Hypertension and Sleep Apnea.  Sonographer:     Brigid Canada RDCS Referring Phys:  [id number] MCNE[address] P KIRKPATRICK Diagnosing Phys: Belva Boyden MD IMPRESSIONS  1. Left ventricular ejection fraction, by estimation, is 60 to 65%. The left ventricle has normal function. The left ventricle has no regional wall motion abnormalities. Left ventricular diastolic parameters are consistent with Grade II diastolic dysfunction (pseudonormalization).  2. Right ventricular systolic function is normal. The right ventricular size is normal. There is severely elevated pulmonary artery systolic pressure. The estimated right ventricular systolic pressure is 72.2 mmHg.  3. Moderate pleural effusion in the left lateral region.  4. The mitral valve is normal in structure. Mild mitral valve regurgitation. No evidence of mitral stenosis.  5. The aortic valve  is normal in structure. There is mild calcification of the aortic valve. Aortic valve regurgitation is not visualized. Aortic valve sclerosis is present, with no evidence of aortic valve stenosis.  6. The inferior vena cava is normal in size with greater than 50% respiratory variability, suggesting right atrial pressure of 3 mmHg. FINDINGS  Left Ventricle: Left ventricular ejection fraction, by estimation, is 60 to 65%. The left ventricle has normal function. The left ventricle has no regional wall motion  abnormalities. Strain was performed and the global longitudinal strain is indeterminate. The left ventricular internal cavity size was normal in size. There is no left ventricular hypertrophy. Left ventricular diastolic parameters are consistent with Grade II diastolic dysfunction (pseudonormalization). Right Ventricle: The right ventricular size is normal. No increase in right ventricular wall thickness. Right ventricular systolic function is normal. There is severely elevated pulmonary artery systolic pressure. The tricuspid regurgitant velocity is 4.10 m/s, and with an assumed right atrial pressure of 5 mmHg, the estimated right ventricular systolic pressure is 72.2 mmHg. Left Atrium: Left atrial size was normal in size. Right Atrium: Right atrial size was normal in size. Pericardium: There is no evidence of pericardial effusion. Mitral Valve: The mitral valve is normal in structure. Mild mitral annular calcification. Mild mitral valve regurgitation. No evidence of mitral valve stenosis. Tricuspid Valve: The tricuspid valve is normal in structure. Tricuspid valve regurgitation is mild . No evidence of tricuspid stenosis. The aortic valve is normal in structure. There is mild calcification of the aortic valve. Aortic valve regurgitation is not visualized. Aortic valve sclerosis is present, with no evidence of aortic valve stenosis. Pulmonic Valve: The pulmonic valve was normal in structure. Pulmonic valve regurgitation is not visualized. No evidence of pulmonic stenosis. Aorta: The aortic root is normal in size and structure. Venous: The inferior vena cava is normal in size with greater than 50% respiratory variability, suggesting right atrial pressure of 3 mmHg. IAS/Shunts: No atrial level shunt detected by color flow Doppler. Additional Comments: 3D was performed not requiring image post processing on an independent workstation and was indeterminate. There is a moderate pleural effusion in the left lateral  region.  LEFT VENTRICLE PLAX 2D LVIDd:         3.60 cm   Diastology LVIDs:         2.00 cm   LV e' medial:    5.50 cm/s LV PW:         1.10 cm   LV E/e' medial:  20.5 LV IVS:        0.80 cm   LV e' lateral:   5.55 cm/s LVOT diam:     1.90 cm   LV E/e' lateral: 20.3 LV SV:         69 LV SV Index:   39 LVOT Area:     2.84 cm  RIGHT VENTRICLE            IVC RV Basal diam:  4.00 cm    IVC diam: 1.20 cm RV S prime:     9.79 cm/s TAPSE (M-mode): 1.8 cm LEFT ATRIUM             Index        RIGHT ATRIUM           Index LA diam:        4.00 cm 2.25 cm/m   RA Area:     12.70 cm LA Vol (A2C):   46.4 ml 26.15 ml/m  RA Volume:  30.40 ml  17.13 ml/m LA Vol (A4C):   47.7 ml 26.88 ml/m LA Biplane Vol: 47.3 ml 26.65 ml/m  AORTIC VALVE AV Area (Vmax):    1.84 cm AV Area (Vmean):   1.77 cm AV Area (VTI):     1.92 cm AV Vmax:           190.67 cm/s AV Vmean:          132.667 cm/s AV VTI:            0.361 m AV Peak Grad:      14.5 mmHg AV Mean Grad:      8.3 mmHg LVOT Vmax:         123.50 cm/s LVOT Vmean:        83.050 cm/s LVOT VTI:          0.244 m LVOT/AV VTI ratio: 0.68  AORTA Ao Root diam: 2.70 cm Ao Asc diam:  3.10 cm MITRAL VALVE                TRICUSPID VALVE MV Area (PHT): 3.50 cm     TR Peak grad:   67.2 mmHg MV Decel Time: 217 msec     TR Vmax:        410.00 cm/s MV E velocity: 112.50 cm/s MV A velocity: 100.50 cm/s  SHUNTS MV E/A ratio:  1.12         Systemic VTI:  0.24 m                             Systemic Diam: 1.90 cm Belva Boyden MD Electronically signed by Belva Boyden MD Signature Date/Time: 10/16/2023/7:15:03 AM    Final         Scheduled Meds:   stroke: early stages of recovery book   Does not apply Once   amoxicillin -clavulanate  1 tablet Oral Q12H   aspirin  EC  81 mg Oral Q0600   atorvastatin   40 mg Oral Daily   cycloSPORINE   1 drop Both Eyes BID   enoxaparin  (LOVENOX ) injection  40 mg Subcutaneous Q24H   famotidine   20 mg Oral BID   furosemide   40 mg Intravenous Q12H   midodrine   5  mg Oral TID WC   [START ON 10/18/2023] potassium chloride   40 mEq Oral Daily   prednisoLONE  acetate  2 drop Left Eye BID   sodium bicarbonate   650 mg Oral TID   sodium chloride  flush  3 mL Intravenous Q12H   timolol   1 drop Both Eyes Daily   umeclidinium-vilanterol  1 puff Inhalation Daily   valACYclovir   500 mg Oral QHS   Continuous Infusions:     LOS: 3 days    Total time spent: 55 minutes   Althia Atlas, MD [facility] Pager [phone]  If 7PM-7AM, please contact night-coverage [email or web address] 10/17/2023, 5:35 PM

## 2023-10-17 NOTE — Procedures (Signed)
 PROCEDURE SUMMARY:  Successful image-guided left diagnostic and therapeutic thoracentesis. Yielded 650 mL of amber fluid.  No immediate complications.  EBL: <57mL Patient tolerated well.   Specimen sent for labs. Postprocedure CXR ordered.  Please see imaging section of Epic for full dictation.  Skylee Baird NP 10/17/2023 4:28 PM

## 2023-10-17 NOTE — Consult Note (Addendum)
 PHARMACY CONSULT NOTE - ELECTROLYTES  Pharmacy Consult for Electrolyte Monitoring and Replacement   Recent Labs: Potassium (mmol/L)  Date Value  10/17/2023 3.1 (L)   Magnesium  (mg/dL)  Date Value  96/09/5407 2.1   Calcium  (mg/dL)  Date Value  81/19/1478 8.5 (L)   Albumin  (g/dL)  Date Value  29/56/2130 2.8 (L)   Phosphorus (mg/dL)  Date Value  86/57/8469 1.9 (L)   Sodium (mmol/L)  Date Value  10/17/2023 140   Height: 5\' 2"  (157.5 cm) Weight: 76.2 kg (167 lb 14.4 oz) IBW/kg (Calculated) : 50.1 Estimated Creatinine Clearance: 45.5 mL/min (by C-G formula based on SCr of 0.75 mg/dL).  Assessment  Lauren Morris is a 89 y.o. female presenting with septic shock. PMH significant for recurrent Klebsiella UTI, type 2 diabetes, Graves' disease now with acquired hypothyroidism, hypertension, hyperlipidemia, OSA on CPAP, recurrent bronchitis, meningioma . Pharmacy has been consulted to monitor and replace electrolytes.  Diet: regula diet MIVF:IV lock Pertinent medications: N/A  Goal of Therapy: Electrolytes within normal limits  Plan: Due to recent stroke,and possible surgery, I will replace phos IV today.  K low today after dropping from 3.5 to 3.1 and Phos also below goal at 1.9. Will order Kphos 30 mmol IV x 1 dose today plus Kcl 20 meq po x 1 dose.  Follow-up with AM labs tomorrow.  Lauren Morris PharmD, BCPS 10/17/2023 7:51 AM

## 2023-10-17 NOTE — Care Management Important Message (Signed)
 Important Message  Patient Details  Name: Lauren Morris MRN: [id number] Date of Birth: February 19, 1932   Important Message Given:  Yes - Medicare IM     Anise Kerns 10/17/2023, 11:24 AM

## 2023-10-18 ENCOUNTER — Inpatient Hospital Stay

## 2023-10-18 DIAGNOSIS — R6521 Severe sepsis with septic shock: Secondary | ICD-10-CM | POA: Diagnosis not present

## 2023-10-18 DIAGNOSIS — A419 Sepsis, unspecified organism: Secondary | ICD-10-CM | POA: Diagnosis not present

## 2023-10-18 LAB — PHOSPHORUS: Phosphorus: 3.5 mg/dL (ref 2.5–4.6)

## 2023-10-18 LAB — HEPATIC FUNCTION PANEL
ALT: 33 U/L (ref 0–44)
AST: 39 U/L (ref 15–41)
Albumin: 2.8 g/dL — ABNORMAL LOW (ref 3.5–5.0)
Alkaline Phosphatase: 188 U/L — ABNORMAL HIGH (ref 38–126)
Bilirubin, Direct: 0.4 mg/dL — ABNORMAL HIGH (ref 0.0–0.2)
Indirect Bilirubin: 0.5 mg/dL (ref 0.3–0.9)
Total Bilirubin: 0.9 mg/dL (ref 0.0–1.2)
Total Protein: 6.4 g/dL — ABNORMAL LOW (ref 6.5–8.1)

## 2023-10-18 LAB — CBC
HCT: 42 % (ref 36.0–46.0)
Hemoglobin: 14.1 g/dL (ref 12.0–15.0)
MCH: 31.8 pg (ref 26.0–34.0)
MCHC: 33.6 g/dL (ref 30.0–36.0)
MCV: 94.6 fL (ref 80.0–100.0)
Platelets: 160 10*3/uL (ref 150–400)
RBC: 4.44 MIL/uL (ref 3.87–5.11)
RDW: 14.2 % (ref 11.5–15.5)
WBC: 9.7 10*3/uL (ref 4.0–10.5)
nRBC: 0 % (ref 0.0–0.2)

## 2023-10-18 LAB — CULTURE, BLOOD (ROUTINE X 2)
Culture  Setup Time: NO GROWTH
Special Requests: ADEQUATE

## 2023-10-18 LAB — BASIC METABOLIC PANEL WITH GFR
Anion gap: 9 (ref 5–15)
BUN: 23 mg/dL (ref 8–23)
CO2: 24 mmol/L (ref 22–32)
Calcium: 8.1 mg/dL — ABNORMAL LOW (ref 8.9–10.3)
Chloride: 103 mmol/L (ref 98–111)
Creatinine, Ser: 0.8 mg/dL (ref 0.44–1.00)
GFR, Estimated: >60 mL/min (ref >60)
Glucose, Bld: 109 mg/dL — ABNORMAL HIGH (ref 70–99)
Potassium: 3.7 mmol/L (ref 3.5–5.1)
Sodium: 136 mmol/L (ref 135–145)

## 2023-10-18 LAB — PATHOLOGIST SMEAR REVIEW

## 2023-10-18 LAB — MAGNESIUM: Magnesium: 1.9 mg/dL (ref 1.7–2.4)

## 2023-10-18 MED ORDER — METHYLPREDNISOLONE SODIUM SUCC 40 MG IJ SOLR
40.0000 mg | Freq: Once | INTRAMUSCULAR | Status: AC
  Administered 2023-10-18: 40 mg via INTRAVENOUS
  Filled 2023-10-18: qty 1

## 2023-10-18 MED ORDER — DIPHENHYDRAMINE HCL 50 MG/ML IJ SOLN
12.5000 mg | Freq: Once | INTRAMUSCULAR | Status: AC
  Administered 2023-10-18: 12.5 mg via INTRAVENOUS
  Filled 2023-10-18: qty 1

## 2023-10-18 NOTE — Progress Notes (Signed)
 PT Cancellation Note  Patient Details Name: Lauren Morris MRN: [id number] DOB: 06/14/1932   Cancelled Treatment:     PT attempt. Pt off floor for Ultrasound. Acute PT will continue to follow and progress per current POC.    Koleen Perna 10/18/2023, 10:35 AM

## 2023-10-18 NOTE — Plan of Care (Signed)
  Problem: Clinical Measurements: Goal: Diagnostic test results will improve Outcome: Progressing   Problem: Respiratory: Goal: Ability to maintain adequate ventilation will improve Outcome: Progressing   Problem: Ischemic Stroke/TIA Tissue Perfusion: Goal: Complications of ischemic stroke/TIA will be minimized Outcome: Progressing   Problem: Coping: Goal: Will verbalize positive feelings about self Outcome: Progressing

## 2023-10-18 NOTE — Progress Notes (Signed)
 Occupational Therapy Treatment Patient Details Name: Lauren Morris MRN: [id number] DOB: 1932-06-07 Today's Date: 10/18/2023   History of present illness Pt is an 89 year old presenting to the ED with AMS, admitted with septic shock, cholecystitis, UTI, and with MRI of the brain obtained which demonstrated a small to moderate-sized subcortical stroke on the right. PMH significant for Klebsiella UTI, type 2 diabetes, Graves' disease now with acquired hypothyroidism, hypertension, hyperlipidemia, OSA on CPAP, recurrent bronchitis, meningioma   OT comments  Chart reviewed, pt greeted in bed, agreeable to OT tx session targeting improving functional activity tolerance in prep for ADL tasks. Improvements noted with bed mobility on this date. STS completed with MIN A, short amb transfer with MIN A with RW. Pt spo2 >90% on 2L via [place]. Pt requests to finish breakfast with supervision-SET UP. Pt is making progress towards goals, discharge recommendation remains appropriate.       If plan is discharge home, recommend the following:  A little help with walking and/or transfers;A lot of help with bathing/dressing/bathroom;Assistance with cooking/housework;Help with stairs or ramp for entrance   Equipment Recommendations  Other (comment) (defer)    Recommendations for Other Services      Precautions / Restrictions Precautions Precautions: Fall Recall of Precautions/Restrictions: Intact Restrictions Weight Bearing Restrictions Per Provider Order: No       Mobility Bed Mobility Overal bed mobility: Needs Assistance Bed Mobility: Supine to Sit     Supine to sit: HOB elevated, Used rails, Mod assist          Transfers Overall transfer level: Needs assistance Equipment used: Rolling walker (2 wheels) Transfers: Sit to/from Stand Sit to Stand: Min assist           General transfer comment: intermittent vcs for technique     Balance Overall balance assessment: Needs  assistance Sitting-balance support: Feet supported Sitting balance-Leahy Scale: Fair     Standing balance support: Bilateral upper extremity supported, During functional activity, Reliant on assistive device for balance Standing balance-Leahy Scale: Fair                             ADL either performed or assessed with clinical judgement   ADL Overall ADL's : Needs assistance/impaired Eating/Feeding: Supervision/ safety;Set up;Sitting                   Lower Body Dressing: Minimal assistance Lower Body Dressing Details (indicate cue type and reason): slip on shoes Toilet Transfer: Minimal assistance;BSC/3in1;Rolling walker (2 wheels) Toilet Transfer Details (indicate cue type and reason): short amb transfer to bedside chair                Extremity/Trunk Assessment              Vision       Perception     Praxis     Communication Communication Communication: No apparent difficulties   Cognition Arousal: Alert Behavior During Therapy: WFL for tasks assessed/performed Cognition: Cognition impaired           Executive functioning impairment (select all impairments): Problem solving                   Following commands: Intact Following commands impaired: Follows one step commands with increased time      Cueing   Cueing Techniques: Verbal cues, Tactile cues, Visual cues  Exercises Other Exercises Other Exercises: edu pt/daughter re: role of OT, role of rehab, discharge  recommendations    Shoulder Instructions       General Comments      Pertinent Vitals/ Pain       Pain Assessment Pain Assessment: No/denies pain  Home Living                                          Prior Functioning/Environment              Frequency  Min 2X/week        Progress Toward Goals  OT Goals(current goals can now be found in the care plan section)  Progress towards OT goals: Progressing toward  goals  Acute Rehab OT Goals Time For Goal Achievement: 10/30/23  Plan      Co-evaluation                 AM-PAC OT "6 Clicks" Daily Activity     Outcome Measure   Help from another person eating meals?: None Help from another person taking care of personal grooming?: None Help from another person toileting, which includes using toliet, bedpan, or urinal?: A Lot Help from another person bathing (including washing, rinsing, drying)?: A Lot Help from another person to put on and taking off regular upper body clothing?: A Little Help from another person to put on and taking off regular lower body clothing?: A Lot 6 Click Score: 17    End of Session Equipment Utilized During Treatment: Gait belt;Rolling walker (2 wheels);Oxygen   OT Visit Diagnosis: Other abnormalities of gait and mobility (R26.89);Unsteadiness on feet (R26.81)   Activity Tolerance Patient tolerated treatment well   Patient Left in chair;with call bell/phone within reach;with chair alarm set   Nurse Communication Mobility status        Time: 1610-9604 OT Time Calculation (min): 18 min  Charges: OT General Charges $OT Visit: 1 Visit OT Treatments $Therapeutic Activity: 8-22 mins  Gerre Kraft, OTD OTR/L  10/18/23, 1:34 PM

## 2023-10-18 NOTE — Plan of Care (Signed)
°  Problem: Fluid Volume: °Goal: Hemodynamic stability will improve °Outcome: Progressing °  °Problem: Clinical Measurements: °Goal: Diagnostic test results will improve °Outcome: Progressing °Goal: Signs and symptoms of infection will decrease °Outcome: Progressing °  °

## 2023-10-18 NOTE — Consult Note (Signed)
 PHARMACY CONSULT NOTE - ELECTROLYTES  Pharmacy Consult for Electrolyte Monitoring and Replacement   Recent Labs: Potassium (mmol/L)  Date Value  10/18/2023 3.7   Magnesium  (mg/dL)  Date Value  84/13/2440 1.9   Calcium  (mg/dL)  Date Value  04/21/2535 8.1 (L)   Albumin  (g/dL)  Date Value  64/40/3474 2.8 (L)   Phosphorus (mg/dL)  Date Value  25/95/6387 3.5   Sodium (mmol/L)  Date Value  10/18/2023 136   Height: 5\' 2"  (157.5 cm) Weight: 76.2 kg (167 lb 14.4 oz) IBW/kg (Calculated) : 50.1 Estimated Creatinine Clearance: 45.5 mL/min (by C-G formula based on SCr of 0.8 mg/dL).  Assessment  Lauren Morris is a 89 y.o. female presenting with septic shock. PMH significant for recurrent Klebsiella UTI, type 2 diabetes, Graves' disease now with acquired hypothyroidism, hypertension, hyperlipidemia, OSA on CPAP, recurrent bronchitis, meningioma . Pharmacy has been consulted to monitor and replace electrolytes.  On lasix  IV 40 mg BID + Kcl 40 mEq daily  Diet: regula diet Pertinent medications: N/A  Goal of Therapy: Electrolytes within normal limits  Plan:  No replacement needed.  F/u with AM labs.   Harlie Lieu, PharmD, BCPS 10/18/2023 7:32 AM

## 2023-10-18 NOTE — Progress Notes (Signed)
 PROGRESS NOTE   Lauren Morris  WUJ:[id number] DOB: [birth year] DOA: 10/14/2023 PCP: Thersia Flax, MD   HPI was taken from Dr. Guss Legacy: Lauren Morris is a 89 y.o. female with medical history significant of recurrent Klebsiella UTI, type 2 diabetes, Graves' disease now with acquired hypothyroidism, hypertension, hyperlipidemia, OSA on CPAP, recurrent bronchitis, meningioma, who presents to the ED due to altered mental status.   History obtained from both Mrs. Mano and her daughter at bedside.  Mrs. Braatz states that over the last 1 day, she has noticed increasing difficulty with walking, and this usually happens when she has an underlying UTI.  Her daughter at bedside states that yesterday, Mrs. Searson slept all day, which is unusual for her.  Then she checked on her today at 8 AM, and she seemed close to her baseline.  When she checked on her again at approximately 5 or 6 PM, Mrs. Reliford was laying in her recliner, and still in her pajamas.  She has not gotten up or eaten all day.  She states that this is markedly unusual as Mrs. Cerniglia usually sticks to a schedule.  She seemed confused at the time but was still able to answer questions appropriately.  Mrs. Croy denies any nausea, vomiting, chest pain, shortness of breath, palpitations, lower extremity swelling, cough, rhinorrhea, sinus congestion.  She denies any urinary symptoms at this time other than frequency, which she states is chronic.   ED course: On arrival to the ED, patient was hypotensive at 84/66 with heart rate of 87.  She was saturating at 92% on room air.  She was afebrile at 99.7.  She was tachypneic at 24/min.  Initial workup notable for WBC of 11.8, potassium 3.2, bicarb 21, creatinine 1.35, BUN 24, alkaline phosphatase 328, AST 97, ALT 69, total bilirubin 1.9, GFR 38.  Lactic acid 3.2.  Influenza, RSV and COVID-19 negative.  Chest x-ray and CT head obtained with results pending.  Patient given 2 L of IV fluids, Zosyn  and vancomycin .  TRH  contacted for admission.    Assessment & Plan:   Principal Problem:   Septic shock (HCC) Active Problems:   UTI (urinary tract infection)   AKI (acute kidney injury) (HCC)   Essential hypertension   Acquired hypothyroidism   Type 2 diabetes mellitus (HCC)   Fatty liver disease, nonalcoholic   COPD (chronic obstructive pulmonary disease) (HCC)   OSA (obstructive sleep apnea)   Calculus of gallbladder without cholecystitis without obstruction  Assessment and Plan:  # Septic shock: met criteria w/ AMS, hypotension, tachypnea, leukocytosis, elevated lactic acid & questionable acute cholecystitis vs unlikely UTI.  S/p IV rocephin  and IV flagyl .  Transition to oral antibiotics Augmentin  twice daily to complete 7 days course empirically.  No source of infection identified.  # Acute CVA: MRI showed right basal ganglia infarct, no hemorrhage or mass effect. TTE LVEF 60 to 65%, no WMA, grade 2 diastolic dysfunction, severe PAH, moderate left pleural effusion, no significant valvular abnormality. Continue w/ neuro checks. Neuro consulted, commended aspirin  and statin, no need of DAPT, patient may need surgical intervention.  Cholecystitis ruled out.  As per US  but no abd pain, nausea or vomiting. Likely a poor surgical candidate & pt does not want a percutaneous drain currently.  Continue on IV rocephin  and start IV flagyl .  Blood culture 1/2 growing staph species, d/w ID, rec repeat blood cultures Gen surg following and recs MRI of the liver, which is negative for cholecystitis.  Showed  ascites and bilateral pleural effusions. 4/25 DC'd Augmentin  due to facial rash, redness on the face, no any other symptoms vitals stable.  Solu-Medrol  40 mg IV and Benadryl  12.5 mg IV one-time dose given We will continue to monitor closely   # Acute hypoxic respiratory failure, with pulmonary edema and bilateral pleural effusion, could be due to pneumonia CXR: Bilateral reticular interstitial infiltrates  with a small bilateral pleural effusions left more than right. Left lower lobe atelectasis. Findings could correlate with congestive changes, pneumonia not excluded. Negative COVID, RSV and flu Continue above antibiotics Trend WBC count Lactic acid 2.5--1.3 trended down 4/23 Lasix  80 mg x 1 dose given Continue DuoNeb as needed 4/24 bilateral pleural effusion, IR consulted for thoracentesis, s/p left thoracentesis 650 mL fluid was tapped.  Partially exudative due to high LDH and neutrophils fluid culture no growth 4/24 started Lasix  40 mg IV twice daily  4/25 IR repeated ultrasound on right side but there is not enough fluid to do thoracentesis.   # Hypokalemia, potassium repleted.  # Hypophosphatemia, Phos repleted. Monitor electrolytes and replete as needed.  Unlikely UTI: UA is positive for rare bacteria only.    AKI: likely secondary to sepsis  Cr 1.35---0.84 resolved  OSA: CPAP qhs   Liver cirrhosis with ascites, nonalcoholic. Hx of fatty liver disease. US  shows cirrhosis & likely acute cholecystitis  MRI positive for cirrhosis, ascites and bilateral effusion  Transaminitis: likely secondary to NAFLD & cirrhosis. Will continue to monitor Transaminitis resolved, alk phos is improving  DM2: well controlled, HbA1c 5.9. No need for SSI currently    Hypothyroidism: continue on home dose of levothyroxine    HTN: holding all home anti-HTN meds secondary to hypotension   # Metabolic acidosis, resolved s/p bicarb 3 times daily for 2 days Monitor BMP   DVT prophylaxis: lovenox   Code Status: DNR Family Communication: discussed pt's care w/ pt's daughter at bedside and answered her questions  Disposition Plan: depends on PT/OT recs  Level of care: Progressive  Status is: Inpatient Remains inpatient appropriate because: severity of illness    Consultants:  Neuro Gen surg   Procedures:   Antimicrobials: rocephin , flagyl    Subjective: No significant overnight  events, patient is still having mild shortness of breath, no acute distress and denied any chest pain or palpitations, no any other complaints.   Objective: Vitals:   10/18/23 0012 10/18/23 0419 10/18/23 0814 10/18/23 1220  BP: (!) 114/43 120/61 124/64 (!) 114/58  Pulse: 87 85 83 77  Resp:      Temp: 98.6 F (37 C) 98.6 F (37 C) 98.3 F (36.8 C) 98.2 F (36.8 C)  TempSrc: Oral Oral  Oral  SpO2: 97% 94% 95% 97%  Weight:      Height:        Intake/Output Summary (Last 24 hours) at 10/18/2023 1444 Last data filed at 10/18/2023 1353 Gross per 24 hour  Intake 600.19 ml  Output 1700 ml  Net -1099.81 ml   Filed Weights   10/14/23 1940  Weight: 76.2 kg    Examination:  General exam: Appears calm and comfortable  Respiratory system: Equal air entry bilaterally, bilateral crackles, no wheezes Cardiovascular system: S1 & S2+. No rubs, gallops or clicks.  Gastrointestinal system: Abdomen is obese, soft and nontender. Normal bowel sounds heard. Central nervous system: Alert and awake.  Right-sided weakness power 4/5 no any other focal deficits  Psychiatry: Judgement and insight appears at baseline. Flat mood and affec     Data Reviewed:  I have personally reviewed following labs and imaging studies  CBC: Recent Labs  Lab 10/14/23 1946 10/15/23 0425 10/16/23 0423 10/16/23 1611 10/17/23 0412 10/18/23 0333  WBC 11.8* 16.7* 14.0* 11.7* 9.8 9.7  NEUTROABS 10.0*  --   --   --   --   --   HGB 14.9 13.2 13.7 13.4 13.3 14.1  HCT 45.5 41.1 41.0 39.8 39.4 42.0  MCV 96.6 98.8 95.3 95.4 94.7 94.6  PLT 152 119* 134* 131* 140* 160   Basic Metabolic Panel: Recent Labs  Lab 10/14/23 1946 10/15/23 0425 10/16/23 0423 10/17/23 0412 10/17/23 0413 10/18/23 0333  NA 140 138 137  --  140 136  K 3.2* 4.0 3.5  --  3.1* 3.7  CL 108 112* 111  --  106 103  CO2 21* 18* 19*  --  22 24  GLUCOSE 144* 166* 109*  --  88 109*  BUN 24* 24* 24*  --  24* 23  CREATININE 1.35* 1.05* 0.84  --   0.75 0.80  CALCIUM  8.6* 7.7* 8.1*  --  8.5* 8.1*  MG  --  1.8 2.3 2.1  --  1.9  PHOS  --   --  3.0  --  1.9* 3.5   GFR: Estimated Creatinine Clearance: 45.5 mL/min (by C-G formula based on SCr of 0.8 mg/dL). Liver Function Tests: Recent Labs  Lab 10/14/23 1946 10/15/23 0425 10/16/23 0423 10/17/23 0412 10/17/23 0413 10/18/23 0333  AST 97* 76* 42* 37  --  39  ALT 69* 60* 48* 36  --  33  ALKPHOS 328* 248* 200* 172*  --  188*  BILITOT 1.9* 1.6* 1.2 0.9  --  0.9  PROT 6.6 6.1* 6.0* 6.0*  --  6.4*  ALBUMIN  3.0* 2.6* 2.5* 2.7* 2.8* 2.8*   No results for input(s): "LIPASE", "AMYLASE" in the last 168 hours. No results for input(s): "AMMONIA" in the last 168 hours. Coagulation Profile: Recent Labs  Lab 10/14/23 1946  INR 1.2   Cardiac Enzymes: No results for input(s): "CKTOTAL", "CKMB", "CKMBINDEX", "TROPONINI" in the last 168 hours. BNP (last 3 results) No results for input(s): "PROBNP" in the last 8760 hours. HbA1C: No results for input(s): "HGBA1C" in the last 72 hours. CBG: No results for input(s): "GLUCAP" in the last 168 hours. Lipid Profile: No results for input(s): "CHOL", "HDL", "LDLCALC", "TRIG", "CHOLHDL", "LDLDIRECT" in the last 72 hours. Thyroid  Function Tests: No results for input(s): "TSH", "T4TOTAL", "FREET4", "T3FREE", "THYROIDAB" in the last 72 hours. Anemia Panel: No results for input(s): "VITAMINB12", "FOLATE", "FERRITIN", "TIBC", "IRON", "RETICCTPCT" in the last 72 hours. Sepsis Labs: Recent Labs  Lab 10/14/23 1950 10/14/23 2126 10/15/23 1507  LATICACIDVEN 3.2* 2.5* 1.3    Recent Results (from the past 240 hours)  Blood Culture (routine x 2)     Status: Abnormal   Collection Time: 10/14/23  7:42 PM   Specimen: BLOOD  Result Value Ref Range Status   Specimen Description   Final    BLOOD BLOOD RIGHT ARM Performed at [facility], [address] [place], [place] [address]    Special Requests   Final    BOTTLES DRAWN AEROBIC AND  ANAEROBIC Blood Culture adequate volume Performed at [facility], [address] [place], [place] [address]    Culture  Setup Time   Final    GRAM POSITIVE COCCI AEROBIC BOTTLE ONLY CRITICAL RESULT CALLED TO, READ BACK BY AND VERIFIED WITH: JASON ROBBINS 10/16/23 0538 MW    Culture (A)  Final    STAPHYLOCOCCUS HAEMOLYTICUS THE SIGNIFICANCE OF ISOLATING THIS ORGANISM FROM A SINGLE SET OF BLOOD CULTURES WHEN MULTIPLE SETS ARE DRAWN IS UNCERTAIN. PLEASE NOTIFY THE MICROBIOLOGY DEPARTMENT WITHIN ONE WEEK IF SPECIATION AND SENSITIVITIES ARE REQUIRED. Performed at [facility], [address], [place], [place] [address]    Report Status 10/18/2023 FINAL  Final  Blood Culture ID Panel (Reflexed)     Status: Abnormal   Collection Time: 10/14/23  7:42 PM  Result Value Ref Range Status   Enterococcus faecalis NOT DETECTED NOT DETECTED Final   Enterococcus Faecium NOT DETECTED NOT DETECTED Final   Listeria monocytogenes NOT DETECTED NOT DETECTED Final   Staphylococcus species DETECTED (A) NOT DETECTED Final    Comment: CRITICAL RESULT CALLED TO, READ BACK BY AND VERIFIED WITH: JASON ROBBINS 10/16/23 0538 MW    Staphylococcus aureus (BCID) NOT DETECTED NOT DETECTED Final   Staphylococcus epidermidis NOT DETECTED NOT DETECTED Final   Staphylococcus lugdunensis NOT DETECTED NOT DETECTED Final   Streptococcus species NOT DETECTED NOT DETECTED Final   Streptococcus agalactiae NOT DETECTED NOT DETECTED Final   Streptococcus pneumoniae NOT DETECTED NOT DETECTED Final   Streptococcus pyogenes NOT DETECTED NOT DETECTED Final   A.calcoaceticus-baumannii NOT DETECTED NOT DETECTED Final   Bacteroides fragilis NOT DETECTED NOT DETECTED Final   Enterobacterales NOT DETECTED NOT DETECTED Final   Enterobacter cloacae complex NOT DETECTED NOT DETECTED Final   Escherichia coli NOT DETECTED NOT DETECTED Final   Klebsiella aerogenes NOT DETECTED NOT DETECTED Final   Klebsiella oxytoca NOT  DETECTED NOT DETECTED Final   Klebsiella pneumoniae NOT DETECTED NOT DETECTED Final   Proteus species NOT DETECTED NOT DETECTED Final   Salmonella species NOT DETECTED NOT DETECTED Final   Serratia marcescens NOT DETECTED NOT DETECTED Final   Haemophilus influenzae NOT DETECTED NOT DETECTED Final   Neisseria meningitidis NOT DETECTED NOT DETECTED Final   Pseudomonas aeruginosa NOT DETECTED NOT DETECTED Final   Stenotrophomonas maltophilia NOT DETECTED NOT DETECTED Final   Candida albicans NOT DETECTED NOT DETECTED Final   Candida auris NOT DETECTED NOT DETECTED Final   Candida glabrata NOT DETECTED NOT DETECTED Final   Candida krusei NOT DETECTED NOT DETECTED Final   Candida parapsilosis NOT DETECTED NOT DETECTED Final   Candida tropicalis NOT DETECTED NOT DETECTED Final   Cryptococcus neoformans/gattii NOT DETECTED NOT DETECTED Final    Comment: Performed at [facility], [address] [place], [place] [address]  Blood Culture (routine x 2)     Status: None (Preliminary result)   Collection Time: 10/14/23  7:50 PM   Specimen: BLOOD  Result Value Ref Range Status   Specimen Description BLOOD BLOOD LEFT FOREARM  Final   Special Requests   Final    BOTTLES DRAWN AEROBIC AND ANAEROBIC Blood Culture adequate volume   Culture   Final    NO GROWTH 4 DAYS Performed at [facility], [address] [place], [place] [address]    Report Status PENDING  Incomplete  Resp panel by RT-PCR (RSV, Flu A&B, Covid) Anterior Nasal Swab     Status: None   Collection Time: 10/14/23  8:01 PM   Specimen: Anterior Nasal Swab  Result Value Ref Range Status   SARS Coronavirus 2 by RT PCR NEGATIVE NEGATIVE Final    Comment: (NOTE) SARS-CoV-2 target nucleic acids are NOT DETECTED.  The SARS-CoV-2 RNA is generally detectable in upper respiratory specimens during the acute phase of infection. The lowest concentration of SARS-CoV-2 viral  copies this assay can detect is 138 copies/mL. A  negative result does not preclude SARS-Cov-2 infection and should not be used as the sole basis for treatment or other patient management decisions. A negative result may occur with  improper specimen collection/handling, submission of specimen other than nasopharyngeal swab, presence of viral mutation(s) within the areas targeted by this assay, and inadequate number of viral copies(<138 copies/mL). A negative result must be combined with clinical observations, patient history, and epidemiological information. The expected result is Negative.  Fact Sheet for Patients:  BloggerCourse.com  Fact Sheet for Healthcare Providers:  SeriousBroker.it  This test is no t yet approved or cleared by the [place]  FDA and  has been authorized for detection and/or diagnosis of SARS-CoV-2 by FDA under an Emergency Use Authorization (EUA). This EUA will remain  in effect (meaning this test can be used) for the duration of the COVID-19 declaration under Section 564(b)(1) of the Act, 21 U.S.C.section 360bbb-3(b)(1), unless the authorization is terminated  or revoked sooner.       Influenza A by PCR NEGATIVE NEGATIVE Final   Influenza B by PCR NEGATIVE NEGATIVE Final    Comment: (NOTE) The Xpert Xpress SARS-CoV-2/FLU/RSV plus assay is intended as an aid in the diagnosis of influenza from Nasopharyngeal swab specimens and should not be used as a sole basis for treatment. Nasal washings and aspirates are unacceptable for Xpert Xpress SARS-CoV-2/FLU/RSV testing.  Fact Sheet for Patients: BloggerCourse.com  Fact Sheet for Healthcare Providers: SeriousBroker.it  This test is not yet approved or cleared by the [place]  FDA and has been authorized for detection and/or diagnosis of SARS-CoV-2 by FDA under an Emergency Use Authorization (EUA). This EUA will remain in effect (meaning this test can  be used) for the duration of the COVID-19 declaration under Section 564(b)(1) of the Act, 21 U.S.C. section 360bbb-3(b)(1), unless the authorization is terminated or revoked.     Resp Syncytial Virus by PCR NEGATIVE NEGATIVE Final    Comment: (NOTE) Fact Sheet for Patients: BloggerCourse.com  Fact Sheet for Healthcare Providers: SeriousBroker.it  This test is not yet approved or cleared by the [place]  FDA and has been authorized for detection and/or diagnosis of SARS-CoV-2 by FDA under an Emergency Use Authorization (EUA). This EUA will remain in effect (meaning this test can be used) for the duration of the COVID-19 declaration under Section 564(b)(1) of the Act, 21 U.S.C. section 360bbb-3(b)(1), unless the authorization is terminated or revoked.  Performed at [facility], [address] [place], [place] [address]   Culture, blood (Routine X 2) w Reflex to ID Panel     Status: None (Preliminary result)   Collection Time: 10/16/23 10:16 AM   Specimen: BLOOD  Result Value Ref Range Status   Specimen Description BLOOD BLOOD LEFT ARM  Final   Special Requests   Final    BOTTLES DRAWN AEROBIC AND ANAEROBIC Blood Culture adequate volume   Culture   Final    NO GROWTH 2 DAYS Performed at [facility], [address], [place], [place] [address]    Report Status PENDING  Incomplete  Culture, blood (Routine X 2) w Reflex to ID Panel     Status: None (Preliminary result)   Collection Time: 10/16/23 10:16 AM   Specimen: BLOOD  Result Value Ref Range Status   Specimen Description BLOOD BLOOD LEFT HAND  Final   Special Requests   Final    BOTTLES DRAWN AEROBIC ONLY Blood Culture results may not be  optimal due to an inadequate volume of blood received in culture bottles   Culture   Final    NO GROWTH 2 DAYS Performed at [facility], [address] [place], [place] [address]    Report Status  PENDING  Incomplete  Body fluid culture w Gram Stain     Status: None (Preliminary result)   Collection Time: 10/17/23  4:20 PM   Specimen: PATH Cytology Pleural fluid  Result Value Ref Range Status   Specimen Description   Final    FLUID Performed at [facility], [address] [place], [place] [address]    Special Requests   Final    CYTO PLEU Performed at [facility], [address] [place], [place] [address]    Gram Stain   Final    FEW WBC PRESENT, PREDOMINANTLY MONONUCLEAR NO ORGANISMS SEEN    Culture   Final    NO GROWTH < 24 HOURS Performed at [facility], [address], [place], [place] [address]    Report Status PENDING  Incomplete         Radiology Studies: US  CHEST (PLEURAL EFFUSION) Result Date: 10/18/2023 CLINICAL DATA:  89 year old female with prior left-sided thoracentesis EXAM: CHEST ULTRASOUND COMPARISON:  None Available. FINDINGS: Ultrasound demonstrates scant right-sided pleural fluid. Thoracentesis deferred at this time. IMPRESSION: Scant right pleural fluid Electronically Signed   By: Myrlene Asper D.O.   On: 10/18/2023 11:09   DG Chest Port 1 View Result Date: 10/17/2023 CLINICAL DATA:  Status post thoracentesis EXAM: PORTABLE CHEST 1 VIEW COMPARISON:  Chest x-ray 10/15/2023 FINDINGS: Heart is enlarged. There is central pulmonary vascular congestion. There is a small left pleural effusion which has slightly decreased from prior. No pneumothorax visualized. There is atelectasis in the left lung base and right mid lung. No acute fractures are seen. IMPRESSION: 1. Small left pleural effusion which has slightly decreased from prior. No pneumothorax visualized. 2. Cardiomegaly with central pulmonary vascular congestion. Electronically Signed   By: Tyron Gallon M.D.   On: 10/17/2023 19:22   US  THORACENTESIS ASP PLEURAL SPACE W/IMG GUIDE Result Date: 10/17/2023 INDICATION: Patient with history of cirrhosis, hospitalized for stroke.  During hospitalization, team noted bilateral pleural effusions; request for thoracentesis. Has not received thoracentesis before. EXAM: ULTRASOUND GUIDED DIAGNOSTIC AND THERAPEUTIC THORACENTESIS MEDICATIONS: 10 mL 1% lidocaine  COMPLICATIONS: None immediate. PROCEDURE: An ultrasound guided thoracentesis was thoroughly discussed with the patient and questions answered. The benefits, risks, alternatives and complications were also discussed. The patient understands and wishes to proceed with the procedure. Written consent was obtained. Ultrasound was performed to localize and mark an adequate pocket of fluid in the left chest. The area was then prepped and draped in the normal sterile fashion. 1% Lidocaine  was used for local anesthesia. Under ultrasound guidance a 6 Fr Safe-T-Centesis catheter was introduced. Thoracentesis was performed. The catheter was removed and a dressing applied. FINDINGS: A total of approximately 650 milliliters of amber fluid was removed. Samples were sent to the laboratory as requested by the clinical team. IMPRESSION: Successful ultrasound guided left diagnostic and therapeutic thoracentesis yielding 650 milliliters of pleural fluid. A post-procedural chest x-ray was ordered. Performed by Terressa Fess, NP Electronically Signed   By: Elene Griffes M.D.   On: 10/17/2023 17:59   MR LIVER W WO CONTRAST Result Date: 10/17/2023 CLINICAL DATA:  Cirrhosis EXAM: MRI ABDOMEN WITHOUT AND WITH CONTRAST TECHNIQUE: Multiplanar multisequence MR imaging of the abdomen was performed both before and after the administration  of intravenous contrast. CONTRAST:  7mL GADAVIST  GADOBUTROL  1 MMOL/ML IV SOLN COMPARISON:  Right upper quadrant ultrasound, 10/14/2023, CT abdomen pelvis, 08/02/2021 FINDINGS: Lower chest: Cardiomegaly.  Moderate bilateral pleural effusions. Hepatobiliary: Coarse, nodular cirrhotic morphology of the liver. No solid liver abnormality is seen. Multiple small gallstones. Mildly distended  gallbladder with trace pericholecystic fluid, nonspecific in the setting of ascites. No significant gallbladder wall thickening. No biliary ductal dilatation nor evidence of choledocholithiasis. Pancreas: Unremarkable. No pancreatic ductal dilatation or surrounding inflammatory changes. Spleen: Mild splenomegaly, maximum coronal span 13.4 cm. Adrenals/Urinary Tract: Adrenal glands are unremarkable. Kidneys are normal, without renal calculi, solid lesion, or hydronephrosis. Stomach/Bowel: Stomach is within normal limits. No evidence of bowel wall thickening, distention, or inflammatory changes. Vascular/Lymphatic: Severe aortic atherosclerosis. 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 No enlarged abdominal lymph nodes. Other: No abdominal wall hernia.  Anasarca.  Trace ascites. Musculoskeletal: No acute or significant osseous findings. IMPRESSION: 1. Cirrhosis. No focal liver lesions. Mild splenomegaly and trace ascites. 2. Cholelithiasis. Mildly distended gallbladder with trace pericholecystic fluid, nonspecific in the setting of ascites. No significant gallbladder wall thickening. No biliary ductal dilatation nor evidence of choledocholithiasis. 3. Severe aortic atherosclerosis. 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 4. Moderate bilateral pleural effusions. Aortic Atherosclerosis (ICD10-I70.0). Electronically Signed   By: Fredricka Jenny M.D.   On: 10/17/2023 06:05        Scheduled Meds:   stroke: early stages of recovery book   Does not apply Once   amoxicillin -clavulanate  1 tablet Oral Q12H   aspirin  EC  81 mg Oral Q0600   atorvastatin   40 mg Oral Daily   cycloSPORINE   1 drop Both Eyes BID   enoxaparin  (LOVENOX ) injection  40 mg Subcutaneous Q24H   famotidine   20 mg Oral BID    feeding supplement  237 mL Oral TID BM   furosemide   40 mg Intravenous Q12H   midodrine   5 mg Oral TID WC   potassium chloride   40 mEq Oral Daily   prednisoLONE  acetate  2 drop Left Eye BID   sodium chloride  flush  3 mL Intravenous Q12H   timolol   1 drop Both Eyes Daily   umeclidinium-vilanterol  1 puff Inhalation Daily   valACYclovir   500 mg Oral QHS   Continuous Infusions:     LOS: 4 days    Total time spent: 55 minutes   Althia Atlas, MD [facility] Pager [phone]  If 7PM-7AM, please contact night-coverage [email or web address] 10/18/2023, 2:44 PM

## 2023-10-18 NOTE — TOC Progression Note (Signed)
 Transition of Care [facility]) - Progression Note    Patient Details  Name: HANNELORE BOVA MRN: [id number] Date of Birth: 19-Feb-1932  Transition of Care [facility] CM/SW Contact  Odilia Bennett, LCSW Phone Number: 10/18/2023, 12:22 PM  Clinical Narrative:  [facility] cannot confirm if they can offer a bed or not until Monday. Patient and daughter are aware. Discussed other potential options and sent referral out to those facilities. Patient stated she has not used her home CPAP in 3-4 months. She also does not use oxygen  at home but uses an inhaler.  Expected Discharge Plan: Skilled Nursing Facility Barriers to Discharge: Continued Medical Work up  Expected Discharge Plan and Services     Post Acute Care Choice: Skilled Nursing Facility Living arrangements for the past 2 months: Single Family Home                                       Social Determinants of Health (SDOH) Interventions SDOH Screenings   Food Insecurity: No Food Insecurity (10/15/2023)  Housing: Low Risk  (10/15/2023)  Transportation Needs: No Transportation Needs (10/15/2023)  Utilities: Not At Risk (10/15/2023)  Depression (PHQ2-9): Low Risk  (09/10/2023)  Social Connections: Moderately Integrated (10/15/2023)  Tobacco Use: Low Risk  (10/15/2023)    Readmission Risk Interventions     No data to display

## 2023-10-18 NOTE — Progress Notes (Signed)
 Patient admitted with AMS, septic shock, acute CVA - found to have bilateral pleural effusions s/p left thoracentesis yesterday in IR which yielded 650 mL. Request for possible right thoracentesis today.  US  examination of the right chest shows very small pleural effusion which is not amenable to safe thoracentesis. Images reviewed with patient who states understanding and is pleased that she does not need another procedure.   No procedure performed. Images available for review under imaging section of Epic.  Nathan Bake, PA-C

## 2023-10-19 ENCOUNTER — Inpatient Hospital Stay

## 2023-10-19 DIAGNOSIS — R6521 Severe sepsis with septic shock: Secondary | ICD-10-CM | POA: Diagnosis not present

## 2023-10-19 DIAGNOSIS — A419 Sepsis, unspecified organism: Secondary | ICD-10-CM | POA: Diagnosis not present

## 2023-10-19 LAB — CBC
HCT: 40.8 % (ref 36.0–46.0)
Hemoglobin: 14 g/dL (ref 12.0–15.0)
MCH: 31.3 pg (ref 26.0–34.0)
MCHC: 34.3 g/dL (ref 30.0–36.0)
MCV: 91.3 fL (ref 80.0–100.0)
Platelets: 168 10*3/uL (ref 150–400)
RBC: 4.47 MIL/uL (ref 3.87–5.11)
RDW: 14 % (ref 11.5–15.5)
WBC: 8.3 10*3/uL (ref 4.0–10.5)
nRBC: 0 % (ref 0.0–0.2)

## 2023-10-19 LAB — BASIC METABOLIC PANEL WITH GFR
Anion gap: 9 (ref 5–15)
BUN: 24 mg/dL — ABNORMAL HIGH (ref 8–23)
CO2: 28 mmol/L (ref 22–32)
Calcium: 8.5 mg/dL — ABNORMAL LOW (ref 8.9–10.3)
Chloride: 102 mmol/L (ref 98–111)
Creatinine, Ser: 0.74 mg/dL (ref 0.44–1.00)
GFR, Estimated: >60 mL/min (ref >60)
Glucose, Bld: 118 mg/dL — ABNORMAL HIGH (ref 70–99)
Potassium: 4.1 mmol/L (ref 3.5–5.1)
Sodium: 139 mmol/L (ref 135–145)

## 2023-10-19 LAB — URINALYSIS, COMPLETE (UACMP) WITH MICROSCOPIC
Bacteria, UA: NONE SEEN
Bilirubin Urine: NEGATIVE
Glucose, UA: NEGATIVE mg/dL
Hgb urine dipstick: NEGATIVE
Ketones, ur: NEGATIVE mg/dL
Leukocytes,Ua: NEGATIVE
Nitrite: NEGATIVE
Protein, ur: NEGATIVE mg/dL
Specific Gravity, Urine: 1.012 (ref 1.005–1.030)
pH: 7 (ref 5.0–8.0)

## 2023-10-19 LAB — CULTURE, BLOOD (ROUTINE X 2)
Culture: NO GROWTH
Special Requests: ADEQUATE

## 2023-10-19 LAB — HEPATIC FUNCTION PANEL
ALT: 30 U/L (ref 0–44)
AST: 43 U/L — ABNORMAL HIGH (ref 15–41)
Albumin: 2.8 g/dL — ABNORMAL LOW (ref 3.5–5.0)
Alkaline Phosphatase: 201 U/L — ABNORMAL HIGH (ref 38–126)
Bilirubin, Direct: 0.4 mg/dL — ABNORMAL HIGH (ref 0.0–0.2)
Indirect Bilirubin: 0.6 mg/dL (ref 0.3–0.9)
Total Bilirubin: 1 mg/dL (ref 0.0–1.2)
Total Protein: 6.2 g/dL — ABNORMAL LOW (ref 6.5–8.1)

## 2023-10-19 LAB — PHOSPHORUS: Phosphorus: 3.4 mg/dL (ref 2.5–4.6)

## 2023-10-19 LAB — MAGNESIUM: Magnesium: 1.9 mg/dL (ref 1.7–2.4)

## 2023-10-19 MED ORDER — FUROSEMIDE 40 MG PO TABS
40.0000 mg | ORAL_TABLET | Freq: Every day | ORAL | Status: DC
Start: 2023-10-20 — End: 2023-10-22
  Administered 2023-10-20 – 2023-10-22 (×3): 40 mg via ORAL
  Filled 2023-10-19 (×4): qty 1

## 2023-10-19 MED ORDER — FUROSEMIDE 10 MG/ML IJ SOLN
40.0000 mg | Freq: Two times a day (BID) | INTRAMUSCULAR | Status: AC
  Administered 2023-10-19: 40 mg via INTRAVENOUS
  Filled 2023-10-19: qty 4

## 2023-10-19 MED ORDER — ALUM & MAG HYDROXIDE-SIMETH 200-200-20 MG/5ML PO SUSP
15.0000 mL | Freq: Four times a day (QID) | ORAL | Status: DC | PRN
  Administered 2023-10-19: 15 mL via ORAL
  Filled 2023-10-19: qty 30

## 2023-10-19 MED ORDER — PANTOPRAZOLE SODIUM 40 MG PO TBEC
40.0000 mg | DELAYED_RELEASE_TABLET | Freq: Every day | ORAL | Status: DC
  Administered 2023-10-19 – 2023-10-22 (×4): 40 mg via ORAL
  Filled 2023-10-19 (×5): qty 1

## 2023-10-19 NOTE — Plan of Care (Signed)
   Problem: Fluid Volume: Goal: Hemodynamic stability will improve Outcome: Progressing   Problem: Clinical Measurements: Goal: Diagnostic test results will improve Outcome: Progressing Goal: Signs and symptoms of infection will decrease Outcome: Progressing   Problem: Respiratory: Goal: Ability to maintain adequate ventilation will improve Outcome: Progressing

## 2023-10-19 NOTE — Progress Notes (Signed)
 PROGRESS NOTE   Lauren Morris  ZOX:[id number] DOB: 10/14/1931 DOA: 10/14/2023 PCP: Thersia Flax, MD   HPI was taken from Dr. Guss Legacy: Lauren Morris is a 89 y.o. female with medical history significant of recurrent Klebsiella UTI, type 2 diabetes, Graves' disease now with acquired hypothyroidism, hypertension, hyperlipidemia, OSA on CPAP, recurrent bronchitis, meningioma, who presents to the ED due to altered mental status.   History obtained from both Lauren Morris and her daughter at bedside.  Lauren Morris states that over the last 1 day, she has noticed increasing difficulty with walking, and this usually happens when she has an underlying UTI.  Her daughter at bedside states that yesterday, Lauren Morris slept all day, which is unusual for her.  Then she checked on her today at 8 AM, and she seemed close to her baseline.  When she checked on her again at approximately 5 or 6 PM, Lauren Morris was laying in her recliner, and still in her pajamas.  She has not gotten up or eaten all day.  She states that this is markedly unusual as Lauren Morris usually sticks to a schedule.  She seemed confused at the time but was still able to answer questions appropriately.  Lauren Morris denies any nausea, vomiting, chest pain, shortness of breath, palpitations, lower extremity swelling, cough, rhinorrhea, sinus congestion.  She denies any urinary symptoms at this time other than frequency, which she states is chronic.   ED course: On arrival to the ED, patient was hypotensive at 84/66 with heart rate of 87.  She was saturating at 92% on room air.  She was afebrile at 99.7.  She was tachypneic at 24/min.  Initial workup notable for WBC of 11.8, potassium 3.2, bicarb 21, creatinine 1.35, BUN 24, alkaline phosphatase 328, AST 97, ALT 69, total bilirubin 1.9, GFR 38.  Lactic acid 3.2.  Influenza, RSV and COVID-19 negative.  Chest x-ray and CT head obtained with results pending.  Patient given 2 L of IV fluids, Zosyn  and vancomycin .  TRH  contacted for admission.    Assessment & Plan:   Principal Problem:   Septic shock (HCC) Active Problems:   UTI (urinary tract infection)   AKI (acute kidney injury) (HCC)   Essential hypertension   Acquired hypothyroidism   Type 2 diabetes mellitus (HCC)   Fatty liver disease, nonalcoholic   COPD (chronic obstructive pulmonary disease) (HCC)   OSA (obstructive sleep apnea)   Calculus of gallbladder without cholecystitis without obstruction  Assessment and Plan:  # Septic shock: met criteria w/ AMS, hypotension, tachypnea, leukocytosis, elevated lactic acid & questionable acute cholecystitis vs unlikely UTI.  S/p IV rocephin  and IV flagyl .  Transition to oral antibiotics Augmentin  BID, patient received 2 doses and developed redness on the face, suspected due to Augmentin  so discontinued. No source of infection identified.  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 TTE LVEF 60 to 65%, no WMA, grade 2 diastolic dysfunction, severe PAH, moderate left pleural effusion, no significant valvular abnormality. Continue w/ neuro checks. Neuro consulted, commended aspirin  and statin, no need of DAPT, patient may need surgical intervention. 4/26 worsening of left-sided weakness, follow repeat MRI brain, discussed with neurologist  # Cholecystitis ruled out.  As per US  but no abd pain, nausea or vomiting. Likely a poor surgical candidate & pt does not want a percutaneous drain currently.  Continue on IV rocephin  and start IV flagyl .  Blood  culture 1/2 growing staph species, d/w ID, rec repeat blood cultures Gen surg following and recs MRI of the liver, which is negative for cholecystitis.  Showed ascites and bilateral pleural effusions. 4/25 DC'd Augmentin  due to facial rash, redness on the face, no any other symptoms vitals stable.  Solu-Medrol  40 mg IV and Benadryl  12.5 mg IV one-time dose given We will continue to  monitor closely   # Acute hypoxic respiratory failure, with pulmonary edema and bilateral pleural effusion, could be due to pneumonia CXR: Bilateral reticular interstitial infiltrates with a small bilateral pleural effusions left more than right. Left lower lobe atelectasis. Findings could correlate with congestive changes, pneumonia not excluded. Negative COVID, RSV and flu Continue above antibiotics Trend WBC count Lactic acid 2.5--1.3 trended down 4/23 Lasix  80 mg x 1 dose given Continue DuoNeb as needed 4/24 bilateral pleural effusion, IR consulted for thoracentesis, s/p left thoracentesis 650 mL fluid was tapped.  Partially exudative due to high LDH and neutrophils fluid culture no growth 4/24 --4/26 Lasix  40 mg IV twice daily  4/25 IR repeated ultrasound on right side but there is not enough fluid to do thoracentesis. Transition to Lasix  40 mg p.o. daily from tomorrow 4/27   # Hypokalemia, potassium repleted.  # Hypophosphatemia, Phos repleted. Monitor electrolytes and replete as needed.  Unlikely UTI: UA is positive for rare bacteria only.  4/26 c/o dysuria, repeat UA and urine culture   AKI: likely secondary to sepsis  Cr 1.35---0.84 resolved  OSA: CPAP qhs   Liver cirrhosis with ascites, nonalcoholic. Hx of fatty liver disease. US  shows cirrhosis & likely acute cholecystitis  MRI positive for cirrhosis, ascites and bilateral effusion Started Lasix  40 mg p.o. daily  Transaminitis: likely secondary to NAFLD & cirrhosis. Will continue to monitor Transaminitis resolved, alk phos is improving  DM2: well controlled, HbA1c 5.9. No need for SSI currently    Hypothyroidism: continue home dose Levothyroxine    HTN: holding all home anti-HTN meds secondary to hypotension  Patient was on telmisartan  40 mg p.o. daily and atenolol  25 mg at twice daily at home Monitor BP and titrate medications accordingly  # Metabolic acidosis, resolved s/p bicarb 3 times daily for 2  days Monitor BMP   DVT prophylaxis: lovenox   Code Status: DNR Family Communication: discussed pt's care w/ pt's daughter at bedside and answered her questions  Disposition Plan: depends on PT/OT recs  Level of care: Progressive  Status is: Inpatient Remains inpatient appropriate because: severity of illness    Consultants:  Neuro Gen surg   Procedures:   Antimicrobials: rocephin , flagyl    Subjective: No significant overnight events, yesterday evening patient had redness of the face which has been resolved.  As per daughter patient having worsening of neurological symptoms, left facial droop and left-sided weakness. Discussed with neurology, MRI brain ordered.    Objective: Vitals:   10/19/23 0437 10/19/23 0828 10/19/23 1115 10/19/23 1559  BP: (!) 125/50 134/62 (!) 136/59 (!) 144/53  Pulse: 77 82 85 86  Resp: 20 17 16 16   Temp: 98.3 F (36.8 C) 98.8 F (37.1 C) 98.7 F (37.1 C) 98 F (36.7 C)  TempSrc: Oral Oral Oral Oral  SpO2: 96% 91% 96% 94%  Weight:      Height:        Intake/Output Summary (Last 24 hours) at 10/19/2023 1641 Last data filed at 10/19/2023 1500 Gross per 24 hour  Intake 250 ml  Output 1250 ml  Net -1000 ml   [employer]  10/14/23 1940  Weight: 76.2 kg    Examination:  General exam: Appears calm and comfortable  Respiratory system: Equal air entry bilaterally, bilateral crackles, no wheezes Cardiovascular system: S1 & S2+. No rubs, gallops or clicks.  Gastrointestinal system: Abdomen is obese, soft and nontender. Normal bowel sounds heard. Central nervous system: Alert and awake.  Mild left facial droop, left-sided weakness power 4/5 no any other focal deficits  Psychiatry: Judgement and insight appears at baseline. Flat mood and affec     Data Reviewed: I have personally reviewed following labs and imaging studies  CBC: Recent Labs  Lab 10/14/23 1946 10/15/23 0425 10/16/23 0423 10/16/23 1611 10/17/23 0412  10/18/23 0333 10/19/23 0434  WBC 11.8*   < > 14.0* 11.7* 9.8 9.7 8.3  NEUTROABS 10.0*  --   --   --   --   --   --   HGB 14.9   < > 13.7 13.4 13.3 14.1 14.0  HCT 45.5   < > 41.0 39.8 39.4 42.0 40.8  MCV 96.6   < > 95.3 95.4 94.7 94.6 91.3  PLT 152   < > 134* 131* 140* 160 168   < > = values in this interval not displayed.   Basic Metabolic Panel: Recent Labs  Lab 10/15/23 0425 10/16/23 0423 10/17/23 0412 10/17/23 0413 10/18/23 0333 10/19/23 0434  NA 138 137  --  140 136 139  K 4.0 3.5  --  3.1* 3.7 4.1  CL 112* 111  --  106 103 102  CO2 18* 19*  --  22 24 28   GLUCOSE 166* 109*  --  88 109* 118*  BUN 24* 24*  --  24* 23 24*  CREATININE 1.05* 0.84  --  0.75 0.80 0.74  CALCIUM  7.7* 8.1*  --  8.5* 8.1* 8.5*  MG 1.8 2.3 2.1  --  1.9 1.9  PHOS  --  3.0  --  1.9* 3.5 3.4   GFR: Estimated Creatinine Clearance: 45.5 mL/min (by C-G formula based on SCr of 0.74 mg/dL). Liver Function Tests: Recent Labs  Lab 10/15/23 0425 10/16/23 0423 10/17/23 0412 10/17/23 0413 10/18/23 0333 10/19/23 0434  AST 76* 42* 37  --  39 43*  ALT 60* 48* 36  --  33 30  ALKPHOS 248* 200* 172*  --  188* 201*  BILITOT 1.6* 1.2 0.9  --  0.9 1.0  PROT 6.1* 6.0* 6.0*  --  6.4* 6.2*  ALBUMIN  2.6* 2.5* 2.7* 2.8* 2.8* 2.8*   No results for input(s): "LIPASE", "AMYLASE" in the last 168 hours. No results for input(s): "AMMONIA" in the last 168 hours. Coagulation Profile: Recent Labs  Lab 10/14/23 1946  INR 1.2   Cardiac Enzymes: No results for input(s): "CKTOTAL", "CKMB", "CKMBINDEX", "TROPONINI" in the last 168 hours. BNP (last 3 results) No results for input(s): "PROBNP" in the last 8760 hours. HbA1C: No results for input(s): "HGBA1C" in the last 72 hours. CBG: No results for input(s): "GLUCAP" in the last 168 hours. Lipid Profile: No results for input(s): "CHOL", "HDL", "LDLCALC", "TRIG", "CHOLHDL", "LDLDIRECT" in the last 72 hours. Thyroid  Function Tests: No results for input(s): "TSH",  "T4TOTAL", "FREET4", "T3FREE", "THYROIDAB" in the last 72 hours. Anemia Panel: No results for input(s): "VITAMINB12", "FOLATE", "FERRITIN", "TIBC", "IRON", "RETICCTPCT" in the last 72 hours. Sepsis Labs: Recent Labs  Lab 10/14/23 1950 10/14/23 2126 10/15/23 1507  LATICACIDVEN 3.2* 2.5* 1.3    Recent Results (from the past 240 hours)  Blood Culture (routine x 2)  Status: Abnormal   Collection Time: 10/14/23  7:42 PM   Specimen: BLOOD  Result Value Ref Range Status   Specimen Description   Final    BLOOD BLOOD RIGHT ARM Performed at [facility], [address], [place], [place] [address]    Special Requests   Final    BOTTLES DRAWN AEROBIC AND ANAEROBIC Blood Culture adequate volume Performed at [facility], [address] [place], [place] [address]    Culture  Setup Time   Final    GRAM POSITIVE COCCI AEROBIC BOTTLE ONLY CRITICAL RESULT CALLED TO, READ BACK BY AND VERIFIED WITH: JASON ROBBINS 10/16/23 0538 MW    Culture (A)  Final    STAPHYLOCOCCUS HAEMOLYTICUS THE SIGNIFICANCE OF ISOLATING THIS ORGANISM FROM A SINGLE SET OF BLOOD CULTURES WHEN MULTIPLE SETS ARE DRAWN IS UNCERTAIN. PLEASE NOTIFY THE MICROBIOLOGY DEPARTMENT WITHIN ONE WEEK IF SPECIATION AND SENSITIVITIES ARE REQUIRED. Performed at [facility], [address], [place], [place] [address]    Report Status 10/18/2023 FINAL  Final  Blood Culture ID Panel (Reflexed)     Status: Abnormal   Collection Time: 10/14/23  7:42 PM  Result Value Ref Range Status   Enterococcus faecalis NOT DETECTED NOT DETECTED Final   Enterococcus Faecium NOT DETECTED NOT DETECTED Final   Listeria monocytogenes NOT DETECTED NOT DETECTED Final   Staphylococcus species DETECTED (A) NOT DETECTED Final    Comment: CRITICAL RESULT CALLED TO, READ BACK BY AND VERIFIED WITH: JASON ROBBINS 10/16/23 0538 MW    Staphylococcus aureus (BCID) NOT DETECTED NOT DETECTED Final   Staphylococcus epidermidis NOT DETECTED  NOT DETECTED Final   Staphylococcus lugdunensis NOT DETECTED NOT DETECTED Final   Streptococcus species NOT DETECTED NOT DETECTED Final   Streptococcus agalactiae NOT DETECTED NOT DETECTED Final   Streptococcus pneumoniae NOT DETECTED NOT DETECTED Final   Streptococcus pyogenes NOT DETECTED NOT DETECTED Final   A.calcoaceticus-baumannii NOT DETECTED NOT DETECTED Final   Bacteroides fragilis NOT DETECTED NOT DETECTED Final   Enterobacterales NOT DETECTED NOT DETECTED Final   Enterobacter cloacae complex NOT DETECTED NOT DETECTED Final   Escherichia coli NOT DETECTED NOT DETECTED Final   Klebsiella aerogenes NOT DETECTED NOT DETECTED Final   Klebsiella oxytoca NOT DETECTED NOT DETECTED Final   Klebsiella pneumoniae NOT DETECTED NOT DETECTED Final   Proteus species NOT DETECTED NOT DETECTED Final   Salmonella species NOT DETECTED NOT DETECTED Final   Serratia marcescens NOT DETECTED NOT DETECTED Final   Haemophilus influenzae NOT DETECTED NOT DETECTED Final   Neisseria meningitidis NOT DETECTED NOT DETECTED Final   Pseudomonas aeruginosa NOT DETECTED NOT DETECTED Final   Stenotrophomonas maltophilia NOT DETECTED NOT DETECTED Final   Candida albicans NOT DETECTED NOT DETECTED Final   Candida auris NOT DETECTED NOT DETECTED Final   Candida glabrata NOT DETECTED NOT DETECTED Final   Candida krusei NOT DETECTED NOT DETECTED Final   Candida parapsilosis NOT DETECTED NOT DETECTED Final   Candida tropicalis NOT DETECTED NOT DETECTED Final   Cryptococcus neoformans/gattii NOT DETECTED NOT DETECTED Final    Comment: Performed at [facility], [address] [place], [place] [address]  Blood Culture (routine x 2)     Status: None   Collection Time: 10/14/23  7:50 PM   Specimen: BLOOD  Result Value Ref Range Status   Specimen Description BLOOD BLOOD LEFT FOREARM  Final   Special Requests   Final    BOTTLES DRAWN AEROBIC AND ANAEROBIC Blood Culture adequate volume  Culture   Final     NO GROWTH 5 DAYS Performed at [facility], [address] [place], [place] [address]    Report Status 10/19/2023 FINAL  Final  Resp panel by RT-PCR (RSV, Flu A&B, Covid) Anterior Nasal Swab     Status: None   Collection Time: 10/14/23  8:01 PM   Specimen: Anterior Nasal Swab  Result Value Ref Range Status   SARS Coronavirus 2 by RT PCR NEGATIVE NEGATIVE Final    Comment: (NOTE) SARS-CoV-2 target nucleic acids are NOT DETECTED.  The SARS-CoV-2 RNA is generally detectable in upper respiratory specimens during the acute phase of infection. The lowest concentration of SARS-CoV-2 viral copies this assay can detect is 138 copies/mL. A negative result does not preclude SARS-Cov-2 infection and should not be used as the sole basis for treatment or other patient management decisions. A negative result may occur with  improper specimen collection/handling, submission of specimen other than nasopharyngeal swab, presence of viral mutation(s) within the areas targeted by this assay, and inadequate number of viral copies(<138 copies/mL). A negative result must be combined with clinical observations, patient history, and epidemiological information. The expected result is Negative.  Fact Sheet for Patients:  BloggerCourse.com  Fact Sheet for Healthcare Providers:  SeriousBroker.it  This test is no t yet approved or cleared by the [place]  FDA and  has been authorized for detection and/or diagnosis of SARS-CoV-2 by FDA under an Emergency Use Authorization (EUA). This EUA will remain  in effect (meaning this test can be used) for the duration of the COVID-19 declaration under Section 564(b)(1) of the Act, 21 U.S.C.section 360bbb-3(b)(1), unless the authorization is terminated  or revoked sooner.       Influenza A by PCR NEGATIVE NEGATIVE Final   Influenza B by PCR NEGATIVE NEGATIVE Final    Comment: (NOTE) The Xpert Xpress  SARS-CoV-2/FLU/RSV plus assay is intended as an aid in the diagnosis of influenza from Nasopharyngeal swab specimens and should not be used as a sole basis for treatment. Nasal washings and aspirates are unacceptable for Xpert Xpress SARS-CoV-2/FLU/RSV testing.  Fact Sheet for Patients: BloggerCourse.com  Fact Sheet for Healthcare Providers: SeriousBroker.it  This test is not yet approved or cleared by the [place]  FDA and has been authorized for detection and/or diagnosis of SARS-CoV-2 by FDA under an Emergency Use Authorization (EUA). This EUA will remain in effect (meaning this test can be used) for the duration of the COVID-19 declaration under Section 564(b)(1) of the Act, 21 U.S.C. section 360bbb-3(b)(1), unless the authorization is terminated or revoked.     Resp Syncytial Virus by PCR NEGATIVE NEGATIVE Final    Comment: (NOTE) Fact Sheet for Patients: BloggerCourse.com  Fact Sheet for Healthcare Providers: SeriousBroker.it  This test is not yet approved or cleared by the [place]  FDA and has been authorized for detection and/or diagnosis of SARS-CoV-2 by FDA under an Emergency Use Authorization (EUA). This EUA will remain in effect (meaning this test can be used) for the duration of the COVID-19 declaration under Section 564(b)(1) of the Act, 21 U.S.C. section 360bbb-3(b)(1), unless the authorization is terminated or revoked.  Performed at [facility], [address] [place], [place] [address]   Culture, blood (Routine X 2) w Reflex to ID Panel     Status: None (Preliminary result)   Collection Time: 10/16/23 10:16 AM   Specimen: BLOOD  Result Value Ref Range Status   Specimen Description BLOOD BLOOD LEFT ARM  Final  Special Requests   Final    BOTTLES DRAWN AEROBIC AND ANAEROBIC Blood Culture adequate volume   Culture   Final    NO GROWTH 3  DAYS Performed at [facility], [address] [place], [place] [address]    Report Status PENDING  Incomplete  Culture, blood (Routine X 2) w Reflex to ID Panel     Status: None (Preliminary result)   Collection Time: 10/16/23 10:16 AM   Specimen: BLOOD  Result Value Ref Range Status   Specimen Description BLOOD BLOOD LEFT HAND  Final   Special Requests   Final    BOTTLES DRAWN AEROBIC ONLY Blood Culture results may not be optimal due to an inadequate volume of blood received in culture bottles   Culture   Final    NO GROWTH 3 DAYS Performed at [facility], [address] [place], [place] [address]    Report Status PENDING  Incomplete  Body fluid culture w Gram Stain     Status: None (Preliminary result)   Collection Time: 10/17/23  4:20 PM   Specimen: PATH Cytology Pleural fluid  Result Value Ref Range Status   Specimen Description   Final    FLUID Performed at [facility], [address], [place], [place] [address]    Special Requests   Final    CYTO PLEU Performed at [facility], [address] [place], [place] [address]    Gram Stain   Final    FEW WBC PRESENT, PREDOMINANTLY MONONUCLEAR NO ORGANISMS SEEN    Culture   Final    NO GROWTH 2 DAYS Performed at [facility], [address] [place], [place] [address]    Report Status PENDING  Incomplete         Radiology Studies: US  CHEST (PLEURAL EFFUSION) Result Date: 10/18/2023 CLINICAL DATA:  89 year old female with prior left-sided thoracentesis EXAM: CHEST ULTRASOUND COMPARISON:  None Available. FINDINGS: Ultrasound demonstrates scant right-sided pleural fluid. Thoracentesis deferred at this time. IMPRESSION: Scant right pleural fluid Electronically Signed   By: Myrlene Asper D.O.   On: 10/18/2023 11:09   DG Chest Port 1 View Result Date: 10/17/2023 CLINICAL DATA:  Status post thoracentesis EXAM: PORTABLE CHEST 1 VIEW COMPARISON:  Chest x-ray 10/15/2023 FINDINGS:  Heart is enlarged. There is central pulmonary vascular congestion. There is a small left pleural effusion which has slightly decreased from prior. No pneumothorax visualized. There is atelectasis in the left lung base and right mid lung. No acute fractures are seen. IMPRESSION: 1. Small left pleural effusion which has slightly decreased from prior. No pneumothorax visualized. 2. Cardiomegaly with central pulmonary vascular congestion. Electronically Signed   By: Tyron Gallon M.D.   On: 10/17/2023 19:22   US  THORACENTESIS ASP PLEURAL SPACE W/IMG GUIDE Result Date: 10/17/2023 INDICATION: Patient with history of cirrhosis, hospitalized for stroke. During hospitalization, team noted bilateral pleural effusions; request for thoracentesis. Has not received thoracentesis before. EXAM: ULTRASOUND GUIDED DIAGNOSTIC AND THERAPEUTIC THORACENTESIS MEDICATIONS: 10 mL 1% lidocaine  COMPLICATIONS: None immediate. PROCEDURE: An ultrasound guided thoracentesis was thoroughly discussed with the patient and questions answered. The benefits, risks, alternatives and complications were also discussed. The patient understands and wishes to proceed with the procedure. Written consent was obtained. Ultrasound was performed to localize and mark an adequate pocket of fluid in the left chest. The area was then prepped and draped in the normal sterile fashion. 1% Lidocaine  was used for local anesthesia. Under ultrasound guidance a 6 Fr Safe-T-Centesis  catheter was introduced. Thoracentesis was performed. The catheter was removed and a dressing applied. FINDINGS: A total of approximately 650 milliliters of amber fluid was removed. Samples were sent to the laboratory as requested by the clinical team. IMPRESSION: Successful ultrasound guided left diagnostic and therapeutic thoracentesis yielding 650 milliliters of pleural fluid. A post-procedural chest x-ray was ordered. Performed by Terressa Fess, NP Electronically Signed   By: Elene Griffes M.D.   On: 10/17/2023 17:59        Scheduled Meds:   stroke: early stages of recovery book   Does not apply Once   aspirin  EC  81 mg Oral Q0600   atorvastatin   40 mg Oral Daily   cycloSPORINE   1 drop Both Eyes BID   enoxaparin  (LOVENOX ) injection  40 mg Subcutaneous Q24H   famotidine   20 mg Oral BID   feeding supplement  237 mL Oral TID BM   furosemide   40 mg Intravenous Q12H   midodrine   5 mg Oral TID WC   potassium chloride   40 mEq Oral Daily   prednisoLONE  acetate  2 drop Left Eye BID   sodium chloride  flush  3 mL Intravenous Q12H   timolol   1 drop Both Eyes Daily   umeclidinium-vilanterol  1 puff Inhalation Daily   valACYclovir   500 mg Oral QHS   Continuous Infusions:     LOS: 5 days    Total time spent: 55 minutes   Althia Atlas, MD [facility] Pager [phone]  If 7PM-7AM, please contact night-coverage [email or web address] 10/19/2023, 4:41 PM

## 2023-10-19 NOTE — Consult Note (Signed)
 PHARMACY CONSULT NOTE - ELECTROLYTES  Pharmacy Consult for Electrolyte Monitoring and Replacement   Recent Labs: Potassium (mmol/L)  Date Value  10/19/2023 4.1   Magnesium  (mg/dL)  Date Value  09/81/1914 1.9   Calcium  (mg/dL)  Date Value  78/29/5621 8.5 (L)   Albumin  (g/dL)  Date Value  30/86/5784 2.8 (L)   Phosphorus (mg/dL)  Date Value  69/62/9528 3.4   Sodium (mmol/L)  Date Value  10/19/2023 139   Height: 5\' 2"  (157.5 cm) Weight: 76.2 kg (167 lb 14.4 oz) IBW/kg (Calculated) : 50.1 Estimated Creatinine Clearance: 45.5 mL/min (by C-G formula based on SCr of 0.74 mg/dL).  Assessment  Lauren Morris is a 89 y.o. female presenting with septic shock. PMH significant for recurrent Klebsiella UTI, type 2 diabetes, Graves' disease now with acquired hypothyroidism, hypertension, hyperlipidemia, OSA on CPAP, recurrent bronchitis, meningioma . Pharmacy has been consulted to monitor and replace electrolytes.  On lasix  IV 40 mg BID + Kcl 40 mEq daily  Diet: Regular diet Pertinent medications: N/A  Goal of Therapy: Electrolytes within normal limits  Plan:  --Stable on current regimen, continue to monitor  Page Boast  6:53 AM

## 2023-10-20 DIAGNOSIS — R6521 Severe sepsis with septic shock: Secondary | ICD-10-CM | POA: Diagnosis not present

## 2023-10-20 DIAGNOSIS — A419 Sepsis, unspecified organism: Secondary | ICD-10-CM | POA: Diagnosis not present

## 2023-10-20 LAB — BASIC METABOLIC PANEL WITH GFR
Anion gap: 8 (ref 5–15)
BUN: 30 mg/dL — ABNORMAL HIGH (ref 8–23)
CO2: 28 mmol/L (ref 22–32)
Calcium: 9 mg/dL (ref 8.9–10.3)
Chloride: 98 mmol/L (ref 98–111)
Creatinine, Ser: 0.98 mg/dL (ref 0.44–1.00)
GFR, Estimated: 55 mL/min — ABNORMAL LOW (ref >60)
Glucose, Bld: 125 mg/dL — ABNORMAL HIGH (ref 70–99)
Potassium: 4.2 mmol/L (ref 3.5–5.1)
Sodium: 134 mmol/L — ABNORMAL LOW (ref 135–145)

## 2023-10-20 LAB — CBC
HCT: 42.2 % (ref 36.0–46.0)
Hemoglobin: 14.5 g/dL (ref 12.0–15.0)
MCH: 31.6 pg (ref 26.0–34.0)
MCHC: 34.4 g/dL (ref 30.0–36.0)
MCV: 91.9 fL (ref 80.0–100.0)
Platelets: 199 10*3/uL (ref 150–400)
RBC: 4.59 MIL/uL (ref 3.87–5.11)
RDW: 13.9 % (ref 11.5–15.5)
WBC: 9.1 10*3/uL (ref 4.0–10.5)
nRBC: 0 % (ref 0.0–0.2)

## 2023-10-20 MED ORDER — POTASSIUM CHLORIDE CRYS ER 20 MEQ PO TBCR
20.0000 meq | EXTENDED_RELEASE_TABLET | Freq: Every day | ORAL | Status: DC
  Administered 2023-10-20 – 2023-10-22 (×3): 20 meq via ORAL
  Filled 2023-10-20 (×3): qty 1

## 2023-10-20 NOTE — Plan of Care (Signed)
  Problem: Fluid Volume: Goal: Hemodynamic stability will improve Outcome: Progressing   Problem: Clinical Measurements: Goal: Diagnostic test results will improve Outcome: Progressing Goal: Signs and symptoms of infection will decrease Outcome: Progressing   Problem: Respiratory: Goal: Ability to maintain adequate ventilation will improve Outcome: Progressing   Problem: Education: Goal: Knowledge of disease or condition will improve Outcome: Progressing Goal: Knowledge of secondary prevention will improve (MUST DOCUMENT ALL) Outcome: Progressing Goal: Knowledge of patient specific risk factors will improve (DELETE if not current risk factor) Outcome: Progressing   Problem: Ischemic Stroke/TIA Tissue Perfusion: Goal: Complications of ischemic stroke/TIA will be minimized Outcome: Progressing   Problem: Coping: Goal: Will verbalize positive feelings about self Outcome: Progressing Goal: Will identify appropriate support needs Outcome: Progressing   Problem: Health Behavior/Discharge Planning: Goal: Ability to manage health-related needs will improve Outcome: Progressing Goal: Goals will be collaboratively established with patient/family Outcome: Progressing   Problem: Self-Care: Goal: Ability to participate in self-care as condition permits will improve Outcome: Progressing Goal: Verbalization of feelings and concerns over difficulty with self-care will improve Outcome: Progressing Goal: Ability to communicate needs accurately will improve Outcome: Progressing   Problem: Nutrition: Goal: Risk of aspiration will decrease Outcome: Progressing Goal: Dietary intake will improve Outcome: Progressing   Problem: Education: Goal: Knowledge of General Education information will improve Description: Including pain rating scale, medication(s)/side effects and non-pharmacologic comfort measures Outcome: Progressing   Problem: Health Behavior/Discharge Planning: Goal:  Ability to manage health-related needs will improve Outcome: Progressing   Problem: Clinical Measurements: Goal: Ability to maintain clinical measurements within normal limits will improve Outcome: Progressing Goal: Will remain free from infection Outcome: Progressing Goal: Diagnostic test results will improve Outcome: Progressing Goal: Respiratory complications will improve Outcome: Progressing Goal: Cardiovascular complication will be avoided Outcome: Progressing   Problem: Activity: Goal: Risk for activity intolerance will decrease Outcome: Progressing   Problem: Nutrition: Goal: Adequate nutrition will be maintained Outcome: Progressing   Problem: Coping: Goal: Level of anxiety will decrease Outcome: Progressing   Problem: Elimination: Goal: Will not experience complications related to bowel motility Outcome: Progressing Goal: Will not experience complications related to urinary retention Outcome: Progressing   Problem: Pain Managment: Goal: General experience of comfort will improve and/or be controlled Outcome: Progressing   Problem: Safety: Goal: Ability to remain free from injury will improve Outcome: Progressing   Problem: Skin Integrity: Goal: Risk for impaired skin integrity will decrease Outcome: Progressing

## 2023-10-20 NOTE — TOC Progression Note (Signed)
 Transition of Care [facility]) - Progression Note    Patient Details  Name: Lauren Morris MRN: [id number] Date of Birth: May 28, 1932  Transition of Care [facility]) CM/SW Contact  Areta Beer, RN Phone Number: 10/20/2023, 12:53 PM  Clinical Narrative:  4/27: Left message for daughter, Starlene Eaton, to call RN CM back with call back number left on generic voice mail.    Katheryn Pandy MSN RN CM  RN Case Manager [place]  Transitions of Care Direct Dial: [phone] (Weekends Only) [facility] Main Office Phone: [phone] [facility] Fax: [phone] [email or web address]      Expected Discharge Plan: Skilled Nursing Facility Barriers to Discharge: Continued Medical Work up  Expected Discharge Plan and Services     Post Acute Care Choice: Skilled Nursing Facility Living arrangements for the past 2 months: Single Family Home                                       Social Determinants of Health (SDOH) Interventions SDOH Screenings   Food Insecurity: No Food Insecurity (10/15/2023)  Housing: Low Risk  (10/15/2023)  Transportation Needs: No Transportation Needs (10/15/2023)  Utilities: Not At Risk (10/15/2023)  Depression (PHQ2-9): Low Risk  (09/10/2023)  Social Connections: Moderately Integrated (10/15/2023)  Tobacco Use: Low Risk  (10/15/2023)    Readmission Risk Interventions     No data to display

## 2023-10-20 NOTE — Consult Note (Signed)
 PHARMACY CONSULT NOTE - ELECTROLYTES  Pharmacy Consult for Electrolyte Monitoring and Replacement   Recent Labs: Potassium (mmol/L)  Date Value  10/20/2023 4.2   Magnesium  (mg/dL)  Date Value  47/82/9562 1.9   Calcium  (mg/dL)  Date Value  13/01/6577 9.0   Albumin  (g/dL)  Date Value  46/96/2952 2.8 (L)   Phosphorus (mg/dL)  Date Value  84/13/2440 3.4   Sodium (mmol/L)  Date Value  10/20/2023 134 (L)   Height: 5\' 2"  (157.5 cm) Weight: 76.2 kg (167 lb 14.4 oz) IBW/kg (Calculated) : 50.1 Estimated Creatinine Clearance: 37.2 mL/min (by C-G formula based on SCr of 0.98 mg/dL).  Assessment  Lauren Morris is a 89 y.o. female presenting with septic shock. PMH significant for recurrent Klebsiella UTI, type 2 diabetes, Graves' disease now with acquired hypothyroidism, hypertension, hyperlipidemia, OSA on CPAP, recurrent bronchitis, meningioma . Pharmacy has been consulted to monitor and replace electrolytes.  On Lasix  IV 40 mg BID + Kcl 40 mEq daily >> Lasix  40 mg PO daily  Diet: Regular diet Pertinent medications: N/A  Goal of Therapy: Electrolytes within normal limits  Plan:  --Given change in diuretic regimen will decrease potassium replacement from 40 mEq daily to 20 mEq daily --Will sign off on consult given overall stability and decrease in intensity of diuretic regimen  Shi Blankenship B Amadeus Oyama  6:46 AM

## 2023-10-20 NOTE — Progress Notes (Signed)
 PROGRESS NOTE   Lauren Morris  Lauren Morris:[id number] DOB: [birth year] DOA: 10/14/2023 PCP: Thersia Flax, MD   HPI was taken from Dr. Guss Morris: [name] is a 89 y.o. female with medical history significant of recurrent Klebsiella UTI, type 2 diabetes, Graves' disease now with acquired hypothyroidism, hypertension, hyperlipidemia, OSA on CPAP, recurrent bronchitis, meningioma, who presents to the ED due to altered mental status.   History obtained from both Lauren Morris and her daughter at bedside.  Lauren Morris states that over the last 1 day, she has noticed increasing difficulty with walking, and this usually happens when she has an underlying UTI.  Her daughter at bedside states that yesterday, Lauren Morris slept all day, which is unusual for her.  Then she checked on her today at 8 AM, and she seemed close to her baseline.  When she checked on her again at approximately 5 or 6 PM, Lauren Morris was laying in her recliner, and still in her pajamas.  She has not gotten up or eaten all day.  She states that this is markedly unusual as Lauren Morris usually sticks to a schedule.  She seemed confused at the time but was still able to answer questions appropriately.  Mrs. Lauren Morris denies any nausea, vomiting, chest pain, shortness of breath, palpitations, lower extremity swelling, cough, rhinorrhea, sinus congestion.  She denies any urinary symptoms at this time other than frequency, which she states is chronic.   ED course: On arrival to the ED, patient was hypotensive at 84/66 with heart rate of 87.  She was saturating at 92% on room air.  She was afebrile at 99.7.  She was tachypneic at 24/min.  Initial workup notable for WBC of 11.8, potassium 3.2, bicarb 21, creatinine 1.35, BUN 24, alkaline phosphatase 328, AST 97, ALT 69, total bilirubin 1.9, GFR 38.  Lactic acid 3.2.  Influenza, RSV and COVID-19 negative.  Chest x-ray and CT head obtained with results pending.  Patient given 2 L of IV fluids, Zosyn  and vancomycin .  TRH  contacted for admission.    Assessment & Plan:   Principal Problem:   Septic shock (HCC) Active Problems:   UTI (urinary tract infection)   AKI (acute kidney injury) (HCC)   Essential hypertension   Acquired hypothyroidism   Type 2 diabetes mellitus (HCC)   Fatty liver disease, nonalcoholic   COPD (chronic obstructive pulmonary disease) (HCC)   OSA (obstructive sleep apnea)   Calculus of gallbladder without cholecystitis without obstruction  Assessment and Plan:  # Septic shock: met criteria w/ AMS, hypotension, tachypnea, leukocytosis, elevated lactic acid & questionable acute cholecystitis vs unlikely UTI.  S/p IV rocephin  and IV flagyl .  Transition to oral antibiotics Augmentin  BID, patient received 2 doses and developed redness on the face, suspected due to Augmentin  so discontinued. No source of infection identified.  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 TTE LVEF 60 to 65%, no WMA, grade 2 diastolic dysfunction, severe PAH, moderate left pleural effusion, no significant valvular abnormality. Continue w/ neuro checks. Neuro consulted, commended aspirin  and statin, no need of DAPT, patient may need surgical intervention. 4/26 worsening of left-sided weakness, repeat MRI brain showed normal expected interval evolution of previously identified right middle ganglia infarct. No evidence for hemorrhagic transformation or other complication. No other new acute intracranial abnormality. D/w neurology, recommended no intervention, continue same management.   # Cholecystitis ruled out.  As per US  but  no abd pain, nausea or vomiting. Likely a poor surgical candidate & pt does not want a percutaneous drain currently.  Continue on IV rocephin  and start IV flagyl .  Blood culture 1/2 growing staph species, d/w ID, rec repeat blood cultures Gen surg following and recs MRI of the liver, which is negative for  cholecystitis.  Showed ascites and bilateral pleural effusions. 4/25 DC'd Augmentin  due to facial rash, redness on the face, no any other symptoms vitals stable.  Solu-Medrol  40 mg IV and Benadryl  12.5 mg IV one-time dose given We will continue to monitor closely   # Acute hypoxic respiratory failure, with pulmonary edema and bilateral pleural effusion, could be due to pneumonia CXR: Bilateral reticular interstitial infiltrates with a small bilateral pleural effusions left more than right. Left lower lobe atelectasis. Findings could correlate with congestive changes, pneumonia not excluded. Negative COVID, RSV and flu Continue above antibiotics Trend WBC count Lactic acid 2.5--1.3 trended down 4/23 Lasix  80 mg x 1 dose given Continue DuoNeb as needed 4/24 bilateral pleural effusion, IR consulted for thoracentesis, s/p left thoracentesis 650 mL fluid was tapped.  Partially exudative due to high LDH and neutrophils fluid culture no growth 4/24 --4/26 Lasix  40 mg IV twice daily  4/25 IR repeated ultrasound on right side but there is not enough fluid to do thoracentesis. Transition to Lasix  40 mg p.o. daily from 4/27   # Hypokalemia, potassium repleted.  # Hypophosphatemia, Phos repleted. Monitor electrolytes and replete as needed.  Unlikely UTI: UA is positive for rare bacteria only.  4/26 c/o dysuria, repeat UA negative    AKI: likely secondary to sepsis  Cr 1.35---0.84 resolved  OSA: CPAP qhs   Liver cirrhosis with ascites, nonalcoholic. Hx of fatty liver disease. US  shows cirrhosis & likely acute cholecystitis  MRI positive for cirrhosis, ascites and bilateral effusion Started Lasix  40 mg p.o. daily  Transaminitis: likely secondary to NAFLD & cirrhosis.  Transaminitis resolved, alk phos is improving  DM2: well controlled, HbA1c 5.9. No need for SSI currently    Hypothyroidism: continue home dose Levothyroxine    HTN: holding all home anti-HTN meds secondary to  hypotension  Patient was on telmisartan  40 mg p.o. daily and atenolol  25 mg at twice daily at home Monitor BP and titrate medications accordingly  # Metabolic acidosis, resolved s/p bicarb 3 times daily for 2 days Monitor BMP   DVT prophylaxis: lovenox   Code Status: DNR Family Communication: discussed pt's care w/ pt's daughter at bedside and answered her questions  Disposition Plan: depends on PT/OT recs  Level of care: Progressive  Status is: Inpatient Remains inpatient appropriate because: severity of illness    Consultants:  Neuro Gen surg   Procedures:   Antimicrobials: s/p rocephin , flagyl  S/p Augmentin    Subjective: No significant overnight events, patient is feeling improvement in the left-sided weakness, still has mild shortness of breath, no chest pain or palpitation, no any other complaints   Objective: Vitals:   10/20/23 0040 10/20/23 0439 10/20/23 0810 10/20/23 1150  BP: (!) 124/55 115/61 (!) 119/59 126/73  Pulse: (!) 107 91 88 86  Resp: 18  18 18   Temp: 98.1 F (36.7 C)  97.9 F (36.6 C) 97.7 F (36.5 C)  TempSrc:   Oral   SpO2: 92% 92% 92% 96%  Weight:      Height:        Intake/Output Summary (Last 24 hours) at 10/20/2023 1451 Last data filed at 10/20/2023 1046 Gross per 24 hour  Intake 480  ml  Output 200 ml  Net 280 ml   Filed Weights   10/14/23 1940  Weight: 76.2 kg    Examination:  General exam: Appears calm and comfortable  Respiratory system: Equal air entry bilaterally, mild b/l crackles, no wheezes Cardiovascular system: S1 & S2+. No rubs, gallops or clicks.  Gastrointestinal system: Abdomen is obese, soft and nontender. Normal bowel sounds heard. Central nervous system: Alert and awake.  Mild left facial droop, left-sided weakness power 4/5 no any other focal deficits  Psychiatry: Judgement and insight appears at baseline. Flat mood and affec     Data Reviewed: I have personally reviewed following labs and imaging  studies  CBC: Recent Labs  Lab 10/14/23 1946 10/15/23 0425 10/16/23 1611 10/17/23 0412 10/18/23 0333 10/19/23 0434 10/20/23 0428  WBC 11.8*   < > 11.7* 9.8 9.7 8.3 9.1  NEUTROABS 10.0*  --   --   --   --   --   --   HGB 14.9   < > 13.4 13.3 14.1 14.0 14.5  HCT 45.5   < > 39.8 39.4 42.0 40.8 42.2  MCV 96.6   < > 95.4 94.7 94.6 91.3 91.9  PLT 152   < > 131* 140* 160 168 199   < > = values in this interval not displayed.   Morris Metabolic Panel: Recent Labs  Lab 10/15/23 0425 10/16/23 0423 10/17/23 1610 10/17/23 0413 10/18/23 0333 10/19/23 0434 10/20/23 0428  NA 138 137  --  140 136 139 134*  K 4.0 3.5  --  3.1* 3.7 4.1 4.2  CL 112* 111  --  106 103 102 98  CO2 18* 19*  --  22 24 28 28   GLUCOSE 166* 109*  --  88 109* 118* 125*  BUN 24* 24*  --  24* 23 24* 30*  CREATININE 1.05* 0.84  --  0.75 0.80 0.74 0.98  CALCIUM  7.7* 8.1*  --  8.5* 8.1* 8.5* 9.0  MG 1.8 2.3 2.1  --  1.9 1.9  --   PHOS  --  3.0  --  1.9* 3.5 3.4  --    GFR: Estimated Creatinine Clearance: 37.2 mL/min (by C-G formula based on SCr of 0.98 mg/dL). Liver Function Tests: Recent Labs  Lab 10/15/23 0425 10/16/23 0423 10/17/23 0412 10/17/23 0413 10/18/23 0333 10/19/23 0434  AST 76* 42* 37  --  39 43*  ALT 60* 48* 36  --  33 30  ALKPHOS 248* 200* 172*  --  188* 201*  BILITOT 1.6* 1.2 0.9  --  0.9 1.0  PROT 6.1* 6.0* 6.0*  --  6.4* 6.2*  ALBUMIN  2.6* 2.5* 2.7* 2.8* 2.8* 2.8*   No results for input(s): "LIPASE", "AMYLASE" in the last 168 hours. No results for input(s): "AMMONIA" in the last 168 hours. Coagulation Profile: Recent Labs  Lab 10/14/23 1946  INR 1.2   Cardiac Enzymes: No results for input(s): "CKTOTAL", "CKMB", "CKMBINDEX", "TROPONINI" in the last 168 hours. BNP (last 3 results) No results for input(s): "PROBNP" in the last 8760 hours. HbA1C: No results for input(s): "HGBA1C" in the last 72 hours. CBG: No results for input(s): "GLUCAP" in the last 168 hours. Lipid  Profile: No results for input(s): "CHOL", "HDL", "LDLCALC", "TRIG", "CHOLHDL", "LDLDIRECT" in the last 72 hours. Thyroid  Function Tests: No results for input(s): "TSH", "T4TOTAL", "FREET4", "T3FREE", "THYROIDAB" in the last 72 hours. Anemia Panel: No results for input(s): "VITAMINB12", "FOLATE", "FERRITIN", "TIBC", "IRON", "RETICCTPCT" in the last 72 hours. Sepsis  Labs: Recent Labs  Lab 10/14/23 1950 10/14/23 2126 10/15/23 1507  LATICACIDVEN 3.2* 2.5* 1.3    Recent Results (from the past 240 hours)  Blood Culture (routine x 2)     Status: Abnormal   Collection Time: 10/14/23  7:42 PM   Specimen: BLOOD  Result Value Ref Range Status   Specimen Description   Final    BLOOD BLOOD RIGHT ARM Performed at [facility], [address], [place], [place] [address]    Special Requests   Final    BOTTLES DRAWN AEROBIC AND ANAEROBIC Blood Culture adequate volume Performed at [facility], [address] [place], [place] [address]    Culture  Setup Time   Final    GRAM POSITIVE COCCI AEROBIC BOTTLE ONLY CRITICAL RESULT CALLED TO, READ BACK BY AND VERIFIED WITH: JASON ROBBINS 10/16/23 0538 MW    Culture (A)  Final    STAPHYLOCOCCUS HAEMOLYTICUS THE SIGNIFICANCE OF ISOLATING THIS ORGANISM FROM A SINGLE SET OF BLOOD CULTURES WHEN MULTIPLE SETS ARE DRAWN IS UNCERTAIN. PLEASE NOTIFY THE MICROBIOLOGY DEPARTMENT WITHIN ONE WEEK IF SPECIATION AND SENSITIVITIES ARE REQUIRED. Performed at [facility], [address] [place], [place] [address]    Report Status 10/18/2023 FINAL  Final  Blood Culture ID Panel (Reflexed)     Status: Abnormal   Collection Time: 10/14/23  7:42 PM  Result Value Ref Range Status   Enterococcus faecalis NOT DETECTED NOT DETECTED Final   Enterococcus Faecium NOT DETECTED NOT DETECTED Final   Listeria monocytogenes NOT DETECTED NOT DETECTED Final   Staphylococcus species DETECTED (A) NOT DETECTED Final    Comment: CRITICAL RESULT CALLED TO,  READ BACK BY AND VERIFIED WITH: JASON ROBBINS 10/16/23 0538 MW    Staphylococcus aureus (BCID) NOT DETECTED NOT DETECTED Final   Staphylococcus epidermidis NOT DETECTED NOT DETECTED Final   Staphylococcus lugdunensis NOT DETECTED NOT DETECTED Final   Streptococcus species NOT DETECTED NOT DETECTED Final   Streptococcus agalactiae NOT DETECTED NOT DETECTED Final   Streptococcus pneumoniae NOT DETECTED NOT DETECTED Final   Streptococcus pyogenes NOT DETECTED NOT DETECTED Final   A.calcoaceticus-baumannii NOT DETECTED NOT DETECTED Final   Bacteroides fragilis NOT DETECTED NOT DETECTED Final   Enterobacterales NOT DETECTED NOT DETECTED Final   Enterobacter cloacae complex NOT DETECTED NOT DETECTED Final   Escherichia coli NOT DETECTED NOT DETECTED Final   Klebsiella aerogenes NOT DETECTED NOT DETECTED Final   Klebsiella oxytoca NOT DETECTED NOT DETECTED Final   Klebsiella pneumoniae NOT DETECTED NOT DETECTED Final   Proteus species NOT DETECTED NOT DETECTED Final   Salmonella species NOT DETECTED NOT DETECTED Final   Serratia marcescens NOT DETECTED NOT DETECTED Final   Haemophilus influenzae NOT DETECTED NOT DETECTED Final   Neisseria meningitidis NOT DETECTED NOT DETECTED Final   Pseudomonas aeruginosa NOT DETECTED NOT DETECTED Final   Stenotrophomonas maltophilia NOT DETECTED NOT DETECTED Final   Candida albicans NOT DETECTED NOT DETECTED Final   Candida auris NOT DETECTED NOT DETECTED Final   Candida glabrata NOT DETECTED NOT DETECTED Final   Candida krusei NOT DETECTED NOT DETECTED Final   Candida parapsilosis NOT DETECTED NOT DETECTED Final   Candida tropicalis NOT DETECTED NOT DETECTED Final   Cryptococcus neoformans/gattii NOT DETECTED NOT DETECTED Final    Comment: Performed at [facility], [address] [place], [place] [address]  Blood Culture (routine x 2)     Status: None   Collection Time: 10/14/23  7:50 PM   Specimen: BLOOD  Result Value Ref Range Status    Specimen Description BLOOD BLOOD LEFT FOREARM  Final   Special Requests   Final    BOTTLES DRAWN AEROBIC AND ANAEROBIC Blood Culture adequate volume   Culture   Final    NO GROWTH 5 DAYS Performed at [facility], [address] [place], [place] [address]    Report Status 10/19/2023 FINAL  Final  Resp panel by RT-PCR (RSV, Flu A&B, Covid) Anterior Nasal Swab     Status: None   Collection Time: 10/14/23  8:01 PM   Specimen: Anterior Nasal Swab  Result Value Ref Range Status   SARS Coronavirus 2 by RT PCR NEGATIVE NEGATIVE Final    Comment: (NOTE) SARS-CoV-2 target nucleic acids are NOT DETECTED.  The SARS-CoV-2 RNA is generally detectable in upper respiratory specimens during the acute phase of infection. The lowest concentration of SARS-CoV-2 viral copies this assay can detect is 138 copies/mL. A negative result does not preclude SARS-Cov-2 infection and should not be used as the sole basis for treatment or other patient management decisions. A negative result may occur with  improper specimen collection/handling, submission of specimen other than nasopharyngeal swab, presence of viral mutation(s) within the areas targeted by this assay, and inadequate number of viral copies(<138 copies/mL). A negative result must be combined with clinical observations, patient history, and epidemiological information. The expected result is Negative.  Fact Sheet for Patients:  BloggerCourse.com  Fact Sheet for Healthcare Providers:  SeriousBroker.it  This test is no t yet approved or cleared by the [place]  FDA and  has been authorized for detection and/or diagnosis of SARS-CoV-2 by FDA under an Emergency Use Authorization (EUA). This EUA will remain  in effect (meaning this test can be used) for the duration of the COVID-19 declaration under Section 564(b)(1) of the Act, 21 U.S.C.section 360bbb-3(b)(1), unless the authorization  is terminated  or revoked sooner.       Influenza A by PCR NEGATIVE NEGATIVE Final   Influenza B by PCR NEGATIVE NEGATIVE Final    Comment: (NOTE) The Xpert Xpress SARS-CoV-2/FLU/RSV plus assay is intended as an aid in the diagnosis of influenza from Nasopharyngeal swab specimens and should not be used as a sole basis for treatment. Nasal washings and aspirates are unacceptable for Xpert Xpress SARS-CoV-2/FLU/RSV testing.  Fact Sheet for Patients: BloggerCourse.com  Fact Sheet for Healthcare Providers: SeriousBroker.it  This test is not yet approved or cleared by the [place]  FDA and has been authorized for detection and/or diagnosis of SARS-CoV-2 by FDA under an Emergency Use Authorization (EUA). This EUA will remain in effect (meaning this test can be used) for the duration of the COVID-19 declaration under Section 564(b)(1) of the Act, 21 U.S.C. section 360bbb-3(b)(1), unless the authorization is terminated or revoked.     Resp Syncytial Virus by PCR NEGATIVE NEGATIVE Final    Comment: (NOTE) Fact Sheet for Patients: BloggerCourse.com  Fact Sheet for Healthcare Providers: SeriousBroker.it  This test is not yet approved or cleared by the [place]  FDA and has been authorized for detection and/or diagnosis of SARS-CoV-2 by FDA under an Emergency Use Authorization (EUA). This EUA will remain in effect (meaning this test can be used) for the duration of the COVID-19 declaration under Section 564(b)(1) of the Act, 21 U.S.C. section 360bbb-3(b)(1), unless the authorization is terminated or revoked.  Performed at [facility], [address] [place], [place] [address]   Culture, blood (Routine X 2) w Reflex to ID Panel  Status: None (Preliminary result)   Collection Time: 10/16/23 10:16 AM   Specimen: BLOOD  Result Value Ref Range Status   Specimen  Description BLOOD BLOOD LEFT ARM  Final   Special Requests   Final    BOTTLES DRAWN AEROBIC AND ANAEROBIC Blood Culture adequate volume   Culture   Final    NO GROWTH 4 DAYS Performed at [facility], [address] [place], [place] [address]    Report Status PENDING  Incomplete  Culture, blood (Routine X 2) w Reflex to ID Panel     Status: None (Preliminary result)   Collection Time: 10/16/23 10:16 AM   Specimen: BLOOD  Result Value Ref Range Status   Specimen Description BLOOD BLOOD LEFT HAND  Final   Special Requests   Final    BOTTLES DRAWN AEROBIC ONLY Blood Culture results may not be optimal due to an inadequate volume of blood received in culture bottles   Culture   Final    NO GROWTH 4 DAYS Performed at [facility], [address] [place], [place] [address]    Report Status PENDING  Incomplete  Body fluid culture w Gram Stain     Status: None (Preliminary result)   Collection Time: 10/17/23  4:20 PM   Specimen: PATH Cytology Pleural fluid  Result Value Ref Range Status   Specimen Description   Final    FLUID Performed at [facility], [address] [place], [place] [address]    Special Requests   Final    CYTO PLEU Performed at [facility], [address] [place], [place] [address]    Gram Stain   Final    FEW WBC PRESENT, PREDOMINANTLY MONONUCLEAR NO ORGANISMS SEEN    Culture   Final    NO GROWTH 3 DAYS Performed at [facility], [address] [place], [place] [address]    Report Status PENDING  Incomplete         Radiology Studies: MR BRAIN WO CONTRAST Result Date: 10/19/2023 CLINICAL DATA:  Follow-up examination for stroke. EXAM: MRI HEAD WITHOUT CONTRAST TECHNIQUE: Multiplanar, multiecho pulse sequences of the brain and surrounding structures were obtained without intravenous contrast. COMPARISON:  Prior MRI from 10/15/2023 FINDINGS: Brain: Age-related cerebral atrophy with mild chronic microvascular  ischemic disease. There has been normal expected interval evolution of previously identified right basal ganglia infarct, relatively stable in size and distribution from prior. No evidence for hemorrhagic transformation or other complication. No significant regional mass effect. No other areas of new or interval infarction. No acute or chronic intracranial hemorrhage. Small meningioma at the right posterior fossa without significant mass effect or edema, stable. No other mass lesion or mass effect. No midline shift. No hydrocephalus. No extra-axial fluid collection. Pituitary gland within normal limits. Vascular: Major intracranial vascular flow voids are maintained. Skull and upper cervical spine: Craniocervical junction within normal limits. Bone marrow signal intensity normal. No scalp soft tissue abnormality. Sinuses/Orbits: Prior bilateral ocular lens replacement. Mild left frontoethmoidal sinus disease. Trace bilateral mastoid effusions, of doubtful significance. Other: None. IMPRESSION: 1. Normal expected interval evolution of previously identified right basal ganglia infarct. No evidence for hemorrhagic transformation or other complication. 2. No other new acute intracranial abnormality. 3. Age-related cerebral atrophy with mild chronic microvascular ischemic disease. 4. Small right posterior fossa meningioma without significant mass effect or edema, stable. Electronically Signed   By: Virgia Griffins M.D.   On: 10/19/2023 18:10  Scheduled Meds:   stroke: early stages of recovery book   Does not apply Once   aspirin  EC  81 mg Oral Q0600   atorvastatin   40 mg Oral Daily   cycloSPORINE   1 drop Both Eyes BID   enoxaparin  (LOVENOX ) injection  40 mg Subcutaneous Q24H   famotidine   20 mg Oral BID   feeding supplement  237 mL Oral TID BM   furosemide   40 mg Oral Daily   midodrine   5 mg Oral TID WC   pantoprazole  40 mg Oral Daily   potassium chloride   20 mEq Oral Daily   prednisoLONE   acetate  2 drop Left Eye BID   sodium chloride  flush  3 mL Intravenous Q12H   timolol   1 drop Both Eyes Daily   umeclidinium-vilanterol  1 puff Inhalation Daily   valACYclovir   500 mg Oral QHS   Continuous Infusions:     LOS: 6 days    Total time spent: 40 minutes   Althia Atlas, MD [facility] Pager [phone]  If 7PM-7AM, please contact night-coverage [email or web address] 10/20/2023, 2:51 PM

## 2023-10-21 ENCOUNTER — Inpatient Hospital Stay

## 2023-10-21 DIAGNOSIS — A419 Sepsis, unspecified organism: Secondary | ICD-10-CM | POA: Diagnosis not present

## 2023-10-21 DIAGNOSIS — R6521 Severe sepsis with septic shock: Secondary | ICD-10-CM | POA: Diagnosis not present

## 2023-10-21 LAB — HEPATIC FUNCTION PANEL
ALT: 37 U/L (ref 0–44)
AST: 67 U/L — ABNORMAL HIGH (ref 15–41)
Albumin: 2.8 g/dL — ABNORMAL LOW (ref 3.5–5.0)
Alkaline Phosphatase: 293 U/L — ABNORMAL HIGH (ref 38–126)
Bilirubin, Direct: 0.5 mg/dL — ABNORMAL HIGH (ref 0.0–0.2)
Indirect Bilirubin: 0.7 mg/dL (ref 0.3–0.9)
Total Bilirubin: 1.2 mg/dL (ref 0.0–1.2)
Total Protein: 6.3 g/dL — ABNORMAL LOW (ref 6.5–8.1)

## 2023-10-21 LAB — BASIC METABOLIC PANEL WITH GFR
Anion gap: 8 (ref 5–15)
BUN: 32 mg/dL — ABNORMAL HIGH (ref 8–23)
CO2: 28 mmol/L (ref 22–32)
Calcium: 8.8 mg/dL — ABNORMAL LOW (ref 8.9–10.3)
Chloride: 99 mmol/L (ref 98–111)
Creatinine, Ser: 0.79 mg/dL (ref 0.44–1.00)
GFR, Estimated: >60 mL/min (ref >60)
Glucose, Bld: 126 mg/dL — ABNORMAL HIGH (ref 70–99)
Potassium: 4.5 mmol/L (ref 3.5–5.1)
Sodium: 135 mmol/L (ref 135–145)

## 2023-10-21 LAB — CBC
HCT: 40.7 % (ref 36.0–46.0)
Hemoglobin: 13.9 g/dL (ref 12.0–15.0)
MCH: 31.4 pg (ref 26.0–34.0)
MCHC: 34.2 g/dL (ref 30.0–36.0)
MCV: 91.9 fL (ref 80.0–100.0)
Platelets: 200 10*3/uL (ref 150–400)
RBC: 4.43 MIL/uL (ref 3.87–5.11)
RDW: 13.8 % (ref 11.5–15.5)
WBC: 8.6 10*3/uL (ref 4.0–10.5)
nRBC: 0 % (ref 0.0–0.2)

## 2023-10-21 LAB — CULTURE, BLOOD (ROUTINE X 2)
Culture: NO GROWTH
Culture: NO GROWTH
Special Requests: ADEQUATE

## 2023-10-21 LAB — URINE CULTURE: Culture: >=100000 — AB

## 2023-10-21 LAB — BODY FLUID CULTURE W GRAM STAIN: Culture: NO GROWTH

## 2023-10-21 NOTE — Progress Notes (Signed)
 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 PMH significant for Klebsiella UTI, type 2 diabetes, Graves' disease now with acquired hypothyroidism, hypertension, hyperlipidemia, OSA on CPAP, recurrent bronchitis, meningioma    PT Comments  Pt was pleasant and motivated to participate during the session and put forth good effort throughout. Pt required cuing, extra time and effort, and min A with bed mobility tasks and transfers as well as cuing to assist with obstacle avoidance during gait training.  Pt was generally steady with gait but with very slow cadence, short bilateral step length, and occasional shuffling.  Pt reported no adverse symptoms during the session with SpO2 and HR WNL throughout on 2L. Pt will benefit from continued PT services upon discharge to safely address deficits listed in patient problem list for decreased caregiver assistance and eventual return to PLOF.       If plan is discharge home, recommend the following: A little help with walking and/or transfers;A little help with bathing/dressing/bathroom;Assistance with cooking/housework;Assist for transportation;Help with stairs or ramp for entrance   Can travel by private vehicle     Yes  Equipment Recommendations  Other (comment) (TBD at next level of care)    Recommendations for Other Services       Precautions / Restrictions Precautions Precautions: Fall Recall of Precautions/Restrictions: Intact Restrictions Weight Bearing Restrictions Per Provider Order: No     Mobility  Bed Mobility Overal bed mobility: Needs Assistance Bed Mobility: Supine to Sit, Rolling Rolling: Min assist   Supine to  sit: Min assist     General bed mobility comments: Min A for BLE and trunk control with use of the bed rail to assist with sup to sit and rolling left/right    Transfers Overall transfer level: Needs assistance Equipment used: Rolling walker (2 wheels) Transfers: Sit to/from Stand Sit to Stand: Min assist           General transfer comment: Min verbal cues for hand placement and increased trunk flexion    Ambulation/Gait Ambulation/Gait assistance: Contact guard assist Gait Distance (Feet): 40 Feet x 1, 20 Feet x 1 Assistive device: Rolling walker (2 wheels) Gait Pattern/deviations: Trunk flexed, Decreased step length - right, Step-through pattern, Decreased step length - left, Shuffle Gait velocity: decreased     General Gait Details: Very slow cadence with occasional shuffling steps but generally steady with no overt LOB, occasional min verbal cues to assist with avoiding obstacles on the L   Stairs             Wheelchair Mobility     Tilt Bed    Modified Rankin (Stroke Patients Only)       Balance Overall balance assessment: Needs assistance Sitting-balance support: Feet supported, Single extremity supported Sitting balance-Leahy Scale: Fair     Standing balance support: Bilateral upper extremity supported, During functional activity, Reliant on assistive device for balance Standing balance-Leahy Scale: Fair                              Hotel manager: No apparent difficulties  Cognition Arousal: Alert Behavior During Therapy: WFL for tasks assessed/performed   PT - Cognitive impairments: No  apparent impairments                         Following commands: Intact      Cueing Cueing Techniques: Verbal cues, Visual cues  Exercises Total Joint Exercises Ankle Circles/Pumps: AROM, Strengthening, Both, 10 reps Quad Sets: Strengthening, Both, 10 reps Gluteal Sets: Strengthening, Both, 10 reps Heel  Slides: AROM, Strengthening, Both, 10 reps Long Arc Quad: Strengthening, Both, 10 reps Knee Flexion: Strengthening, Both, 10 reps    General Comments        Pertinent Vitals/Pain Pain Assessment Pain Assessment: No/denies pain    Home Living                          Prior Function            PT Goals (current goals can now be found in the care plan section) Progress towards PT goals: Progressing toward goals    Frequency    Min 3X/week      PT Plan      Co-evaluation              AM-PAC PT "6 Clicks" Mobility   Outcome Measure  Help needed turning from your back to your side while in a flat bed without using bedrails?: A Little Help needed moving from lying on your back to sitting on the side of a flat bed without using bedrails?: A Little Help needed moving to and from a bed to a chair (including a wheelchair)?: A Little Help needed standing up from a chair using your arms (e.g., wheelchair or bedside chair)?: A Little Help needed to walk in hospital room?: A Little Help needed climbing 3-5 steps with a railing? : A Lot 6 Click Score: 17    End of Session Equipment Utilized During Treatment: Oxygen ;Gait belt Activity Tolerance: Patient tolerated treatment well Patient left: in chair;with call bell/phone within reach;with chair alarm set;with family/visitor present Nurse Communication: Mobility status PT Visit Diagnosis: Difficulty in walking, not elsewhere classified (R26.2);Muscle weakness (generalized) (M62.81);Hemiplegia and hemiparesis Hemiplegia - Right/Left: Left Hemiplegia - dominant/non-dominant: Non-dominant     Time: 2952-8413 PT Time Calculation (min) (ACUTE ONLY): 33 min  Charges:    $Gait Training: 8-22 mins $Therapeutic Exercise: 8-22 mins PT General Charges $$ ACUTE PT VISIT: 1 Visit                     D. Scott Tayna Smethurst PT, DPT 10/21/23, 11:30 AM

## 2023-10-21 NOTE — TOC Progression Note (Addendum)
 Transition of Care [facility]) - Progression Note    Patient Details  Name: Lauren Morris MRN: [id number] Date of Birth: 1931/07/11  [facility] CM/SW Contact  Odilia Bennett, LCSW Phone Number: 10/21/2023, 10:35 AM  Clinical Narrative:  Lauren Morris does not have any bed availability and likely will not have any openings this week.   12:14 pm: Patient has two visitors. Daughter not at bedside but should be here soon. Will provide bed offers once she arrives.  12:43 pm: Met with daughter. Provided update and bed offers. She stated they would likely choose [facility].  3:35 pm: Daughter accepted offer from [facility]. Admissions coordinator is aware. Daughter voiced concerns about her discharging while still on acute oxygen  and with new incontinence. She would like to review questions regarding plan of care going forward with the MD. CSW sent secure chat to MD to notify.  Expected Discharge Plan: Skilled Nursing Facility Barriers to Discharge: Continued Medical Work up  Expected Discharge Plan and Services     Post Acute Care Choice: Skilled Nursing Facility Living arrangements for the past 2 months: Single Family Home                                       Social Determinants of Health (SDOH) Interventions SDOH Screenings   Food Insecurity: No Food Insecurity (10/15/2023)  Housing: Low Risk  (10/15/2023)  Transportation Needs: No Transportation Needs (10/15/2023)  Utilities: Not At Risk (10/15/2023)  Depression (PHQ2-9): Low Risk  (09/10/2023)  Social Connections: Moderately Integrated (10/15/2023)  Tobacco Use: Low Risk  (10/15/2023)    Readmission Risk Interventions     No data to display

## 2023-10-21 NOTE — Plan of Care (Signed)
  Problem: Fluid Volume: Goal: Hemodynamic stability will improve Outcome: Progressing   Problem: Clinical Measurements: Goal: Diagnostic test results will improve Outcome: Progressing Goal: Signs and symptoms of infection will decrease Outcome: Progressing   Problem: Respiratory: Goal: Ability to maintain adequate ventilation will improve Outcome: Progressing   Problem: Education: Goal: Knowledge of disease or condition will improve Outcome: Progressing Goal: Knowledge of secondary prevention will improve (MUST DOCUMENT ALL) Outcome: Progressing Goal: Knowledge of patient specific risk factors will improve (DELETE if not current risk factor) Outcome: Progressing   Problem: Ischemic Stroke/TIA Tissue Perfusion: Goal: Complications of ischemic stroke/TIA will be minimized Outcome: Progressing   Problem: Coping: Goal: Will verbalize positive feelings about self Outcome: Progressing Goal: Will identify appropriate support needs Outcome: Progressing   Problem: Health Behavior/Discharge Planning: Goal: Ability to manage health-related needs will improve Outcome: Progressing Goal: Goals will be collaboratively established with patient/family Outcome: Progressing   Problem: Self-Care: Goal: Ability to participate in self-care as condition permits will improve Outcome: Progressing Goal: Verbalization of feelings and concerns over difficulty with self-care will improve Outcome: Progressing Goal: Ability to communicate needs accurately will improve Outcome: Progressing   Problem: Nutrition: Goal: Risk of aspiration will decrease Outcome: Progressing Goal: Dietary intake will improve Outcome: Progressing   Problem: Education: Goal: Knowledge of General Education information will improve Description: Including pain rating scale, medication(s)/side effects and non-pharmacologic comfort measures Outcome: Progressing   Problem: Health Behavior/Discharge Planning: Goal:  Ability to manage health-related needs will improve Outcome: Progressing   Problem: Clinical Measurements: Goal: Ability to maintain clinical measurements within normal limits will improve Outcome: Progressing Goal: Will remain free from infection Outcome: Progressing Goal: Diagnostic test results will improve Outcome: Progressing Goal: Respiratory complications will improve Outcome: Progressing Goal: Cardiovascular complication will be avoided Outcome: Progressing   Problem: Activity: Goal: Risk for activity intolerance will decrease Outcome: Progressing   Problem: Nutrition: Goal: Adequate nutrition will be maintained Outcome: Progressing   Problem: Coping: Goal: Level of anxiety will decrease Outcome: Progressing   Problem: Elimination: Goal: Will not experience complications related to bowel motility Outcome: Progressing Goal: Will not experience complications related to urinary retention Outcome: Progressing   Problem: Pain Managment: Goal: General experience of comfort will improve and/or be controlled Outcome: Progressing   Problem: Safety: Goal: Ability to remain free from injury will improve Outcome: Progressing   Problem: Skin Integrity: Goal: Risk for impaired skin integrity will decrease Outcome: Progressing

## 2023-10-21 NOTE — Progress Notes (Signed)
 PROGRESS NOTE   Lauren Morris  ZOX:[id number] DOB: 04-07-1932 DOA: 10/14/2023 PCP: Thersia Flax, MD   HPI was taken from Dr. Guss Legacy: Lauren Morris is a 89 y.o. female with medical history significant of recurrent Klebsiella UTI, type 2 diabetes, Graves' disease now with acquired hypothyroidism, hypertension, hyperlipidemia, OSA on CPAP, recurrent bronchitis, meningioma, who presents to the ED due to altered mental status.   History obtained from both Lauren Morris and her daughter at bedside.  Lauren Morris states that over the last 1 day, she has noticed increasing difficulty with walking, and this usually happens when she has an underlying UTI.  Her daughter at bedside states that yesterday, Lauren Morris slept all day, which is unusual for her.  Then she checked on her today at 8 AM, and she seemed close to her baseline.  When she checked on her again at approximately 5 or 6 PM, Lauren Morris was laying in her recliner, and still in her pajamas.  She has not gotten up or eaten all day.  She states that this is markedly unusual as Lauren Morris usually sticks to a schedule.  She seemed confused at the time but was still able to answer questions appropriately.  Lauren Morris denies any nausea, vomiting, chest pain, shortness of breath, palpitations, lower extremity swelling, cough, rhinorrhea, sinus congestion.  She denies any urinary symptoms at this time other than frequency, which she states is chronic.   ED course: On arrival to the ED, patient was hypotensive at 84/66 with heart rate of 87.  She was saturating at 92% on room air.  She was afebrile at 99.7.  She was tachypneic at 24/min.  Initial workup notable for WBC of 11.8, potassium 3.2, bicarb 21, creatinine 1.35, BUN 24, alkaline phosphatase 328, AST 97, ALT 69, total bilirubin 1.9, GFR 38.  Lactic acid 3.2.  Influenza, RSV and COVID-19 negative.  Chest x-ray and CT head obtained with results pending.  Patient given 2 L of IV fluids, Zosyn  and vancomycin .  TRH  contacted for admission.    Assessment & Plan:   Principal Problem:   Septic shock (HCC) Active Problems:   UTI (urinary tract infection)   AKI (acute kidney injury) (HCC)   Essential hypertension   Acquired hypothyroidism   Type 2 diabetes mellitus (HCC)   Fatty liver disease, nonalcoholic   COPD (chronic obstructive pulmonary disease) (HCC)   OSA (obstructive sleep apnea)   Calculus of gallbladder without cholecystitis without obstruction   # Septic shock: met criteria w/ AMS, hypotension, tachypnea, leukocytosis, elevated lactic acid & questionable acute cholecystitis vs unlikely UTI.  S/p IV rocephin  and IV flagyl .  Transition to oral antibiotics Augmentin  BID, patient received 2 doses and developed redness on the face, suspected due to Augmentin  so discontinued. No source of infection identified.  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 TTE LVEF 60 to 65%, no WMA, grade 2 diastolic dysfunction, severe PAH, moderate left pleural effusion, no significant valvular abnormality. Continue w/ neuro checks. Neuro consulted, commended aspirin  and statin, no need of DAPT, patient may need surgical intervention. 4/26 worsening of left-sided weakness, repeat MRI brain showed normal expected interval evolution of previously identified right middle ganglia infarct. No evidence for hemorrhagic transformation or other complication. No other new acute intracranial abnormality. D/w neurology, recommended no intervention, continue same management.   # Cholecystitis ruled out.  As per US  but no abd pain,  nausea or vomiting. Likely a poor surgical candidate & pt does not want a percutaneous drain currently.  Continue on IV rocephin  and start IV flagyl .  Blood culture 1/2 growing staph species, d/w ID, rec repeat blood cultures Gen surg following and recs MRI of the liver, which is negative for cholecystitis.  Showed ascites  and bilateral pleural effusions. 4/25 DC'd Augmentin  due to facial rash, redness on the face, no any other symptoms vitals stable.  Solu-Medrol  40 mg IV and Benadryl  12.5 mg IV one-time dose given We will continue to monitor closely   # Acute hypoxic respiratory failure, with pulmonary edema and bilateral pleural effusion, could be due to pneumonia CXR: Bilateral reticular interstitial infiltrates with a small bilateral pleural effusions left more than right. Left lower lobe atelectasis. Findings could correlate with congestive changes, pneumonia not excluded. Negative COVID, RSV and flu, s/p antibiotics Lactic acid 2.5--1.3 trended down 4/23 Lasix  80 mg x 1 dose given, Continue DuoNeb as needed 4/24 bilateral pleural effusion, IR consulted for thoracentesis, s/p left thoracentesis 650 mL fluid was tapped.  Partially exudative due to high LDH and neutrophils fluid culture no growth 4/24 --4/26 Lasix  40 mg IV twice daily  4/25 IR repeated ultrasound on right side but there is not enough fluid to do thoracentesis. Transition to Lasix  40 mg p.o. daily from 4/27 4/28 CXR: Mild atelectasis and mild left pleural effusion.  Limited x-ray by myself, pulmonary edema seems improving  # Hypokalemia, potassium repleted.  # Hypophosphatemia, Phos repleted. Monitor electrolytes and replete as needed.  # Unlikely UTI: UA is positive for rare bacteria only.  4/26 c/o dysuria, repeat UA negative    # AKI: likely secondary to sepsis  Cr 1.35---0.84 resolved  OSA: CPAP qhs   # Liver cirrhosis with ascites, nonalcoholic. Hx of fatty liver disease. US  shows cirrhosis & likely acute cholecystitis  MRI positive for cirrhosis, ascites and bilateral effusion Started Lasix  40 mg p.o. daily  # Transaminitis: likely secondary to NAFLD & cirrhosis.  Transaminitis resolved, alk phos is still elevated, recommend to repeat LFTs after 1 week.  # DM2: well controlled, HbA1c 5.9. No need for SSI currently     # Hypothyroidism: continue home dose Levothyroxine    # HTN: holding all home anti-HTN meds secondary to hypotension  Patient was on telmisartan  40 mg p.o. daily and atenolol  25 mg at twice daily at home Monitor BP and titrate medications accordingly  # Metabolic acidosis, resolved s/p bicarb 3 times daily for 2 days Monitor BMP   DVT prophylaxis: lovenox   Code Status: DNR Family Communication: discussed pt's care w/ pt's daughter at bedside and answered her questions  Disposition Plan: SNF placement, most likely tomorrow a.m. Follow TOC, patient will go to [facility] tomorrow a.m.  Level of care: Progressive  Status is: Inpatient Remains inpatient appropriate because: severity of illness    Consultants:  Neuro Gen surg   Procedures:   Antimicrobials: s/p rocephin , flagyl  S/p Augmentin    Subjective: No significant overnight events, patient's breathing is improving, still has mild shortness of breath.  She was sitting comfortably in the recliner.  No any acute distress.  Denied any complaints   Objective: Vitals:   10/20/23 2314 10/21/23 0344 10/21/23 0844 10/21/23 1117  BP: (!) 149/55 109/78 132/64 115/63  Pulse: 92 81 92 87  Resp:  (!) 21    Temp: 98.5 F (36.9 C) 98.2 F (36.8 C) 97.8 F (36.6 C) 97.8 F (36.6 C)  TempSrc: Oral Oral  SpO2: 95% 96% 96% 99%  Weight:      Height:        Intake/Output Summary (Last 24 hours) at 10/21/2023 1540 Last data filed at 10/21/2023 1000 Gross per 24 hour  Intake 357 ml  Output 450 ml  Net -93 ml   Filed Weights   10/14/23 1940  Weight: 76.2 kg    Examination:  General exam: Appears calm and comfortable  Respiratory system: Equal air entry bilaterally, mild b/l crackles, no wheezes Cardiovascular system: S1 & S2+. No rubs, gallops or clicks.  Gastrointestinal system: Abdomen is obese, soft and nontender. Normal bowel sounds heard. Central nervous system: Alert and awake.  Mild left facial droop,  left-sided weakness power 4/5 no any other focal deficits  Psychiatry: Judgement and insight appears at baseline. Flat mood and affec     Data Reviewed: I have personally reviewed following labs and imaging studies  CBC: Recent Labs  Lab 10/14/23 1946 10/15/23 0425 10/17/23 0412 10/18/23 0333 10/19/23 0434 10/20/23 0428 10/21/23 0435  WBC 11.8*   < > 9.8 9.7 8.3 9.1 8.6  NEUTROABS 10.0*  --   --   --   --   --   --   HGB 14.9   < > 13.3 14.1 14.0 14.5 13.9  HCT 45.5   < > 39.4 42.0 40.8 42.2 40.7  MCV 96.6   < > 94.7 94.6 91.3 91.9 91.9  PLT 152   < > 140* 160 168 199 200   < > = values in this interval not displayed.   Basic Metabolic Panel: Recent Labs  Lab 10/15/23 0425 10/16/23 0423 10/17/23 8416 10/17/23 0413 10/18/23 0333 10/19/23 0434 10/20/23 0428 10/21/23 0435  NA 138 137  --  140 136 139 134* 135  K 4.0 3.5  --  3.1* 3.7 4.1 4.2 4.5  CL 112* 111  --  106 103 102 98 99  CO2 18* 19*  --  22 24 28 28 28   GLUCOSE 166* 109*  --  88 109* 118* 125* 126*  BUN 24* 24*  --  24* 23 24* 30* 32*  CREATININE 1.05* 0.84  --  0.75 0.80 0.74 0.98 0.79  CALCIUM  7.7* 8.1*  --  8.5* 8.1* 8.5* 9.0 8.8*  MG 1.8 2.3 2.1  --  1.9 1.9  --   --   PHOS  --  3.0  --  1.9* 3.5 3.4  --   --    GFR: Estimated Creatinine Clearance: 45.5 mL/min (by C-G formula based on SCr of 0.79 mg/dL). Liver Function Tests: Recent Labs  Lab 10/16/23 0423 10/17/23 0412 10/17/23 0413 10/18/23 0333 10/19/23 0434 10/21/23 0435  AST 42* 37  --  39 43* 67*  ALT 48* 36  --  33 30 37  ALKPHOS 200* 172*  --  188* 201* 293*  BILITOT 1.2 0.9  --  0.9 1.0 1.2  PROT 6.0* 6.0*  --  6.4* 6.2* 6.3*  ALBUMIN  2.5* 2.7* 2.8* 2.8* 2.8* 2.8*   No results for input(s): "LIPASE", "AMYLASE" in the last 168 hours. No results for input(s): "AMMONIA" in the last 168 hours. Coagulation Profile: Recent Labs  Lab 10/14/23 1946  INR 1.2   Cardiac Enzymes: No results for input(s): "CKTOTAL", "CKMB",  "CKMBINDEX", "TROPONINI" in the last 168 hours. BNP (last 3 results) No results for input(s): "PROBNP" in the last 8760 hours. HbA1C: No results for input(s): "HGBA1C" in the last 72 hours. CBG: No results for input(s): "GLUCAP"  in the last 168 hours. Lipid Profile: No results for input(s): "CHOL", "HDL", "LDLCALC", "TRIG", "CHOLHDL", "LDLDIRECT" in the last 72 hours. Thyroid  Function Tests: No results for input(s): "TSH", "T4TOTAL", "FREET4", "T3FREE", "THYROIDAB" in the last 72 hours. Anemia Panel: No results for input(s): "VITAMINB12", "FOLATE", "FERRITIN", "TIBC", "IRON", "RETICCTPCT" in the last 72 hours. Sepsis Labs: Recent Labs  Lab 10/14/23 1950 10/14/23 2126 10/15/23 1507  LATICACIDVEN 3.2* 2.5* 1.3    Recent Results (from the past 240 hours)  Blood Culture (routine x 2)     Status: Abnormal   Collection Time: 10/14/23  7:42 PM   Specimen: BLOOD  Result Value Ref Range Status   Specimen Description   Final    BLOOD BLOOD RIGHT ARM Performed at [facility], [address] [place], [place] [address]    Special Requests   Final    BOTTLES DRAWN AEROBIC AND ANAEROBIC Blood Culture adequate volume Performed at [facility], [address] [place], [place] [address]    Culture  Setup Time   Final    GRAM POSITIVE COCCI AEROBIC BOTTLE ONLY CRITICAL RESULT CALLED TO, READ BACK BY AND VERIFIED WITH: JASON ROBBINS 10/16/23 0538 MW    Culture (A)  Final    STAPHYLOCOCCUS HAEMOLYTICUS THE SIGNIFICANCE OF ISOLATING THIS ORGANISM FROM A SINGLE SET OF BLOOD CULTURES WHEN MULTIPLE SETS ARE DRAWN IS UNCERTAIN. PLEASE NOTIFY THE MICROBIOLOGY DEPARTMENT WITHIN ONE WEEK IF SPECIATION AND SENSITIVITIES ARE REQUIRED. Performed at [facility], [address] [place], [place] [address]    Report Status 10/18/2023 FINAL  Final  Blood Culture ID Panel (Reflexed)     Status: Abnormal   Collection Time: 10/14/23  7:42 PM  Result Value Ref Range Status    Enterococcus faecalis NOT DETECTED NOT DETECTED Final   Enterococcus Faecium NOT DETECTED NOT DETECTED Final   Listeria monocytogenes NOT DETECTED NOT DETECTED Final   Staphylococcus species DETECTED (A) NOT DETECTED Final    Comment: CRITICAL RESULT CALLED TO, READ BACK BY AND VERIFIED WITH: JASON ROBBINS 10/16/23 0538 MW    Staphylococcus aureus (BCID) NOT DETECTED NOT DETECTED Final   Staphylococcus epidermidis NOT DETECTED NOT DETECTED Final   Staphylococcus lugdunensis NOT DETECTED NOT DETECTED Final   Streptococcus species NOT DETECTED NOT DETECTED Final   Streptococcus agalactiae NOT DETECTED NOT DETECTED Final   Streptococcus pneumoniae NOT DETECTED NOT DETECTED Final   Streptococcus pyogenes NOT DETECTED NOT DETECTED Final   A.calcoaceticus-baumannii NOT DETECTED NOT DETECTED Final   Bacteroides fragilis NOT DETECTED NOT DETECTED Final   Enterobacterales NOT DETECTED NOT DETECTED Final   Enterobacter cloacae complex NOT DETECTED NOT DETECTED Final   Escherichia coli NOT DETECTED NOT DETECTED Final   Klebsiella aerogenes NOT DETECTED NOT DETECTED Final   Klebsiella oxytoca NOT DETECTED NOT DETECTED Final   Klebsiella pneumoniae NOT DETECTED NOT DETECTED Final   Proteus species NOT DETECTED NOT DETECTED Final   Salmonella species NOT DETECTED NOT DETECTED Final   Serratia marcescens NOT DETECTED NOT DETECTED Final   Haemophilus influenzae NOT DETECTED NOT DETECTED Final   Neisseria meningitidis NOT DETECTED NOT DETECTED Final   Pseudomonas aeruginosa NOT DETECTED NOT DETECTED Final   Stenotrophomonas maltophilia NOT DETECTED NOT DETECTED Final   Candida albicans NOT DETECTED NOT DETECTED Final   Candida auris NOT DETECTED NOT DETECTED Final   Candida glabrata NOT DETECTED NOT DETECTED Final   Candida krusei NOT DETECTED NOT DETECTED Final   Candida parapsilosis NOT DETECTED NOT DETECTED Final  Candida tropicalis NOT DETECTED NOT DETECTED Final   Cryptococcus  neoformans/gattii NOT DETECTED NOT DETECTED Final    Comment: Performed at [facility], [address] [place], [place] [address]  Blood Culture (routine x 2)     Status: None   Collection Time: 10/14/23  7:50 PM   Specimen: BLOOD  Result Value Ref Range Status   Specimen Description BLOOD BLOOD LEFT FOREARM  Final   Special Requests   Final    BOTTLES DRAWN AEROBIC AND ANAEROBIC Blood Culture adequate volume   Culture   Final    NO GROWTH 5 DAYS Performed at [facility], [address] [place], [place] [address]    Report Status 10/19/2023 FINAL  Final  Resp panel by RT-PCR (RSV, Flu A&B, Covid) Anterior Nasal Swab     Status: None   Collection Time: 10/14/23  8:01 PM   Specimen: Anterior Nasal Swab  Result Value Ref Range Status   SARS Coronavirus 2 by RT PCR NEGATIVE NEGATIVE Final    Comment: (NOTE) SARS-CoV-2 target nucleic acids are NOT DETECTED.  The SARS-CoV-2 RNA is generally detectable in upper respiratory specimens during the acute phase of infection. The lowest concentration of SARS-CoV-2 viral copies this assay can detect is 138 copies/mL. A negative result does not preclude SARS-Cov-2 infection and should not be used as the sole basis for treatment or other patient management decisions. A negative result may occur with  improper specimen collection/handling, submission of specimen other than nasopharyngeal swab, presence of viral mutation(s) within the areas targeted by this assay, and inadequate number of viral copies(<138 copies/mL). A negative result must be combined with clinical observations, patient history, and epidemiological information. The expected result is Negative.  Fact Sheet for Patients:  BloggerCourse.com  Fact Sheet for Healthcare Providers:  SeriousBroker.it  This test is no t yet approved or cleared by the [place]  FDA and  has been authorized for detection and/or  diagnosis of SARS-CoV-2 by FDA under an Emergency Use Authorization (EUA). This EUA will remain  in effect (meaning this test can be used) for the duration of the COVID-19 declaration under Section 564(b)(1) of the Act, 21 U.S.C.section 360bbb-3(b)(1), unless the authorization is terminated  or revoked sooner.       Influenza A by PCR NEGATIVE NEGATIVE Final   Influenza B by PCR NEGATIVE NEGATIVE Final    Comment: (NOTE) The Xpert Xpress SARS-CoV-2/FLU/RSV plus assay is intended as an aid in the diagnosis of influenza from Nasopharyngeal swab specimens and should not be used as a sole basis for treatment. Nasal washings and aspirates are unacceptable for Xpert Xpress SARS-CoV-2/FLU/RSV testing.  Fact Sheet for Patients: BloggerCourse.com  Fact Sheet for Healthcare Providers: SeriousBroker.it  This test is not yet approved or cleared by the [place]  FDA and has been authorized for detection and/or diagnosis of SARS-CoV-2 by FDA under an Emergency Use Authorization (EUA). This EUA will remain in effect (meaning this test can be used) for the duration of the COVID-19 declaration under Section 564(b)(1) of the Act, 21 U.S.C. section 360bbb-3(b)(1), unless the authorization is terminated or revoked.     Resp Syncytial Virus by PCR NEGATIVE NEGATIVE Final    Comment: (NOTE) Fact Sheet for Patients: BloggerCourse.com  Fact Sheet for Healthcare Providers: SeriousBroker.it  This test is not yet approved or cleared by the [place]  FDA and has been authorized for detection and/or diagnosis of SARS-CoV-2 by FDA under an Emergency Use Authorization (EUA). This EUA will remain in  effect (meaning this test can be used) for the duration of the COVID-19 declaration under Section 564(b)(1) of the Act, 21 U.S.C. section 360bbb-3(b)(1), unless the authorization is terminated  or revoked.  Performed at [facility], [address] [place], [place] [address]   Culture, blood (Routine X 2) w Reflex to ID Panel     Status: None   Collection Time: 10/16/23 10:16 AM   Specimen: BLOOD  Result Value Ref Range Status   Specimen Description BLOOD BLOOD LEFT ARM  Final   Special Requests   Final    BOTTLES DRAWN AEROBIC AND ANAEROBIC Blood Culture adequate volume   Culture   Final    NO GROWTH 5 DAYS Performed at [facility], [address] [place], [place] [address]    Report Status 10/21/2023 FINAL  Final  Culture, blood (Routine X 2) w Reflex to ID Panel     Status: None   Collection Time: 10/16/23 10:16 AM   Specimen: BLOOD  Result Value Ref Range Status   Specimen Description BLOOD BLOOD LEFT HAND  Final   Special Requests   Final    BOTTLES DRAWN AEROBIC ONLY Blood Culture results may not be optimal due to an inadequate volume of blood received in culture bottles   Culture   Final    NO GROWTH 5 DAYS Performed at [facility], [address] [place], [place] [address]    Report Status 10/21/2023 FINAL  Final  Body fluid culture w Gram Stain     Status: None   Collection Time: 10/17/23  4:20 PM   Specimen: PATH Cytology Pleural fluid  Result Value Ref Range Status   Specimen Description   Final    FLUID Performed at [facility], [address] [place], [place] [address]    Special Requests   Final    CYTO PLEU Performed at [facility], [address] [place], [place] [address]    Gram Stain   Final    FEW WBC PRESENT, PREDOMINANTLY MONONUCLEAR NO ORGANISMS SEEN    Culture   Final    NO GROWTH 3 DAYS Performed at [facility], [address], [place], [place] [address]    Report Status 10/21/2023 FINAL  Final  Urine Culture (for pregnant, neutropenic or urologic patients or patients with an indwelling urinary catheter)     Status: Abnormal   Collection Time: 10/19/23  6:19 PM    Specimen: Urine, Clean Catch  Result Value Ref Range Status   Specimen Description   Final    URINE, CLEAN CATCH Performed at [facility], [address] [place], [place] [address]    Special Requests   Final    NONE Performed at [facility], [address], [place], [place] [address]    Culture >=100,000 COLONIES/mL YEAST (A)  Final   Report Status 10/21/2023 FINAL  Final         Radiology Studies: [facility] Chest Port 1 View Result Date: 10/21/2023 CLINICAL DATA:  Pleural effusion. Status post left thoracentesis on 10/17/2023 EXAM: PORTABLE CHEST 1 VIEW COMPARISON:  Chest x-ray following thoracentesis 10/17/2023 FINDINGS: Stable mild cardiac enlargement. Left lower lobe atelectasis/consolidation. Potential small left pleural effusion. No pneumothorax. No pulmonary edema. No right-sided pleural fluid. IMPRESSION: Left lower lobe atelectasis/consolidation and potential small left pleural effusion. No pneumothorax. Electronically Signed   By: Erica Hau M.D.   On: 10/21/2023 11:27   MR BRAIN WO CONTRAST Result Date: 10/19/2023  CLINICAL DATA:  Follow-up examination for stroke. EXAM: MRI HEAD WITHOUT CONTRAST TECHNIQUE: Multiplanar, multiecho pulse sequences of the brain and surrounding structures were obtained without intravenous contrast. COMPARISON:  Prior MRI from 10/15/2023 FINDINGS: Brain: Age-related cerebral atrophy with mild chronic microvascular ischemic disease. There has been normal expected interval evolution of previously identified right basal ganglia infarct, relatively stable in size and distribution from prior. No evidence for hemorrhagic transformation or other complication. No significant regional mass effect. No other areas of new or interval infarction. No acute or chronic intracranial hemorrhage. Small meningioma at the right posterior fossa without significant mass effect or edema, stable. No other mass lesion or mass effect. No midline shift. No  hydrocephalus. No extra-axial fluid collection. Pituitary gland within normal limits. Vascular: Major intracranial vascular flow voids are maintained. Skull and upper cervical spine: Craniocervical junction within normal limits. Bone marrow signal intensity normal. No scalp soft tissue abnormality. Sinuses/Orbits: Prior bilateral ocular lens replacement. Mild left frontoethmoidal sinus disease. Trace bilateral mastoid effusions, of doubtful significance. Other: None. IMPRESSION: 1. Normal expected interval evolution of previously identified right basal ganglia infarct. No evidence for hemorrhagic transformation or other complication. 2. No other new acute intracranial abnormality. 3. Age-related cerebral atrophy with mild chronic microvascular ischemic disease. 4. Small right posterior fossa meningioma without significant mass effect or edema, stable. Electronically Signed   By: Virgia Griffins M.D.   On: 10/19/2023 18:10        Scheduled Meds:   stroke: early stages of recovery book   Does not apply Once   aspirin  EC  81 mg Oral Q0600   atorvastatin   40 mg Oral Daily   cycloSPORINE   1 drop Both Eyes BID   enoxaparin  (LOVENOX ) injection  40 mg Subcutaneous Q24H   famotidine   20 mg Oral BID   feeding supplement  237 mL Oral TID BM   furosemide   40 mg Oral Daily   midodrine   5 mg Oral TID WC   pantoprazole  40 mg Oral Daily   potassium chloride   20 mEq Oral Daily   prednisoLONE  acetate  2 drop Left Eye BID   sodium chloride  flush  3 mL Intravenous Q12H   timolol   1 drop Both Eyes Daily   umeclidinium-vilanterol  1 puff Inhalation Daily   valACYclovir   500 mg Oral QHS   Continuous Infusions:     LOS: 7 days    Total time spent: 40 minutes   Althia Atlas, MD [facility] Pager [phone]  If 7PM-7AM, please contact night-coverage [email or web address] 10/21/2023, 3:40 PM

## 2023-10-22 DIAGNOSIS — K7460 Unspecified cirrhosis of liver: Secondary | ICD-10-CM | POA: Diagnosis not present

## 2023-10-22 DIAGNOSIS — J449 Chronic obstructive pulmonary disease, unspecified: Secondary | ICD-10-CM | POA: Diagnosis not present

## 2023-10-22 DIAGNOSIS — K760 Fatty (change of) liver, not elsewhere classified: Secondary | ICD-10-CM | POA: Diagnosis not present

## 2023-10-22 DIAGNOSIS — M6259 Muscle wasting and atrophy, not elsewhere classified, multiple sites: Secondary | ICD-10-CM | POA: Diagnosis not present

## 2023-10-22 DIAGNOSIS — R531 Weakness: Secondary | ICD-10-CM | POA: Diagnosis not present

## 2023-10-22 DIAGNOSIS — I6930 Unspecified sequelae of cerebral infarction: Secondary | ICD-10-CM | POA: Diagnosis not present

## 2023-10-22 DIAGNOSIS — Z7982 Long term (current) use of aspirin: Secondary | ICD-10-CM | POA: Diagnosis not present

## 2023-10-22 DIAGNOSIS — M6281 Muscle weakness (generalized): Secondary | ICD-10-CM | POA: Diagnosis not present

## 2023-10-22 DIAGNOSIS — G4733 Obstructive sleep apnea (adult) (pediatric): Secondary | ICD-10-CM | POA: Diagnosis not present

## 2023-10-22 DIAGNOSIS — R41841 Cognitive communication deficit: Secondary | ICD-10-CM | POA: Diagnosis not present

## 2023-10-22 DIAGNOSIS — I639 Cerebral infarction, unspecified: Secondary | ICD-10-CM | POA: Diagnosis not present

## 2023-10-22 DIAGNOSIS — I69354 Hemiplegia and hemiparesis following cerebral infarction affecting left non-dominant side: Secondary | ICD-10-CM | POA: Diagnosis not present

## 2023-10-22 DIAGNOSIS — I10 Essential (primary) hypertension: Secondary | ICD-10-CM | POA: Diagnosis not present

## 2023-10-22 DIAGNOSIS — I619 Nontraumatic intracerebral hemorrhage, unspecified: Secondary | ICD-10-CM | POA: Diagnosis not present

## 2023-10-22 DIAGNOSIS — N390 Urinary tract infection, site not specified: Secondary | ICD-10-CM | POA: Diagnosis not present

## 2023-10-22 DIAGNOSIS — J189 Pneumonia, unspecified organism: Secondary | ICD-10-CM | POA: Diagnosis not present

## 2023-10-22 DIAGNOSIS — R2681 Unsteadiness on feet: Secondary | ICD-10-CM | POA: Diagnosis not present

## 2023-10-22 DIAGNOSIS — N179 Acute kidney failure, unspecified: Secondary | ICD-10-CM | POA: Diagnosis not present

## 2023-10-22 DIAGNOSIS — R6521 Severe sepsis with septic shock: Secondary | ICD-10-CM | POA: Diagnosis not present

## 2023-10-22 DIAGNOSIS — J90 Pleural effusion, not elsewhere classified: Secondary | ICD-10-CM | POA: Diagnosis not present

## 2023-10-22 DIAGNOSIS — D329 Benign neoplasm of meninges, unspecified: Secondary | ICD-10-CM | POA: Diagnosis not present

## 2023-10-22 DIAGNOSIS — Z5189 Encounter for other specified aftercare: Secondary | ICD-10-CM | POA: Diagnosis not present

## 2023-10-22 DIAGNOSIS — E039 Hypothyroidism, unspecified: Secondary | ICD-10-CM | POA: Diagnosis not present

## 2023-10-22 DIAGNOSIS — A419 Sepsis, unspecified organism: Secondary | ICD-10-CM | POA: Diagnosis not present

## 2023-10-22 DIAGNOSIS — E1169 Type 2 diabetes mellitus with other specified complication: Secondary | ICD-10-CM | POA: Diagnosis not present

## 2023-10-22 DIAGNOSIS — E1159 Type 2 diabetes mellitus with other circulatory complications: Secondary | ICD-10-CM | POA: Diagnosis not present

## 2023-10-22 DIAGNOSIS — E0500 Thyrotoxicosis with diffuse goiter without thyrotoxic crisis or storm: Secondary | ICD-10-CM | POA: Diagnosis not present

## 2023-10-22 DIAGNOSIS — J9601 Acute respiratory failure with hypoxia: Secondary | ICD-10-CM | POA: Diagnosis not present

## 2023-10-22 DIAGNOSIS — E785 Hyperlipidemia, unspecified: Secondary | ICD-10-CM | POA: Diagnosis not present

## 2023-10-22 LAB — HEPATIC FUNCTION PANEL
ALT: 44 U/L (ref 0–44)
AST: 78 U/L — ABNORMAL HIGH (ref 15–41)
Albumin: 2.9 g/dL — ABNORMAL LOW (ref 3.5–5.0)
Alkaline Phosphatase: 361 U/L — ABNORMAL HIGH (ref 38–126)
Bilirubin, Direct: 0.5 mg/dL — ABNORMAL HIGH (ref 0.0–0.2)
Indirect Bilirubin: 0.5 mg/dL (ref 0.3–0.9)
Total Bilirubin: 1 mg/dL (ref 0.0–1.2)
Total Protein: 6.7 g/dL (ref 6.5–8.1)

## 2023-10-22 LAB — BASIC METABOLIC PANEL WITH GFR
Anion gap: 6 (ref 5–15)
BUN: 29 mg/dL — ABNORMAL HIGH (ref 8–23)
CO2: 28 mmol/L (ref 22–32)
Calcium: 8.9 mg/dL (ref 8.9–10.3)
Chloride: 99 mmol/L (ref 98–111)
Creatinine, Ser: 0.85 mg/dL (ref 0.44–1.00)
GFR, Estimated: >60 mL/min (ref >60)
Glucose, Bld: 116 mg/dL — ABNORMAL HIGH (ref 70–99)
Potassium: 4 mmol/L (ref 3.5–5.1)
Sodium: 133 mmol/L — ABNORMAL LOW (ref 135–145)

## 2023-10-22 LAB — CBC
HCT: 42.5 % (ref 36.0–46.0)
Hemoglobin: 14.3 g/dL (ref 12.0–15.0)
MCH: 31.5 pg (ref 26.0–34.0)
MCHC: 33.6 g/dL (ref 30.0–36.0)
MCV: 93.6 fL (ref 80.0–100.0)
Platelets: 218 10*3/uL (ref 150–400)
RBC: 4.54 MIL/uL (ref 3.87–5.11)
RDW: 13.9 % (ref 11.5–15.5)
WBC: 9.1 10*3/uL (ref 4.0–10.5)
nRBC: 0 % (ref 0.0–0.2)

## 2023-10-22 MED ORDER — POTASSIUM CHLORIDE CRYS ER 20 MEQ PO TBCR: 20.0000 meq | EXTENDED_RELEASE_TABLET | Freq: Every day | ORAL | Status: DC

## 2023-10-22 MED ORDER — FLUCONAZOLE 200 MG PO TABS: 200.0000 mg | ORAL_TABLET | Freq: Every day | ORAL | Status: AC

## 2023-10-22 MED ORDER — FAMOTIDINE 20 MG PO TABS: 20.0000 mg | ORAL_TABLET | Freq: Every day | ORAL | Status: AC

## 2023-10-22 MED ORDER — ATORVASTATIN CALCIUM 40 MG PO TABS: 40.0000 mg | ORAL_TABLET | Freq: Every day | ORAL | Status: DC

## 2023-10-22 MED ORDER — ATENOLOL 25 MG PO TABS: 25.0000 mg | ORAL_TABLET | Freq: Two times a day (BID) | ORAL | 1 refills | Status: DC

## 2023-10-22 MED ORDER — FLUCONAZOLE 100 MG PO TABS
200.0000 mg | ORAL_TABLET | Freq: Every day | ORAL | Status: DC
  Administered 2023-10-22: 200 mg via ORAL
  Filled 2023-10-22: qty 2

## 2023-10-22 MED ORDER — TORSEMIDE 40 MG PO TABS: 40.0000 mg | ORAL_TABLET | Freq: Every day | ORAL | Status: DC

## 2023-10-22 NOTE — Progress Notes (Addendum)
 Occupational Therapy Treatment Patient Details Name: Lauren Morris MRN: [id number] DOB: 1932-03-06 Today's Date: 10/22/2023   History of present illness Pt is an 89 year old presenting to the ED with AMS, admitted with septic shock, cholecystitis, UTI, and with MRI of the brain obtained which demonstrated a small to moderate-sized subcortical stroke on the right. PMH significant for Klebsiella UTI, type 2 diabetes, Graves' disease now with acquired hypothyroidism, hypertension, hyperlipidemia, OSA on CPAP, recurrent bronchitis, meningioma   OT comments  Chart reviewed to date, pt greeted semi supine in bed, agreeable to OT tx session targeting improving functional activity tolerance in the setting of ADL tasks. Slightly increased assist for bed mobility on this date requiring MOD-MAX A, STS with MIN A, short amb transfers to bsc and then to chair with RW with MIN A. Pt on 2 L via [place] at start of session, removed and on RA during mobility with spo2 to 85% on RA briefly, but monitored throughout and 90-91% throughout. Pt with increased work of breathing however so 2L donned at end of session per her request, NT in room and nurse notified via secure chat. Pt is making progress towards goals, discharge recommendation remains appropriate.       If plan is discharge home, recommend the following:  A little help with walking and/or transfers;A lot of help with bathing/dressing/bathroom;Assistance with cooking/housework;Help with stairs or ramp for entrance   Equipment Recommendations  Other (comment) (defer to next venue of care)    Recommendations for Other Services      Precautions / Restrictions Precautions Precautions: Fall Recall of Precautions/Restrictions: Intact       Mobility Bed Mobility Overal bed mobility: Needs Assistance Bed Mobility: Supine to Sit     Supine to sit: Mod assist, Used rails, HOB elevated          Transfers Overall transfer level: Needs assistance Equipment  used: Rolling walker (2 wheels) Transfers: Sit to/from Stand Sit to Stand: Min assist                 Balance Overall balance assessment: Needs assistance Sitting-balance support: Feet supported, Single extremity supported Sitting balance-Leahy Scale: Fair     Standing balance support: Bilateral upper extremity supported, During functional activity, Reliant on assistive device for balance Standing balance-Leahy Scale: Fair                             ADL either performed or assessed with clinical judgement   ADL Overall ADL's : Needs assistance/impaired     Grooming: Set up;Sitting       Lower Body Bathing: Maximal assistance;Sit to/from stand           Toilet Transfer: Minimal assistance;BSC/3in1;Rolling walker (2 wheels) Toilet Transfer Details (indicate cue type and reason): short amb transfer         Functional mobility during ADLs: Minimal assistance;Rolling walker (2 wheels) (approx 6' in room with RW)      Extremity/Trunk Assessment              Vision       Perception     Praxis     Communication Communication Communication: No apparent difficulties   Cognition Arousal: Alert Behavior During Therapy: WFL for tasks assessed/performed Cognition: Cognition impaired           Executive functioning impairment (select all impairments): Problem solving  Following commands: Impaired Following commands impaired: Follows one step commands with increased time      Cueing   Cueing Techniques: Verbal cues, Visual cues  Exercises Other Exercises Other Exercises: edu pt re: role of OT, role of rehab    Shoulder Instructions       General Comments pt on RA during mobility, one brief reading at 85% on RA after mobility, 90-91% on RA after seated rest break;    Pertinent Vitals/ Pain       Pain Assessment Pain Assessment: No/denies pain  Home Living                                           Prior Functioning/Environment              Frequency  Min 2X/week        Progress Toward Goals  OT Goals(current goals can now be found in the care plan section)  Progress towards OT goals: Progressing toward goals  Acute Rehab OT Goals Time For Goal Achievement: 10/30/23  Plan      Co-evaluation                 AM-PAC OT "6 Clicks" Daily Activity     Outcome Measure   Help from another person eating meals?: None Help from another person taking care of personal grooming?: None Help from another person toileting, which includes using toliet, bedpan, or urinal?: A Lot Help from another person bathing (including washing, rinsing, drying)?: A Lot Help from another person to put on and taking off regular upper body clothing?: A Little Help from another person to put on and taking off regular lower body clothing?: A Lot 6 Click Score: 17    End of Session Equipment Utilized During Treatment: Gait belt;Rolling walker (2 wheels);Oxygen  (oxygen  on pre/post session)  OT Visit Diagnosis: Other abnormalities of gait and mobility (R26.89);Unsteadiness on feet (R26.81)   Activity Tolerance Patient tolerated treatment well   Patient Left in chair;with call bell/phone within reach;with chair alarm set;with nursing/sitter in room   Nurse Communication Mobility status        Time: 1610-9604 OT Time Calculation (min): 21 min  Charges: OT General Charges $OT Visit: 1 Visit OT Treatments $Self Care/Home Management : 8-22 mins  ,Gerre Kraft, OTD OTR/L  10/22/23, 12:52 PM

## 2023-10-22 NOTE — Plan of Care (Signed)
  Problem: Clinical Measurements: Goal: Diagnostic test results will improve 10/22/2023 0710 by Janell Media, RN Outcome: Progressing 10/22/2023 0130 by Janell Media, RN Outcome: Progressing Goal: Signs and symptoms of infection will decrease 10/22/2023 0710 by Janell Media, RN Outcome: Progressing 10/22/2023 0130 by Janell Media, RN Outcome: Progressing   Problem: Respiratory: Goal: Ability to maintain adequate ventilation will improve 10/22/2023 0710 by Janell Media, RN Outcome: Progressing 10/22/2023 0130 by Janell Media, RN Outcome: Progressing   Problem: Ischemic Stroke/TIA Tissue Perfusion: Goal: Complications of ischemic stroke/TIA will be minimized 10/22/2023 0710 by Janell Media, RN Outcome: Progressing 10/22/2023 0130 by Janell Media, RN Outcome: Progressing   Problem: Coping: Goal: Will verbalize positive feelings about self 10/22/2023 0710 by Janell Media, RN Outcome: Progressing 10/22/2023 0130 by Janell Media, RN Outcome: Progressing Goal: Will identify appropriate support needs 10/22/2023 0710 by Janell Media, RN Outcome: Progressing 10/22/2023 0130 by Janell Media, RN Outcome: Progressing   Problem: Elimination: Goal: Will not experience complications related to bowel motility 10/22/2023 0710 by Janell Media, RN Outcome: Progressing 10/22/2023 0130 by Janell Media, RN Outcome: Progressing Goal: Will not experience complications related to urinary retention 10/22/2023 0710 by Janell Media, RN Outcome: Progressing 10/22/2023 0130 by Janell Media, RN Outcome: Progressing   Problem: Pain Managment: Goal: General experience of comfort will improve and/or be controlled 10/22/2023 0710 by Janell Media, RN Outcome: Progressing 10/22/2023 0130 by Janell Media, RN Outcome: Progressing   Problem: Safety: Goal: Ability to remain free from injury will improve 10/22/2023 0710 by Janell Media, RN Outcome:  Progressing 10/22/2023 0130 by Janell Media, RN Outcome: Progressing

## 2023-10-22 NOTE — Plan of Care (Signed)
  Problem: Clinical Measurements: Goal: Diagnostic test results will improve Outcome: Progressing Goal: Signs and symptoms of infection will decrease Outcome: Progressing   Problem: Respiratory: Goal: Ability to maintain adequate ventilation will improve Outcome: Progressing   Problem: Coping: Goal: Will verbalize positive feelings about self Outcome: Progressing Goal: Will identify appropriate support needs Outcome: Progressing   Problem: Clinical Measurements: Goal: Ability to maintain clinical measurements within normal limits will improve Outcome: Progressing Goal: Will remain free from infection Outcome: Progressing Goal: Diagnostic test results will improve Outcome: Progressing Goal: Respiratory complications will improve Outcome: Progressing Goal: Cardiovascular complication will be avoided Outcome: Progressing   Problem: Pain Managment: Goal: General experience of comfort will improve and/or be controlled Outcome: Progressing   Problem: Safety: Goal: Ability to remain free from injury will improve Outcome: Progressing

## 2023-10-22 NOTE — TOC Transition Note (Signed)
 Transition of Care [facility]) - Discharge Note   Patient Details  Name: Lauren Morris MRN: [id number] Date of Birth: 1932-02-17  [facility] CM/SW Contact:  Odilia Bennett, LCSW Phone Number: 10/22/2023, 2:53 PM   Clinical Narrative:  Patient has orders to discharge home today. RN will call report to [phone] (Room 104). Daughter will be here around 3:30 to transport. No further concerns. CSW signing off.   Final next level of care: Skilled Nursing Facility Barriers to Discharge: Barriers Resolved   Patient Goals and CMS Choice     Choice offered to / list presented to : Patient, Adult Children      Discharge Placement   Existing PASRR number confirmed : 10/17/23          Patient chooses bed at: [facility] Patient to be transferred to facility by: Daughter Name of family member notified: Starlene Eaton Patient and family notified of of transfer: 10/22/23  Discharge Plan and Services Additional resources added to the After Visit Summary for       Post Acute Care Choice: Skilled Nursing Facility                               Social Drivers of Health (SDOH) Interventions SDOH Screenings   Food Insecurity: No Food Insecurity (10/15/2023)  Housing: Low Risk  (10/15/2023)  Transportation Needs: No Transportation Needs (10/15/2023)  Utilities: Not At Risk (10/15/2023)  Depression (PHQ2-9): Low Risk  (09/10/2023)  Social Connections: Moderately Integrated (10/15/2023)  Tobacco Use: Low Risk  (10/15/2023)     Readmission Risk Interventions     No data to display

## 2023-10-22 NOTE — Discharge Summary (Signed)
 [facility] Discharge Summary   Patient: Lauren Morris JXB:[id number]  PCP: Thersia Flax, MD  Date of admission: 10/14/2023   Date of discharge:  10/22/2023     Discharge Diagnoses:  Principal Problem:   Septic shock (HCC) Active Problems:   UTI (urinary tract infection)   AKI (acute kidney injury) (HCC)   Essential hypertension   Acquired hypothyroidism   Type 2 diabetes mellitus (HCC)   Fatty liver disease, nonalcoholic   COPD (chronic obstructive pulmonary disease) (HCC)   OSA (obstructive sleep apnea)   Calculus of gallbladder without cholecystitis without obstruction   Admitted From: Home Disposition:  SNF   Recommendations for Outpatient Follow-up:  F/u with an MD at the facility in 1 to 2 days, repeat BMP in 1 week, repeat LFTs in 1 week, chest x-ray in 4 weeks F/u Neurology in 1-2 weeks Monitor BP and titrate medications accordingly Follow up LABS/TEST:  As above   Contact information for follow-up providers     [facility] Follow up.   Specialty: General Surgery Why: As needed Contact information: [address] [place]  [address] [phone]             Contact information for after-discharge care     Destination     [facility] Preferred SNF .   Service: Skilled Nursing Contact information: [address] [place]  [phone]                    Diet recommendation: Cardiac diet  Activity: The patient is advised to gradually reintroduce usual activities, as tolerated  Discharge Condition: stable  Code Status: DNR/DNI-Limited  History of present illness: As per the H and P dictated on admission Hospital Course:  HPI was taken from Dr. Guss Legacy: Lauren Morris is a 89 y.o. female with medical history significant of recurrent Klebsiella UTI, type 2 diabetes, Graves' disease now with acquired  hypothyroidism, hypertension, hyperlipidemia, OSA on CPAP, recurrent bronchitis, meningioma, who presents to the ED due to altered mental status.   History obtained from both Lauren Morris and her daughter at bedside.  Lauren Morris states that over the last 1 day, she has noticed increasing difficulty with walking, and this usually happens when she has an underlying UTI.  Her daughter at bedside states that yesterday, Lauren Morris slept all day, which is unusual for her.  Then she checked on her today at 8 AM, and she seemed close to her baseline.  When she checked on her again at approximately 5 or 6 PM, Lauren Morris was laying in her recliner, and still in her pajamas.  She has not gotten up or eaten all day.  She states that this is markedly unusual as Lauren Morris usually sticks to a schedule.  She seemed confused at the time but was still able to answer questions appropriately.  Lauren Morris denies any nausea, vomiting, chest pain, shortness of breath, palpitations, lower extremity swelling, cough, rhinorrhea, sinus congestion.  She denies any urinary symptoms at this time other than frequency, which she states is chronic.   ED course: On arrival to the ED, patient was hypotensive at 84/66 with heart rate of 87.  She was saturating at 92% on room air.  She was afebrile at 99.7.  She was tachypneic at 24/min.  Initial workup notable for WBC of 11.8, potassium 3.2, bicarb 21, creatinine 1.35, BUN 24, alkaline phosphatase 328, AST 97,  ALT 69, total bilirubin 1.9, GFR 38.  Lactic acid 3.2.  Influenza, RSV and COVID-19 negative.  Chest x-ray and CT head obtained with results pending.  Patient given 2 L of IV fluids, Zosyn  and vancomycin .  [facility] contacted for admission.     Assessment & Plan:   # Septic shock: met criteria w/ AMS, hypotension, tachypnea, leukocytosis, elevated lactic acid & questionable acute cholecystitis vs unlikely UTI.  S/p IV rocephin  and IV flagyl .  Transition to oral antibiotics Augmentin  BID, patient  received 2 doses and developed redness on the face, suspected due to Augmentin  so discontinued. No source of infection identified.  Monitor off antibiotics for now   # Acute CVA: MRI showed right basal ganglia infarct, no hemorrhage or mass effect. TTE LVEF 60 to 65%, no WMA, grade 2 diastolic dysfunction, severe PAH, moderate left pleural effusion, no significant valvular abnormality. Continue w/ neuro checks. Neuro consulted, recommended aspirin  and statin, no need of DAPT, patient may need surgical intervention. 4/26 worsening of left-sided weakness, repeat MRI brain showed normal expected interval evolution of previously identified right middle ganglia infarct. No evidence for hemorrhagic transformation or other complication. No other new acute intracranial abnormality. D/w neurology, recommended no intervention, continue same management.     # Cholecystitis ruled out.  As per US  but no abd pain, nausea or vomiting. Likely a poor surgical candidate & pt does not want a percutaneous drain currently.  Continue on IV rocephin  and start IV flagyl .  Blood culture 1/2 growing staph species, d/w ID, rec repeat blood cultures Gen surg following and recs MRI of the liver, which is negative for cholecystitis.  Showed ascites and bilateral pleural effusions. 4/25 DC'd Augmentin  due to facial rash, redness on the face, no any other symptoms vitals stable.  Solu-Medrol  40 mg IV and Benadryl  12.5 mg IV one-time dose given. 4/28 still alk phos is elevated, unknown cause, repeat LFTs after 1 week   # Acute hypoxic respiratory failure, with pulmonary edema and bilateral pleural effusion, could be due to pneumonia CXR: Bilateral reticular interstitial infiltrates with a small bilateral pleural effusions left more than right. Left lower lobe atelectasis. Findings could correlate with congestive changes, pneumonia not excluded. Negative COVID, RSV and flu, s/p antibiotics Lactic acid 2.5--1.3 trended down 4/23  Lasix  80 mg x 1 dose given, DuoNeb as needed 4/24 bilateral pleural effusion, IR consulted for thoracentesis, s/p left thoracentesis 650 mL fluid was tapped.  Partially exudative due to high LDH and neutrophils fluid culture no growth  4/24 --4/26 Lasix  40 mg IV twice daily  4/25 IR repeated ultrasound on right side but there is not enough fluid to do thoracentesis.  Transition to Lasix  40 mg p.o. daily from 4/27 4/28 CXR: Mild atelectasis and mild left pleural effusion.  Limited x-ray by myself, pulmonary edema seems improving 4/28 DC'd Lasix  and started torsemide 40 mg p.o. daily on discharge.  Repeat chest x-ray after 4 weeks, check BMP after 1 week   # Hypokalemia, potassium repleted.  Resolved # Hypophosphatemia, Phos repleted.  Resolved # Unlikely UTI: UA is positive for rare bacteria only.  4/26 c/o dysuria, repeat UA negative.  Urine culture growing yeast 4/28 started Diflucan 200 mg p.o. daily for 2 weeks    # AKI: likely secondary to sepsis, Cr 1.35---0.85 resolved # Metabolic acidosis, resolved s/p bicarb 3 times daily for 2 days # OSA: CPAP qhs  # Liver cirrhosis with ascites, nonalcoholic. Hx of fatty liver disease. US  shows cirrhosis & likely  acute cholecystitis.   MRI positive for cirrhosis, ascites and bilateral effusion S/p Lasix  40 mg p.o. daily, transition to torsemide 40 mg p.o. daily on discharge # Transaminitis: likely secondary to NAFLD & cirrhosis.  Transaminitis resolved, alk phos is still elevated, recommend to repeat LFTs after 1 week.   # DM2: well controlled, HbA1c 5.9. No need for SSI currently  # Hypothyroidism: continue home dose Levothyroxine   # HTN: holding all home anti-HTN meds secondary to hypotension  Patient was on telmisartan  40 mg p.o. daily and atenolol  25 mg at twice daily at home 4/28 BP improved, resumed home meds on discharge. Monitor BP and titrate medications accordingly   Body mass index is 30.71 kg/m.  Nutrition  Interventions:  Patient was seen by physical therapy, who recommended Therapy, SNF placement, which was arranged. On the day of the discharge the patient's vitals were stable, and no other acute medical condition were reported by patient. the patient was felt safe to be discharge at SNF with Therapy.  Consultants: General Surgery, Neurology and IR Procedures: Thoracentesis  Discharge Exam: General: Appear in no distress, no Rash; Oral Mucosa Clear, moist. Cardiovascular: S1 and S2 Present, no Murmur, Respiratory: Acute air entry bilaterally, mild crackles bilaterally, no wheezes  Abdomen: Bowel Sound present, Soft and no tenderness, no hernia Extremities: no Pedal edema, no calf tenderness Neurology: CN grossly intact, left-sided residual weakness power 4/5 affect appropriate.  Filed Weights   10/14/23 1940  Weight: 76.2 kg   Vitals:   10/22/23 0428 10/22/23 0732  BP: (!) 133/51 (!) 152/59  Pulse: 88 96  Resp: 20 20  Temp: 98 F (36.7 C) 98 F (36.7 C)  SpO2: 90% 100%    DISCHARGE MEDICATION: Allergies as of 10/22/2023   No Known Allergies      Medication List     STOP taking these medications    amoxicillin -clavulanate 875-125 MG tablet Commonly known as: AUGMENTIN    dorzolamide -timolol  2-0.5 % ophthalmic solution Commonly known as: COSOPT    melatonin 5 MG Tabs       TAKE these medications    albuterol  (2.5 MG/3ML) 0.083% nebulizer solution Commonly known as: PROVENTIL  Take 3 mLs (2.5 mg total) by nebulization every 6 (six) hours as needed for wheezing or shortness of breath.   Anoro Ellipta  62.5-25 MCG/ACT Aepb Generic drug: umeclidinium-vilanterol Inhale 1 puff into the lungs daily.   Aspirin  81 MG Caps Take 1 capsule by mouth daily at 6 (six) AM.   atenolol  25 MG tablet Commonly known as: TENORMIN  Take 1 tablet (25 mg total) by mouth 2 (two) times daily.   atorvastatin  40 MG tablet Commonly known as: LIPITOR Take 1 tablet (40 mg total) by  mouth daily.   CALCIUM  1000 + D PO Take 1 tablet by mouth daily.   Difluprednate 0.05 % Emul Place 1 drop into the left eye 2 (two) times daily.   famotidine  20 MG tablet Commonly known as: PEPCID  Take 1 tablet (20 mg total) by mouth at bedtime. What changed: when to take this   fish oil-omega-3 fatty acids  1000 MG capsule Take 2 capsules (2 g total) by mouth daily.   fluconazole 200 MG tablet Commonly known as: DIFLUCAN Take 1 tablet (200 mg total) by mouth daily for 14 days. Start taking on: October 23, 2023   guaiFENesin  600 MG 12 hr tablet Commonly known as: MUCINEX  Take 1 tablet (600 mg total) by mouth 2 (two) times daily.   multivitamin with minerals Tabs tablet Take 1 tablet  by mouth daily.   potassium chloride  SA 20 MEQ tablet Commonly known as: KLOR-CON  M Take 1 tablet (20 mEq total) by mouth daily. Start taking on: October 23, 2023   prednisoLONE  Acetate P-F 1 % ophthalmic suspension Generic drug: prednisoLONE  acetate Place 2 drops into the left eye 2 (two) times daily.   Restasis  0.05 % ophthalmic emulsion Generic drug: cycloSPORINE  Place 1 drop into both eyes 2 (two) times daily.   telmisartan  40 MG tablet Commonly known as: MICARDIS  Take 1 tablet (40 mg total) by mouth daily. Hold if SBP less than 130 mmHg What changed:  additional instructions Another medication with the same name was removed. Continue taking this medication, and follow the directions you see here.   timolol  0.5 % ophthalmic solution Commonly known as: TIMOPTIC  SMARTSIG:In Eye(s)   Torsemide 40 MG Tabs Take 40 mg by mouth daily at 12 noon. Start taking on: October 23, 2023   valACYclovir  500 MG tablet Commonly known as: VALTREX  Take 500 mg by mouth at bedtime.   vitamin C  1000 MG tablet Take 1 tablet (1,000 mg total) by mouth daily.   Zinc  Oxide 12.8 % ointment Commonly known as: TRIPLE PASTE Apply 1 Application topically. Every shift   zinc  sulfate (50mg  elemental zinc ) 220  (50 Zn) MG capsule Take 1 capsule (220 mg total) by mouth daily.       No Known Allergies Discharge Instructions     Call MD for:  difficulty breathing, headache or visual disturbances   Complete by: As directed    Call MD for:  extreme fatigue   Complete by: As directed    Call MD for:  persistant dizziness or light-headedness   Complete by: As directed    Call MD for:  persistant nausea and vomiting   Complete by: As directed    Call MD for:  severe uncontrolled pain   Complete by: As directed    Call MD for:  temperature >100.4   Complete by: As directed    Diet - low sodium heart healthy   Complete by: As directed    Discharge instructions   Complete by: As directed    F/u with an MD at the facility in 1 to 2 days, repeat BMP in 1 week, repeat LFTs in 1 week, chest x-ray in 4 weeks F/u Neurology in 1-2 weeks Monitor BP and titrate medications accordingly   Increase activity slowly   Complete by: As directed        The results of significant diagnostics from this hospitalization (including imaging, microbiology, ancillary and laboratory) are listed below for reference.    Significant Diagnostic Studies: DG Chest Port 1 View Result Date: 10/21/2023 CLINICAL DATA:  Pleural effusion. Status post left thoracentesis on 10/17/2023 EXAM: PORTABLE CHEST 1 VIEW COMPARISON:  Chest x-ray following thoracentesis 10/17/2023 FINDINGS: Stable mild cardiac enlargement. Left lower lobe atelectasis/consolidation. Potential small left pleural effusion. No pneumothorax. No pulmonary edema. No right-sided pleural fluid. IMPRESSION: Left lower lobe atelectasis/consolidation and potential small left pleural effusion. No pneumothorax. Electronically Signed   By: Erica Hau M.D.   On: 10/21/2023 11:27   MR BRAIN WO CONTRAST Result Date: 10/19/2023 CLINICAL DATA:  Follow-up examination for stroke. EXAM: MRI HEAD WITHOUT CONTRAST TECHNIQUE: Multiplanar, multiecho pulse sequences of the brain  and surrounding structures were obtained without intravenous contrast. COMPARISON:  Prior MRI from 10/15/2023 FINDINGS: Brain: Age-related cerebral atrophy with mild chronic microvascular ischemic disease. There has been normal expected interval evolution of previously identified right  basal ganglia infarct, relatively stable in size and distribution from prior. No evidence for hemorrhagic transformation or other complication. No significant regional mass effect. No other areas of new or interval infarction. No acute or chronic intracranial hemorrhage. Small meningioma at the right posterior fossa without significant mass effect or edema, stable. No other mass lesion or mass effect. No midline shift. No hydrocephalus. No extra-axial fluid collection. Pituitary gland within normal limits. Vascular: Major intracranial vascular flow voids are maintained. Skull and upper cervical spine: Craniocervical junction within normal limits. Bone marrow signal intensity normal. No scalp soft tissue abnormality. Sinuses/Orbits: Prior bilateral ocular lens replacement. Mild left frontoethmoidal sinus disease. Trace bilateral mastoid effusions, of doubtful significance. Other: None. IMPRESSION: 1. Normal expected interval evolution of previously identified right basal ganglia infarct. No evidence for hemorrhagic transformation or other complication. 2. No other new acute intracranial abnormality. 3. Age-related cerebral atrophy with mild chronic microvascular ischemic disease. 4. Small right posterior fossa meningioma without significant mass effect or edema, stable. Electronically Signed   By: Virgia Griffins M.D.   On: 10/19/2023 18:10   US  CHEST (PLEURAL EFFUSION) Result Date: 10/18/2023 CLINICAL DATA:  89 year old female with prior left-sided thoracentesis EXAM: CHEST ULTRASOUND COMPARISON:  None Available. FINDINGS: Ultrasound demonstrates scant right-sided pleural fluid. Thoracentesis deferred at this time. IMPRESSION:  Scant right pleural fluid Electronically Signed   By: Myrlene Asper D.O.   On: 10/18/2023 11:09   DG Chest Port 1 View Result Date: 10/17/2023 CLINICAL DATA:  Status post thoracentesis EXAM: PORTABLE CHEST 1 VIEW COMPARISON:  Chest x-ray 10/15/2023 FINDINGS: Heart is enlarged. There is central pulmonary vascular congestion. There is a small left pleural effusion which has slightly decreased from prior. No pneumothorax visualized. There is atelectasis in the left lung base and right mid lung. No acute fractures are seen. IMPRESSION: 1. Small left pleural effusion which has slightly decreased from prior. No pneumothorax visualized. 2. Cardiomegaly with central pulmonary vascular congestion. Electronically Signed   By: Tyron Gallon M.D.   On: 10/17/2023 19:22   US  THORACENTESIS ASP PLEURAL SPACE W/IMG GUIDE Result Date: 10/17/2023 INDICATION: Patient with history of cirrhosis, hospitalized for stroke. During hospitalization, team noted bilateral pleural effusions; request for thoracentesis. Has not received thoracentesis before. EXAM: ULTRASOUND GUIDED DIAGNOSTIC AND THERAPEUTIC THORACENTESIS MEDICATIONS: 10 mL 1% lidocaine  COMPLICATIONS: None immediate. PROCEDURE: An ultrasound guided thoracentesis was thoroughly discussed with the patient and questions answered. The benefits, risks, alternatives and complications were also discussed. The patient understands and wishes to proceed with the procedure. Written consent was obtained. Ultrasound was performed to localize and mark an adequate pocket of fluid in the left chest. The area was then prepped and draped in the normal sterile fashion. 1% Lidocaine  was used for local anesthesia. Under ultrasound guidance a 6 Fr Safe-T-Centesis catheter was introduced. Thoracentesis was performed. The catheter was removed and a dressing applied. FINDINGS: A total of approximately 650 milliliters of amber fluid was removed. Samples were sent to the laboratory as requested by the  clinical team. IMPRESSION: Successful ultrasound guided left diagnostic and therapeutic thoracentesis yielding 650 milliliters of pleural fluid. A post-procedural chest x-ray was ordered. Performed by Terressa Fess, NP Electronically Signed   By: Elene Griffes M.D.   On: 10/17/2023 17:59   MR LIVER W WO CONTRAST Result Date: 10/17/2023 CLINICAL DATA:  Cirrhosis EXAM: MRI ABDOMEN WITHOUT AND WITH CONTRAST TECHNIQUE: Multiplanar multisequence MR imaging of the abdomen was performed both before and after the administration of intravenous contrast. CONTRAST:  7mL  GADAVIST  GADOBUTROL  1 MMOL/ML IV SOLN COMPARISON:  Right upper quadrant ultrasound, 10/14/2023, CT abdomen pelvis, 08/02/2021 FINDINGS: Lower chest: Cardiomegaly.  Moderate bilateral pleural effusions. Hepatobiliary: Coarse, nodular cirrhotic morphology of the liver. No solid liver abnormality is seen. Multiple small gallstones. Mildly distended gallbladder with trace pericholecystic fluid, nonspecific in the setting of ascites. No significant gallbladder wall thickening. No biliary ductal dilatation nor evidence of choledocholithiasis. Pancreas: Unremarkable. No pancreatic ductal dilatation or surrounding inflammatory changes. Spleen: Mild splenomegaly, maximum coronal span 13.4 cm. Adrenals/Urinary Tract: Adrenal glands are unremarkable. Kidneys are normal, without renal calculi, solid lesion, or hydronephrosis. Stomach/Bowel: Stomach is within normal limits. No evidence of bowel wall thickening, distention, or inflammatory changes. Vascular/Lymphatic: Severe aortic atherosclerosis. 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 No enlarged abdominal lymph nodes. Other: No abdominal wall hernia.  Anasarca.  Trace ascites. Musculoskeletal: No acute or significant osseous findings. IMPRESSION: 1. Cirrhosis. No focal liver lesions. Mild  splenomegaly and trace ascites. 2. Cholelithiasis. Mildly distended gallbladder with trace pericholecystic fluid, nonspecific in the setting of ascites. No significant gallbladder wall thickening. No biliary ductal dilatation nor evidence of choledocholithiasis. 3. Severe aortic atherosclerosis. 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 4. Moderate bilateral pleural effusions. Aortic Atherosclerosis (ICD10-I70.0). Electronically Signed   By: Fredricka Jenny M.D.   On: 10/17/2023 06:05   ECHOCARDIOGRAM COMPLETE Result Date: 10/16/2023    ECHOCARDIOGRAM REPORT   Patient Name:   Lauren Morris Date of Exam: 10/15/2023 Medical Rec #:  [id number]   Height:       62.0 in Accession #:    [id number]  Weight:       167.9 lb Date of Birth:  1932/03/18  BSA:          1.775 m Patient Age:    89 years    BP:           100/53 mmHg Patient Gender: F           HR:           79 bpm. Exam Location:  [facility] Procedure: 2D Echo, Cardiac Doppler and Color Doppler (Both Spectral and Color            Flow Doppler were utilized during procedure). Indications:     I63.9 Stroke  History:         Patient has prior history of Echocardiogram examinations, most                  recent 08/04/2021. Risk Factors:Hypertension and Sleep Apnea.  Sonographer:     Brigid Canada RDCS Referring Phys:  [id number] [address] P KIRKPATRICK Diagnosing Phys: Belva Boyden MD IMPRESSIONS  1. Left ventricular ejection fraction, by estimation, is 60 to 65%. The left ventricle has normal function. The left ventricle has no regional wall motion abnormalities. Left ventricular diastolic parameters are consistent with Grade II diastolic dysfunction (pseudonormalization).  2. Right ventricular systolic function is normal. The right ventricular size is normal. There is severely elevated pulmonary artery systolic pressure. The estimated right ventricular systolic pressure is 72.2  mmHg.  3. Moderate pleural effusion in the left lateral region.  4. The mitral valve is normal in structure. Mild mitral valve regurgitation. No evidence of mitral stenosis.  5. The aortic valve is normal in structure. There is mild calcification of the  aortic valve. Aortic valve regurgitation is not visualized. Aortic valve sclerosis is present, with no evidence of aortic valve stenosis.  6. The inferior vena cava is normal in size with greater than 50% respiratory variability, suggesting right atrial pressure of 3 mmHg. FINDINGS  Left Ventricle: Left ventricular ejection fraction, by estimation, is 60 to 65%. The left ventricle has normal function. The left ventricle has no regional wall motion abnormalities. Strain was performed and the global longitudinal strain is indeterminate. The left ventricular internal cavity size was normal in size. There is no left ventricular hypertrophy. Left ventricular diastolic parameters are consistent with Grade II diastolic dysfunction (pseudonormalization). Right Ventricle: The right ventricular size is normal. No increase in right ventricular wall thickness. Right ventricular systolic function is normal. There is severely elevated pulmonary artery systolic pressure. The tricuspid regurgitant velocity is 4.10 m/s, and with an assumed right atrial pressure of 5 mmHg, the estimated right ventricular systolic pressure is 72.2 mmHg. Left Atrium: Left atrial size was normal in size. Right Atrium: Right atrial size was normal in size. Pericardium: There is no evidence of pericardial effusion. Mitral Valve: The mitral valve is normal in structure. Mild mitral annular calcification. Mild mitral valve regurgitation. No evidence of mitral valve stenosis. Tricuspid Valve: The tricuspid valve is normal in structure. Tricuspid valve regurgitation is mild . No evidence of tricuspid stenosis. The aortic valve is normal in structure. There is mild calcification of the aortic valve. Aortic  valve regurgitation is not visualized. Aortic valve sclerosis is present, with no evidence of aortic valve stenosis. Pulmonic Valve: The pulmonic valve was normal in structure. Pulmonic valve regurgitation is not visualized. No evidence of pulmonic stenosis. Aorta: The aortic root is normal in size and structure. Venous: The inferior vena cava is normal in size with greater than 50% respiratory variability, suggesting right atrial pressure of 3 mmHg. IAS/Shunts: No atrial level shunt detected by color flow Doppler. Additional Comments: 3D was performed not requiring image post processing on an independent workstation and was indeterminate. There is a moderate pleural effusion in the left lateral region.  LEFT VENTRICLE PLAX 2D LVIDd:         3.60 cm   Diastology LVIDs:         2.00 cm   LV e' medial:    5.50 cm/s LV PW:         1.10 cm   LV E/e' medial:  20.5 LV IVS:        0.80 cm   LV e' lateral:   5.55 cm/s LVOT diam:     1.90 cm   LV E/e' lateral: 20.3 LV SV:         69 LV SV Index:   39 LVOT Area:     2.84 cm  RIGHT VENTRICLE            IVC RV Basal diam:  4.00 cm    IVC diam: 1.20 cm RV S prime:     9.79 cm/s TAPSE (M-mode): 1.8 cm LEFT ATRIUM             Index        RIGHT ATRIUM           Index LA diam:        4.00 cm 2.25 cm/m   RA Area:     12.70 cm LA Vol (A2C):   46.4 ml 26.15 ml/m  RA Volume:   30.40 ml  17.13 ml/m LA Vol (A4C):   47.7  ml 26.88 ml/m LA Biplane Vol: 47.3 ml 26.65 ml/m  AORTIC VALVE AV Area (Vmax):    1.84 cm AV Area (Vmean):   1.77 cm AV Area (VTI):     1.92 cm AV Vmax:           190.67 cm/s AV Vmean:          132.667 cm/s AV VTI:            0.361 m AV Peak Grad:      14.5 mmHg AV Mean Grad:      8.3 mmHg LVOT Vmax:         123.50 cm/s LVOT Vmean:        83.050 cm/s LVOT VTI:          0.244 m LVOT/AV VTI ratio: 0.68  AORTA Ao Root diam: 2.70 cm Ao Asc diam:  3.10 cm MITRAL VALVE                TRICUSPID VALVE MV Area (PHT): 3.50 cm     TR Peak grad:   67.2 mmHg MV Decel Time:  217 msec     TR Vmax:        410.00 cm/s MV E velocity: 112.50 cm/s MV A velocity: 100.50 cm/s  SHUNTS MV E/A ratio:  1.12         Systemic VTI:  0.24 m                             Systemic Diam: 1.90 cm Belva Boyden MD Electronically signed by Belva Boyden MD Signature Date/Time: 10/16/2023/7:15:03 AM    Final    DG Chest Port 1 View Result Date: 10/15/2023 CLINICAL DATA:  Dyspnea EXAM: PORTABLE CHEST 1 VIEW COMPARISON:  October 18, 2023 FINDINGS: Bilateral reticular interstitial infiltrates with a small bilateral pleural effusions left more than right. Left lower lobe atelectasis. Heart and mediastinum normal Findings could correlate with congestive changes, pneumonia not excluded. IMPRESSION: Bilateral reticular interstitial infiltrates with a small bilateral pleural effusions left more than right. Left lower lobe atelectasis. Findings could correlate with congestive changes, pneumonia not excluded. Electronically Signed   By: Fredrich Jefferson M.D.   On: 10/15/2023 14:25   MR BRAIN WO CONTRAST Result Date: 10/15/2023 CLINICAL DATA:  Altered mental status EXAM: MRI HEAD WITHOUT CONTRAST TECHNIQUE: Multiplanar, multiecho pulse sequences of the brain and surrounding structures were obtained without intravenous contrast. COMPARISON:  08/02/2021 FINDINGS: Brain: Right basal ganglia acute infarct. No acute or chronic hemorrhage. There is multifocal hyperintense T2-weighted signal within the white matter. Parenchymal volume and CSF spaces are normal. The midline structures are normal. Unchanged right posterior fossa of meningioma. Vascular: Normal flow voids. Skull and upper cervical spine: Normal calvarium and skull base. Visualized upper cervical spine and soft tissues are normal. Sinuses/Orbits:No paranasal sinus fluid levels or advanced mucosal thickening. No mastoid or middle ear effusion. Normal orbits. IMPRESSION: 1. Right basal ganglia acute infarct. No hemorrhage or mass effect. 2. Unchanged right posterior  fossa meningioma. Electronically Signed   By: Juanetta Nordmann M.D.   On: 10/15/2023 03:30   CT HEAD WO CONTRAST ( ) Addendum Date: 10/14/2023 ADDENDUM REPORT: 10/14/2023 23:41 ADDENDUM: These results were called by telephone at the time of interpretation on 10/14/2023 at 11:41 pm to provider MICHAEL MIAN , who verbally acknowledged these results. Electronically Signed   By: Morgane  Naveau M.D.   On: 10/14/2023 23:41   Result Date: 10/14/2023 CLINICAL DATA:  altered mental status EXAM: CT HEAD WITHOUT CONTRAST TECHNIQUE: Contiguous axial images were obtained from the base of the skull through the vertex without intravenous contrast. RADIATION DOSE REDUCTION: This exam was performed according to the departmental dose-optimization program which includes automated exposure control, adjustment of the mA and/or kV according to patient size and/or use of iterative reconstruction technique. COMPARISON:  CT head 11/12/2021 FINDINGS: Brain: Trace patchy and confluent areas of decreased attenuation are noted throughout the deep and periventricular white matter of the cerebral hemispheres bilaterally, compatible with chronic microvascular ischemic disease. Interval development of right basal ganglia hypodensity with loss of gray-white matter differentiation and associated edema. No parenchymal hemorrhage. No mass lesion. No extra-axial collection. No midline shift. No hydrocephalus. Basilar cisterns are patent. Vascular: No hyperdense vessel. Atherosclerotic calcifications are present within the cavernous internal carotid arteries. Skull: No acute fracture or focal lesion. Sinuses/Orbits: Paranasal sinuses and mastoid air cells are clear. Bilateral lens replacement. Otherwise the orbits are unremarkable. Other: None. IMPRESSION: 1. Acute to early subacute right basal ganglia infarction. Recommend MRI head noncontrast for further evaluation. 2. No intracranial hemorrhage. Electronically Signed: By: Morgane  Naveau M.D. On:  10/14/2023 23:32   US  ABDOMEN LIMITED RUQ (LIVER/GB) Result Date: 10/14/2023 CLINICAL DATA:  [id number] Transaminitis [id number] EXAM: ULTRASOUND ABDOMEN LIMITED RIGHT UPPER QUADRANT COMPARISON:  CT abdomen pelvis 08/02/2021 FINDINGS: Gallbladder: Cholelithiasis with associated gallbladder wall thickening and pericholecystic fluid. No sonographic Murphy sign noted by sonographer. Common bile duct: Diameter: 5 mm Liver: Nodular contour. No focal lesion identified. Within normal limits in parenchymal echogenicity. Portal vein is patent on color Doppler imaging with normal direction of blood flow towards the liver. Other: Trace right pleural effusion. IMPRESSION: 1. Cholelithiasis with findings suggestive of acute cholecystitis. 2. Cirrhosis. No focal liver lesions identified. Please note that liver protocol enhanced MR and CT are the most sensitive tests for the screening detection of hepatocellular carcinoma in the high risk setting of cirrhosis. 3. Trace right pleural effusion. Electronically Signed   By: Morgane  Naveau M.D.   On: 10/14/2023 23:27   DG Chest Port 1 View Result Date: 10/14/2023 CLINICAL DATA:  Questionable sepsis - evaluate for abnormality EXAM: PORTABLE CHEST 1 VIEW COMPARISON:  Chest x-ray 11/08/2022, CT chest 02/08/2023 FINDINGS: The heart and mediastinal contours are unchanged. Atherosclerotic plaque. No focal consolidation. No pulmonary edema. Interval development of at least small left pleural effusion. Pleural effusion. No pneumothorax. No acute osseous abnormality. IMPRESSION: 1. Interval development of at least small left pleural effusion. Followup PA and lateral chest X-ray is recommended in 3-4 weeks following therapy to ensure resolution and exclude underlying malignancy. 2.  Aortic Atherosclerosis (ICD10-I70.0). Electronically Signed   By: Morgane  Naveau M.D.   On: 10/14/2023 23:25    Microbiology: Recent Results (from the past 240 hours)  Blood Culture (routine x 2)     Status:  Abnormal   Collection Time: 10/14/23  7:42 PM   Specimen: BLOOD  Result Value Ref Range Status   Specimen Description   Final    BLOOD BLOOD RIGHT ARM Performed at [facility], [address] [place], [place] [address]    Special Requests   Final    BOTTLES DRAWN AEROBIC AND ANAEROBIC Blood Culture adequate volume Performed at [facility], [address] [place], [place] [address]    Culture  Setup Time   Final    GRAM POSITIVE COCCI AEROBIC BOTTLE ONLY CRITICAL RESULT CALLED TO, READ BACK BY AND VERIFIED WITH: JASON ROBBINS 10/16/23 1093  MW    Culture (A)  Final    STAPHYLOCOCCUS HAEMOLYTICUS THE SIGNIFICANCE OF ISOLATING THIS ORGANISM FROM A SINGLE SET OF BLOOD CULTURES WHEN MULTIPLE SETS ARE DRAWN IS UNCERTAIN. PLEASE NOTIFY THE MICROBIOLOGY DEPARTMENT WITHIN ONE WEEK IF SPECIATION AND SENSITIVITIES ARE REQUIRED. Performed at [facility], [address], [place], [place] [address]    Report Status 10/18/2023 FINAL  Final  Blood Culture ID Panel (Reflexed)     Status: Abnormal   Collection Time: 10/14/23  7:42 PM  Result Value Ref Range Status   Enterococcus faecalis NOT DETECTED NOT DETECTED Final   Enterococcus Faecium NOT DETECTED NOT DETECTED Final   Listeria monocytogenes NOT DETECTED NOT DETECTED Final   Staphylococcus species DETECTED (A) NOT DETECTED Final    Comment: CRITICAL RESULT CALLED TO, READ BACK BY AND VERIFIED WITH: JASON ROBBINS 10/16/23 0538 MW    Staphylococcus aureus (BCID) NOT DETECTED NOT DETECTED Final   Staphylococcus epidermidis NOT DETECTED NOT DETECTED Final   Staphylococcus lugdunensis NOT DETECTED NOT DETECTED Final   Streptococcus species NOT DETECTED NOT DETECTED Final   Streptococcus agalactiae NOT DETECTED NOT DETECTED Final   Streptococcus pneumoniae NOT DETECTED NOT DETECTED Final   Streptococcus pyogenes NOT DETECTED NOT DETECTED Final   A.calcoaceticus-baumannii NOT DETECTED NOT DETECTED Final   Bacteroides  fragilis NOT DETECTED NOT DETECTED Final   Enterobacterales NOT DETECTED NOT DETECTED Final   Enterobacter cloacae complex NOT DETECTED NOT DETECTED Final   Escherichia coli NOT DETECTED NOT DETECTED Final   Klebsiella aerogenes NOT DETECTED NOT DETECTED Final   Klebsiella oxytoca NOT DETECTED NOT DETECTED Final   Klebsiella pneumoniae NOT DETECTED NOT DETECTED Final   Proteus species NOT DETECTED NOT DETECTED Final   Salmonella species NOT DETECTED NOT DETECTED Final   Serratia marcescens NOT DETECTED NOT DETECTED Final   Haemophilus influenzae NOT DETECTED NOT DETECTED Final   Neisseria meningitidis NOT DETECTED NOT DETECTED Final   Pseudomonas aeruginosa NOT DETECTED NOT DETECTED Final   Stenotrophomonas maltophilia NOT DETECTED NOT DETECTED Final   Candida albicans NOT DETECTED NOT DETECTED Final   Candida auris NOT DETECTED NOT DETECTED Final   Candida glabrata NOT DETECTED NOT DETECTED Final   Candida krusei NOT DETECTED NOT DETECTED Final   Candida parapsilosis NOT DETECTED NOT DETECTED Final   Candida tropicalis NOT DETECTED NOT DETECTED Final   Cryptococcus neoformans/gattii NOT DETECTED NOT DETECTED Final    Comment: Performed at [facility], [address] [place], [place] [address]  Blood Culture (routine x 2)     Status: None   Collection Time: 10/14/23  7:50 PM   Specimen: BLOOD  Result Value Ref Range Status   Specimen Description BLOOD BLOOD LEFT FOREARM  Final   Special Requests   Final    BOTTLES DRAWN AEROBIC AND ANAEROBIC Blood Culture adequate volume   Culture   Final    NO GROWTH 5 DAYS Performed at [facility], [address] [place], [place] [address]    Report Status 10/19/2023 FINAL  Final  Resp panel by RT-PCR (RSV, Flu A&B, Covid) Anterior Nasal Swab     Status: None   Collection Time: 10/14/23  8:01 PM   Specimen: Anterior Nasal Swab  Result Value Ref Range Status   SARS Coronavirus 2 by RT PCR NEGATIVE NEGATIVE Final     Comment: (NOTE) SARS-CoV-2 target nucleic acids are NOT DETECTED.  The SARS-CoV-2 RNA is generally detectable in upper respiratory specimens during the acute phase of  infection. The lowest concentration of SARS-CoV-2 viral copies this assay can detect is 138 copies/mL. A negative result does not preclude SARS-Cov-2 infection and should not be used as the sole basis for treatment or other patient management decisions. A negative result may occur with  improper specimen collection/handling, submission of specimen other than nasopharyngeal swab, presence of viral mutation(s) within the areas targeted by this assay, and inadequate number of viral copies(<138 copies/mL). A negative result must be combined with clinical observations, patient history, and epidemiological information. The expected result is Negative.  Fact Sheet for Patients:  BloggerCourse.com  Fact Sheet for Healthcare Providers:  SeriousBroker.it  This test is no t yet approved or cleared by the [place]  FDA and  has been authorized for detection and/or diagnosis of SARS-CoV-2 by FDA under an Emergency Use Authorization (EUA). This EUA will remain  in effect (meaning this test can be used) for the duration of the COVID-19 declaration under Section 564(b)(1) of the Act, 21 U.S.C.section 360bbb-3(b)(1), unless the authorization is terminated  or revoked sooner.       Influenza A by PCR NEGATIVE NEGATIVE Final   Influenza B by PCR NEGATIVE NEGATIVE Final    Comment: (NOTE) The Xpert Xpress SARS-CoV-2/FLU/RSV plus assay is intended as an aid in the diagnosis of influenza from Nasopharyngeal swab specimens and should not be used as a sole basis for treatment. Nasal washings and aspirates are unacceptable for Xpert Xpress SARS-CoV-2/FLU/RSV testing.  Fact Sheet for Patients: BloggerCourse.com  Fact Sheet for Healthcare  Providers: SeriousBroker.it  This test is not yet approved or cleared by the [place]  FDA and has been authorized for detection and/or diagnosis of SARS-CoV-2 by FDA under an Emergency Use Authorization (EUA). This EUA will remain in effect (meaning this test can be used) for the duration of the COVID-19 declaration under Section 564(b)(1) of the Act, 21 U.S.C. section 360bbb-3(b)(1), unless the authorization is terminated or revoked.     Resp Syncytial Virus by PCR NEGATIVE NEGATIVE Final    Comment: (NOTE) Fact Sheet for Patients: BloggerCourse.com  Fact Sheet for Healthcare Providers: SeriousBroker.it  This test is not yet approved or cleared by the [place]  FDA and has been authorized for detection and/or diagnosis of SARS-CoV-2 by FDA under an Emergency Use Authorization (EUA). This EUA will remain in effect (meaning this test can be used) for the duration of the COVID-19 declaration under Section 564(b)(1) of the Act, 21 U.S.C. section 360bbb-3(b)(1), unless the authorization is terminated or revoked.  Performed at [facility], [address] [place], [place] [address]   Culture, blood (Routine X 2) w Reflex to ID Panel     Status: None   Collection Time: 10/16/23 10:16 AM   Specimen: BLOOD  Result Value Ref Range Status   Specimen Description BLOOD BLOOD LEFT ARM  Final   Special Requests   Final    BOTTLES DRAWN AEROBIC AND ANAEROBIC Blood Culture adequate volume   Culture   Final    NO GROWTH 5 DAYS Performed at [facility], [address], [place], [place] [address]    Report Status 10/21/2023 FINAL  Final  Culture, blood (Routine X 2) w Reflex to ID Panel     Status: None   Collection Time: 10/16/23 10:16 AM   Specimen: BLOOD  Result Value Ref Range Status   Specimen Description BLOOD BLOOD LEFT HAND  Final   Special Requests   Final    BOTTLES DRAWN  AEROBIC ONLY Blood Culture  results may not be optimal due to an inadequate volume of blood received in culture bottles   Culture   Final    NO GROWTH 5 DAYS Performed at [facility], [address] [place], [place] [address]    Report Status 10/21/2023 FINAL  Final  Body fluid culture w Gram Stain     Status: None   Collection Time: 10/17/23  4:20 PM   Specimen: PATH Cytology Pleural fluid  Result Value Ref Range Status   Specimen Description   Final    FLUID Performed at [facility], [address] [place], [place] [address]    Special Requests   Final    CYTO PLEU Performed at [facility], [address] [place], [place] [address]    Gram Stain   Final    FEW WBC PRESENT, PREDOMINANTLY MONONUCLEAR NO ORGANISMS SEEN    Culture   Final    NO GROWTH 3 DAYS Performed at [facility], [address] [place], [place] [address]    Report Status 10/21/2023 FINAL  Final  Urine Culture (for pregnant, neutropenic or urologic patients or patients with an indwelling urinary catheter)     Status: Abnormal   Collection Time: 10/19/23  6:19 PM   Specimen: Urine, Clean Catch  Result Value Ref Range Status   Specimen Description   Final    URINE, CLEAN CATCH Performed at [facility], [address] [place], [place] [address]    Special Requests   Final    NONE Performed at [facility], [address] [place], [place] [address]    Culture >=100,000 COLONIES/mL YEAST (A)  Final   Report Status 10/21/2023 FINAL  Final     Labs: CBC: Recent Labs  Lab 10/18/23 0333 10/19/23 0434 10/20/23 0428 10/21/23 0435 10/22/23 0442  WBC 9.7 8.3 9.1 8.6 9.1  HGB 14.1 14.0 14.5 13.9 14.3  HCT 42.0 40.8 42.2 40.7 42.5  MCV 94.6 91.3 91.9 91.9 93.6  PLT 160 168 199 200 218   Basic Metabolic Panel: Recent Labs  Lab 10/16/23 0423 10/17/23 0412 10/17/23 0413 10/18/23 0333 10/19/23 0434 10/20/23 0428 10/21/23 0435  10/22/23 0442  NA 137  --  140 136 139 134* 135 133*  K 3.5  --  3.1* 3.7 4.1 4.2 4.5 4.0  CL 111  --  106 103 102 98 99 99  CO2 19*  --  22 24 28 28 28 28   GLUCOSE 109*  --  88 109* 118* 125* 126* 116*  BUN 24*  --  24* 23 24* 30* 32* 29*  CREATININE 0.84  --  0.75 0.80 0.74 0.98 0.79 0.85  CALCIUM  8.1*  --  8.5* 8.1* 8.5* 9.0 8.8* 8.9  MG 2.3 2.1  --  1.9 1.9  --   --   --   PHOS 3.0  --  1.9* 3.5 3.4  --   --   --    Liver Function Tests: Recent Labs  Lab 10/17/23 0412 10/17/23 0413 10/18/23 0333 10/19/23 0434 10/21/23 0435 10/22/23 0442  AST 37  --  39 43* 67* 78*  ALT 36  --  33 30 37 44  ALKPHOS 172*  --  188* 201* 293* 361*  BILITOT 0.9  --  0.9 1.0 1.2 1.0  PROT 6.0*  --  6.4* 6.2* 6.3* 6.7  ALBUMIN  2.7* 2.8* 2.8* 2.8* 2.8* 2.9*   No results for input(s): "LIPASE", "AMYLASE" in the last 168  hours. No results for input(s): "AMMONIA" in the last 168 hours. Cardiac Enzymes: No results for input(s): "CKTOTAL", "CKMB", "CKMBINDEX", "TROPONINI" in the last 168 hours. BNP (last 3 results) No results for input(s): "BNP" in the last 8760 hours. CBG: No results for input(s): "GLUCAP" in the last 168 hours.  Time spent: 35 minutes  Signed:  Althia Atlas  [facility] 10/22/2023 1:15 PM

## 2023-10-23 ENCOUNTER — Other Ambulatory Visit: Payer: Self-pay | Admitting: Internal Medicine

## 2023-10-23 DIAGNOSIS — I10 Essential (primary) hypertension: Secondary | ICD-10-CM | POA: Diagnosis not present

## 2023-10-23 DIAGNOSIS — A419 Sepsis, unspecified organism: Secondary | ICD-10-CM | POA: Diagnosis not present

## 2023-10-23 DIAGNOSIS — I639 Cerebral infarction, unspecified: Secondary | ICD-10-CM | POA: Diagnosis not present

## 2023-10-23 DIAGNOSIS — J90 Pleural effusion, not elsewhere classified: Secondary | ICD-10-CM | POA: Diagnosis not present

## 2023-10-23 DIAGNOSIS — K760 Fatty (change of) liver, not elsewhere classified: Secondary | ICD-10-CM | POA: Diagnosis not present

## 2023-10-23 DIAGNOSIS — J410 Simple chronic bronchitis: Secondary | ICD-10-CM

## 2023-10-25 DIAGNOSIS — I10 Essential (primary) hypertension: Secondary | ICD-10-CM | POA: Diagnosis not present

## 2023-10-25 DIAGNOSIS — I639 Cerebral infarction, unspecified: Secondary | ICD-10-CM | POA: Diagnosis not present

## 2023-10-25 DIAGNOSIS — A419 Sepsis, unspecified organism: Secondary | ICD-10-CM | POA: Diagnosis not present

## 2023-10-25 DIAGNOSIS — J90 Pleural effusion, not elsewhere classified: Secondary | ICD-10-CM | POA: Diagnosis not present

## 2023-10-25 DIAGNOSIS — K760 Fatty (change of) liver, not elsewhere classified: Secondary | ICD-10-CM | POA: Diagnosis not present

## 2023-10-28 DIAGNOSIS — J90 Pleural effusion, not elsewhere classified: Secondary | ICD-10-CM | POA: Diagnosis not present

## 2023-10-28 DIAGNOSIS — A419 Sepsis, unspecified organism: Secondary | ICD-10-CM | POA: Diagnosis not present

## 2023-10-28 DIAGNOSIS — I10 Essential (primary) hypertension: Secondary | ICD-10-CM | POA: Diagnosis not present

## 2023-10-28 DIAGNOSIS — K760 Fatty (change of) liver, not elsewhere classified: Secondary | ICD-10-CM | POA: Diagnosis not present

## 2023-10-28 DIAGNOSIS — I639 Cerebral infarction, unspecified: Secondary | ICD-10-CM | POA: Diagnosis not present

## 2023-10-29 DIAGNOSIS — A419 Sepsis, unspecified organism: Secondary | ICD-10-CM | POA: Diagnosis not present

## 2023-10-29 DIAGNOSIS — M6281 Muscle weakness (generalized): Secondary | ICD-10-CM | POA: Diagnosis not present

## 2023-10-29 DIAGNOSIS — I69354 Hemiplegia and hemiparesis following cerebral infarction affecting left non-dominant side: Secondary | ICD-10-CM | POA: Diagnosis not present

## 2023-10-29 DIAGNOSIS — J189 Pneumonia, unspecified organism: Secondary | ICD-10-CM | POA: Diagnosis not present

## 2023-10-29 DIAGNOSIS — R2681 Unsteadiness on feet: Secondary | ICD-10-CM | POA: Diagnosis not present

## 2023-10-29 DIAGNOSIS — N390 Urinary tract infection, site not specified: Secondary | ICD-10-CM | POA: Diagnosis not present

## 2023-10-29 DIAGNOSIS — K7460 Unspecified cirrhosis of liver: Secondary | ICD-10-CM | POA: Diagnosis not present

## 2023-10-29 DIAGNOSIS — K760 Fatty (change of) liver, not elsewhere classified: Secondary | ICD-10-CM | POA: Diagnosis not present

## 2023-10-29 DIAGNOSIS — I10 Essential (primary) hypertension: Secondary | ICD-10-CM | POA: Diagnosis not present

## 2023-10-29 DIAGNOSIS — J90 Pleural effusion, not elsewhere classified: Secondary | ICD-10-CM | POA: Diagnosis not present

## 2023-10-29 DIAGNOSIS — I639 Cerebral infarction, unspecified: Secondary | ICD-10-CM | POA: Diagnosis not present

## 2023-10-30 DIAGNOSIS — I639 Cerebral infarction, unspecified: Secondary | ICD-10-CM | POA: Diagnosis not present

## 2023-10-30 DIAGNOSIS — I10 Essential (primary) hypertension: Secondary | ICD-10-CM | POA: Diagnosis not present

## 2023-10-30 DIAGNOSIS — K760 Fatty (change of) liver, not elsewhere classified: Secondary | ICD-10-CM | POA: Diagnosis not present

## 2023-10-30 DIAGNOSIS — A419 Sepsis, unspecified organism: Secondary | ICD-10-CM | POA: Diagnosis not present

## 2023-10-30 DIAGNOSIS — J90 Pleural effusion, not elsewhere classified: Secondary | ICD-10-CM | POA: Diagnosis not present

## 2023-11-01 DIAGNOSIS — K7460 Unspecified cirrhosis of liver: Secondary | ICD-10-CM | POA: Diagnosis not present

## 2023-11-01 DIAGNOSIS — M6281 Muscle weakness (generalized): Secondary | ICD-10-CM | POA: Diagnosis not present

## 2023-11-01 DIAGNOSIS — N390 Urinary tract infection, site not specified: Secondary | ICD-10-CM | POA: Diagnosis not present

## 2023-11-01 DIAGNOSIS — R2681 Unsteadiness on feet: Secondary | ICD-10-CM | POA: Diagnosis not present

## 2023-11-01 DIAGNOSIS — I69354 Hemiplegia and hemiparesis following cerebral infarction affecting left non-dominant side: Secondary | ICD-10-CM | POA: Diagnosis not present

## 2023-11-01 DIAGNOSIS — Z7982 Long term (current) use of aspirin: Secondary | ICD-10-CM | POA: Diagnosis not present

## 2023-11-01 DIAGNOSIS — Z5189 Encounter for other specified aftercare: Secondary | ICD-10-CM | POA: Diagnosis not present

## 2023-11-01 DIAGNOSIS — I10 Essential (primary) hypertension: Secondary | ICD-10-CM | POA: Diagnosis not present

## 2023-11-01 DIAGNOSIS — J189 Pneumonia, unspecified organism: Secondary | ICD-10-CM | POA: Diagnosis not present

## 2023-11-01 DIAGNOSIS — J90 Pleural effusion, not elsewhere classified: Secondary | ICD-10-CM | POA: Diagnosis not present

## 2023-11-05 DIAGNOSIS — I69354 Hemiplegia and hemiparesis following cerebral infarction affecting left non-dominant side: Secondary | ICD-10-CM | POA: Diagnosis not present

## 2023-11-05 DIAGNOSIS — K7460 Unspecified cirrhosis of liver: Secondary | ICD-10-CM | POA: Diagnosis not present

## 2023-11-05 DIAGNOSIS — M6281 Muscle weakness (generalized): Secondary | ICD-10-CM | POA: Diagnosis not present

## 2023-11-05 DIAGNOSIS — N390 Urinary tract infection, site not specified: Secondary | ICD-10-CM | POA: Diagnosis not present

## 2023-11-05 DIAGNOSIS — R2681 Unsteadiness on feet: Secondary | ICD-10-CM | POA: Diagnosis not present

## 2023-11-05 DIAGNOSIS — J189 Pneumonia, unspecified organism: Secondary | ICD-10-CM | POA: Diagnosis not present

## 2023-11-06 DIAGNOSIS — I10 Essential (primary) hypertension: Secondary | ICD-10-CM | POA: Diagnosis not present

## 2023-11-06 DIAGNOSIS — A419 Sepsis, unspecified organism: Secondary | ICD-10-CM | POA: Diagnosis not present

## 2023-11-06 DIAGNOSIS — J90 Pleural effusion, not elsewhere classified: Secondary | ICD-10-CM | POA: Diagnosis not present

## 2023-11-06 DIAGNOSIS — K7460 Unspecified cirrhosis of liver: Secondary | ICD-10-CM | POA: Diagnosis not present

## 2023-11-06 DIAGNOSIS — I639 Cerebral infarction, unspecified: Secondary | ICD-10-CM | POA: Diagnosis not present

## 2023-11-06 DIAGNOSIS — K760 Fatty (change of) liver, not elsewhere classified: Secondary | ICD-10-CM | POA: Diagnosis not present

## 2023-11-06 DIAGNOSIS — Z5189 Encounter for other specified aftercare: Secondary | ICD-10-CM | POA: Diagnosis not present

## 2023-11-06 DIAGNOSIS — I69354 Hemiplegia and hemiparesis following cerebral infarction affecting left non-dominant side: Secondary | ICD-10-CM | POA: Diagnosis not present

## 2023-11-06 DIAGNOSIS — Z7982 Long term (current) use of aspirin: Secondary | ICD-10-CM | POA: Diagnosis not present

## 2023-11-08 DIAGNOSIS — R2681 Unsteadiness on feet: Secondary | ICD-10-CM | POA: Diagnosis not present

## 2023-11-08 DIAGNOSIS — M6281 Muscle weakness (generalized): Secondary | ICD-10-CM | POA: Diagnosis not present

## 2023-11-08 DIAGNOSIS — I69354 Hemiplegia and hemiparesis following cerebral infarction affecting left non-dominant side: Secondary | ICD-10-CM | POA: Diagnosis not present

## 2023-11-08 DIAGNOSIS — N390 Urinary tract infection, site not specified: Secondary | ICD-10-CM | POA: Diagnosis not present

## 2023-11-08 DIAGNOSIS — K7460 Unspecified cirrhosis of liver: Secondary | ICD-10-CM | POA: Diagnosis not present

## 2023-11-08 DIAGNOSIS — J189 Pneumonia, unspecified organism: Secondary | ICD-10-CM | POA: Diagnosis not present

## 2023-11-13 DIAGNOSIS — Z7982 Long term (current) use of aspirin: Secondary | ICD-10-CM | POA: Diagnosis not present

## 2023-11-13 DIAGNOSIS — I10 Essential (primary) hypertension: Secondary | ICD-10-CM | POA: Diagnosis not present

## 2023-11-13 DIAGNOSIS — Z5189 Encounter for other specified aftercare: Secondary | ICD-10-CM | POA: Diagnosis not present

## 2023-11-13 DIAGNOSIS — K7460 Unspecified cirrhosis of liver: Secondary | ICD-10-CM | POA: Diagnosis not present

## 2023-11-13 DIAGNOSIS — J90 Pleural effusion, not elsewhere classified: Secondary | ICD-10-CM | POA: Diagnosis not present

## 2023-11-13 DIAGNOSIS — I69354 Hemiplegia and hemiparesis following cerebral infarction affecting left non-dominant side: Secondary | ICD-10-CM | POA: Diagnosis not present

## 2023-11-19 DIAGNOSIS — I69354 Hemiplegia and hemiparesis following cerebral infarction affecting left non-dominant side: Secondary | ICD-10-CM | POA: Diagnosis not present

## 2023-11-19 DIAGNOSIS — I6930 Unspecified sequelae of cerebral infarction: Secondary | ICD-10-CM | POA: Diagnosis not present

## 2023-11-19 DIAGNOSIS — K760 Fatty (change of) liver, not elsewhere classified: Secondary | ICD-10-CM | POA: Diagnosis not present

## 2023-11-19 DIAGNOSIS — Z7982 Long term (current) use of aspirin: Secondary | ICD-10-CM | POA: Diagnosis not present

## 2023-11-19 DIAGNOSIS — K7460 Unspecified cirrhosis of liver: Secondary | ICD-10-CM | POA: Diagnosis not present

## 2023-11-19 DIAGNOSIS — I10 Essential (primary) hypertension: Secondary | ICD-10-CM | POA: Diagnosis not present

## 2023-11-19 DIAGNOSIS — M6281 Muscle weakness (generalized): Secondary | ICD-10-CM | POA: Diagnosis not present

## 2023-11-29 DIAGNOSIS — K7460 Unspecified cirrhosis of liver: Secondary | ICD-10-CM | POA: Diagnosis not present

## 2023-11-29 DIAGNOSIS — N390 Urinary tract infection, site not specified: Secondary | ICD-10-CM | POA: Diagnosis not present

## 2023-11-29 DIAGNOSIS — J189 Pneumonia, unspecified organism: Secondary | ICD-10-CM | POA: Diagnosis not present

## 2023-11-29 DIAGNOSIS — M6281 Muscle weakness (generalized): Secondary | ICD-10-CM | POA: Diagnosis not present

## 2023-11-29 DIAGNOSIS — R2681 Unsteadiness on feet: Secondary | ICD-10-CM | POA: Diagnosis not present

## 2023-11-29 DIAGNOSIS — I69354 Hemiplegia and hemiparesis following cerebral infarction affecting left non-dominant side: Secondary | ICD-10-CM | POA: Diagnosis not present

## 2023-12-03 DIAGNOSIS — Z7982 Long term (current) use of aspirin: Secondary | ICD-10-CM | POA: Diagnosis not present

## 2023-12-03 DIAGNOSIS — K7460 Unspecified cirrhosis of liver: Secondary | ICD-10-CM | POA: Diagnosis not present

## 2023-12-03 DIAGNOSIS — K760 Fatty (change of) liver, not elsewhere classified: Secondary | ICD-10-CM | POA: Diagnosis not present

## 2023-12-03 DIAGNOSIS — J189 Pneumonia, unspecified organism: Secondary | ICD-10-CM | POA: Diagnosis not present

## 2023-12-03 DIAGNOSIS — I6930 Unspecified sequelae of cerebral infarction: Secondary | ICD-10-CM | POA: Diagnosis not present

## 2023-12-03 DIAGNOSIS — I69354 Hemiplegia and hemiparesis following cerebral infarction affecting left non-dominant side: Secondary | ICD-10-CM | POA: Diagnosis not present

## 2023-12-03 DIAGNOSIS — M6281 Muscle weakness (generalized): Secondary | ICD-10-CM | POA: Diagnosis not present

## 2023-12-03 DIAGNOSIS — R2681 Unsteadiness on feet: Secondary | ICD-10-CM | POA: Diagnosis not present

## 2023-12-03 DIAGNOSIS — N390 Urinary tract infection, site not specified: Secondary | ICD-10-CM | POA: Diagnosis not present

## 2023-12-03 DIAGNOSIS — I10 Essential (primary) hypertension: Secondary | ICD-10-CM | POA: Diagnosis not present

## 2023-12-05 ENCOUNTER — Telehealth: Payer: Self-pay

## 2023-12-05 DIAGNOSIS — E119 Type 2 diabetes mellitus without complications: Secondary | ICD-10-CM | POA: Diagnosis not present

## 2023-12-05 DIAGNOSIS — E785 Hyperlipidemia, unspecified: Secondary | ICD-10-CM | POA: Diagnosis not present

## 2023-12-05 DIAGNOSIS — K760 Fatty (change of) liver, not elsewhere classified: Secondary | ICD-10-CM | POA: Diagnosis not present

## 2023-12-05 DIAGNOSIS — J449 Chronic obstructive pulmonary disease, unspecified: Secondary | ICD-10-CM | POA: Diagnosis not present

## 2023-12-05 DIAGNOSIS — G4733 Obstructive sleep apnea (adult) (pediatric): Secondary | ICD-10-CM | POA: Diagnosis not present

## 2023-12-05 DIAGNOSIS — E039 Hypothyroidism, unspecified: Secondary | ICD-10-CM | POA: Diagnosis not present

## 2023-12-05 DIAGNOSIS — I69354 Hemiplegia and hemiparesis following cerebral infarction affecting left non-dominant side: Secondary | ICD-10-CM | POA: Diagnosis not present

## 2023-12-05 DIAGNOSIS — I10 Essential (primary) hypertension: Secondary | ICD-10-CM | POA: Diagnosis not present

## 2023-12-05 NOTE — Transitions of Care (Post Inpatient/ED Visit) (Signed)
   12/05/2023  Name: Lauren Morris MRN: [id number] DOB: 23-Mar-1932  Today's TOC FU Call Status: Today's TOC FU Call Status:: Unsuccessful Call (1st Attempt) Unsuccessful Call (1st Attempt) Date: 12/05/23  Attempted to reach the patient regarding the most recent Inpatient/ED visit.  Follow Up Plan: Additional outreach attempts will be made to reach the patient to complete the Transitions of Care (Post Inpatient/ED visit) call.   Signature Darrall Ellison, LPN [facility] Nurse Health Advisor Direct Dial [phone]

## 2023-12-06 NOTE — Transitions of Care (Post Inpatient/ED Visit) (Unsigned)
   12/06/2023  Name: Lauren Morris MRN: [id number] DOB: Apr 14, 1932  Today's TOC FU Call Status: Today's TOC FU Call Status:: Unsuccessful Call (2nd Attempt) Unsuccessful Call (1st Attempt) Date: 12/05/23 Unsuccessful Call (2nd Attempt) Date: 12/06/23  Attempted to reach the patient regarding the most recent Inpatient/ED visit.  Follow Up Plan: Additional outreach attempts will be made to reach the patient to complete the Transitions of Care (Post Inpatient/ED visit) call.   Signature Darrall Ellison, LPN [facility] Nurse Health Advisor Direct Dial [phone]

## 2023-12-09 DIAGNOSIS — E119 Type 2 diabetes mellitus without complications: Secondary | ICD-10-CM | POA: Diagnosis not present

## 2023-12-09 DIAGNOSIS — I69354 Hemiplegia and hemiparesis following cerebral infarction affecting left non-dominant side: Secondary | ICD-10-CM | POA: Diagnosis not present

## 2023-12-09 DIAGNOSIS — K760 Fatty (change of) liver, not elsewhere classified: Secondary | ICD-10-CM | POA: Diagnosis not present

## 2023-12-09 DIAGNOSIS — J449 Chronic obstructive pulmonary disease, unspecified: Secondary | ICD-10-CM | POA: Diagnosis not present

## 2023-12-09 DIAGNOSIS — E039 Hypothyroidism, unspecified: Secondary | ICD-10-CM | POA: Diagnosis not present

## 2023-12-09 DIAGNOSIS — I10 Essential (primary) hypertension: Secondary | ICD-10-CM | POA: Diagnosis not present

## 2023-12-09 NOTE — Transitions of Care (Post Inpatient/ED Visit) (Signed)
 12/09/2023  Name: Lauren Morris MRN: [id number] DOB: 01/11/1932  Today's TOC FU Call Status: Today's TOC FU Call Status:: Successful TOC FU Call Completed Unsuccessful Call (1st Attempt) Date: 12/05/23 Unsuccessful Call (2nd Attempt) Date: 12/06/23 [facility] FU Call Complete Date: 12/09/23 Patient's Name and Date of Birth confirmed.  Transition Care Management Follow-up Telephone Call Date of Discharge: 12/04/23 Discharge Facility: Other [facility]) Name of Other (Non-[facility]) Discharge Facility: [facility] Type of Discharge: Inpatient Admission Primary Inpatient Discharge Diagnosis:: sepsis How have you been since you were released from the hospital?: Better Any questions or concerns?: No  Items Reviewed: Did you receive and understand the discharge instructions provided?: Yes Medications obtained,verified, and reconciled?: Yes (Medications Reviewed) Any new allergies since your discharge?: No Dietary orders reviewed?: Yes Do you have support at home?: Yes People in Home [RPT]: child(ren), adult Name of Support/Comfort Primary Source: always Best care  Medications Reviewed Today: Medications Reviewed Today     Reviewed by Darrall Ellison, LPN (Licensed Practical Nurse) on 12/09/23 at 1508  Med List Status: <None>   Medication Order Taking? Sig Documenting Provider Last Dose Status Informant  albuterol  (PROVENTIL ) (2.5 MG/3ML) 0.083% nebulizer solution [id number] Yes Take 3 mLs (2.5 mg total) by nebulization every 6 (six) hours as needed for wheezing or shortness of breath. Tiajuana Fluke, MD  Active Pharmacy Records, Child, Multiple Informants  ANORO ELLIPTA  62.5-25 MCG/ACT AEPB [id number] Yes TAKE 1 PUFF BY MOUTH EVERY DAY Thersia Flax, MD  Active   Ascorbic Acid  (VITAMIN C ) 1000 MG tablet [id number] Yes Take 1 tablet (1,000 mg total) by mouth daily. Thersia Flax, MD  Active Child, Pharmacy Records, Multiple Informants  Aspirin  81 MG CAPS [id number] Yes Take 1  capsule by mouth daily at 6 (six) AM. [provider]  Active Child, Pharmacy Records, Multiple Informants  atenolol  (TENORMIN ) 25 MG tablet [id number] Yes Take 1 tablet (25 mg total) by mouth 2 (two) times daily. Althia Atlas, MD  Active   atorvastatin  (LIPITOR) 40 MG tablet [id number] Yes Take 1 tablet (40 mg total) by mouth daily. Althia Atlas, MD  Active   Calcium  Carb-Cholecalciferol (CALCIUM  1000 + D PO) [id number] Yes Take 1 tablet by mouth daily. [provider]  Active Child, Pharmacy Records, Multiple Informants  Difluprednate 0.05 % EMUL [id number] Yes Place 1 drop into the left eye 2 (two) times daily. [provider]  Active Pharmacy Records, Child, Multiple Informants  famotidine  (PEPCID ) 20 MG tablet [id number] Yes Take 1 tablet (20 mg total) by mouth at bedtime. Althia Atlas, MD  Active   fish oil-omega-3 fatty acids  1000 MG capsule [id number] Yes Take 2 capsules (2 g total) by mouth daily. Thersia Flax, MD  Active Child, Pharmacy Records, Multiple Informants  guaiFENesin  (MUCINEX ) 600 MG 12 hr tablet [id number]  Take 1 tablet (600 mg total) by mouth 2 (two) times daily.  Patient not taking: Reported on 12/09/2023   Tiajuana Fluke, MD  Active Pharmacy Records, Child, Multiple Informants           Med Note Annette Barters, Nigel Bart   Mon Oct 14, 2023 10:22 PM) PRN  Multiple Vitamin (MULTIVITAMIN WITH MINERALS) TABS tablet [id number] Yes Take 1 tablet by mouth daily. Regalado, Belkys A, MD  Active Child, Pharmacy Records, Multiple Informants  potassium chloride  SA (KLOR-CON  M) 20 MEQ tablet [id number] Yes Take 1 tablet (20 mEq total) by mouth daily. Althia Atlas, MD  Active   prednisoLONE  acetate (PREDNISOLONE  ACETATE P-F) 1 %  ophthalmic suspension [id number] Yes Place 2 drops into the left eye 2 (two) times daily. [provider]  Active Pharmacy Records, Child, Multiple Informants  RESTASIS  0.05 % ophthalmic emulsion [id number] Yes Place 1 drop into both eyes  2 (two) times daily. [provider]  Active Child, Pharmacy Records, Multiple Informants           Med Note (MAY, ANITA A   Mon Oct 15, 2022  9:18 AM)    telmisartan  (MICARDIS ) 40 MG tablet [id number] Yes Take 1 tablet (40 mg total) by mouth daily. Hold if SBP less than 130 mmHg Althia Atlas, MD  Active   timolol  (TIMOPTIC ) 0.5 % ophthalmic solution [id number] Yes SMARTSIG:In Eye(s) [provider]  Active Pharmacy Records, Child, Multiple Informants  Torsemide  40 MG TABS [id number] Yes Take 40 mg by mouth daily at 12 noon. Althia Atlas, MD  Active   valACYclovir  (VALTREX ) 500 MG tablet [id number] Yes Take 500 mg by mouth at bedtime. [provider]  Active Child, Pharmacy Records, Multiple Informants  Zinc  Oxide (TRIPLE PASTE) 12.8 % ointment [id number] Yes Apply 1 Application topically. Every shift [provider]  Active Pharmacy Records, Child, Multiple Informants  zinc  sulfate 220 (50 Zn) MG capsule [id number] Yes Take 1 capsule (220 mg total) by mouth daily. Thersia Flax, MD  Active Child, Pharmacy Records, Multiple Informants            Home Care and Equipment/Supplies: Were Home Health Services Ordered?: Yes Name of Home Health Agency:: [facility] Has Agency set up a time to come to your home?: Yes First Home Health Visit Date: 12/09/23 Any new equipment or medical supplies ordered?: NA  Functional Questionnaire: Do you need assistance with bathing/showering or dressing?: Yes Do you need assistance with meal preparation?: No Do you need assistance with eating?: No Do you have difficulty maintaining continence: No Do you need assistance with getting out of bed/getting out of a chair/moving?: No Do you have difficulty managing or taking your medications?: Yes  Follow up appointments reviewed: PCP Follow-up appointment confirmed?: Yes Date of PCP follow-up appointment?: 12/10/23 Follow-up Provider: [facility] Follow-up  appointment confirmed?: NA Do you need transportation to your follow-up appointment?: No Do you understand care options if your condition(s) worsen?: Yes-patient verbalized understanding    SIGNATURE Darrall Ellison, LPN [facility] Nurse Health Advisor Direct Dial [phone]

## 2023-12-10 ENCOUNTER — Ambulatory Visit (INDEPENDENT_AMBULATORY_CARE_PROVIDER_SITE_OTHER)

## 2023-12-10 ENCOUNTER — Ambulatory Visit: Admitting: Family

## 2023-12-10 ENCOUNTER — Encounter: Payer: Self-pay | Admitting: Family

## 2023-12-10 VITALS — BP 118/70 | HR 65 | Temp 97.4°F | Ht 62.0 in | Wt 153.8 lb

## 2023-12-10 DIAGNOSIS — J90 Pleural effusion, not elsewhere classified: Secondary | ICD-10-CM

## 2023-12-10 DIAGNOSIS — K7469 Other cirrhosis of liver: Secondary | ICD-10-CM

## 2023-12-10 DIAGNOSIS — I639 Cerebral infarction, unspecified: Secondary | ICD-10-CM | POA: Insufficient documentation

## 2023-12-10 DIAGNOSIS — Z09 Encounter for follow-up examination after completed treatment for conditions other than malignant neoplasm: Secondary | ICD-10-CM

## 2023-12-10 DIAGNOSIS — K7460 Unspecified cirrhosis of liver: Secondary | ICD-10-CM | POA: Insufficient documentation

## 2023-12-10 DIAGNOSIS — Z8673 Personal history of transient ischemic attack (TIA), and cerebral infarction without residual deficits: Secondary | ICD-10-CM

## 2023-12-10 DIAGNOSIS — I672 Cerebral atherosclerosis: Secondary | ICD-10-CM | POA: Insufficient documentation

## 2023-12-10 DIAGNOSIS — R918 Other nonspecific abnormal finding of lung field: Secondary | ICD-10-CM | POA: Diagnosis not present

## 2023-12-10 LAB — COMPREHENSIVE METABOLIC PANEL WITH GFR
ALT: 115 U/L — ABNORMAL HIGH (ref 0–35)
AST: 127 U/L — ABNORMAL HIGH (ref 0–37)
Albumin: 3.6 g/dL (ref 3.5–5.2)
Alkaline Phosphatase: 758 U/L — ABNORMAL HIGH (ref 39–117)
BUN: 18 mg/dL (ref 6–23)
CO2: 26 meq/L (ref 19–32)
Calcium: 9.5 mg/dL (ref 8.4–10.5)
Chloride: 103 meq/L (ref 96–112)
Creatinine, Ser: 0.75 mg/dL (ref 0.40–1.20)
GFR: 70.56 mL/min (ref >60.00)
Glucose, Bld: 83 mg/dL (ref 70–99)
Potassium: 4.3 meq/L (ref 3.5–5.1)
Sodium: 136 meq/L (ref 135–145)
Total Bilirubin: 1.2 mg/dL (ref 0.2–1.2)
Total Protein: 7.6 g/dL (ref 6.0–8.3)

## 2023-12-10 LAB — URINALYSIS, ROUTINE W REFLEX MICROSCOPIC
Bilirubin Urine: NEGATIVE
Hgb urine dipstick: NEGATIVE
Ketones, ur: NEGATIVE
Nitrite: POSITIVE — AB
RBC / HPF: NONE SEEN (ref 0–?)
Specific Gravity, Urine: 1.01 (ref 1.000–1.030)
Total Protein, Urine: NEGATIVE
Urine Glucose: NEGATIVE
Urobilinogen, UA: 0.2 (ref 0.0–1.0)
pH: 6 (ref 5.0–8.0)

## 2023-12-10 MED ORDER — TORSEMIDE 20 MG PO TABS
ORAL_TABLET | ORAL | 2 refills | Status: DC
Start: 2023-12-10 — End: 2023-12-13

## 2023-12-10 MED ORDER — POTASSIUM CHLORIDE CRYS ER 20 MEQ PO TBCR: EXTENDED_RELEASE_TABLET | ORAL | 2 refills | Status: DC

## 2023-12-10 MED ORDER — TELMISARTAN 20 MG PO TABS: 10.0000 mg | ORAL_TABLET | Freq: Every day | ORAL | 2 refills | Status: DC

## 2023-12-10 NOTE — Progress Notes (Signed)
 Assessment & Plan:  Hospital discharge follow-up Assessment & Plan: Medications reconciled. Reviewed hospital course.  Blood pressure low end normal today without telmisartan  40mg . H/o urine proteinuria and DM. Advised to resume telmisartan  10mg  and titrate.  Continue atenolol  25mg  BID for now. Consider dose decrease based on HR and if telmisartan  needs to be increased.  In setting of grade 2 diastolic dysfunction, severe PAH, moderate left pleural effusion, and BLE, continue torsemide  , however due to elevation in crt, trial decrease torsemide  from 40mg  daily to 20mg  every 2-3 days . Likely increase after labs return.  Counseled on taking potassium chloride  on days she takes torsemide . Monitor.  She remains on secondary stroke prevention, asa 81mg  , lipitor 40mg . Referral placed to neurology , Dr Mason Sole, consult after CVA, surveillance of meningioma.  Referral to gastroenterology for surveillance of cirrhosis,splenomegaly, transaminitis, and cholelithiasis.  Discontinued pepcid  as started at discharge for unclear reasons; patient is asymptomatic. Monitor.   Pending labs, CXR. Close follow up in one week.   Orders: -     DG Chest 2 View; Future -     Comprehensive metabolic panel with GFR -     Urinalysis, Routine w reflex microscopic -     Torsemide ; Take 1 tablet every 2-3 days for leg swelling.  Dispense: 30 tablet; Refill: 2 -     Telmisartan ; Take 0.5 tablets (10 mg total) by mouth daily.  Dispense: 30 tablet; Refill: 2 -     Ambulatory referral to Gastroenterology -     Ambulatory referral to Neurology -     Potassium Chloride  [facility]; Take 1 tablet daily on the days you take torsemide  20mg .  Dispense: 30 tablet; Refill: 2  Cerebrovascular accident (CVA), unspecified mechanism (HCC)  Other cirrhosis of liver (HCC) Assessment & Plan: Referral to gastroenterology for surveillance.       Return precautions given.   Risks, benefits, and alternatives of the medications and  treatment plan prescribed today were discussed, and patient expressed understanding.   Education regarding symptom management and diagnosis given to patient on AVS either electronically or printed.  Return in about 1 week (around 12/17/2023).  Bascom Bossier, FNP  Subjective:    Patient ID: Lauren Morris, female    DOB: 10-04-1931, 89 y.o.   MRN: [id number]  CC: Lauren Morris is a 89 y.o. female who presents today for follow up.   HPI: Accompanied by daughter, Franky Ivanoff She has been home for 6 days after 6 week stay at skilled nurse facilty .  She has 24 hour care with medical assistant at home. Lauren Morris sleeps there at night.     Normal weight 155lbs.  Appetite is good.  She is using walker.  She has some left sided weakness. She using rollator.   Completed PT, OT during SNF.   She has not taking telmisartan  40mg  for past 6 days since home from SNF nor has she been taking demadex  or Kcl. She was waiting on this appointment to discuss medications.   She has been taking atenolol  25mg  BID after leaving SNF and remains compliant with ASA 81mg  , lipitor 40mg  QD  BL leg swelling.  SOB at baseline. Denies orthopnea.   TOC call completed 12/05/23  Admitted 10/14/23 and discharged 10/22/23 to SNF, [facility] for 6 weeks.  Presented to ED for AMS, difficulty walking  Admitted for septic shock, acute CVA, cholecystitis, started on IV rocephin , flagyl .  Augmentin  dced d/t facial rash.   Unknown cause of alk  phos elevated  Acute hypoxic respiratory failure with pulmonary edema and pleural effusion.  Given Lasix  80mg  , duoneb prn IR consulted for thoracentesis 10/17/23 , 650 ml fluid tapped.  Transaminitis likely secondary to NAFLD & cirrhosis.   Given diflucan  200mg  daily x 2 weeks  CXR Bilateral reticular interstitial infiltrates with a small bilateral pleural effusions left more than right.   10/16/23 MRI liver cirrhosis, ascites and bilateral effusion , cholelithiasis  MRI showed right basal  ganglia infarct, no hemorrhage or mass effect.   TTE LVEF 60 to 65%, no WMA, grade 2 diastolic dysfunction, severe PAH, moderate left pleural effusion, no significant valvular abnormality.   Repeat mri brain 10/19/23 normal expected interval evolution of previously identified right middle ganglia infarct. Small right posterior fossa meningioma   UA 10/19/23 negative RBC, WBCs. Urine culture with > 100K yeast  Neurology consult Dr Alecia Ames 10/16/23 Echo without evidence of embolic source and telemetry without evidence of atrial fibrillation. Secondary stroke prevention will remain antiplatelet therapy with aspirin  81 mg daily and high intensity statin.   Discharge summary  Discharged on pepcid  20mg  qd Discharged on torsemide  40mg  every day.  holding all home anti-HTN meds secondary to hypotension ; BP improved, resumed home meds on discharge. Monitor BP and titrate medications accordingly   H/o DM. A1c 5.9   Crt 1.22 11/13/23 ; prior Crt 0.85  Urine protein increased ; Dr Madelon Scheuermann increased telmisartan  to 80mg  08/2023   10/15/23 Echocardiogram LVEF  60-65%, grade II diastolic dysfunction , wo evidence of embolic source.   Denies trouble swallowing, epigastric pain.    Allergies: Augmentin  [amoxicillin -pot clavulanate] Current Outpatient Medications on File Prior to Visit  Medication Sig Dispense Refill   albuterol  (PROVENTIL ) (2.5 MG/3ML) 0.083% nebulizer solution Take 3 mLs (2.5 mg total) by nebulization every 6 (six) hours as needed for wheezing or shortness of breath. 75 mL 12   ANORO ELLIPTA  62.5-25 MCG/ACT AEPB TAKE 1 PUFF BY MOUTH EVERY DAY 60 each 5   Ascorbic Acid  (VITAMIN C ) 1000 MG tablet Take 1 tablet (1,000 mg total) by mouth daily. 90 tablet 3   Aspirin  81 MG CAPS Take 1 capsule by mouth daily at 6 (six) AM.     atenolol  (TENORMIN ) 25 MG tablet Take 1 tablet (25 mg total) by mouth 2 (two) times daily. 180 tablet 1   atorvastatin  (LIPITOR) 40 MG tablet Take 1 tablet (40 mg  total) by mouth daily.     Calcium  Carb-Cholecalciferol (CALCIUM  1000 + D PO) Take 1 tablet by mouth daily.     Difluprednate 0.05 % EMUL Place 1 drop into the left eye 2 (two) times daily.     famotidine  (PEPCID ) 20 MG tablet Take 1 tablet (20 mg total) by mouth at bedtime.     fish oil-omega-3 fatty acids  1000 MG capsule Take 2 capsules (2 g total) by mouth daily. 6 capsule 11   Multiple Vitamin (MULTIVITAMIN WITH MINERALS) TABS tablet Take 1 tablet by mouth daily. 30 tablet 0   prednisoLONE  acetate (PREDNISOLONE  ACETATE P-F) 1 % ophthalmic suspension Place 2 drops into the left eye 2 (two) times daily.     RESTASIS  0.05 % ophthalmic emulsion Place 1 drop into both eyes 2 (two) times daily.     timolol  (TIMOPTIC ) 0.5 % ophthalmic solution SMARTSIG:In Eye(s)     valACYclovir  (VALTREX ) 500 MG tablet Take 500 mg by mouth at bedtime.     Zinc  Oxide (TRIPLE PASTE) 12.8 % ointment Apply 1 Application topically. Every shift  zinc  sulfate 220 (50 Zn) MG capsule Take 1 capsule (220 mg total) by mouth daily. 30 capsule 11   No current facility-administered medications on file prior to visit.    Review of Systems  Constitutional:  Negative for chills, fever and unexpected weight change.  HENT:  Negative for congestion.   Respiratory:  Positive for shortness of breath (baseline). Negative for cough.   Cardiovascular:  Positive for leg swelling. Negative for chest pain and palpitations.  Gastrointestinal:  Negative for nausea and vomiting.  Musculoskeletal:  Negative for arthralgias and myalgias.  Skin:  Negative for rash.  Neurological:  Negative for headaches.  Hematological:  Negative for adenopathy.  Psychiatric/Behavioral:  Negative for confusion.       Objective:    BP 118/70   Pulse 65   Temp (!) 97.4 F (36.3 C) (Oral)   Ht 5' 2 (1.575 m)   Wt 153 lb 12.8 oz (69.8 kg)   SpO2 96%   BMI 28.13 kg/m  BP Readings from Last 3 Encounters:  12/10/23 118/70  10/22/23 (!) 152/59   09/10/23 138/74   Wt Readings from Last 3 Encounters:  12/10/23 153 lb 12.8 oz (69.8 kg)  10/14/23 167 lb 14.4 oz (76.2 kg)  09/10/23 159 lb 3.2 oz (72.2 kg)    Physical Exam Vitals reviewed.  Constitutional:      Appearance: She is well-developed.   Eyes:     Conjunctiva/sclera: Conjunctivae normal.    Cardiovascular:     Rate and Rhythm: Normal rate and regular rhythm.     Pulses: Normal pulses.     Heart sounds: Normal heart sounds.     Comments: +1 pitting edema BLE.  No palpable cords or masses. No erythema or increased warmth. No asymmetry in calf size when compared bilaterally LE hair growth symmetric and present. No discoloration or varicosities noted. LE warm and palpable pedal pulses.  Pulmonary:     Effort: Pulmonary effort is normal.     Breath sounds: Normal breath sounds. No wheezing, rhonchi or rales.   Musculoskeletal:     Right lower leg: Edema present.     Left lower leg: Edema present.   Skin:    General: Skin is warm and dry.   Neurological:     Mental Status: She is alert.   Psychiatric:        Speech: Speech normal.        Behavior: Behavior normal.        Thought Content: Thought content normal.

## 2023-12-10 NOTE — Patient Instructions (Addendum)
 I have refilled the fluid pill, called torsemide , however I have trial decreased from 40mg  daily to 20mg  every 2-3 days to manage leg swelling.   Due to low blood pressure, decrease telmisartan  from 80mg  ( increased by Dr Madelon Scheuermann 08/2023) to 10mg  daily due to history of diabetes and protein in your urine.   Compression stockings; foot elevation  Spot check blood pressure at home  Referral to neurology and gastroenterology  Let us  know if you dont hear back within a week in regards to an appointment being scheduled.   So that you are aware, if you are [facility] MyChart user , please pay attention to your MyChart messages as you may receive a MyChart message with a phone number to call and schedule this test/appointment own your own from our referral coordinator. This is a new process so I do not want you to miss this message.  If you are not a MyChart user, you will receive a phone call.  9

## 2023-12-10 NOTE — Assessment & Plan Note (Addendum)
 Medications reconciled. Reviewed hospital course.  Blood pressure low end normal today without telmisartan  40mg . H/o urine proteinuria and DM. Advised to resume telmisartan  10mg  and titrate.  Continue atenolol  25mg  BID for now. Consider dose decrease based on HR and if telmisartan  needs to be increased.  In setting of grade 2 diastolic dysfunction, severe PAH, moderate left pleural effusion, and BLE, continue torsemide  , however due to elevation in crt, trial decrease torsemide  from 40mg  daily to 20mg  every 2-3 days . Likely increase after labs return.  Counseled on taking potassium chloride  20meq on days she takes torsemide . Monitor.  She remains on secondary stroke prevention, asa 81mg  , lipitor 40mg . Referral placed to neurology , Dr Mason Sole, consult after CVA, surveillance of meningioma.  Referral to gastroenterology for surveillance of cirrhosis,splenomegaly, transaminitis, and cholelithiasis.  Discontinued pepcid  as started at discharge for unclear reasons; patient is asymptomatic. Monitor.   Pending labs, CXR. Close follow up in one week.

## 2023-12-10 NOTE — Assessment & Plan Note (Signed)
 Referral to gastroenterology for surveillance.

## 2023-12-11 ENCOUNTER — Telehealth: Payer: Self-pay | Admitting: Family

## 2023-12-11 DIAGNOSIS — I7102 Dissection of abdominal aorta: Secondary | ICD-10-CM | POA: Insufficient documentation

## 2023-12-11 DIAGNOSIS — H16223 Keratoconjunctivitis sicca, not specified as Sjogren's, bilateral: Secondary | ICD-10-CM | POA: Diagnosis not present

## 2023-12-11 DIAGNOSIS — H168 Other keratitis: Secondary | ICD-10-CM | POA: Diagnosis not present

## 2023-12-11 DIAGNOSIS — B0052 Herpesviral keratitis: Secondary | ICD-10-CM | POA: Diagnosis not present

## 2023-12-11 NOTE — Telephone Encounter (Signed)
-----   Message from Thersia Flax sent at 12/10/2023  8:06 PM EDT ----- Regarding: RE: Hospital follow up Thank you Daivd Dub for seeing her.  Your TOC was very thorough and I agree with keeping BP  around 120/70 given the chronic finding of an infrarenal aortic dissection, and I would  make a referral to vascular for an aortic ultrasound .looks like the hospitalist missed this? ----- Message ----- From: Calista Catching, FNP Sent: 12/10/2023   3:16 PM EDT To: Thersia Flax, MD Subject: Hospital follow up                             TT,   Would you mind reviewing my hospital follow up for your patient?   She has f/u with me 12/17/23  Primary was concerned with   1- low end blood pressure, I am slowly adding back telmisartan   2- dosing of torsemide  in setting of grade 2 diastolic dysfunction, severe PAH, moderate left pleural effusion, and BLE.  Her Crt had creeped up so I decreased torsemide  until labs, CXR returned.   3- MR liver shows   Severe aortic atherosclerosis. 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  I wasn't sure what a focal dissection of aorta- does this warrant vascular? I didn't want to overwhelm her  Pending GI consult and Neuro currently  THANK YOU, always.

## 2023-12-11 NOTE — Telephone Encounter (Signed)
 Call pt  Advise pt and daughter that I reviewed hospital follow up with dr Madelon Scheuermann  We agreed keeping BP 120/70   We reviewed MR liver which also showed infrarenal aortic dissection   Isolated infrarenal aortic dissection is an uncommon vascular disease that is related to hypertension, hyperlipidemia, and atherosclerosis and may be associated with infrarenal abdominal aneursym formation.  Dr Madelon Scheuermann and I agreed that a vascular consult is warranted  I have placed

## 2023-12-12 ENCOUNTER — Ambulatory Visit: Payer: Self-pay | Admitting: Family

## 2023-12-12 ENCOUNTER — Telehealth: Payer: Self-pay

## 2023-12-12 DIAGNOSIS — E119 Type 2 diabetes mellitus without complications: Secondary | ICD-10-CM | POA: Diagnosis not present

## 2023-12-12 DIAGNOSIS — E039 Hypothyroidism, unspecified: Secondary | ICD-10-CM | POA: Diagnosis not present

## 2023-12-12 DIAGNOSIS — I69354 Hemiplegia and hemiparesis following cerebral infarction affecting left non-dominant side: Secondary | ICD-10-CM | POA: Diagnosis not present

## 2023-12-12 DIAGNOSIS — R8281 Pyuria: Secondary | ICD-10-CM

## 2023-12-12 DIAGNOSIS — K7469 Other cirrhosis of liver: Secondary | ICD-10-CM

## 2023-12-12 DIAGNOSIS — J449 Chronic obstructive pulmonary disease, unspecified: Secondary | ICD-10-CM | POA: Diagnosis not present

## 2023-12-12 DIAGNOSIS — K760 Fatty (change of) liver, not elsewhere classified: Secondary | ICD-10-CM | POA: Diagnosis not present

## 2023-12-12 DIAGNOSIS — I10 Essential (primary) hypertension: Secondary | ICD-10-CM | POA: Diagnosis not present

## 2023-12-12 DIAGNOSIS — Z09 Encounter for follow-up examination after completed treatment for conditions other than malignant neoplasm: Secondary | ICD-10-CM

## 2023-12-12 NOTE — Telephone Encounter (Signed)
 Pt may get neb machine from our office Inform pt that I am not sure of what cost will be but insurance may pick up some of the cost. I am not sure

## 2023-12-12 NOTE — Telephone Encounter (Signed)
 Spoke to pt Daughter and went over notes below she verbalized understanding

## 2023-12-12 NOTE — Telephone Encounter (Signed)
 Copied from CRM [id number]. Topic: Clinical - Order For Equipment >> Dec 12, 2023  2:47 PM Trula Gable C wrote: Reason for CRM: Patient called in stated she is suppose to be on a nebulizer but doesn't have the machine or the equipment, needs Dr.Tullo to write a prescription for her to get it

## 2023-12-12 NOTE — Telephone Encounter (Signed)
 Spoke with pt's daughter and she stated that they picked up nebulizer solution from the pharmacy yesterday but they do not have a nebulizer machine. Would it be okay for the daughter to come by the office tomorrow to pick up one of the ones we have here in the office?    Pt is not having any current symptoms that she needs to use it for right now but they would like to have one on hand in case.

## 2023-12-12 NOTE — Telephone Encounter (Signed)
 Placed up front for pt's daughter to pick up tomorrow.

## 2023-12-13 MED ORDER — SPIRONOLACTONE 25 MG PO TABS: 12.5000 mg | ORAL_TABLET | Freq: Every day | ORAL | 2 refills | Status: DC

## 2023-12-13 MED ORDER — TORSEMIDE 20 MG PO TABS: 20.0000 mg | ORAL_TABLET | Freq: Every day | ORAL | 2 refills | Status: DC

## 2023-12-17 DIAGNOSIS — I69354 Hemiplegia and hemiparesis following cerebral infarction affecting left non-dominant side: Secondary | ICD-10-CM | POA: Diagnosis not present

## 2023-12-17 DIAGNOSIS — J449 Chronic obstructive pulmonary disease, unspecified: Secondary | ICD-10-CM | POA: Diagnosis not present

## 2023-12-17 DIAGNOSIS — E039 Hypothyroidism, unspecified: Secondary | ICD-10-CM | POA: Diagnosis not present

## 2023-12-17 DIAGNOSIS — E119 Type 2 diabetes mellitus without complications: Secondary | ICD-10-CM | POA: Diagnosis not present

## 2023-12-17 DIAGNOSIS — K760 Fatty (change of) liver, not elsewhere classified: Secondary | ICD-10-CM | POA: Diagnosis not present

## 2023-12-17 DIAGNOSIS — I10 Essential (primary) hypertension: Secondary | ICD-10-CM | POA: Diagnosis not present

## 2023-12-18 DIAGNOSIS — I10 Essential (primary) hypertension: Secondary | ICD-10-CM | POA: Diagnosis not present

## 2023-12-18 DIAGNOSIS — J449 Chronic obstructive pulmonary disease, unspecified: Secondary | ICD-10-CM | POA: Diagnosis not present

## 2023-12-18 DIAGNOSIS — I69354 Hemiplegia and hemiparesis following cerebral infarction affecting left non-dominant side: Secondary | ICD-10-CM | POA: Diagnosis not present

## 2023-12-18 DIAGNOSIS — K760 Fatty (change of) liver, not elsewhere classified: Secondary | ICD-10-CM | POA: Diagnosis not present

## 2023-12-18 DIAGNOSIS — E119 Type 2 diabetes mellitus without complications: Secondary | ICD-10-CM | POA: Diagnosis not present

## 2023-12-18 DIAGNOSIS — E039 Hypothyroidism, unspecified: Secondary | ICD-10-CM | POA: Diagnosis not present

## 2023-12-19 ENCOUNTER — Ambulatory Visit: Admitting: Family

## 2023-12-19 ENCOUNTER — Ambulatory Visit: Admitting: Internal Medicine

## 2023-12-19 ENCOUNTER — Encounter: Payer: Self-pay | Admitting: Internal Medicine

## 2023-12-19 VITALS — BP 112/56 | HR 66 | Ht 62.0 in | Wt 152.0 lb

## 2023-12-19 DIAGNOSIS — I672 Cerebral atherosclerosis: Secondary | ICD-10-CM

## 2023-12-19 DIAGNOSIS — K7460 Unspecified cirrhosis of liver: Secondary | ICD-10-CM | POA: Diagnosis not present

## 2023-12-19 DIAGNOSIS — D329 Benign neoplasm of meninges, unspecified: Secondary | ICD-10-CM

## 2023-12-19 DIAGNOSIS — R748 Abnormal levels of other serum enzymes: Secondary | ICD-10-CM | POA: Diagnosis not present

## 2023-12-19 DIAGNOSIS — R188 Other ascites: Secondary | ICD-10-CM | POA: Diagnosis not present

## 2023-12-19 DIAGNOSIS — Z7189 Other specified counseling: Secondary | ICD-10-CM

## 2023-12-19 DIAGNOSIS — E119 Type 2 diabetes mellitus without complications: Secondary | ICD-10-CM | POA: Diagnosis not present

## 2023-12-19 DIAGNOSIS — K7469 Other cirrhosis of liver: Secondary | ICD-10-CM | POA: Diagnosis not present

## 2023-12-19 DIAGNOSIS — E1169 Type 2 diabetes mellitus with other specified complication: Secondary | ICD-10-CM | POA: Diagnosis not present

## 2023-12-19 DIAGNOSIS — E1159 Type 2 diabetes mellitus with other circulatory complications: Secondary | ICD-10-CM

## 2023-12-19 DIAGNOSIS — R8281 Pyuria: Secondary | ICD-10-CM | POA: Diagnosis not present

## 2023-12-19 DIAGNOSIS — I152 Hypertension secondary to endocrine disorders: Secondary | ICD-10-CM

## 2023-12-19 DIAGNOSIS — E669 Obesity, unspecified: Secondary | ICD-10-CM

## 2023-12-19 DIAGNOSIS — Z8673 Personal history of transient ischemic attack (TIA), and cerebral infarction without residual deficits: Secondary | ICD-10-CM

## 2023-12-19 DIAGNOSIS — I7102 Dissection of abdominal aorta: Secondary | ICD-10-CM

## 2023-12-19 LAB — COMPREHENSIVE METABOLIC PANEL WITH GFR
ALT: 66 U/L — ABNORMAL HIGH (ref 0–35)
AST: 75 U/L — ABNORMAL HIGH (ref 0–37)
Albumin: 3.6 g/dL (ref 3.5–5.2)
Alkaline Phosphatase: 582 U/L — ABNORMAL HIGH (ref 39–117)
BUN: 22 mg/dL (ref 6–23)
CO2: 26 meq/L (ref 19–32)
Calcium: 9.8 mg/dL (ref 8.4–10.5)
Chloride: 102 meq/L (ref 96–112)
Creatinine, Ser: 0.91 mg/dL (ref 0.40–1.20)
GFR: 55.94 mL/min — ABNORMAL LOW (ref >60.00)
Glucose, Bld: 90 mg/dL (ref 70–99)
Potassium: 4.6 meq/L (ref 3.5–5.1)
Sodium: 134 meq/L — ABNORMAL LOW (ref 135–145)
Total Bilirubin: 1.1 mg/dL (ref 0.2–1.2)
Total Protein: 7.4 g/dL (ref 6.0–8.3)

## 2023-12-19 LAB — URINALYSIS, ROUTINE W REFLEX MICROSCOPIC
Bilirubin Urine: NEGATIVE
Hgb urine dipstick: NEGATIVE
Ketones, ur: NEGATIVE
Nitrite: POSITIVE — AB
RBC / HPF: NONE SEEN (ref 0–?)
Specific Gravity, Urine: 1.015 (ref 1.000–1.030)
Total Protein, Urine: NEGATIVE
Urine Glucose: NEGATIVE
Urobilinogen, UA: 0.2 (ref 0.0–1.0)
pH: 6 (ref 5.0–8.0)

## 2023-12-19 LAB — HEMOGLOBIN A1C: Hgb A1c MFr Bld: 6 % (ref 4.6–6.5)

## 2023-12-19 MED ORDER — SPIRONOLACTONE 25 MG PO TABS: 25.0000 mg | ORAL_TABLET | Freq: Every day | ORAL | 1 refills | Status: DC

## 2023-12-19 NOTE — Patient Instructions (Addendum)
 Continue taking a full tablet of spironolactone  (in the morning) .  Continue  torsemide   twice  a week (always in the morning)  Take an extra torsemide  if you have a weight gain of 2 lb overnight  Torsemide  is the same medication as DEMADEX 

## 2023-12-19 NOTE — Progress Notes (Signed)
 Subjective:  Patient ID: Lauren Morris, female    DOB: [birth year]  Age: 89 y.o. MRN: [id number]  CC: The primary encounter diagnosis was Cirrhosis of liver with ascites, unspecified hepatic cirrhosis type (HCC). Diagnoses of Pyuria, Other cirrhosis of liver (HCC), DNR (do not resuscitate) discussion, Type 2 diabetes mellitus without complication, without long-term current use of insulin  (HCC), Meningioma (HCC), Cerebrovascular disease, arteriosclerotic, post-stroke, Aortic dissection, abdominal (HCC), Obesity, diabetes, and hypertension syndrome (HCC), and Elevated liver enzymes were also pertinent to this visit.   HPI Lauren Morris presents for  Chief Complaint  Patient presents with   Medical Management of Chronic Issues   Lauren Morris is an 89 yr old female  with a history of NASH/cirrhosis,  type 2 DM and hypertension who is here for follow up on decompensated cirrhosis following an admission to [facility] in April for sepsis secondary to UTI  complicated by hypoxic respiratory failure. 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  Repeat labs noted worsening transaminitis and rising alk phos with normal T Bili.    Blood pressure meds were stopped at discharge and BP has been normal .   At HFU last week,  telmisartan  was resumed at lower dose.  daily torsemide  dose was reduced to 20 mg ,  and spironolactone  added   Has not been taking torsemide  daily  because of excessive urination.  Last dose was 4 days ago.  Taking spironolactone  daily .  She feels 'ok, some days has more energy than others.  Denies orthopnea,  lower extremity edema, fevers, and abdominal pain.  Appetite diminished   DNR /MOST orders discussed.  She does not want resuscitation by CPR and requests a DNR order.   Lab Results  Component Value Date   HGBA1C 6.0 12/19/2023      Outpatient Medications Prior to Visit  Medication Sig Dispense Refill   albuterol  (PROVENTIL ) (2.5 MG/3ML) 0.083% nebulizer solution Take 3 mLs (2.5 mg total) by nebulization every 6 (six) hours as needed for wheezing or shortness of breath. 75 mL 12   ANORO ELLIPTA  62.5-25 MCG/ACT AEPB TAKE 1 PUFF BY MOUTH EVERY DAY 60 each 5   Ascorbic Acid  (VITAMIN C ) 1000 MG tablet Take 1 tablet (1,000 mg total) by mouth daily. 90 tablet 3   Aspirin  81 MG CAPS Take 1 capsule by mouth daily at 6 (six) AM.     atenolol  (TENORMIN ) 25 MG tablet Take 1 tablet (25 mg total) by mouth 2 (two) times daily. 180 tablet 1   atorvastatin  (LIPITOR) 40 MG tablet Take 1 tablet (40 mg total) by mouth daily.     Calcium  Carb-Cholecalciferol (CALCIUM  1000 + D PO) Take 1 tablet by mouth daily.     Cenegermin-bkbj (OXERVATE) 0.002 % SOLN Apply 1 drop to eye 6 (six) times daily.     famotidine  (PEPCID ) 20 MG tablet Take 1 tablet (20 mg total) by mouth at bedtime.     fish oil-omega-3 fatty acids  1000 MG capsule Take 2 capsules (2 g total) by mouth daily. 6 capsule 11   Multiple Vitamin (MULTIVITAMIN WITH MINERALS) TABS tablet Take 1 tablet by mouth daily. 30 tablet 0   RESTASIS  0.05 % ophthalmic emulsion Place 1 drop into both eyes 2 (two) times daily.     telmisartan  (MICARDIS ) 20 MG  tablet Take 0.5 tablets (10 mg total) by mouth daily. 30 tablet 2   timolol  (TIMOPTIC ) 0.5 % ophthalmic solution SMARTSIG:In Eye(s)     torsemide  (DEMADEX ) 20 MG tablet Take 1 tablet (20 mg total) by mouth daily. 30 tablet 2   valACYclovir  (VALTREX ) 500 MG tablet Take 500 mg by mouth at bedtime. (Patient taking differently: Take 500 mg by mouth 2 (two) times daily.)     Zinc  Oxide (TRIPLE PASTE) 12.8 % ointment Apply 1 Application topically. Every shift     zinc  sulfate 220 (50 Zn) MG capsule Take 1 capsule (220 mg total) by mouth daily. 30 capsule 11   spironolactone  (ALDACTONE ) 25 MG tablet Take 0.5 tablets (12.5 mg total) by mouth daily.  30 tablet 2   Difluprednate 0.05 % EMUL Place 1 drop into the left eye 2 (two) times daily. (Patient not taking: Reported on 12/19/2023)     prednisoLONE  acetate (PREDNISOLONE  ACETATE P-F) 1 % ophthalmic suspension Place 2 drops into the left eye 2 (two) times daily.     No facility-administered medications prior to visit.    Review of Systems;  Patient denies headache, fevers, malaise, unintentional weight loss, skin rash, eye pain, sinus congestion and sinus pain, sore throat, dysphagia,  hemoptysis , cough, dyspnea, wheezing, chest pain, palpitations, orthopnea, edema, abdominal pain, nausea, melena, diarrhea, constipation, flank pain, dysuria, hematuria, urinary  Frequency, nocturia, numbness, tingling, seizures,  Focal weakness, Loss of consciousness,  Tremor, insomnia, depression, anxiety, and suicidal ideation.      Objective:  BP (!) 112/56   Pulse 66   Ht 5' 2 (1.575 m)   Wt 152 lb (68.9 kg)   SpO2 96%   BMI 27.80 kg/m   BP Readings from Last 3 Encounters:  12/19/23 (!) 112/56  12/10/23 118/70  10/22/23 (!) 152/59    Wt Readings from Last 3 Encounters:  12/19/23 152 lb (68.9 kg)  12/10/23 153 lb 12.8 oz (69.8 kg)  10/14/23 167 lb 14.4 oz (76.2 kg)    Physical Exam Vitals reviewed.  Constitutional:      General: She is not in acute distress.    Appearance: Normal appearance. She is normal weight. She is not ill-appearing, toxic-appearing or diaphoretic.  HENT:     Head: Normocephalic.   Eyes:     General: No scleral icterus.       Right eye: No discharge.        Left eye: No discharge.     Conjunctiva/sclera: Conjunctivae normal.    Cardiovascular:     Rate and Rhythm: Normal rate and regular rhythm.     Heart sounds: Normal heart sounds.  Pulmonary:     Effort: Pulmonary effort is normal. No respiratory distress.     Breath sounds: Normal breath sounds.   Musculoskeletal:        General: Normal range of motion.   Skin:    General: Skin is warm and  dry.   Neurological:     General: No focal deficit present.     Mental Status: She is alert and oriented to person, place, and time. Mental status is at baseline.   Psychiatric:        Mood and Affect: Mood normal.        Behavior: Behavior normal.        Thought Content: Thought content normal.        Judgment: Judgment normal.    Lab Results  Component Value Date   HGBA1C 6.0 12/19/2023  HGBA1C 5.9 09/10/2023   HGBA1C 6.0 05/07/2022    Lab Results  Component Value Date   CREATININE 0.91 12/19/2023   CREATININE 0.75 12/10/2023   CREATININE 0.85 10/22/2023    Lab Results  Component Value Date   WBC 9.1 10/22/2023   HGB 14.3 10/22/2023   HCT 42.5 10/22/2023   PLT 218 10/22/2023   GLUCOSE 90 12/19/2023   CHOL 183 09/10/2023   TRIG 104.0 09/10/2023   HDL 49.40 09/10/2023   LDLDIRECT 105.0 09/10/2023   LDLCALC 113 (H) 09/10/2023   ALT 66 (H) 12/19/2023   AST 75 (H) 12/19/2023   NA 134 (L) 12/19/2023   K 4.6 12/19/2023   CL 102 12/19/2023   CREATININE 0.91 12/19/2023   BUN 22 12/19/2023   CO2 26 12/19/2023   TSH 3.05 09/10/2023   INR 1.2 10/14/2023   HGBA1C 6.0 12/19/2023   MICROALBUR 1.4 09/10/2023    MR LIVER W WO CONTRAST Result Date: 10/17/2023 CLINICAL DATA:  Cirrhosis EXAM: MRI ABDOMEN WITHOUT AND WITH CONTRAST TECHNIQUE: Multiplanar multisequence MR imaging of the abdomen was performed both before and after the administration of intravenous contrast. CONTRAST:  7mL GADAVIST  GADOBUTROL  1 MMOL/ML IV SOLN COMPARISON:  Right upper quadrant ultrasound, 10/14/2023, CT abdomen pelvis, 08/02/2021 FINDINGS: Lower chest: Cardiomegaly.  Moderate bilateral pleural effusions. Hepatobiliary: Coarse, nodular cirrhotic morphology of the liver. No solid liver abnormality is seen. Multiple small gallstones. Mildly distended gallbladder with trace pericholecystic fluid, nonspecific in the setting of ascites. No significant gallbladder wall thickening. No biliary ductal  dilatation nor evidence of choledocholithiasis. Pancreas: Unremarkable. No pancreatic ductal dilatation or surrounding inflammatory changes. Spleen: Mild splenomegaly, maximum coronal span 13.4 cm. Adrenals/Urinary Tract: Adrenal glands are unremarkable. Kidneys are normal, without renal calculi, solid lesion, or hydronephrosis. Stomach/Bowel: Stomach is within normal limits. No evidence of bowel wall thickening, distention, or inflammatory changes. Vascular/Lymphatic: Severe aortic atherosclerosis. 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 No enlarged abdominal lymph nodes. Other: No abdominal wall hernia.  Anasarca.  Trace ascites. Musculoskeletal: No acute or significant osseous findings. IMPRESSION: 1. Cirrhosis. No focal liver lesions. Mild splenomegaly and trace ascites. 2. Cholelithiasis. Mildly distended gallbladder with trace pericholecystic fluid, nonspecific in the setting of ascites. No significant gallbladder wall thickening. No biliary ductal dilatation nor evidence of choledocholithiasis. 3. Severe aortic atherosclerosis. 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 4. Moderate bilateral pleural effusions. Aortic Atherosclerosis (ICD10-I70.0). Electronically Signed   By: Marolyn JONETTA Jaksch M.D.   On: 10/17/2023 06:05   ECHOCARDIOGRAM COMPLETE Result Date: 10/16/2023    ECHOCARDIOGRAM REPORT   Patient Name:   Lauren Morris Date of Exam: 10/15/2023 Medical Rec #:  [id number]   Height:       62.0 in Accession #:    [id number]  Weight:       167.9 lb Date of Birth:  06-12-1932  BSA:          1.775 m Patient Age:    89 years    BP:           100/53 mmHg Patient Gender: F           HR:           79 bpm. Exam Location:  [facility] Procedure: 2D Echo, Cardiac Doppler and Color Doppler (Both Spectral and Color  Flow Doppler were utilized during procedure). Indications:     I63.9 Stroke  History:         Patient has prior history of Echocardiogram examinations, most                  recent 08/04/2021. Risk Factors:Hypertension and Sleep Apnea.  Sonographer:     Carl Coma RDCS Referring Phys:  [id number] MCNEILL P KIRKPATRICK Diagnosing Phys: Evalene Lunger MD IMPRESSIONS  1. Left ventricular ejection fraction, by estimation, is 60 to 65%. The left ventricle has normal function. The left ventricle has no regional wall motion abnormalities. Left ventricular diastolic parameters are consistent with Grade II diastolic dysfunction (pseudonormalization).  2. Right ventricular systolic function is normal. The right ventricular size is normal. There is severely elevated pulmonary artery systolic pressure. The estimated right ventricular systolic pressure is 72.2 mmHg.  3. Moderate pleural effusion in the left lateral region.  4. The mitral valve is normal in structure. Mild mitral valve regurgitation. No evidence of mitral stenosis.  5. The aortic valve is normal in structure. There is mild calcification of the aortic valve. Aortic valve regurgitation is not visualized. Aortic valve sclerosis is present, with no evidence of aortic valve stenosis.  6. The inferior vena cava is normal in size with greater than 50% respiratory variability, suggesting right atrial pressure of 3 mmHg. FINDINGS  Left Ventricle: Left ventricular ejection fraction, by estimation, is 60 to 65%. The left ventricle has normal function. The left ventricle has no regional wall motion abnormalities. Strain was performed and the global longitudinal strain is indeterminate. The left ventricular internal cavity size was normal in size. There is no left ventricular hypertrophy. Left ventricular diastolic parameters are consistent with Grade II diastolic dysfunction (pseudonormalization). Right Ventricle: The right ventricular size is normal. No  increase in right ventricular wall thickness. Right ventricular systolic function is normal. There is severely elevated pulmonary artery systolic pressure. The tricuspid regurgitant velocity is 4.10 m/s, and with an assumed right atrial pressure of 5 mmHg, the estimated right ventricular systolic pressure is 72.2 mmHg. Left Atrium: Left atrial size was normal in size. Right Atrium: Right atrial size was normal in size. Pericardium: There is no evidence of pericardial effusion. Mitral Valve: The mitral valve is normal in structure. Mild mitral annular calcification. Mild mitral valve regurgitation. No evidence of mitral valve stenosis. Tricuspid Valve: The tricuspid valve is normal in structure. Tricuspid valve regurgitation is mild . No evidence of tricuspid stenosis. The aortic valve is normal in structure. There is mild calcification of the aortic valve. Aortic valve regurgitation is not visualized. Aortic valve sclerosis is present, with no evidence of aortic valve stenosis. Pulmonic Valve: The pulmonic valve was normal in structure. Pulmonic valve regurgitation is not visualized. No evidence of pulmonic stenosis. Aorta: The aortic root is normal in size and structure. Venous: The inferior vena cava is normal in size with greater than 50% respiratory variability, suggesting right atrial pressure of 3 mmHg. IAS/Shunts: No atrial level shunt detected by color flow Doppler. Additional Comments: 3D was performed not requiring image post processing on an independent workstation and was indeterminate. There is a moderate pleural effusion in the left lateral region.  LEFT VENTRICLE PLAX 2D LVIDd:         3.60 cm   Diastology LVIDs:         2.00 cm   LV e' medial:    5.50 cm/s LV PW:         1.10 cm  LV E/e' medial:  20.5 LV IVS:        0.80 cm   LV e' lateral:   5.55 cm/s LVOT diam:     1.90 cm   LV E/e' lateral: 20.3 LV SV:         69 LV SV Index:   39 LVOT Area:     2.84 cm  RIGHT VENTRICLE            IVC RV Basal  diam:  4.00 cm    IVC diam: 1.20 cm RV S prime:     9.79 cm/s TAPSE (M-mode): 1.8 cm LEFT ATRIUM             Index        RIGHT ATRIUM           Index LA diam:        4.00 cm 2.25 cm/m   RA Area:     12.70 cm LA Vol (A2C):   46.4 ml 26.15 ml/m  RA Volume:   30.40 ml  17.13 ml/m LA Vol (A4C):   47.7 ml 26.88 ml/m LA Biplane Vol: 47.3 ml 26.65 ml/m  AORTIC VALVE AV Area (Vmax):    1.84 cm AV Area (Vmean):   1.77 cm AV Area (VTI):     1.92 cm AV Vmax:           190.67 cm/s AV Vmean:          132.667 cm/s AV VTI:            0.361 m AV Peak Grad:      14.5 mmHg AV Mean Grad:      8.3 mmHg LVOT Vmax:         123.50 cm/s LVOT Vmean:        83.050 cm/s LVOT VTI:          0.244 m LVOT/AV VTI ratio: 0.68  AORTA Ao Root diam: 2.70 cm Ao Asc diam:  3.10 cm MITRAL VALVE                TRICUSPID VALVE MV Area (PHT): 3.50 cm     TR Peak grad:   67.2 mmHg MV Decel Time: 217 msec     TR Vmax:        410.00 cm/s MV E velocity: 112.50 cm/s MV A velocity: 100.50 cm/s  SHUNTS MV E/A ratio:  1.12         Systemic VTI:  0.24 m                             Systemic Diam: 1.90 cm Evalene Lunger MD Electronically signed by Evalene Lunger MD Signature Date/Time: 10/16/2023/7:15:03 AM    Final    DG Chest Port 1 View Result Date: 10/15/2023 CLINICAL DATA:  Dyspnea EXAM: PORTABLE CHEST 1 VIEW COMPARISON:  October 18, 2023 FINDINGS: Bilateral reticular interstitial infiltrates with a small bilateral pleural effusions left more than right. Left lower lobe atelectasis. Heart and mediastinum normal Findings could correlate with congestive changes, pneumonia not excluded. IMPRESSION: Bilateral reticular interstitial infiltrates with a small bilateral pleural effusions left more than right. Left lower lobe atelectasis. Findings could correlate with congestive changes, pneumonia not excluded. Electronically Signed   By: Franky Chard M.D.   On: 10/15/2023 14:25   MR BRAIN WO CONTRAST Result Date: 10/15/2023 CLINICAL DATA:  Altered mental  status EXAM: MRI HEAD WITHOUT CONTRAST TECHNIQUE: Multiplanar, multiecho pulse sequences  of the brain and surrounding structures were obtained without intravenous contrast. COMPARISON:  08/02/2021 FINDINGS: Brain: Right basal ganglia acute infarct. No acute or chronic hemorrhage. There is multifocal hyperintense T2-weighted signal within the white matter. Parenchymal volume and CSF spaces are normal. The midline structures are normal. Unchanged right posterior fossa of meningioma. Vascular: Normal flow voids. Skull and upper cervical spine: Normal calvarium and skull base. Visualized upper cervical spine and soft tissues are normal. Sinuses/Orbits:No paranasal sinus fluid levels or advanced mucosal thickening. No mastoid or middle ear effusion. Normal orbits. IMPRESSION: 1. Right basal ganglia acute infarct. No hemorrhage or mass effect. 2. Unchanged right posterior fossa meningioma. Electronically Signed   By: Franky Stanford M.D.   On: 10/15/2023 03:30    Assessment & Plan:  .Cirrhosis of liver with ascites, unspecified hepatic cirrhosis type (HCC) -     Comprehensive metabolic panel with GFR  Pyuria -     Urine Culture -     Urinalysis, Routine w reflex microscopic  Other cirrhosis of liver (HCC) -     Spironolactone ; Take 1 tablet (25 mg total) by mouth daily.  Dispense: 90 tablet; Refill: 1  DNR (do not resuscitate) discussion Assessment & Plan: She desires a natural death ;  DNR order signed.   Orders: -     Do not attempt resuscitation (DNR)  Type 2 diabetes mellitus without complication, without long-term current use of insulin  (HCC) -     Hemoglobin A1c  Meningioma (HCC) Assessment & Plan: Incidental finding during workup for acute dizziness.  Neurology follow up was postponed by hospital admission and will be rescheduled with provider in [place]   Cerebrovascular disease, arteriosclerotic, post-stroke Assessment & Plan: She completed a 6 week rehab stay at [facility] with  improvement noted in left arm /leg weakness .  She requires some assistance at home to prevent falls;  her daughter is staying with her at night.  The home has been equipped with safety features to prevent bathroom falls    Aortic dissection, abdominal (HCC) Assessment & Plan: She has Severe aortic atherosclerosis secondary to diabetes and statin avoidance. . 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 Has not been addressed yet.  Vascular referral recommended. Continue statin and aggressive control of blood pressure    Obesity, diabetes, and hypertension syndrome (HCC) Assessment & Plan: REMAINS well-controlled on diet alone .  hemoglobin A1c has been consistently at or  less than 7.0 . Patient is up-to-date on eye exams and foot exam is normal today. Patient has  INCREASING microalbuminuria., and   UaCR is  increasing. She was tolerating an increase telmisartan  to 80 mg daily until her admisson in April for sepsis but pressure has been soft dince discharge   continue telmisartan  20 mg daily for now. Historically patient has declined  statin therapy for CAD risk reduction but has been taking atorvastatin  since admission give evidence of recent CVA  Lab Results  Component Value Date   HGBA1C 6.0 12/19/2023   Lab Results  Component Value Date   MICROALBUR 1.4 09/10/2023         Elevated liver enzymes Assessment & Plan: Improving upon one week repeat.  She has known nonalcoholic cirrhosis , which become decompensated during recent admission and was also initiated on statin therapy due to right basal ganglia CVA       I spent 34 minutes on the day of this face  to face encounter reviewing patient's  most recent visit with cardiology,  nephrology,  and neurology,  prior relevant surgical and non surgical procedures, recent  labs and imaging studies, counseling on weight management,  reviewing  the assessment and plan with patient, and post visit ordering and reviewing of  diagnostics and therapeutics with patient  .   Follow-up: Return in about 3 months (around 03/20/2024).   Verneita LITTIE Kettering, MD

## 2023-12-20 ENCOUNTER — Ambulatory Visit: Payer: Self-pay | Admitting: Internal Medicine

## 2023-12-20 DIAGNOSIS — J449 Chronic obstructive pulmonary disease, unspecified: Secondary | ICD-10-CM | POA: Diagnosis not present

## 2023-12-20 DIAGNOSIS — K760 Fatty (change of) liver, not elsewhere classified: Secondary | ICD-10-CM | POA: Diagnosis not present

## 2023-12-20 DIAGNOSIS — E039 Hypothyroidism, unspecified: Secondary | ICD-10-CM | POA: Diagnosis not present

## 2023-12-20 DIAGNOSIS — I69354 Hemiplegia and hemiparesis following cerebral infarction affecting left non-dominant side: Secondary | ICD-10-CM | POA: Diagnosis not present

## 2023-12-20 DIAGNOSIS — E119 Type 2 diabetes mellitus without complications: Secondary | ICD-10-CM | POA: Diagnosis not present

## 2023-12-20 DIAGNOSIS — I10 Essential (primary) hypertension: Secondary | ICD-10-CM | POA: Diagnosis not present

## 2023-12-21 LAB — URINE CULTURE
MICRO NUMBER:: 16629240
SPECIMEN QUALITY:: ADEQUATE

## 2023-12-22 ENCOUNTER — Encounter: Payer: Self-pay | Admitting: Internal Medicine

## 2023-12-22 MED ORDER — CIPROFLOXACIN HCL 250 MG PO TABS: 250.0000 mg | ORAL_TABLET | Freq: Two times a day (BID) | ORAL | 0 refills | Status: AC

## 2023-12-22 NOTE — Assessment & Plan Note (Signed)
 REMAINS well-controlled on diet alone .  hemoglobin A1c has been consistently at or  less than 7.0 . Patient is up-to-date on eye exams and foot exam is normal today. Patient has  INCREASING microalbuminuria., and   UaCR is  increasing. She was tolerating an increase telmisartan  to 80 mg daily until her admisson in April for sepsis but pressure has been soft dince discharge   continue telmisartan  20 mg daily for now. Historically patient has declined  statin therapy for CAD risk reduction but has been taking atorvastatin  since admission give evidence of recent CVA  Lab Results  Component Value Date   HGBA1C 6.0 12/19/2023   Lab Results  Component Value Date   MICROALBUR 1.4 09/10/2023

## 2023-12-22 NOTE — Assessment & Plan Note (Signed)
Incidental finding during workup for acute dizziness.  Neurology follow up was postponed by hospital admission and will be rescheduled with provider in [place]

## 2023-12-22 NOTE — Assessment & Plan Note (Addendum)
 She has Severe aortic atherosclerosis secondary to diabetes and statin avoidance. . 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 Has not been addressed yet.  Vascular referral recommended. Continue statin and aggressive control of blood pressure

## 2023-12-22 NOTE — Assessment & Plan Note (Signed)
 She completed a 6 week rehab stay at [facility] with improvement noted in left arm /leg weakness .  She requires some assistance at home to prevent falls;  her daughter is staying with her at night.  The home has been equipped with safety features to prevent bathroom falls

## 2023-12-22 NOTE — Assessment & Plan Note (Signed)
 She desires a natural death ;  DNR order signed.

## 2023-12-22 NOTE — Assessment & Plan Note (Signed)
 Improving upon one week repeat.  She has known nonalcoholic cirrhosis , which become decompensated during recent admission and was also initiated on statin therapy due to right basal ganglia CVA

## 2023-12-23 DIAGNOSIS — K760 Fatty (change of) liver, not elsewhere classified: Secondary | ICD-10-CM | POA: Diagnosis not present

## 2023-12-23 DIAGNOSIS — E039 Hypothyroidism, unspecified: Secondary | ICD-10-CM | POA: Diagnosis not present

## 2023-12-23 DIAGNOSIS — I69354 Hemiplegia and hemiparesis following cerebral infarction affecting left non-dominant side: Secondary | ICD-10-CM | POA: Diagnosis not present

## 2023-12-23 DIAGNOSIS — I10 Essential (primary) hypertension: Secondary | ICD-10-CM | POA: Diagnosis not present

## 2023-12-23 DIAGNOSIS — E119 Type 2 diabetes mellitus without complications: Secondary | ICD-10-CM | POA: Diagnosis not present

## 2023-12-23 DIAGNOSIS — J449 Chronic obstructive pulmonary disease, unspecified: Secondary | ICD-10-CM | POA: Diagnosis not present

## 2023-12-24 DIAGNOSIS — E119 Type 2 diabetes mellitus without complications: Secondary | ICD-10-CM | POA: Diagnosis not present

## 2023-12-24 DIAGNOSIS — E039 Hypothyroidism, unspecified: Secondary | ICD-10-CM | POA: Diagnosis not present

## 2023-12-24 DIAGNOSIS — K760 Fatty (change of) liver, not elsewhere classified: Secondary | ICD-10-CM | POA: Diagnosis not present

## 2023-12-24 DIAGNOSIS — J449 Chronic obstructive pulmonary disease, unspecified: Secondary | ICD-10-CM | POA: Diagnosis not present

## 2023-12-24 DIAGNOSIS — I69354 Hemiplegia and hemiparesis following cerebral infarction affecting left non-dominant side: Secondary | ICD-10-CM | POA: Diagnosis not present

## 2023-12-24 DIAGNOSIS — I10 Essential (primary) hypertension: Secondary | ICD-10-CM | POA: Diagnosis not present

## 2023-12-25 ENCOUNTER — Telehealth: Payer: Self-pay

## 2023-12-25 NOTE — Telephone Encounter (Signed)
 Copied from CRM [id number]. Topic: Medical Record Request - Records Request >> Dec 25, 2023  1:39 PM Burnard DEL wrote: Reason for CRM: Patients daughter [name] called in stating that she is needing medical records documentation from when she hospital on;  april21st hospital discharge notes.  And the office notes from 11/09/2022 visit Hospital notes from hospital visit on 08/08/2021 as well  She is needing these documentation for patients long term care policy ,where someone is coming to the home to  review her  needs to continue to have long term care and  to verify hat she needs the care,so that they can pay for it. The review is scheduled for tomorrow.Daughter would like to know if theres a way that she can pick up these notes today by 5pm?Please call daughter [name] to call once documents are ready to be picked .

## 2023-12-25 NOTE — Telephone Encounter (Signed)
 I have printed the requested records and placed up front for pick up in the accordion folder. Pt's daughter is aware that they are ready for pick up.

## 2023-12-26 DIAGNOSIS — J449 Chronic obstructive pulmonary disease, unspecified: Secondary | ICD-10-CM | POA: Diagnosis not present

## 2023-12-26 DIAGNOSIS — E119 Type 2 diabetes mellitus without complications: Secondary | ICD-10-CM | POA: Diagnosis not present

## 2023-12-26 DIAGNOSIS — I10 Essential (primary) hypertension: Secondary | ICD-10-CM | POA: Diagnosis not present

## 2023-12-26 DIAGNOSIS — E039 Hypothyroidism, unspecified: Secondary | ICD-10-CM | POA: Diagnosis not present

## 2023-12-26 DIAGNOSIS — K760 Fatty (change of) liver, not elsewhere classified: Secondary | ICD-10-CM | POA: Diagnosis not present

## 2023-12-26 DIAGNOSIS — I69354 Hemiplegia and hemiparesis following cerebral infarction affecting left non-dominant side: Secondary | ICD-10-CM | POA: Diagnosis not present

## 2023-12-31 DIAGNOSIS — E119 Type 2 diabetes mellitus without complications: Secondary | ICD-10-CM | POA: Diagnosis not present

## 2023-12-31 DIAGNOSIS — E039 Hypothyroidism, unspecified: Secondary | ICD-10-CM | POA: Diagnosis not present

## 2023-12-31 DIAGNOSIS — I69354 Hemiplegia and hemiparesis following cerebral infarction affecting left non-dominant side: Secondary | ICD-10-CM | POA: Diagnosis not present

## 2023-12-31 DIAGNOSIS — I10 Essential (primary) hypertension: Secondary | ICD-10-CM | POA: Diagnosis not present

## 2023-12-31 DIAGNOSIS — K760 Fatty (change of) liver, not elsewhere classified: Secondary | ICD-10-CM | POA: Diagnosis not present

## 2023-12-31 DIAGNOSIS — J449 Chronic obstructive pulmonary disease, unspecified: Secondary | ICD-10-CM | POA: Diagnosis not present

## 2024-01-01 DIAGNOSIS — E039 Hypothyroidism, unspecified: Secondary | ICD-10-CM | POA: Diagnosis not present

## 2024-01-01 DIAGNOSIS — I69354 Hemiplegia and hemiparesis following cerebral infarction affecting left non-dominant side: Secondary | ICD-10-CM | POA: Diagnosis not present

## 2024-01-01 DIAGNOSIS — K760 Fatty (change of) liver, not elsewhere classified: Secondary | ICD-10-CM | POA: Diagnosis not present

## 2024-01-01 DIAGNOSIS — E119 Type 2 diabetes mellitus without complications: Secondary | ICD-10-CM | POA: Diagnosis not present

## 2024-01-01 DIAGNOSIS — I10 Essential (primary) hypertension: Secondary | ICD-10-CM | POA: Diagnosis not present

## 2024-01-01 DIAGNOSIS — J449 Chronic obstructive pulmonary disease, unspecified: Secondary | ICD-10-CM | POA: Diagnosis not present

## 2024-01-04 ENCOUNTER — Other Ambulatory Visit: Payer: Self-pay | Admitting: Internal Medicine

## 2024-01-04 DIAGNOSIS — I10 Essential (primary) hypertension: Secondary | ICD-10-CM | POA: Diagnosis not present

## 2024-01-04 DIAGNOSIS — J449 Chronic obstructive pulmonary disease, unspecified: Secondary | ICD-10-CM | POA: Diagnosis not present

## 2024-01-04 DIAGNOSIS — E039 Hypothyroidism, unspecified: Secondary | ICD-10-CM | POA: Diagnosis not present

## 2024-01-04 DIAGNOSIS — I69354 Hemiplegia and hemiparesis following cerebral infarction affecting left non-dominant side: Secondary | ICD-10-CM | POA: Diagnosis not present

## 2024-01-04 DIAGNOSIS — G4733 Obstructive sleep apnea (adult) (pediatric): Secondary | ICD-10-CM | POA: Diagnosis not present

## 2024-01-04 DIAGNOSIS — K760 Fatty (change of) liver, not elsewhere classified: Secondary | ICD-10-CM | POA: Diagnosis not present

## 2024-01-04 DIAGNOSIS — E119 Type 2 diabetes mellitus without complications: Secondary | ICD-10-CM | POA: Diagnosis not present

## 2024-01-04 DIAGNOSIS — E785 Hyperlipidemia, unspecified: Secondary | ICD-10-CM | POA: Diagnosis not present

## 2024-01-07 DIAGNOSIS — E119 Type 2 diabetes mellitus without complications: Secondary | ICD-10-CM | POA: Diagnosis not present

## 2024-01-07 DIAGNOSIS — I10 Essential (primary) hypertension: Secondary | ICD-10-CM | POA: Diagnosis not present

## 2024-01-07 DIAGNOSIS — I69354 Hemiplegia and hemiparesis following cerebral infarction affecting left non-dominant side: Secondary | ICD-10-CM | POA: Diagnosis not present

## 2024-01-07 DIAGNOSIS — K760 Fatty (change of) liver, not elsewhere classified: Secondary | ICD-10-CM | POA: Diagnosis not present

## 2024-01-07 DIAGNOSIS — J449 Chronic obstructive pulmonary disease, unspecified: Secondary | ICD-10-CM | POA: Diagnosis not present

## 2024-01-07 DIAGNOSIS — E039 Hypothyroidism, unspecified: Secondary | ICD-10-CM | POA: Diagnosis not present

## 2024-01-08 DIAGNOSIS — Z8673 Personal history of transient ischemic attack (TIA), and cerebral infarction without residual deficits: Secondary | ICD-10-CM | POA: Diagnosis not present

## 2024-01-08 DIAGNOSIS — Z09 Encounter for follow-up examination after completed treatment for conditions other than malignant neoplasm: Secondary | ICD-10-CM | POA: Diagnosis not present

## 2024-01-08 DIAGNOSIS — Z1331 Encounter for screening for depression: Secondary | ICD-10-CM | POA: Diagnosis not present

## 2024-01-09 DIAGNOSIS — J449 Chronic obstructive pulmonary disease, unspecified: Secondary | ICD-10-CM | POA: Diagnosis not present

## 2024-01-09 DIAGNOSIS — I10 Essential (primary) hypertension: Secondary | ICD-10-CM | POA: Diagnosis not present

## 2024-01-09 DIAGNOSIS — I69354 Hemiplegia and hemiparesis following cerebral infarction affecting left non-dominant side: Secondary | ICD-10-CM | POA: Diagnosis not present

## 2024-01-09 DIAGNOSIS — E039 Hypothyroidism, unspecified: Secondary | ICD-10-CM | POA: Diagnosis not present

## 2024-01-09 DIAGNOSIS — K760 Fatty (change of) liver, not elsewhere classified: Secondary | ICD-10-CM | POA: Diagnosis not present

## 2024-01-09 DIAGNOSIS — E119 Type 2 diabetes mellitus without complications: Secondary | ICD-10-CM | POA: Diagnosis not present

## 2024-01-16 DIAGNOSIS — K760 Fatty (change of) liver, not elsewhere classified: Secondary | ICD-10-CM | POA: Diagnosis not present

## 2024-01-16 DIAGNOSIS — I10 Essential (primary) hypertension: Secondary | ICD-10-CM | POA: Diagnosis not present

## 2024-01-16 DIAGNOSIS — E039 Hypothyroidism, unspecified: Secondary | ICD-10-CM | POA: Diagnosis not present

## 2024-01-16 DIAGNOSIS — J449 Chronic obstructive pulmonary disease, unspecified: Secondary | ICD-10-CM | POA: Diagnosis not present

## 2024-01-16 DIAGNOSIS — E119 Type 2 diabetes mellitus without complications: Secondary | ICD-10-CM | POA: Diagnosis not present

## 2024-01-16 DIAGNOSIS — I69354 Hemiplegia and hemiparesis following cerebral infarction affecting left non-dominant side: Secondary | ICD-10-CM | POA: Diagnosis not present

## 2024-01-17 DIAGNOSIS — I10 Essential (primary) hypertension: Secondary | ICD-10-CM | POA: Diagnosis not present

## 2024-01-17 DIAGNOSIS — E119 Type 2 diabetes mellitus without complications: Secondary | ICD-10-CM | POA: Diagnosis not present

## 2024-01-17 DIAGNOSIS — E039 Hypothyroidism, unspecified: Secondary | ICD-10-CM | POA: Diagnosis not present

## 2024-01-17 DIAGNOSIS — I69354 Hemiplegia and hemiparesis following cerebral infarction affecting left non-dominant side: Secondary | ICD-10-CM | POA: Diagnosis not present

## 2024-01-17 DIAGNOSIS — J449 Chronic obstructive pulmonary disease, unspecified: Secondary | ICD-10-CM | POA: Diagnosis not present

## 2024-01-17 DIAGNOSIS — K760 Fatty (change of) liver, not elsewhere classified: Secondary | ICD-10-CM | POA: Diagnosis not present

## 2024-01-20 DIAGNOSIS — G4733 Obstructive sleep apnea (adult) (pediatric): Secondary | ICD-10-CM | POA: Diagnosis not present

## 2024-01-20 DIAGNOSIS — I10 Essential (primary) hypertension: Secondary | ICD-10-CM | POA: Diagnosis not present

## 2024-01-20 DIAGNOSIS — J449 Chronic obstructive pulmonary disease, unspecified: Secondary | ICD-10-CM | POA: Diagnosis not present

## 2024-01-20 DIAGNOSIS — K760 Fatty (change of) liver, not elsewhere classified: Secondary | ICD-10-CM | POA: Diagnosis not present

## 2024-01-20 DIAGNOSIS — I69354 Hemiplegia and hemiparesis following cerebral infarction affecting left non-dominant side: Secondary | ICD-10-CM | POA: Diagnosis not present

## 2024-01-20 DIAGNOSIS — E785 Hyperlipidemia, unspecified: Secondary | ICD-10-CM | POA: Diagnosis not present

## 2024-01-20 DIAGNOSIS — E039 Hypothyroidism, unspecified: Secondary | ICD-10-CM | POA: Diagnosis not present

## 2024-01-20 DIAGNOSIS — E119 Type 2 diabetes mellitus without complications: Secondary | ICD-10-CM | POA: Diagnosis not present

## 2024-01-22 ENCOUNTER — Other Ambulatory Visit: Payer: Self-pay

## 2024-01-22 DIAGNOSIS — H16232 Neurotrophic keratoconjunctivitis, left eye: Secondary | ICD-10-CM | POA: Diagnosis not present

## 2024-01-22 DIAGNOSIS — H16223 Keratoconjunctivitis sicca, not specified as Sjogren's, bilateral: Secondary | ICD-10-CM | POA: Diagnosis not present

## 2024-01-22 DIAGNOSIS — B0052 Herpesviral keratitis: Secondary | ICD-10-CM | POA: Diagnosis not present

## 2024-01-22 MED ORDER — ATORVASTATIN CALCIUM 40 MG PO TABS: 40.0000 mg | ORAL_TABLET | Freq: Every day | ORAL | 1 refills | Status: DC

## 2024-01-22 NOTE — Telephone Encounter (Signed)
 Historical medication Last OV: 12/19/2023 Next OV: not scheduled

## 2024-01-23 DIAGNOSIS — E039 Hypothyroidism, unspecified: Secondary | ICD-10-CM | POA: Diagnosis not present

## 2024-01-23 DIAGNOSIS — K760 Fatty (change of) liver, not elsewhere classified: Secondary | ICD-10-CM | POA: Diagnosis not present

## 2024-01-23 DIAGNOSIS — I10 Essential (primary) hypertension: Secondary | ICD-10-CM | POA: Diagnosis not present

## 2024-01-23 DIAGNOSIS — E119 Type 2 diabetes mellitus without complications: Secondary | ICD-10-CM | POA: Diagnosis not present

## 2024-01-23 DIAGNOSIS — J449 Chronic obstructive pulmonary disease, unspecified: Secondary | ICD-10-CM | POA: Diagnosis not present

## 2024-01-23 DIAGNOSIS — I69354 Hemiplegia and hemiparesis following cerebral infarction affecting left non-dominant side: Secondary | ICD-10-CM | POA: Diagnosis not present

## 2024-01-24 DIAGNOSIS — I69354 Hemiplegia and hemiparesis following cerebral infarction affecting left non-dominant side: Secondary | ICD-10-CM | POA: Diagnosis not present

## 2024-01-24 DIAGNOSIS — J449 Chronic obstructive pulmonary disease, unspecified: Secondary | ICD-10-CM | POA: Diagnosis not present

## 2024-01-24 DIAGNOSIS — K760 Fatty (change of) liver, not elsewhere classified: Secondary | ICD-10-CM | POA: Diagnosis not present

## 2024-01-24 DIAGNOSIS — E039 Hypothyroidism, unspecified: Secondary | ICD-10-CM | POA: Diagnosis not present

## 2024-01-24 DIAGNOSIS — E119 Type 2 diabetes mellitus without complications: Secondary | ICD-10-CM | POA: Diagnosis not present

## 2024-01-24 DIAGNOSIS — I10 Essential (primary) hypertension: Secondary | ICD-10-CM | POA: Diagnosis not present

## 2024-01-28 ENCOUNTER — Telehealth: Payer: Self-pay

## 2024-01-28 DIAGNOSIS — E039 Hypothyroidism, unspecified: Secondary | ICD-10-CM | POA: Diagnosis not present

## 2024-01-28 DIAGNOSIS — I69354 Hemiplegia and hemiparesis following cerebral infarction affecting left non-dominant side: Secondary | ICD-10-CM | POA: Diagnosis not present

## 2024-01-28 DIAGNOSIS — K760 Fatty (change of) liver, not elsewhere classified: Secondary | ICD-10-CM | POA: Diagnosis not present

## 2024-01-28 DIAGNOSIS — R531 Weakness: Secondary | ICD-10-CM

## 2024-01-28 DIAGNOSIS — I10 Essential (primary) hypertension: Secondary | ICD-10-CM | POA: Diagnosis not present

## 2024-01-28 DIAGNOSIS — E119 Type 2 diabetes mellitus without complications: Secondary | ICD-10-CM | POA: Diagnosis not present

## 2024-01-28 DIAGNOSIS — J449 Chronic obstructive pulmonary disease, unspecified: Secondary | ICD-10-CM | POA: Diagnosis not present

## 2024-01-28 NOTE — Telephone Encounter (Signed)
 Copied from CRM #[id number]. Topic: General - Other >> Jan 28, 2024  4:13 PM Rosina BIRCH wrote: Reason for CRM: stacey from [facility] home health called requesting a referral for outpatient physical therapy for the patient at [facility]. Harlene stated the patient has reached the maximum potential for in house and the patient will benefit from outpatient physical therapy at [facility] [phone]

## 2024-01-28 NOTE — Addendum Note (Signed)
 Addended by: MARYLYNN VERNEITA CROME on: 01/28/2024 05:14 PM   Modules accepted: Orders

## 2024-01-28 NOTE — Telephone Encounter (Signed)
I have pended referral for your approval.  

## 2024-01-28 NOTE — Addendum Note (Signed)
 Addended by: HARRIETTE RAISIN on: 01/28/2024 05:06 PM   Modules accepted: Orders

## 2024-01-30 DIAGNOSIS — I10 Essential (primary) hypertension: Secondary | ICD-10-CM | POA: Diagnosis not present

## 2024-01-30 DIAGNOSIS — E039 Hypothyroidism, unspecified: Secondary | ICD-10-CM | POA: Diagnosis not present

## 2024-01-30 DIAGNOSIS — E119 Type 2 diabetes mellitus without complications: Secondary | ICD-10-CM | POA: Diagnosis not present

## 2024-01-30 DIAGNOSIS — J449 Chronic obstructive pulmonary disease, unspecified: Secondary | ICD-10-CM | POA: Diagnosis not present

## 2024-01-30 DIAGNOSIS — K760 Fatty (change of) liver, not elsewhere classified: Secondary | ICD-10-CM | POA: Diagnosis not present

## 2024-01-30 DIAGNOSIS — I69354 Hemiplegia and hemiparesis following cerebral infarction affecting left non-dominant side: Secondary | ICD-10-CM | POA: Diagnosis not present

## 2024-01-31 DIAGNOSIS — I69354 Hemiplegia and hemiparesis following cerebral infarction affecting left non-dominant side: Secondary | ICD-10-CM | POA: Diagnosis not present

## 2024-01-31 DIAGNOSIS — E119 Type 2 diabetes mellitus without complications: Secondary | ICD-10-CM | POA: Diagnosis not present

## 2024-01-31 DIAGNOSIS — J449 Chronic obstructive pulmonary disease, unspecified: Secondary | ICD-10-CM | POA: Diagnosis not present

## 2024-01-31 DIAGNOSIS — E039 Hypothyroidism, unspecified: Secondary | ICD-10-CM | POA: Diagnosis not present

## 2024-01-31 DIAGNOSIS — I10 Essential (primary) hypertension: Secondary | ICD-10-CM | POA: Diagnosis not present

## 2024-01-31 DIAGNOSIS — K760 Fatty (change of) liver, not elsewhere classified: Secondary | ICD-10-CM | POA: Diagnosis not present

## 2024-02-03 ENCOUNTER — Telehealth: Payer: Self-pay

## 2024-02-03 MED ORDER — TELMISARTAN 20 MG PO TABS: 20.0000 mg | ORAL_TABLET | Freq: Every day | ORAL | 0 refills | Status: DC

## 2024-02-03 NOTE — Telephone Encounter (Unsigned)
 Copied from CRM [id number]. Topic: Clinical - Prescription Issue >> Feb 03, 2024 12:03 PM Sasha H wrote: Reason for CRM: Pt's daughter states that her pharmacy will not refill telmisartan  (MICARDIS ) 20 MG tablet because it is too early, they state that the pt should have been taking a half of pill and not a whole and her daughter states they thought it was supposed to be a whole one and the pharmacy wants clarification on how it should be taken along with permission to refill before September. Pt's daughter would like a call back >> Feb 03, 2024  1:21 PM Emylou G wrote: Daughter waiting for call back.. she returned our call

## 2024-02-03 NOTE — Telephone Encounter (Signed)
 Per the last office note pt was to continue taking the Telmisartan  20 mg daily. Pt has been taking the 20 mg daily. However the rx is the chart states to take half a tablet daily. Should pt be taking 20 mg or 10 mg? Pt is out of medication.

## 2024-02-03 NOTE — Telephone Encounter (Signed)
 Spoke with pt's daughter to let her know that the medication hs been sent in. I have also advised the daughter to send us  a weeks worth of bp readings so Dr. Marylynn can review and adjust medication if needed since daughter stated that sometimes pt's bp is in the 90s/60s. Daughter gave a verbal understanding.

## 2024-02-03 NOTE — Telephone Encounter (Signed)
 LMTCB

## 2024-02-03 NOTE — Telephone Encounter (Signed)
 Copied from CRM [id number]. Topic: Clinical - Prescription Issue >> Feb 03, 2024 12:03 PM Lauren Morris wrote: Reason for CRM: Pt's daughter states that her pharmacy will not refill telmisartan  (MICARDIS ) 20 MG tablet because it is too early, they state that the pt should have been taking a half of pill and not a whole and her daughter states they thought it was supposed to be a whole one and the pharmacy wants clarification on how it should be taken along with permission to refill before September. Pt's daughter would like a call back

## 2024-02-03 NOTE — Addendum Note (Signed)
 Addended by: Travares Nelles on: 02/03/2024 03:42 PM   Modules accepted: Orders

## 2024-03-03 ENCOUNTER — Ambulatory Visit: Payer: Medicare Other | Admitting: Internal Medicine

## 2024-03-04 DIAGNOSIS — H16223 Keratoconjunctivitis sicca, not specified as Sjogren's, bilateral: Secondary | ICD-10-CM | POA: Diagnosis not present

## 2024-03-04 DIAGNOSIS — H35373 Puckering of macula, bilateral: Secondary | ICD-10-CM | POA: Diagnosis not present

## 2024-03-04 DIAGNOSIS — H401131 Primary open-angle glaucoma, bilateral, mild stage: Secondary | ICD-10-CM | POA: Diagnosis not present

## 2024-03-04 DIAGNOSIS — H3581 Retinal edema: Secondary | ICD-10-CM | POA: Diagnosis not present

## 2024-03-13 DIAGNOSIS — H16232 Neurotrophic keratoconjunctivitis, left eye: Secondary | ICD-10-CM | POA: Diagnosis not present

## 2024-03-13 DIAGNOSIS — B0052 Herpesviral keratitis: Secondary | ICD-10-CM | POA: Diagnosis not present

## 2024-03-13 DIAGNOSIS — H16223 Keratoconjunctivitis sicca, not specified as Sjogren's, bilateral: Secondary | ICD-10-CM | POA: Diagnosis not present

## 2024-03-24 ENCOUNTER — Ambulatory Visit: Admitting: Internal Medicine

## 2024-03-27 ENCOUNTER — Ambulatory Visit (INDEPENDENT_AMBULATORY_CARE_PROVIDER_SITE_OTHER): Admitting: Internal Medicine

## 2024-03-27 ENCOUNTER — Encounter: Payer: Self-pay | Admitting: Internal Medicine

## 2024-03-27 ENCOUNTER — Other Ambulatory Visit

## 2024-03-27 VITALS — BP 124/72 | HR 63 | Ht 62.0 in | Wt 149.4 lb

## 2024-03-27 DIAGNOSIS — E0500 Thyrotoxicosis with diffuse goiter without thyrotoxic crisis or storm: Secondary | ICD-10-CM | POA: Diagnosis not present

## 2024-03-27 LAB — T3, FREE: T3, Free: 2.3 pg/mL (ref 2.3–4.2)

## 2024-03-27 LAB — T4, FREE: Free T4: 1.1 ng/dL (ref 0.8–1.8)

## 2024-03-27 LAB — TSH: TSH: 3.5 m[IU]/L (ref 0.40–4.50)

## 2024-03-27 NOTE — Patient Instructions (Signed)
 Please stop at the lab.  Please come back for a follow-up appointment in 1 year.

## 2024-03-27 NOTE — Progress Notes (Addendum)
 Patient ID: Lauren Morris, female   DOB: Jul 03, 1931, 89 y.o.   MRN: [id number]   HPI  MARKIYAH GAHM is a 89 y.o.-year-old female, initially referred by her PCP, Dr. Marylynn, returning for follow-up for Graves' disease, with previous history of hypothyroidism.  She is here with her daughter Waynette Pacini) who offers part of the history related to past medical history, symptoms, medications.  Last visit 1 year ago.  Interim history: She had a stroke in 09/2023 >> her L arm and leg a little weak.  She finished physical therapy and she is not exercising at home. No palpitations, tremors, anxiety, insomnia.  However, she lost 9 lbs since last OV.  Daughter mentions that this is because she is not eating out so much anymore.  Reviewed history: Pt. has a history of hypothyroidism since at least 2012 >> she was on levothyroxine  25 mcg. However, in 02/2019, her TSH was found to be undetectably low and her levothyroxine  dose was gradually reduced and finally stopped in 02/2019.  Despite this, her TSH remained low.   We started methimazole  5 mg daily in 05/2019, and then decreased to 2.5 mg daily.     Before last visit, due to concerns for transaminitis, and per GI recommendation, I advised her to stop methimazole  before our visit in 02/2020. She did so.   She is on atenolol  25 mg twice a day.  Reviewed her TFTs: Lab Results  Component Value Date   TSH 3.05 09/10/2023   TSH 3.51 03/04/2023   TSH 5.46 02/16/2022   TSH 5.53 (H) 09/04/2021   TSH 5.55 (H) 05/25/2021   TSH 4.38 08/30/2020   TSH 2.74 03/01/2020   TSH 2.14 12/31/2019   TSH 6.10 (H) 10/22/2019   TSH <0.01 (L) 06/23/2019   FREET4 1.04 03/04/2023   FREET4 0.87 02/16/2022   FREET4 0.81 08/30/2020   FREET4 0.95 03/01/2020   FREET4 0.86 12/31/2019   FREET4 0.70 10/22/2019   FREET4 1.39 06/23/2019   T3FREE 2.9 03/04/2023   T3FREE 2.3 02/16/2022   T3FREE 3.4 08/30/2020   T3FREE 3.3 03/01/2020   T3FREE 3.2 12/31/2019   T3FREE 2.9 10/22/2019    T3FREE 4.2 06/23/2019   Her TSI antibodies were elevated: 03/10/2019: TPO antibodies 5 (<9) Lab Results  Component Value Date   THGAB <1 03/10/2019   Lab Results  Component Value Date   TSI 167 (H) 06/23/2019   Thyroid  uptake and scan (09/17/2019): Homogeneous tracer distribution in both thyroid  lobes. No focal areas of increased or decreased tracer localization.   4 hour I-123 uptake = 15.8% (normal 5-20%) 24 hour I-123 uptake = 34.6% (normal 10-30%)   IMPRESSION: Normal thyroid  scan. Elevated 24 hour radio iodine uptake consistent with hyperthyroidism. Overall findings are consistent with Graves disease.   Before starting methimazole , she experienced weight loss-approximately 20 pounds in 10 months, fatigue, shortness of breath with exertion, hair loss.  Pt denies: - feeling nodules in neck - hoarseness - dysphagia - choking  No FH of thyroid  disease or thyroid  cancer. No h/o radiation tx to head or neck. No Biotin use. No recent steroids use.   She has a history of OSA and uses a CPAP.  ROS: + see HPI  I reviewed pt's medications, allergies, PMH, social hx, family hx, and changes were documented in the history of present illness. Otherwise, unchanged from my initial visit note.  Past Medical History:  Diagnosis Date   Community acquired pneumonia 04/05/2016   Cyst  hx o on right breast   DJD (degenerative joint disease)    right knee   Glaucoma    Hypertension    Pneumonia due to COVID-19 virus 12/12/2020   Septic shock (HCC) 10/14/2023   SIRS (systemic inflammatory response syndrome) (HCC) 10/10/2022   Sleep apnea    Thyroid  disease    UTI (urinary tract infection) 11/12/2021   Past Surgical History:  Procedure Laterality Date   ABDOMINAL HYSTERECTOMY     VITRECTOMY  July 2012   right eye with membrane peel [facility], [place])   Social History   Socioeconomic History   Marital status: Widowed    Spouse name: Not on file   Number of children: 1    Years of education: Not on file   Highest education level: Not on file  Occupational History    Homemaker  Tobacco Use   Smoking status: Never Smoker   Smokeless tobacco: Never Used  Substance and Sexual Activity   Alcohol  use: No   Drug use: No   Sexual activity: Not on file  Other Topics Concern   Not on file  Social History Narrative   Not on file   Social Determinants of Health   Financial Resource Strain:    Difficulty of Paying Living Expenses: Not on file  Food Insecurity:    Worried About Running Out of Food in the Last Year: Not on file   Ran Out of Food in the Last Year: Not on file  Transportation Needs:    Lack of Transportation (Medical): Not on file   Lack of Transportation (Non-Medical): Not on file  Physical Activity:    Days of Exercise per Week: Not on file   Minutes of Exercise per Session: Not on file  Stress:    Feeling of Stress : Not on file  Social Connections:    Frequency of Communication with Friends and Family: Not on file   Frequency of Social Gatherings with Friends and Family: Not on file   Attends Religious Services: Not on file   Active Member of Clubs or Organizations: Not on file   Attends Banker Meetings: Not on file   Marital Status: Not on file  Intimate Partner Violence:    Fear of Current or Ex-Partner: Not on file   Emotionally Abused: Not on file   Physically Abused: Not on file   Sexually Abused: Not on file   On:  Current Outpatient Medications on File Prior to Visit  Medication Sig Dispense Refill   albuterol  (PROVENTIL ) (2.5 MG/3ML) 0.083% nebulizer solution Take 3 mLs (2.5 mg total) by nebulization every 6 (six) hours as needed for wheezing or shortness of breath. 75 mL 12   ANORO ELLIPTA  62.5-25 MCG/ACT AEPB TAKE 1 PUFF BY MOUTH EVERY DAY 60 each 5   Ascorbic Acid  (VITAMIN C ) 1000 MG tablet Take 1 tablet (1,000 mg total) by mouth daily. 90 tablet 3   Aspirin  81 MG CAPS Take 1 capsule by mouth daily at 6  (six) AM.     atenolol  (TENORMIN ) 25 MG tablet Take 1 tablet (25 mg total) by mouth 2 (two) times daily. 180 tablet 1   atorvastatin  (LIPITOR) 40 MG tablet Take 1 tablet (40 mg total) by mouth daily. 90 tablet 1   Calcium  Carb-Cholecalciferol (CALCIUM  1000 + D PO) Take 1 tablet by mouth daily.     Cenegermin-bkbj (OXERVATE) 0.002 % SOLN Apply 1 drop to eye 6 (six) times daily.     famotidine  (PEPCID ) 20  MG tablet Take 1 tablet (20 mg total) by mouth at bedtime.     fish oil-omega-3 fatty acids  1000 MG capsule Take 2 capsules (2 g total) by mouth daily. 6 capsule 11   Multiple Vitamin (MULTIVITAMIN WITH MINERALS) TABS tablet Take 1 tablet by mouth daily. 30 tablet 0   RESTASIS  0.05 % ophthalmic emulsion Place 1 drop into both eyes 2 (two) times daily.     spironolactone  (ALDACTONE ) 25 MG tablet Take 1 tablet (25 mg total) by mouth daily. 90 tablet 1   telmisartan  (MICARDIS ) 20 MG tablet Take 1 tablet (20 mg total) by mouth at bedtime. 90 tablet 0   timolol  (TIMOPTIC ) 0.5 % ophthalmic solution SMARTSIG:In Eye(s)     torsemide  (DEMADEX ) 20 MG tablet Take 1 tablet (20 mg total) by mouth daily. 30 tablet 2   zinc  sulfate 220 (50 Zn) MG capsule Take 1 capsule (220 mg total) by mouth daily. 30 capsule 11   telmisartan  (MICARDIS ) 20 MG tablet Take 0.5 tablets (10 mg total) by mouth daily. (Patient not taking: Reported on 03/27/2024) 30 tablet 2   valACYclovir  (VALTREX ) 500 MG tablet Take 500 mg by mouth at bedtime. (Patient not taking: Reported on 03/27/2024)     Zinc  Oxide (TRIPLE PASTE) 12.8 % ointment Apply 1 Application topically. Every shift (Patient not taking: Reported on 03/27/2024)     No current facility-administered medications on file prior to visit.   Allergies  Allergen Reactions   Augmentin  [Amoxicillin -Pot Clavulanate] Rash    Facial rash during hospital stay 09/2023   Family History  Problem Relation Age of Onset   Cancer Father    Hypertension Father    Heart attack Mother     Hypertension Mother   Also, HTN mother and father, heart disease in mother.  PE: BP 124/72   Pulse 63   Ht 5' 2 (1.575 m)   Wt 149 lb 6.4 oz (67.8 kg)   SpO2 99%   BMI 27.33 kg/m  Wt Readings from Last 15 Encounters:  03/27/24 149 lb 6.4 oz (67.8 kg)  12/19/23 152 lb (68.9 kg)  12/10/23 153 lb 12.8 oz (69.8 kg)  10/14/23 167 lb 14.4 oz (76.2 kg)  09/10/23 159 lb 3.2 oz (72.2 kg)  03/04/23 158 lb 6.4 oz (71.8 kg)  11/26/22 154 lb 12.8 oz (70.2 kg)  11/09/22 156 lb 3.2 oz (70.9 kg)  11/08/22 155 lb 13.8 oz (70.7 kg)  10/15/22 155 lb 12.8 oz (70.7 kg)  10/10/22 158 lb (71.7 kg)  05/07/22 156 lb 12.8 oz (71.1 kg)  02/16/22 157 lb 3.2 oz (71.3 kg)  11/16/21 160 lb 12.8 oz (72.9 kg)  11/12/21 158 lb 11.7 oz (72 kg)   Constitutional: overweight, in NAD Eyes: no exophthalmos ENT: no masses palpated in neck, no cervical lymphadenopathy Cardiovascular: RRR, No MRG Respiratory: CTA B Musculoskeletal: no deformities Skin: no rashes, + ecchymoses on forearms Neurological: no tremor with outstretched hands  ASSESSMENT: 1.  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 History of hypothyroidism  PLAN:  1.  Thyrotoxicosis due to Graves' disease -Patient with a history of hypothyroidism, but with development of thyrotoxicosis in 02/2019, which persisted despite stopping levothyroxine .  She lost more than 20 pounds in the months before her Graves' disease was diagnosed.  She also developed tachycardia, shortness of breath with exertion, tremor.  TFTs returned in the thyrotoxic range and TSI's are elevated.  A thyroid  uptake and scan in 08/2019 was consistent with Graves' disease.  We started  methimazole  initially at 5 mg daily and we decreased the dose afterwards to 2.5 mg daily.  She did well on this, however, she had persistent transaminitis and her gastroenterologist, Dr. Luis, recommended to stop methimazole .  We stopped this in 02/2020. -Fortunately, her TFTs remained normal  afterwards.  We previously discussed about possible modalities of treatment for Graves' disease to include RAI treatment and surgery since we could not use thionamides.  She did agree to have RAI treatment if absolutely needed in the future. - At today's visit, she did have a weight loss of 9 pounds since last visit possibly due to limiting eating out, but not sure this, palpitations, or othe possibly thyrotoxic r complaints. - Continues on atenolol  25 mg twice a day.   - No active signs of Graves' ophthalmopathy: no double vision, blurry vision, eye pain, chemosis - Will recheck her TFTs today -I will see her back in a year or possibly sooner for labs  2.  History of hypothyroidism -She was previously on a very low dose of levothyroxine , 25 mcg daily but off since 10/2018 -If TFTs increase, we may need to restart levothyroxine , but only if the TSH increases >7 - Will recheck her TFTs today  Component     Latest Ref Rng 03/27/2024  TSH     0.40 - 4.50 mIU/L 3.50   T4,Free(Direct)     0.8 - 1.8 ng/dL 1.1   Triiodothyronine,Free,Serum     2.3 - 4.2 pg/mL 2.3   TFTs remain normal.  Lela Fendt, MD PhD [facility]

## 2024-03-30 ENCOUNTER — Ambulatory Visit: Payer: Self-pay | Admitting: Internal Medicine

## 2024-04-08 DIAGNOSIS — H16232 Neurotrophic keratoconjunctivitis, left eye: Secondary | ICD-10-CM | POA: Diagnosis not present

## 2024-04-08 DIAGNOSIS — B0052 Herpesviral keratitis: Secondary | ICD-10-CM | POA: Diagnosis not present

## 2024-04-08 DIAGNOSIS — H16223 Keratoconjunctivitis sicca, not specified as Sjogren's, bilateral: Secondary | ICD-10-CM | POA: Diagnosis not present

## 2024-04-15 ENCOUNTER — Other Ambulatory Visit (INDEPENDENT_AMBULATORY_CARE_PROVIDER_SITE_OTHER): Payer: Self-pay | Admitting: Vascular Surgery

## 2024-04-15 DIAGNOSIS — I7102 Dissection of abdominal aorta: Secondary | ICD-10-CM

## 2024-04-17 ENCOUNTER — Ambulatory Visit (INDEPENDENT_AMBULATORY_CARE_PROVIDER_SITE_OTHER): Admitting: Vascular Surgery

## 2024-04-17 ENCOUNTER — Encounter (INDEPENDENT_AMBULATORY_CARE_PROVIDER_SITE_OTHER): Payer: Self-pay | Admitting: Vascular Surgery

## 2024-04-17 ENCOUNTER — Ambulatory Visit (INDEPENDENT_AMBULATORY_CARE_PROVIDER_SITE_OTHER)

## 2024-04-17 VITALS — BP 138/68 | HR 62 | Resp 16 | Ht 62.0 in | Wt 148.8 lb

## 2024-04-17 DIAGNOSIS — E119 Type 2 diabetes mellitus without complications: Secondary | ICD-10-CM

## 2024-04-17 DIAGNOSIS — I7102 Dissection of abdominal aorta: Secondary | ICD-10-CM

## 2024-04-17 DIAGNOSIS — I10 Essential (primary) hypertension: Secondary | ICD-10-CM | POA: Diagnosis not present

## 2024-04-17 DIAGNOSIS — E785 Hyperlipidemia, unspecified: Secondary | ICD-10-CM

## 2024-04-17 NOTE — Assessment & Plan Note (Signed)
 blood pressure control important in reducing the progression of atherosclerotic disease. On appropriate oral medications.

## 2024-04-17 NOTE — Assessment & Plan Note (Signed)
 We performed an aortic duplex today.  The maximal diameter the aorta was just over 2.5 cm.  There is a focal dissection without flow limitation.  This is consistent with her previous MR study. We discussed the importance of blood pressure control to avoid aneurysmal degeneration.  This is a chronic dissection and unlikely to cause any major flow-limiting problems at this point.  No current intervention or treatment is necessary.  I will plan to follow this on an annual basis going forward.

## 2024-04-17 NOTE — Progress Notes (Signed)
 Patient ID: Lauren Morris, female   DOB: 12-Sep-1931, 89 y.o.   MRN: [id number]  Chief Complaint  Patient presents with   Establish Care    New patient AAA + consult Aortic disection ref. Tullo    HPI Lauren Morris is a 89 y.o. female.  I am asked to see the patient by Dr. Tullo for evaluation of an aortic dissection and penetrating ulcer.  I have independently reviewed an MR study from approximately 6 months ago that demonstrated this.  This had been seen on previous studies prior to that.  There is mild enlargement of the aorta at this location associated with a dissection, but no apparent flow limitation and no frank aneurysmal degeneration.  Patient does not have any aneurysm or dissection symptoms. Specifically, the patient denies new back or abdominal pain, or signs of peripheral embolization.  We performed an aortic duplex today.  The maximal diameter the aorta was just over 2.5 cm.  There is a focal dissection without flow limitation.  This is consistent with her previous MR study.   Past Medical History:  Diagnosis Date   Community acquired pneumonia 04/05/2016   Cyst    hx o on right breast   DJD (degenerative joint disease)    right knee   Glaucoma    Hypertension    Pneumonia due to COVID-19 virus 12/12/2020   Septic shock (HCC) 10/14/2023   SIRS (systemic inflammatory response syndrome) (HCC) 10/10/2022   Sleep apnea    Thyroid  disease    UTI (urinary tract infection) 11/12/2021    Past Surgical History:  Procedure Laterality Date   ABDOMINAL HYSTERECTOMY     VITRECTOMY  July 2012   right eye with membrane peel Lissa, [place])     Family History  Problem Relation Age of Onset   Cancer Father    Hypertension Father    Heart attack Mother    Hypertension Mother       Social History   Tobacco Use   Smoking status: Never   Smokeless tobacco: Never  Vaping Use   Vaping status: Never Used  Substance Use Topics   Alcohol  use: No   Drug use: No      Allergies  Allergen Reactions   Augmentin  [Amoxicillin -Pot Clavulanate] Rash    Facial rash during hospital stay 09/2023    Current Outpatient Medications  Medication Sig Dispense Refill   albuterol  (PROVENTIL ) (2.5 MG/3ML) 0.083% nebulizer solution Take 3 mLs (2.5 mg total) by nebulization every 6 (six) hours as needed for wheezing or shortness of breath. 75 mL 12   ANORO ELLIPTA  62.5-25 MCG/ACT AEPB TAKE 1 PUFF BY MOUTH EVERY DAY 60 each 5   Ascorbic Acid  (VITAMIN C ) 1000 MG tablet Take 1 tablet (1,000 mg total) by mouth daily. 90 tablet 3   Aspirin  81 MG CAPS Take 1 capsule by mouth daily at 6 (six) AM.     atenolol  (TENORMIN ) 25 MG tablet Take 1 tablet (25 mg total) by mouth 2 (two) times daily. 180 tablet 1   atorvastatin  (LIPITOR) 40 MG tablet Take 1 tablet (40 mg total) by mouth daily. 90 tablet 1   Calcium  Carb-Cholecalciferol (CALCIUM  1000 + D PO) Take 1 tablet by mouth daily.     Cenegermin-bkbj (OXERVATE) 0.002 % SOLN Apply 1 drop to eye 6 (six) times daily.     famotidine  (PEPCID ) 20 MG tablet Take 1 tablet (20 mg total) by mouth at bedtime.     fish oil-omega-3 fatty  acids 1000 MG capsule Take 2 capsules (2 g total) by mouth daily. 6 capsule 11   Multiple Vitamin (MULTIVITAMIN WITH MINERALS) TABS tablet Take 1 tablet by mouth daily. 30 tablet 0   RESTASIS  0.05 % ophthalmic emulsion Place 1 drop into both eyes 2 (two) times daily.     spironolactone  (ALDACTONE ) 25 MG tablet Take 1 tablet (25 mg total) by mouth daily. 90 tablet 1   telmisartan  (MICARDIS ) 20 MG tablet Take 1 tablet (20 mg total) by mouth at bedtime. 90 tablet 0   timolol  (TIMOPTIC ) 0.5 % ophthalmic solution SMARTSIG:In Eye(s)     torsemide  (DEMADEX ) 20 MG tablet Take 1 tablet (20 mg total) by mouth daily. 30 tablet 2   zinc  sulfate 220 (50 Zn) MG capsule Take 1 capsule (220 mg total) by mouth daily. 30 capsule 11   No current facility-administered medications for this visit.      REVIEW OF SYSTEMS  (Negative unless checked)  Constitutional: [] Weight loss  [] Fever  [] Chills Cardiac: [] Chest pain   [] Chest pressure   [] Palpitations   [] Shortness of breath when laying flat   [] Shortness of breath at rest   [] Shortness of breath with exertion. Vascular:  [] Pain in legs with walking   [] Pain in legs at rest   [] Pain in legs when laying flat   [] Claudication   [] Pain in feet when walking  [] Pain in feet at rest  [] Pain in feet when laying flat   [] History of DVT   [] Phlebitis   [] Swelling in legs   [] Varicose veins   [] Non-healing ulcers Pulmonary:   [] Uses home oxygen    [] Productive cough   [] Hemoptysis   [] Wheeze  [] COPD   [] Asthma Neurologic:  [] Dizziness  [] Blackouts   [] Seizures   [] History of stroke   [] History of TIA  [] Aphasia   [] Temporary blindness   [] Dysphagia   [] Weakness or numbness in arms   [] Weakness or numbness in legs Musculoskeletal:  [x] Arthritis   [] Joint swelling   [x] Joint pain   [] Low back pain Hematologic:  [] Easy bruising  [] Easy bleeding   [] Hypercoagulable state   [] Anemic  [] Hepatitis Gastrointestinal:  [] Blood in stool   [] Vomiting blood  [] Gastroesophageal reflux/heartburn   [] Abdominal pain Genitourinary:  [] Chronic kidney disease   [] Difficult urination  [] Frequent urination  [] Burning with urination   [] Hematuria Skin:  [] Rashes   [] Ulcers   [] Wounds Psychological:  [] History of anxiety   []  History of major depression.    Physical Exam BP 138/68 (BP Location: Right Arm, Patient Position: Sitting, Cuff Size: Normal)   Pulse 62   Resp 16   Ht 5' 2 (1.575 m)   Wt 148 lb 12.8 oz (67.5 kg)   BMI 27.22 kg/m  Gen:  WD/WN, NAD.  Appears younger than stated age Head: [place]/AT, No temporalis wasting.  Ear/Nose/Throat: Hearing grossly intact, nares w/o erythema or drainage, oropharynx w/o Erythema/Exudate Eyes: Conjunctiva clear, sclera non-icteric  Neck: trachea midline.  No JVD.  Pulmonary:  Good air movement, respirations not labored, no use of accessory muscles   Cardiac: RRR, no JVD Vascular:  Vessel Right Left  Radial Palpable Palpable                                   Gastrointestinal:. No masses, surgical incisions, or scars. Musculoskeletal: M/S 5/5 throughout.  Extremities without ischemic changes.  No deformity or atrophy.  No edema. Neurologic: Sensation grossly intact in  extremities.  Symmetrical.  Speech is fluent. Motor exam as listed above. Psychiatric: Judgment intact, Mood & affect appropriate for pt's clinical situation. Dermatologic: No rashes or ulcers noted.  No cellulitis or open wounds.    Radiology No results found.  Labs Recent Results (from the past 2160 hours)  TSH     Status: None   Collection Time: 03/27/24  4:41 PM  Result Value Ref Range   TSH 3.50 0.40 - 4.50 mIU/L  T3, free     Status: None   Collection Time: 03/27/24  4:41 PM  Result Value Ref Range   T3, Free 2.3 2.3 - 4.2 pg/mL  T4, free     Status: None   Collection Time: 03/27/24  4:41 PM  Result Value Ref Range   Free T4 1.1 0.8 - 1.8 ng/dL    Assessment/Plan:  Aortic dissection, abdominal (HCC) We performed an aortic duplex today.  The maximal diameter the aorta was just over 2.5 cm.  There is a focal dissection without flow limitation.  This is consistent with her previous MR study. We discussed the importance of blood pressure control to avoid aneurysmal degeneration.  This is a chronic dissection and unlikely to cause any major flow-limiting problems at this point.  No current intervention or treatment is necessary.  I will plan to follow this on an annual basis going forward.  Essential hypertension blood pressure control important in reducing the progression of atherosclerotic disease. On appropriate oral medications.   Type 2 diabetes mellitus (HCC) blood glucose control important in reducing the progression of atherosclerotic disease. Also, involved in wound healing. On appropriate medications.   Hyperlipidemia LDL goal  <100 lipid control important in reducing the progression of atherosclerotic disease. Continue statin therapy      Selinda Gu 04/17/2024, 10:06 AM   This note was created with Dragon medical transcription system.  Any errors from dictation are unintentional.

## 2024-04-17 NOTE — Assessment & Plan Note (Signed)
 blood glucose control important in reducing the progression of atherosclerotic disease. Also, involved in wound healing. On appropriate medications.

## 2024-04-17 NOTE — Assessment & Plan Note (Signed)
 lipid control important in reducing the progression of atherosclerotic disease. Continue statin therapy

## 2024-04-17 NOTE — Patient Instructions (Signed)
 Aortic Dissection  Aortic dissection happens when there is a tear in the wall of the body's main blood vessel (aorta). The aorta leads out of the heart (ascending aorta), curves around, and then goes down the chest (descending aorta) and into the abdomen to supply arteries with blood. The wall of the aorta has inner and outer layers. When aortic dissection occurs, blood collects along the tear, while one part of the aorta continues to carry blood to the body. Blood can also flow into the tear between the layers of the aorta. The torn part of the aorta fills with blood and swells. This can reduce blood flow through the part of the aorta that is still supplying blood to the body. Aortic dissection is a medical emergency. What are the causes? This condition is commonly caused by weakening of the artery wall due to high blood pressure. Other causes may include: An injury, such as from a car crash. A complication from heart surgery or from a diagnostic procedure called coronary catheterization. Weakness of the artery wall due to birth defects or genetic problems that affect the connective tissues, such as Marfan syndrome. In some cases, the cause is not known. What increases the risk? The following factors may make you more likely to develop this condition: Having certain medical conditions, such as: High blood pressure (hypertension). Hardening and narrowing of the arteries (atherosclerosis). A condition that causes inflammation of blood vessels, such as giant cell arteritis. Having two cusps in the aortic valve instead of three (bicuspid aortic valve). Having a bulge in the wall of the aorta (aortic aneurysm). Being female. Being pregnant. Being older than age 47. Using stimulants such as cocaine or methamphetamine. Smoking. Lifting heavy weights or doing other types of strength training, also called high-intensity resistance training. What are the signs or symptoms? Signs and symptoms of aortic  dissection start suddenly. The most common symptoms are: Severe, sharp chest pain that may feel like tearing or stabbing. Severe pain that spreads to the back, neck, jaw, abdomen, or down the legs. Severe pain between the shoulder blades in the back. Other symptoms may include: Trouble breathing. Dizziness or fainting. Sudden weakness on one side of the body. Nausea or vomiting. Trouble swallowing. Coughing up blood. Vomiting blood. Clammy skin. How is this diagnosed? This condition may be diagnosed based on: Your symptoms and a physical exam. This may include: Listening for abnormal blood flow sounds (murmurs) in your chest or abdomen. Checking your pulse in your arms and legs. Checking your blood pressure to see whether it is low, or whether there is a difference between the measurements from your right arm and left arm. Comparing your heart beat to your pulse in your arms and legs. Electrocardiogram (ECG). This test measures the electrical activity in your heart. Chest X-ray. CT scan. MRI. Echocardiogram. This uses sound waves to make images of your heart. Transesophageal echocardiography. This test takes images of your heart through the esophagus. Blood tests. How is this treated? It is important to treat aortic dissection as quickly as possible. Treatment may start as soon as your health care provider thinks that you have aortic dissection. Treatment depends on where the dissection is, how severe it is, and your overall health. Treatment may include: Medicines to lower your heart rate and blood pressure. Surgery to repair your aorta using artificial material (syntheticgraft). A procedure to insert a stent-graft into the aorta (endovascular procedure). During this procedure: A long, thin tube (stent) is inserted into an artery near  the groin (femoral artery). The stent is moved up to the damaged part of the aorta. The stent is opened to help improve blood flow and prevent future  dissection. Follow these instructions at home: If you had surgery, follow instructions from your health care provider about home care after the procedure. Activity You may have to avoid lifting. Ask your health care provider how much you can safely lift. Avoid activities that could injure your chest or abdomen. Ask your health care provider what activities are safe for you. After you have recovered, try to stay active. Ask your health care provider what activities are safe for you after recovery. Enroll in cardiac rehabilitation. This is a program that helps to improve your health and well-being. It includes exercise training, education, and counseling to help you recover. Lifestyle     Eat a heart-healthy diet, which includes lots of fresh fruits and vegetables, low-fat (lean) protein, and whole grains. Limit unhealthy fats. Eat more healthy fats such as avocados, eggs, and oily fish. Work with your health care provider to treat any other conditions that you may have, such as obesity, high blood pressure, or diabetes. Do not use any products that contain nicotine or tobacco. These products include cigarettes, chewing tobacco, and vaping devices, such as e-cigarettes. If you need help quitting, ask your health care provider. Do not use illegal drugs, especially stimulants such as cocaine or methamphetamine. General instructions Take over-the-counter and prescription medicines only as told by your health care provider. Talk with your health care provider about how to manage stress. Keep all follow-up visits. This is important. Get help right away if: You develop any symptoms of aortic dissection after treatment, including severe pain in your chest, back, or abdomen. You have pain in your chest. You have weakness in your arm or leg. You have pain in your abdomen. You have trouble breathing or you develop a cough. You faint. You develop a racing heartbeat (palpitations). These symptoms may  be an emergency. Get help right away. Call 911. Do not wait to see if the symptoms will go away. Do not drive yourself to the hospital. Summary Aortic dissection happens when there is a tear in the wall of the body's main blood vessel (aorta). It is a medical emergency. The most common symptom is severe pain in the chest or pain that spreads (radiates) to the back, neck, jaw, or abdomen. It is important to treat aortic dissection as quickly as possible. Treatment includes medicines and surgery. Take over-the-counter and prescription medicines only as told by your health care provider. This information is not intended to replace advice given to you by your health care provider. Make sure you discuss any questions you have with your health care provider. Document Revised: 10/18/2021 Document Reviewed: 04/25/2021 Elsevier Patient Education  2024 [employer].

## 2024-04-22 ENCOUNTER — Other Ambulatory Visit: Payer: Self-pay | Admitting: Internal Medicine

## 2024-05-15 ENCOUNTER — Other Ambulatory Visit: Payer: Self-pay

## 2024-05-15 ENCOUNTER — Emergency Department

## 2024-05-15 ENCOUNTER — Inpatient Hospital Stay
Admission: EM | Admit: 2024-05-15 | Discharge: 2024-05-18 | DRG: 690 | Disposition: A | Discharge status: Home Health | Attending: Internal Medicine | Admitting: Internal Medicine

## 2024-05-15 DIAGNOSIS — R918 Other nonspecific abnormal finding of lung field: Secondary | ICD-10-CM | POA: Diagnosis not present

## 2024-05-15 DIAGNOSIS — R531 Weakness: Secondary | ICD-10-CM | POA: Diagnosis present

## 2024-05-15 DIAGNOSIS — I10 Essential (primary) hypertension: Secondary | ICD-10-CM | POA: Diagnosis present

## 2024-05-15 DIAGNOSIS — Z7982 Long term (current) use of aspirin: Secondary | ICD-10-CM

## 2024-05-15 DIAGNOSIS — I610 Nontraumatic intracerebral hemorrhage in hemisphere, subcortical: Secondary | ICD-10-CM | POA: Diagnosis not present

## 2024-05-15 DIAGNOSIS — Z888 Allergy status to other drugs, medicaments and biological substances status: Secondary | ICD-10-CM | POA: Diagnosis not present

## 2024-05-15 DIAGNOSIS — E0500 Thyrotoxicosis with diffuse goiter without thyrotoxic crisis or storm: Secondary | ICD-10-CM | POA: Diagnosis present

## 2024-05-15 DIAGNOSIS — Z8616 Personal history of COVID-19: Secondary | ICD-10-CM

## 2024-05-15 DIAGNOSIS — E119 Type 2 diabetes mellitus without complications: Secondary | ICD-10-CM | POA: Diagnosis present

## 2024-05-15 DIAGNOSIS — Z90710 Acquired absence of both cervix and uterus: Secondary | ICD-10-CM

## 2024-05-15 DIAGNOSIS — Z79899 Other long term (current) drug therapy: Secondary | ICD-10-CM

## 2024-05-15 DIAGNOSIS — K7460 Unspecified cirrhosis of liver: Secondary | ICD-10-CM | POA: Diagnosis present

## 2024-05-15 DIAGNOSIS — I959 Hypotension, unspecified: Secondary | ICD-10-CM | POA: Diagnosis present

## 2024-05-15 DIAGNOSIS — E039 Hypothyroidism, unspecified: Secondary | ICD-10-CM | POA: Diagnosis present

## 2024-05-15 DIAGNOSIS — K7689 Other specified diseases of liver: Secondary | ICD-10-CM | POA: Diagnosis not present

## 2024-05-15 DIAGNOSIS — Z8701 Personal history of pneumonia (recurrent): Secondary | ICD-10-CM | POA: Diagnosis not present

## 2024-05-15 DIAGNOSIS — R7989 Other specified abnormal findings of blood chemistry: Secondary | ICD-10-CM

## 2024-05-15 DIAGNOSIS — N390 Urinary tract infection, site not specified: Principal | ICD-10-CM | POA: Diagnosis present

## 2024-05-15 DIAGNOSIS — R7401 Elevation of levels of liver transaminase levels: Secondary | ICD-10-CM | POA: Diagnosis not present

## 2024-05-15 DIAGNOSIS — I714 Abdominal aortic aneurysm, without rupture, unspecified: Secondary | ICD-10-CM | POA: Diagnosis present

## 2024-05-15 DIAGNOSIS — R29818 Other symptoms and signs involving the nervous system: Secondary | ICD-10-CM | POA: Diagnosis not present

## 2024-05-15 DIAGNOSIS — R262 Difficulty in walking, not elsewhere classified: Secondary | ICD-10-CM | POA: Diagnosis present

## 2024-05-15 DIAGNOSIS — Z8719 Personal history of other diseases of the digestive system: Secondary | ICD-10-CM

## 2024-05-15 DIAGNOSIS — Z8249 Family history of ischemic heart disease and other diseases of the circulatory system: Secondary | ICD-10-CM | POA: Diagnosis not present

## 2024-05-15 DIAGNOSIS — I4510 Unspecified right bundle-branch block: Secondary | ICD-10-CM | POA: Diagnosis present

## 2024-05-15 DIAGNOSIS — K8020 Calculus of gallbladder without cholecystitis without obstruction: Secondary | ICD-10-CM | POA: Diagnosis not present

## 2024-05-15 DIAGNOSIS — R29898 Other symptoms and signs involving the musculoskeletal system: Secondary | ICD-10-CM

## 2024-05-15 DIAGNOSIS — Z66 Do not resuscitate: Secondary | ICD-10-CM | POA: Diagnosis present

## 2024-05-15 DIAGNOSIS — I69354 Hemiplegia and hemiparesis following cerebral infarction affecting left non-dominant side: Secondary | ICD-10-CM

## 2024-05-15 DIAGNOSIS — R609 Edema, unspecified: Secondary | ICD-10-CM | POA: Diagnosis not present

## 2024-05-15 DIAGNOSIS — E1169 Type 2 diabetes mellitus with other specified complication: Secondary | ICD-10-CM

## 2024-05-15 DIAGNOSIS — R2990 Unspecified symptoms and signs involving the nervous system: Secondary | ICD-10-CM

## 2024-05-15 DIAGNOSIS — I6782 Cerebral ischemia: Secondary | ICD-10-CM | POA: Diagnosis not present

## 2024-05-15 DIAGNOSIS — I6381 Other cerebral infarction due to occlusion or stenosis of small artery: Secondary | ICD-10-CM | POA: Diagnosis not present

## 2024-05-15 LAB — CBC WITH DIFFERENTIAL/PLATELET
Abs Immature Granulocytes: 0.03 K/uL (ref 0.00–0.07)
Basophils Absolute: 0 K/uL (ref 0.0–0.1)
Basophils Relative: 0 %
Eosinophils Absolute: 0.2 K/uL (ref 0.0–0.5)
Eosinophils Relative: 2 %
HCT: 36.5 % (ref 36.0–46.0)
Hemoglobin: 12 g/dL (ref 12.0–15.0)
Immature Granulocytes: 0 %
Lymphocytes Relative: 18 %
Lymphs Abs: 1.6 K/uL (ref 0.7–4.0)
MCH: 29.7 pg (ref 26.0–34.0)
MCHC: 32.9 g/dL (ref 30.0–36.0)
MCV: 90.3 fL (ref 80.0–100.0)
Monocytes Absolute: 1.1 K/uL — ABNORMAL HIGH (ref 0.1–1.0)
Monocytes Relative: 12 %
Neutro Abs: 6.1 K/uL (ref 1.7–7.7)
Neutrophils Relative %: 68 %
Platelets: 180 K/uL (ref 150–400)
RBC: 4.04 MIL/uL (ref 3.87–5.11)
RDW: 14.6 % (ref 11.5–15.5)
WBC: 9 K/uL (ref 4.0–10.5)
nRBC: 0 % (ref 0.0–0.2)

## 2024-05-15 NOTE — ED Provider Notes (Signed)
 [facility] Provider Note    Event Date/Time   First MD Initiated Contact with Patient 05/15/24 2303     (approximate)   History   Weakness   HPI Lauren Morris is a 89 y.o. female with extensive past medical history and who comes with DNR paperwork at bedside.  She is alert and oriented and her daughter is at bedside as well.  They report that she has been very weak today with increased fatigue and decreased ability to move her left leg.  They are uncertain exactly what time the symptoms started.  She felt relatively normal this morning at around 7 AM although she did feel tired or and not quite herself.  At some point over the course of the day she started to have more trouble ambulating.  She is usually able to ambulate by herself with a walker, but she was not able to do so at some point before 7 PM (about 4-1/2 hours ago), although the patient thinks it was earlier in the afternoon that she started having more difficulties using her left leg.  She said that it feels like she is tired, but also like her left leg is not listening to her.  She has had similar problems in the past when she has a urinary tract infection in the setting of multiple prior CVAs.  She typically does not have neurological deficits at baseline except when she has urinary tract infections.    She denies fever, chest pain, shortness of breath, nausea, vomiting, and abdominal pain.  She says she has not had much to eat or drink over the last couple of days but she has tried to drink more water today.  She has not been having any burning when she urinates but she usually does not even when she has urinary tract infections.     Physical Exam   Triage Vital Signs: ED Triage Vitals  Encounter Vitals Group     BP 05/15/24 2306 (!) 118/48     Girls Systolic BP Percentile --      Girls Diastolic BP Percentile --      Boys Systolic BP Percentile --      Boys Diastolic BP Percentile --      Pulse  Rate 05/15/24 2306 68     Resp 05/15/24 2306 20     Temp 05/15/24 2306 98.5 F (36.9 C)     Temp Source 05/15/24 2306 Oral     SpO2 05/15/24 2306 98 %     Weight 05/15/24 2309 67.1 kg (148 lb)     Height --      Head Circumference --      Peak Flow --      Pain Score 05/15/24 2309 0     Pain Loc --      Pain Education --      Exclude from Growth Chart --     Most recent vital signs: Vitals:   05/15/24 2306  BP: (!) 118/48  Pulse: 68  Resp: 20  Temp: 98.5 F (36.9 C)  SpO2: 98%    General: Alert and awake awake, no distress.  Appears chronically ill and elderly but is oriented and conversant. CV:  Good peripheral perfusion.  Normal heart sounds, regular rate and rhythm. Resp:  Normal effort. Speaking easily and comfortably, no accessory muscle usage nor intercostal retractions.  Lungs are clear to auscultation bilaterally. Abd:  No distention.  Soft with no tenderness to palpation of the  abdomen. Neuro:  No obvious facial droop.  Patient has equal grip strength bilaterally and equal arm strength.  She is able to raise her right leg off the bed and hold it.  However the left leg she says she cannot raise both because of pain in her hip which is chronic but also because she said that the muscles will not cooperate with her.  She is able to activate the muscles but they do not seem to be sufficient for her to raise of the bed.  She has no evidence of dysarthria nor aphasia.   ED Results / Procedures / Treatments   Labs (all labs ordered are listed, but only abnormal results are displayed) Labs Reviewed  LACTIC ACID, PLASMA  LACTIC ACID, PLASMA  COMPREHENSIVE METABOLIC PANEL WITH GFR  CBC WITH DIFFERENTIAL/PLATELET  URINALYSIS, W/ REFLEX TO CULTURE (INFECTION SUSPECTED)  TROPONIN T, HIGH SENSITIVITY     EKG  ED ECG REPORT I, Darleene Dome, the attending physician, personally viewed and interpreted this ECG.  Date: 05/15/2024 EKG Time: 23: 12 Rate: 66 Rhythm: normal  sinus rhythm QRS Axis: normal Intervals: Right bundle branch block ST/T Wave abnormalities: Non-specific ST segment / T-wave changes, but no clear evidence of acute ischemia. Narrative Interpretation: no definitive evidence of acute ischemia; does not meet STEMI criteria.    RADIOLOGY See ED course for details   PROCEDURES:  Critical Care performed: {CriticalCareYesNo:19197::Yes, see critical care procedure note(s),No}  Procedures    IMPRESSION / MDM / ASSESSMENT AND PLAN / ED COURSE  I reviewed the triage vital signs and the nursing notes.                              Differential diagnosis includes, but is not limited to, CVA, recrudescence of CVA symptoms in the setting of an acute infection such as pneumonia or UTI, electrolyte or metabolic abnormality, renal dysfunction, less likely acute intracranial hemorrhage or neoplasm.  Patient's presentation is most consistent with acute presentation with potential threat to life or bodily function.  Labs/studies ordered: CBC with differential, high-sensitivity troponin, lactic acids, CMP, urinalysis, chest x-ray, MR brain  Interventions/Medications given:  Medications - No data to display  (Note:  hospital course my include additional interventions and/or labs/studies not listed above.)   Patient's vital signs are stable and within normal limits with no obvious septic/infectious signs or symptoms.  No respiratory symptoms and no abdominal pain.  However the left leg subacute neurosymptoms are worrisome.  NIH stroke scale is 1 and last known normal was greater then 4-1/2 hours ago with minimal symptoms.  She is not a candidate for thrombolytics and has no signs or symptoms suggestive of LVO.  I will proceed with a possible infectious workup given the possibility recrudescence of CVA symptoms as well as obtaining an MR brain.  I think that a CT scan would be very unlikely to be helpful for her.     Clinical Course as of  05/15/24 2354  Fri May 15, 2024  [facility]53 [facility] [place] [address] I independently viewed and interpret the patient's chest x-ray, and it appears that there is some bibasilar opacity.  However, the radiologist commented that it appears similar and unchanged from prior imaging and it is my opinion it likely does not reflect an acute infectious process, particularly given the fact that the patient has no leukocytosis and normal vital signs with no respiratory symptoms. [CF]    Clinical Course  User Index [CF] Gordan Huxley, MD     FINAL CLINICAL IMPRESSION(S) / ED DIAGNOSES   Final diagnoses:  None     Rx / DC Orders   ED Discharge Orders     None        Note:  This document was prepared using Dragon voice recognition software and may include unintentional dictation errors.

## 2024-05-15 NOTE — ED Triage Notes (Signed)
 Pt bib ems for c/o generalized weakness. Per EMS family stated that pt normally walks on her own with a walker but has not been doing so today. Pt not eating and drinking as normal

## 2024-05-16 ENCOUNTER — Emergency Department

## 2024-05-16 DIAGNOSIS — Z8249 Family history of ischemic heart disease and other diseases of the circulatory system: Secondary | ICD-10-CM | POA: Diagnosis not present

## 2024-05-16 DIAGNOSIS — I959 Hypotension, unspecified: Secondary | ICD-10-CM | POA: Diagnosis present

## 2024-05-16 DIAGNOSIS — E119 Type 2 diabetes mellitus without complications: Secondary | ICD-10-CM | POA: Diagnosis present

## 2024-05-16 DIAGNOSIS — N390 Urinary tract infection, site not specified: Secondary | ICD-10-CM

## 2024-05-16 DIAGNOSIS — I69354 Hemiplegia and hemiparesis following cerebral infarction affecting left non-dominant side: Secondary | ICD-10-CM | POA: Diagnosis not present

## 2024-05-16 DIAGNOSIS — K7460 Unspecified cirrhosis of liver: Secondary | ICD-10-CM | POA: Diagnosis present

## 2024-05-16 DIAGNOSIS — Z888 Allergy status to other drugs, medicaments and biological substances status: Secondary | ICD-10-CM | POA: Diagnosis not present

## 2024-05-16 DIAGNOSIS — R29818 Other symptoms and signs involving the nervous system: Secondary | ICD-10-CM | POA: Diagnosis not present

## 2024-05-16 DIAGNOSIS — R531 Weakness: Secondary | ICD-10-CM

## 2024-05-16 DIAGNOSIS — Z79899 Other long term (current) drug therapy: Secondary | ICD-10-CM | POA: Diagnosis not present

## 2024-05-16 DIAGNOSIS — I6381 Other cerebral infarction due to occlusion or stenosis of small artery: Secondary | ICD-10-CM | POA: Diagnosis not present

## 2024-05-16 DIAGNOSIS — I714 Abdominal aortic aneurysm, without rupture, unspecified: Secondary | ICD-10-CM | POA: Diagnosis present

## 2024-05-16 DIAGNOSIS — Z90710 Acquired absence of both cervix and uterus: Secondary | ICD-10-CM | POA: Diagnosis not present

## 2024-05-16 DIAGNOSIS — Z8701 Personal history of pneumonia (recurrent): Secondary | ICD-10-CM | POA: Diagnosis not present

## 2024-05-16 DIAGNOSIS — Z66 Do not resuscitate: Secondary | ICD-10-CM | POA: Diagnosis present

## 2024-05-16 DIAGNOSIS — E039 Hypothyroidism, unspecified: Secondary | ICD-10-CM | POA: Diagnosis present

## 2024-05-16 DIAGNOSIS — Z7982 Long term (current) use of aspirin: Secondary | ICD-10-CM | POA: Diagnosis not present

## 2024-05-16 DIAGNOSIS — I6782 Cerebral ischemia: Secondary | ICD-10-CM | POA: Diagnosis not present

## 2024-05-16 DIAGNOSIS — Z8616 Personal history of COVID-19: Secondary | ICD-10-CM | POA: Diagnosis not present

## 2024-05-16 DIAGNOSIS — E0500 Thyrotoxicosis with diffuse goiter without thyrotoxic crisis or storm: Secondary | ICD-10-CM | POA: Diagnosis present

## 2024-05-16 DIAGNOSIS — R262 Difficulty in walking, not elsewhere classified: Secondary | ICD-10-CM | POA: Diagnosis present

## 2024-05-16 DIAGNOSIS — I10 Essential (primary) hypertension: Secondary | ICD-10-CM | POA: Diagnosis present

## 2024-05-16 DIAGNOSIS — I610 Nontraumatic intracerebral hemorrhage in hemisphere, subcortical: Secondary | ICD-10-CM | POA: Diagnosis not present

## 2024-05-16 DIAGNOSIS — I4510 Unspecified right bundle-branch block: Secondary | ICD-10-CM | POA: Diagnosis present

## 2024-05-16 HISTORY — DX: Urinary tract infection, site not specified: N39.0

## 2024-05-16 LAB — LACTIC ACID, PLASMA: Lactic Acid, Venous: 0.9 mmol/L (ref 0.5–1.9)

## 2024-05-16 LAB — URINALYSIS, W/ REFLEX TO CULTURE (INFECTION SUSPECTED)
Bilirubin Urine: NEGATIVE
Glucose, UA: NEGATIVE mg/dL
Hgb urine dipstick: NEGATIVE
Ketones, ur: NEGATIVE mg/dL
Leukocytes,Ua: NEGATIVE
Nitrite: POSITIVE — AB
Protein, ur: NEGATIVE mg/dL
Specific Gravity, Urine: 1.016 (ref 1.005–1.030)
pH: 5 (ref 5.0–8.0)

## 2024-05-16 LAB — COMPREHENSIVE METABOLIC PANEL WITH GFR
ALT: 114 U/L — ABNORMAL HIGH (ref 0–44)
AST: 130 U/L — ABNORMAL HIGH (ref 15–41)
Albumin: 3.5 g/dL (ref 3.5–5.0)
Alkaline Phosphatase: 852 U/L — ABNORMAL HIGH (ref 38–126)
Anion gap: 9 (ref 5–15)
BUN: 26 mg/dL — ABNORMAL HIGH (ref 8–23)
CO2: 21 mmol/L — ABNORMAL LOW (ref 22–32)
Calcium: 9.1 mg/dL (ref 8.9–10.3)
Chloride: 104 mmol/L (ref 98–111)
Creatinine, Ser: 0.95 mg/dL (ref 0.44–1.00)
GFR, Estimated: 57 mL/min — ABNORMAL LOW (ref >60)
Glucose, Bld: 135 mg/dL — ABNORMAL HIGH (ref 70–99)
Potassium: 4.5 mmol/L (ref 3.5–5.1)
Sodium: 134 mmol/L — ABNORMAL LOW (ref 135–145)
Total Bilirubin: 1.3 mg/dL — ABNORMAL HIGH (ref 0.0–1.2)
Total Protein: 7.2 g/dL (ref 6.5–8.1)

## 2024-05-16 LAB — TROPONIN T, HIGH SENSITIVITY: Troponin T High Sensitivity: <15 ng/L (ref 0–19)

## 2024-05-16 MED ORDER — TIMOLOL MALEATE 0.5 % OP SOLN
1.0000 [drp] | Freq: Every day | OPHTHALMIC | Status: DC
  Administered 2024-05-16 – 2024-05-18 (×3): 1 [drp] via OPHTHALMIC
  Filled 2024-05-16: qty 5

## 2024-05-16 MED ORDER — ACETAMINOPHEN 325 MG PO TABS: 650.0000 mg | ORAL_TABLET | Freq: Four times a day (QID) | ORAL | Status: DC | PRN

## 2024-05-16 MED ORDER — ONDANSETRON HCL 4 MG PO TABS: 4.0000 mg | ORAL_TABLET | Freq: Four times a day (QID) | ORAL | Status: DC | PRN

## 2024-05-16 MED ORDER — ACETAMINOPHEN 650 MG RE SUPP
650.0000 mg | Freq: Four times a day (QID) | RECTAL | Status: DC | PRN
Start: 2024-05-16 — End: 2024-05-18

## 2024-05-16 MED ORDER — ONDANSETRON HCL 4 MG/2ML IJ SOLN: 4.0000 mg | Freq: Four times a day (QID) | INTRAMUSCULAR | Status: DC | PRN

## 2024-05-16 MED ORDER — SODIUM CHLORIDE 0.9 % IV BOLUS: 250.0000 mL | Freq: Once | INTRAVENOUS | Status: DC

## 2024-05-16 MED ORDER — SODIUM CHLORIDE 0.9 % IV BOLUS
500.0000 mL | Freq: Once | INTRAVENOUS | Status: AC
  Administered 2024-05-16: 500 mL via INTRAVENOUS

## 2024-05-16 MED ORDER — ENOXAPARIN SODIUM 40 MG/0.4ML IJ SOSY
40.0000 mg | PREFILLED_SYRINGE | INTRAMUSCULAR | Status: DC
  Administered 2024-05-16 – 2024-05-18 (×3): 40 mg via SUBCUTANEOUS
  Filled 2024-05-16 (×3): qty 0.4

## 2024-05-16 MED ORDER — SODIUM CHLORIDE 0.9 % IV SOLN
1.0000 g | INTRAVENOUS | Status: AC
  Administered 2024-05-16: 1 g via INTRAVENOUS
  Filled 2024-05-16: qty 10

## 2024-05-16 MED ORDER — SODIUM CHLORIDE 0.9 % IV SOLN
1.0000 g | INTRAVENOUS | Status: DC
  Administered 2024-05-17 – 2024-05-18 (×2): 1 g via INTRAVENOUS
  Filled 2024-05-16 (×2): qty 10

## 2024-05-16 NOTE — Assessment & Plan Note (Addendum)
 Ambulatory dysfunction-uses walker at baseline History of CVA with residual left-sided weakness MRI brain negative for acute stroke Possibly having recrudescence of LE weakness PT OT eval

## 2024-05-16 NOTE — H&P (Signed)
 History and Physical    Patient: Lauren Morris FMW:[id number] DOB: 1931/06/29 DOA: 05/15/2024 DOS: the patient was seen and examined on 05/16/2024 PCP: Marylynn Verneita CROME, MD  Patient coming from: Home  Chief Complaint:  Chief Complaint  Patient presents with   Weakness    HPI: Lauren Morris is a 89 y.o. 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  She initially presented with generalized weakness, and worsening of her baseline left lower extremity weakness with inability to ambulate with a walker , get up off the commode.  She denies abdominal pain, vomiting or dysuria. Of note, patient was seen by vascular surgery a month ago (04/17/2024) for evaluation of her AAA.  Aortic duplex showed unknown focal dissection consistent with prior studies. In the ED, BP somewhat soft at 100/49 with otherwise normal vitals. WBC 9 and lactic acid 0.9.  Urinalysis with positive nitrites, many bacteria and negative leuks. CMP at baseline with chronic LFT elevation Troponin less than 50 EKG G showed sinus at 66 with RBBB Chest x-ray nonacute showing stable small bilateral pleural effusions MRI brain showed remote right basal ganglia infarct  Patient started on Rocephin  Admission requested    Past Medical History:  Diagnosis Date   Community acquired pneumonia 04/05/2016   Cyst    hx o on right breast   DJD (degenerative joint disease)    right knee   Glaucoma    Hypertension    Pneumonia due to COVID-19 virus 12/12/2020   Septic shock (HCC) 10/14/2023   SIRS (systemic inflammatory response syndrome) (HCC) 10/10/2022   Sleep apnea    Thyroid  disease    UTI (urinary tract infection) 11/12/2021   Past Surgical History:  Procedure Laterality Date   ABDOMINAL  HYSTERECTOMY     VITRECTOMY  July 2012   right eye with membrane peel [place], [place])   Social History:  reports that she has never smoked. She has never used smokeless tobacco. She reports that she does not drink alcohol  and does not use drugs.  Allergies  Allergen Reactions   Augmentin  [Amoxicillin -Pot Clavulanate] Rash    Facial rash during hospital stay 09/2023    Family History  Problem Relation Age of Onset   Cancer Father    Hypertension Father    Heart attack Mother    Hypertension Mother     Prior to Admission medications   Medication Sig Start Date End Date Taking? Authorizing Provider  albuterol  (PROVENTIL ) (2.5 MG/3ML) 0.083% nebulizer solution Take 3 mLs (2.5 mg total) by nebulization every 6 (six) hours as needed for wheezing or shortness of breath. 10/13/22   Jhonny Calvin NOVAK, MD  ANORO ELLIPTA  62.5-25 MCG/ACT AEPB TAKE 1 PUFF BY MOUTH EVERY DAY 10/24/23   Marylynn Verneita CROME, MD  Ascorbic Acid  (VITAMIN C ) 1000 MG tablet Take 1 tablet (1,000 mg total) by mouth daily. 09/04/21   Marylynn Verneita CROME, MD  Aspirin  81 MG CAPS Take 1 capsule by mouth daily at 6 (six) AM.    [provider]  atenolol  (TENORMIN ) 25 MG tablet Take 1 tablet (25 mg total) by mouth 2 (two) times daily. 10/22/23   Von Bellis, MD  atorvastatin  (LIPITOR) 40 MG tablet TAKE ONE TABLET BY MOUTH ONCE DAILY 04/22/24   Marylynn Verneita CROME, MD  Calcium  Carb-Cholecalciferol (CALCIUM  1000 +  D PO) Take 1 tablet by mouth daily.    [provider]  Cenegermin-bkbj (OXERVATE) 0.002 % SOLN Apply 1 drop to eye 6 (six) times daily.    [provider]  famotidine  (PEPCID ) 20 MG tablet Take 1 tablet (20 mg total) by mouth at bedtime. 10/22/23   Von Bellis, MD  fish oil-omega-3 fatty acids  1000 MG capsule Take 2 capsules (2 g total) by mouth daily. 09/04/21   Tullo, Teresa L, MD  Multiple Vitamin (MULTIVITAMIN WITH MINERALS) TABS tablet Take 1 tablet by mouth daily. 11/27/20   Regalado, Belkys A, MD   RESTASIS  0.05 % ophthalmic emulsion Place 1 drop into both eyes 2 (two) times daily. 10/09/22   [provider]  spironolactone  (ALDACTONE ) 25 MG tablet Take 1 tablet (25 mg total) by mouth daily. 12/19/23   Marylynn Verneita CROME, MD  telmisartan  (MICARDIS ) 20 MG tablet Take 1 tablet (20 mg total) by mouth at bedtime. 02/03/24   Marylynn Verneita CROME, MD  timolol  (TIMOPTIC ) 0.5 % ophthalmic solution SMARTSIG:In Eye(s) 08/06/23   [provider]  torsemide  (DEMADEX ) 20 MG tablet Take 1 tablet (20 mg total) by mouth daily. 12/13/23   Dineen Rollene MATSU, FNP  zinc  sulfate 220 (50 Zn) MG capsule Take 1 capsule (220 mg total) by mouth daily. 09/04/21   Marylynn Verneita CROME, MD    Physical Exam: Vitals:   05/15/24 2306 05/15/24 2309 05/16/24 0215  BP: (!) 118/48  (!) 100/49  Pulse: 68  66  Resp: 20  18  Temp: 98.5 F (36.9 C)    TempSrc: Oral    SpO2: 98%  99%  Weight:  67.1 kg    Physical Exam Vitals and nursing note reviewed.  Constitutional:      General: She is not in acute distress.    Comments: Frail appearing elderly female  HENT:     Head: Normocephalic and atraumatic.  Cardiovascular:     Rate and Rhythm: Normal rate and regular rhythm.     Heart sounds: Normal heart sounds.  Pulmonary:     Effort: Pulmonary effort is normal.     Breath sounds: Normal breath sounds.  Abdominal:     Palpations: Abdomen is soft.     Tenderness: There is no abdominal tenderness.  Neurological:     Mental Status: Mental status is at baseline.     Labs on Admission: I have personally reviewed following labs and imaging studies  CBC: Recent Labs  Lab 05/15/24 2336  WBC 9.0  NEUTROABS 6.1  HGB 12.0  HCT 36.5  MCV 90.3  PLT 180   Basic Metabolic Panel: Recent Labs  Lab 05/15/24 2336  NA 134*  K 4.5  CL 104  CO2 21*  GLUCOSE 135*  BUN 26*  CREATININE 0.95  CALCIUM  9.1   GFR: Estimated Creatinine Clearance: 36.1 mL/min (by C-G formula based on SCr of 0.95 mg/dL). Liver  Function Tests: Recent Labs  Lab 05/15/24 2336  AST 130*  ALT 114*  ALKPHOS 852*  BILITOT 1.3*  PROT 7.2  ALBUMIN  3.5   No results for input(s): LIPASE, AMYLASE in the last 168 hours. No results for input(s): AMMONIA in the last 168 hours. Coagulation Profile: No results for input(s): INR, PROTIME in the last 168 hours. Cardiac Enzymes: No results for input(s): CKTOTAL, CKMB, CKMBINDEX, TROPONINI in the last 168 hours. BNP (last 3 results) No results for input(s): PROBNP in the last 8760 hours. HbA1C: No results for input(s): HGBA1C in the last 72  hours. CBG: No results for input(s): GLUCAP in the last 168 hours. Lipid Profile: No results for input(s): CHOL, HDL, LDLCALC, TRIG, CHOLHDL, LDLDIRECT in the last 72 hours. Thyroid  Function Tests: No results for input(s): TSH, T4TOTAL, FREET4, T3FREE, THYROIDAB in the last 72 hours. Anemia Panel: No results for input(s): VITAMINB12, FOLATE, FERRITIN, TIBC, IRON, RETICCTPCT in the last 72 hours. Urine analysis:    Component Value Date/Time   COLORURINE AMBER (A) 05/15/2024 2336   APPEARANCEUR HAZY (A) 05/15/2024 2336   LABSPEC 1.016 05/15/2024 2336   PHURINE 5.0 05/15/2024 2336   GLUCOSEU NEGATIVE 05/15/2024 2336   GLUCOSEU NEGATIVE 12/19/2023 1144   HGBUR NEGATIVE 05/15/2024 2336   BILIRUBINUR NEGATIVE 05/15/2024 2336   BILIRUBINUR negative 09/10/2023 1109   KETONESUR NEGATIVE 05/15/2024 2336   PROTEINUR NEGATIVE 05/15/2024 2336   UROBILINOGEN 0.2 12/19/2023 1144   NITRITE POSITIVE (A) 05/15/2024 2336   LEUKOCYTESUR NEGATIVE 05/15/2024 2336    Radiological Exams on Admission: MR BRAIN WO CONTRAST Result Date: 05/16/2024 EXAM: MRI Brain Without Contrast 05/16/2024 12:57:25 AM TECHNIQUE: Multiplanar multisequence MRI of the head/brain was performed without the administration of intravenous contrast. COMPARISON: MRI head October 19, 2023 CLINICAL HISTORY: Neuro deficit,  acute, stroke suspected FINDINGS: BRAIN AND VENTRICLES: No acute infarct. Remote right basal ganglia infarct with evidence of prior associated hemorrhage. No intracranial hemorrhage. No mass. No midline shift. No hydrocephalus. Similar chronic microvascular ischemic change and cerebral atrophy. Normal flow voids. ORBITS: No acute abnormality. SINUSES AND MASTOIDS: Left frontal sinus mucosal thickening. No mastoid effusions. BONES AND SOFT TISSUES: Normal marrow signal. No acute soft tissue abnormality. IMPRESSION: 1. No acute intracranial abnormality. 2. Remote right basal ganglia infarct. Electronically signed by: Gilmore Molt MD 05/16/2024 01:14 AM EST RP Workstation: [id number]   DG Chest Port 1 View Result Date: 05/15/2024 EXAM: 1 VIEW(S) XRAY OF THE CHEST 05/15/2024 11:36:00 PM COMPARISON: 12/10/2023 CLINICAL HISTORY: Questionable sepsis - evaluate for abnormality. FINDINGS: LINES, TUBES AND DEVICES: Central venous catheter is present. LUNGS AND PLEURA: Small bilateral pleural effusions with basilar atelectasis or infiltration. Emphysematous changes in the upper lungs. This is similar to the previous study, allowing for differences in technique. No pneumothorax. HEART AND MEDIASTINUM: The visualized heart size appears normal. No acute abnormality of the mediastinal silhouette. BONES AND SOFT TISSUES: Degenerative changes in the spine and shoulders. IMPRESSION: 1. Small bilateral pleural effusions with basilar atelectasis or infiltration, similar to the previous study, allowing for differences in technique. 2. Emphysematous changes in the upper lungs. Electronically signed by: Elsie Gravely MD 05/15/2024 11:39 PM EST RP Workstation: [id number]   Data Reviewed for HPI: Relevant notes from primary care and specialist visits, past discharge summaries as available in EHR, including Care Everywhere. Prior diagnostic testing as pertinent to current admission diagnoses Updated medications and problem  lists for reconciliation ED course, including vitals, labs, imaging, treatment and response to treatment Triage notes, nursing and pharmacy notes and ED provider's notes Notable results as noted above in HPI      Assessment and Plan: * Urinary tract infection Hypotension, sepsis not suspected Rocephin  and follow cultures Gentle IV hydration  Generalized weakness Ambulatory dysfunction-uses walker at baseline History of CVA with residual left-sided weakness MRI brain negative for acute stroke Possibly having recrudescence of LE weakness PT OT eval  Hypotension Possibly secondary to antihypertensives and mild dehydration from decreased oral intake Will hold atenolol , spironolactone , torsemide , telmisartan  Gentle fluid boluses as needed Can consider midodrine   Cirrhosis, nonalcoholic (HCC) Holding spironolactone  and torsemide  due to soft  blood pressures May consider midodrine   Type 2 diabetes mellitus (HCC) Sliding scale insulin  coverage  Acquired hypothyroidism History of Graves' disease Holding atenolol  due to soft blood pressure    DVT prophylaxis: Lovenox   Consults: none  Advance Care Planning:   Code Status: Limited: Do not attempt resuscitation (DNR) -DNR-LIMITED -Do Not Intubate/DNI    Family Communication: daughter at bedside  Disposition Plan: Back to previous home environment  Severity of Illness: The appropriate patient status for this patient is OBSERVATION. Observation status is judged to be reasonable and necessary in order to provide the required intensity of service to ensure the patient's safety. The patient's presenting symptoms, physical exam findings, and initial radiographic and laboratory data in the context of their medical condition is felt to place them at decreased risk for further clinical deterioration. Furthermore, it is anticipated that the patient will be medically stable for discharge from the hospital within 2 midnights of admission.    Author: Delayne LULLA Solian, MD 05/16/2024 2:56 AM  For on call review [email or web address].

## 2024-05-16 NOTE — Assessment & Plan Note (Addendum)
 Possibly secondary to antihypertensives and mild dehydration from decreased oral intake Will hold atenolol , spironolactone , torsemide , telmisartan  Gentle fluid boluses as needed Can consider midodrine 

## 2024-05-16 NOTE — Assessment & Plan Note (Signed)
 Sliding scale insulin  coverage

## 2024-05-16 NOTE — Assessment & Plan Note (Signed)
 Holding spironolactone  and torsemide  due to soft blood pressures May consider midodrine 

## 2024-05-16 NOTE — Progress Notes (Signed)
 Progress Note   Patient: Lauren Morris FMW:[id number] DOB: [birth year] DOA: 05/15/2024     0 DOS: the patient was seen and examined on 05/16/2024   Brief hospital course:  From HPI Lauren Morris is a 89 y.o. 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  She initially presented with generalized weakness, and worsening of her baseline left lower extremity weakness with inability to ambulate with a walker , get up off the commode.  She denies abdominal pain, vomiting or dysuria. Of note, patient was seen by vascular surgery a month ago (04/17/2024) for evaluation of her AAA.  Aortic duplex showed unknown focal dissection consistent with prior studies. In the ED, BP somewhat soft at 100/49 with otherwise normal vitals. WBC 9 and lactic acid 0.9.  Urinalysis with positive nitrites, many bacteria and negative leuks. CMP at baseline with chronic LFT elevation Troponin less than 50 EKG G showed sinus at 66 with RBBB Chest x-ray nonacute showing stable small bilateral pleural effusions MRI brain showed remote right basal ganglia infarct   Patient started on Rocephin  Admission requested   Assessment and Plan:  Urinary tract infection Hypotension, sepsis not suspected Continue ceftriaxone  Follow-up on culture results  Generalized weakness Ambulatory dysfunction-uses walker at baseline History of CVA with residual left-sided weakness MRI brain negative for acute stroke Possibly having recrudescence of LE weakness PT OT eval   Hypotension Possibly secondary to antihypertensives and mild dehydration from decreased oral intake Will hold atenolol , spironolactone , torsemide , telmisartan  Gentle fluid boluses as needed Can consider midodrine    Cirrhosis,  nonalcoholic (HCC) Transaminitis Holding spironolactone  and torsemide  due to soft blood pressures Liver ultrasound ordered   Type 2 diabetes mellitus (HCC) Sliding scale insulin  coverage   Acquired hypothyroidism History of Graves' disease Holding atenolol  due to soft blood pressure   DVT prophylaxis: Lovenox    Consults: none   Advance Care Planning:   Code Status: Limited: Do not attempt resuscitation (DNR) -DNR-LIMITED -Do Not Intubate/DNI     Family Communication: daughter at bedside   Disposition Plan: Back to previous home environment    Subjective:  Patient seen and examined at bedside this morning Denies nausea or vomiting abdominal pain chest pain   Physical Exam: Vitals and nursing note reviewed.  Constitutional:      General: She is not in acute distress.    Comments: Frail appearing elderly female  HENT:     Head: Normocephalic and atraumatic.  Cardiovascular:     Rate and Rhythm: Normal rate and regular rhythm.     Heart sounds: Normal heart sounds.  Pulmonary:     Effort: Pulmonary effort is normal.     Breath sounds: Normal breath sounds.  Abdominal:     Palpations: Abdomen is soft.     Tenderness: There is no abdominal tenderness.  Neurological:     Mental Status: Mental status is at baseline.   Vitals:   05/16/24 1115 05/16/24 1130 05/16/24 1200 05/16/24 1354  BP: 107/71 (!) 115/51 (!) 133/104 (!) 119/51  Pulse: 64 62 72 (!) 57  Resp: (!) 21 (!) 24 16   Temp:    98.8 F (37.1 C)  TempSrc:    Oral  SpO2: 100% 100% 94% 99%  Weight:        Data Reviewed: MRI of the brain reviewed that  did not show any acute finding except for remote infarct    Latest Ref Rng & Units 05/15/2024   11:36 PM 10/22/2023    4:42 AM 10/21/2023    4:35 AM  CBC  WBC 4.0 - 10.5 K/uL 9.0  9.1  8.6   Hemoglobin 12.0 - 15.0 g/dL 87.9  85.6  86.0   Hematocrit 36.0 - 46.0 % 36.5  42.5  40.7   Platelets 150 - 400 K/uL 180  218  200        Latest Ref Rng & Units  05/15/2024   11:36 PM 12/19/2023   11:44 AM 12/10/2023   12:39 PM  BMP  Glucose 70 - 99 mg/dL 864  90  83   BUN 8 - 23 mg/dL 26  22  18    Creatinine 0.44 - 1.00 mg/dL 9.04  9.08  9.24   Sodium 135 - 145 mmol/L 134  134  136   Potassium 3.5 - 5.1 mmol/L 4.5  4.6  4.3   Chloride 98 - 111 mmol/L 104  102  103   CO2 22 - 32 mmol/L 21  26  26    Calcium  8.9 - 10.3 mg/dL 9.1  9.8  9.5       Author: Drue ONEIDA Potter, MD 05/16/2024 3:37 PM  For on call review [email or web address].

## 2024-05-16 NOTE — Assessment & Plan Note (Addendum)
 Hypotension, sepsis not suspected Rocephin  and follow cultures Gentle IV hydration

## 2024-05-16 NOTE — Evaluation (Signed)
 Physical Therapy Evaluation Patient Details Name: Lauren Morris MRN: [id number] DOB: 09-18-1931 Today's Date: 05/16/2024  History of Present Illness  Lauren Morris is a 89 y.o. 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  She initially presented with generalized weakness, and worsening of her baseline left lower extremity weakness with inability to ambulate with a walker , get up off the commode.  She denies abdominal pain, vomiting or dysuria.  Clinical Impression  Patient noted to be in supine position at PT arrival in room, for an initial PT evaluation due to a decline in functional status, with baseline mobility reported as modI, and currently requiring min/CGA for transfers and ambulation. The patient is A&O x 4, presenting with good willingness to work with PT and goals of going home, with discharge expectations that include HHPT. The patient resides in a house and lives with family/friend support. There are no STE inside the residence.  Pt mildly orthostatic but improved with mobility with an SpO? of 95% on RA. Gait was assessed with RW. Gait mechanic observations noted: decreased speed with shuffled gait pattern. The overall clinical impression is that the patient presents with mild to moderate mobility limitations secondary to UTI. Recommended skilled PT will address safety, mobility, and discharge planning. PT recommendation to d/c patient to HHPT upon medical clearance.        If plan is discharge home, recommend the following: A little help with walking and/or transfers;A little help with bathing/dressing/bathroom;Help with stairs or ramp for entrance   Can travel by private vehicle        Equipment Recommendations    Recommendations for Other  Services       Functional Status Assessment Patient has had a recent decline in their functional status and/or demonstrates limited ability to make significant improvements in function in a reasonable and predictable amount of time     Precautions / Restrictions Precautions Precautions: Fall Restrictions Weight Bearing Restrictions Per Provider Order: No      Mobility  Bed Mobility Overal bed mobility: Needs Assistance Bed Mobility: Supine to Sit     Supine to sit: Min assist     General bed mobility comments: physcial assistance to help LE and positioning at EOB; slow to move but ony requires a little physical assistance when given time    Transfers Overall transfer level: Needs assistance Equipment used: Rolling walker (2 wheels) Transfers: Sit to/from Stand Sit to Stand: Min assist, From elevated surface, Contact guard assist           General transfer comment: requires light physical assistance although requires some vc provide on movement sequence and additional vc to maximize movement efficency; pt has slight dizzeness with initial standing vitals assessed and mildly orthostatic; Pt was provided with instructions for standing marches x5 each side and alternating shoulder flexion to increase BP and orthostatc reading improved    Ambulation/Gait Ambulation/Gait assistance: Contact guard assist, Min assist Gait Distance (Feet): 60 Feet Assistive device: Rolling walker (2 wheels) Gait Pattern/deviations: Step-through pattern, Narrow base of support, Trunk flexed, Shuffle Gait velocity: decreased     General Gait Details: pt slow and very cautious during ambulation bout; vitals remained stable with mild unsteadiness  Careers Information Officer  Tilt Bed    Modified Rankin (Stroke Patients Only)       Balance Overall balance assessment: Needs assistance Sitting-balance support: Bilateral upper extremity supported, Feet  supported Sitting balance-Leahy Scale: Good   Postural control: Posterior lean Standing balance support: During functional activity, Reliant on assistive device for balance Standing balance-Leahy Scale: Fair Standing balance comment: able to take lateral steps at bedside; supervision to get to chair in room after ambulation             High level balance activites: Direction changes, Side stepping, Turns High Level Balance Comments: Pt very cautious to gait transitions; no observabel LOB             Pertinent Vitals/Pain Pain Assessment Pain Assessment: No/denies pain    Home Living Family/patient expects to be discharged to:: Private residence Living Arrangements: Alone Available Help at Discharge: Family;Available PRN/intermittently Type of Home: House Home Access: Stairs to enter Entrance Stairs-Rails: Right Entrance Stairs-Number of Steps: 3   Home Layout: One level Home Equipment: Agricultural Consultant (2 wheels);Cane - single point;Shower seat - built in;BSC/3in1;Rollator (4 wheels) Additional Comments: Pt's family in room states that the caregiver is there eveyday and she occasional helps out in the afternoons    Prior Function Prior Level of Function : Independent/Modified Independent             Mobility Comments: Mod Ind amb with a SPC in the home and limited distances outside the home, uses a rollator for longer community mobility ADLs Comments: MOD I with ADL, assist for IADL such as for cooking and cleaning from family, does not drive     Extremity/Trunk Assessment        Lower Extremity Assessment Lower Extremity Assessment: Generalized weakness;LLE deficits/detail LLE Deficits / Details: CVA hx residual weakness on L side    Cervical / Trunk Assessment Cervical / Trunk Assessment: Kyphotic  Communication   Communication Communication: No apparent difficulties    Cognition Arousal: Alert Behavior During Therapy: WFL for tasks  assessed/performed   PT - Cognitive impairments: No apparent impairments                         Following commands: Intact       Cueing Cueing Techniques: Verbal cues     General Comments      Exercises     Assessment/Plan    PT Assessment Patient needs continued PT services  PT Problem List Decreased strength;Decreased range of motion;Decreased activity tolerance;Decreased balance;Decreased mobility       PT Treatment Interventions DME instruction;Gait training;Functional mobility training;Therapeutic activities;Therapeutic exercise;Manual techniques;Patient/family education;Neuromuscular re-education;Balance training    PT Goals (Current goals can be found in the Care Plan section)  Acute Rehab PT Goals Patient Stated Goal: Pt wants to feel better PT Goal Formulation: With patient/family Time For Goal Achievement: 06/13/24 Potential to Achieve Goals: Good    Frequency Min 2X/week     Co-evaluation PT/OT/SLP Co-Evaluation/Treatment: Yes Reason for Co-Treatment: For patient/therapist safety PT goals addressed during session: Mobility/safety with mobility OT goals addressed during session: ADL's and self-care       AM-PAC PT 6 Clicks Mobility  Outcome Measure Help needed turning from your back to your side while in a flat bed without using bedrails?: A Little Help needed moving from lying on your back to sitting on the side of a flat bed without using bedrails?: A Little Help needed moving to and from a bed to a chair (including  a wheelchair)?: A Little Help needed standing up from a chair using your arms (e.g., wheelchair or bedside chair)?: A Little Help needed to walk in hospital room?: A Little Help needed climbing 3-5 steps with a railing? : A Lot 6 Click Score: 17    End of Session Equipment Utilized During Treatment: Gait belt Activity Tolerance: Patient tolerated treatment well Patient left: in chair;with chair alarm set;with call  bell/phone within reach;with family/visitor present Nurse Communication: Mobility status PT Visit Diagnosis: Other abnormalities of gait and mobility (R26.89);Muscle weakness (generalized) (M62.81);Difficulty in walking, not elsewhere classified (R26.2)    Time: 8947-8864 PT Time Calculation (min) (ACUTE ONLY): 43 min   Charges:   PT Evaluation $PT Eval Low Complexity: 1 Low   PT General Charges $$ ACUTE PT VISIT: 1 Visit         Sherlean Lesches DPT, PT    Lauren Morris A Lauren Morris 05/16/2024, 1:05 PM

## 2024-05-16 NOTE — Assessment & Plan Note (Signed)
 History of Graves' disease Holding atenolol  due to soft blood pressure

## 2024-05-16 NOTE — ED Notes (Signed)
 Fall bundle in place.Nonskid socks & fall alert bracelet on pt. Bed alarm activated & audible.

## 2024-05-16 NOTE — Evaluation (Signed)
 Occupational Therapy Evaluation Patient Details Name: Lauren Morris MRN: [id number] DOB: 1931-08-10 Today's Date: 05/16/2024   History of Present Illness   Lauren Morris is a 89 y.o. 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  She initially presented with generalized weakness, and worsening of her baseline left lower extremity weakness with inability to ambulate with a walker , get up off the commode.  She denies abdominal pain, vomiting or dysuria.     Clinical Impressions Pt was seen for OT evaluation this date. Prior to hospital admission, pt was requiring assist for ADLs (bathing, LB dressing), aid assists with IADLs cooking and cleaning from family/aid. Pt uses a rollator at baseline. Pt has 24/7 care either from family or HH attendant. Pt lives with her daughter in a one level home with 3 steps to enter with a rail on the R side. Pt presents with mild deficits in decreased Ind in self care, balance, functional mobility/transfers, activity tolerance, and safety awareness affecting safe and optimal ADL completion. Pt currently requires MINA for bed mobility, MIN-CGA with use of RW for ambulation and simulated toilet tranfers during session. Pt reports inner ear deficits at baseline, pt states she is dizzy often. Mild orthostatics - Sitting BP: 112/67, standing 107/71. 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 Pt would benefit from skilled OT services to address noted impairments and functional limitations (see below for any additional details) in order to maximize safety and independence while minimizing future risk of falls, injury, and readmission. OT will follow acutely.      If plan is discharge home, recommend the following:   A little help with  walking and/or transfers;Assistance with cooking/housework;Help with stairs or ramp for entrance;Assist for transportation;A little help with bathing/dressing/bathroom     Functional Status Assessment   Patient has had a recent decline in their functional status and demonstrates the ability to make significant improvements in function in a reasonable and predictable amount of time.     Equipment Recommendations   None recommended by OT     Recommendations for Other Services         Precautions/Restrictions   Precautions Precautions: Fall Recall of Precautions/Restrictions: Intact Restrictions Weight Bearing Restrictions Per Provider Order: No     Mobility Bed Mobility Overal bed mobility: Needs Assistance Bed Mobility: Supine to Sit     Supine to sit: Min assist     General bed mobility comments: LE support and verbal cues for hand placement    Transfers Overall transfer level: Needs assistance Equipment used: Rolling walker (2 wheels) Transfers: Sit to/from Stand Sit to Stand: Min assist, From elevated surface, Contact guard assist           General transfer comment: Verbal cues for DME management and sequencing of technique      Balance Overall balance assessment: Needs assistance Sitting-balance support: Bilateral upper extremity supported, Feet supported Sitting balance-Leahy Scale: Good   Postural control: Posterior lean Standing balance support: During functional activity, Reliant on assistive device for balance Standing balance-Leahy Scale: Fair                             ADL either performed or assessed with clinical judgement   ADL Overall ADL's : Needs  assistance/impaired Eating/Feeding: Set up;Sitting   Grooming: Wash/dry face;Wash/dry hands;Set up;Sitting       Lower Body Bathing: Sit to/from stand;Minimal assistance   Upper Body Dressing : Minimal assistance;Sitting   Lower Body Dressing: Maximal assistance;Sit  to/from stand   Toilet Transfer: Ambulation;Rolling walker (2 wheels);Contact guard assist Toilet Transfer Details (indicate cue type and reason): Simulated           General ADL Comments: Eager to get out of bed, MAXA for LB dressing - near baseline for pt     Vision   Additional Comments: Pt daughter concerned about redness in pt's R eye. RN aware     Perception         Praxis         Pertinent Vitals/Pain Pain Assessment Pain Assessment: No/denies pain     Extremity/Trunk Assessment Upper Extremity Assessment Upper Extremity Assessment: Generalized weakness;LUE deficits/detail LUE Deficits / Details: Mild LUE weakness from hx CVA   Lower Extremity Assessment Lower Extremity Assessment: Defer to PT evaluation;LLE deficits/detail LLE Deficits / Details: CVA hx residual weakness on L side   Cervical / Trunk Assessment Cervical / Trunk Assessment: Kyphotic   Communication Communication Communication: No apparent difficulties   Cognition Arousal: Alert Behavior During Therapy: WFL for tasks assessed/performed Cognition: No apparent impairments                               Following commands: Intact       Cueing  General Comments   Cueing Techniques: Verbal cues  Pt reports inner ear issues at baseline, becomes dizzy when bending forwards   Exercises Exercises: Other exercises Other Exercises Other Exercises: Edu: Role of OT eval, discharge planning, safe ADL completion   Shoulder Instructions      Home Living Family/patient expects to be discharged to:: Private residence Living Arrangements: Alone Available Help at Discharge: Family;Available 24 hours/day;Personal care attendant Type of Home: House Home Access: Stairs to enter [employer] of Steps: 3 Entrance Stairs-Rails: Right Home Layout: One level     Bathroom Shower/Tub: Producer, Television/film/video: Standard     Home Equipment: Agricultural Consultant (2  wheels);Cane - single point;Shower seat - built in;BSC/3in1;Rollator (4 wheels)   Additional Comments: Pt's family at bedside states that the caregiver is there eveyday and she occasional helps out in the afternoons. Pt has 24/7 care.      Prior Functioning/Environment Prior Level of Function : Independent/Modified Independent             Mobility Comments: Mod Ind amb with a SPC in the home and limited distances outside the home, uses a rollator for longer community mobility ADLs Comments: Requires assist for ADLs (bathing, LB dressing), aid assists with IADLs cooking and cleaning from family/aid    OT Problem List: Decreased strength;Decreased activity tolerance;Impaired balance (sitting and/or standing);Decreased coordination;Decreased safety awareness;Decreased knowledge of use of DME or AE   OT Treatment/Interventions: Self-care/ADL training;Therapeutic exercise;Energy conservation;DME and/or AE instruction;Therapeutic activities;Patient/family education;Balance training      OT Goals(Current goals can be found in the care plan section)   Acute Rehab OT Goals Patient Stated Goal: Get stronger OT Goal Formulation: With patient/family Time For Goal Achievement: 05/30/24 Potential to Achieve Goals: Good ADL Goals Pt Will Perform Grooming: standing;with min assist Pt Will Perform Lower Body Dressing: sit to/from stand;with min assist Pt Will Transfer to Toilet: ambulating;with contact guard assist Pt Will Perform Toileting - Clothing  Manipulation and hygiene: sitting/lateral leans;with supervision   OT Frequency:  Min 2X/week    Co-evaluation PT/OT/SLP Co-Evaluation/Treatment: Yes Reason for Co-Treatment: For patient/therapist safety PT goals addressed during session: Mobility/safety with mobility OT goals addressed during session: ADL's and self-care      AM-PAC OT 6 Clicks Daily Activity     Outcome Measure Help from another person eating meals?: None Help from  another person taking care of personal grooming?: A Little Help from another person toileting, which includes using toliet, bedpan, or urinal?: A Little Help from another person bathing (including washing, rinsing, drying)?: A Lot Help from another person to put on and taking off regular upper body clothing?: A Little Help from another person to put on and taking off regular lower body clothing?: A Lot 6 Click Score: 17   End of Session Equipment Utilized During Treatment: Gait belt;Rolling walker (2 wheels) Nurse Communication: Mobility status;Other (comment) (Pt in the chair)  Activity Tolerance: Patient tolerated treatment well Patient left: in chair;with call bell/phone within reach;with family/visitor present  OT Visit Diagnosis: Unsteadiness on feet (R26.81);Other abnormalities of gait and mobility (R26.89);Muscle weakness (generalized) (M62.81);Dizziness and giddiness (R42)                Time: 8957-8863 OT Time Calculation (min): 54 min Charges:  OT General Charges $OT Visit: 1 Visit OT Evaluation $OT Eval Low Complexity: 1 Low OT Treatments $Self Care/Home Management : 8-22 mins  Larraine Colas M.S. OTR/L  05/16/24, 2:25 PM

## 2024-05-16 NOTE — Hospital Course (Signed)
 SABRA

## 2024-05-17 ENCOUNTER — Inpatient Hospital Stay

## 2024-05-17 ENCOUNTER — Encounter: Payer: Self-pay | Admitting: Internal Medicine

## 2024-05-17 DIAGNOSIS — N390 Urinary tract infection, site not specified: Secondary | ICD-10-CM | POA: Diagnosis not present

## 2024-05-17 LAB — CBC WITH DIFFERENTIAL/PLATELET
Abs Immature Granulocytes: 0.02 K/uL (ref 0.00–0.07)
Basophils Absolute: 0 K/uL (ref 0.0–0.1)
Basophils Relative: 1 %
Eosinophils Absolute: 0.3 K/uL (ref 0.0–0.5)
Eosinophils Relative: 5 %
HCT: 35.2 % — ABNORMAL LOW (ref 36.0–46.0)
Hemoglobin: 11.5 g/dL — ABNORMAL LOW (ref 12.0–15.0)
Immature Granulocytes: 0 %
Lymphocytes Relative: 27 %
Lymphs Abs: 1.7 K/uL (ref 0.7–4.0)
MCH: 29.6 pg (ref 26.0–34.0)
MCHC: 32.7 g/dL (ref 30.0–36.0)
MCV: 90.7 fL (ref 80.0–100.0)
Monocytes Absolute: 0.7 K/uL (ref 0.1–1.0)
Monocytes Relative: 11 %
Neutro Abs: 3.6 K/uL (ref 1.7–7.7)
Neutrophils Relative %: 56 %
Platelets: 163 K/uL (ref 150–400)
RBC: 3.88 MIL/uL (ref 3.87–5.11)
RDW: 14.6 % (ref 11.5–15.5)
WBC: 6.4 K/uL (ref 4.0–10.5)
nRBC: 0 % (ref 0.0–0.2)

## 2024-05-17 LAB — BASIC METABOLIC PANEL WITH GFR
Anion gap: 8 (ref 5–15)
BUN: 24 mg/dL — ABNORMAL HIGH (ref 8–23)
CO2: 22 mmol/L (ref 22–32)
Calcium: 8.7 mg/dL — ABNORMAL LOW (ref 8.9–10.3)
Chloride: 107 mmol/L (ref 98–111)
Creatinine, Ser: 0.86 mg/dL (ref 0.44–1.00)
GFR, Estimated: >60 mL/min (ref >60)
Glucose, Bld: 99 mg/dL (ref 70–99)
Potassium: 4.1 mmol/L (ref 3.5–5.1)
Sodium: 137 mmol/L (ref 135–145)

## 2024-05-17 MED ORDER — ORAL CARE MOUTH RINSE: 15.0000 mL | OROMUCOSAL | Status: DC | PRN

## 2024-05-17 NOTE — Plan of Care (Signed)
  Problem: Education: Goal: Knowledge of General Education information will improve Description: Including pain rating scale, medication(s)/side effects and non-pharmacologic comfort measures Outcome: Progressing   Problem: Clinical Measurements: Goal: Ability to maintain clinical measurements within normal limits will improve Outcome: Progressing   Problem: Activity: Goal: Risk for activity intolerance will decrease Outcome: Progressing   Problem: Health Behavior/Discharge Planning: Goal: Ability to manage health-related needs will improve Outcome: Not Progressing

## 2024-05-17 NOTE — Progress Notes (Signed)
 Physical Therapy Treatment Patient Details Name: LARAH KUNTZMAN MRN: [id number] DOB: 02/17/1932 Today's Date: 05/17/2024   History of Present Illness ELLISSA AYO is a 89 y.o. 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  She initially presented with generalized weakness, and worsening of her baseline left lower extremity weakness with inability to ambulate with a walker , get up off the commode.  She denies abdominal pain, vomiting or dysuria.    PT Comments  Pt in bed.  Reports inc fatigue this date and some increased LLE soreness that pt and daughter attribute to walking to far yesterday (60' per notes).  They state she was walking independently with rollator prior to decline/uti.  She is able to get to EOB with min a x 1, increased time and cues.  She tries to pull up on RW to stand and needs redirection to push from bed.  Stands and is able to transfer to [facility] to void, attend to needs and walk 20' in room with RW all with min a x 1.  Pt is generally slow with movements and does need light assist for all activities and unsafe at this time to walk or transfer unassisted.  She returns to bed and does not feel she can do more at this time.  Daughter in room and voices concerns over pt not at baseline and needing increased assist with limited mobility.  She reports she is moving slower and not as energetic as normal.  Stated she has been to rehab multiple times in the past and feels it may be necessary at discharge.  She does have +24 hour care between aide and herself available. Stated she does not think herself or aide can help as much as she needs given her PLOF and need for increased assist.   Will plan to see pt again tomorrow and adjust recommendations as appropriate.     If plan is discharge home, recommend the following: A little help with walking and/or transfers;A little help with bathing/dressing/bathroom;Help with stairs or ramp for entrance;Assistance with cooking/housework   Can travel by private Scientist, Research (medical) walker (2 wheels);BSC/3in1    Recommendations for Other Services       Precautions / Restrictions Precautions Precautions: Fall Recall of Precautions/Restrictions: Intact Restrictions Weight Bearing Restrictions Per Provider Order: No     Mobility  Bed Mobility Overal bed mobility: Needs Assistance Bed Mobility: Supine to Sit, Sit to Supine     Supine to sit: Min assist Sit to supine: Min assist   General bed mobility comments: LE support and verbal cues for hand placement Patient Response: Cooperative, Flat affect  Transfers Overall transfer level: Needs assistance Equipment used: Rolling walker (2 wheels) Transfers: Sit to/from Stand Sit to Stand: Min assist                Ambulation/Gait Ambulation/Gait assistance: Contact guard assist, Min assist Gait Distance (Feet): 20 Feet Assistive device: Rolling walker (2 wheels) Gait Pattern/deviations: Step-through pattern, Narrow base of support, Trunk flexed, Shuffle Gait velocity: decreased     General Gait Details: self limits due to fatigue and newer  L hip soreness   Stairs             Wheelchair Mobility  Tilt Bed Tilt Bed Patient Response: Cooperative, Flat affect  Modified Rankin (Stroke Patients Only)       Balance Overall balance assessment: Needs assistance Sitting-balance support: Bilateral upper extremity supported, Feet supported Sitting balance-Leahy Scale: Good     Standing balance support: During functional activity, Reliant on assistive device for balance Standing balance-Leahy Scale: Fair Standing balance comment: able to attend to her own self care needs with cga/min a x 1.                             Communication Communication Communication: No apparent difficulties  Cognition Arousal: Alert Behavior During Therapy: WFL for tasks assessed/performed   PT - Cognitive impairments: No apparent impairments                         Following commands: Intact      Cueing Cueing Techniques: Verbal cues  Exercises Other Exercises Other Exercises: to bSC to void    General Comments        Pertinent Vitals/Pain Pain Assessment Pain Assessment: Faces Faces Pain Scale: Hurts little more Pain Location: L  LE/hip Pain Descriptors / Indicators: Sore, Tightness Pain Intervention(s): Limited activity within patient's tolerance, Monitored during session, Repositioned    Home Living                          Prior Function            PT Goals (current goals can now be found in the care plan section) Progress towards PT goals: Progressing toward goals    Frequency    Min 2X/week      PT Plan      Co-evaluation              AM-PAC PT 6 Clicks Mobility   Outcome Measure  Help needed turning from your back to your side while in a flat bed without using bedrails?: A Little Help needed moving from lying on your back to sitting on the side of a flat bed without using bedrails?: A Little Help needed moving to and from a bed to a chair (including a wheelchair)?: A Little Help needed standing up from a chair using your arms (e.g., wheelchair or bedside chair)?: A Little Help needed to walk in hospital room?: A Little Help needed climbing 3-5 steps with a railing? : A Lot 6 Click Score: 17    End of Session Equipment Utilized During Treatment: Gait belt Activity Tolerance: Patient limited by fatigue Patient left: in bed;with bed alarm set;with chair alarm set;with family/visitor present Nurse Communication: Mobility status PT Visit Diagnosis: Other abnormalities of gait and mobility (R26.89);Muscle weakness  (generalized) (M62.81);Difficulty in walking, not elsewhere classified (R26.2)     Time: 8459-8442 PT Time Calculation (min) (ACUTE ONLY): 17 min  Charges:    $Gait Training: 8-22 mins PT General Charges $$ ACUTE PT VISIT: 1 Visit                   Lauraine Gills, PTA 05/17/24, 4:09 PM

## 2024-05-17 NOTE — Plan of Care (Signed)

## 2024-05-17 NOTE — Progress Notes (Signed)
 Progress Note   Patient: Lauren Morris FMW:[id number] DOB: 23-Oct-1931 DOA: 05/15/2024     1 DOS: the patient was seen and examined on 05/17/2024    Brief hospital course:   From HPI HALLELUJAH WYSONG is a 89 y.o. 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  She initially presented with generalized weakness, and worsening of her baseline left lower extremity weakness with inability to ambulate with a walker , get up off the commode.  She denies abdominal pain, vomiting or dysuria. Of note, patient was seen by vascular surgery a month ago (04/17/2024) for evaluation of her AAA.  Aortic duplex showed unknown focal dissection consistent with prior studies. In the ED, BP somewhat soft at 100/49 with otherwise normal vitals. WBC 9 and lactic acid 0.9.  Urinalysis with positive nitrites, many bacteria and negative leuks. CMP at baseline with chronic LFT elevation Troponin less than 50 EKG G showed sinus at 66 with RBBB Chest x-ray nonacute showing stable small bilateral pleural effusions MRI brain showed remote right basal ganglia infarct   Patient started on Rocephin  Admission requested    Assessment and Plan:   Urinary tract infection Hypotension, sepsis not suspected Continue ceftriaxone  Follow-up on culture results   Generalized weakness Ambulatory dysfunction-uses walker at baseline History of CVA with residual left-sided weakness MRI brain negative for acute stroke Possibly having recrudescence of LE weakness PT OT eval   Hypotension Possibly secondary to antihypertensives and mild dehydration from decreased oral intake Will hold atenolol , spironolactone , torsemide , telmisartan  Gentle fluid boluses as needed Can consider midodrine    Cirrhosis,  nonalcoholic (HCC) Transaminitis Holding spironolactone  and torsemide  due to soft blood pressures Liver ultrasound ordered   Type 2 diabetes mellitus (HCC) Sliding scale insulin  coverage   Acquired hypothyroidism History of Graves' disease Holding atenolol  due to soft blood pressure   DVT prophylaxis: Lovenox    Consults: none   Advance Care Planning:   Code Status: Limited: Do not attempt resuscitation (DNR) -DNR-LIMITED -Do Not Intubate/DNI     Family Communication: daughter at bedside   Disposition Plan: Back to previous home environment     Subjective:  Did not have any acute overnight event According to patient she is weak and does not have much help at home I discussed with patient's daughter over the phone and they will see how she does with PT OT today and tomorrow   Physical Exam: Vitals and nursing note reviewed.  Constitutional:      General: She is not in acute distress.    Comments: Frail appearing elderly female  HENT:     Head: Normocephalic and atraumatic.  Cardiovascular:     Rate and Rhythm: Normal rate and regular rhythm.     Heart sounds: Normal heart sounds.  Pulmonary:     Effort: Pulmonary effort is normal.     Breath sounds: Normal breath sounds.  Abdominal:     Palpations: Abdomen is soft.     Tenderness: There is no abdominal tenderness.  Neurological:     Mental Status: Mental status is at baseline.   Data Reviewed:     Latest Ref Rng & Units 05/17/2024    7:03 AM 05/15/2024   11:36 PM 12/19/2023   11:44 AM  BMP  Glucose 70 - 99 mg/dL 99  864  90  BUN 8 - 23 mg/dL 24  26  22    Creatinine 0.44 - 1.00 mg/dL 9.13  9.04  9.08   Sodium 135 - 145 mmol/L 137  134  134   Potassium 3.5 - 5.1 mmol/L 4.1  4.5  4.6   Chloride 98 - 111 mmol/L 107  104  102   CO2 22 - 32 mmol/L 22  21  26    Calcium  8.9 - 10.3 mg/dL 8.7  9.1  9.8        Latest Ref Rng & Units 05/17/2024    7:03 AM 05/15/2024   11:36 PM 10/22/2023    4:42 AM  CBC  WBC 4.0  - 10.5 K/uL 6.4  9.0  9.1   Hemoglobin 12.0 - 15.0 g/dL 88.4  87.9  85.6   Hematocrit 36.0 - 46.0 % 35.2  36.5  42.5   Platelets 150 - 400 K/uL 163  180  218      Vitals:   05/16/24 1619 05/16/24 2021 05/17/24 0456 05/17/24 0800  BP: (!) 121/43 (!) 116/59 (!) 109/55 (!) 105/45  Pulse: (!) 59 60 60 60  Resp: 16 15 15 16   Temp: 97.9 F (36.6 C) 97.7 F (36.5 C) 98 F (36.7 C) 97.9 F (36.6 C)  TempSrc:      SpO2: 100% 97% 94% 100%  Weight:         Author: Drue ONEIDA Potter, MD 05/17/2024 3:44 PM  For on call review [email or web address].

## 2024-05-18 ENCOUNTER — Telehealth: Payer: Self-pay

## 2024-05-18 DIAGNOSIS — N390 Urinary tract infection, site not specified: Secondary | ICD-10-CM | POA: Diagnosis not present

## 2024-05-18 LAB — CBC WITH DIFFERENTIAL/PLATELET
Abs Immature Granulocytes: 0.03 10*3/uL (ref 0.00–0.07)
Basophils Absolute: 0 10*3/uL (ref 0.0–0.1)
Basophils Relative: 1 %
Eosinophils Absolute: 0.4 10*3/uL (ref 0.0–0.5)
Eosinophils Relative: 6 %
HCT: 40 % (ref 36.0–46.0)
Hemoglobin: 12.6 g/dL (ref 12.0–15.0)
Immature Granulocytes: 0 %
Lymphocytes Relative: 26 %
Lymphs Abs: 1.8 10*3/uL (ref 0.7–4.0)
MCH: 28.7 pg (ref 26.0–34.0)
MCHC: 31.5 g/dL (ref 30.0–36.0)
MCV: 91.1 fL (ref 80.0–100.0)
Monocytes Absolute: 0.8 10*3/uL (ref 0.1–1.0)
Monocytes Relative: 11 %
Neutro Abs: 4 10*3/uL (ref 1.7–7.7)
Neutrophils Relative %: 56 %
Platelets: 194 10*3/uL (ref 150–400)
RBC: 4.39 MIL/uL (ref 3.87–5.11)
RDW: 14.5 % (ref 11.5–15.5)
WBC: 7.1 10*3/uL (ref 4.0–10.5)
nRBC: 0 % (ref 0.0–0.2)

## 2024-05-18 LAB — BASIC METABOLIC PANEL WITH GFR
Anion gap: 10 (ref 5–15)
BUN: 25 mg/dL — ABNORMAL HIGH (ref 8–23)
CO2: 20 mmol/L — ABNORMAL LOW (ref 22–32)
Calcium: 8.8 mg/dL — ABNORMAL LOW (ref 8.9–10.3)
Chloride: 106 mmol/L (ref 98–111)
Creatinine, Ser: 0.88 mg/dL (ref 0.44–1.00)
GFR, Estimated: >60 mL/min (ref >60)
Glucose, Bld: 119 mg/dL — ABNORMAL HIGH (ref 70–99)
Potassium: 4.5 mmol/L (ref 3.5–5.1)
Sodium: 136 mmol/L (ref 135–145)

## 2024-05-18 LAB — URINE CULTURE: Culture: >=100000 — AB

## 2024-05-18 MED ORDER — CEPHALEXIN 500 MG PO CAPS: 500.0000 mg | ORAL_CAPSULE | Freq: Two times a day (BID) | ORAL | 0 refills | Status: AC

## 2024-05-18 NOTE — Progress Notes (Signed)
 Physical Therapy Treatment Patient Details Name: Lauren Morris MRN: [id number] DOB: 1932/05/05 Today's Date: 05/18/2024   History of Present Illness Lauren Morris is a 89 y.o. 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  She initially presented with generalized weakness, and worsening of her baseline left lower extremity weakness with inability to ambulate with a walker , get up off the commode.  She denies abdominal pain, vomiting or dysuria.    PT Comments  Pt on BSC.    Pt is able to stand and attend to her own needs.  She is able to progress gait 87' with RW and generally steady gait.  She does state she is a bit slower than her baseline put improved over yesterday.  MD discussed with daughter and ok to return home today.     If plan is discharge home, recommend the following: A little help with walking and/or transfers;A little help with bathing/dressing/bathroom;Help with stairs or ramp for entrance;Assistance with cooking/housework   Can travel by private Automotive Engineer (2 wheels);BSC/3in1    Recommendations for Other Services       Precautions / Restrictions Precautions Precautions: Fall Recall of Precautions/Restrictions: Intact Restrictions Weight Bearing Restrictions Per Provider Order: No     Mobility  Bed Mobility   Bed Mobility: Sit to Supine       Sit to supine: Min assist     Patient Response: Cooperative  Transfers Overall transfer level: Needs assistance Equipment used: Rolling walker (2 wheels) Transfers: Sit to/from Stand Sit to Stand: Contact guard assist                Ambulation/Gait Ambulation/Gait assistance: Contact guard assist, Min assist Gait Distance  (Feet): 80 Feet Assistive device: Rolling walker (2 wheels) Gait Pattern/deviations: Step-through pattern, Narrow base of support, Trunk flexed, Shuffle Gait velocity: decreased     General Gait Details: improved today with good balance   Stairs             Wheelchair Mobility     Tilt Bed Tilt Bed Patient Response: Cooperative  Modified Rankin (Stroke Patients Only)       Balance Overall balance assessment: Needs assistance Sitting-balance support: Bilateral upper extremity supported, Feet supported Sitting balance-Leahy Scale: Good     Standing balance support: During functional activity, Reliant on assistive device for balance Standing balance-Leahy Scale: Fair                              Hotel Manager: No apparent difficulties  Cognition Arousal: Alert Behavior During Therapy: WFL for tasks assessed/performed   PT - Cognitive impairments: No apparent impairments                         Following commands: Intact      Cueing Cueing Techniques: Verbal cues  Exercises Other Exercises Other Exercises: to bSC to void    General Comments        Pertinent Vitals/Pain Pain Assessment Pain Assessment: Faces Faces Pain Scale: Hurts a little bit Pain Location: L  LE/hip Pain Descriptors / Indicators: Sore, Tightness Pain Intervention(s): Limited activity within patient's tolerance, Monitored during session, Repositioned  Home Living                          Prior Function            PT Goals (current goals can now be found in the care plan section) Progress towards PT goals: Progressing toward goals    Frequency    Min 2X/week      PT Plan      Co-evaluation              AM-PAC PT 6 Clicks Mobility   Outcome Measure  Help needed turning from your back to your side while in a flat bed without using bedrails?: A Little Help needed moving from lying on your back to  sitting on the side of a flat bed without using bedrails?: A Little Help needed moving to and from a bed to a chair (including a wheelchair)?: A Little Help needed standing up from a chair using your arms (e.g., wheelchair or bedside chair)?: A Little Help needed to walk in hospital room?: A Little Help needed climbing 3-5 steps with a railing? : A Lot 6 Click Score: 17    End of Session Equipment Utilized During Treatment: Gait belt Activity Tolerance: Patient limited by fatigue Patient left: in bed;with bed alarm set;with chair alarm set;with family/visitor present Nurse Communication: Mobility status PT Visit Diagnosis: Other abnormalities of gait and mobility (R26.89);Muscle weakness (generalized) (M62.81);Difficulty in walking, not elsewhere classified (R26.2)     Time: 8942-8888 PT Time Calculation (min) (ACUTE ONLY): 14 min  Charges:    $Gait Training: 8-22 mins PT General Charges $$ ACUTE PT VISIT: 1 Visit                   Lauraine Gills, PTA 05/18/24, 11:55 AM

## 2024-05-18 NOTE — Discharge Summary (Signed)
 Physician Discharge Summary   Patient: Lauren Morris MRN: [id number] DOB: [birth year]  Admit date:     05/15/2024  Discharge date: 05/18/24  Discharge Physician: Drue ONEIDA Potter   PCP: Marylynn Verneita CROME, MD   Recommendations at discharge:  Follow-up with PCP  Discharge Diagnoses: Principal Problem:   Urinary tract infection Active Problems:   Generalized weakness   Hypotension   Essential hypertension   Acquired hypothyroidism   Type 2 diabetes mellitus (HCC)   Cirrhosis, nonalcoholic (HCC)  Resolved Problems:   * No resolved hospital problems. [facility] Course:  From HPI Lauren Morris is a 89 y.o. 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  She initially presented with generalized weakness, and worsening of her baseline left lower extremity weakness with inability to ambulate with a walker , get up off the commode.  She denies abdominal pain, vomiting or dysuria. Of note, patient was seen by vascular surgery a month ago (04/17/2024) for evaluation of her AAA.  Aortic duplex showed unknown focal dissection consistent with prior studies. In the ED, BP somewhat soft at 100/49 with otherwise normal vitals. WBC 9 and lactic acid 0.9.  Urinalysis with positive nitrites, many bacteria and negative leuks. CMP at baseline with chronic LFT elevation Troponin less than 50 EKG G showed sinus at 66 with RBBB Chest x-ray nonacute showing stable small bilateral pleural effusions MRI brain showed remote right basal ganglia infarct   Patient started on Rocephin  Admission requested    Assessment and Plan:   Urinary tract infection Hypotension, sepsis not suspected Patient received ceftriaxone  which has been switched to Keflex    Generalized  weakness Ambulatory dysfunction-uses walker at baseline History of CVA with residual left-sided weakness MRI brain negative for acute stroke Possibly having recrudescence of LE weakness PT OT eval   Hypotension-improved Will hold atenolol , spironolactone , torsemide , telmisartan     Cirrhosis, nonalcoholic (HCC) Transaminitis Holding spironolactone  and torsemide  due to soft blood pressures   Type 2 diabetes mellitus (HCC) Sliding scale insulin  coverage   Acquired hypothyroidism History of Graves' disease Holding atenolol  due to soft blood pressure    Consultants: None Procedures performed: None Disposition: Home health Diet recommendation:  Carb modified diet  DISCHARGE MEDICATION: Allergies as of 05/18/2024       Reactions   Augmentin  [amoxicillin -pot Clavulanate] Rash   Facial rash during hospital stay 09/2023        Medication List     STOP taking these medications    atenolol  25 MG tablet Commonly known as: TENORMIN    spironolactone  25 MG tablet Commonly known as: Aldactone    telmisartan  20 MG tablet Commonly known as: MICARDIS    torsemide  20 MG tablet Commonly known as: DEMADEX        TAKE these medications    albuterol  (2.5 MG/3ML) 0.083% nebulizer solution Commonly known as: PROVENTIL  Take 3 mLs (2.5 mg total) by nebulization every 6 (six) hours as needed for wheezing or shortness of breath.   Anoro Ellipta  62.5-25 MCG/ACT Aepb Generic drug: umeclidinium-vilanterol TAKE 1 PUFF BY MOUTH EVERY DAY   Aspirin  81 MG Caps Take 1 capsule by mouth daily at 6 (six) AM.   atorvastatin  40 MG tablet Commonly known as: LIPITOR TAKE ONE TABLET BY MOUTH ONCE DAILY   CALCIUM  1000 + D PO Take 1 tablet by  mouth daily.   cephALEXin  500 MG capsule Commonly known as: KEFLEX  Take 1 capsule (500 mg total) by mouth in the morning and at bedtime for 3 days.   famotidine  20 MG tablet Commonly known as: PEPCID  Take 1 tablet (20 mg total) by mouth at  bedtime.   fish oil-omega-3 fatty acids  1000 MG capsule Take 2 capsules (2 g total) by mouth daily.   multivitamin with minerals Tabs tablet Take 1 tablet by mouth daily.   Oxervate 0.002 % Soln Generic drug: Cenegermin-bkbj Apply 1 drop to eye 6 (six) times daily.   Restasis  0.05 % ophthalmic emulsion Generic drug: cycloSPORINE  Place 1 drop into both eyes 2 (two) times daily.   timolol  0.5 % ophthalmic solution Commonly known as: TIMOPTIC  Place 1 drop into both eyes daily.   vitamin C  1000 MG tablet Take 1 tablet (1,000 mg total) by mouth daily.   zinc  sulfate (50mg  elemental zinc ) 220 (50 Zn) MG capsule Take 1 capsule (220 mg total) by mouth daily.        Contact information for after-discharge care     Home Medical Care     [facility]) .   Service: Home Health Services                    Discharge Exam: Filed Weights   05/15/24 2309  Weight: 67.1 kg    Vitals and nursing note reviewed.  Constitutional:      General: She is not in acute distress.    Comments: Frail appearing elderly female  HENT:     Head: Normocephalic and atraumatic.  Cardiovascular:     Rate and Rhythm: Normal rate and regular rhythm.     Heart sounds: Normal heart sounds.  Pulmonary:     Effort: Pulmonary effort is normal.     Breath sounds: Normal breath sounds.  Abdominal:     Palpations: Abdomen is soft.     Tenderness: There is no abdominal tenderness.  Neurological: Alert and oriented  Condition at discharge: good  The results of significant diagnostics from this hospitalization (including imaging, microbiology, ancillary and laboratory) are listed below for reference.   Imaging Studies: US  Abdomen Limited RUQ (LIVER/GB) Result Date: 05/17/2024 CLINICAL DATA:  [id number] Transaminitis [id number] EXAM: ULTRASOUND ABDOMEN LIMITED RIGHT UPPER QUADRANT COMPARISON:  October 14, 2023 FINDINGS: Gallbladder: Multiple layering cholelithiasis. No wall  thickening visualized. No sonographic Murphy sign noted by sonographer. Common bile duct: Diameter: Visualized portion measures 4 mm, within normal limits. Liver: No focal lesion identified. Heterogeneous and coarsened parenchymal echogenicity. Nodular liver contours. Portal vein is patent on color Doppler imaging with normal direction of blood flow towards the liver. Other: RIGHT pleural effusion. IMPRESSION: 1. Cirrhotic liver morphology. No focal lesion identified. 2. Cholelithiasis without sonographic evidence of acute cholecystitis. 3. RIGHT pleural effusion. Electronically Signed   By: Corean Salter M.D.   On: 05/17/2024 09:00   MR BRAIN WO CONTRAST Result Date: 05/16/2024 EXAM: MRI Brain Without Contrast 05/16/2024 12:57:25 AM TECHNIQUE: Multiplanar multisequence MRI of the head/brain was performed without the administration of intravenous contrast. COMPARISON: MRI head October 19, 2023 CLINICAL HISTORY: Neuro deficit, acute, stroke suspected FINDINGS: BRAIN AND VENTRICLES: No acute infarct. Remote right basal ganglia infarct with evidence of prior associated hemorrhage. No intracranial hemorrhage. No mass. No midline shift. No hydrocephalus. Similar chronic microvascular ischemic change and cerebral atrophy. Normal flow voids. ORBITS: No acute abnormality. SINUSES AND MASTOIDS: Left frontal sinus mucosal thickening. No mastoid  effusions. BONES AND SOFT TISSUES: Normal marrow signal. No acute soft tissue abnormality. IMPRESSION: 1. No acute intracranial abnormality. 2. Remote right basal ganglia infarct. Electronically signed by: Gilmore Molt MD 05/16/2024 01:14 AM EST RP Workstation: [id number]   DG Chest Port 1 View Result Date: 05/15/2024 EXAM: 1 VIEW(S) XRAY OF THE CHEST 05/15/2024 11:36:00 PM COMPARISON: 12/10/2023 CLINICAL HISTORY: Questionable sepsis - evaluate for abnormality. FINDINGS: LINES, TUBES AND DEVICES: Central venous catheter is present. LUNGS AND PLEURA: Small bilateral  pleural effusions with basilar atelectasis or infiltration. Emphysematous changes in the upper lungs. This is similar to the previous study, allowing for differences in technique. No pneumothorax. HEART AND MEDIASTINUM: The visualized heart size appears normal. No acute abnormality of the mediastinal silhouette. BONES AND SOFT TISSUES: Degenerative changes in the spine and shoulders. IMPRESSION: 1. Small bilateral pleural effusions with basilar atelectasis or infiltration, similar to the previous study, allowing for differences in technique. 2. Emphysematous changes in the upper lungs. Electronically signed by: Elsie Gravely MD 05/15/2024 11:39 PM EST RP Workstation: [id number]    Microbiology: Results for orders placed or performed during the hospital encounter of 05/15/24  Urine Culture     Status: Abnormal   Collection Time: 05/15/24 11:36 PM   Specimen: In/Out Cath Urine  Result Value Ref Range Status   Specimen Description   Final    IN/OUT CATH URINE Performed at [facility], [address], [place], [place] [address]    Special Requests   Final    NONE Performed at [facility], [address] [place], [place] [address]    Culture >=100,000 COLONIES/mL KLEBSIELLA PNEUMONIAE (A)  Final   Report Status 05/18/2024 FINAL  Final   Organism ID, Bacteria KLEBSIELLA PNEUMONIAE (A)  Final      Susceptibility   Klebsiella pneumoniae - MIC*    AMPICILLIN >=32 RESISTANT Resistant     CEFAZOLIN (URINE) Value in next row Sensitive      4 SENSITIVEThis is a modified FDA-approved test that has been validated and its performance characteristics determined by the reporting laboratory.  This laboratory is certified under the Clinical Laboratory Improvement Amendments CLIA as qualified to perform high complexity clinical laboratory testing.    CEFEPIME Value in next row Sensitive      4 SENSITIVEThis is a modified FDA-approved test that has been validated and its performance  characteristics determined by the reporting laboratory.  This laboratory is certified under the Clinical Laboratory Improvement Amendments CLIA as qualified to perform high complexity clinical laboratory testing.    ERTAPENEM Value in next row Sensitive      4 SENSITIVEThis is a modified FDA-approved test that has been validated and its performance characteristics determined by the reporting laboratory.  This laboratory is certified under the Clinical Laboratory Improvement Amendments CLIA as qualified to perform high complexity clinical laboratory testing.    CEFTRIAXONE  Value in next row Sensitive      4 SENSITIVEThis is a modified FDA-approved test that has been validated and its performance characteristics determined by the reporting laboratory.  This laboratory is certified under the Clinical Laboratory Improvement Amendments CLIA as qualified to perform high complexity clinical laboratory testing.    CIPROFLOXACIN  Value in next row Sensitive      4 SENSITIVEThis is a modified FDA-approved test that has been validated and its performance characteristics determined by the reporting laboratory.  This laboratory is certified under the Clinical Laboratory Improvement Amendments CLIA as qualified to perform high complexity clinical laboratory testing.  GENTAMICIN Value in next row Sensitive      4 SENSITIVEThis is a modified FDA-approved test that has been validated and its performance characteristics determined by the reporting laboratory.  This laboratory is certified under the Clinical Laboratory Improvement Amendments CLIA as qualified to perform high complexity clinical laboratory testing.    NITROFURANTOIN Value in next row Sensitive      4 SENSITIVEThis is a modified FDA-approved test that has been validated and its performance characteristics determined by the reporting laboratory.  This laboratory is certified under the Clinical Laboratory Improvement Amendments CLIA as qualified to perform high  complexity clinical laboratory testing.    TRIMETH/SULFA Value in next row Sensitive      4 SENSITIVEThis is a modified FDA-approved test that has been validated and its performance characteristics determined by the reporting laboratory.  This laboratory is certified under the Clinical Laboratory Improvement Amendments CLIA as qualified to perform high complexity clinical laboratory testing.    AMPICILLIN/SULBACTAM Value in next row Intermediate      4 SENSITIVEThis is a modified FDA-approved test that has been validated and its performance characteristics determined by the reporting laboratory.  This laboratory is certified under the Clinical Laboratory Improvement Amendments CLIA as qualified to perform high complexity clinical laboratory testing.    PIP/TAZO Value in next row Sensitive      <=4 SENSITIVEThis is a modified FDA-approved test that has been validated and its performance characteristics determined by the reporting laboratory.  This laboratory is certified under the Clinical Laboratory Improvement Amendments CLIA as qualified to perform high complexity clinical laboratory testing.    MEROPENEM Value in next row Sensitive      <=4 SENSITIVEThis is a modified FDA-approved test that has been validated and its performance characteristics determined by the reporting laboratory.  This laboratory is certified under the Clinical Laboratory Improvement Amendments CLIA as qualified to perform high complexity clinical laboratory testing.    * >=100,000 COLONIES/mL KLEBSIELLA PNEUMONIAE    Labs: CBC: Recent Labs  Lab 05/15/24 2336 05/17/24 0703 05/18/24 0423  WBC 9.0 6.4 7.1  NEUTROABS 6.1 3.6 4.0  HGB 12.0 11.5* 12.6  HCT 36.5 35.2* 40.0  MCV 90.3 90.7 91.1  PLT 180 163 194   Basic Metabolic Panel: Recent Labs  Lab 05/15/24 2336 05/17/24 0703 05/18/24 0423  NA 134* 137 136  K 4.5 4.1 4.5  CL 104 107 106  CO2 21* 22 20*  GLUCOSE 135* 99 119*  BUN 26* 24* 25*  CREATININE 0.95  0.86 0.88  CALCIUM  9.1 8.7* 8.8*   Liver Function Tests: Recent Labs  Lab 05/15/24 2336  AST 130*  ALT 114*  ALKPHOS 852*  BILITOT 1.3*  PROT 7.2  ALBUMIN  3.5   CBG: No results for input(s): GLUCAP in the last 168 hours.  Discharge time spent:  36 minutes.  Signed: Drue ONEIDA Potter, MD [facility] 05/18/2024

## 2024-05-18 NOTE — Telephone Encounter (Signed)
 Copied from CRM #[id number]. Topic: Appointments - Scheduling Inquiry for Clinic >> May 18, 2024  3:27 PM Dedra B wrote: Reason for CRM: Pt daughter, [name], called to schedule pt a hospital f/u appt. Next available is 12/8, which is within the 14 day f/u window since pt was discharged today. However, [name] wants pt to be seen sooner since pt was told to stop 7 of her meds until she sees her PCP.  I spoke with patient's daughter, [name], and let her know that I will go ahead and schedule patient for Dr. Verneita Osmond first available appointment, which is 06/01/2024, for her hospital follow-up.  I let [name] know that I will also add patient to the wait list and I will send message to Dr. Osmond nurse regarding her concerns.  [name] states patient was not given any of her regular medications while she was in the hospital, except the eye drops for her glaucoma.  [name] states patient was given an antibiotic, which they just picked up, and she will review the instructions so patient can start taking it.  [name] states patient was told not to take any of her regular medications until she talks with Dr. Marylynn.  [name] states she would like for us  to please call her and let her know if Dr. Marylynn agrees with the instructions patient was given.

## 2024-05-18 NOTE — Telephone Encounter (Signed)
 Pt was discharged from the hospital today. She is scheduled for a hospital follow up on 12/8/20025. However pt's daughter would like to be seen sooner, no available appts sooner. But daughter is concerned because they stopped pt's atenolol , aldactone , telmisartan  and torsemide  and was advised not to start back on them until she saw her PCP. Daughter would like to know if you agree with this or if pt should go back on the medication.

## 2024-05-19 ENCOUNTER — Telehealth: Payer: Self-pay

## 2024-05-19 NOTE — Transitions of Care (Post Inpatient/ED Visit) (Signed)
   05/19/2024  Name: [name] MRN: [id number] DOB: August 12, 1931  Today's TOC FU Call Status: Today's TOC FU Call Status:: Unsuccessful Call (1st Attempt) Unsuccessful Call (1st Attempt) Date: 05/19/24  Attempted to reach the patient regarding the most recent Inpatient/ED visit.  Follow Up Plan: Additional outreach attempts will be made to reach the patient to complete the Transitions of Care (Post Inpatient/ED visit) call.   Arvin Seip RN, BSN, CCM [employer], Population Health Case Manager Phone: [phone]

## 2024-05-19 NOTE — Telephone Encounter (Signed)
 Spoke with pt's daughter and scheduled pt for 05/26/2024 at 9:30.

## 2024-05-19 NOTE — Transitions of Care (Post Inpatient/ED Visit) (Signed)
   05/19/2024  Name: Lauren Morris MRN: [id number] DOB: 1932-06-11  Today's TOC FU Call Status: Today's TOC FU Call Status:: Unsuccessful Call (2nd Attempt) Unsuccessful Call (2nd Attempt) Date: 05/19/24  Attempted to reach the patient regarding the most recent Inpatient/ED visit.  Follow Up Plan: Additional outreach attempts will be made to reach the patient to complete the Transitions of Care (Post Inpatient/ED visit) call.   Arvin Seip RN, BSN, CCM [employer], Population Health Case Manager Phone: [phone]

## 2024-05-20 ENCOUNTER — Telehealth: Payer: Self-pay | Admitting: *Deleted

## 2024-05-20 NOTE — Transitions of Care (Post Inpatient/ED Visit) (Signed)
 05/20/2024  Name: Lauren Morris MRN: [id number] DOB: 1931-08-28  Today's TOC FU Call Status: Today's TOC FU Call Status:: Successful TOC FU Call Completed TOC FU Call Complete Date: 05/20/24  Patient's Name and Date of Birth confirmed. Name, DOB  Transition Care Management Follow-up Telephone Call Date of Discharge: 05/18/24 Discharge Facility: [facility]) Type of Discharge: Inpatient Admission Primary Inpatient Discharge Diagnosis:: urinary tract infection How have you been since you were released from the hospital?:  (eating, drinking well, ambulating with walker) Any questions or concerns?: No  Items Reviewed: Did you receive and understand the discharge instructions provided?: Yes Medications obtained,verified, and reconciled?: Yes (Medications Reviewed) Any new allergies since your discharge?: No Dietary orders reviewed?: Yes Type of Diet Ordered:: heart healthy,  carbohydrate modified Do you have support at home?: Yes People in Home [RPT]: alone Name of Support/Comfort Primary Source: adult daughter and paid caregivers Reviewed signs/ symptoms of infection, UTI, preventive measures, importance of taking antibiotic as prescribed  Medications Reviewed Today: Medications Reviewed Today     Reviewed by Aura Mliss LABOR, RN (Registered Nurse) on 05/20/24 at 1417  Med List Status: <None>   Medication Order Taking? Sig Documenting Provider Last Dose Status Informant  albuterol  (PROVENTIL ) (2.5 MG/3ML) 0.083% nebulizer solution [id number] Yes Take 3 mLs (2.5 mg total) by nebulization every 6 (six) hours as needed for wheezing or shortness of breath. Jhonny Calvin NOVAK, MD  Active Pharmacy Records, Child           Med Note RAYNE ARLEY CHRISTELLA   Sat May 16, 2024  9:13 AM) PRN  ANORO ELLIPTA  62.5-25 MCG/ACT AEPB [id number] Yes TAKE 1 PUFF BY MOUTH EVERY DAY Tullo, Teresa L, MD  Active Child, Pharmacy Records  Ascorbic Acid  (VITAMIN C ) 1000 MG tablet  [id number] Yes Take 1 tablet (1,000 mg total) by mouth daily. Marylynn Verneita CROME, MD  Active Child, Pharmacy Records  Aspirin  81 MG CAPS [id number] Yes Take 1 capsule by mouth daily at 6 (six) AM. [provider]  Active Child, Pharmacy Records  atorvastatin  (LIPITOR) 40 MG tablet [id number] Yes TAKE ONE TABLET BY MOUTH ONCE DAILY Marylynn Verneita CROME, MD  Active Child, Pharmacy Records  Calcium  Carb-Cholecalciferol (CALCIUM  1000 + D PO) [id number] Yes Take 1 tablet by mouth daily. [provider]  Active Child, Pharmacy Records  Cenegermin-bkbj (OXERVATE) 0.002 % SOLN [id number] Yes Apply 1 drop to eye 6 (six) times daily. [provider]  Active Child, Pharmacy Records           Med Note RAYNE ARLEY CHRISTELLA   Sat May 16, 2024  9:23 AM) Charlies 05-12-2024  cephALEXin  (KEFLEX ) 500 MG capsule [id number] Yes Take 1 capsule (500 mg total) by mouth in the morning and at bedtime for 3 days. Dorinda Drue DASEN, MD  Active   famotidine  (PEPCID ) 20 MG tablet [id number] Yes Take 1 tablet (20 mg total) by mouth at bedtime. Von Bellis, MD  Active Child, Pharmacy Records  fish oil-omega-3 fatty acids  1000 MG capsule [id number] Yes Take 2 capsules (2 g total) by mouth daily. Marylynn Verneita CROME, MD  Active Child, Pharmacy Records  Multiple Vitamin (MULTIVITAMIN WITH MINERALS) TABS tablet [id number] Yes Take 1 tablet by mouth daily. Regalado, Belkys A, MD  Active Child, Pharmacy Records  RESTASIS  0.05 % ophthalmic emulsion [id number] Yes Place 1 drop into both eyes 2 (two) times daily. [provider]  Active Child, Pharmacy Records  Med Note (MAY, ANITA A   Mon Oct 15, 2022  9:18 AM)    timolol  (TIMOPTIC ) 0.5 % ophthalmic solution [id number] Yes Place 1 drop into both eyes daily. [provider]  Active Pharmacy Records, Child  zinc  sulfate 220 (50 Zn) MG capsule [id number] Yes Take 1 capsule (220 mg total) by mouth daily. Marylynn Verneita CROME, MD  Active Child, Pharmacy Records             Home Care and Equipment/Supplies: Were Home Health Services Ordered?: Yes [facility],  PT saw pt 11/25 Has Agency set up a time to come to your home?: Yes First Home Health Visit Date: 05/19/24 Any new equipment or medical supplies ordered?: No  Functional Questionnaire: Do you need assistance with bathing/showering or dressing?: Yes (shower seat) Do you need assistance with meal preparation?: No Do you need assistance with eating?: No Do you have difficulty maintaining continence: No Do you need assistance with getting out of bed/getting out of a chair/moving?: Yes (walker) Do you have difficulty managing or taking your medications?: Yes (daughter provides oversight)  Follow up appointments reviewed: PCP Follow-up appointment confirmed?: Yes Date of PCP follow-up appointment?: 05/26/24 Follow-up Provider: Verneita Marylynn MD  @ 930 am Specialist Hospital Follow-up appointment confirmed?: NA Do you need transportation to your follow-up appointment?: No Do you understand care options if your condition(s) worsen?: Yes-patient verbalized understanding  SDOH Interventions Today    Flowsheet Row Most Recent Value  SDOH Interventions   Food Insecurity Interventions Intervention Not Indicated  Housing Interventions Intervention Not Indicated  Transportation Interventions Intervention Not Indicated  Utilities Interventions Intervention Not Indicated    Mliss Creed [facility], BSN RN Care Manager/ Transition of Care [place]/ [facility] Population Health [phone]

## 2024-05-26 ENCOUNTER — Encounter: Payer: Self-pay | Admitting: Internal Medicine

## 2024-05-26 ENCOUNTER — Ambulatory Visit

## 2024-05-26 ENCOUNTER — Ambulatory Visit: Admitting: Internal Medicine

## 2024-05-26 ENCOUNTER — Telehealth: Payer: Self-pay

## 2024-05-26 VITALS — BP 120/64 | HR 98 | Ht 62.0 in | Wt 151.0 lb

## 2024-05-26 DIAGNOSIS — J918 Pleural effusion in other conditions classified elsewhere: Secondary | ICD-10-CM | POA: Insufficient documentation

## 2024-05-26 DIAGNOSIS — E119 Type 2 diabetes mellitus without complications: Secondary | ICD-10-CM | POA: Diagnosis not present

## 2024-05-26 DIAGNOSIS — R0602 Shortness of breath: Secondary | ICD-10-CM

## 2024-05-26 DIAGNOSIS — I10 Essential (primary) hypertension: Secondary | ICD-10-CM | POA: Diagnosis not present

## 2024-05-26 DIAGNOSIS — E669 Obesity, unspecified: Secondary | ICD-10-CM

## 2024-05-26 DIAGNOSIS — K7469 Other cirrhosis of liver: Secondary | ICD-10-CM | POA: Diagnosis not present

## 2024-05-26 DIAGNOSIS — R0600 Dyspnea, unspecified: Secondary | ICD-10-CM | POA: Insufficient documentation

## 2024-05-26 DIAGNOSIS — Z09 Encounter for follow-up examination after completed treatment for conditions other than malignant neoplasm: Secondary | ICD-10-CM

## 2024-05-26 DIAGNOSIS — K769 Liver disease, unspecified: Secondary | ICD-10-CM | POA: Diagnosis not present

## 2024-05-26 LAB — COMPREHENSIVE METABOLIC PANEL WITH GFR
ALT: 102 U/L — ABNORMAL HIGH (ref 0–35)
AST: 124 U/L — ABNORMAL HIGH (ref 0–37)
Albumin: 3.5 g/dL (ref 3.5–5.2)
Alkaline Phosphatase: 772 U/L — ABNORMAL HIGH (ref 39–117)
BUN: 18 mg/dL (ref 6–23)
CO2: 26 meq/L (ref 19–32)
Calcium: 9.3 mg/dL (ref 8.4–10.5)
Chloride: 104 meq/L (ref 96–112)
Creatinine, Ser: 0.73 mg/dL (ref 0.40–1.20)
GFR: 72.65 mL/min (ref >60.00)
Glucose, Bld: 102 mg/dL — ABNORMAL HIGH (ref 70–99)
Potassium: 4.1 meq/L (ref 3.5–5.1)
Sodium: 135 meq/L (ref 135–145)
Total Bilirubin: 1.9 mg/dL — ABNORMAL HIGH (ref 0.2–1.2)
Total Protein: 7.1 g/dL (ref 6.0–8.3)

## 2024-05-26 LAB — CBC WITH DIFFERENTIAL/PLATELET
Basophils Absolute: 0 K/uL (ref 0.0–0.1)
Basophils Relative: 0.8 % (ref 0.0–3.0)
Eosinophils Absolute: 0.3 K/uL (ref 0.0–0.7)
Eosinophils Relative: 5.1 % — ABNORMAL HIGH (ref 0.0–5.0)
HCT: 37.6 % (ref 36.0–46.0)
Hemoglobin: 12.6 g/dL (ref 12.0–15.0)
Lymphocytes Relative: 20.1 % (ref 12.0–46.0)
Lymphs Abs: 1.2 K/uL (ref 0.7–4.0)
MCHC: 33.5 g/dL (ref 30.0–36.0)
MCV: 89.5 fl (ref 78.0–100.0)
Monocytes Absolute: 0.7 K/uL (ref 0.1–1.0)
Monocytes Relative: 11.7 % (ref 3.0–12.0)
Neutro Abs: 3.7 K/uL (ref 1.4–7.7)
Neutrophils Relative %: 62.3 % (ref 43.0–77.0)
Platelets: 232 K/uL (ref 150.0–400.0)
RBC: 4.2 Mil/uL (ref 3.87–5.11)
RDW: 15.2 % (ref 11.5–15.5)
WBC: 6 K/uL (ref 4.0–10.5)

## 2024-05-26 LAB — BRAIN NATRIURETIC PEPTIDE: Pro B Natriuretic peptide (BNP): 55 pg/mL (ref 0.0–100.0)

## 2024-05-26 MED ORDER — TORSEMIDE 20 MG PO TABS: 20.0000 mg | ORAL_TABLET | Freq: Every day | ORAL | 2 refills | Status: DC

## 2024-05-26 NOTE — Assessment & Plan Note (Signed)
Patient is stable post discharge and has no new issues or questions about discharge plans at the visit today for hospital follow up.  I have reviewed the records from the hospital admission in detail with patient and daughter [name]  today.

## 2024-05-26 NOTE — Progress Notes (Signed)
 Subjective:  Patient ID: Lauren Morris, female    DOB: November 23, 1931  Age: 89 y.o. MRN: [id number]  CC: The primary encounter diagnosis was Pleural effusion associated with hepatic disorder. Diagnoses of Other cirrhosis of liver [facility]), Hospital discharge follow-up, Shortness of breath, and Obesity, diabetes, and hypertension syndrome (HCC) were also pertinent to this visit.   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 She initially presented with generalized weakness, worsening of her baseline left lower extremity weakness with inability to ambulate with a walker , get up off the commode. She denies abdominal pain, vomiting or dysuria.   Anti hypertensives were held due to hypotension. Sepsis was not likely given normal lactic acid .  Acute CVA ruled out with MRI Ceftriaxone  changed to keflex  for UTI secondary to klebsiella  At discharge on Nov 24 ,spironolactone , atenolol  , furosemide  and  telmisartan  had been discontinued .  Lauren Morris has noticed fluid retention and more labored breathing with ADL's and with supine position .  Home weights have been elevated since discharge        Outpatient Medications Prior to Visit  Medication Sig Dispense Refill   albuterol  (PROVENTIL ) (2.5 MG/3ML) 0.083% nebulizer solution Take 3 mLs (2.5 mg total) by nebulization every 6 (six) hours as needed for wheezing or shortness of breath. 75 mL 12   ANORO ELLIPTA  62.5-25 MCG/ACT AEPB TAKE 1 PUFF BY MOUTH EVERY DAY 60 each 5   RESTASIS  0.05 % ophthalmic emulsion Place 1 drop into both eyes 2 (two) times daily.     timolol  (TIMOPTIC ) 0.5 % ophthalmic  solution Place 1 drop into both eyes daily.     Ascorbic Acid  (VITAMIN C ) 1000 MG tablet Take 1 tablet (1,000 mg total) by mouth daily. (Patient not taking: Reported on 05/26/2024) 90 tablet 3   Aspirin  81 MG CAPS Take 1 capsule by mouth daily at 6 (six) AM. (Patient not taking: Reported on 05/26/2024)     atorvastatin  (LIPITOR) 40 MG tablet TAKE ONE TABLET BY MOUTH ONCE DAILY (Patient not taking: Reported on 05/26/2024) 90 tablet 1   Calcium  Carb-Cholecalciferol (CALCIUM  1000 + D PO) Take 1 tablet by mouth daily. (Patient not taking: Reported on 05/26/2024)     famotidine  (PEPCID ) 20 MG tablet Take 1 tablet (20 mg total) by mouth at bedtime. (Patient not taking: Reported on 05/26/2024)     fish oil-omega-3 fatty acids  1000 MG capsule Take 2 capsules (2 g total) by mouth daily. (Patient not taking: Reported on 05/26/2024) 6 capsule 11   Multiple Vitamin (MULTIVITAMIN WITH MINERALS) TABS tablet Take 1 tablet by mouth daily. (Patient not taking: Reported on 05/26/2024) 30 tablet 0   zinc  sulfate 220 (50 Zn) MG capsule Take 1 capsule (220 mg total) by mouth daily. (Patient not taking: Reported on 05/26/2024) 30 capsule 11   Cenegermin-bkbj (OXERVATE) 0.002 % SOLN Apply 1 drop to eye 6 (six) times daily. (Patient not taking: Reported on 05/26/2024)     No facility-administered medications prior to visit.    Review of Systems;  Patient denies headache, fevers, malaise, unintentional weight loss, skin rash, eye pain, sinus congestion and sinus pain,  sore throat, dysphagia,  hemoptysis , cough, dyspnea, wheezing, chest pain, palpitations, orthopnea, edema, abdominal pain, nausea, melena, diarrhea, constipation, flank pain, dysuria, hematuria, urinary  Frequency, nocturia, numbness, tingling, seizures,  Focal weakness, Loss of consciousness,  Tremor, insomnia, depression, anxiety, and suicidal ideation.      Objective:  BP 120/64   Pulse 98   Ht 5' 2 (1.575 m)   Wt 151 lb (68.5 kg)   SpO2 93%   BMI 27.62  kg/m   BP Readings from Last 3 Encounters:  05/26/24 120/64  05/18/24 (!) 111/50  04/17/24 138/68    Wt Readings from Last 3 Encounters:  05/26/24 151 lb (68.5 kg)  05/15/24 148 lb (67.1 kg)  04/17/24 148 lb 12.8 oz (67.5 kg)    Physical Exam  Lab Results  Component Value Date   HGBA1C 6.0 12/19/2023   HGBA1C 5.9 09/10/2023   HGBA1C 6.0 05/07/2022    Lab Results  Component Value Date   CREATININE 0.73 05/26/2024   CREATININE 0.88 05/18/2024   CREATININE 0.86 05/17/2024    Lab Results  Component Value Date   WBC 6.0 05/26/2024   HGB 12.6 05/26/2024   HCT 37.6 05/26/2024   PLT 232.0 05/26/2024   GLUCOSE 102 (H) 05/26/2024   CHOL 183 09/10/2023   TRIG 104.0 09/10/2023   HDL 49.40 09/10/2023   LDLDIRECT 105.0 09/10/2023   LDLCALC 113 (H) 09/10/2023   ALT 102 (H) 05/26/2024   AST 124 (H) 05/26/2024   NA 135 05/26/2024   K 4.1 05/26/2024   CL 104 05/26/2024   CREATININE 0.73 05/26/2024   BUN 18 05/26/2024   CO2 26 05/26/2024   TSH 3.50 03/27/2024   INR 1.2 10/14/2023   HGBA1C 6.0 12/19/2023   MICROALBUR 1.4 09/10/2023    MR BRAIN WO CONTRAST Result Date: 05/16/2024 EXAM: MRI Brain Without Contrast 05/16/2024 12:57:25 AM TECHNIQUE: Multiplanar multisequence MRI of the head/brain was performed without the administration of intravenous contrast. COMPARISON: MRI head October 19, 2023 CLINICAL HISTORY: Neuro deficit, acute, stroke suspected FINDINGS: BRAIN AND VENTRICLES: No acute infarct. Remote right basal ganglia infarct with evidence of prior associated hemorrhage. No intracranial hemorrhage. No mass. No midline shift. No hydrocephalus. Similar chronic microvascular ischemic change and cerebral atrophy. Normal flow voids. ORBITS: No acute abnormality. SINUSES AND MASTOIDS: Left frontal sinus mucosal thickening. No mastoid effusions. BONES AND SOFT TISSUES: Normal marrow signal. No acute soft tissue abnormality. IMPRESSION: 1. No acute intracranial abnormality. 2.  Remote right basal ganglia infarct. Electronically signed by: Gilmore Molt MD 05/16/2024 01:14 AM EST RP Workstation: [id number]   DG Chest Port 1 View Result Date: 05/15/2024 EXAM: 1 VIEW(S) XRAY OF THE CHEST 05/15/2024 11:36:00 PM COMPARISON: 12/10/2023 CLINICAL HISTORY: Questionable sepsis - evaluate for abnormality. FINDINGS: LINES, TUBES AND DEVICES: Central venous catheter is present. LUNGS AND PLEURA: Small bilateral pleural effusions with basilar atelectasis or infiltration. Emphysematous changes in the upper lungs. This is similar to the previous study, allowing for differences in technique. No pneumothorax. HEART AND MEDIASTINUM: The visualized heart size appears normal. No acute abnormality of the mediastinal silhouette. BONES AND SOFT TISSUES: Degenerative changes in the spine and shoulders. IMPRESSION: 1. Small bilateral pleural effusions with basilar atelectasis or infiltration, similar to the previous study, allowing for differences in technique. 2. Emphysematous changes in the upper lungs. Electronically signed by: Elsie Gravely MD 05/15/2024 11:39 PM EST RP Workstation: [id number]    Assessment & Plan:  .Pleural effusion associated with hepatic disorder Assessment & Plan: Recurrent secondary to  suspension of diuretics during recent hospitalization.  Will need to repeat chest x ray after resuming diuretics.   Orders: -     DG Chest 2 View; Future  Other cirrhosis of liver (HCC) -     Torsemide ; Take 1 tablet (20 mg total) by mouth daily.  Dispense: 30 tablet; Refill: 2 -     Comprehensive metabolic panel with [facility]  Hospital discharge follow-up Assessment & Plan:  Patient is stable post discharge and has no new issues or questions about discharge plans at the visit today for hospital follow up.  I have reviewed the records from the hospital admission in detail with patient and daughter Lauren Morris  today.  Orders: -     Torsemide ; Take 1 tablet (20 mg total) by mouth daily.   Dispense: 30 tablet; Refill: 2  Shortness of breath Assessment & Plan: Patient has gained weight since hospital discharge due to suspensions of diuretics .  Resumeing torsemide  and spironolactone   and ordering chest x ray  Orders: -     CBC with Differential/Platelet -     Brain natriuretic peptide -     DG Chest 2 View; Future  Obesity, diabetes, and hypertension syndrome (HCC) Assessment & Plan: Her diabetes has been historically well-controlled on diet alone .  Repeat a1 is needed and will be done today.  . Patient is up-to-date on eye exams ad Patient has  INCREASING microalbuminuria., and   UaCR is  increasing. She was tolerating an increase telmisartan  to 80 mg daily until her admisson in November for UTI and acute on chronic left sided weakness;  telmisartan  was held during admission and at discharge.  Will resume today A   Historically patient has declined  statin therapy for CAD risk reduction but has been taking atorvastatin  since admission give evidence of recent CVA  Lab Results  Component Value Date   HGBA1C 6.0 12/19/2023   Lab Results  Component Value Date   MICROALBUR 1.4 09/10/2023            I spent 34 minutes on the day of this face to face encounter reviewing patient's  most recent visit with cardiology,  nephrology,  and neurology,  prior relevant surgical and non surgical procedures, recent  labs and imaging studies, counseling on weight management,  reviewing the assessment and plan with patient, and post visit ordering and reviewing of  diagnostics and therapeutics with patient  .   Follow-up: Return in about 3 months (around 08/24/2024).   Verneita LITTIE Kettering, MD

## 2024-05-26 NOTE — Telephone Encounter (Signed)
 Copied from CRM [id number]. Topic: Clinical - Home Health Verbal Orders >> May 26, 2024 11:39 AM Vena HERO wrote: Caller/Agency: [facility]ell [facility] Callback Number: [phone] Service Requested: Skilled Nursing Frequency: Eval and treat, also requesting U/A  Any new concerns about the patient? Yes, caretaker is concerned that pt is having UTI symptoms, noticed that hospital stopped some medications which could be causing uti. Pt also has swelling in both feet, also willing to get labs if needed

## 2024-05-26 NOTE — Patient Instructions (Addendum)
 You have gained fluid since discharge, please Resume  torsemide  for 3 days,  then reduce use to every other day  Resume daily spironolactone     Chest x ray today  Please eat a serving of yogurt daily for 3 weeks

## 2024-05-26 NOTE — Assessment & Plan Note (Signed)
 Recurrent secondary to suspension of diuretics during recent hospitalization.  Will need to repeat chest x ray after resuming diuretics.

## 2024-05-26 NOTE — Assessment & Plan Note (Signed)
 Patient has gained weight since hospital discharge due to suspensions of diuretics .  Resumeing torsemide  and spironolactone   and ordering chest x ray

## 2024-05-26 NOTE — Assessment & Plan Note (Signed)
 Her diabetes has been historically well-controlled on diet alone .  Repeat a1 is needed and will be done today.  . Patient is up-to-date on eye exams ad Patient has  INCREASING microalbuminuria., and   UaCR is  increasing. She was tolerating an increase telmisartan  to 80 mg daily until her admisson in November for UTI and acute on chronic left sided weakness;  telmisartan  was held during admission and at discharge.  Will resume today A   Historically patient has declined  statin therapy for CAD risk reduction but has been taking atorvastatin  since admission give evidence of recent CVA  Lab Results  Component Value Date   HGBA1C 6.0 12/19/2023   Lab Results  Component Value Date   MICROALBUR 1.4 09/10/2023

## 2024-05-27 ENCOUNTER — Ambulatory Visit: Payer: Self-pay | Admitting: Internal Medicine

## 2024-05-27 DIAGNOSIS — J90 Pleural effusion, not elsewhere classified: Secondary | ICD-10-CM | POA: Diagnosis not present

## 2024-05-27 DIAGNOSIS — I517 Cardiomegaly: Secondary | ICD-10-CM | POA: Diagnosis not present

## 2024-05-27 DIAGNOSIS — R0600 Dyspnea, unspecified: Secondary | ICD-10-CM | POA: Diagnosis not present

## 2024-05-27 DIAGNOSIS — R0989 Other specified symptoms and signs involving the circulatory and respiratory systems: Secondary | ICD-10-CM | POA: Diagnosis not present

## 2024-05-27 NOTE — Telephone Encounter (Signed)
 Is it okay to give orders for evaluation and treatment, urine collection for possible UTI and any other labs if needed?

## 2024-05-28 NOTE — Telephone Encounter (Signed)
 Spoke with Vertell and she stated that she spoke with pt's daughter and they have decided that they do not need the nursing at this time. Please disregard the order for skilled nursing.

## 2024-05-29 ENCOUNTER — Telehealth: Payer: Self-pay

## 2024-05-29 NOTE — Telephone Encounter (Signed)
 Copied from CRM [id number]. Topic: Clinical - Lab/Test Results >> May 29, 2024  8:36 AM Viola F wrote: Reason for CRM: Patient daughter [name] called for patient chest x-ray results. Please call her at [phone] (M)

## 2024-05-29 NOTE — Telephone Encounter (Unsigned)
 Copied from CRM [id number]. Topic: Clinical - Lab/Test Results >> May 29, 2024  8:36 AM Viola F wrote: Reason for CRM: Patient daughter [name] called for patient chest x-ray results. Please call her at [phone] (M) >> May 29, 2024  2:01 PM Mercedes MATSU wrote: Dwan gazella message to patients daughter [name] that the radiologist has not read the xray yet. Informed her that she will be called back with the results once the provider views it. Patients daughter ricka understood.

## 2024-05-29 NOTE — Telephone Encounter (Signed)
 noted

## 2024-05-29 NOTE — Telephone Encounter (Signed)
 LMTCB. Please let pt's daughter know that the xray has not been read by the radiologist yet.

## 2024-06-01 ENCOUNTER — Inpatient Hospital Stay: Admitting: Internal Medicine

## 2024-06-02 ENCOUNTER — Other Ambulatory Visit: Payer: Self-pay | Admitting: Internal Medicine

## 2024-06-02 ENCOUNTER — Telehealth: Payer: Self-pay

## 2024-06-02 MED ORDER — TORSEMIDE 10 MG PO TABS: 10.0000 mg | ORAL_TABLET | ORAL | 0 refills | Status: AC

## 2024-06-02 NOTE — Telephone Encounter (Signed)
 Copied from CRM #[id number]. Topic: General - Other >> Jun 02, 2024  4:43 PM Sophia H wrote: Reason for CRM: Spoke with [facility] who wanted to advise PCP that patient has been taking her fluid pills, has lost 10 pounds in the last 5 days. Patient did not want to take the meds today due to the amount of weight she has lost so quickly. Patient also had some questions regarding continuing on the medication every other day as directed. Please advise # [phone]  torsemide  (DEMADEX ) 20 MG tablet

## 2024-06-02 NOTE — Telephone Encounter (Signed)
 Spoke with pt's daughter and she stated that pt is currently taking the Torsemide  20 mg every other day. Per a verbal order from Dr. Marylynn she was advised to decrease the dose to 10 mg every other. Pt daughter gave a verbal understanding and scheduled a lab appt for Thursday. Daughter also stated that she noticed that pt's atenolol  was not on her medication list and wanted to let Dr. Marylynn know that she has been taking that as well. Daughter wanted to know if that was okay. She was advised to keep a check on pt bp and she stated that her bp has been doing good.

## 2024-06-04 ENCOUNTER — Other Ambulatory Visit

## 2024-06-04 DIAGNOSIS — E119 Type 2 diabetes mellitus without complications: Secondary | ICD-10-CM

## 2024-06-04 DIAGNOSIS — E669 Obesity, unspecified: Secondary | ICD-10-CM

## 2024-06-04 DIAGNOSIS — I10 Essential (primary) hypertension: Secondary | ICD-10-CM | POA: Diagnosis not present

## 2024-06-04 NOTE — Telephone Encounter (Signed)
 LMTCB. Please let pt's daughter, [name], know that pt can continue to take the atenolol  but to keep an eye on her bp and if it drops below 110 systolic to stop the atenolol .

## 2024-06-05 LAB — BASIC METABOLIC PANEL WITH GFR
BUN: 23 mg/dL (ref 6–23)
CO2: 24 meq/L (ref 19–32)
Calcium: 9.1 mg/dL (ref 8.4–10.5)
Chloride: 102 meq/L (ref 96–112)
Creatinine, Ser: 1 mg/dL (ref 0.40–1.20)
GFR: 49.79 mL/min — ABNORMAL LOW (ref >60.00)
Glucose, Bld: 112 mg/dL — ABNORMAL HIGH (ref 70–99)
Potassium: 4.5 meq/L (ref 3.5–5.1)
Sodium: 138 meq/L (ref 135–145)

## 2024-06-05 LAB — MICROALBUMIN / CREATININE URINE RATIO
Creatinine,U: 59.1 mg/dL
Microalb Creat Ratio: 13.8 mg/g (ref 0.0–30.0)
Microalb, Ur: 0.8 mg/dL (ref 0.0–1.9)

## 2024-06-08 ENCOUNTER — Ambulatory Visit: Payer: Self-pay | Admitting: Internal Medicine

## 2024-06-09 ENCOUNTER — Other Ambulatory Visit: Payer: Self-pay | Admitting: Internal Medicine

## 2024-06-09 ENCOUNTER — Telehealth: Payer: Self-pay | Admitting: Internal Medicine

## 2024-06-09 ENCOUNTER — Encounter: Payer: Self-pay | Admitting: Internal Medicine

## 2024-06-09 DIAGNOSIS — L299 Pruritus, unspecified: Secondary | ICD-10-CM

## 2024-06-09 NOTE — Telephone Encounter (Signed)
 Called [name] to schedule lab appointment.  Mychart message had not been read yet, relayed message to start Claritin q 6 hrs.

## 2024-06-09 NOTE — Telephone Encounter (Signed)
 Spoke to pt Lauren Morris, she stated that her Mom as been itching really bad ever since she came home from facility, it is starting to affect her sleeping as well they have switched detergents soap everything to try and pinpoint why it is happening and they  even had someone come out and check the vents they all have been there and they have not experienced it she is wanting to know  could it be from the UTI's Mom has had or possibly the medication?

## 2024-06-09 NOTE — Telephone Encounter (Signed)
 Called and spoke with Petrey.  Lab appointment scheduled 06/12/24.  Appt with Dr. Marylynn scheduled 06/24/24.  Will try claritin and will call back for sooner appointment if this does not improve itching.

## 2024-06-09 NOTE — Telephone Encounter (Signed)
 Copied from CRM #[id number]. Topic: Clinical - Lab/Test Results >> Jun 09, 2024  9:01 AM Tiffini S wrote: Reason for CRM: Patient daughter [name] DELENA Pacini had concerns about the patient's blood and urine test on 06/05/24- have questions about labs and her mother's condition  Asked to talk with nurse Harlene for a call back at [phone]

## 2024-06-10 NOTE — Telephone Encounter (Signed)
 Pt's daughter is aware and gave a verbal understanding.

## 2024-06-12 ENCOUNTER — Other Ambulatory Visit

## 2024-06-12 DIAGNOSIS — K7469 Other cirrhosis of liver: Secondary | ICD-10-CM

## 2024-06-12 DIAGNOSIS — R0602 Shortness of breath: Secondary | ICD-10-CM

## 2024-06-12 DIAGNOSIS — L299 Pruritus, unspecified: Secondary | ICD-10-CM | POA: Diagnosis not present

## 2024-06-13 LAB — COMPREHENSIVE METABOLIC PANEL WITH GFR
ALT: 70 IU/L — ABNORMAL HIGH (ref 0–32)
AST: 85 IU/L — ABNORMAL HIGH (ref 0–40)
Albumin: 3.6 g/dL — ABNORMAL LOW (ref 3.7–4.7)
Alkaline Phosphatase: 928 IU/L — ABNORMAL HIGH (ref 48–129)
BUN/Creatinine Ratio: 24 (ref 12–28)
BUN: 23 mg/dL (ref 8–27)
Bilirubin Total: 1.4 mg/dL — ABNORMAL HIGH (ref 0.0–1.2)
CO2: 22 mmol/L (ref 20–29)
Calcium: 9 mg/dL (ref 8.7–10.3)
Chloride: 99 mmol/L (ref 96–106)
Creatinine, Ser: 0.96 mg/dL (ref 0.57–1.00)
Globulin, Total: 3.3 g/dL (ref 1.5–4.5)
Glucose: 122 mg/dL — ABNORMAL HIGH (ref 70–99)
Potassium: 4.5 mmol/L (ref 3.5–5.2)
Sodium: 134 mmol/L (ref 134–144)
Total Protein: 6.9 g/dL (ref 6.0–8.5)
eGFR: 57 mL/min/1.73 — ABNORMAL LOW

## 2024-06-13 LAB — CBC WITH DIFFERENTIAL/PLATELET
Basophils Absolute: 0.1 x10E3/uL (ref 0.0–0.2)
Basos: 1 %
EOS (ABSOLUTE): 0.7 x10E3/uL — ABNORMAL HIGH (ref 0.0–0.4)
Eos: 9 %
Hematocrit: 41.2 % (ref 34.0–46.6)
Hemoglobin: 13.2 g/dL (ref 11.1–15.9)
Immature Grans (Abs): 0 x10E3/uL (ref 0.0–0.1)
Immature Granulocytes: 0 %
Lymphocytes Absolute: 2.2 x10E3/uL (ref 0.7–3.1)
Lymphs: 27 %
MCH: 29.4 pg (ref 26.6–33.0)
MCHC: 32 g/dL (ref 31.5–35.7)
MCV: 92 fL (ref 79–97)
Monocytes Absolute: 0.9 x10E3/uL (ref 0.1–0.9)
Monocytes: 11 %
Neutrophils Absolute: 4.4 x10E3/uL (ref 1.4–7.0)
Neutrophils: 52 %
Platelets: 246 x10E3/uL (ref 150–450)
RBC: 4.49 x10E6/uL (ref 3.77–5.28)
RDW: 13.7 % (ref 11.7–15.4)
WBC: 8.4 x10E3/uL (ref 3.4–10.8)

## 2024-06-14 ENCOUNTER — Ambulatory Visit: Payer: Self-pay | Admitting: Internal Medicine

## 2024-06-16 ENCOUNTER — Telehealth: Payer: Self-pay

## 2024-06-16 MED ORDER — HYDROXYZINE HCL 10 MG PO TABS: 10.0000 mg | ORAL_TABLET | Freq: Two times a day (BID) | ORAL | 0 refills | Status: DC | PRN

## 2024-06-16 NOTE — Telephone Encounter (Signed)
 Copied from CRM #[id number]. Topic: Clinical - Medication Question >> Jun 16, 2024  1:29 PM Berneda FALCON wrote: Reason for CRM: Daughter [name] is calling to check on the status of the medication that was supposed to be called in yesterday.  Medication:hydroxyzine   Preferred pharmacy: [facility] - [place], [place] - [address] [place] [address] Phone: [phone] Fax: [phone] Hours: Not open 24 hours  Please call [name] back at [phone]

## 2024-06-16 NOTE — Telephone Encounter (Signed)
 Reviewed Dr Lula notes. Have her stop claritin. Rx sent in for hydroxyzine . Have her start with just one per day prn initially. Monitor for dizziness / drowsiness.

## 2024-06-16 NOTE — Telephone Encounter (Addendum)
 Spoke with pt's daughter and informed her of Dr. Freda message below. Pt's daughter gave a verbal understanding.

## 2024-06-19 ENCOUNTER — Telehealth: Payer: Self-pay

## 2024-06-19 NOTE — Telephone Encounter (Signed)
 Copied from CRM [id number]. Topic: General - Other >> Jun 19, 2024 10:55 AM Macario HERO wrote: Reason for CRM: Andrea Mose an occupational therapy with [facility] - calling to report missed therapy visit on 06/12/24.

## 2024-06-19 NOTE — Telephone Encounter (Signed)
 Noted. Pt has appt with pcp on 12/31

## 2024-06-24 ENCOUNTER — Ambulatory Visit (INDEPENDENT_AMBULATORY_CARE_PROVIDER_SITE_OTHER): Admitting: Internal Medicine

## 2024-06-24 ENCOUNTER — Encounter: Payer: Self-pay | Admitting: Internal Medicine

## 2024-06-24 VITALS — BP 106/54 | Ht 62.0 in | Wt 143.6 lb

## 2024-06-24 DIAGNOSIS — R188 Other ascites: Secondary | ICD-10-CM | POA: Diagnosis not present

## 2024-06-24 DIAGNOSIS — I10 Essential (primary) hypertension: Secondary | ICD-10-CM | POA: Diagnosis not present

## 2024-06-24 DIAGNOSIS — L2981 Cholestatic pruritus: Secondary | ICD-10-CM | POA: Insufficient documentation

## 2024-06-24 DIAGNOSIS — K7460 Unspecified cirrhosis of liver: Secondary | ICD-10-CM | POA: Diagnosis not present

## 2024-06-24 MED ORDER — CHOLESTYRAMINE 4 GM/DOSE PO POWD: 4.0000 g | Freq: Two times a day (BID) | ORAL | 0 refills | Status: DC

## 2024-06-24 MED ORDER — HYDROXYZINE HCL 25 MG PO TABS: 25.0000 mg | ORAL_TABLET | Freq: Two times a day (BID) | ORAL | 0 refills | Status: DC | PRN

## 2024-06-24 NOTE — Assessment & Plan Note (Addendum)
"   With persistently elevated liver enzymes. Her previous GI Medoff has retired.  And whe was referred to [facility] in June 2025. But has not been seen yet BC THE OCTOBER VISIT WAS RESCHEDULED BY [facility] DUE TO CONFLICTING APPT.S ,  HAS BEEN RESCEHEDULINE WITH [facility] FOR APRIL  "

## 2024-06-24 NOTE — Assessment & Plan Note (Signed)
 Well controlled on current regimen of atenolol  l  and torsemie every other day.  LOSARTAN  /TELMISARTAN  HAS BEEN DISCONTINUED  . Renal function stable, no changes today.

## 2024-06-24 NOTE — Patient Instructions (Addendum)
 INCREASE HYDROXYZINE  TO 25 MG USING THE NEW RX.  IF IT HELPS  YOU CAN USE IT TWICE DAILY.  IF IT DOES NOT HELP,  STOP USING IT.    PLEASE START THE NEW MEDICATION CALLED  CHOLESTYRAMINE .  ADD 1   PACKET TO 8 OUNCES OF WATER OR JUICE AND  DRINK IT  15 TO 30 MINUTES BEFORE  BREAKFAST AND LUNCH

## 2024-06-24 NOTE — Progress Notes (Signed)
 "  Subjective:  Patient ID: Lauren Morris, female    DOB: 09-01-1931  Age: 89 y.o. MRN: [id number]  CC: The primary encounter diagnosis was Cholestatic pruritus. Diagnoses of Cirrhosis of liver with ascites, unspecified hepatic cirrhosis type (HCC) and Essential hypertension were also pertinent to this visit.   HPI Lauren Morris presents for  Chief Complaint  Patient presents with   Medical Management of Chronic Issues    Follow up on itching, feels like grit all over me   Lauren Morris is a 89 yr old female with a history of NASH/cirrhosis with recent hosptialization for decompensated cirrhosis who presents for management of new onset pruritus secondary to NASH:  trial of antihistamine has been ineffective; however she has only been taking hydroxyzine  10 mg once daily   LIVES  ALONE.  DTR LIVES 4 MINUTES AWAY, 7: 30 AM  ALWAYS [facility]   10. 5 HOURS DAILY .    Outpatient Medications Prior to Visit  Medication Sig Dispense Refill   albuterol  (PROVENTIL ) (2.5 MG/3ML) 0.083% nebulizer solution Take 3 mLs (2.5 mg total) by nebulization every 6 (six) hours as needed for wheezing or shortness of breath. 75 mL 12   ANORO ELLIPTA  62.5-25 MCG/ACT AEPB TAKE 1 PUFF BY MOUTH EVERY DAY 60 each 5   Ascorbic Acid  (VITAMIN C ) 1000 MG tablet Take 1 tablet (1,000 mg total) by mouth daily. 90 tablet 3   Aspirin  81 MG CAPS Take 1 capsule by mouth daily at 6 (six) AM.     atorvastatin  (LIPITOR) 40 MG tablet TAKE ONE TABLET BY MOUTH ONCE DAILY 90 tablet 1   Calcium  Carb-Cholecalciferol (CALCIUM  1000 + D PO) Take 1 tablet by mouth daily.     famotidine  (PEPCID ) 20 MG tablet Take 1 tablet (20 mg total) by mouth at bedtime.     fish oil-omega-3 fatty acids  1000 MG capsule Take 2 capsules (2 g total) by mouth daily. 6 capsule 11   Multiple Vitamin (MULTIVITAMIN WITH MINERALS) TABS tablet Take 1 tablet by mouth daily. 30 tablet 0   RESTASIS  0.05 % ophthalmic emulsion Place 1 drop into both eyes 2 (two) times  daily.     timolol  (TIMOPTIC ) 0.5 % ophthalmic solution Place 1 drop into both eyes daily.     torsemide  (DEMADEX ) 10 MG tablet Take 1 tablet (10 mg total) by mouth every other day. 45 tablet 0   zinc  sulfate 220 (50 Zn) MG capsule Take 1 capsule (220 mg total) by mouth daily. 30 capsule 11   hydrOXYzine  (ATARAX ) 10 MG tablet Take 1 tablet (10 mg total) by mouth 2 (two) times daily as needed. 30 tablet 0   No facility-administered medications prior to visit.    Review of Systems;  Patient denies headache, fevers, malaise, unintentional weight loss, skin rash, eye pain, sinus congestion and sinus pain, sore throat, dysphagia,  hemoptysis , cough, dyspnea, wheezing, chest pain, palpitations, orthopnea, edema, abdominal pain, nausea, melena, diarrhea, constipation, flank pain, dysuria, hematuria, urinary  Frequency, nocturia, numbness, tingling, seizures,  Focal weakness, Loss of consciousness,  Tremor, insomnia, depression, anxiety, and suicidal ideation.      Objective:  BP (!) 106/54   Ht 5' 2 (1.575 m)   Wt 143 lb 9.6 oz (65.1 kg)   BMI 26.26 kg/m   BP Readings from Last 3 Encounters:  06/24/24 (!) 106/54  05/26/24 120/64  05/18/24 (!) 111/50    Wt Readings from Last 3 Encounters:  06/24/24 143 lb 9.6 oz (  65.1 kg)  05/26/24 151 lb (68.5 kg)  05/15/24 148 lb (67.1 kg)    Physical Exam Vitals reviewed.  Constitutional:      General: She is not in acute distress.    Appearance: Normal appearance. She is normal weight. She is not ill-appearing, toxic-appearing or diaphoretic.  HENT:     Head: Normocephalic.  Eyes:     General: No scleral icterus.       Right eye: No discharge.        Left eye: No discharge.     Conjunctiva/sclera: Conjunctivae normal.  Cardiovascular:     Rate and Rhythm: Normal rate and regular rhythm.     Heart sounds: Normal heart sounds.  Pulmonary:     Effort: Pulmonary effort is normal. No respiratory distress.     Breath sounds: Normal breath  sounds.  Musculoskeletal:        General: Normal range of motion.  Skin:    General: Skin is warm and dry.  Neurological:     General: No focal deficit present.     Mental Status: She is alert and oriented to person, place, and time. Mental status is at baseline.  Psychiatric:        Mood and Affect: Mood normal.        Behavior: Behavior normal.        Thought Content: Thought content normal.        Judgment: Judgment normal.     Lab Results  Component Value Date   HGBA1C 6.0 12/19/2023   HGBA1C 5.9 09/10/2023   HGBA1C 6.0 05/07/2022    Lab Results  Component Value Date   CREATININE 0.96 06/12/2024   CREATININE 1.00 06/04/2024   CREATININE 0.73 05/26/2024    Lab Results  Component Value Date   WBC 8.4 06/12/2024   HGB 13.2 06/12/2024   HCT 41.2 06/12/2024   PLT 246 06/12/2024   GLUCOSE 122 (H) 06/12/2024   CHOL 183 09/10/2023   TRIG 104.0 09/10/2023   HDL 49.40 09/10/2023   LDLDIRECT 105.0 09/10/2023   LDLCALC 113 (H) 09/10/2023   ALT 70 (H) 06/12/2024   AST 85 (H) 06/12/2024   NA 134 06/12/2024   K 4.5 06/12/2024   CL 99 06/12/2024   CREATININE 0.96 06/12/2024   BUN 23 06/12/2024   CO2 22 06/12/2024   TSH 3.50 03/27/2024   INR 1.2 10/14/2023   HGBA1C 6.0 12/19/2023   MICROALBUR 0.8 06/04/2024    MR BRAIN WO CONTRAST Result Date: 05/16/2024 EXAM: MRI Brain Without Contrast 05/16/2024 12:57:25 AM TECHNIQUE: Multiplanar multisequence MRI of the head/brain was performed without the administration of intravenous contrast. COMPARISON: MRI head October 19, 2023 CLINICAL HISTORY: Neuro deficit, acute, stroke suspected FINDINGS: BRAIN AND VENTRICLES: No acute infarct. Remote right basal ganglia infarct with evidence of prior associated hemorrhage. No intracranial hemorrhage. No mass. No midline shift. No hydrocephalus. Similar chronic microvascular ischemic change and cerebral atrophy. Normal flow voids. ORBITS: No acute abnormality. SINUSES AND MASTOIDS: Left frontal  sinus mucosal thickening. No mastoid effusions. BONES AND SOFT TISSUES: Normal marrow signal. No acute soft tissue abnormality. IMPRESSION: 1. No acute intracranial abnormality. 2. Remote right basal ganglia infarct. Electronically signed by: Gilmore Molt MD 05/16/2024 01:14 AM EST RP Workstation: [id number]   DG Chest Port 1 View Result Date: 05/15/2024 EXAM: 1 VIEW(S) XRAY OF THE CHEST 05/15/2024 11:36:00 PM COMPARISON: 12/10/2023 CLINICAL HISTORY: Questionable sepsis - evaluate for abnormality. FINDINGS: LINES, TUBES AND DEVICES: Central venous catheter is present. LUNGS AND  PLEURA: Small bilateral pleural effusions with basilar atelectasis or infiltration. Emphysematous changes in the upper lungs. This is similar to the previous study, allowing for differences in technique. No pneumothorax. HEART AND MEDIASTINUM: The visualized heart size appears normal. No acute abnormality of the mediastinal silhouette. BONES AND SOFT TISSUES: Degenerative changes in the spine and shoulders. IMPRESSION: 1. Small bilateral pleural effusions with basilar atelectasis or infiltration, similar to the previous study, allowing for differences in technique. 2. Emphysematous changes in the upper lungs. Electronically signed by: Elsie Gravely MD 05/15/2024 11:39 PM EST RP Workstation: [id number]    Assessment & Plan:  .Cholestatic pruritus Assessment & Plan: Presumed.  Not responsive to low dose  anthistamines.  Will initiate tiral of cholestyramine . The bile acid resins cholestyramine is  effective first-line agents in the management of moderate to severe cholestatic pruritus based on their favorable safety profile and clinical experience the effective dose of cholestyramine ranges from 4 to 16 grams per day. Treatment typically begins with two 4-gram doses daily, with additional doses being added if needed. In patients who are also taking UDCA, bile acid sequestrants should be given two to four hours before or after  ingestion of UDCA. Efficacy may be increased by administering a dose before and a dose after breakfast in patients with an intact gallbladder to enhance the excretion of the pruritogens, which presumably accumulate in the gallbladder during the overnight fast. If symptoms persist, the dose can be titrated upward by giving a dose after lunch and, if needed, a dose after dinner.    Cirrhosis of liver with ascites, unspecified hepatic cirrhosis type [facility]) Assessment & Plan:  With persistently elevated liver enzymes. Her previous GI Medoff has retired.  And whe was referred to [facility] in June 2025. But has not been seen yet BC THE OCTOBER VISIT WAS RESCHEDULED BY [facility] DUE TO CONFLICTING APPT.S ,  HAS BEEN RESCEHEDULINE WITH [facility] FOR APRIL    Essential hypertension Assessment & Plan: Well controlled on current regimen of atenolol  l  and torsemie every other day.  LOSARTAN  /TELMISARTAN  HAS BEEN DISCONTINUED  . Renal function stable, no changes today.   Other orders -     Cholestyramine; Take 1 packet (4 g total) by mouth 2 (two) times daily with a meal.  Dispense: 378 g; Refill: 0 -     hydrOXYzine  HCl; Take 1 tablet (25 mg total) by mouth 2 (two) times daily as needed for itching.  Dispense: 60 tablet; Refill: 0    I personally spent a total of 40 minutes in the care of the patient today including preparing to see the patient, getting/reviewing separately obtained history, performing a medically appropriate exam/evaluation, counseling and educating, referring and communicating with other health care professionals, documenting clinical information in the EHR, and independently interpreting results.  Follow-up: Return in about 4 weeks (around 07/22/2024).   Verneita LITTIE Kettering, MD "

## 2024-06-24 NOTE — Assessment & Plan Note (Addendum)
 Presumed.  Not responsive to low dose  anthistamines.  Will initiate tiral of cholestyramine . The bile acid resins cholestyramine is  effective first-line agents in the management of moderate to severe cholestatic pruritus based on their favorable safety profile and clinical experience the effective dose of cholestyramine ranges from 4 to 16 grams per day. Treatment typically begins with two 4-gram doses daily, with additional doses being added if needed. In patients who are also taking UDCA, bile acid sequestrants should be given two to four hours before or after ingestion of UDCA. Efficacy may be increased by administering a dose before and a dose after breakfast in patients with an intact gallbladder to enhance the excretion of the pruritogens, which presumably accumulate in the gallbladder during the overnight fast. If symptoms persist, the dose can be titrated upward by giving a dose after lunch and, if needed, a dose after dinner.

## 2024-06-26 ENCOUNTER — Telehealth: Payer: Self-pay

## 2024-06-26 ENCOUNTER — Other Ambulatory Visit (HOSPITAL_COMMUNITY): Payer: Self-pay

## 2024-06-26 NOTE — Telephone Encounter (Signed)
 Clinical questions have been answered and PA submitted. PA currently Pending.

## 2024-06-26 NOTE — Telephone Encounter (Signed)
 Pharmacy Patient Advocate Encounter   Received notification from Onbase that prior authorization for hydrOXYzine  HCl 25MG  tablets is required/requested.   Insurance verification completed.   The patient is insured through [employer]   Per test claim: PA required; PA started via CoverMyMeds. KEY [id number] . Waiting for clinical questions to populate.

## 2024-06-28 ENCOUNTER — Other Ambulatory Visit: Payer: Self-pay | Admitting: Internal Medicine

## 2024-06-28 DIAGNOSIS — J410 Simple chronic bronchitis: Secondary | ICD-10-CM

## 2024-06-29 ENCOUNTER — Other Ambulatory Visit (HOSPITAL_COMMUNITY): Payer: Self-pay

## 2024-06-29 NOTE — Telephone Encounter (Signed)
 noted

## 2024-06-29 NOTE — Telephone Encounter (Signed)
 Pharmacy Patient Advocate Encounter  Received notification from [facility] that Prior Authorization for  hydrOXYzine  HCl 25MG  tablets  has been APPROVED from 06/26/24 to 06/26/25. Ran test claim, Copay is $10.53. This test claim was processed through [facility]- copay amounts may vary at other pharmacies due to pharmacy/plan contracts, or as the patient moves through the different stages of their insurance plan.   PA #/Case ID/Reference #: [id number]

## 2024-06-30 ENCOUNTER — Telehealth: Payer: Self-pay

## 2024-06-30 NOTE — Telephone Encounter (Signed)
 Copied from CRM [id number]. Topic: Clinical - Medical Advice >> Jun 30, 2024  8:53 AM Carlyon D wrote: Reason for CRM:  pt daughter [name] calling stating pt is Suppose to be taking torsemide  every other day , pt is also taking a new medication that you mix with liquid. Which is also making pt use the washroom  More. Pt also dropped 2 lbs since yesterday 06/30/23 daughter ricka states mom is eating and drinking but daughter is a bit concerned. Daughter [name] is asking if mom should take the torsemide  today ? She needs medical advice and would like a call back [phone]

## 2024-06-30 NOTE — Telephone Encounter (Signed)
 Pt's daughter is aware and gave a verbal understanding. Pt's daughter wanted to let Dr. Marylynn know that Dr. Sanjuan office has moved pt up to the top of the cancellation list so they will get her in sooner.

## 2024-07-06 ENCOUNTER — Telehealth: Payer: Self-pay

## 2024-07-06 NOTE — Telephone Encounter (Signed)
 Is it okay to give verbal orders?

## 2024-07-06 NOTE — Telephone Encounter (Signed)
 Copied from CRM #[id number]. Topic: Clinical - Home Health Verbal Orders >> Jul 06, 2024 12:18 PM Zy'onna H wrote: Caller/Agency: Sonya from [facility] called in on the patients behalf regarding a Home Health order initially submitted on 05/26/2024. Marcus from [employer] back in on 06/02/2024 It is past the avg. Turnaround time.  I spoke with Darice from the CAL who advised I should send another request as at this time they only accept faxes electronically. And she doesn't believe they received it yet She advised me they are short on staff to review faxes at this time ** The CMA Darice needed to assist with this request is currently on Lunch returning around 1 pm.  Lorrin Rushing Number: #[phone] Service Requested: Skilled Nursing Frequency: Evaluation, Treatment, requesting U/A as well.  Any new concerns about the patient? Yes, they were originally treating patient for UTI symptoms.   Referencing Original CRM [phone]

## 2024-07-07 ENCOUNTER — Ambulatory Visit: Payer: Self-pay

## 2024-07-07 ENCOUNTER — Other Ambulatory Visit (INDEPENDENT_AMBULATORY_CARE_PROVIDER_SITE_OTHER)

## 2024-07-07 DIAGNOSIS — R300 Dysuria: Secondary | ICD-10-CM

## 2024-07-07 DIAGNOSIS — E785 Hyperlipidemia, unspecified: Secondary | ICD-10-CM | POA: Diagnosis not present

## 2024-07-07 DIAGNOSIS — E0500 Thyrotoxicosis with diffuse goiter without thyrotoxic crisis or storm: Secondary | ICD-10-CM | POA: Diagnosis not present

## 2024-07-07 DIAGNOSIS — K760 Fatty (change of) liver, not elsewhere classified: Secondary | ICD-10-CM | POA: Diagnosis not present

## 2024-07-07 DIAGNOSIS — J449 Chronic obstructive pulmonary disease, unspecified: Secondary | ICD-10-CM | POA: Diagnosis not present

## 2024-07-07 DIAGNOSIS — G4733 Obstructive sleep apnea (adult) (pediatric): Secondary | ICD-10-CM | POA: Diagnosis not present

## 2024-07-07 DIAGNOSIS — N390 Urinary tract infection, site not specified: Secondary | ICD-10-CM | POA: Diagnosis not present

## 2024-07-07 DIAGNOSIS — E039 Hypothyroidism, unspecified: Secondary | ICD-10-CM | POA: Diagnosis not present

## 2024-07-07 DIAGNOSIS — K7460 Unspecified cirrhosis of liver: Secondary | ICD-10-CM | POA: Diagnosis not present

## 2024-07-07 DIAGNOSIS — I4510 Unspecified right bundle-branch block: Secondary | ICD-10-CM | POA: Diagnosis not present

## 2024-07-07 DIAGNOSIS — I69354 Hemiplegia and hemiparesis following cerebral infarction affecting left non-dominant side: Secondary | ICD-10-CM | POA: Diagnosis not present

## 2024-07-07 DIAGNOSIS — I10 Essential (primary) hypertension: Secondary | ICD-10-CM | POA: Diagnosis not present

## 2024-07-07 DIAGNOSIS — E119 Type 2 diabetes mellitus without complications: Secondary | ICD-10-CM | POA: Diagnosis not present

## 2024-07-07 NOTE — Telephone Encounter (Signed)
 Verbal orders given

## 2024-07-07 NOTE — Telephone Encounter (Signed)
 Dpoke to pt daughter scheduled her for lab appt this afternoon

## 2024-07-07 NOTE — Telephone Encounter (Signed)
 No same day OV and pt has a hx of severe UTIs. Asking if you can order a UA/Cult at the lab vs. Going to UC or ED. Scheduled next day OV. Agreeable to UC or ED with any worsening or new symptoms. Please call daughter [name] at [phone] to advise.  FYI Only or Action Required?: Action required by provider: clinical question for provider.  Patient was last seen in primary care on 06/24/2024 by Marylynn Verneita CROME, MD.  Called Nurse Triage reporting Dysuria.  Symptoms began yesterday.  Interventions attempted: Nothing.  Symptoms are: unchanged.  Triage Disposition: See Physician Within 24 Hours  Patient/caregiver understands and will follow disposition?: Yes  Reason for Disposition  Urinating more frequently than usual (i.e., frequency) OR new-onset of the feeling of an urgent need to urinate (i.e., urgency)  Answer Assessment - Initial Assessment Questions Daughter reports pt is c/o burning with urination and frequent urination. Denies c/o lower back pain, fever, abd pain. States recently had UTI one month ago and was previously hospitalized for multiple UTIs.   1. SYMPTOM: What's the main symptom you're concerned about? (e.g., frequency, incontinence)     Burning with urination, denies other symptoms 2. ONSET:      07/06/24 at night 3. PAIN: Is there any pain? If Yes, ask: How bad is it? (Scale: 1-10; mild, moderate, severe)     Moderate 4. CAUSE: What do you think is causing the symptoms?     UTI 5. OTHER SYMPTOMS: Do you have any other symptoms? (e.g., blood in urine, fever, flank pain, pain with urination)     Denies  Protocols used: Urinary Symptoms-A-AH  Copied from CRM #[id number]. Topic: Clinical - Medication Question >> Jul 07, 2024 12:08 PM Tonda B wrote: Reason for CRM: pt daughter is calling saying that pt has a possible uti and wants to know if she can get an rx please call  [name] Pacini (Daughter)  [phone]

## 2024-07-08 ENCOUNTER — Ambulatory Visit

## 2024-07-08 LAB — URINALYSIS, ROUTINE W REFLEX MICROSCOPIC
Bilirubin Urine: NEGATIVE
Hgb urine dipstick: NEGATIVE
Ketones, ur: NEGATIVE
Nitrite: POSITIVE — AB
RBC / HPF: NONE SEEN
Specific Gravity, Urine: 1.02 (ref 1.000–1.030)
Total Protein, Urine: NEGATIVE
Urine Glucose: NEGATIVE
Urobilinogen, UA: 0.2 (ref 0.0–1.0)
pH: 6 (ref 5.0–8.0)

## 2024-07-09 ENCOUNTER — Ambulatory Visit: Payer: Self-pay | Admitting: Internal Medicine

## 2024-07-09 LAB — URINE CULTURE

## 2024-07-10 ENCOUNTER — Telehealth: Payer: Self-pay

## 2024-07-10 MED ORDER — CIPROFLOXACIN HCL 250 MG PO TABS: 250.0000 mg | ORAL_TABLET | Freq: Two times a day (BID) | ORAL | 0 refills | Status: AC

## 2024-07-10 NOTE — Telephone Encounter (Signed)
 Copied from CRM #[id number]. Topic: Clinical - Lab/Test Results >> Jul 09, 2024  4:54 PM Delon DASEN wrote: Reason for CRM: calling for urine test results- no doctor notes yet

## 2024-07-10 NOTE — Telephone Encounter (Signed)
 Patient caregiver has been notified that treating patient imperical and symptoms do not improve by next week to call the office. I advised there was not enough urine for culture but caregiver advised there was a half of a urine cup she thought that would have been enough.

## 2024-07-10 NOTE — Telephone Encounter (Unsigned)
 Copied from CRM #[id number]. Topic: Clinical - Lab/Test Results >> Jul 09, 2024  4:54 PM Delon DASEN wrote: Reason for CRM: calling for urine test results- no doctor notes yet >> Jul 10, 2024 11:42 AM Terri MATSU wrote: Patients daughter [name] is calling regarding moms test results for UTI. Advised (per cal) that they will reach out to them asap.

## 2024-07-17 ENCOUNTER — Other Ambulatory Visit: Payer: Self-pay | Admitting: Internal Medicine

## 2024-07-17 DIAGNOSIS — K7469 Other cirrhosis of liver: Secondary | ICD-10-CM

## 2024-07-21 ENCOUNTER — Telehealth: Payer: Self-pay

## 2024-07-21 NOTE — Telephone Encounter (Signed)
 noted

## 2024-07-21 NOTE — Telephone Encounter (Signed)
 Copied from CRM #[id number]. Topic: Appointments - Scheduling Inquiry for Clinic >> Jul 21, 2024 11:22 AM Rea BROCKS wrote: Reason for CRM: Patient's daughter [name] is seeing if mom/patient can be squeezed in for a follow up for UTI. She doesnt want mom  to come out tomorrow due to the weather. She is asking if she can be worked in to follow up from UTI medicine with Dr. Marylynn.   [phone] (M)  I spoke with patient's daughter, [name], and Harlene Sheldon, CMA, who states ok to have this appointment virtually.  [name] is ok with virtual visit, so I scheduled patient's virtual visit for tomorrow at 1pm.

## 2024-07-22 ENCOUNTER — Telehealth (INDEPENDENT_AMBULATORY_CARE_PROVIDER_SITE_OTHER): Admitting: Internal Medicine

## 2024-07-22 ENCOUNTER — Ambulatory Visit: Admitting: Internal Medicine

## 2024-07-22 ENCOUNTER — Other Ambulatory Visit: Payer: Self-pay | Admitting: Internal Medicine

## 2024-07-22 ENCOUNTER — Encounter: Payer: Self-pay | Admitting: Internal Medicine

## 2024-07-22 VITALS — Ht 61.5 in | Wt 140.0 lb

## 2024-07-22 DIAGNOSIS — I69354 Hemiplegia and hemiparesis following cerebral infarction affecting left non-dominant side: Secondary | ICD-10-CM | POA: Diagnosis not present

## 2024-07-22 DIAGNOSIS — R300 Dysuria: Secondary | ICD-10-CM

## 2024-07-22 DIAGNOSIS — R188 Other ascites: Secondary | ICD-10-CM

## 2024-07-22 DIAGNOSIS — J410 Simple chronic bronchitis: Secondary | ICD-10-CM | POA: Diagnosis not present

## 2024-07-22 DIAGNOSIS — E1169 Type 2 diabetes mellitus with other specified complication: Secondary | ICD-10-CM | POA: Diagnosis not present

## 2024-07-22 DIAGNOSIS — K766 Portal hypertension: Secondary | ICD-10-CM

## 2024-07-22 DIAGNOSIS — J9601 Acute respiratory failure with hypoxia: Secondary | ICD-10-CM

## 2024-07-22 DIAGNOSIS — J439 Emphysema, unspecified: Secondary | ICD-10-CM | POA: Diagnosis not present

## 2024-07-22 DIAGNOSIS — I7102 Dissection of abdominal aorta: Secondary | ICD-10-CM | POA: Diagnosis not present

## 2024-07-22 DIAGNOSIS — I110 Hypertensive heart disease with heart failure: Secondary | ICD-10-CM | POA: Diagnosis not present

## 2024-07-22 DIAGNOSIS — K7460 Unspecified cirrhosis of liver: Secondary | ICD-10-CM

## 2024-07-22 DIAGNOSIS — D329 Benign neoplasm of meninges, unspecified: Secondary | ICD-10-CM

## 2024-07-22 MED ORDER — CHOLESTYRAMINE 4 GM/DOSE PO POWD: ORAL | 1 refills | Status: AC

## 2024-07-22 NOTE — Assessment & Plan Note (Signed)
 Continue daily spironolactone  and every other day  torsemide 

## 2024-07-22 NOTE — Assessment & Plan Note (Signed)
 Based on aortic  duplex in Oct 2025. This is a chronic dissection and unlikely to cause any major flow-limiting problems at this point.  No current intervention or treatment is necessary.  She will continue annurl surveillance going forward.

## 2024-07-22 NOTE — Assessment & Plan Note (Signed)
 BP has been normal at home and she is not orthopneic

## 2024-07-22 NOTE — Assessment & Plan Note (Signed)
 Previous suspicion for COPD, however PFTs have been unremarkable.  She is still on chronic Anoro.  She is dyspnec with exertion but home sats are 96%

## 2024-07-22 NOTE — Patient Instructions (Addendum)
 Resume Questran  starting with one dose before and after breakfast  After 2 days , you can add a dose after lunch if itching persists,  and a dose after dinner may be added if needed   Use the hydroxyzine  for the runny nose

## 2024-07-22 NOTE — Assessment & Plan Note (Signed)
"   hemoglobin A1c has been repeatedly  <6.0 without pharmacologic intervention.   Patient is  Up to date on eye exam and foot exam   There is  New onset microalbuminuria on current  micro urinalysis; however her blood pressure is low given use of atenolol  for thyroid  disease. .Will recommend change in HTN therapy:  Advised to dc  amlodipine  and start ARB.   She is opposed to statin use .   Lab Results  Component Value Date   HGBA1C 6.0 12/19/2023   Lab Results  Component Value Date   MICROALBUR 0.8 06/04/2024   Lab Results  Component Value Date   CREATININE 0.96 06/12/2024    "

## 2024-07-22 NOTE — Progress Notes (Addendum)
 Virtual Visit via Caregility   Note   This format is felt to be most appropriate for this patient at this time.  All issues noted in this document were discussed and addressed.  No physical exam was performed (except for noted visual exam findings with Video Visits).   I connected with Lauren Morris  on 07/22/24 at  1:00 PM EST by a video enabled telemedicine application  and verified that I am speaking with the correct person using two identifiers. Location patient: home Location provider: work or home office Persons participating in the virtual visit: patient, provider and daughter Lauren Morris   I discussed the limitations, risks, security and privacy concerns of performing an evaluation and management service by telephone and the availability of in person appointments. I also discussed with the patient that there may be a patient responsible charge related to this service. The patient expressed understanding and agreed to proceed.  Interactive audio and video telecommunications  Reason for visit: follow up on multiple issues   HPI:  89 yr old female with nonalcoholic cirrhosis, type 2 DM., COPD and aortic aneurysm presents for follow up   Seen for HFU Dec 2.  Chest x ray repeated given suspension of all diuretics.  Moderate left pleural effusion noted along with mall right pleural effusion noted.  Diuretics restarted with wt loss of 10 lbs in  5 days.  Now on every other day torsemide  and daily spironolactone .    Itching:  presumed due to cirrhosis: questran  started  but never advanced beyong twice daily and stopped after 4 days. .  Still itching   Recent episode of chills rigors,  tremulous , weak legged,  and dyspneic  occurred at 4 am  on Saturday or Sunday night.  Still weak since then. Daughter Lauren Morris . Gives history .   Most recent reading 123/68 today  wt 150 and pulse ox 94,,  pulse 62. NO COUGH,  NO ORTHOPNEA.      ROS: See pertinent positives and negatives per HPI.  Past Medical History:   Diagnosis Date   Community acquired pneumonia 04/05/2016   Cyst    hx o on right breast   DJD (degenerative joint disease)    right knee   Glaucoma    Hypertension    Pneumonia due to COVID-19 virus 12/12/2020   Septic shock (HCC) 10/14/2023   SIRS (systemic inflammatory response syndrome) (HCC) 10/10/2022   Sleep apnea    Thyroid  disease    Urinary tract infection 05/16/2024   UTI (urinary tract infection) 11/12/2021    Past Surgical History:  Procedure Laterality Date   ABDOMINAL HYSTERECTOMY     VITRECTOMY  July 2012   right eye with membrane peel [facility], [place])    Family History  Problem Relation Age of Onset   Cancer Father    Hypertension Father    Heart attack Mother    Hypertension Mother     SOCIAL HX:  reports that she has never smoked. She has never used smokeless tobacco. She reports that she does not drink alcohol  and does not use drugs.   Current Medications[1]  EXAM:  VITALS per patient if applicable:  GENERAL: alert, oriented, appears well and in no acute distress  HEENT: atraumatic, conjunttiva clear, no obvious abnormalities on inspection of external nose and ears  NECK: normal movements of the head and neck  LUNGS: on inspection no signs of respiratory distress, breathing rate appears normal, no obvious gross SOB, gasping or wheezing  CV: no obvious  cyanosis  MS: moves all visible extremities without noticeable abnormality  PSYCH/NEURO: pleasant and cooperative, no obvious depression or anxiety, speech and thought processing grossly intact  ASSESSMENT AND PLAN: Portal hypertension (HCC)  Acute respiratory failure with hypoxia (HCC)  Hypertensive heart disease with heart failure (HCC) Assessment & Plan: BP has been normal at home and she is not orthopneic    Hemiplegia and hemiparesis following cerebral infarction affecting left non-dominant side (HCC)  Chronic obstructive pulmonary disease with emphysema, unspecified emphysema  type (HCC) Assessment & Plan: Previous suspicion for COPD, however PFTs have been unremarkable.  She is still on chronic Anoro.  She is dyspnec with exertion but home sats are 96%     Meningioma (HCC)  Type 2 diabetes mellitus with other specified complication, without long-term current use of insulin  (HCC) Assessment & Plan:  hemoglobin A1c has been repeatedly  <6.0 without pharmacologic intervention.   Patient is  Up to date on eye exam and foot exam   There is  New onset microalbuminuria on current  micro urinalysis; however her blood pressure is low given use of atenolol  for thyroid  disease. .Will recommend change in HTN therapy:  Advised to dc  amlodipine  and start ARB.   She is opposed to statin use .   Lab Results  Component Value Date   HGBA1C 6.0 12/19/2023   Lab Results  Component Value Date   MICROALBUR 0.8 06/04/2024   Lab Results  Component Value Date   CREATININE 0.96 06/12/2024      Chronic bronchitis, simple (HCC) Assessment & Plan: Continue Anoro .  She is    Cirrhosis, nonalcoholic (HCC) Assessment & Plan: Continue daily spironolactone  and every other day  torsemide     Cirrhosis of liver with ascites, unspecified hepatic cirrhosis type [facility]) Assessment & Plan:  With persistently elevated liver enzymes. Her previous GI Medoff has retired.  And whe was referred to [facility] in June 2025. But has not been seen yet;  has a appt win[facility] in [place].    Aortic dissection, abdominal [facility]) Assessment & Plan: Based on aortic  duplex in Oct 2025. This is a chronic dissection and unlikely to cause any major flow-limiting problems at this point.  No current intervention or treatment is necessary.  She will continue annurl surveillance going forward.     Other orders -     Cholestyramine ; One dose before and after breakfast .  May add post lunch and post dinner dose  if needed  Dispense: 378 g; Refill: 1      I discussed the assessment and treatment plan  with the patient. The patient was provided an opportunity to ask questions and all were answered. The patient agreed with the plan and demonstrated an understanding of the instructions.   The patient was advised to call back or seek an in-person evaluation if the symptoms worsen or if the condition fails to improve as anticipated.   I spent 30 minutes dedicated to the care of this patient on the date of this encounter to include pre-visit review of patient's medical history,  including recent ER visit, imaging studies and labs, face-to-face time with the patient , and post visit ordering of testing and therapeutics.    Lauren LITTIE Kettering, MD      [1]  Current Outpatient Medications:    albuterol  (PROVENTIL ) (2.5 MG/3ML) 0.083% nebulizer solution, Take 3 mLs (2.5 mg total) by nebulization every 6 (six) hours as needed for wheezing or shortness of breath., Disp:  75 mL, Rfl: 12   ANORO ELLIPTA  62.5-25 MCG/ACT AEPB, TAKE 1 PUFF BY MOUTH EVERY DAY, Disp: 60 each, Rfl: 4   Ascorbic Acid  (VITAMIN C ) 1000 MG tablet, Take 1 tablet (1,000 mg total) by mouth daily., Disp: 90 tablet, Rfl: 3   Aspirin  81 MG CAPS, Take 1 capsule by mouth daily at 6 (six) AM., Disp: , Rfl:    atenolol  (TENORMIN ) 25 MG tablet, Take 25 mg by mouth 2 (two) times daily., Disp: , Rfl:    atorvastatin  (LIPITOR) 40 MG tablet, TAKE ONE TABLET BY MOUTH ONCE DAILY, Disp: 90 tablet, Rfl: 1   Calcium  Carb-Cholecalciferol (CALCIUM  1000 + D PO), Take 1 tablet by mouth daily., Disp: , Rfl:    famotidine  (PEPCID ) 20 MG tablet, Take 1 tablet (20 mg total) by mouth at bedtime., Disp: , Rfl:    fish oil-omega-3 fatty acids  1000 MG capsule, Take 2 capsules (2 g total) by mouth daily., Disp: 6 capsule, Rfl: 11   Multiple Vitamin (MULTIVITAMIN WITH MINERALS) TABS tablet, Take 1 tablet by mouth daily., Disp: 30 tablet, Rfl: 0   RESTASIS  0.05 % ophthalmic emulsion, Place 1 drop into both eyes 2 (two) times daily., Disp: , Rfl:    spironolactone   (ALDACTONE ) 25 MG tablet, TAKE 1 TABLET BY MOUTH ONCE DAILY, Disp: 90 tablet, Rfl: 3   timolol  (TIMOPTIC ) 0.5 % ophthalmic solution, Place 1 drop into both eyes daily., Disp: , Rfl:    torsemide  (DEMADEX ) 10 MG tablet, Take 1 tablet (10 mg total) by mouth every other day., Disp: 45 tablet, Rfl: 0   zinc  sulfate 220 (50 Zn) MG capsule, Take 1 capsule (220 mg total) by mouth daily., Disp: 30 capsule, Rfl: 11   cholestyramine  (QUESTRAN ) 4 GM/DOSE powder, One dose before and after breakfast .  May add post lunch and post dinner dose  if needed, Disp: 378 g, Rfl: 1

## 2024-07-22 NOTE — Assessment & Plan Note (Signed)
"   With persistently elevated liver enzymes. Her previous GI Medoff has retired.  And whe was referred to [facility] in June 2025. But has not been seen yet;  has a appt win[facility] in [place].  "

## 2024-07-22 NOTE — Assessment & Plan Note (Signed)
 Continue Anoro .  She is

## 2024-07-23 LAB — URINALYSIS, ROUTINE W REFLEX MICROSCOPIC
Bilirubin Urine: NEGATIVE
Hgb urine dipstick: NEGATIVE
Ketones, ur: NEGATIVE
Leukocytes,Ua: NEGATIVE
Nitrite: NEGATIVE
Specific Gravity, Urine: 1.01 (ref 1.000–1.030)
Total Protein, Urine: NEGATIVE
Urine Glucose: NEGATIVE
Urobilinogen, UA: 0.2 (ref 0.0–1.0)
pH: 5.5 (ref 5.0–8.0)

## 2024-07-25 ENCOUNTER — Ambulatory Visit: Payer: Self-pay | Admitting: Internal Medicine

## 2024-07-28 LAB — URINE CULTURE
MICRO NUMBER:: 17526695
SPECIMEN QUALITY:: ADEQUATE

## 2024-07-29 MED ORDER — NITROFURANTOIN MONOHYD MACRO 100 MG PO CAPS: 100.0000 mg | ORAL_CAPSULE | Freq: Two times a day (BID) | ORAL | 0 refills | Status: AC

## 2024-08-27 ENCOUNTER — Ambulatory Visit: Admitting: Internal Medicine

## 2025-03-29 ENCOUNTER — Ambulatory Visit: Admitting: Internal Medicine

## 2025-04-16 ENCOUNTER — Other Ambulatory Visit (INDEPENDENT_AMBULATORY_CARE_PROVIDER_SITE_OTHER)

## 2025-04-16 ENCOUNTER — Ambulatory Visit (INDEPENDENT_AMBULATORY_CARE_PROVIDER_SITE_OTHER): Admitting: Nurse Practitioner
